# Patient Record
Sex: Female | Born: 1970 | Race: Black or African American | Hispanic: No | Marital: Married | Smoking: Former smoker
Health system: Southern US, Community
[De-identification: ages and names within clinical notes are randomized; demographics above are authoritative.]

## PROBLEM LIST (undated history)

## (undated) DIAGNOSIS — J479 Bronchiectasis, uncomplicated: Secondary | ICD-10-CM

## (undated) DIAGNOSIS — M1990 Unspecified osteoarthritis, unspecified site: Secondary | ICD-10-CM

## (undated) DIAGNOSIS — N189 Chronic kidney disease, unspecified: Secondary | ICD-10-CM

## (undated) DIAGNOSIS — D869 Sarcoidosis, unspecified: Secondary | ICD-10-CM

## (undated) DIAGNOSIS — G4730 Sleep apnea, unspecified: Secondary | ICD-10-CM

## (undated) DIAGNOSIS — E611 Iron deficiency: Secondary | ICD-10-CM

## (undated) HISTORY — DX: Sleep apnea, unspecified: G47.30

## (undated) HISTORY — DX: Unspecified osteoarthritis, unspecified site: M19.90

---

## 1993-02-09 HISTORY — PX: TUBAL LIGATION: SHX77

## 2009-09-15 ENCOUNTER — Emergency Department (HOSPITAL_COMMUNITY): Admission: EM | Admit: 2009-09-15 | Discharge: 2009-09-15 | Payer: Self-pay | Admitting: Emergency Medicine

## 2014-01-30 ENCOUNTER — Encounter: Payer: Self-pay | Admitting: Family Medicine

## 2014-02-12 ENCOUNTER — Encounter: Payer: Self-pay | Admitting: Physician Assistant

## 2014-02-12 ENCOUNTER — Ambulatory Visit (INDEPENDENT_AMBULATORY_CARE_PROVIDER_SITE_OTHER): Payer: 59 | Admitting: Physician Assistant

## 2014-02-12 VITALS — BP 126/78 | HR 76 | Temp 98.6°F | Resp 18 | Ht 65.0 in | Wt 157.0 lb

## 2014-02-12 DIAGNOSIS — Z0000 Encounter for general adult medical examination without abnormal findings: Secondary | ICD-10-CM

## 2014-02-12 DIAGNOSIS — F17200 Nicotine dependence, unspecified, uncomplicated: Secondary | ICD-10-CM

## 2014-02-12 DIAGNOSIS — Z720 Tobacco use: Secondary | ICD-10-CM

## 2014-02-12 DIAGNOSIS — Z23 Encounter for immunization: Secondary | ICD-10-CM

## 2014-02-12 DIAGNOSIS — J988 Other specified respiratory disorders: Secondary | ICD-10-CM

## 2014-02-12 DIAGNOSIS — J22 Unspecified acute lower respiratory infection: Secondary | ICD-10-CM

## 2014-02-12 HISTORY — DX: Nicotine dependence, unspecified, uncomplicated: F17.200

## 2014-02-12 MED ORDER — AZITHROMYCIN 250 MG PO TABS: ORAL_TABLET | ORAL | Status: DC

## 2014-02-12 NOTE — Progress Notes (Signed)
Patient ID: Hailey Ross MRN: [id number], DOB: [birth year], 43 y.o. Date of Encounter: 02/12/2014,   Chief Complaint: Physical (CPE)  HPI: 43 y.o. y/o AA female  here for CPE.   She is also being seen as a new patient to establish care with our office.  She states that she has just been going 20 urgent care as needed for any acute medical issues but otherwise has not had any routine medical provider in many many years.  She is a smoker and states that she does want to quit. Says that she has never used any medication to help her quit in the past. Says that she has tried to quit cold [place] but that did not work. Says that her job has a program that she just signed up for it is supposed to help with smoking cessation. She wants to try this program first but says that if she is unsuccessful with smoking cessation with this then she will follow-up with me.  She reports that she has had a cough productive of thick sputum for the past couple weeks. Has had no fevers or chills. No night sweats. No hemoptysis. Nasal congestion or mucus from the nose.  No other complaints or concerns.   Review of Systems: Consitutional: No fever, chills, fatigue, night sweats, lymphadenopathy. No significant/unexplained weight changes. Eyes: No visual changes, eye redness, or discharge. ENT/Mouth: No ear pain, sore throat, nasal drainage, or sinus pain. Cardiovascular: No chest pressure,heaviness, tightness or squeezing, even with exertion. No increased shortness of breath or dyspnea on exertion.No palpitations, edema, orthopnea, PND. Respiratory: No  hemoptysis, SOB, or wheezing. Gastrointestinal: No anorexia, dysphagia, reflux, pain, nausea, vomiting, hematemesis, diarrhea, constipation, BRBPR, or melena. Breast: No mass, nodules, bulging, or retraction. No skin changes or inflammation. No nipple discharge. No lymphadenopathy. Genitourinary: No dysuria, hematuria, incontinence, vaginal discharge,  pruritis, burning, abnormal bleeding, or pain. Musculoskeletal: No decreased ROM, No joint pain or swelling. No significant pain in neck, back, or extremities. Skin: No rash, pruritis, or concerning lesions. Neurological: No headache, dizziness, syncope, seizures, tremors, memory loss, coordination problems, or paresthesias. Psychological: No anxiety, depression, hallucinations, SI/HI. Endocrine: No polydipsia, polyphagia, polyuria, or known diabetes.No increased fatigue. No palpitations/rapid heart rate. No significant/unexplained weight change. All other systems were reviewed and are otherwise negative.  History reviewed. No pertinent past medical history.   Past Surgical History  Procedure Laterality Date  . Tubal ligation  02/1993    Home Meds:  No outpatient prescriptions prior to visit.   No facility-administered medications prior to visit.    Allergies: No Known Allergies  History   Social History  . Marital Status: Single    Spouse Name: N/A    Number of Children: N/A  . Years of Education: N/A   Occupational History  . Not on file.   Social History Main Topics  . Smoking status: Current Every Day Smoker -- 1.00 packs/day for 20 years    Types: Cigarettes  . Smokeless tobacco: Never Used  . Alcohol Use: 0.0 oz/week    0 Not specified per week     Comment: Rare  . Drug Use: No  . Sexual Activity: Not Currently   Other Topics Concern  . Not on file   Social History Narrative   3 children. 2 are at home. 1 is at college.  Ages 5321, 8722, [age].   At Home: It is her and her 2 children.   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    Family History  Problem Relation Age of Onset  . Hypertension Mother     Physical Exam: Blood pressure 126/78, pulse 76, temperature 98.6 F (37 C), temperature source Oral, resp. rate 18, height 5\' 5"  (1.651 m), weight 157 lb (71.215 kg), last menstrual period 02/07/2014.,  Body mass index is 26.13 kg/(m^2). General: Well developed, well nourished, AAF. Appears in no acute distress. HEENT: Normocephalic, atraumatic. Conjunctiva pink, sclera non-icteric. Pupils 2 mm constricting to 1 mm, round, regular, and equally reactive to light and accomodation. EOMI. Internal auditory canal clear. TMs with good cone of light and without pathology. Nasal mucosa pink. Nares are without discharge. No sinus tenderness. Oral mucosa pink.  Pharynx without exudate.   Neck: Supple. Trachea midline. No thyromegaly. Full ROM. No lymphadenopathy.No Carotid Bruits. Lungs: Clear to auscultation bilaterally without wheezes, rales, or rhonchi. Breathing is of normal effort and unlabored. Cardiovascular: RRR with S1 S2. No murmurs, rubs, or gallops. Distal pulses 2+ symmetrically. No carotid or abdominal bruits. Breast: Symmetrical. No masses. Nipples without discharge. Abdomen: Soft, non-tender, non-distended with normoactive bowel sounds. No hepatosplenomegaly or masses. No rebound/guarding. No CVA tenderness. No hernias.  Genitourinary:  External genitalia without lesions. Vaginal mucosa pink.No discharge present. Cervix pink and without discharge. No cervical tenderness.Normal uterus size. No adnexal mass or tenderness.  Pap smear taken. Musculoskeletal: Full range of motion and 5/5 strength throughout. Without swelling, atrophy, tenderness, crepitus, or warmth. Extremities without clubbing, cyanosis, or edema. Calves supple. Skin: Warm and moist without erythema, ecchymosis, wounds, or rash. Neuro: A+Ox3. CN II-XII grossly intact. Moves all extremities spontaneously. Full sensation throughout. Normal gait. DTR 2+ throughout upper and lower extremities. Finger to nose intact. Psych:  Responds to questions appropriately with a normal affect.   Assessment/Plan:  43 y.o. y/o female here for CPE 1. Visit for preventive health examination  A. Screening Labs: She states that she can return  fasting for lab work within the next week. - CBC with Differential; Future - COMPLETE METABOLIC PANEL WITH GFR; Future - Lipid panel; Future - TSH; Future  B. Pap: - PAP, Thin Prep w/HPV rflx HPV Type 16/18   C. Screening Mammogram: She states that they have screening mammograms at her work once a year and that she will get a mammogram there.  D. DEXA/BMD:  Can wait until closer to age 43 to start this. Only risk factor for osteoporosis is smoking.  E. Colorectal Cancer Screening: Not indicated to start this until age 43.  F. Immunizations:  Influenza: Today I recommended influenza vaccine but she defers. Tetanus: she states that she has not had a tetanus vaccine in > 10 years. She is agreeable to receive this today. Pneumococcal: Given her smoking, she needs a Pneumovax 23. She is agreeable to receive this today. She will need no no other pneumonia vaccine until age 43. Zostavax: discuss at age 43.   2. Smoker See history of present illness regarding cessation.  3. Lower respiratory tract infection - azithromycin (ZITHROMAX) 250 MG tablet; Day 1: Take 2 daily. Days 2-5: Take 1 daily.  Dispense: 6 tablet; Refill: 0 She is to take the Z-Pak as directed. If cough does not resolve after completion of this, then she needs to follow-up and would obtain up to chest x-ray especially given her smoking history.  4. Need for prophylactic vaccination against Streptococcus pneumoniae (pneumococcus) - Pneumococcal polysaccharide vaccine 23-valent greater than or equal to 2yo subcutaneous/IM  5. Need for prophylactic vaccination with  combined diphtheria-tetanus-pertussis (DTP) vaccine - Tdap vaccine greater than or equal to 7yo IM   Signed, [address] [place], [place], [facility] 02/12/2014 3:44 PM

## 2014-02-14 LAB — PAP, THIN PREP W/HPV RFLX HPV TYPE 16/18: HPV DNA High Risk: NOT DETECTED

## 2014-02-17 ENCOUNTER — Other Ambulatory Visit: Payer: 59

## 2014-02-17 DIAGNOSIS — Z0000 Encounter for general adult medical examination without abnormal findings: Secondary | ICD-10-CM

## 2014-02-17 LAB — COMPLETE METABOLIC PANEL WITH GFR
ALBUMIN: 3.3 g/dL — AB (ref 3.5–5.2)
ALT: 14 U/L (ref 0–35)
AST: 26 U/L (ref 0–37)
Alkaline Phosphatase: 130 U/L — ABNORMAL HIGH (ref 39–117)
BUN: 11 mg/dL (ref 6–23)
CALCIUM: 9.1 mg/dL (ref 8.4–10.5)
CHLORIDE: 104 meq/L (ref 96–112)
CO2: 27 meq/L (ref 19–32)
CREATININE: 1.17 mg/dL — AB (ref 0.50–1.10)
GFR, Est African American: 66 mL/min
GFR, Est Non African American: 57 mL/min — ABNORMAL LOW
Glucose, Bld: 77 mg/dL (ref 70–99)
POTASSIUM: 4.6 meq/L (ref 3.5–5.3)
SODIUM: 137 meq/L (ref 135–145)
TOTAL PROTEIN: 7 g/dL (ref 6.0–8.3)
Total Bilirubin: 0.7 mg/dL (ref 0.2–1.2)

## 2014-02-17 LAB — CBC WITH DIFFERENTIAL/PLATELET
Basophils Absolute: 0 10*3/uL (ref 0.0–0.1)
Basophils Relative: 0 % (ref 0–1)
Eosinophils Absolute: 0.2 10*3/uL (ref 0.0–0.7)
Eosinophils Relative: 2 % (ref 0–5)
HCT: 39.4 % (ref 36.0–46.0)
Hemoglobin: 13 g/dL (ref 12.0–15.0)
LYMPHS ABS: 3 10*3/uL (ref 0.7–4.0)
LYMPHS PCT: 29 % (ref 12–46)
MCH: 27.2 pg (ref 26.0–34.0)
MCHC: 33 g/dL (ref 30.0–36.0)
MCV: 82.4 fL (ref 78.0–100.0)
Monocytes Absolute: 0.9 10*3/uL (ref 0.1–1.0)
Monocytes Relative: 9 % (ref 3–12)
NEUTROS PCT: 60 % (ref 43–77)
Neutro Abs: 6.1 10*3/uL (ref 1.7–7.7)
PLATELETS: 328 10*3/uL (ref 150–400)
RBC: 4.78 MIL/uL (ref 3.87–5.11)
RDW: 14.5 % (ref 11.5–15.5)
WBC: 10.2 10*3/uL (ref 4.0–10.5)

## 2014-02-17 LAB — LIPID PANEL
Cholesterol: 176 mg/dL (ref 0–200)
HDL: 34 mg/dL — ABNORMAL LOW (ref >39)
LDL Cholesterol: 120 mg/dL — ABNORMAL HIGH (ref 0–99)
Total CHOL/HDL Ratio: 5.2 Ratio
Triglycerides: 112 mg/dL (ref <150)
VLDL: 22 mg/dL (ref 0–40)

## 2014-02-17 LAB — TSH: TSH: 1.622 u[IU]/mL (ref 0.350–4.500)

## 2014-02-18 ENCOUNTER — Telehealth: Payer: Self-pay | Admitting: Family Medicine

## 2014-02-18 ENCOUNTER — Encounter: Payer: Self-pay | Admitting: Family Medicine

## 2014-02-18 DIAGNOSIS — A5901 Trichomonal vulvovaginitis: Secondary | ICD-10-CM

## 2014-02-18 MED ORDER — METRONIDAZOLE 500 MG PO TABS: 2000.0000 mg | ORAL_TABLET | Freq: Once | ORAL | Status: DC

## 2014-02-18 NOTE — Telephone Encounter (Signed)
Pt aware of lab results and provider recommendations.  Rx to pharmacy.  Pt states she has not had any sexual contact in several years.  [facility]

## 2014-02-18 NOTE — Telephone Encounter (Signed)
-----   Message from Dorena BodoMary B Dixon, PA-C sent at 02/17/2014  8:05 AM EST ----- Pap smear shows trichomonas. Explain to patient that this is sexually transmitted and that she needs to notify any recent sexual partners and they need to be evaluated and treated and they need to abstain from sex until they are treated.  As well, she needs to abstain from sex  until she and partners are treated. She needs to take the following medication to get rid of this infection. Order prescription for metronidazole 500 mg pills Take 4 pills 1 dose Dispense #4 with 0 refills Notify the health department.

## 2015-01-29 ENCOUNTER — Other Ambulatory Visit: Payer: Self-pay | Admitting: Family Medicine

## 2015-01-29 ENCOUNTER — Ambulatory Visit
Admission: RE | Admit: 2015-01-29 | Discharge: 2015-01-29 | Disposition: A | Discharge status: Home / Self Care | Payer: 59 | Source: Ambulatory Visit | Attending: Physician Assistant | Admitting: Physician Assistant

## 2015-01-29 ENCOUNTER — Encounter: Payer: Self-pay | Admitting: Physician Assistant

## 2015-01-29 ENCOUNTER — Ambulatory Visit (INDEPENDENT_AMBULATORY_CARE_PROVIDER_SITE_OTHER): Payer: 59 | Admitting: Physician Assistant

## 2015-01-29 VITALS — BP 106/76 | HR 64 | Temp 98.4°F | Resp 18 | Wt 150.0 lb

## 2015-01-29 DIAGNOSIS — F17200 Nicotine dependence, unspecified, uncomplicated: Secondary | ICD-10-CM

## 2015-01-29 DIAGNOSIS — R05 Cough: Secondary | ICD-10-CM | POA: Diagnosis not present

## 2015-01-29 DIAGNOSIS — R9389 Abnormal findings on diagnostic imaging of other specified body structures: Secondary | ICD-10-CM

## 2015-01-29 DIAGNOSIS — Z720 Tobacco use: Secondary | ICD-10-CM | POA: Diagnosis not present

## 2015-01-29 DIAGNOSIS — R059 Cough, unspecified: Secondary | ICD-10-CM

## 2015-01-29 MED ORDER — BECLOMETHASONE DIPROPIONATE 80 MCG/ACT IN AERS: 1.0000 | INHALATION_SPRAY | Freq: Two times a day (BID) | RESPIRATORY_TRACT | Status: DC

## 2015-01-29 MED ORDER — AZITHROMYCIN 250 MG PO TABS: ORAL_TABLET | ORAL | Status: DC

## 2015-01-29 MED ORDER — ALBUTEROL SULFATE (2.5 MG/3ML) 0.083% IN NEBU: 2.5000 mg | INHALATION_SOLUTION | Freq: Four times a day (QID) | RESPIRATORY_TRACT | Status: DC | PRN

## 2015-01-29 MED ORDER — VARENICLINE TARTRATE 1 MG PO TABS: 1.0000 mg | ORAL_TABLET | Freq: Two times a day (BID) | ORAL | Status: DC

## 2015-01-29 MED ORDER — ALBUTEROL SULFATE HFA 108 (90 BASE) MCG/ACT IN AERS: 2.0000 | INHALATION_SPRAY | Freq: Four times a day (QID) | RESPIRATORY_TRACT | Status: DC | PRN

## 2015-01-29 MED ORDER — VARENICLINE TARTRATE 0.5 MG PO TABS: 0.5000 mg | ORAL_TABLET | Freq: Two times a day (BID) | ORAL | Status: DC

## 2015-01-29 NOTE — Progress Notes (Signed)
Patient ID: Hailey FernsKimberly R Daniels MRN: [id number], DOB: [birth year], 44 y.o. Date of Encounter: @DATE @  Chief Complaint:  Chief Complaint  Patient presents with  . unresolved cough    HPI: 44 y.o. year old AA female  presents with above complaint.   States that she has had this problem ever since her last visit here. I reviewed that her last visit here was 02/12/2014. She says, yes, that's correct-- that this has been going on for almost a year now. She says that she does not have a cough every single day, but, instead, seems to have  "episodes" of coughing. Says that she will go through these episodes of coughing for a couple of days, then it will improve for several days, then recur--and continues off & on, like that. Says that sometimes she is able to cough up some phlegm but not every time. Says that she never goes a whole week without some amount of coughing. Has had no fevers or chills. No hemoptysis. No weight loss. No night sweats.  She does smoke 1 pack per day and has smoked for about 20 years now.  Says that she lives with her mom and son and they both smoke as well.  Says that she works 8 hours a day and is no longer smoking at work. Says that she was able to stop smoking at work on her own. Says that, at work, they have to go outside to smoke and so over the winter when it was very cold, she did not want to go out to smoke so she did not. Once the weather warmed up, she had gotten out of the habit of going out there, so she just continued to not go out to smoke. Therefore,  she knows she can go without it and she is interested in trying to quit.   History reviewed. No pertinent past medical history.   Home Meds: Outpatient Prescriptions Prior to Visit  Medication Sig Dispense Refill  . naproxen sodium (ANAPROX) 220 MG tablet Take 220 mg by mouth as needed.    Marland Kitchen. azithromycin (ZITHROMAX) 250 MG tablet Day 1: Take 2 daily. Days 2-5: Take 1 daily. 6 tablet 0  . metroNIDAZOLE  (FLAGYL) 500 MG tablet Take 4 tablets (2,000 mg total) by mouth once. 4 tablet 0   No facility-administered medications prior to visit.    Allergies: No Known Allergies  Social History   Social History  . Marital Status: Single    Spouse Name: N/A  . Number of Children: N/A  . Years of Education: N/A   Occupational History  . Not on file.   Social History Main Topics  . Smoking status: Current Every Day Smoker -- 1.00 packs/day for 20 years    Types: Cigarettes  . Smokeless tobacco: Never Used  . Alcohol Use: 0.0 oz/week    0 Standard drinks or equivalent per week     Comment: Rare  . Drug Use: No  . Sexual Activity: Not Currently   Other Topics Concern  . Not on file   Social History Narrative   3 children. 2 are at home. 1 is at college.    At Home: It is her and her 2 children.   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    Family History  Problem Relation Age of Onset  . Hypertension Mother      Review of Systems:  See HPI for pertinent ROS. All  other ROS negative.    Physical Exam: Blood pressure 106/76, pulse 64, temperature 98.4 F (36.9 C), temperature source Oral, resp. rate 18, weight 150 lb (68.04 kg)., Body mass index is 24.96 kg/(m^2). General: WNWD AAF. Appears in no acute distress. Neck: Supple. No thyromegaly. No lymphadenopathy. Lungs: Clear bilaterally to auscultation without wheezes, rales, or rhonchi. Breathing is unlabored. Heart: RRR with S1 S2. No murmurs, rubs, or gallops. Musculoskeletal:  Strength and tone normal for age. Extremities/Skin: Warm and dry.  Neuro: Alert and oriented X 3. Moves all extremities spontaneously. Gait is normal. CNII-XII grossly in tact. Psych:  Responds to questions appropriately with a normal affect.     ASSESSMENT AND PLAN:  44 y.o. year old female with   1. Smoker - DG Chest 2 View; Future - varenicline (CHANTIX) 0.5 MG tablet; Take 1 tablet  (0.5 mg total) by mouth 2 (two) times daily.  Dispense: 30 tablet; Refill: 0 - varenicline (CHANTIX CONTINUING MONTH PAK) 1 MG tablet; Take 1 tablet (1 mg total) by mouth 2 (two) times daily.  Dispense: 60 tablet; Refill: 3 - albuterol (PROVENTIL HFA;VENTOLIN HFA) 108 (90 BASE) MCG/ACT inhaler; Inhale 2 puffs into the lungs every 6 (six) hours as needed for wheezing or shortness of breath.  Dispense: 1 Inhaler; Refill: 0  2. Cough - DG Chest 2 View; Future - varenicline (CHANTIX) 0.5 MG tablet; Take 1 tablet (0.5 mg total) by mouth 2 (two) times daily.  Dispense: 30 tablet; Refill: 0 - varenicline (CHANTIX CONTINUING MONTH PAK) 1 MG tablet; Take 1 tablet (1 mg total) by mouth 2 (two) times daily.  Dispense: 60 tablet; Refill: 3 - albuterol (PROVENTIL HFA;VENTOLIN HFA) 108 (90 BASE) MCG/ACT inhaler; Inhale 2 puffs into the lungs every 6 (six) hours as needed for wheezing or shortness of breath.  Dispense: 1 Inhaler; Refill: 0   I gave and reviewed a handout with tips for helping with smoking cessation.  Also told her to discuss the fact that she is going to try to quit smoking with her mom and son. Discussed that it is going to be extremely difficult for her to quit smoking if they are continuing to smoke right around her and keeping cigarettes around her. She is agreeable to go get a chest x-ray today. She is to use albuterol inhaler 2 puffs every 4 hours PRN "cough" Also discussed proper use of the Chantix and she is to start with the starting dose of 0.5 and then when she completes this go up to the continuing Dosepaks. If she has any adverse effects with this and she is to stop the medicine and call us.   Signed, [address] [place], [place], [facility] 01/29/2015 3:01 PM

## 2015-01-30 ENCOUNTER — Telehealth: Payer: Self-pay | Admitting: Physician Assistant

## 2015-01-30 NOTE — Telephone Encounter (Signed)
Pt has a question about a prescription that was given to her yesterday.  Please call [phone]

## 2015-01-30 NOTE — Telephone Encounter (Signed)
Pt called and meds clarified

## 2015-03-04 ENCOUNTER — Ambulatory Visit
Admission: RE | Admit: 2015-03-04 | Discharge: 2015-03-04 | Disposition: A | Discharge status: Home / Self Care | Payer: 59 | Source: Ambulatory Visit | Attending: Physician Assistant | Admitting: Physician Assistant

## 2015-03-04 ENCOUNTER — Other Ambulatory Visit: Payer: Self-pay | Admitting: Physician Assistant

## 2015-03-04 ENCOUNTER — Other Ambulatory Visit: Payer: Self-pay | Admitting: *Deleted

## 2015-03-04 DIAGNOSIS — R05 Cough: Secondary | ICD-10-CM

## 2015-03-04 DIAGNOSIS — R059 Cough, unspecified: Secondary | ICD-10-CM

## 2015-03-04 DIAGNOSIS — R9389 Abnormal findings on diagnostic imaging of other specified body structures: Secondary | ICD-10-CM

## 2015-03-09 ENCOUNTER — Other Ambulatory Visit: Payer: Self-pay | Admitting: Family Medicine

## 2015-03-09 DIAGNOSIS — R9389 Abnormal findings on diagnostic imaging of other specified body structures: Secondary | ICD-10-CM

## 2015-03-09 NOTE — Progress Notes (Signed)
Chest x-ray showing up in over due results folder. At last visit patient had been having cough for one year. Also smoker. Call her remind her to go for the chest x-ray and encourage her to do this.

## 2015-03-18 ENCOUNTER — Ambulatory Visit
Admission: RE | Admit: 2015-03-18 | Discharge: 2015-03-18 | Disposition: A | Discharge status: Home / Self Care | Payer: 59 | Source: Ambulatory Visit | Attending: Physician Assistant | Admitting: Physician Assistant

## 2015-03-18 DIAGNOSIS — R9389 Abnormal findings on diagnostic imaging of other specified body structures: Secondary | ICD-10-CM

## 2015-03-19 ENCOUNTER — Other Ambulatory Visit: Payer: Self-pay | Admitting: Family Medicine

## 2015-03-19 DIAGNOSIS — R053 Chronic cough: Secondary | ICD-10-CM

## 2015-03-19 DIAGNOSIS — R05 Cough: Secondary | ICD-10-CM

## 2015-03-19 DIAGNOSIS — R9389 Abnormal findings on diagnostic imaging of other specified body structures: Secondary | ICD-10-CM

## 2015-03-26 ENCOUNTER — Other Ambulatory Visit: Payer: Self-pay | Admitting: Family Medicine

## 2015-03-26 DIAGNOSIS — R059 Cough, unspecified: Secondary | ICD-10-CM

## 2015-03-26 DIAGNOSIS — R05 Cough: Secondary | ICD-10-CM

## 2015-03-26 DIAGNOSIS — F17200 Nicotine dependence, unspecified, uncomplicated: Secondary | ICD-10-CM

## 2015-03-26 MED ORDER — BECLOMETHASONE DIPROPIONATE 80 MCG/ACT IN AERS: 1.0000 | INHALATION_SPRAY | Freq: Two times a day (BID) | RESPIRATORY_TRACT | Status: DC

## 2015-03-26 MED ORDER — ALBUTEROL SULFATE HFA 108 (90 BASE) MCG/ACT IN AERS: 2.0000 | INHALATION_SPRAY | Freq: Four times a day (QID) | RESPIRATORY_TRACT | Status: DC | PRN

## 2015-03-26 NOTE — Telephone Encounter (Signed)
Medication refilled per protocol. 

## 2015-04-21 ENCOUNTER — Ambulatory Visit (INDEPENDENT_AMBULATORY_CARE_PROVIDER_SITE_OTHER)
Admission: RE | Admit: 2015-04-21 | Discharge: 2015-04-21 | Disposition: A | Discharge status: Home / Self Care | Payer: 59 | Source: Ambulatory Visit | Attending: Pulmonary Disease | Admitting: Pulmonary Disease

## 2015-04-21 ENCOUNTER — Encounter: Payer: Self-pay | Admitting: Pulmonary Disease

## 2015-04-21 ENCOUNTER — Ambulatory Visit (INDEPENDENT_AMBULATORY_CARE_PROVIDER_SITE_OTHER): Payer: 59 | Admitting: Pulmonary Disease

## 2015-04-21 ENCOUNTER — Other Ambulatory Visit (INDEPENDENT_AMBULATORY_CARE_PROVIDER_SITE_OTHER): Payer: 59

## 2015-04-21 VITALS — BP 112/74 | HR 71 | Ht 65.0 in | Wt 163.0 lb

## 2015-04-21 DIAGNOSIS — Z23 Encounter for immunization: Secondary | ICD-10-CM

## 2015-04-21 DIAGNOSIS — R053 Chronic cough: Secondary | ICD-10-CM

## 2015-04-21 DIAGNOSIS — R05 Cough: Secondary | ICD-10-CM

## 2015-04-21 LAB — CBC WITH DIFFERENTIAL/PLATELET
Basophils Absolute: 0 10*3/uL (ref 0.0–0.1)
Basophils Relative: 0.4 % (ref 0.0–3.0)
EOS ABS: 0.1 10*3/uL (ref 0.0–0.7)
Eosinophils Relative: 1 % (ref 0.0–5.0)
HCT: 41 % (ref 36.0–46.0)
HEMOGLOBIN: 13.3 g/dL (ref 12.0–15.0)
Lymphocytes Relative: 34.1 % (ref 12.0–46.0)
Lymphs Abs: 3.9 10*3/uL (ref 0.7–4.0)
MCHC: 32.5 g/dL (ref 30.0–36.0)
MCV: 83.1 fl (ref 78.0–100.0)
MONO ABS: 0.9 10*3/uL (ref 0.1–1.0)
Monocytes Relative: 7.9 % (ref 3.0–12.0)
Neutro Abs: 6.4 10*3/uL (ref 1.4–7.7)
Neutrophils Relative %: 56.6 % (ref 43.0–77.0)
Platelets: 346 10*3/uL (ref 150.0–400.0)
RBC: 4.93 Mil/uL (ref 3.87–5.11)
RDW: 13.7 % (ref 11.5–15.5)
WBC: 11.4 10*3/uL — AB (ref 4.0–10.5)

## 2015-04-21 NOTE — Progress Notes (Deleted)
   Subjective:    Patient ID: Hailey Ross, female    DOB: 01-23-1971, 45 y.o.   MRN: [id number]  HPI    Review of Systems  Constitutional: Positive for unexpected weight change. Negative for fever.  HENT: Positive for congestion and dental problem. Negative for ear pain, nosebleeds, postnasal drip, rhinorrhea, sinus pressure, sneezing, sore throat and trouble swallowing.   Eyes: Negative for redness and itching.  Respiratory: Positive for cough and shortness of breath. Negative for chest tightness and wheezing.   Cardiovascular: Negative for palpitations and leg swelling.  Gastrointestinal: Positive for abdominal pain. Negative for nausea and vomiting.  Genitourinary: Negative for dysuria.  Musculoskeletal: Negative for joint swelling.  Skin: Negative for rash.  Neurological: Negative for headaches.  Hematological: Does not bruise/bleed easily.  Psychiatric/Behavioral: Negative for dysphoric mood. The patient is not nervous/anxious.        Objective:   Physical Exam        Assessment & Plan:

## 2015-04-21 NOTE — Progress Notes (Signed)
Past medical history She  has no past medical history on file.  Past surgical history She  has past surgical history that includes Tubal ligation (02/1993).  Family history Her family history includes Hypertension in her mother.  Social history She  reports that she quit smoking about 4 weeks ago. Her smoking use included Cigarettes. She has a 20 pack-year smoking history. She has never used smokeless tobacco. She reports that she drinks alcohol. She reports that she does not use illicit drugs.  No Known Allergies  Current Outpatient Prescriptions on File Prior to Visit  Medication Sig  . albuterol (PROVENTIL HFA;VENTOLIN HFA) 108 (90 BASE) MCG/ACT inhaler Inhale 2 puffs into the lungs every 6 (six) hours as needed for wheezing or shortness of breath.  . beclomethasone (QVAR) 80 MCG/ACT inhaler Inhale 1 puff into the lungs 2 (two) times daily.  . naproxen sodium (ANAPROX) 220 MG tablet Take 220 mg by mouth as needed.   No current facility-administered medications on file prior to visit.   Review of systems Review of Systems  Constitutional: Positive for unexpected weight change. Negative for fever.  HENT: Positive for congestion and dental problem. Negative for ear pain, nosebleeds, postnasal drip, rhinorrhea, sinus pressure, sneezing, sore throat and trouble swallowing.   Eyes: Negative for redness and itching.  Respiratory: Positive for cough and shortness of breath. Negative for chest tightness and wheezing.   Cardiovascular: Negative for palpitations and leg swelling.  Gastrointestinal: Positive for abdominal pain. Negative for nausea and vomiting.  Genitourinary: Negative for dysuria.  Musculoskeletal: Negative for joint swelling.  Skin: Negative for rash.  Neurological: Negative for headaches.  Hematological: Does not bruise/bleed easily.  Psychiatric/Behavioral: Negative for dysphoric mood. The patient is not nervous/anxious.     Chief Complaint  Patient presents with   . Pulmonary Consult    Referred by Dr Doren Custard for cough. Pt had recent abn CT    Tests CT chest 03/18/15 >> biapical thickening with traction BTX, BTX RML and lingula, subcarinal LAN 1.7 cm, patchy increased interstitial markings  Vital signs BP 112/74 mmHg  Pulse 71  Ht 5' 5" (1.651 m)  Wt 163 lb (73.936 kg)  BMI 27.12 kg/m2  SpO2 97%  History of present illness MINERVA BLUETT is a 45 y.o. female former smoker for evaluation of chronic cough and abnormal CT chest.  She has an extensive history of smoking.  She quit about 4 weeks ago because of progressive cough.  Her cough started several months ago.  She was bringing up clear to yellow sputum.  She denies hemoptysis or fever.  She was losing weight when her cough first started, but her weights has started to pick up again.  She would get some wheezing.  She denied chest pain, sinus congestion, skin rash, or joint swelling.  Her cough has improved since she stopped smoking.  She was prescribed Qvar and Ventolin >> these helped more when she was smoking, but not so much now.  She denies any prior history of pneumonia.  There is no hx of exposure to tuberculosis.  She denies animal/bird exposure.  She is from [place].  She works at [employer].  She used to work in a Therapist, music about 20 years ago.   She denies any recent sick exposures, or travel history.  There is no prior history of asthma, or allergies.   Physical exam  General - No distress ENT - No sinus tenderness, no oral exudate, no LAN, no  thyromegaly, TM clear, pupils equal/reactive Cardiac - s1s2 regular, no murmur, pulses symmetric Chest - faint inspiratory squeaks and crackles, no wheeze Back - No focal tenderness Abd - Soft, non-tender, no organomegaly, + bowel sounds Ext - No edema, clubbing, or cyanosis Neuro - Normal strength, cranial nerves intact Skin - No rashes Psych - Normal mood, and behavior   Ct Chest W  Contrast  03/18/2015  CLINICAL DATA:  Productive cough several months with 10 pound weight loss over 2-3 months and occasional shortness of breath. Completed antibiotics 02/2015. Abnormal chest x-ray November 2016. Active smoker. EXAM: CT CHEST WITH CONTRAST TECHNIQUE: Multidetector CT imaging of the chest was performed during intravenous contrast administration. CONTRAST:  75 mL Isovue-300 IV COMPARISON:  Chest x-ray 01/29/2015 and 03/04/2015 FINDINGS: Lungs are adequately inflated and demonstrate mild biapical pleural thickening with moderate biapical traction bronchiectasis left worse than right. There is mild patchy peripheral increased interstitial markings over the mid to lower lungs with mild patchy hazy attenuation over the mid to lower lungs with additional areas of bronchiectasis over the right middle lobe and lingula. No evidence of effusion. Subtle focal opacification over the posterior right lower lobe. Central airways are within normal. Heart is normal in size. Mild ectasia of the ascending thoracic aorta measuring 3.6 cm in AP diameter. Minimal calcified plaque over the aortic arch. Pulmonary arterial system is within normal. There is mild mediastinal bilateral hilar adenopathy with the largest node in the subcarinal region measuring 1.7 cm by short axis. No axillary adenopathy. Remaining mediastinal structures are within normal. Images through the upper abdomen demonstrate evidence of cholelithiasis. There is a sub cm hypodensity over the mid to lower pole cortex of the left kidney likely a cyst but too small to characterize. There is mild low density heterogeneity of the spleen which may be perfusion related although cannot exclude underlying infiltrative process. IMPRESSION: Patchy low-attenuation over the mid to lower lungs with mild peripheral interstitial changes and areas of traction bronchiectasis most prominent over the apices. Focal peripheral opacification over the posterior right lower  lobe. Mediastinal and bilateral hilar adenopathy. Findings may be due to underlying interstitial disease, atypical infection or granulomatous disease such as sarcoidosis. Consider pulmonary consultation as high-resolution chest CT may be helpful for further characterization of this interstitial process. Splenic heterogeneity which may be related to perfusion, although cannot exclude underlying infiltrative process which could be related to the lung disease described above as seen with atypical infection or sarcoidosis. Cholelithiasis. Sub cm left renal cortical hypodensity likely a cyst but too small to characterize. Mild ectasia of the ascending thoracic aorta measuring 3.6 cm in AP diameter. Recommend annual imaging followup by CTA or MRA. This recommendation follows 2010 ACCF/AHA/AATS/ACR/ASA/SCA/SCAI/SIR/STS/SVM Guidelines for the Diagnosis and Management of Patients with Thoracic Aortic Disease. Circulation.2010; 121: J009-F818. Electronically Signed   By: Marin Olp M.D.   On: 03/18/2015 10:36    Lab Results  Component Value Date   WBC 10.2 02/17/2014   HGB 13.0 02/17/2014   HCT 39.4 02/17/2014   MCV 82.4 02/17/2014   PLT 328 02/17/2014    Lab Results  Component Value Date   CREATININE 1.17* 02/17/2014   BUN 11 02/17/2014   NA 137 02/17/2014   K 4.6 02/17/2014   CL 104 02/17/2014   CO2 27 02/17/2014    Lab Results  Component Value Date   ALT 14 02/17/2014   AST 26 02/17/2014   ALKPHOS 130* 02/17/2014   BILITOT 0.7 02/17/2014    Lab  Results  Component Value Date   TSH 1.622 02/17/2014    Discussion She has chronic cough, and CT chest findings as detailed above.  Main concern is that she has history of atypical infection or inflammatory process such as sarcoidosis.  She has history of smoking, and I have encouraged her to maintain her smoking abstinence.  She has not noticed as much benefit from inhaler therapy since she stopped smoking.    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 Plan: - repeat chest xray - check sputum culture, AFB, fungal culture - check quantiferon gold - check ESR, RF, ANA, ACE, HIV, CBC with diff, CMET - will schedule PFTs - can continue Qvar and prn albuterol for now - might need bronchoscopy to further assess   Patient Instructions  Will arrange for chest xray, lab tests, sputum sampling, and pulmonary function testing  Follow up in 3 to 4 weeks with Dr. Halford Chessman or Tammy Parrett     Chesley Mires, MD [place] Pager:  [phone]

## 2015-04-21 NOTE — Patient Instructions (Signed)
Will arrange for chest xray, lab tests, sputum sampling, and pulmonary function testing  Follow up in 3 to 4 weeks with Dr. Craige CottaSood [facility]

## 2015-04-22 LAB — COMPREHENSIVE METABOLIC PANEL
ALK PHOS: 79 U/L (ref 39–117)
ALT: 17 U/L (ref 0–35)
AST: 24 U/L (ref 0–37)
Albumin: 3.8 g/dL (ref 3.5–5.2)
BUN: 19 mg/dL (ref 6–23)
CHLORIDE: 105 meq/L (ref 96–112)
CO2: 27 mEq/L (ref 19–32)
Calcium: 9.5 mg/dL (ref 8.4–10.5)
Creatinine, Ser: 1.24 mg/dL — ABNORMAL HIGH (ref 0.40–1.20)
GFR: 60.16 mL/min (ref >60.00)
GLUCOSE: 85 mg/dL (ref 70–99)
POTASSIUM: 4.2 meq/L (ref 3.5–5.1)
SODIUM: 138 meq/L (ref 135–145)
TOTAL PROTEIN: 7.3 g/dL (ref 6.0–8.3)
Total Bilirubin: 0.3 mg/dL (ref 0.2–1.2)

## 2015-04-22 LAB — HIV ANTIBODY (ROUTINE TESTING W REFLEX): HIV: NONREACTIVE

## 2015-04-30 ENCOUNTER — Telehealth: Payer: Self-pay | Admitting: Pulmonary Disease

## 2015-04-30 DIAGNOSIS — R05 Cough: Secondary | ICD-10-CM

## 2015-04-30 DIAGNOSIS — R053 Chronic cough: Secondary | ICD-10-CM

## 2015-04-30 NOTE — Telephone Encounter (Signed)
Please inform pt that lab did not send quantiferon gold, ACE level, rheumatoid factor, ANA, sedimentation rate as was ordered.  She will need to come in for additional lab draw to send for these tests.  Please also confirm that she will be sending sputum specimens for culture, AFB, and fungal culture.

## 2015-05-01 ENCOUNTER — Other Ambulatory Visit (INDEPENDENT_AMBULATORY_CARE_PROVIDER_SITE_OTHER): Payer: 59

## 2015-05-01 DIAGNOSIS — R05 Cough: Secondary | ICD-10-CM | POA: Diagnosis not present

## 2015-05-01 DIAGNOSIS — R053 Chronic cough: Secondary | ICD-10-CM

## 2015-05-01 LAB — SEDIMENTATION RATE: Sed Rate: 32 mm/hr — ABNORMAL HIGH (ref 0–22)

## 2015-05-01 NOTE — Telephone Encounter (Signed)
Noted  

## 2015-05-01 NOTE — Telephone Encounter (Signed)
Pt to come by and have the missed labs drawn today.  Pt also states that she is going to bring her sputum sample by on Monday.  Orders corrected and are ready to be drawn. Will send to [facility] as FYI. Nothing further needed.

## 2015-05-03 LAB — ANA W/REFLEX: ANA: NEGATIVE

## 2015-05-03 LAB — RHEUMATOID FACTOR: Rhuematoid fact SerPl-aCnc: <10 IU/mL (ref 0.0–13.9)

## 2015-05-03 LAB — ANGIOTENSIN CONVERTING ENZYME: ANGIO CONVERT ENZYME: 195 U/L — AB (ref 14–82)

## 2015-05-04 ENCOUNTER — Telehealth: Payer: Self-pay | Admitting: Pulmonary Disease

## 2015-05-04 DIAGNOSIS — J479 Bronchiectasis, uncomplicated: Secondary | ICD-10-CM | POA: Insufficient documentation

## 2015-05-04 DIAGNOSIS — D869 Sarcoidosis, unspecified: Secondary | ICD-10-CM | POA: Insufficient documentation

## 2015-05-04 MED ORDER — PREDNISONE 20 MG PO TABS: 40.0000 mg | ORAL_TABLET | Freq: Every day | ORAL | Status: DC

## 2015-05-04 NOTE — Telephone Encounter (Signed)
CMP Latest Ref Rng 04/21/2015 02/17/2014  Glucose 70 - 99 mg/dL 85 77  BUN 6 - 23 mg/dL 19 11  Creatinine 0.40 - 1.20 mg/dL 1.24(H) 1.17(H)  Sodium 135 - 145 mEq/L 138 137  Potassium 3.5 - 5.1 mEq/L 4.2 4.6  Chloride 96 - 112 mEq/L 105 104  CO2 19 - 32 mEq/L 27 27  Calcium 8.4 - 10.5 mg/dL 9.5 9.1  Total Protein 6.0 - 8.3 g/dL 7.3 7.0  Total Bilirubin 0.2 - 1.2 mg/dL 0.3 0.7  Alkaline Phos 39 - 117 U/L 79 130(H)  AST 0 - 37 U/L 24 26  ALT 0 - 35 U/L 17 14    CBC Latest Ref Rng 04/21/2015 02/17/2014  WBC 4.0 - 10.5 K/uL 11.4(H) 10.2  Hemoglobin 12.0 - 15.0 g/dL 13.3 13.0  Hematocrit 36.0 - 46.0 % 41.0 39.4  Platelets 150.0 - 400.0 K/uL 346.0 328    Labs 04/21/15 >> HIV negative, ESR 32, ACE 195, RF < 10, ANA negative  Results d/w pt.  Explained that her symptoms, CT chest findings, and lab findings are very suggestive of sarcoidosis.  Will give her trial of prednisone 40 mg daily.  She has ROV on 05/25/14 with Tammy Parrett.  Explained that if her symptoms do not improve with steroid therapy, that she would then need bronchoscopy with biopsy versus VATS lung biopsy.

## 2015-05-05 LAB — QUANTIFERON IN TUBE
QFT TB AG MINUS NIL VALUE: 0 IU/mL
QUANTIFERON TB AG VALUE: 0.08 IU/mL
QUANTIFERON TB GOLD: NEGATIVE
Quantiferon Nil Value: 0.08 IU/mL

## 2015-05-05 LAB — QUANTIFERON TB GOLD ASSAY (BLOOD)

## 2015-05-26 ENCOUNTER — Ambulatory Visit (INDEPENDENT_AMBULATORY_CARE_PROVIDER_SITE_OTHER): Payer: 59 | Admitting: Adult Health

## 2015-05-26 ENCOUNTER — Telehealth: Payer: Self-pay | Admitting: Pulmonary Disease

## 2015-05-26 ENCOUNTER — Ambulatory Visit (INDEPENDENT_AMBULATORY_CARE_PROVIDER_SITE_OTHER): Payer: 59 | Admitting: Pulmonary Disease

## 2015-05-26 ENCOUNTER — Encounter: Payer: Self-pay | Admitting: Adult Health

## 2015-05-26 VITALS — BP 128/72 | HR 58 | Ht 65.0 in | Wt 172.8 lb

## 2015-05-26 DIAGNOSIS — Z23 Encounter for immunization: Secondary | ICD-10-CM

## 2015-05-26 DIAGNOSIS — D869 Sarcoidosis, unspecified: Secondary | ICD-10-CM

## 2015-05-26 LAB — PULMONARY FUNCTION TEST
DL/VA % PRED: 73 %
DL/VA: 3.61 ml/min/mmHg/L
DLCO COR: 9.38 ml/min/mmHg
DLCO cor % pred: 36 %
DLCO unc % pred: 36 %
DLCO unc: 9.41 ml/min/mmHg
FEF 25-75 Post: 2.58 L/sec
FEF 25-75 Pre: 1.87 L/sec
FEF2575-%CHANGE-POST: 38 %
FEF2575-%PRED-PRE: 68 %
FEF2575-%Pred-Post: 95 %
FEV1-%Change-Post: 10 %
FEV1-%PRED-PRE: 59 %
FEV1-%Pred-Post: 66 %
FEV1-POST: 1.67 L
FEV1-PRE: 1.51 L
FEV1FVC-%Change-Post: 3 %
FEV1FVC-%Pred-Pre: 103 %
FEV6-%CHANGE-POST: 5 %
FEV6-%PRED-PRE: 58 %
FEV6-%Pred-Post: 62 %
FEV6-POST: 1.88 L
FEV6-PRE: 1.78 L
FEV6FVC-%Change-Post: 0 %
FEV6FVC-%PRED-POST: 102 %
FEV6FVC-%PRED-PRE: 103 %
FVC-%CHANGE-POST: 6 %
FVC-%PRED-POST: 60 %
FVC-%PRED-PRE: 57 %
FVC-POST: 1.88 L
FVC-PRE: 1.78 L
POST FEV6/FVC RATIO: 100 %
PRE FEV1/FVC RATIO: 85 %
Post FEV1/FVC ratio: 88 %
Pre FEV6/FVC Ratio: 100 %
RV % PRED: 44 %
RV: 0.77 L
TLC % PRED: 53 %
TLC: 2.78 L

## 2015-05-26 NOTE — Assessment & Plan Note (Signed)
Abnormal CT with increased interstitial markings, elevated ACE level PFT shows restrictive patten.  She is improved with steroid challenge.  Will slowly taper steroids and hold at  daily .  Return in 4-6 weeks and discuss possible FOB   Plan  Decrease Prednisone  daily for 1 week then  daily for 1 week then  daily -hold at this dose.  Follow up with Dr. Craige Cotta  In 6 weeks and As needed

## 2015-05-26 NOTE — Patient Instructions (Addendum)
Decrease Prednisone  daily for 1 week then  daily for 1 week then  daily -hold at this dose.  Follow up with Dr. Craige Cotta  In 6 weeks and As needed

## 2015-05-26 NOTE — Progress Notes (Signed)
PFT done today. 

## 2015-05-26 NOTE — Progress Notes (Signed)
Subjective:    Patient ID: Hailey Ross, female    DOB: 19-Feb-1971, 45 y.o.   MRN: [id number]  HPI 45 yo female former smoker seen for pulmonary consult 1/101/7 for chronic cough and abnormal CT chest   Tests CT chest 03/18/15 >> biapical thickening with traction BTX, BTX RML and lingula, subcarinal LAN 1.7 cm, patchy increased interstitial markings Labs 04/21/15 >> HIV negative, ESR 32, ACE 195, RF < 10, ANA negative  05/26/2015 Follow up : Cough /Abnormal CT ? Sarcoid  Pt presents for a 3 week follow up .  She was seen last ov for pulmonary consult for cough and abnormal CT chest . CT showed traction bronchiectasis and patchy interstitial markings. Labs showed nml Ra factor and ANA . ACE was high at 195. Marland Kitchen Felt she may have possible Sarcoid . She was started on Prednisone 39m . She says she is feeling much better ., cough is almost gone.  Has decreased sob. Denies chest pain, orthopnea, edema or fever or rash.  PFT done today, FEV1 66%, ratio 88, FVC 60%, DLCO 36%. No sgin BD response .   Patient Active Problem List   Diagnosis Date Noted  . [place]oidosis ([place] 05/04/2015  . [place]asis ([facility] 05/04/2015  . Smoker 02/12/2014    Current Outpatient Prescriptions on File Prior to Visit  Medication Sig Dispense Refill  . albuterol (PROVENTIL HFA;VENTOLIN HFA) 108 (90 BASE) MCG/ACT inhaler Inhale 2 puffs into the lungs every 6 (six) hours as needed for wheezing or shortness of breath. 18 g 4  . beclomethasone (QVAR) 80 MCG/ACT inhaler Inhale 1 puff into the lungs 2 (two) times daily. 8.7 g 5  . predniSONE (DELTASONE) 20 MG tablet Take 2 tablets (40 mg total) by mouth daily with breakfast. 60 tablet 2   No current facility-administered medications on file prior to visit.     Review of Systems Constitutional:   No  weight loss, night sweats,  Fevers, chills, fatigue, or  lassitude.  HEENT:   No headaches,  Difficulty swallowing,  Tooth/dental problems, or  Sore throat,          No sneezing, itching, ear ache, nasal congestion, post nasal drip,   CV:  No chest pain,  Orthopnea, PND, swelling in lower extremities, anasarca, dizziness, palpitations, syncope.   GI  No heartburn, indigestion, abdominal pain, nausea, vomiting, diarrhea, change in bowel habits, loss of appetite, bloody stools.   Resp:   No chest wall deformity  Skin: no rash or lesions.  GU: no dysuria, change in color of urine, no urgency or frequency.  No flank pain, no hematuria   MS:  No joint pain or swelling.  No decreased range of motion.  No back pain.  Psych:  No change in mood or affect. No depression or anxiety.  No memory loss.         Objective:   Physical Exam Filed Vitals:   05/26/15 1610  BP: 128/72  Pulse: 58  Height: 5' 5"  (1.651 m)  Weight: 172 lb 12.8 oz (78.382 kg)  SpO2: 95%   GEN: A/Ox3; pleasant , NAD, well nourished   HEENT:  [place]/AT,  EACs-clear, TMs-wnl, NOSE-clear, THROAT-clear, no lesions, no postnasal drip or exudate noted.   NECK:  Supple w/ fair ROM; no JVD; normal carotid impulses w/o bruits; no thyromegaly or nodules palpated; no lymphadenopathy.  RESP  Clear  P & A; w/o, wheezes/ rales/ or rhonchi.no accessory muscle use, no dullness to percussion  CARD:  RRR,  no m/r/g  , no peripheral edema, pulses intact, no cyanosis or clubbing.  GI:   Soft & nt; nml bowel sounds; no organomegaly or masses detected.  Musco: Warm bil, no deformities or joint swelling noted.   Neuro: alert, no focal deficits noted.    Skin: Warm, no lesions or rashes         Assessment & Plan:

## 2015-05-26 NOTE — Telephone Encounter (Signed)
Pt saw TP today and was told to follow up with Dr. Craige Cotta in 6 weeks. Dr. Craige Cotta next available is in June. Please advise Dr. Craige Cotta if okay to overbook?

## 2015-05-27 NOTE — Telephone Encounter (Signed)
Okay to double book visit with me. 

## 2015-05-27 NOTE — Telephone Encounter (Signed)
Called spoke with pt and appt scheduled for 07/14/15. Nothing further needed

## 2015-05-27 NOTE — Progress Notes (Signed)
Reviewed and agree with assessment/plan. 

## 2015-06-01 ENCOUNTER — Telehealth: Payer: Self-pay | Admitting: Pulmonary Disease

## 2015-06-01 MED ORDER — PREDNISONE 10 MG PO TABS: ORAL_TABLET | ORAL | Status: DC

## 2015-06-01 NOTE — Telephone Encounter (Signed)
Per 05/26/15 OV: Patient Instructions       Decrease Prednisone  daily for 1 week then  daily for 1 week then  daily -hold at this dose.   Follow up with Dr. Craige Cotta  In 6 weeks and As needed   Called spoke with pt. She needed the prednisone RX sent in. I have done so. Nothing further needed

## 2015-07-14 ENCOUNTER — Ambulatory Visit: Payer: 59 | Admitting: Pulmonary Disease

## 2015-07-14 ENCOUNTER — Encounter: Payer: Self-pay | Admitting: Pulmonary Disease

## 2015-07-14 ENCOUNTER — Ambulatory Visit (INDEPENDENT_AMBULATORY_CARE_PROVIDER_SITE_OTHER): Payer: 59 | Admitting: Pulmonary Disease

## 2015-07-14 VITALS — BP 110/80 | HR 82 | Ht 65.0 in | Wt 180.4 lb

## 2015-07-14 DIAGNOSIS — J45909 Unspecified asthma, uncomplicated: Secondary | ICD-10-CM

## 2015-07-14 DIAGNOSIS — R058 Other specified cough: Secondary | ICD-10-CM

## 2015-07-14 DIAGNOSIS — D860 Sarcoidosis of lung: Secondary | ICD-10-CM | POA: Diagnosis not present

## 2015-07-14 DIAGNOSIS — R05 Cough: Secondary | ICD-10-CM

## 2015-07-14 MED ORDER — TRIAMCINOLONE ACETONIDE 55 MCG/ACT NA AERO: 2.0000 | INHALATION_SPRAY | Freq: Every day | NASAL | Status: DC

## 2015-07-14 MED ORDER — PREDNISONE 10 MG PO TABS: ORAL_TABLET | ORAL | Status: DC

## 2015-07-14 NOTE — Progress Notes (Signed)
Current Outpatient Prescriptions on File Prior to Visit  Medication Sig  . albuterol (PROVENTIL HFA;VENTOLIN HFA) 108 (90 BASE) MCG/ACT inhaler Inhale 2 puffs into the lungs every 6 (six) hours as needed for wheezing or shortness of breath.  . beclomethasone (QVAR) 80 MCG/ACT inhaler Inhale 1 puff into the lungs 2 (two) times daily.   No current facility-administered medications on file prior to visit.     Chief Complaint  Patient presents with  . Follow-up    Pt notes some improvement since last OV. Pt reports cough, SOB and wheezing only pressent with over-exertion.     Tests CT chest 03/18/15 >> biapical thickening with traction BTX, BTX RML and lingula, subcarinal LAN 1.7 cm, patchy increased interstitial markings Labs 04/21/15 >> HIV negative, Quantiferon gold negative, ACE 195, ANA, RF negative, ESR 32 PFT 05/26/15 >> FEV1 1.67 (66%), FEV1% 88, TLC 2/78 (53%), DLCO 36%   Past medical hx, Past surgical hx, Allergies, Family hx, Social hx all reviewed.  Vital Signs BP 110/80 mmHg  Pulse 82  Ht 5' 5"  (1.651 m)  Wt 180 lb 6.4 oz (81.829 kg)  BMI 30.02 kg/m2  SpO2 96%  History of Present Illness Hailey Ross is a 45 y.o. 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  She is doing better.  She only gets winded if she runs.  She is not having wheeze, chest pain, skin rash.  She has noticed sinus congestion and post nasal drip for last week.  She has not needed to use albuterol.  She is using Qvar bid.  She is down to 10 mg prednisone daily.  Physical Exam  General - No distress ENT - No sinus tenderness, no oral exudate, no LAN Cardiac - s1s2 regular, no murmur Chest - No wheeze/rales/dullness Back - No focal tenderness Abd - Soft, non-tender Ext - No edema Neuro - Normal strength Skin - No rashes Psych - normal mood, and behavior   Assessment/Plan  Pulmonary sarcoidosis. - continue to wean prednisone  Asthmatic bronchitis. - continue Qvar with prn  albuterol  Upper airway cough with post nasal drip. - nasacort prn   Patient Instructions  Prednisone 10 mg pills >> 1 pill daily for 2 weeks, then 1/2 pill daily for 2 weeks, then 1/2 pill every other day for 2 weeks, then stop prednisone  Nasacort two sprays in each nostril daily as needed for sinus congestion and post nasal drip  Follow up in 3 months     Chesley Mires, MD [place] [facility]Critical [facility]/Sleep Pager:  [phone] 07/14/2015

## 2015-07-14 NOTE — Patient Instructions (Signed)
Prednisone 10 mg pills >> 1 pill daily for 2 weeks, then 1/2 pill daily for 2 weeks, then 1/2 pill every other day for 2 weeks, then stop prednisone  Nasacort two sprays in each nostril daily as needed for sinus congestion and post nasal drip  Follow up in 3 months

## 2015-08-25 ENCOUNTER — Encounter (HOSPITAL_COMMUNITY): Payer: Self-pay | Admitting: Emergency Medicine

## 2015-08-25 ENCOUNTER — Emergency Department (HOSPITAL_COMMUNITY)
Admission: EM | Admit: 2015-08-25 | Discharge: 2015-08-25 | Disposition: A | Discharge status: Home / Self Care | Payer: 59 | Attending: Emergency Medicine | Admitting: Emergency Medicine

## 2015-08-25 DIAGNOSIS — Z87891 Personal history of nicotine dependence: Secondary | ICD-10-CM | POA: Diagnosis not present

## 2015-08-25 DIAGNOSIS — M545 Low back pain, unspecified: Secondary | ICD-10-CM

## 2015-08-25 DIAGNOSIS — Z79899 Other long term (current) drug therapy: Secondary | ICD-10-CM | POA: Diagnosis not present

## 2015-08-25 HISTORY — DX: Sarcoidosis, unspecified: D86.9

## 2015-08-25 MED ORDER — IBUPROFEN 800 MG PO TABS: 800.0000 mg | ORAL_TABLET | Freq: Three times a day (TID) | ORAL | Status: DC

## 2015-08-25 MED ORDER — METHOCARBAMOL 500 MG PO TABS: 500.0000 mg | ORAL_TABLET | Freq: Two times a day (BID) | ORAL | Status: DC | PRN

## 2015-08-25 MED ORDER — KETOROLAC TROMETHAMINE 60 MG/2ML IM SOLN
60.0000 mg | Freq: Once | INTRAMUSCULAR | Status: AC
  Administered 2015-08-25: 60 mg via INTRAMUSCULAR
  Filled 2015-08-25: qty 2

## 2015-08-25 NOTE — ED Notes (Signed)
Pt alert & oriented x4, stable gait. Patient given discharge instructions, paperwork & prescription(s). Patient  instructed to stop at the registration desk to finish any additional paperwork. Patient verbalized understanding. Pt left department w/ no further questions. 

## 2015-08-25 NOTE — ED Notes (Signed)
Pt states back pain for the past few days. Took a day off work & was feeling better. Pain worse after working. 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

## 2015-08-25 NOTE — ED Provider Notes (Signed)
CSN: [id number]     Arrival date & time 08/25/15  0007 History   First MD Initiated Contact with Patient 08/25/15 0051     Chief Complaint  Patient presents with  . Back Pain     (Consider location/radiation/quality/duration/timing/severity/associated sxs/prior Treatment) HPI  The pt has had back pain for 4 days - started at work - then went awy and came back next day Is bilateral lower back, reports no neuro sx. No urinary sx and no red flags for back pain - worse with staying still.  Sx are constant, worse with bending over.  No meds pta.    Past Medical History  Diagnosis Date  . Sarcoidosis [facility])    Past Surgical History  Procedure Laterality Date  . Tubal ligation  02/1993   Family History  Problem Relation Age of Onset  . Hypertension Mother    Social History  Substance Use Topics  . Smoking status: Former Smoker -- 1.00 packs/day for 20 years    Types: Cigarettes    Quit date: 03/21/2015  . Smokeless tobacco: Never Used  . Alcohol Use: 0.0 oz/week    0 Standard drinks or equivalent per week     Comment: Rare   OB History    No data available     Review of Systems  Constitutional: Negative for fever and chills.  Cardiovascular: Negative for leg swelling.  Gastrointestinal: Negative for nausea and vomiting.       No incontinence of bowel  Genitourinary: Negative for difficulty urinating.       No incontinence or retention  Musculoskeletal: Positive for back pain. Negative for neck pain.  Skin: Negative for rash.  Neurological: Negative for weakness and numbness.      Allergies  Review of patient's allergies indicates no known allergies.  Home Medications   Prior to Admission medications   Medication Sig Start Date End Date Taking? Authorizing Provider  albuterol (PROVENTIL HFA;VENTOLIN HFA) 108 (90 BASE) MCG/ACT inhaler Inhale 2 puffs into the lungs every 6 (six) hours as needed for wheezing or shortness of breath. 03/26/15  Yes Dorena Bodo, PA-C   beclomethasone (QVAR) 80 MCG/ACT inhaler Inhale 1 puff into the lungs 2 (two) times daily. 03/26/15  Yes Mary B Dixon, PA-C  predniSONE (DELTASONE) 10 MG tablet 1 pill daily for 2 weeks, then 1/2 pill daily for 2 weeks, then 1/2 pill every other day for 2 weeks 07/14/15  Yes Coralyn Helling, MD  triamcinolone (NASACORT AQ) 55 MCG/ACT AERO nasal inhaler Place 2 sprays into the nose daily. 07/14/15  Yes Coralyn Helling, MD  ibuprofen (ADVIL,MOTRIN) 800 MG tablet Take 1 tablet (800 mg total) by mouth 3 (three) times daily. 08/25/15   Eber Hong, MD  methocarbamol (ROBAXIN) 500 MG tablet Take 1 tablet (500 mg total) by mouth 2 (two) times daily as needed for muscle spasms. 08/25/15   Eber Hong, MD   BP 164/94 mmHg  Pulse 59  Temp(Src) 98.1 F (36.7 C)  Resp 18  Ht  (1.651 m)  Wt 178 lb (80.74 kg)  BMI 29.62 kg/m2  SpO2 100%  LMP 08/17/2015 Physical Exam  Constitutional: She appears well-developed and well-nourished. No distress.  HENT:  Head: Normocephalic and atraumatic.  Eyes: Conjunctivae are normal. Right eye exhibits no discharge. Left eye exhibits no discharge. No scleral icterus.  Cardiovascular: Normal rate and regular rhythm.   Pulmonary/Chest: Effort normal and breath sounds normal.  Musculoskeletal: She exhibits no edema.  Tenderness of the back over the  bilateral paraspinal muslces of the Lumbar back No tenderness over the Cervical, Thoracic or Lumbar Spine  Neurological:  Speech is clear, strength in the UE and LE's are normal at the major muscle groups including the hip, knee and ankles.  Sensation in tact to light touch and pin prick of the bilateral LE's.  Normal reflexes at the knees bilaterally.  Gait antalgic secondary to pain (minimal)  Skin: Skin is warm and dry. No rash noted. She is not diaphoretic.    ED Course  Procedures (including critical care time) Labs Review Labs Reviewed - No data to display  Imaging Review No results found. I have personally reviewed  and evaluated these images and lab results as part of my medical decision-making.    MDM   Final diagnoses:  Bilateral low back pain without sciatica    Low back pain - no radicular sx, no signs of spinal stenosis / cauda equina. Pt understands indications for return.  Meds given in ED:  Medications  ketorolac (TORADOL) injection 60 mg (not administered)    New Prescriptions   IBUPROFEN (ADVIL,MOTRIN) 800 MG TABLET    Take 1 tablet (800 mg total) by mouth 3 (three) times daily.   METHOCARBAMOL (ROBAXIN) 500 MG TABLET    Take 1 tablet (500 mg total) by mouth 2 (two) times daily as needed for muscle spasms.      Eber HongBrian Corgan Mormile, MD 08/25/15 [phone]

## 2015-08-25 NOTE — ED Notes (Signed)
Pt c/o lower back pain for a few days.

## 2015-08-25 NOTE — Discharge Instructions (Signed)

## 2015-10-26 ENCOUNTER — Encounter: Payer: Self-pay | Admitting: Pulmonary Disease

## 2015-10-26 ENCOUNTER — Ambulatory Visit (INDEPENDENT_AMBULATORY_CARE_PROVIDER_SITE_OTHER): Payer: 59 | Admitting: Pulmonary Disease

## 2015-10-26 VITALS — BP 122/92 | HR 63 | Ht 65.0 in | Wt 194.4 lb

## 2015-10-26 DIAGNOSIS — D860 Sarcoidosis of lung: Secondary | ICD-10-CM | POA: Diagnosis not present

## 2015-10-26 DIAGNOSIS — J45909 Unspecified asthma, uncomplicated: Secondary | ICD-10-CM | POA: Diagnosis not present

## 2015-10-26 DIAGNOSIS — J479 Bronchiectasis, uncomplicated: Secondary | ICD-10-CM

## 2015-10-26 MED ORDER — PREDNISONE 5 MG PO TABS: 5.0000 mg | ORAL_TABLET | Freq: Every day | ORAL | Status: DC

## 2015-10-26 NOTE — Patient Instructions (Signed)
Prednisone 5 mg daily  Follow up in 3 months

## 2015-10-26 NOTE — Progress Notes (Signed)
Current Outpatient Prescriptions on File Prior to Visit  Medication Sig  . albuterol (PROVENTIL HFA;VENTOLIN HFA) 108 (90 BASE) MCG/ACT inhaler Inhale 2 puffs into the lungs every 6 (six) hours as needed for wheezing or shortness of breath.  . beclomethasone (QVAR) 80 MCG/ACT inhaler Inhale 1 puff into the lungs 2 (two) times daily.  Marland Kitchen ibuprofen (ADVIL,MOTRIN) 800 MG tablet Take 1 tablet (800 mg total) by mouth 3 (three) times daily.  . methocarbamol (ROBAXIN) 500 MG tablet Take 1 tablet (500 mg total) by mouth 2 (two) times daily as needed for muscle spasms.  Marland Kitchen triamcinolone (NASACORT AQ) 55 MCG/ACT AERO nasal inhaler Place 2 sprays into the nose daily.   No current facility-administered medications on file prior to visit.     Chief Complaint  Patient presents with  . Follow-up    Pt denies any current breathing issues. Pt reports decreased endurance with heavier exertional activities     Pulmonary tests CT chest 03/18/15 >> biapical thickening with traction BTX, BTX RML and lingula, subcarinal LAN 1.7 cm, patchy increased interstitial markings Labs 04/21/15 >> HIV negative, Quantiferon gold negative, ACE 195, ANA, RF negative, ESR 32 PFT 05/26/15 >> FEV1 1.67 (66%), FEV1% 88, TLC 2/78 (53%), DLCO 36%   Past medical hx, Past surgical hx, Allergies, Family hx, Social hx all reviewed.  Vital Signs BP 132/100 mmHg  Pulse 63  Ht 5' 5"  (1.651 m)  Wt 194 lb 6.4 oz (88.179 kg)  BMI 32.35 kg/m2  SpO2 94%  History of Present Illness Hailey Ross is a 45 y.o. 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  She noticed more cough when she changed from prednisone 5 mg daily to every other day.  She also notices more trouble with her breathing with lower dose of prednisone.  She is not having sputum, chest pain, fever, or hemoptysis.  She denies skin rash, leg swelling, or gland swelling.    She has been using Qvar.  She doesn't use albuterol much >> this does help when she  uses it.  Physical Exam  General - No distress ENT - No sinus tenderness, no oral exudate, no LAN Cardiac - s1s2 regular, no murmur Chest - No wheeze/rales/dullness Back - No focal tenderness Abd - Soft, non-tender Ext - No edema Neuro - Normal strength Skin - No rashes Psych - normal mood, and behavior   Assessment/Plan  Pulmonary sarcoidosis with bronchiectasis. - will have her change to prednisone 5 mg daily and remain on this dose until next follow up  Asthmatic bronchitis. - continue Qvar with prn albuterol  Upper airway cough with post nasal drip. - nasacort prn   Patient Instructions  Prednisone 5 mg daily  Follow up in 3 months     Chesley Mires, MD [place] [facility]/Critical Care/Sleep Pager:  [phone] 10/26/2015

## 2015-12-21 ENCOUNTER — Telehealth: Payer: Self-pay

## 2015-12-21 NOTE — Telephone Encounter (Signed)
Hailey Ross, can you help Tiffany with figuring out what med they will cover.

## 2015-12-21 NOTE — Telephone Encounter (Signed)
Pharmacy called patients ins will not cover Qvar. Would like to know if you could prescribe something else?

## 2016-01-08 MED ORDER — FLUTICASONE PROPIONATE HFA 220 MCG/ACT IN AERO: 1.0000 | INHALATION_SPRAY | Freq: Two times a day (BID) | RESPIRATORY_TRACT | 12 refills | Status: DC

## 2016-01-08 NOTE — Telephone Encounter (Signed)
Spoke to patient and made her aware of medication changes and recommendations from PCP

## 2016-01-08 NOTE — Telephone Encounter (Signed)
Qvar not covered by insurance.  Per provider change to Flovent HFA 220 mg 1 puff BID.  Rx to pharmacy.  Per provider we need to let pt know that Pulmonary provider needs to follow her for her further refills.  Have left message for pt to call me back.

## 2016-02-01 ENCOUNTER — Ambulatory Visit (INDEPENDENT_AMBULATORY_CARE_PROVIDER_SITE_OTHER): Payer: 59 | Admitting: Pulmonary Disease

## 2016-02-01 ENCOUNTER — Encounter: Payer: Self-pay | Admitting: Pulmonary Disease

## 2016-02-01 ENCOUNTER — Ambulatory Visit (INDEPENDENT_AMBULATORY_CARE_PROVIDER_SITE_OTHER)
Admission: RE | Admit: 2016-02-01 | Discharge: 2016-02-01 | Disposition: A | Discharge status: Home / Self Care | Payer: 59 | Source: Ambulatory Visit | Attending: Pulmonary Disease | Admitting: Pulmonary Disease

## 2016-02-01 ENCOUNTER — Other Ambulatory Visit (INDEPENDENT_AMBULATORY_CARE_PROVIDER_SITE_OTHER): Payer: 59

## 2016-02-01 VITALS — BP 122/84 | HR 69 | Ht 65.0 in | Wt 203.6 lb

## 2016-02-01 DIAGNOSIS — D860 Sarcoidosis of lung: Secondary | ICD-10-CM | POA: Diagnosis not present

## 2016-02-01 DIAGNOSIS — J45909 Unspecified asthma, uncomplicated: Secondary | ICD-10-CM

## 2016-02-01 DIAGNOSIS — J479 Bronchiectasis, uncomplicated: Secondary | ICD-10-CM | POA: Diagnosis not present

## 2016-02-01 LAB — COMPREHENSIVE METABOLIC PANEL
ALBUMIN: 3.8 g/dL (ref 3.5–5.2)
ALK PHOS: 66 U/L (ref 39–117)
ALT: 15 U/L (ref 0–35)
AST: 23 U/L (ref 0–37)
BUN: 16 mg/dL (ref 6–23)
CO2: 27 mEq/L (ref 19–32)
CREATININE: 1.34 mg/dL — AB (ref 0.40–1.20)
Calcium: 9.2 mg/dL (ref 8.4–10.5)
Chloride: 106 mEq/L (ref 96–112)
GFR: 54.82 mL/min — ABNORMAL LOW (ref >60.00)
Glucose, Bld: 82 mg/dL (ref 70–99)
Potassium: 4.1 mEq/L (ref 3.5–5.1)
SODIUM: 137 meq/L (ref 135–145)
TOTAL PROTEIN: 6.9 g/dL (ref 6.0–8.3)
Total Bilirubin: 0.5 mg/dL (ref 0.2–1.2)

## 2016-02-01 LAB — CBC
HEMATOCRIT: 38.8 % (ref 36.0–46.0)
Hemoglobin: 12.9 g/dL (ref 12.0–15.0)
MCHC: 33.3 g/dL (ref 30.0–36.0)
MCV: 81.8 fl (ref 78.0–100.0)
Platelets: 317 10*3/uL (ref 150.0–400.0)
RBC: 4.74 Mil/uL (ref 3.87–5.11)
RDW: 14 % (ref 11.5–15.5)
WBC: 10.5 10*3/uL (ref 4.0–10.5)

## 2016-02-01 LAB — SEDIMENTATION RATE: SED RATE: 21 mm/h — AB (ref 0–20)

## 2016-02-01 NOTE — Progress Notes (Signed)
Current Outpatient Prescriptions on File Prior to Visit  Medication Sig  . albuterol (PROVENTIL HFA;VENTOLIN HFA) 108 (90 BASE) MCG/ACT inhaler Inhale 2 puffs into the lungs every 6 (six) hours as needed for wheezing or shortness of breath.  . fluticasone (FLOVENT HFA) 220 MCG/ACT inhaler Inhale 1 puff into the lungs 2 (two) times daily.  Marland Kitchen ibuprofen (ADVIL,MOTRIN) 800 MG tablet Take 1 tablet (800 mg total) by mouth 3 (three) times daily.  . methocarbamol (ROBAXIN) 500 MG tablet Take 1 tablet (500 mg total) by mouth 2 (two) times daily as needed for muscle spasms.  . predniSONE (DELTASONE) 5 MG tablet Take 1 tablet (5 mg total) by mouth daily with breakfast.  . triamcinolone (NASACORT AQ) 55 MCG/ACT AERO nasal inhaler Place 2 sprays into the nose daily.   No current facility-administered medications on file prior to visit.      Chief Complaint  Patient presents with  . Follow-up    Pt c/o SOB with longer distances, pt states that she gets tired quicker with exertion.      Pulmonary tests CT chest 03/18/15 >> biapical thickening with traction BTX, BTX RML and lingula, subcarinal LAN 1.7 cm, patchy increased interstitial markings Labs 04/21/15 >> HIV negative, Quantiferon gold negative, ACE 195, ANA, RF negative, ESR 32 PFT 05/26/15 >> FEV1 1.67 (66%), FEV1% 88, TLC 2/78 (53%), DLCO 36%   Past medical hx, Past surgical hx, Allergies, Family hx, Social hx all reviewed.  Vital Signs BP 122/84 (BP Location: Left Arm, Cuff Size: Normal)   Pulse 69   Ht 5' 5"  (1.651 m)   Wt 203 lb 9.6 oz (92.4 kg)   SpO2 92%   BMI 33.88 kg/m   History of Present Illness Hailey Ross is a 45 y.o. 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  She has noticed more trouble with exertion since decreasing to 5 mg prednisone per day.  She also gets occasional cough with sputum.  She is not having fever, chest pain, sweats, hemoptysis, skin rash, or leg swelling.  Physical Exam  General -  No distress ENT - No sinus tenderness, no oral exudate, no LAN, wears dentures Cardiac - s1s2 regular, no murmur Chest - No wheeze/rales/dullness Back - No focal tenderness Abd - Soft, non-tender Ext - No edema Neuro - Normal strength Skin - No rashes Psych - normal mood, and behavior   Assessment/Plan  Pulmonary sarcoidosis with bronchiectasis. - increase to 10 mg prednisone daily  - repeat labs and chest xray today - will then decide if she needs additional medications for sarcoidosis  Asthmatic bronchitis. - continue flovent with prn albuterol  Upper airway cough with post nasal drip. - nasacort prn   Patient Instructions  Increase prednisone to 10 mg daily  Chest xray and labs today  Follow up in 4 weeks with Hailey Ross or Nurse Practitioner    Chesley Mires, MD [place] [facility]/Critical Care/Sleep Pager:  [phone] 02/01/2016

## 2016-02-01 NOTE — Patient Instructions (Signed)
Increase prednisone to 10 mg daily  Chest xray and labs today  Follow up in 4 weeks with Dr. Craige CottaSood or Nurse Practitioner

## 2016-02-02 LAB — ANGIOTENSIN CONVERTING ENZYME: ANGIOTENSIN-CONVERTING ENZYME: 166 U/L — AB (ref 9–67)

## 2016-02-03 ENCOUNTER — Other Ambulatory Visit: Payer: Self-pay

## 2016-02-03 MED ORDER — FLUTICASONE PROPIONATE HFA 220 MCG/ACT IN AERO: 1.0000 | INHALATION_SPRAY | Freq: Two times a day (BID) | RESPIRATORY_TRACT | 1 refills | Status: DC

## 2016-02-03 NOTE — Telephone Encounter (Signed)
Pt has not been seen since 01/2015 and would need to have an OV before anymore refills Letter out

## 2016-02-26 ENCOUNTER — Other Ambulatory Visit: Payer: Self-pay | Admitting: Pulmonary Disease

## 2016-02-26 MED ORDER — PREDNISONE 5 MG PO TABS: 10.0000 mg | ORAL_TABLET | Freq: Every day | ORAL | 0 refills | Status: DC

## 2016-02-26 NOTE — Telephone Encounter (Signed)
Refill request received from [facility] for Pred 5mg  refill Last OV patient's does was increased to 10mg  daily.  Called and spoke with patient, pt states that she is still taking 10mg  daily (takes two 5mg  tabs).  Pt aware that I will refill her Pred to [facility] for the 10mg  dose.  This has been sent to [facility]. Nothing further needed.

## 2016-02-29 ENCOUNTER — Encounter: Payer: Self-pay | Admitting: Adult Health

## 2016-02-29 ENCOUNTER — Ambulatory Visit (INDEPENDENT_AMBULATORY_CARE_PROVIDER_SITE_OTHER): Payer: 59 | Admitting: Adult Health

## 2016-02-29 DIAGNOSIS — D869 Sarcoidosis, unspecified: Secondary | ICD-10-CM | POA: Diagnosis not present

## 2016-02-29 NOTE — Assessment & Plan Note (Signed)
Suspect flare with high ACE level  CXR was stable  Will try higher level of steroids and slow taper to 10mg  daily . Hold at this dose Consider ACE level on return  Will need to look at steroid sparing  sarcoid treatment  If unable to get of prednisone .   Plan  Patient Instructions  Increase prednisone to 20mg  daily for 2 weeks then 10mg  daily and hold at this dose .  Follow up with Dr. Craige CottaSood  In 6 weeks and As needed   Please contact office for sooner follow up if symptoms do not improve or worsen or seek emergency care

## 2016-02-29 NOTE — Progress Notes (Signed)
Subjective:    Patient ID: Hailey Ross, female    DOB: 02/22/1971, 45 y.o.   MRN: [id number]  HPI 45 yo female former smoker seen for pulmonary consult 1/101/7 for chronic cough and abnormal CT chest   Tests CT chest 03/18/15 >> biapical thickening with traction BTX, BTX RML and lingula, subcarinal LAN 1.7 cm, patchy increased interstitial markings Labs 04/21/15 >> HIV negative, ESR 32, ACE 195, RF < 10, ANA negative PFT 05/26/15 >> FEV1 1.67 (66%), FEV1% 88, TLC 2/78 (53%), DLCO 36%   02/29/2016 Follow up : Cough /Abnormal CT ? Sarcoid  Pt presents for a 4 week follow up .  She was seen last for cough and doe  flare . Felt to have Sarcoid flare with ACE level remains  very high at 166.  CXR showed stable interstitial densities. Prednisone was increased to 51m daily . Says she was sx free on higher dose of steroids. Once she got to 539msx of doe/cough came back .   Denies chest pain, orthopnea, edema or fever or rash.    Patient Active Problem List   Diagnosis Date Noted  . S[place]oidosis ([facility] [place]/23/2017  . Bronchiectasis ([facility]/23/2017  . Smoker 02/12/2014    Current Outpatient Prescriptions on File Prior to Visit  Medication Sig Dispense Refill  . albuterol (PROVENTIL HFA;VENTOLIN HFA) 108 (90 BASE) MCG/ACT inhaler Inhale 2 puffs into the lungs every 6 (six) hours as needed for wheezing or shortness of breath. 18 g 4  . fluticasone (FLOVENT HFA) 220 MCG/ACT inhaler Inhale 1 puff into the lungs 2 (two) times daily. Patient will need an office before anymore refills 1 Inhaler 1  . predniSONE (DELTASONE) 5 MG tablet Take 2 tablets (10 mg total) by mouth daily with breakfast. 180 tablet 0  . triamcinolone (NASACORT AQ) 55 MCG/ACT AERO nasal inhaler Place 2 sprays into the nose daily. 1 Inhaler 12  . ibuprofen (ADVIL,MOTRIN) 800 MG tablet Take 1 tablet (800 mg total) by mouth 3 (three) times daily. (Patient not taking: Reported on 02/29/2016) 21 tablet 0   No current  facility-administered medications on file prior to visit.      Review of Systems Constitutional:   No  weight loss, night sweats,  Fevers, chills, + fatigue, or  lassitude.  HEENT:   No headaches,  Difficulty swallowing,  Tooth/dental problems, or  Sore throat,                No sneezing, itching, ear ache, nasal congestion, post nasal drip,   CV:  No chest pain,  Orthopnea, PND, swelling in lower extremities, anasarca, dizziness, palpitations, syncope.   GI  No heartburn, indigestion, abdominal pain, nausea, vomiting, diarrhea, change in bowel habits, loss of appetite, bloody stools.   Resp:   No chest wall deformity  Skin: no rash or lesions.  GU: no dysuria, change in color of urine, no urgency or frequency.  No flank pain, no hematuria   MS:  No joint pain or swelling.  No decreased range of motion.  No back pain.  Psych:  No change in mood or affect. No depression or anxiety.  No memory loss.         Objective:   Physical Exam Vitals:   02/29/16 1506  BP: 118/70  Pulse: 73  Temp: 98.2 F (36.8 C)  TempSrc: Oral  SpO2: 100%  Weight: 204 lb 12.8 oz (92.9 kg)  Height: 5' 5"  (1.651 m)   GEN: A/Ox3; pleasant , NAD,  well nourished    HEENT:  [place]/AT,  EACs-clear, TMs-wnl, NOSE-clear, THROAT-clear, no lesions, no postnasal drip or exudate noted.   NECK:  Supple w/ fair ROM; no JVD; normal carotid impulses w/o bruits; no thyromegaly or nodules palpated; no lymphadenopathy.    RESP  Clear  P & A; w/o, wheezes/ rales/ or rhonchi. no accessory muscle use, no dullness to percussion  CARD:  RRR, no m/r/g  , no peripheral edema, pulses intact, no cyanosis or clubbing.  GI:   Soft & nt; nml bowel sounds; no organomegaly or masses detected.   Musco: Warm bil, no deformities or joint swelling noted.   Neuro: alert, no focal deficits noted.    Skin: Warm, no lesions or rashes         Assessment & Plan:

## 2016-02-29 NOTE — Patient Instructions (Signed)
Increase prednisone to 20mg  daily for 2 weeks then 10mg  daily and hold at this dose .  Follow up with Dr. Craige CottaSood  In 6 weeks and As needed   Please contact office for sooner follow up if symptoms do not improve or worsen or seek emergency care

## 2016-03-01 ENCOUNTER — Telehealth: Payer: Self-pay | Admitting: Pulmonary Disease

## 2016-03-01 MED ORDER — PREDNISONE 5 MG PO TABS: 10.0000 mg | ORAL_TABLET | Freq: Every day | ORAL | 1 refills | Status: DC

## 2016-03-01 NOTE — Telephone Encounter (Signed)
Spoke with pt and advised that Prednisone ref[facility] sent on 02/26/16 [facility]  ! Week supply sent to [facility] today to last until mail order comes in.  Pt verbalized understanding.  Nothing further needed.

## 2016-03-01 NOTE — Progress Notes (Signed)
Reviewed and agree with assessment/plan.  Coralyn HellingVineet Kalliopi Coupland, MD [facility] 03/01/2016, 8:08 AM Pager:  [phone]

## 2016-03-02 ENCOUNTER — Other Ambulatory Visit: Payer: Self-pay | Admitting: Pulmonary Disease

## 2016-05-06 ENCOUNTER — Ambulatory Visit (INDEPENDENT_AMBULATORY_CARE_PROVIDER_SITE_OTHER): Payer: 59 | Admitting: Pulmonary Disease

## 2016-05-06 ENCOUNTER — Encounter: Payer: Self-pay | Admitting: Pulmonary Disease

## 2016-05-06 VITALS — BP 118/78 | HR 81 | Ht 65.0 in | Wt 209.0 lb

## 2016-05-06 DIAGNOSIS — J479 Bronchiectasis, uncomplicated: Secondary | ICD-10-CM

## 2016-05-06 DIAGNOSIS — D860 Sarcoidosis of lung: Secondary | ICD-10-CM | POA: Diagnosis not present

## 2016-05-06 NOTE — Progress Notes (Signed)
Current Outpatient Prescriptions on File Prior to Visit  Medication Sig  . albuterol (PROVENTIL HFA;VENTOLIN HFA) 108 (90 BASE) MCG/ACT inhaler Inhale 2 puffs into the lungs every 6 (six) hours as needed for wheezing or shortness of breath.  . fluticasone (FLOVENT HFA) 220 MCG/ACT inhaler Inhale 1 puff into the lungs 2 (two) times daily. Patient will need an office before anymore refills  . ibuprofen (ADVIL,MOTRIN) 800 MG tablet Take 1 tablet (800 mg total) by mouth 3 (three) times daily.  . predniSONE (DELTASONE) 5 MG tablet Take 2 tablets (10 mg total) by mouth daily with breakfast.  . triamcinolone (NASACORT AQ) 55 MCG/ACT AERO nasal inhaler Place 2 sprays into the nose daily.   No current facility-administered medications on file prior to visit.      Chief Complaint  Patient presents with  . Follow-up    No change in breathing since last OV. Pt states that the increased Pred dose(83m) helped her symptoms but returned after going back to 157mdose.      Pulmonary tests CT chest 03/18/15 >> biapical thickening with traction BTX, BTX RML and lingula, subcarinal LAN 1.7 cm, patchy increased interstitial markings Labs 04/21/15 >> HIV negative, Quantiferon gold negative, ACE 195, ANA, RF negative, ESR 32 PFT 05/26/15 >> FEV1 1.67 (66%), FEV1% 88, TLC 2/78 (53%), DLCO 36%  Past medical hx, Past surgical hx, Allergies, Family hx, Social hx all reviewed.  Vital Signs BP 118/78 (BP Location: Left Arm, Cuff Size: Normal)   Pulse 81   Ht 5' 5"  (1.651 m)   Wt 209 lb (94.8 kg)   SpO2 96%   BMI 34.78 kg/m   History of Present Illness Hailey Ross a [age].o. 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  She saw Tammy Parrett in November.  She had her prednisone increased.  She felt some improvement.  Her cough started flaring again when she dropped to 10 mg daily.  Her cough only happens when she exerts herself too much, and is no where near as bad as before she started  prednisone.  On level ground at steady pace she doesn't have any trouble.  She denies blurred vision, sinus congestion, sore throat, chest pain, nausea, diarrhea, skin rash, fever, or joint swelling.  Physical Exam  General - pleasant ENT - pupils reactive, no sinus tenderness, no oral exudate, no LAN Cardiac - regular, no murmur Chest - no wheeze,rales Back - no tenderness Abd - soft, non tender Ext - no edema Neuro - normal strength Skin - no rashes Psych - normal mood   CMP Latest Ref Rng & Units 02/01/2016 04/21/2015 02/17/2014  Glucose 70 - 99 mg/dL 82 85 77  BUN 6 - 23 mg/dL 16 19 11   Creatinine 0.40 - 1.20 mg/dL 1.34(H) 1.24(H) 1.17(H)  Sodium 135 - 145 mEq/L 137 138 137  Potassium 3.5 - 5.1 mEq/L 4.1 4.2 4.6  Chloride 96 - 112 mEq/L 106 105 104  CO2 19 - 32 mEq/L 27 27 27   Calcium 8.4 - 10.5 mg/dL 9.2 9.5 9.1  Total Protein 6.0 - 8.3 g/dL 6.9 7.3 7.0  Total Bilirubin 0.2 - 1.2 mg/dL 0.5 0.3 0.7  Alkaline Phos 39 - 117 U/L 66 79 130(H)  AST 0 - 37 U/L 23 24 26   ALT 0 - 35 U/L 15 17 14     CBC Latest Ref Rng & Units 02/01/2016 04/21/2015 02/17/2014  WBC 4.0 - 10.5 K/uL 10.5 11.4(H) 10.2  Hemoglobin 12.0 - 15.0 g/dL 12.9 13.3  13.0  Hematocrit 36.0 - 46.0 % 38.8 41.0 39.4  Platelets 150.0 - 400.0 K/uL 317.0 346.0 328      Assessment/Plan  Pulmonary sarcoidosis with bronchiectasis. - she has mild symptoms at present, and these are intermittent - will try to taper her dose of prednisone down - if she is not able to taper off prednisone, would then need to consider adding steroid sparing agent such as MTX  Asthmatic bronchitis. - continue flovent and prn albuterol now  Upper airway cough with post nasal drip. - prn nasacort   Patient Instructions  Prednisone 10 mg on one day alternating with 5 mg on next day.  Do this for two weeks, and if symptoms stable then change to prednisone 5 mg daily  Follow up with Dr. Halford Chessman or Nurse Practitioner in 8  weeks    Chesley Mires, MD [place] [facility]/Critical Care/Sleep Pager:  [phone] 05/06/2016

## 2016-05-06 NOTE — Patient Instructions (Signed)
Prednisone 10 mg on one day alternating with 5 mg on next day.  Do this for two weeks, and if symptoms stable then change to prednisone 5 mg daily  Follow up with Dr. Craige CottaSood or Nurse Practitioner in 8 weeks

## 2016-07-05 ENCOUNTER — Ambulatory Visit: Payer: 59 | Admitting: Pulmonary Disease

## 2016-08-08 ENCOUNTER — Encounter: Payer: Self-pay | Admitting: Physician Assistant

## 2016-08-22 ENCOUNTER — Emergency Department (HOSPITAL_COMMUNITY): Payer: 59

## 2016-08-22 ENCOUNTER — Emergency Department (HOSPITAL_COMMUNITY)
Admission: EM | Admit: 2016-08-22 | Discharge: 2016-08-22 | Disposition: A | Discharge status: Home / Self Care | Payer: 59 | Source: Home / Self Care | Attending: Emergency Medicine | Admitting: Emergency Medicine

## 2016-08-22 ENCOUNTER — Encounter (HOSPITAL_COMMUNITY): Payer: Self-pay | Admitting: Emergency Medicine

## 2016-08-22 DIAGNOSIS — K810 Acute cholecystitis: Secondary | ICD-10-CM | POA: Diagnosis not present

## 2016-08-22 DIAGNOSIS — Z87891 Personal history of nicotine dependence: Secondary | ICD-10-CM | POA: Insufficient documentation

## 2016-08-22 DIAGNOSIS — K8020 Calculus of gallbladder without cholecystitis without obstruction: Secondary | ICD-10-CM | POA: Insufficient documentation

## 2016-08-22 DIAGNOSIS — K8000 Calculus of gallbladder with acute cholecystitis without obstruction: Secondary | ICD-10-CM | POA: Diagnosis not present

## 2016-08-22 LAB — URINALYSIS, ROUTINE W REFLEX MICROSCOPIC
Bilirubin Urine: NEGATIVE
Glucose, UA: NEGATIVE mg/dL
Ketones, ur: 20 mg/dL — AB
Leukocytes, UA: NEGATIVE
Nitrite: NEGATIVE
Protein, ur: 30 mg/dL — AB
Specific Gravity, Urine: 1.032 — ABNORMAL HIGH (ref 1.005–1.030)
pH: 5 (ref 5.0–8.0)

## 2016-08-22 LAB — COMPREHENSIVE METABOLIC PANEL
ALT: 21 U/L (ref 14–54)
AST: 29 U/L (ref 15–41)
Albumin: 3.9 g/dL (ref 3.5–5.0)
Alkaline Phosphatase: 81 U/L (ref 38–126)
Anion gap: 7 (ref 5–15)
BUN: 16 mg/dL (ref 6–20)
CO2: 24 mmol/L (ref 22–32)
Calcium: 9.1 mg/dL (ref 8.9–10.3)
Chloride: 107 mmol/L (ref 101–111)
Creatinine, Ser: 1.03 mg/dL — ABNORMAL HIGH (ref 0.44–1.00)
GFR calc Af Amer: >60 mL/min (ref >60)
GFR calc non Af Amer: >60 mL/min (ref >60)
Glucose, Bld: 125 mg/dL — ABNORMAL HIGH (ref 65–99)
Potassium: 4 mmol/L (ref 3.5–5.1)
Sodium: 138 mmol/L (ref 135–145)
Total Bilirubin: 1 mg/dL (ref 0.3–1.2)
Total Protein: 7.5 g/dL (ref 6.5–8.1)

## 2016-08-22 LAB — CBC WITH DIFFERENTIAL/PLATELET
Basophils Absolute: 0 10*3/uL (ref 0.0–0.1)
Basophils Relative: 0 %
Eosinophils Absolute: 0 10*3/uL (ref 0.0–0.7)
Eosinophils Relative: 0 %
HCT: 39.8 % (ref 36.0–46.0)
Hemoglobin: 13.2 g/dL (ref 12.0–15.0)
Lymphocytes Relative: 7 %
Lymphs Abs: 1.2 10*3/uL (ref 0.7–4.0)
MCH: 27.3 pg (ref 26.0–34.0)
MCHC: 33.2 g/dL (ref 30.0–36.0)
MCV: 82.4 fL (ref 78.0–100.0)
Monocytes Absolute: 0.7 10*3/uL (ref 0.1–1.0)
Monocytes Relative: 4 %
Neutro Abs: 15 10*3/uL — ABNORMAL HIGH (ref 1.7–7.7)
Neutrophils Relative %: 89 %
Platelets: 323 10*3/uL (ref 150–400)
RBC: 4.83 MIL/uL (ref 3.87–5.11)
RDW: 13.4 % (ref 11.5–15.5)
WBC: 16.9 10*3/uL — ABNORMAL HIGH (ref 4.0–10.5)

## 2016-08-22 LAB — LIPASE, BLOOD: Lipase: 20 U/L (ref 11–51)

## 2016-08-22 LAB — I-STAT BETA HCG BLOOD, ED (MC, WL, AP ONLY): I-stat hCG, quantitative: <5 m[IU]/mL (ref <5)

## 2016-08-22 MED ORDER — ONDANSETRON HCL 4 MG/2ML IJ SOLN
4.0000 mg | Freq: Once | INTRAMUSCULAR | Status: AC
  Administered 2016-08-22: 4 mg via INTRAVENOUS
  Filled 2016-08-22: qty 2

## 2016-08-22 MED ORDER — SODIUM CHLORIDE 0.9 % IV BOLUS (SEPSIS)
1000.0000 mL | Freq: Once | INTRAVENOUS | Status: AC
  Administered 2016-08-22: 1000 mL via INTRAVENOUS

## 2016-08-22 MED ORDER — HYDROMORPHONE HCL 1 MG/ML IJ SOLN
0.7500 mg | Freq: Once | INTRAMUSCULAR | Status: AC
  Administered 2016-08-22: 0.75 mg via INTRAVENOUS
  Filled 2016-08-22: qty 1

## 2016-08-22 MED ORDER — ONDANSETRON HCL 4 MG PO TABS: 4.0000 mg | ORAL_TABLET | Freq: Four times a day (QID) | ORAL | 0 refills | Status: DC

## 2016-08-22 MED ORDER — OXYCODONE-ACETAMINOPHEN 5-325 MG PO TABS: 1.0000 | ORAL_TABLET | ORAL | 0 refills | Status: DC | PRN

## 2016-08-22 MED ORDER — HYDROMORPHONE HCL 1 MG/ML IJ SOLN
0.5000 mg | Freq: Once | INTRAMUSCULAR | Status: AC
  Administered 2016-08-22: 0.5 mg via INTRAVENOUS
  Filled 2016-08-22: qty 1

## 2016-08-22 MED ORDER — PROMETHAZINE HCL 25 MG/ML IJ SOLN
12.5000 mg | Freq: Once | INTRAMUSCULAR | Status: AC
  Administered 2016-08-22: 12.5 mg via INTRAVENOUS
  Filled 2016-08-22: qty 1

## 2016-08-22 MED ORDER — IOPAMIDOL (ISOVUE-300) INJECTION 61%
100.0000 mL | Freq: Once | INTRAVENOUS | Status: AC | PRN
  Administered 2016-08-22: 100 mL via INTRAVENOUS

## 2016-08-22 NOTE — ED Provider Notes (Signed)
[facility]-EMER[facility]ENCY DEPT Provider Note   CSN: [id number] Arrival date & time: 08/22/16  1749  By signing my name below, I, Sonum Patel, attest that this documentation has been prepared under the direction and in the presence of Raeford RazorKohut, Raisha Brabender, MD. Electronically Signed: Sonum Patel, Neurosurgeoncribe. 08/22/16. 7:56 PM.  History   Chief Complaint Chief Complaint  Patient presents with  . Abdominal Pain    The history is provided by the patient. No language interpreter was used.     HPI Comments: Hailey Ross is a 46 y.o. female who presents to the Emergency Department complaining of constant, gradually worsened periumbilical abdominal pain that began after waking this morning. She reports associated vomiting for the same period of time. She tried an Aleve without relief. She denies radiation to the back. She denies diarrhea, fever. She denies sick contacts. She has a history of a tubal ligation.   Past Medical History:  Diagnosis Date  . Sarcoidosis     Patient Active Problem List   Diagnosis Date Noted  . Sarcoidosis 05/04/2015  . Bronchiectasis ([facility]) 05/04/2015  . Smoker 02/12/2014    Past Surgical History:  Procedure Laterality Date  . TUBAL LIGATION  02/1993    OB History    No data available       Home Medications    Prior to Admission medications   Medication Sig Start Date End Date Taking? Authorizing Provider  albuterol (PROVENTIL HFA;VENTOLIN HFA) 108 (90 BASE) MCG/ACT inhaler Inhale 2 puffs into the lungs every 6 (six) hours as needed for wheezing or shortness of breath. 03/26/15   Allayne Butcherixon, Mary B, PA-C  fluticasone (FLOVENT HFA) 220 MCG/ACT inhaler Inhale 1 puff into the lungs 2 (two) times daily. Patient will need an office before anymore refills 02/03/16   Allayne Butcherixon, Mary B, PA-C  ibuprofen (ADVIL,MOTRIN) 800 MG tablet Take 1 tablet (800 mg total) by mouth 3 (three) times daily. 08/25/15   Eber HongMiller, Brian, MD  predniSONE (DELTASONE) 5 MG tablet Take 2 tablets (10 mg total)  by mouth daily with breakfast. 03/01/16   Parrett, Virgel Bouquetammy S, NP  triamcinolone (NASACORT AQ) 55 MCG/ACT AERO nasal inhaler Place 2 sprays into the nose daily. 07/14/15   Coralyn HellingSood, Vineet, MD    Family History Family History  Problem Relation Age of Onset  . Hypertension Mother     Social History Social History  Substance Use Topics  . Smoking status: Former Smoker    Packs/day: 1.00    Years: 20.00    Types: Cigarettes    Quit date: 03/21/2015  . Smokeless tobacco: Never Used  . Alcohol use 0.0 oz/week     Comment: Rare     Allergies   Patient has no known allergies.   Review of Systems Review of Systems  All other systems reviewed and are negative for acute change except as noted in the HPI.   Physical Exam Updated Vital Signs BP (!) 163/95   Pulse 75   Temp 97.8 F (36.6 C) (Oral)   Resp 18   Ht 5\' 5"  (1.651 m)   Wt 210 lb (95.3 kg)   LMP 08/20/2016   SpO2 95%   BMI 34.95 kg/m   Physical Exam  Constitutional: She is oriented to person, place, and time. She appears well-developed and well-nourished. No distress.  HENT:  Head: Normocephalic and atraumatic.  Eyes: EOM are normal.  Neck: Normal range of motion.  Cardiovascular: Normal rate, regular rhythm and normal heart sounds.   Pulmonary/Chest: Effort normal and  breath sounds normal.  Abdominal: Soft. She exhibits no distension. There is tenderness. There is no rebound and no guarding.  Periumbilical, epigastric, and RUQ tenderness to palpation. No CVA tenderness.   Musculoskeletal: Normal range of motion.  Neurological: She is alert and oriented to person, place, and time.  Skin: Skin is warm and dry.  Psychiatric: She has a normal mood and affect. Judgment normal.  Nursing note and vitals reviewed.   ED Treatments / Results  DIAGNOSTIC STUDIES: Oxygen Saturation is 95% on RA, adequate by my interpretation.    COORDINATION OF CARE: 7:54 PM Discussed treatment plan with pt at bedside and pt agreed to  plan.   Labs (all labs ordered are listed, but only abnormal results are displayed) Labs Reviewed  URINALYSIS, ROUTINE W REFLEX MICROSCOPIC - Abnormal; Notable for the following:       Result Value   APPearance HAZY (*)    Specific Gravity, Urine 1.032 (*)    Hgb urine dipstick LARGE (*)    Ketones, ur 20 (*)    Protein, ur 30 (*)    Bacteria, UA RARE (*)    Squamous Epithelial / LPF 0-5 (*)    All other components within normal limits  COMPREHENSIVE METABOLIC PANEL - Abnormal; Notable for the following:    Glucose, Bld 125 (*)    Creatinine, Ser 1.03 (*)    All other components within normal limits  CBC WITH DIFFERENTIAL/PLATELET - Abnormal; Notable for the following:    WBC 16.9 (*)    Neutro Abs 15.0 (*)    All other components within normal limits  LIPASE, BLOOD  I-STAT BETA HCG BLOOD, ED ([facility], [facility], [facility] ONLY)    EKG  EKG Interpretation None       Radiology Ct Abdomen Pelvis W Contrast  Result Date: 08/22/2016 CLINICAL DATA:  Progressive periumbilical and left-sided abdominal pain, onset this morning. Vomiting. EXAM: CT ABDOMEN AND PELVIS WITH CONTRAST TECHNIQUE: Multidetector CT imaging of the abdomen and pelvis was performed using the standard protocol following bolus administration of intravenous contrast. CONTRAST:  ISOVUE-300 IOPAMIDOL (ISOVUE-300) INJECTION 61% COMPARISON:  None. FINDINGS: Lower chest: Ground-glass opacities and bronchiectasis appear similar to chest CT 03/18/2015. No pleural fluid. No focal consolidation. The heart is normal in size. Hepatobiliary: Multiple gallstones within the gallbladder, including a stone in the gallbladder neck. Suggestion of gallbladder wall thickening measuring 6 mm. No biliary dilatation. No focal hepatic lesion. Pancreas: No ductal dilatation or inflammation. Spleen: Normal in size without focal abnormality. Adrenals/Urinary Tract: Adrenal glands are unremarkable. Kidneys are normal, without renal calculi, focal lesion, or  hydronephrosis. Bladder is unremarkable. Stomach/Bowel: Stomach decompressed. No bowel inflammation, distention or obstruction. Normal appendix. Small stool burden throughout the colon, no colonic wall thickening. Vascular/Lymphatic: No significant vascular findings are present. No enlarged abdominal or pelvic lymph nodes. Reproductive: Uterus and bilateral adnexa are unremarkable. Other: No free air, free fluid, or intra-abdominal fluid collection. Musculoskeletal: There are no acute or suspicious osseous abnormalities. IMPRESSION: Gallstones with suggestion of mild gallbladder wall thickening. Recommend right upper quadrant ultrasound for characterization. Electronically Signed   By: Rubye Oaks M.D.   On: 08/22/2016 22:10    Procedures Procedures (including critical care time)  Medications Ordered in ED Medications - No data to display   Initial Impression / Assessment and Plan / ED Course  I have reviewed the triage vital signs and the nursing notes.  Pertinent labs & imaging results that were available during my care of the patient were  reviewed by me and considered in my medical decision making (see chart for details).     46yF with abdominal pain. Likely symptomatic cholelithiasis. Stones noted on CT. Questionable wall thickening. Hard to define on CT. [place] unavailable at this hour. Clinically doubt cholecystitis. No peritonitis on exam. She does have a leukocytosis. She is afebrile though. LFTs normal. Lipase normal. Feeling better with pain/nausea meds. I think she is appropriate for DC at this time. PRN pain/nausea meds. General surgery follow-up. Return precautions discussed.   Final Clinical Impressions(s) / ED Diagnoses   Final diagnoses:  Calculus of gallbladder without cholecystitis without obstruction    New Prescriptions New Prescriptions   No medications on file   I personally preformed the services scribed in my presence. The recorded information has been reviewed  is accurate. Raeford Razor, MD.    Raeford Razor, MD 08/22/16 2216

## 2016-08-22 NOTE — ED Triage Notes (Signed)
Pt reports that her stomach started hurting when she woke up and worsened around 11 am. Pt reports that she has vomited approx 6 times. No diarrhea. Normal BM yesterday

## 2016-08-24 ENCOUNTER — Encounter (HOSPITAL_COMMUNITY): Payer: Self-pay | Admitting: *Deleted

## 2016-08-24 ENCOUNTER — Ambulatory Visit: Payer: 59 | Admitting: Pulmonary Disease

## 2016-08-24 ENCOUNTER — Inpatient Hospital Stay (HOSPITAL_COMMUNITY)
Admission: EM | Admit: 2016-08-24 | Discharge: 2016-08-27 | DRG: 419 | Disposition: A | Discharge status: Home / Self Care | Payer: 59 | Attending: General Surgery | Admitting: General Surgery

## 2016-08-24 DIAGNOSIS — K810 Acute cholecystitis: Secondary | ICD-10-CM

## 2016-08-24 DIAGNOSIS — Z79899 Other long term (current) drug therapy: Secondary | ICD-10-CM | POA: Diagnosis not present

## 2016-08-24 DIAGNOSIS — K8000 Calculus of gallbladder with acute cholecystitis without obstruction: Secondary | ICD-10-CM | POA: Diagnosis not present

## 2016-08-24 DIAGNOSIS — D869 Sarcoidosis, unspecified: Secondary | ICD-10-CM | POA: Diagnosis not present

## 2016-08-24 DIAGNOSIS — E669 Obesity, unspecified: Secondary | ICD-10-CM | POA: Diagnosis present

## 2016-08-24 DIAGNOSIS — Z8249 Family history of ischemic heart disease and other diseases of the circulatory system: Secondary | ICD-10-CM

## 2016-08-24 DIAGNOSIS — Z87891 Personal history of nicotine dependence: Secondary | ICD-10-CM | POA: Diagnosis not present

## 2016-08-24 DIAGNOSIS — Z6834 Body mass index (BMI) 34.0-34.9, adult: Secondary | ICD-10-CM

## 2016-08-24 DIAGNOSIS — Z9049 Acquired absence of other specified parts of digestive tract: Secondary | ICD-10-CM

## 2016-08-24 DIAGNOSIS — Z9851 Tubal ligation status: Secondary | ICD-10-CM | POA: Diagnosis not present

## 2016-08-24 HISTORY — DX: Acute cholecystitis: K81.0

## 2016-08-24 LAB — URINALYSIS, ROUTINE W REFLEX MICROSCOPIC
GLUCOSE, UA: NEGATIVE mg/dL
Ketones, ur: 80 mg/dL — AB
Nitrite: NEGATIVE
PH: 6 (ref 5.0–8.0)
Protein, ur: 100 mg/dL — AB
Specific Gravity, Urine: 1.032 — ABNORMAL HIGH (ref 1.005–1.030)

## 2016-08-24 LAB — I-STAT CHEM 8, ED
BUN: 11 mg/dL (ref 6–20)
CREATININE: 1.2 mg/dL — AB (ref 0.44–1.00)
Calcium, Ion: 1.1 mmol/L — ABNORMAL LOW (ref 1.15–1.40)
Chloride: 101 mmol/L (ref 101–111)
GLUCOSE: 117 mg/dL — AB (ref 65–99)
HEMATOCRIT: 42 % (ref 36.0–46.0)
Hemoglobin: 14.3 g/dL (ref 12.0–15.0)
POTASSIUM: 3.8 mmol/L (ref 3.5–5.1)
Sodium: 135 mmol/L (ref 135–145)
TCO2: 26 mmol/L (ref 0–100)

## 2016-08-24 LAB — COMPREHENSIVE METABOLIC PANEL
ALT: 20 U/L (ref 14–54)
AST: 27 U/L (ref 15–41)
Albumin: 3.2 g/dL — ABNORMAL LOW (ref 3.5–5.0)
Alkaline Phosphatase: 90 U/L (ref 38–126)
Anion gap: 9 (ref 5–15)
BUN: 10 mg/dL (ref 6–20)
CALCIUM: 8.9 mg/dL (ref 8.9–10.3)
CO2: 23 mmol/L (ref 22–32)
CREATININE: 1.24 mg/dL — AB (ref 0.44–1.00)
Chloride: 101 mmol/L (ref 101–111)
GFR calc non Af Amer: 51 mL/min — ABNORMAL LOW (ref >60)
GFR, EST AFRICAN AMERICAN: 59 mL/min — AB (ref >60)
Glucose, Bld: 119 mg/dL — ABNORMAL HIGH (ref 65–99)
Potassium: 4 mmol/L (ref 3.5–5.1)
SODIUM: 133 mmol/L — AB (ref 135–145)
Total Bilirubin: 1.2 mg/dL (ref 0.3–1.2)
Total Protein: 7.1 g/dL (ref 6.5–8.1)

## 2016-08-24 LAB — CBC
HCT: 40.3 % (ref 36.0–46.0)
Hemoglobin: 13.4 g/dL (ref 12.0–15.0)
MCH: 27.4 pg (ref 26.0–34.0)
MCHC: 33.3 g/dL (ref 30.0–36.0)
MCV: 82.4 fL (ref 78.0–100.0)
PLATELETS: 339 10*3/uL (ref 150–400)
RBC: 4.89 MIL/uL (ref 3.87–5.11)
RDW: 13.4 % (ref 11.5–15.5)
WBC: 31.8 10*3/uL — ABNORMAL HIGH (ref 4.0–10.5)

## 2016-08-24 LAB — I-STAT TROPONIN, ED: Troponin i, poc: 0.02 ng/mL (ref 0.00–0.08)

## 2016-08-24 LAB — I-STAT CG4 LACTIC ACID, ED: LACTIC ACID, VENOUS: 1.06 mmol/L (ref 0.5–1.9)

## 2016-08-24 LAB — LIPASE, BLOOD: Lipase: 17 U/L (ref 11–51)

## 2016-08-24 MED ORDER — ALBUTEROL SULFATE (2.5 MG/3ML) 0.083% IN NEBU: 2.5000 mg | INHALATION_SOLUTION | Freq: Four times a day (QID) | RESPIRATORY_TRACT | Status: DC | PRN

## 2016-08-24 MED ORDER — PANTOPRAZOLE SODIUM 40 MG IV SOLR
40.0000 mg | Freq: Every day | INTRAVENOUS | Status: DC
  Administered 2016-08-24 – 2016-08-26 (×3): 40 mg via INTRAVENOUS
  Filled 2016-08-24 (×3): qty 40

## 2016-08-24 MED ORDER — ENOXAPARIN SODIUM 40 MG/0.4ML ~~LOC~~ SOLN: 40.0000 mg | SUBCUTANEOUS | Status: DC

## 2016-08-24 MED ORDER — ONDANSETRON HCL 4 MG/2ML IJ SOLN
4.0000 mg | Freq: Once | INTRAMUSCULAR | Status: AC
  Administered 2016-08-24: 4 mg via INTRAVENOUS
  Filled 2016-08-24: qty 2

## 2016-08-24 MED ORDER — ONDANSETRON HCL 4 MG PO TABS
4.0000 mg | ORAL_TABLET | Freq: Four times a day (QID) | ORAL | Status: DC
  Administered 2016-08-25 – 2016-08-27 (×8): 4 mg via ORAL
  Filled 2016-08-24 (×8): qty 1

## 2016-08-24 MED ORDER — KCL IN DEXTROSE-NACL 20-5-0.45 MEQ/L-%-% IV SOLN
INTRAVENOUS | Status: DC
  Administered 2016-08-24 – 2016-08-25 (×2): 125 mL/h via INTRAVENOUS
  Filled 2016-08-24 (×2): qty 1000

## 2016-08-24 MED ORDER — ACETAMINOPHEN 650 MG RE SUPP
650.0000 mg | Freq: Once | RECTAL | Status: AC
  Administered 2016-08-24: 650 mg via RECTAL
  Filled 2016-08-24: qty 1

## 2016-08-24 MED ORDER — ONDANSETRON 4 MG PO TBDP: 4.0000 mg | ORAL_TABLET | Freq: Four times a day (QID) | ORAL | Status: DC | PRN

## 2016-08-24 MED ORDER — PIPERACILLIN-TAZOBACTAM 3.375 G IVPB 30 MIN
3.3750 g | Freq: Once | INTRAVENOUS | Status: AC
  Administered 2016-08-24: 3.375 g via INTRAVENOUS
  Filled 2016-08-24: qty 50

## 2016-08-24 MED ORDER — HYDROMORPHONE HCL 1 MG/ML IJ SOLN
1.0000 mg | INTRAMUSCULAR | Status: DC | PRN
  Administered 2016-08-25 (×3): 1 mg via INTRAVENOUS
  Filled 2016-08-24 (×3): qty 1

## 2016-08-24 MED ORDER — PIPERACILLIN-TAZOBACTAM 3.375 G IVPB
3.3750 g | Freq: Three times a day (TID) | INTRAVENOUS | Status: DC
  Administered 2016-08-25 – 2016-08-27 (×7): 3.375 g via INTRAVENOUS
  Filled 2016-08-24 (×9): qty 50

## 2016-08-24 MED ORDER — DEXTROSE 5 % IV SOLN
2.0000 g | Freq: Once | INTRAVENOUS | Status: AC
  Administered 2016-08-24: 2 g via INTRAVENOUS
  Filled 2016-08-24: qty 2

## 2016-08-24 MED ORDER — ONDANSETRON HCL 4 MG/2ML IJ SOLN: 4.0000 mg | Freq: Four times a day (QID) | INTRAMUSCULAR | Status: DC | PRN

## 2016-08-24 MED ORDER — SODIUM CHLORIDE 0.9 % IV BOLUS (SEPSIS)
1000.0000 mL | Freq: Once | INTRAVENOUS | Status: AC
  Administered 2016-08-24: 1000 mL via INTRAVENOUS

## 2016-08-24 MED ORDER — MORPHINE SULFATE (PF) 4 MG/ML IV SOLN
4.0000 mg | Freq: Once | INTRAVENOUS | Status: AC
  Administered 2016-08-24: 4 mg via INTRAVENOUS
  Filled 2016-08-24: qty 1

## 2016-08-24 NOTE — ED Provider Notes (Signed)
[facility]-EMERGENCY DEPT Provider Note   CSN: [id number] Arrival date & time: 08/24/16  1318  By signing my name below, I, Marnette Burgess Long, attest that this documentation has been prepared under the direction and in the presence of Jacalyn Lefevre, MD. Electronically Signed: Marnette Burgess Long, Scribe. 08/24/2016. 5:15 PM.  History   Chief Complaint Chief Complaint  Patient presents with  . Abdominal Pain   The history is provided by the patient and medical records. No language interpreter was used.    HPI Comments:  Hailey Ross is an obese 46 y.o. female with a PMHx of Sarcoidosis and PSHx of Tubal Ligation, who presents to the Emergency Department complaining of ongoing, gradually worsening, 10/10 RUQ abdominal pain onset two days ago. Per chart review, pt was seen at [facility] for same two days ago and Dx'd with cholelithiasis. She is scheduled for surgery next week with Dr. Lovell Sheehan, but presents today as she "cannot handle the pain anymore." Pt has associated symptoms of fever and nausea. She tried the Percocet and Zofran Rx'd at [facility] at home without any relief of her symptoms. Pt denies emesis, diarrhea, and any other complaints at this time.  Family said they called Dr. York Ram office, but did not get a call back.  This is why they came to [facility] instead of back to [facility].   Past Medical History:  Diagnosis Date  . Sarcoidosis    Patient Active Problem List   Diagnosis Date Noted  . Sarcoidosis 05/04/2015  . Bronchiectasis (HCC) 05/04/2015  . Smoker 02/12/2014   Past Surgical History:  Procedure Laterality Date  . TUBAL LIGATION  02/1993   OB History    No data available     Home Medications    Prior to Admission medications   Medication Sig Start Date End Date Taking? Authorizing Provider  albuterol (PROVENTIL HFA;VENTOLIN HFA) 108 (90 BASE) MCG/ACT inhaler Inhale 2 puffs into the lungs every 6 (six) hours as needed for wheezing or shortness of breath. 03/26/15  Yes Allayne Butcher B, PA-C  naproxen sodium (ALEVE) 220 MG tablet Take 220-440 mg by mouth daily as needed (for pain).   Yes [provider]  predniSONE (DELTASONE) 5 MG tablet Take 2 tablets (10 mg total) by mouth daily with breakfast. Patient taking differently: Take 5 mg by mouth daily with breakfast.  03/01/16  Yes Parrett, Tammy S, NP  ondansetron (ZOFRAN) 4 MG tablet Take 1 tablet (4 mg total) by mouth every 6 (six) hours. Patient not taking: Reported on 08/24/2016 08/22/16   Raeford Razor, MD  oxyCODONE-acetaminophen (PERCOCET/ROXICET) 5-325 MG tablet Take 1-2 tablets by mouth every 4 (four) hours as needed. Patient not taking: Reported on 08/24/2016 08/22/16   Raeford Razor, MD   Family History Family History  Problem Relation Age of Onset  . Hypertension Mother    Social History Social History  Substance Use Topics  . Smoking status: Former Smoker    Packs/day: 1.00    Years: 20.00    Types: Cigarettes    Quit date: 03/21/2015  . Smokeless tobacco: Never Used  . Alcohol use 0.0 oz/week     Comment: Rare   Allergies   Patient has no known allergies.   Review of Systems Review of Systems All systems reviewed and are negative for acute change except as noted in the HPI.    Physical Exam Updated Vital Signs BP 132/86 (BP Location: Right Arm)   Pulse 98   Temp 100.2 F (37.9 C) (Oral)  Resp 19   Ht 5\' 5"  (1.651 m)   Wt 210 lb (95.3 kg)   LMP 08/20/2016   SpO2 92%   BMI 34.95 kg/m   Physical Exam  Constitutional: She is oriented to person, place, and time. She appears well-developed and well-nourished.  HENT:  Head: Normocephalic.  Eyes: Conjunctivae are normal.  Cardiovascular: Normal rate.   Pulmonary/Chest: Effort normal.  Abdominal: She exhibits no distension. There is tenderness in the right upper quadrant.  Musculoskeletal: Normal range of motion.  Neurological: She is alert and oriented to person, place, and time.  Skin: Skin is warm and dry.    Psychiatric: She has a normal mood and affect.  Nursing note and vitals reviewed.   ED Treatments / Results  DIAGNOSTIC STUDIES:  Oxygen Saturation is 100% on RA, normal by my interpretation.    COORDINATION OF CARE:  5:15 PM Discussed treatment plan with pt at bedside including blood work, UA, IV Fluids, pain medication, Abx,and pt agreed to plan.  5:16 PM Consult to surgery placed.  Pt d/w Dr. Lindie Spruce (surgery) who will see pt.  Labs (all labs ordered are listed, but only abnormal results are displayed) Labs Reviewed  COMPREHENSIVE METABOLIC PANEL - Abnormal; Notable for the following:       Result Value   Sodium 133 (*)    Glucose, Bld 119 (*)    Creatinine, Ser 1.24 (*)    Albumin 3.2 (*)    GFR calc non Af Amer 51 (*)    GFR calc Af Amer 59 (*)    All other components within normal limits  CBC - Abnormal; Notable for the following:    WBC 31.8 (*)    All other components within normal limits  URINALYSIS, ROUTINE W REFLEX MICROSCOPIC - Abnormal; Notable for the following:    Color, Urine AMBER (*)    APPearance CLOUDY (*)    Specific Gravity, Urine 1.032 (*)    Hgb urine dipstick MODERATE (*)    Bilirubin Urine SMALL (*)    Ketones, ur 80 (*)    Protein, ur 100 (*)    Leukocytes, UA MODERATE (*)    Bacteria, UA RARE (*)    Squamous Epithelial / LPF TOO NUMEROUS TO COUNT (*)    All other components within normal limits  I-STAT CHEM 8, ED - Abnormal; Notable for the following:    Creatinine, Ser 1.20 (*)    Glucose, Bld 117 (*)    Calcium, Ion 1.10 (*)    All other components within normal limits  LIPASE, BLOOD  I-STAT TROPOININ, ED  I-STAT CG4 LACTIC ACID, ED  I-STAT CG4 LACTIC ACID, ED    EKG  EKG Interpretation None       Radiology Ct Abdomen Pelvis W Contrast  Result Date: 08/22/2016 CLINICAL DATA:  Progressive periumbilical and left-sided abdominal pain, onset this morning. Vomiting. EXAM: CT ABDOMEN AND PELVIS WITH CONTRAST TECHNIQUE:  Multidetector CT imaging of the abdomen and pelvis was performed using the standard protocol following bolus administration of intravenous contrast. CONTRAST:  ISOVUE-300 IOPAMIDOL (ISOVUE-300) INJECTION 61% COMPARISON:  None. FINDINGS: Lower chest: Ground-glass opacities and bronchiectasis appear similar to chest CT 03/18/2015. No pleural fluid. No focal consolidation. The heart is normal in size. Hepatobiliary: Multiple gallstones within the gallbladder, including a stone in the gallbladder neck. Suggestion of gallbladder wall thickening measuring 6 mm. No biliary dilatation. No focal hepatic lesion. Pancreas: No ductal dilatation or inflammation. Spleen: Normal in size without focal abnormality. Adrenals/Urinary Tract:  Adrenal glands are unremarkable. Kidneys are normal, without renal calculi, focal lesion, or hydronephrosis. Bladder is unremarkable. Stomach/Bowel: Stomach decompressed. No bowel inflammation, distention or obstruction. Normal appendix. Small stool burden throughout the colon, no colonic wall thickening. Vascular/Lymphatic: No significant vascular findings are present. No enlarged abdominal or pelvic lymph nodes. Reproductive: Uterus and bilateral adnexa are unremarkable. Other: No free air, free fluid, or intra-abdominal fluid collection. Musculoskeletal: There are no acute or suspicious osseous abnormalities. IMPRESSION: Gallstones with suggestion of mild gallbladder wall thickening. Recommend right upper quadrant ultrasound for characterization. Electronically Signed   By: Rubye OaksMelanie  Ehinger M.D.   On: 08/22/2016 22:10    Procedures Procedures (including critical care time)  Medications Ordered in ED Medications  piperacillin-tazobactam (ZOSYN) IVPB 3.375 g (not administered)  sodium chloride 0.9 % bolus 1,000 mL (1,000 mLs Intravenous New Bag/Given 08/24/16 1750)  morphine 4 MG/ML injection 4 mg (4 mg Intravenous Given 08/24/16 1749)  ondansetron (ZOFRAN) injection 4 mg (4 mg  Intravenous Given 08/24/16 1749)  cefTRIAXone (ROCEPHIN) 2 g in dextrose 5 % 50 mL IVPB (2 g Intravenous New Bag/Given 08/24/16 1749)  acetaminophen (TYLENOL) suppository 650 mg (650 mg Rectal Given 08/24/16 1750)     Initial Impression / Assessment and Plan / ED Course  I have reviewed the triage vital signs and the nursing notes.  Pertinent labs & imaging results that were available during my care of the patient were reviewed by me and considered in my medical decision making (see chart for details).     Dr. Lindie SpruceWyatt, surgery, did come to see pt and will admit her.  I gave her rocephin, but he requested zosyn so that was given.    Final Clinical Impressions(s) / ED Diagnoses   Final diagnoses:  Acute cholecystitis    New Prescriptions New Prescriptions   No medications on file    I personally performed the services described in this documentation, which was scribed in my presence. The recorded information has been reviewed and is accurate.     Jacalyn LefevreHaviland, Luca Dyar, MD 08/24/16 [phone]

## 2016-08-24 NOTE — ED Triage Notes (Signed)
Pt reports mid & R upper abd pain, pt dx with Gall[facility] on Monday at [facility], pt has scheduled surgery next week, pt reports intolerable pain, pt denies v/d, pt reports nausea, A&O x4

## 2016-08-24 NOTE — H&P (Signed)
Hailey Ross is an 46 y.o. female.   Chief Complaint: Abdominal pain, nausea and vomiting HPI: Patient with a history of sarcoidosis, seen in [facility] ED Monday where whe was found to have WBC 16K, abdominal pain that was persistent at the time of discharge, CT scan of the abdomen which showed a gallstone in the neck with thickening, was discharge.  Came to [facility] ED today with worsening symptoms, WBC up to 31K.  Fevers.  CT not repeated.  Surgery called.  Past Medical History:  Diagnosis Date  . Sarcoidosis     Past Surgical History:  Procedure Laterality Date  . TUBAL LIGATION  02/1993    Family History  Problem Relation Age of Onset  . Hypertension Mother    Social History:  reports that she quit smoking about 17 months ago. Her smoking use included Cigarettes. She has a 20.00 pack-year smoking history. She has never used smokeless tobacco. She reports that she drinks alcohol. She reports that she does not use drugs.  Allergies: No Known Allergies   (Not in a hospital admission)  Results for orders placed or performed during the hospital encounter of 08/24/16 (from the past 48 hour(s))  Lipase, blood     Status: None   Collection Time: 08/24/16  2:04 PM  Result Value Ref Range   Lipase 17 11 - 51 U/L  Comprehensive metabolic panel     Status: Abnormal   Collection Time: 08/24/16  2:04 PM  Result Value Ref Range   Sodium 133 (L) 135 - 145 mmol/L   Potassium 4.0 3.5 - 5.1 mmol/L   Chloride 101 101 - 111 mmol/L   CO2 23 22 - 32 mmol/L   Glucose, Bld 119 (H) 65 - 99 mg/dL   BUN 10 6 - 20 mg/dL   Creatinine, Ser 1.24 (H) 0.44 - 1.00 mg/dL   Calcium 8.9 8.9 - 10.3 mg/dL   Total Protein 7.1 6.5 - 8.1 g/dL   Albumin 3.2 (L) 3.5 - 5.0 g/dL   AST 27 15 - 41 U/L   ALT 20 14 - 54 U/L   Alkaline Phosphatase 90 38 - 126 U/L   Total Bilirubin 1.2 0.3 - 1.2 mg/dL   GFR calc non Af Amer 51 (L) >60 mL/min   GFR calc Af Amer 59 (L) >60 mL/min    Comment: (NOTE) The eGFR has been  calculated using the CKD EPI equation. This calculation has not been validated in all clinical situations. eGFR's persistently <60 mL/min signify possible Chronic Kidney Disease.    Anion gap 9 5 - 15  CBC     Status: Abnormal   Collection Time: 08/24/16  2:04 PM  Result Value Ref Range   WBC 31.8 (H) 4.0 - 10.5 K/uL   RBC 4.89 3.87 - 5.11 MIL/uL   Hemoglobin 13.4 12.0 - 15.0 g/dL   HCT 40.3 36.0 - 46.0 %   MCV 82.4 78.0 - 100.0 fL   MCH 27.4 26.0 - 34.0 pg   MCHC 33.3 30.0 - 36.0 g/dL   RDW 13.4 11.5 - 15.5 %   Platelets 339 150 - 400 K/uL  I-stat troponin, ED     Status: None   Collection Time: 08/24/16  2:20 PM  Result Value Ref Range   Troponin i, poc 0.02 0.00 - 0.08 ng/mL   Comment 3            Comment: Due to the release kinetics of cTnI, a negative result within the first hours  of the onset of symptoms does not rule out myocardial infarction with certainty. If myocardial infarction is still suspected, repeat the test at appropriate intervals.   I-stat chem 8, ed     Status: Abnormal   Collection Time: 08/24/16  2:22 PM  Result Value Ref Range   Sodium 135 135 - 145 mmol/L   Potassium 3.8 3.5 - 5.1 mmol/L   Chloride 101 101 - 111 mmol/L   BUN 11 6 - 20 mg/dL   Creatinine, Ser 1.20 (H) 0.44 - 1.00 mg/dL   Glucose, Bld 117 (H) 65 - 99 mg/dL   Calcium, Ion 1.10 (L) 1.15 - 1.40 mmol/L   TCO2 26 0 - 100 mmol/L   Hemoglobin 14.3 12.0 - 15.0 g/dL   HCT 42.0 36.0 - 46.0 %  Urinalysis, Routine w reflex microscopic     Status: Abnormal   Collection Time: 08/24/16  5:25 PM  Result Value Ref Range   Color, Urine AMBER (A) YELLOW    Comment: BIOCHEMICALS MAY BE AFFECTED BY COLOR   APPearance CLOUDY (A) CLEAR   Specific Gravity, Urine 1.032 (H) 1.005 - 1.030   pH 6.0 5.0 - 8.0   Glucose, UA NEGATIVE NEGATIVE mg/dL   Hgb urine dipstick MODERATE (A) NEGATIVE   Bilirubin Urine SMALL (A) NEGATIVE   Ketones, ur 80 (A) NEGATIVE mg/dL   Protein, ur 100 (A) NEGATIVE mg/dL    Nitrite NEGATIVE NEGATIVE   Leukocytes, UA MODERATE (A) NEGATIVE   RBC / HPF 6-30 0 - 5 RBC/hpf   WBC, UA 6-30 0 - 5 WBC/hpf   Bacteria, UA RARE (A) NONE SEEN   Squamous Epithelial / LPF TOO NUMEROUS TO COUNT (A) NONE SEEN   Mucous PRESENT   I-Stat CG4 Lactic Acid, ED     Status: None   Collection Time: 08/24/16  5:51 PM  Result Value Ref Range   Lactic Acid, Venous 1.06 0.5 - 1.9 mmol/L   Ct Abdomen Pelvis W Contrast  Result Date: 08/22/2016 CLINICAL DATA:  Progressive periumbilical and left-sided abdominal pain, onset this morning. Vomiting. EXAM: CT ABDOMEN AND PELVIS WITH CONTRAST TECHNIQUE: Multidetector CT imaging of the abdomen and pelvis was performed using the standard protocol following bolus administration of intravenous contrast. CONTRAST:  173m ISOVUE-300 IOPAMIDOL (ISOVUE-300) INJECTION 61% COMPARISON:  None. FINDINGS: Lower chest: Ground-glass opacities and bronchiectasis appear similar to chest CT 03/18/2015. No pleural fluid. No focal consolidation. The heart is normal in size. Hepatobiliary: Multiple gallstones within the gallbladder, including a stone in the gallbladder neck. Suggestion of gallbladder wall thickening measuring 6 mm. No biliary dilatation. No focal hepatic lesion. Pancreas: No ductal dilatation or inflammation. Spleen: Normal in size without focal abnormality. Adrenals/Urinary Tract: Adrenal glands are unremarkable. Kidneys are normal, without renal calculi, focal lesion, or hydronephrosis. Bladder is unremarkable. Stomach/Bowel: Stomach decompressed. No bowel inflammation, distention or obstruction. Normal appendix. Small stool burden throughout the colon, no colonic wall thickening. Vascular/Lymphatic: No significant vascular findings are present. No enlarged abdominal or pelvic lymph nodes. Reproductive: Uterus and bilateral adnexa are unremarkable. Other: No free air, free fluid, or intra-abdominal fluid collection. Musculoskeletal: There are no acute or  suspicious osseous abnormalities. IMPRESSION: Gallstones with suggestion of mild gallbladder wall thickening. Recommend right upper quadrant ultrasound for characterization. Electronically Signed   By: MJeb LeveringM.D.   On: 08/22/2016 22:10    Review of Systems  Constitutional: Positive for chills and fever.  Gastrointestinal: Positive for abdominal pain, nausea and vomiting.  All other systems reviewed and  are negative.   Blood pressure 132/86, pulse 98, temperature 100.2 F (37.9 C), temperature source Oral, resp. rate 19, height 5' 5"  (1.651 m), weight 95.3 kg (210 lb), last menstrual period 08/20/2016, SpO2 92 %. Physical Exam  Vitals reviewed. Constitutional: She is oriented to person, place, and time. She appears well-developed and well-nourished.  Over-weight  HENT:  Head: Normocephalic and atraumatic.  Eyes: Conjunctivae and EOM are normal. Pupils are equal, round, and reactive to light.  Neck: Normal range of motion. Neck supple.  Cardiovascular: Normal rate, regular rhythm and normal heart sounds.   Respiratory: Effort normal and breath sounds normal.  GI: Soft. Bowel sounds are decreased. There is tenderness in the right upper quadrant. There is rebound, guarding and positive Murphy's sign. There is no rigidity.    Musculoskeletal: Normal range of motion.  Neurological: She is alert and oriented to person, place, and time.  Skin: Skin is warm and dry.  Psychiatric: She has a normal mood and affect. Her behavior is normal. Judgment and thought content normal.     Assessment/Plan Palpable GB with WBC 31K, acute cholecystitis, no diffuse peritonitis. Concerned about WBC so high, but the patient is not clinically septic.  Normal blood pressure, Mildly febrile. Admit for hydration, IV antibiotics and likely cholecystectomy in the near future.   Judeth Horn, MD 08/24/2016, 8:40 PM

## 2016-08-25 ENCOUNTER — Encounter (HOSPITAL_COMMUNITY): Admission: EM | Disposition: A | Discharge status: Home / Self Care | Payer: Self-pay | Source: Home / Self Care

## 2016-08-25 ENCOUNTER — Inpatient Hospital Stay (HOSPITAL_COMMUNITY): Payer: 59 | Admitting: Certified Registered Nurse Anesthetist

## 2016-08-25 ENCOUNTER — Encounter (HOSPITAL_COMMUNITY): Payer: Self-pay | Admitting: Certified Registered"

## 2016-08-25 ENCOUNTER — Ambulatory Visit: Payer: 59 | Admitting: General Surgery

## 2016-08-25 HISTORY — PX: CHOLECYSTECTOMY: SHX55

## 2016-08-25 LAB — CBC
HEMATOCRIT: 36.9 % (ref 36.0–46.0)
HEMOGLOBIN: 11.9 g/dL — AB (ref 12.0–15.0)
MCH: 26.6 pg (ref 26.0–34.0)
MCHC: 32.2 g/dL (ref 30.0–36.0)
MCV: 82.6 fL (ref 78.0–100.0)
Platelets: 333 10*3/uL (ref 150–400)
RBC: 4.47 MIL/uL (ref 3.87–5.11)
RDW: 13.5 % (ref 11.5–15.5)
WBC: 28.9 10*3/uL — AB (ref 4.0–10.5)

## 2016-08-25 LAB — COMPREHENSIVE METABOLIC PANEL
ALBUMIN: 2.6 g/dL — AB (ref 3.5–5.0)
ALT: 17 U/L (ref 14–54)
ANION GAP: 9 (ref 5–15)
AST: 24 U/L (ref 15–41)
Alkaline Phosphatase: 85 U/L (ref 38–126)
BILIRUBIN TOTAL: 1.1 mg/dL (ref 0.3–1.2)
BUN: 9 mg/dL (ref 6–20)
CALCIUM: 8.1 mg/dL — AB (ref 8.9–10.3)
CO2: 23 mmol/L (ref 22–32)
Chloride: 102 mmol/L (ref 101–111)
Creatinine, Ser: 1.12 mg/dL — ABNORMAL HIGH (ref 0.44–1.00)
GFR calc non Af Amer: 58 mL/min — ABNORMAL LOW (ref >60)
GLUCOSE: 132 mg/dL — AB (ref 65–99)
POTASSIUM: 3.5 mmol/L (ref 3.5–5.1)
SODIUM: 134 mmol/L — AB (ref 135–145)
TOTAL PROTEIN: 6.6 g/dL (ref 6.5–8.1)

## 2016-08-25 LAB — SURGICAL PCR SCREEN
MRSA, PCR: NEGATIVE
STAPHYLOCOCCUS AUREUS: NEGATIVE

## 2016-08-25 SURGERY — LAPAROSCOPIC CHOLECYSTECTOMY
Anesthesia: General | Site: Abdomen

## 2016-08-25 MED ORDER — PROPOFOL 10 MG/ML IV BOLUS
INTRAVENOUS | Status: AC
  Filled 2016-08-25: qty 20

## 2016-08-25 MED ORDER — MIDAZOLAM HCL 2 MG/2ML IJ SOLN
INTRAMUSCULAR | Status: AC
  Filled 2016-08-25: qty 2

## 2016-08-25 MED ORDER — KCL IN DEXTROSE-NACL 20-5-0.45 MEQ/L-%-% IV SOLN
INTRAVENOUS | Status: DC
  Administered 2016-08-25 – 2016-08-27 (×3): via INTRAVENOUS
  Filled 2016-08-25 (×4): qty 1000

## 2016-08-25 MED ORDER — FENTANYL CITRATE (PF) 250 MCG/5ML IJ SOLN
INTRAMUSCULAR | Status: AC
  Filled 2016-08-25: qty 5

## 2016-08-25 MED ORDER — ACETAMINOPHEN 650 MG RE SUPP
650.0000 mg | RECTAL | Status: DC | PRN
  Administered 2016-08-25: 650 mg via RECTAL
  Filled 2016-08-25: qty 1

## 2016-08-25 MED ORDER — PHENYLEPHRINE 40 MCG/ML (10ML) SYRINGE FOR IV PUSH (FOR BLOOD PRESSURE SUPPORT)
PREFILLED_SYRINGE | INTRAVENOUS | Status: AC
  Filled 2016-08-25: qty 10

## 2016-08-25 MED ORDER — ONDANSETRON HCL 4 MG/2ML IJ SOLN
INTRAMUSCULAR | Status: AC
  Filled 2016-08-25: qty 2

## 2016-08-25 MED ORDER — DEXAMETHASONE SODIUM PHOSPHATE 10 MG/ML IJ SOLN
INTRAMUSCULAR | Status: AC
  Filled 2016-08-25: qty 1

## 2016-08-25 MED ORDER — ROCURONIUM BROMIDE 10 MG/ML (PF) SYRINGE
PREFILLED_SYRINGE | INTRAVENOUS | Status: DC | PRN
  Administered 2016-08-25: 10 mg via INTRAVENOUS
  Administered 2016-08-25: 35 mg via INTRAVENOUS

## 2016-08-25 MED ORDER — BUPIVACAINE HCL (PF) 0.25 % IJ SOLN
INTRAMUSCULAR | Status: DC | PRN
  Administered 2016-08-25: 30 mL

## 2016-08-25 MED ORDER — ACETAMINOPHEN 10 MG/ML IV SOLN
1000.0000 mg | Freq: Once | INTRAVENOUS | Status: AC
  Administered 2016-08-25: 1000 mg via INTRAVENOUS

## 2016-08-25 MED ORDER — LIDOCAINE 2% (20 MG/ML) 5 ML SYRINGE
INTRAMUSCULAR | Status: DC | PRN
  Administered 2016-08-25: 60 mg via INTRAVENOUS

## 2016-08-25 MED ORDER — PROMETHAZINE HCL 25 MG/ML IJ SOLN: 6.2500 mg | INTRAMUSCULAR | Status: DC | PRN

## 2016-08-25 MED ORDER — OXYCODONE HCL 5 MG PO TABS: 5.0000 mg | ORAL_TABLET | Freq: Once | ORAL | Status: DC | PRN

## 2016-08-25 MED ORDER — ALBUMIN HUMAN 5 % IV SOLN
INTRAVENOUS | Status: DC | PRN
  Administered 2016-08-25: 15:00:00 via INTRAVENOUS

## 2016-08-25 MED ORDER — OXYCODONE HCL 5 MG/5ML PO SOLN: 5.0000 mg | Freq: Once | ORAL | Status: DC | PRN

## 2016-08-25 MED ORDER — ROCURONIUM BROMIDE 10 MG/ML (PF) SYRINGE
PREFILLED_SYRINGE | INTRAVENOUS | Status: AC
  Filled 2016-08-25: qty 5

## 2016-08-25 MED ORDER — MIDAZOLAM HCL 5 MG/5ML IJ SOLN
INTRAMUSCULAR | Status: DC | PRN
  Administered 2016-08-25: 2 mg via INTRAVENOUS

## 2016-08-25 MED ORDER — PHENYLEPHRINE HCL 10 MG/ML IJ SOLN
INTRAMUSCULAR | Status: DC | PRN
  Administered 2016-08-25: 80 ug via INTRAVENOUS

## 2016-08-25 MED ORDER — 0.9 % SODIUM CHLORIDE (POUR BTL) OPTIME
TOPICAL | Status: DC | PRN
  Administered 2016-08-25: 1000 mL

## 2016-08-25 MED ORDER — PROPOFOL 10 MG/ML IV BOLUS
INTRAVENOUS | Status: DC | PRN
  Administered 2016-08-25: 100 mg via INTRAVENOUS

## 2016-08-25 MED ORDER — LIDOCAINE 2% (20 MG/ML) 5 ML SYRINGE
INTRAMUSCULAR | Status: AC
  Filled 2016-08-25: qty 5

## 2016-08-25 MED ORDER — FENTANYL CITRATE (PF) 100 MCG/2ML IJ SOLN
INTRAMUSCULAR | Status: DC | PRN
  Administered 2016-08-25: 150 ug via INTRAVENOUS

## 2016-08-25 MED ORDER — ACETAMINOPHEN 10 MG/ML IV SOLN
INTRAVENOUS | Status: AC
  Filled 2016-08-25: qty 100

## 2016-08-25 MED ORDER — HYDROMORPHONE HCL 1 MG/ML IJ SOLN: 0.2500 mg | INTRAMUSCULAR | Status: DC | PRN

## 2016-08-25 MED ORDER — SUGAMMADEX SODIUM 200 MG/2ML IV SOLN
INTRAVENOUS | Status: AC
  Filled 2016-08-25: qty 2

## 2016-08-25 MED ORDER — DEXAMETHASONE SODIUM PHOSPHATE 10 MG/ML IJ SOLN
INTRAMUSCULAR | Status: DC | PRN
  Administered 2016-08-25: 10 mg via INTRAVENOUS

## 2016-08-25 MED ORDER — BUPIVACAINE HCL (PF) 0.25 % IJ SOLN
INTRAMUSCULAR | Status: AC
  Filled 2016-08-25: qty 30

## 2016-08-25 MED ORDER — LACTATED RINGERS IV SOLN
INTRAVENOUS | Status: DC
  Administered 2016-08-25 (×2): via INTRAVENOUS

## 2016-08-25 MED ORDER — SODIUM CHLORIDE 0.9 % IR SOLN
Status: DC | PRN
  Administered 2016-08-25: 1000 mL

## 2016-08-25 MED ORDER — OXYCODONE HCL 5 MG PO TABS
5.0000 mg | ORAL_TABLET | ORAL | Status: DC | PRN
  Administered 2016-08-26 (×3): 10 mg via ORAL
  Filled 2016-08-25 (×3): qty 2

## 2016-08-25 SURGICAL SUPPLY — 42 items
ADH SKN CLS APL DERMABOND .7 (GAUZE/BANDAGES/DRESSINGS) ×1
APPLIER CLIP 5 13 M/L LIGAMAX5 (MISCELLANEOUS) ×2
APR CLP MED LRG 5 ANG JAW (MISCELLANEOUS) ×1
BAG SPEC RTRVL LRG 6X4 10 (ENDOMECHANICALS) ×1
CANISTER SUCT 3000ML PPV (MISCELLANEOUS) ×2 IMPLANT
CHLORAPREP W/TINT 26ML (MISCELLANEOUS) ×2 IMPLANT
CLIP APPLIE 5 13 M/L LIGAMAX5 (MISCELLANEOUS) ×1 IMPLANT
COVER SURGICAL LIGHT HANDLE (MISCELLANEOUS) ×2 IMPLANT
DERMABOND ADVANCED (GAUZE/BANDAGES/DRESSINGS) ×1
DERMABOND ADVANCED .7 DNX12 (GAUZE/BANDAGES/DRESSINGS) ×1 IMPLANT
ELECT REM PT RETURN 9FT ADLT (ELECTROSURGICAL) ×2
ELECTRODE REM PT RTRN 9FT ADLT (ELECTROSURGICAL) ×1 IMPLANT
GLOVE BIOGEL PI IND STRL 7.5 (GLOVE) IMPLANT
GLOVE BIOGEL PI INDICATOR 7.5 (GLOVE) ×2
GLOVE ECLIPSE 7.5 STRL STRAW (GLOVE) ×2 IMPLANT
GLOVE ECLIPSE 8.0 STRL XLNG CF (GLOVE) ×1 IMPLANT
GLOVE SURG SIGNA 7.5 PF LTX (GLOVE) ×1 IMPLANT
GLOVE SURG SYN 7.5  E (GLOVE) ×1
GLOVE SURG SYN 7.5 E (GLOVE) ×1 IMPLANT
GLOVE SURG SYN 7.5 PF PI (GLOVE) IMPLANT
GOWN STRL REUS W/ TWL LRG LVL3 (GOWN DISPOSABLE) ×2 IMPLANT
GOWN STRL REUS W/ TWL XL LVL3 (GOWN DISPOSABLE) ×1 IMPLANT
GOWN STRL REUS W/TWL LRG LVL3 (GOWN DISPOSABLE) ×4
GOWN STRL REUS W/TWL XL LVL3 (GOWN DISPOSABLE) ×4
GUIDING SHEATH PERFORM XLG 20F (SHEATH) ×2
KIT BASIN OR (CUSTOM PROCEDURE TRAY) ×2 IMPLANT
KIT ROOM TURNOVER OR (KITS) ×2 IMPLANT
NS IRRIG 1000ML POUR BTL (IV SOLUTION) ×2 IMPLANT
PAD ARMBOARD 7.5X6 YLW CONV (MISCELLANEOUS) ×2 IMPLANT
POUCH SPECIMEN RETRIEVAL 10MM (ENDOMECHANICALS) ×2 IMPLANT
SCISSORS LAP 5X35 DISP (ENDOMECHANICALS) ×2 IMPLANT
SET IRRIG TUBING LAPAROSCOPIC (IRRIGATION / IRRIGATOR) ×2 IMPLANT
SHEATH GUIDING PERFORM XLG 20F (SHEATH) IMPLANT
SLEEVE ENDOPATH XCEL 5M (ENDOMECHANICALS) ×4 IMPLANT
SPECIMEN JAR SMALL (MISCELLANEOUS) ×2 IMPLANT
SUT MNCRL AB 4-0 PS2 18 (SUTURE) ×2 IMPLANT
TOWEL OR 17X24 6PK STRL BLUE (TOWEL DISPOSABLE) ×2 IMPLANT
TOWEL OR 17X26 10 PK STRL BLUE (TOWEL DISPOSABLE) ×2 IMPLANT
TRAY LAPAROSCOPIC MC (CUSTOM PROCEDURE TRAY) ×2 IMPLANT
TROCAR XCEL BLUNT TIP 100MML (ENDOMECHANICALS) ×2 IMPLANT
TROCAR XCEL NON-BLD 5MMX100MML (ENDOMECHANICALS) ×2 IMPLANT
TUBING INSUFFLATION (TUBING) ×2 IMPLANT

## 2016-08-25 NOTE — Anesthesia Preprocedure Evaluation (Addendum)
Anesthesia Evaluation  Patient identified by MRN, date of birth, ID band Patient awake    Reviewed: Allergy & Precautions, NPO status , Patient's Chart, lab work & pertinent test results  Airway Mallampati: II  TM Distance: >3 FB Neck ROM: Full    Dental no notable dental hx.    Pulmonary former smoker,    Pulmonary exam normal breath sounds clear to auscultation       Cardiovascular negative cardio ROS Normal cardiovascular exam Rhythm:Regular Rate:Normal     Neuro/Psych negative neurological ROS  negative psych ROS   GI/Hepatic negative GI ROS, Neg liver ROS,   Endo/Other  negative endocrine ROS  Renal/GU negative Renal ROS  negative genitourinary   Musculoskeletal negative musculoskeletal ROS (+)   Abdominal   Peds negative pediatric ROS (+)  Hematology negative hematology ROS (+)   Anesthesia Other Findings Obese sarcoidosis  Reproductive/Obstetrics negative OB ROS s/p tubal ligation                             Anesthesia Physical Anesthesia Plan  ASA: II  Anesthesia Plan: General   Post-op Pain Management:    Induction: Intravenous  Airway Management Planned: Oral ETT  Additional Equipment:   Intra-op Plan: Utilization Of Total Body Hypothermia per surgeon request  Post-operative Plan: Extubation in OR  Informed Consent: I have reviewed the patients History and Physical, chart, labs and discussed the procedure including the risks, benefits and alternatives for the proposed anesthesia with the patient or authorized representative who has indicated his/her understanding and acceptance.   Dental advisory given  Plan Discussed with: CRNA and Surgeon  Anesthesia Plan Comments:        Anesthesia Quick Evaluation

## 2016-08-25 NOTE — Progress Notes (Signed)
Patient ID: Hailey Ross, female   DOB: [birth year], 46 y.o.   MRN: [id number]   Patient with gallstones, acute cholecystitis Plan lap chole today. I discussed the procedure in detail.  We discussed the risks and benefits of a laparoscopic cholecystectomy and possible cholangiogram including, but not limited to bleeding, infection, injury to surrounding structures such as the intestine or liver, bile leak, retained gallstones, need to convert to an open procedure, prolonged diarrhea, blood clots such as  DVT, common bile duct injury, anesthesia risks, and possible need for additional procedures.  The likelihood of improvement in symptoms and return to the patient's normal status is good. We discussed the typical post-operative recovery course.

## 2016-08-25 NOTE — Transfer of Care (Signed)
Immediate Anesthesia Transfer of Care Note  Patient: Vivi FernsKimberly R Daniels  Procedure(s) Performed: Procedure(s): LAPAROSCOPIC CHOLECYSTECTOMY (N/A)  Patient Location: PACU  Anesthesia Type:General  Level of Consciousness: awake, oriented and patient cooperative  Airway & Oxygen Therapy: Patient Spontanous Breathing and Patient connected to nasal cannula oxygen  Post-op Assessment: Report given to RN, Post -op Vital signs reviewed and stable and Patient moving all extremities  Post vital signs: Reviewed and stable  Last Vitals:  Vitals:   08/25/16 0900 08/25/16 1503  BP: (!) 128/91   Pulse: (!) 120   Resp: 18   Temp: (!) 38.4 C (P) 36.9 C    Last Pain:  Vitals:   08/25/16 0946  TempSrc:   PainSc: 6          Complications: No apparent anesthesia complications

## 2016-08-25 NOTE — Op Note (Signed)
Laparoscopic Cholecystectomy Procedure Note  Indications: This patient presents with symptomatic gallbladder disease and will undergo laparoscopic cholecystectomy.  Pre-operative Diagnosis: acute cholecystitis with cholelithiasis  Post-operative Diagnosis: Same  Surgeon: Abigail Miyamoto A   Assistants: 0  Anesthesia: General endotracheal anesthesia  ASA Class: 2  Procedure Details  The patient was seen again in the Holding Room. The risks, benefits, complications, treatment options, and expected outcomes were discussed with the patient. The possibilities of reaction to medication, pulmonary aspiration, perforation of viscus, bleeding, recurrent infection, finding a normal gallbladder, the need for additional procedures, failure to diagnose a condition, the possible need to convert to an open procedure, and creating a complication requiring transfusion or operation were discussed with the patient. The likelihood of improving the patient's symptoms with return to their baseline status is good.  The patient and/or family concurred with the proposed plan, giving informed consent. The site of surgery properly noted. The patient was taken to Operating Room, identified as Hailey Ross and the procedure verified as Laparoscopic Cholecystectomy with Intraoperative Cholangiogram. A Time Out was held and the above information confirmed.  Prior to the induction of general anesthesia, antibiotic prophylaxis was administered. General endotracheal anesthesia was then administered and tolerated well. After the induction, the abdomen was prepped with Chloraprep and draped in sterile fashion. The patient was positioned in the supine position.  Local anesthetic agent was injected into the skin near the umbilicus and an incision made. We dissected down to the abdominal fascia with blunt dissection.  The fascia was incised vertically and we entered the peritoneal cavity bluntly.  A pursestring suture of  0-Vicryl was placed around the fascial opening.  The Hasson cannula was inserted and secured with the stay suture.  Pneumoperitoneum was then created with CO2 and tolerated well without any adverse changes in the patient's vital signs. A 5-mm port was placed in the subxiphoid position.  Two 5-mm ports were placed in the right upper quadrant. All skin incisions were infiltrated with a local anesthetic agent before making the incision and placing the trocars.   We positioned the patient in reverse Trendelenburg, tilted slightly to the patient's left.  The gallbladder was acutely inflamed and distended. I had to aspirate bile from the gallbladder and were to facilitate grasping the gallbladder. the fundus grasped and retracted cephalad. Adhesions were lysed bluntly and with the electrocautery where indicated, taking care not to injure any adjacent organs or viscus. The infundibulum was grasped and retracted laterally, exposing the peritoneum overlying the triangle of Calot. This was then divided and exposed in a blunt fashion. The cystic duct was clearly identified and bluntly dissected circumferentially. A critical view of the cystic duct and cystic artery was obtained.  The cystic duct was then ligated with clips and divided. The cystic artery was, dissected free, ligated with clips and divided as well.   The gallbladder was dissected from the liver bed in retrograde fashion with the electrocautery. The gallbladder was removed and placed in an Endocatch sac. The liver bed was irrigated and inspected. Hemostasis was achieved with the electrocautery. Copious irrigation was utilized and was repeatedly aspirated until clear.  The gallbladder and Endocatch sac were then removed through the umbilical port site.  The pursestring suture was used to close the umbilical fascia.    We again inspected the right upper quadrant for hemostasis.  Pneumoperitoneum was released as we removed the trocars.  4-0 Monocryl was used  to close the skin.   Skin glue was then applied.  The patient was then extubated and brought to the recovery room in stable condition. Instrument, sponge, and needle counts were correct at closure and at the conclusion of the case.   Findings: Cholecystitis with Cholelithiasis, stone impacted in neck of gallbladder  Estimated Blood Loss: Minimal         Drains: 0         Specimens: Gallbladder           Complications: None; patient tolerated the procedure well.         Disposition: PACU - hemodynamically stable.         Condition: stable

## 2016-08-25 NOTE — Progress Notes (Signed)
Report called to Lindsay in short stay.  

## 2016-08-25 NOTE — Anesthesia Postprocedure Evaluation (Addendum)
Anesthesia Post Note  Patient: Hailey Ross  Procedure(s) Performed: Procedure(s) (LRB): LAPAROSCOPIC CHOLECYSTECTOMY (N/A)  Patient location during evaluation: PACU Anesthesia Type: General Level of consciousness: sedated Pain management: pain level controlled Vital Signs Assessment: post-procedure vital signs reviewed and stable Respiratory status: spontaneous breathing and respiratory function stable Cardiovascular status: stable Anesthetic complications: no       Last Vitals:  Vitals:   08/25/16 1603 08/25/16 1615  BP: 124/87   Pulse: (!) 118 (!) 108  Resp: (!) 27 (!) 38  Temp:      Last Pain:  Vitals:   08/25/16 1600  TempSrc:   PainSc: 0-No pain                 Feleica Fulmore DANIEL

## 2016-08-25 NOTE — Anesthesia Procedure Notes (Signed)
Procedure Name: Intubation Performed by: Melina Copa, Austin Pongratz R Pre-anesthesia Checklist: Patient identified, Emergency Drugs available, Suction available and Patient being monitored Patient Re-evaluated:Patient Re-evaluated prior to inductionOxygen Delivery Method: Circle System Utilized Preoxygenation: Pre-oxygenation with 100% oxygen Intubation Type: IV induction Ventilation: Mask ventilation without difficulty Laryngoscope Size: Mac and 3 Grade View: Grade I Tube type: Oral Tube size: 7.5 mm Number of attempts: 1 Airway Equipment and Method: Stylet Placement Confirmation: ETT inserted through vocal cords under direct vision,  positive ETCO2 and breath sounds checked- equal and bilateral Secured at: 21 cm Tube secured with: Tape Dental Injury: Teeth and Oropharynx as per pre-operative assessment

## 2016-08-26 ENCOUNTER — Encounter (HOSPITAL_COMMUNITY): Payer: Self-pay | Admitting: Surgery

## 2016-08-26 MED ORDER — ONDANSETRON HCL 4 MG PO TABS: 4.0000 mg | ORAL_TABLET | Freq: Four times a day (QID) | ORAL | 0 refills | Status: DC | PRN

## 2016-08-26 MED ORDER — OXYCODONE HCL 5 MG PO TABS: 5.0000 mg | ORAL_TABLET | ORAL | 0 refills | Status: DC | PRN

## 2016-08-26 NOTE — Discharge Summary (Signed)
[facility] Discharge Summary   Patient ID: Hailey FernsKimberly R Daniels MRN: [id number] DOB/AGE: Dec 14, 1970 46 y.o.  Admit date: 08/24/2016 Discharge date: 08/26/2016  Admitting Diagnosis: Acute Cholecystitis  Discharge Diagnosis Patient Active Problem List   Diagnosis Date Noted  . Acute cholecystitis 08/24/2016  . Sarcoidosis 05/04/2015  . Bronchiectasis (HCC) 05/04/2015  . Smoker 02/12/2014    Consultants None  Procedures Dr. Magnus IvanBlackman (08/25/16) - [facility]  Hospital Course:  Patient is a 46 y.o. female [facility], [facility].  Workup showed acute cholecystitis.  Patient was admitted and underwent procedure listed above.  Tolerated procedure well and was transferred to the floor.  Diet was advanced as tolerated.  On POD#1, the patient was voiding well, tolerating diet, ambulating well, pain well controlled, vital signs stable, incisions c/d/i and felt stable for discharge home.  Patient will follow up in our office in 2 weeks and knows to call with questions or concerns. She will call to confirm appointment date/time.     Allergies as of 08/26/2016   No Known Allergies     Medication List    STOP taking these medications   oxyCODONE-acetaminophen 5-325 MG tablet Commonly known as:  PERCOCET/ROXICET     TAKE these medications   albuterol 108 (90 Base) MCG/ACT inhaler Commonly known as:  PROVENTIL HFA;VENTOLIN HFA Inhale 2 puffs into the lungs every 6 (six) hours as needed for wheezing or shortness of breath.   ALEVE 220 MG tablet Generic drug:  naproxen sodium Take 220-440 mg by mouth daily as needed (for pain).   ondansetron 4 MG tablet Commonly known as:  ZOFRAN Take 1 tablet (4 mg total) by mouth every 6 (six) hours as needed for nausea or vomiting. What changed:  when to take this  reasons to take this   oxyCODONE 5 MG immediate release tablet Commonly known as:  Oxy IR/ROXICODONE Take 1-2 tablets  (5-10 mg total) by mouth every 4 (four) hours as needed for moderate pain.   predniSONE 5 MG tablet Commonly known as:  DELTASONE Take 2 tablets (10 mg total) by mouth daily with breakfast. What changed:  how much to take        Follow-up Information    [facility] Follow up on 09/13/2016.   Specialty:  General Surgery Why:  Your appointment is at 3:30 PM. Please arrive 30 min prior to appointment time for check-in. Bring photo ID and insurance information.  Contact information: [address] [place] [address] [phone]           Signed: Wells GuilesKelly Rayburn, [facility] 08/26/2016, 11:29 AM Pager: [phone] Consults: [phone] Mon-Fri 7:00 am-4:30 pm Sat-Sun 7:00 am-11:30 am

## 2016-08-26 NOTE — Progress Notes (Signed)
[facility] Progress Note  1 Day Post-Op  Subjective: CC: abdominal soreness Patient doing well postoperatively. Became sick with clear liquids once last night, but currently denies N/V. Pain is well controlled and minimal. Patient ambulating well, UOP good. No flatus or BM yet. VSS.  Objective: Vital signs in last 24 hours: Temp:  [97.8 F (36.6 C)-101.2 F (38.4 C)] 97.8 F (36.6 C) (05/18 0607) Pulse Rate:  [74-134] 74 (05/18 0607) Resp:  [17-47] 20 (05/18 0607) BP: (114-137)/(64-91) 114/64 (05/18 0607) SpO2:  [73 %-99 %] 95 % (05/18 0607) FiO2 (%):  [2 %] 2 % (05/18 0205) Last BM Date: 08/21/16  Intake/Output from previous day: 05/17 0701 - 05/18 0700 In: 1835 [I.V.:1535; IV Piggyback:300] Out: 525 [Urine:500; Blood:25] Intake/Output this shift: No intake/output data recorded.  PE: Gen:  Alert, NAD, pleasant Card:  Regular rate and rhythm Pulm:  Normal effort Abd: Soft, minimally TTP, non-distended, bowel sounds present in all 4 quadrants, incisions C/D/I Skin: warm and dry, no rashes  Psych: A&Ox3   Lab Results:   Recent Labs  08/24/16 1404 08/24/16 1422 08/25/16 0455  WBC 31.8*  --  28.9*  HGB 13.4 14.3 11.9*  HCT 40.3 42.0 36.9  PLT 339  --  333   BMET  Recent Labs  08/24/16 1404 08/24/16 1422 08/25/16 0455  NA 133* 135 134*  K 4.0 3.8 3.5  CL 101 101 102  CO2 23  --  23  GLUCOSE 119* 117* 132*  BUN 10 11 9   CREATININE 1.24* 1.20* 1.12*  CALCIUM 8.9  --  8.1*   PT/INR No results for input(s): LABPROT, INR in the last 72 hours. CMP     Component Value Date/Time   NA 134 (L) 08/25/2016 0455   K 3.5 08/25/2016 0455   CL 102 08/25/2016 0455   CO2 23 08/25/2016 0455   GLUCOSE 132 (H) 08/25/2016 0455   BUN 9 08/25/2016 0455   CREATININE 1.12 (H) 08/25/2016 0455   CREATININE 1.17 (H) 02/17/2014 0831   CALCIUM 8.1 (L) 08/25/2016 0455   PROT 6.6 08/25/2016 0455   ALBUMIN 2.6 (L) 08/25/2016 0455   AST 24 08/25/2016 0455   ALT  17 08/25/2016 0455   ALKPHOS 85 08/25/2016 0455   BILITOT 1.1 08/25/2016 0455   GFRNONAA 58 (L) 08/25/2016 0455   GFRNONAA 57 (L) 02/17/2014 0831   GFRAA >60 08/25/2016 0455   GFRAA 66 02/17/2014 0831   Lipase     Component Value Date/Time   LIPASE 17 08/24/2016 1404    Anti-infectives: Anti-infectives    Start     Dose/Rate Route Frequency Ordered Stop   08/25/16 0400  piperacillin-tazobactam (ZOSYN) IVPB 3.375 g     3.375 g 12.5 mL/hr over 240 Minutes Intravenous Every 8 hours 08/24/16 2231     08/24/16 1845  piperacillin-tazobactam (ZOSYN) IVPB 3.375 g     3.375 g 100 mL/hr over 30 Minutes Intravenous  Once 08/24/16 1838 08/24/16 1928   08/24/16 1730  cefTRIAXone (ROCEPHIN) 2 g in dextrose 5 % 50 mL IVPB     2 g 100 mL/hr over 30 Minutes Intravenous  Once 08/24/16 1715 08/24/16 1855       Assessment/Plan Acute Cholecystitis S/P laparoscopic cholecystectomy - POD#1 patient is healing appropriately - VSS, clinically patient is improving - As long as patient is able to tolerate breakfast she may be able to go home later this afternoon - she will need to follow up in our office in 2-3 weeks  LOS: 2 days    Wells GuilesKelly Rayburn , [facility] 08/26/2016, 7:58 AM Pager: [phone] Consults: [phone] Mon-Fri 7:00 am-4:30 pm Sat-Sun 7:00 am-11:30 am

## 2016-08-26 NOTE — Care Management Note (Signed)
Case Management Note  Patient Details  Name: Hailey FernsKimberly R Daniels MRN: [id number] Date of Birth: March 21, 1971  Subjective/Objective:                    Action/Plan:  Lap chole  HX sarcoidois Patient is NOT on home oxygen . No needs identified  Expected Discharge Date:  08/26/16               Expected Discharge Plan:  Home/Self Care  In-House Referral:     Discharge planning Services     Post Acute Care Choice:    Choice offered to:  Patient  DME Arranged:    DME Agency:     HH Arranged:    HH Agency:     Status of Service:  In process, will continue to follow  If discussed at Long Length of Stay Meetings, dates discussed:    Additional Comments:  Kingsley PlanWile, Graves Nipp Marie, RN 08/26/2016, 10:30 AM

## 2016-08-26 NOTE — Discharge Instructions (Signed)
please arrive at least 30 min before your appointment to complete your check in paperwork.  If you are unable to arrive 30 min prior to your appointment time we may have to cancel or reschedule you. ° °LAPAROSCOPIC SURGERY: POST OP INSTRUCTIONS  °1. DIET: Follow a light bland diet the first 24 hours after arrival home, such as soup, liquids, crackers, etc. Be sure to include lots of fluids daily. Avoid fast food or heavy meals as your are more likely to get nauseated. Eat a low fat the next few days after surgery.  °2. Take your usually prescribed home medications unless otherwise directed. °3. PAIN CONTROL:  °1. Pain is best controlled by a usual combination of three different methods TOGETHER:  °1. Ice/Heat °2. Over the counter pain medication °3. Prescription pain medication °2. Most patients will experience some swelling and bruising around the incisions. Ice packs or heating pads (30-60 minutes up to 6 times a day) will help. Use ice for the first few days to help decrease swelling and bruising, then switch to heat to help relax tight/sore spots and speed recovery. Some people prefer to use ice alone, heat alone, alternating between ice & heat. Experiment to what works for you. Swelling and bruising can take several weeks to resolve.  °3. It is helpful to take an over-the-counter pain medication regularly for the first few weeks. Choose one of the following that works best for you:  °1. Naproxen (Aleve, etc) Two 220mg tabs twice a day °2. Ibuprofen (Advil, etc) Three 200mg tabs four times a day (every meal & bedtime) °3. Acetaminophen (Tylenol, etc) 500-650mg four times a day (every meal & bedtime) °4. A prescription for pain medication (such as oxycodone, hydrocodone, etc) should be given to you upon discharge. Take your pain medication as prescribed.  °1. If you are having problems/concerns with the prescription medicine (does not control pain, nausea, vomiting, rash, itching, etc), please call us [phone]  [phone] to see if we need to switch you to a different pain medicine that will work better for you and/or control your side effect better. °2. If you need a refill on your pain medication, please contact your pharmacy. They will contact our office to request authorization. Prescriptions will not be filled after 5 pm or on week-ends. °4. Avoid getting constipated. Between the surgery and the pain medications, it is common to experience some constipation. Increasing fluid intake and taking a fiber supplement (such as Metamucil, Citrucel, FiberCon, MiraLax, etc) 1-2 times a day regularly will usually help prevent this problem from occurring. A mild laxative (prune juice, Milk of Magnesia, MiraLax, etc) should be taken according to package directions if there are no bowel movements after 48 hours.  °5. Watch out for diarrhea. If you have many loose bowel movements, simplify your diet to bland foods & liquids for a few days. Stop any stool softeners and decrease your fiber supplement. Switching to mild anti-diarrheal medications (Kayopectate, Pepto Bismol) can help. If this worsens or does not improve, please call us. °6. Wash / shower every day. You may shower over the dressings as they are waterproof. Continue to shower over incision(s) after the dressing is off. °7. Remove your waterproof bandages 5 days after surgery. You may leave the incision open to air. You may replace a dressing/Band-Aid to cover the incision for comfort if you wish.  °8. ACTIVITIES as tolerated:  °1. You may resume regular (light) daily activities beginning the next day--such as daily self-care, walking, climbing stairs--gradually   increasing activities as tolerated. If you can walk 30 minutes without difficulty, it is safe to try more intense activity such as jogging, treadmill, bicycling, low-impact aerobics, swimming, etc. °2. Save the most intensive and strenuous activity for last such as sit-ups, heavy lifting, contact sports, etc Refrain  from any heavy lifting or straining until you are off narcotics for pain control.  °3. DO NOT PUSH THROUGH PAIN. Let pain be your guide: If it hurts to do something, don't do it. Pain is your body warning you to avoid that activity for another week until the pain goes down. °4. You may drive when you are no longer taking prescription pain medication, you can comfortably wear a seatbelt, and you can safely maneuver your car and apply brakes. °5. You may have sexual intercourse when it is comfortable.  °9. FOLLOW UP in our office  °1. Please call [facility] at [phone] to set up an appointment to see your surgeon in the office for a follow-up appointment approximately 2-3 weeks after your surgery. °2. Make sure that you call for this appointment the day you arrive home to insure a convenient appointment time. °     10. IF YOU HAVE DISABILITY OR FAMILY LEAVE FORMS, BRING THEM TO THE               OFFICE FOR PROCESSING.  ° °WHEN TO CALL US [phone]:  °1. Poor pain control °2. Reactions / problems with new medications (rash/itching, nausea, etc)  °3. Fever over 101.5 F (38.5 C) °4. Inability to urinate °5. Nausea and/or vomiting °6. Worsening swelling or bruising °7. Continued bleeding from incision. °8. Increased pain, redness, or drainage from the incision ° °The clinic staff is available to answer your questions during regular business hours (8:30am-5pm). Please don’t hesitate to call and ask to speak to one of our nurses for clinical concerns.  °If you have a medical emergency, go to the nearest emergency room or call 911.  °A surgeon from [facility] is always on call at the hospitals  ° °[facility]  °[address], [address], [place], [place] [address] ?  °MAIN: [phone] ? TOLL FREE: [phone] ?  °FAX [phone]  °[email or web address] ° °

## 2016-08-27 MED ORDER — OXYCODONE-ACETAMINOPHEN 5-325 MG PO TABS: 1.0000 | ORAL_TABLET | Freq: Four times a day (QID) | ORAL | 0 refills | Status: DC | PRN

## 2016-08-27 NOTE — Discharge Summary (Addendum)
Physician Discharge Summary  Patient ID: Hailey Ross MRN: [id number] DOB/AGE: [birth year] 46 y.o.  Admit date: 08/24/2016 Discharge date: 08/27/2016  Admission Diagnoses: acute cholecystitis  Discharge Diagnoses:  Active Problems:   Acute cholecystitis s/p lap chole  Discharged Condition: good  Hospital Course: Pt was admitted with acute cholecysitits.  She underwetn urgent Lap chole.  Post op she was transitioned forma cLD to a reg diet.  She had some n/v POD 1, but this resolved by POD 2.  She was having mormal bowel fucntion.  She was afebrile, deemed stable for DC and DC'd h ome.   Consults: none  Significant Diagnostic Studies: none  Treatments: surgery: as above  Discharge Exam: Blood pressure 125/79, pulse 81, temperature 98 F (36.7 C), temperature source Oral, resp. rate 18, height 5\' 5"  (1.651 m), weight 95.3 kg (210 lb 1.6 oz), last menstrual period 08/20/2016, SpO2 100 %. General appearance: alert and cooperative GI: soft, non-tender; bowel sounds normal; no masses,  no organomegaly  Disposition: 01-Home or Self Care  Discharge Instructions    Diet - low sodium heart healthy    Complete by:  As directed    Increase activity slowly    Complete by:  As directed      Allergies as of 08/27/2016   No Known Allergies     Medication List    STOP taking these medications   oxyCODONE-acetaminophen 5-325 MG tablet Commonly known as:  PERCOCET/ROXICET     TAKE these medications   albuterol 108 (90 Base) MCG/ACT inhaler Commonly known as:  PROVENTIL HFA;VENTOLIN HFA Inhale 2 puffs into the lungs every 6 (six) hours as needed for wheezing or shortness of breath.   ALEVE 220 MG tablet Generic drug:  naproxen sodium Take 220-440 mg by mouth daily as needed (for pain).   ondansetron 4 MG tablet Commonly known as:  ZOFRAN Take 1 tablet (4 mg total) by mouth every 6 (six) hours as needed for nausea or vomiting. What changed:  when to take this  reasons  to take this   oxyCODONE 5 MG immediate release tablet Commonly known as:  Oxy IR/ROXICODONE Take 1-2 tablets (5-10 mg total) by mouth every 4 (four) hours as needed for moderate pain.   predniSONE 5 MG tablet Commonly known as:  DELTASONE Take 2 tablets (10 mg total) by mouth daily with breakfast. What changed:  how much to take      Follow-up Information    [facility] Follow up on 09/13/2016.   Specialty:  General Surgery Why:  Your appointment is at 3:30 PM. Please arrive 30 min prior to appointment time for check-in. Bring photo ID and insurance information.  Contact information: [address] [place] [address] [phone]           Added PErcocet to DC Rx  Signed: Marigene EhlersRamirez Jr., Jed Limerickrmando 08/27/2016, 10:41 AM

## 2016-08-30 LAB — CULTURE, BLOOD (ROUTINE X 2)
CULTURE: NO GROWTH
Culture: NO GROWTH
SPECIAL REQUESTS: ADEQUATE
Special Requests: ADEQUATE

## 2016-09-01 ENCOUNTER — Ambulatory Visit: Payer: 59 | Admitting: General Surgery

## 2016-09-10 NOTE — Addendum Note (Signed)
Addendum  created 09/10/16 1015 by Venezia Sargeant, MD   Sign clinical note    

## 2016-10-17 ENCOUNTER — Ambulatory Visit: Payer: 59 | Admitting: Pulmonary Disease

## 2016-10-24 ENCOUNTER — Encounter: Payer: Self-pay | Admitting: Pulmonary Disease

## 2016-10-24 ENCOUNTER — Ambulatory Visit (INDEPENDENT_AMBULATORY_CARE_PROVIDER_SITE_OTHER): Payer: 59 | Admitting: Pulmonary Disease

## 2016-10-24 VITALS — BP 128/82 | HR 74 | Ht 65.0 in | Wt 205.0 lb

## 2016-10-24 DIAGNOSIS — J479 Bronchiectasis, uncomplicated: Secondary | ICD-10-CM | POA: Diagnosis not present

## 2016-10-24 DIAGNOSIS — J45909 Unspecified asthma, uncomplicated: Secondary | ICD-10-CM | POA: Diagnosis not present

## 2016-10-24 DIAGNOSIS — D860 Sarcoidosis of lung: Secondary | ICD-10-CM | POA: Diagnosis not present

## 2016-10-24 NOTE — Progress Notes (Signed)
Current Outpatient Prescriptions on File Prior to Visit  Medication Sig  . albuterol (PROVENTIL HFA;VENTOLIN HFA) 108 (90 BASE) MCG/ACT inhaler Inhale 2 puffs into the lungs every 6 (six) hours as needed for wheezing or shortness of breath.  . naproxen sodium (ALEVE) 220 MG tablet Take 220-440 mg by mouth daily as needed (for pain).  . predniSONE (DELTASONE) 5 MG tablet Take 2 tablets (10 mg total) by mouth daily with breakfast. (Patient taking differently: Take 5 mg by mouth daily with breakfast. )   No current facility-administered medications on file prior to visit.      Chief Complaint  Patient presents with  . Follow-up    Pt states that her breathing has been okay since last OV. Pt notes an increased cough x 1 month.      Pulmonary tests CT chest 03/18/15 >> biapical thickening with traction BTX, BTX RML and lingula, subcarinal LAN 1.7 cm, patchy increased interstitial markings Labs 04/21/15 >> HIV negative, Quantiferon gold negative, ACE 195, ANA, RF negative, ESR 32 PFT 05/26/15 >> FEV1 1.67 (66%), FEV1% 88, TLC 2/78 (53%), DLCO 36%  Past medical hx, Past surgical hx, Allergies, Family hx, Social hx all reviewed.  Vital Signs BP 128/82 (BP Location: Left Arm, Cuff Size: Normal)   Pulse 74   Ht 5' 5"  (1.651 m)   Wt 205 lb (93 kg)   SpO2 99%   BMI 34.11 kg/m   History of Present Illness Hailey Ross is a 46 y.o. 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  She had gallbladder surgery since last visit, and had to reschedule appointment.  She is down to 5 mg prednisone daily.  She gets intermittent cough.  Otherwise has been feeling well.  Denies skin rash, sinus congestion, sore throat, wheeze, chest tightness, or fever.  Physical Exam  General - pleasant Eyes - pupils reactive ENT - no sinus tenderness, no oral exudate, no LAN Cardiac - regular, no murmur Chest - no wheeze, rales Abd - soft, non tender Ext - no edema Skin - no rashes Neuro - normal  strength Psych - normal mood   CMP Latest Ref Rng & Units 08/25/2016 08/24/2016 08/24/2016  Glucose 65 - 99 mg/dL 132(H) 117(H) 119(H)  BUN 6 - 20 mg/dL 9 11 10   Creatinine 0.44 - 1.00 mg/dL 1.12(H) 1.20(H) 1.24(H)  Sodium 135 - 145 mmol/L 134(L) 135 133(L)  Potassium 3.5 - 5.1 mmol/L 3.5 3.8 4.0  Chloride 101 - 111 mmol/L 102 101 101  CO2 22 - 32 mmol/L 23 - 23  Calcium 8.9 - 10.3 mg/dL 8.1(L) - 8.9  Total Protein 6.5 - 8.1 g/dL 6.6 - 7.1  Total Bilirubin 0.3 - 1.2 mg/dL 1.1 - 1.2  Alkaline Phos 38 - 126 U/L 85 - 90  AST 15 - 41 U/L 24 - 27  ALT 14 - 54 U/L 17 - 20    CBC Latest Ref Rng & Units 08/25/2016 08/24/2016 08/24/2016  WBC 4.0 - 10.5 K/uL 28.9(H) - 31.8(H)  Hemoglobin 12.0 - 15.0 g/dL 11.9(L) 14.3 13.4  Hematocrit 36.0 - 46.0 % 36.9 42.0 40.3  Platelets 150 - 400 K/uL 333 - 339      Assessment/Plan  Pulmonary sarcoidosis with bronchiectasis. - continue to wean prednisone off over next several weeks  Asthmatic bronchitis. - prn albuterol  Upper airway cough with post nasal drip. - monitor clinically   Patient Instructions  Prednisone 5 mg pill >> 1 whole pill alternating with 1/2 pill every other day for  3 weeks.  If no change in symptoms, then 1 pill every other day for 3 days.  Again if no change in symptoms, then stop prednisone  Follow up in 3 months    Chesley Mires, MD [facility] Pager:  [phone] 10/24/2016

## 2016-10-24 NOTE — Patient Instructions (Signed)
Prednisone 5 mg pill >> 1 whole pill alternating with 1/2 pill every other day for 3 weeks.  If no change in symptoms, then 1 pill every other day for 3 days.  Again if no change in symptoms, then stop prednisone  Follow up in 3 months

## 2016-12-19 ENCOUNTER — Encounter: Payer: Self-pay | Admitting: Physician Assistant

## 2017-01-19 ENCOUNTER — Encounter: Payer: Self-pay | Admitting: Physician Assistant

## 2017-01-27 ENCOUNTER — Ambulatory Visit: Payer: 59 | Admitting: Pulmonary Disease

## 2017-03-06 ENCOUNTER — Encounter: Payer: Self-pay | Admitting: Pulmonary Disease

## 2017-03-06 ENCOUNTER — Ambulatory Visit (INDEPENDENT_AMBULATORY_CARE_PROVIDER_SITE_OTHER): Payer: 59 | Admitting: Pulmonary Disease

## 2017-03-06 VITALS — BP 118/78 | HR 70 | Ht 65.0 in | Wt 209.0 lb

## 2017-03-06 DIAGNOSIS — J479 Bronchiectasis, uncomplicated: Secondary | ICD-10-CM

## 2017-03-06 DIAGNOSIS — D860 Sarcoidosis of lung: Secondary | ICD-10-CM

## 2017-03-06 DIAGNOSIS — J45909 Unspecified asthma, uncomplicated: Secondary | ICD-10-CM | POA: Diagnosis not present

## 2017-03-06 MED ORDER — FLUTICASONE FUROATE 100 MCG/ACT IN AEPB: 1.0000 | INHALATION_SPRAY | Freq: Every day | RESPIRATORY_TRACT | 5 refills | Status: DC

## 2017-03-06 NOTE — Progress Notes (Signed)
Current Outpatient Medications on File Prior to Visit  Medication Sig  . albuterol (PROVENTIL HFA;VENTOLIN HFA) 108 (90 BASE) MCG/ACT inhaler Inhale 2 puffs into the lungs every 6 (six) hours as needed for wheezing or shortness of breath.  . naproxen sodium (ALEVE) 220 MG tablet Take 220-440 mg by mouth daily as needed (for pain).   No current facility-administered medications on file prior to visit.      Chief Complaint  Patient presents with  . Follow-up    Pt has more of a productive cough-yellow mucus. Pt has some chest tightness, and has some SOB with exertion.     Pulmonary tests CT chest 03/18/15 >> biapical thickening with traction BTX, BTX RML and lingula, subcarinal LAN 1.7 cm, patchy increased interstitial markings Labs 04/21/15 >> HIV negative, Quantiferon gold negative, ACE 195, ANA, RF negative, ESR 32 PFT 05/26/15 >> FEV1 1.67 (66%), FEV1% 88, TLC 2/78 (53%), DLCO 36%  Past medical history, Family history, Social history, Allergies reviewed  Vital Signs BP 118/78 (BP Location: Left Arm, Cuff Size: Normal)   Pulse 70   Ht 5' 5" (1.651 m)   Wt 209 lb (94.8 kg)   SpO2 98%   BMI 34.78 kg/m   History of Present Illness Hailey Ross is a 46 y.o. 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  Since her last visit she weaned off prednisone.  She has noticed getting episodes of cough and chest congestion.  She will get some wheeze.  She brings up clear to yellow sputum.  She notices more cough with change in temperature or humidity, and strong smells.  She denies sinus congestion, sore throat, difficulty swallowing, chest pain, fever, skin rash, or leg swelling.  Physical Exam  General - pleasant Eyes - pupils reactive ENT - no sinus tenderness, no oral exudate, no LAN Cardiac - regular, no murmur Chest - no wheeze, rales Abd - soft, non tender Ext - no edema Skin - no rashes Neuro - normal strength Psych - normal  mood  Assessment/Plan  Asthmatic bronchitis. - I think most of her current issues are related to this - will have her try arnuity - continue prn albuterol  Pulmonary sarcoidosis with bronchiectasis. - monitor off prednisone - if her symptoms don't improve with additional of arnuity, then might need to repeat imaging studies and determine if she needs to resume prednisone   Patient Instructions  Arnuity one puff daily and rinse mouth after each use  Follow up in 6 months   Chesley Mires, MD [place] [facility] Pager:  [phone] 03/06/2017, 3:36 PM

## 2017-03-06 NOTE — Patient Instructions (Signed)
Arnuity one puff daily and rinse mouth after each use  Follow up in 6 months

## 2017-06-12 ENCOUNTER — Encounter: Payer: Self-pay | Admitting: Adult Health

## 2017-06-12 ENCOUNTER — Ambulatory Visit (INDEPENDENT_AMBULATORY_CARE_PROVIDER_SITE_OTHER): Payer: 59 | Admitting: Adult Health

## 2017-06-12 ENCOUNTER — Ambulatory Visit (INDEPENDENT_AMBULATORY_CARE_PROVIDER_SITE_OTHER)
Admission: RE | Admit: 2017-06-12 | Discharge: 2017-06-12 | Disposition: A | Discharge status: Home / Self Care | Payer: 59 | Source: Ambulatory Visit | Attending: Adult Health | Admitting: Adult Health

## 2017-06-12 ENCOUNTER — Other Ambulatory Visit (INDEPENDENT_AMBULATORY_CARE_PROVIDER_SITE_OTHER): Payer: 59

## 2017-06-12 DIAGNOSIS — J471 Bronchiectasis with (acute) exacerbation: Secondary | ICD-10-CM | POA: Diagnosis not present

## 2017-06-12 DIAGNOSIS — D869 Sarcoidosis, unspecified: Secondary | ICD-10-CM | POA: Diagnosis not present

## 2017-06-12 LAB — CBC WITH DIFFERENTIAL/PLATELET
BASOS PCT: 0.5 % (ref 0.0–3.0)
Basophils Absolute: 0.1 10*3/uL (ref 0.0–0.1)
EOS ABS: 0.3 10*3/uL (ref 0.0–0.7)
Eosinophils Relative: 2.4 % (ref 0.0–5.0)
HCT: 39.1 % (ref 36.0–46.0)
HEMOGLOBIN: 13 g/dL (ref 12.0–15.0)
Lymphocytes Relative: 22.7 % (ref 12.0–46.0)
Lymphs Abs: 2.6 10*3/uL (ref 0.7–4.0)
MCHC: 33.3 g/dL (ref 30.0–36.0)
MCV: 82.1 fl (ref 78.0–100.0)
MONO ABS: 0.9 10*3/uL (ref 0.1–1.0)
Monocytes Relative: 7.7 % (ref 3.0–12.0)
NEUTROS ABS: 7.8 10*3/uL — AB (ref 1.4–7.7)
NEUTROS PCT: 66.7 % (ref 43.0–77.0)
PLATELETS: 474 10*3/uL — AB (ref 150.0–400.0)
RBC: 4.76 Mil/uL (ref 3.87–5.11)
RDW: 13.6 % (ref 11.5–15.5)
WBC: 11.7 10*3/uL — ABNORMAL HIGH (ref 4.0–10.5)

## 2017-06-12 NOTE — Assessment & Plan Note (Signed)
Flare  Plan  Patient Instructions  Finish doxycycline as discussed Prednisone taper over next week Mucinex DM twice daily as needed for cough congestion Chest x-ray today Fluids and rest Follow-up with Dr. Craige CottaSood as planned and As needed   Please contact office for sooner follow up if symptoms do not improve or worsen or seek emergency care

## 2017-06-12 NOTE — Addendum Note (Signed)
Addended by: Boone MasterJONES, Shaterra Sanzone E on: 06/12/2017 10:05 AM   Modules accepted: Orders

## 2017-06-12 NOTE — Patient Instructions (Signed)
Finish doxycycline as discussed Prednisone taper over next week Mucinex DM twice daily as needed for cough congestion Chest x-ray today Fluids and rest Follow-up with Dr. Craige Cotta as planned and As needed   Please contact office for sooner follow up if symptoms do not improve or worsen or seek emergency care

## 2017-06-12 NOTE — Progress Notes (Signed)
 @Patient ID: Hailey Ross, female    DOB: 11/05/1970, 47 y.o.   MRN: [id number]  Chief Complaint  Patient presents with  . Follow-up    Sarcoid     Referring provider: Dixon, Mary B, PA-C  HPI: 47-year-old female former smoker followed for pulmonary sarcoidosis with bronchiectasis  Pulmonary tests CT chest 03/18/15 >> biapical thickening with traction BTX, BTX RML and lingula, subcarinal LAN 1.7 cm, patchy increased interstitial markings Labs 04/21/15 >> HIV negative, Quantiferon gold negative, ACE 195, ANA, RF negative, ESR 32 PFT 05/26/15 >> FEV1 1.67 (66%), FEV1% 88, TLC 2/78 (53%), DLCO 36%  06/12/2017 Acute : Sarcoid /Bronchiectasis  Patient presents for an acute office visit. She has underlying sarcoid with bronchiectasis.  Complains over the last 10 days that she has had cough congestion drainage and increased shortness of breath.  Patient was seen at her primary care doctors.  Started on doxycycline.  Patient says cough is some better but she continues to cough up yellow mucus.  And has some increased shortness of breath.  She denies any hemoptysis, orthopnea, fever, chest pain edema nausea vomiting.    No Known Allergies  Immunization History  Administered Date(s) Administered  . Pneumococcal Polysaccharide-23 02/12/2014  . Tdap 02/12/2014    Past Medical History:  Diagnosis Date  . Sarcoidosis     Tobacco History: Social History   Tobacco Use  Smoking Status Former Smoker  . Packs/day: 1.00  . Years: 20.00  . Pack years: 20.00  . Types: Cigarettes  . Last attempt to quit: 03/21/2015  . Years since quitting: 2.2  Smokeless Tobacco Never Used   Counseling given: Not Answered   Outpatient Encounter Medications as of 06/12/2017  Medication Sig  . albuterol (PROVENTIL HFA;VENTOLIN HFA) 108 (90 BASE) MCG/ACT inhaler Inhale 2 puffs into the lungs every 6 (six) hours as needed for wheezing or shortness of breath.  . Fluticasone Furoate (ARNUITY ELLIPTA) 100  MCG/ACT AEPB Inhale 1 puff into the lungs daily.  . naproxen sodium (ALEVE) 220 MG tablet Take 220-440 mg by mouth daily as needed (for pain).   No facility-administered encounter medications on file as of 06/12/2017.      Review of Systems  Constitutional:   No  weight loss, night sweats,  Fevers, chills, fatigue, or  lassitude.  HEENT:   No headaches,  Difficulty swallowing,  Tooth/dental problems, or  Sore throat,                No sneezing, itching, ear ache,  +nasal congestion, post nasal drip,   CV:  No chest pain,  Orthopnea, PND, swelling in lower extremities, anasarca, dizziness, palpitations, syncope.   GI  No heartburn, indigestion, abdominal pain, nausea, vomiting, diarrhea, change in bowel habits, loss of appetite, bloody stools.   Resp: No chest wall deformity  Skin: no rash or lesions.  GU: no dysuria, change in color of urine, no urgency or frequency.  No flank pain, no hematuria   MS:  No joint pain or swelling.  No decreased range of motion.  No back pain.    Physical Exam  BP 110/72 (BP Location: Left Arm, Cuff Size: Normal)   Pulse 85   Ht 5' 5" (1.651 m)   Wt 200 lb (90.7 kg)   SpO2 100%   BMI 33.28 kg/m   GEN: A/Ox3; pleasant , NAD, obese   HEENT:  [place]/AT,  EACs-clear, TMs-wnl, NOSE-clear, THROAT-clear, no lesions, no postnasal drip or exudate noted.   NECK:    Supple w/ fair ROM; no JVD; normal carotid impulses w/o bruits; no thyromegaly or nodules palpated; no lymphadenopathy.    RESP  Clear  P & A; w/o, wheezes/ rales/ or rhonchi. no accessory muscle use, no dullness to percussion  CARD:  RRR, no m/r/g, no peripheral edema, pulses intact, no cyanosis or clubbing.  GI:   Soft & nt; nml bowel sounds; no organomegaly or masses detected.   Musco: Warm bil, no deformities or joint swelling noted.   Neuro: alert, no focal deficits noted.    Skin: Warm, no lesions or rashes    Lab Results:  CBC   BNP No results found for: BNP  ProBNP No  results found for: PROBNP  Imaging: No results found.   Assessment & Plan:   Bronchiectasis ([place]) Flare -slow to resolve  Check cxr today   Plan  Patient Instructions  Finish doxycycline as discussed Prednisone taper over next week Mucinex DM twice daily as needed for cough congestion Chest x-ray today Fluids and rest Follow-up with Dr. Halford Chessman as planned and As needed   Please contact office for sooner follow up if symptoms do not improve or worsen or seek emergency care       Sarcoidosis ([place]) Flare  Plan  Patient Instructions  Finish doxycycline as discussed Prednisone taper over next week Mucinex DM twice daily as needed for cough congestion Chest x-ray today Fluids and rest Follow-up with Dr. Halford Chessman as planned and As needed   Please contact office for sooner follow up if symptoms do not improve or worsen or seek emergency care          Rexene Edison, NP 06/12/2017

## 2017-06-12 NOTE — Assessment & Plan Note (Signed)
Flare -slow to resolve  Check cxr today   Plan  Patient Instructions  Finish doxycycline as discussed Prednisone taper over next week Mucinex DM twice daily as needed for cough congestion Chest x-ray today Fluids and rest Follow-up with Dr. Craige CottaSood as planned and As needed   Please contact office for sooner follow up if symptoms do not improve or worsen or seek emergency care

## 2017-08-11 ENCOUNTER — Ambulatory Visit (INDEPENDENT_AMBULATORY_CARE_PROVIDER_SITE_OTHER): Payer: 59 | Admitting: Pulmonary Disease

## 2017-08-11 ENCOUNTER — Encounter: Payer: Self-pay | Admitting: Pulmonary Disease

## 2017-08-11 VITALS — BP 118/82 | HR 73 | Ht 65.0 in | Wt 195.0 lb

## 2017-08-11 DIAGNOSIS — D860 Sarcoidosis of lung: Secondary | ICD-10-CM | POA: Diagnosis not present

## 2017-08-11 DIAGNOSIS — J45909 Unspecified asthma, uncomplicated: Secondary | ICD-10-CM

## 2017-08-11 MED ORDER — ALBUTEROL SULFATE (2.5 MG/3ML) 0.083% IN NEBU: 2.5000 mg | INHALATION_SOLUTION | Freq: Four times a day (QID) | RESPIRATORY_TRACT | 12 refills | Status: DC | PRN

## 2017-08-11 NOTE — Progress Notes (Signed)
[facility]  Chief Complaint  Patient presents with  . Follow-up    4 week f/u per patient. States her breathing has been doing ok since last visit.     Vital signs: BP 118/82 (BP Location: Left Arm, Patient Position: Sitting, Cuff Size: Normal)   Pulse 73   Ht _0  (1.651 m)   Wt 195 lb (88.5 kg)   SpO2 92%   BMI 32.45 kg/m   History of Present Illness: Hailey Ross is a 47 y.o. female with pulmonary sarcoidosis and bronchiectasis.  She was treated for an exacerbation last month.  Improved.  Still has intermittent cough with clear to yellow sputum.  Denies fever, chest pain, or hemoptysis.  No skin rash or swelling.  Physical Exam:  General - pleasant Eyes - pupils reactive ENT - no sinus tenderness, no oral exudate, no LAN Cardiac - regular, no murmur Chest - no wheeze, rales Abd - soft, non tender Ext - no edema Skin - no rashes Neuro - normal strength Psych - normal mood  Assessment/Plan:  Pulmonary sarcoidosis with bronchiectasis. - continue arnuity - prn albuterol - will arrange for home nebulizer - prn mucinex - if she has trouble with expectorating phlegm, then consider adding flutter valve    Patient Instructions  Will arrange for home nebulizer Albuterol one vial nebulized every 4 to 6 hours as needed for cough, wheeze, or chest congestion  Follow up in 6 months    Chesley Mires, MD [place] 08/11/2017, 3:49 PM  Flow Sheet  Pulmonary tests: CT chest 03/18/15 >> biapical thickening with traction BTX, BTX RML and lingula, subcarinal LAN 1.7 cm, patchy increased interstitial markings Labs 04/21/15 >> HIV negative, Quantiferon gold negative, ACE 195, ANA, RF negative, ESR 32 PFT 05/26/15 >> FEV1 1.67 (66%), FEV1% 88, TLC 2/78 (53%), DLCO 36%  Past Medical History: She  has a past medical history of Sarcoidosis.  Past Surgical History: She  has a past surgical history that  includes Tubal ligation (02/1993) and Cholecystectomy (N/A, 08/25/2016).  Family History: Her family history includes Hypertension in her mother.  Social History: She  reports that she quit smoking about 2 years ago. Her smoking use included cigarettes. She has a 20.00 pack-year smoking history. She has never used smokeless tobacco. She reports that she drinks alcohol. She reports that she does not use drugs.  Medications: Allergies as of 08/11/2017   No Known Allergies     Medication List        Accurate as of 08/11/17  3:49 PM. Always use your most recent med list.          albuterol 108 (90 Base) MCG/ACT inhaler Commonly known as:  PROVENTIL HFA;VENTOLIN HFA Inhale 2 puffs into the lungs every 6 (six) hours as needed for wheezing or shortness of breath.   albuterol (2.5 MG/3ML) 0.083% nebulizer solution Commonly known as:  PROVENTIL Take 3 mLs (2.5 mg total) by nebulization every 6 (six) hours as needed for wheezing or shortness of breath.   ALEVE 220 MG tablet Generic drug:  naproxen sodium Take 220-440 mg by mouth daily as needed (for pain).   Fluticasone Furoate 100 MCG/ACT Aepb Commonly known as:  ARNUITY ELLIPTA Inhale 1 puff into the lungs daily.

## 2017-08-11 NOTE — Patient Instructions (Signed)
Will arrange for home nebulizer Albuterol one vial nebulized every 4 to 6 hours as needed for cough, wheeze, or chest congestion  Follow up in 6 months

## 2017-08-31 ENCOUNTER — Telehealth: Payer: Self-pay | Admitting: Pulmonary Disease

## 2017-08-31 MED ORDER — ALBUTEROL SULFATE (2.5 MG/3ML) 0.083% IN NEBU: 2.5000 mg | INHALATION_SOLUTION | Freq: Four times a day (QID) | RESPIRATORY_TRACT | 5 refills | Status: DC | PRN

## 2017-08-31 NOTE — Telephone Encounter (Signed)
Spoke with pt. She needs a new prescription for Albuterol neb solution. Rx has been sent in. Nothing further was needed.

## 2017-09-27 ENCOUNTER — Other Ambulatory Visit: Payer: Self-pay | Admitting: Pulmonary Disease

## 2018-01-17 ENCOUNTER — Ambulatory Visit: Payer: 59 | Admitting: Pulmonary Disease

## 2018-02-07 ENCOUNTER — Encounter: Payer: Self-pay | Admitting: Pulmonary Disease

## 2018-02-07 ENCOUNTER — Ambulatory Visit (INDEPENDENT_AMBULATORY_CARE_PROVIDER_SITE_OTHER): Payer: 59 | Admitting: Pulmonary Disease

## 2018-02-07 VITALS — BP 116/78 | HR 63 | Ht 65.0 in | Wt 191.0 lb

## 2018-02-07 DIAGNOSIS — J479 Bronchiectasis, uncomplicated: Secondary | ICD-10-CM

## 2018-02-07 DIAGNOSIS — D869 Sarcoidosis, unspecified: Secondary | ICD-10-CM

## 2018-02-07 MED ORDER — ALBUTEROL SULFATE HFA 108 (90 BASE) MCG/ACT IN AERS: 2.0000 | INHALATION_SPRAY | Freq: Four times a day (QID) | RESPIRATORY_TRACT | 5 refills | Status: DC | PRN

## 2018-02-07 NOTE — Progress Notes (Signed)
[facility]  Chief Complaint  Patient presents with  . Follow-up    6 month follow up. Patient states that her breathing has been about the same since her last visit.     Constitutional:  BP 116/78   Pulse 63   Ht 5' 5"  (1.651 m)   Wt 191 lb (86.6 kg)   SpO2 94%   BMI 31.78 kg/m   Past Medical History:  Sarcoidosis  Brief Summary:  Hailey Ross is a 47 y.o. female with pulmonary sarcoidosis and bronchiectasis.  She still has cough with clear to yellow sputum.  Doesn't feel like arnuity helps.  Not having fever, wheeze, chest pain, sinus congestion, sore throat, hemoptysis, skin rash, gland swelling, or leg swelling.  Gets soreness in her knees and hips > better with a prn aleve.  Has used mucinex and this helps.  Has albuterol nebulizer and this helps some also.  Physical Exam:   Appearance - well kempt   ENMT - clear nasal mucosa, midline nasal  septum, no oral exudates, no LAN, trachea midline  Respiratory - normal chest wall, normal respiratory effort, no accessory muscle use, no wheeze/rales  CV - s1s2 regular rate and rhythm, no murmurs, no peripheral edema, radial pulses symmetric  GI - soft, non tender, no masses  Lymph - no adenopathy noted in neck and axillary areas  MSK - normal gait  Ext - no cyanosis, clubbing, or joint inflammation noted  Skin - no rashes, lesions, or ulcers  Neuro - normal strength, oriented x 3  Psych - normal mood and affect   Assessment/Plan:   Pulmonary sarcoidosis with bronchiectasis. - has persistent productive cough - continue mucinex  - will repeat HRCT chest - prn albuterol - she has nebulizer machine also - discussed option of flutter valve > she doesn't think she wants this at present - discussed option of flu shot > she declined this at present   Patient Instructions  Stop arnuity  Proair two puffs every 6 hours as needed for cough, wheeze, or chest  congestion  Will schedule high resolution CT chest  Follow up in 6 months   Time spent 26 minutes  Chesley Mires, MD [place] Pager: [phone] 02/07/2018, 5:10 PM  Flow Sheet     Pulmonary tests:  CT chest 03/18/15 >> biapical thickening with traction BTX, BTX RML and lingula, subcarinal LAN 1.7 cm, patchy increased interstitial markings Labs 04/21/15 >> HIV negative, Quantiferon gold negative, ACE 195, ANA, RF negative, ESR 32 PFT 05/26/15 >> FEV1 1.67 (66%), FEV1% 88, TLC 2/78 (53%), DLCO 36%   Medications:   Allergies as of 02/07/2018   No Known Allergies     Medication List        Accurate as of 02/07/18  5:10 PM. Always use your most recent med list.          albuterol 108 (90 Base) MCG/ACT inhaler Commonly known as:  PROVENTIL HFA;VENTOLIN HFA Inhale 2 puffs into the lungs every 6 (six) hours as needed for wheezing or shortness of breath.   ALEVE 220 MG tablet Generic drug:  naproxen sodium Take 220-440 mg by mouth daily as needed (for pain).       Past Surgical History:  She  has a past surgical history that includes Tubal ligation (02/1993) and Cholecystectomy (N/A, 08/25/2016).  Family History:  Her family history includes Hypertension in her mother.  Social History:  She  reports that she quit smoking  about 2 years ago. Her smoking use included cigarettes. She has a 20.00 pack-year smoking history. She has never used smokeless tobacco. She reports that she drinks alcohol. She reports that she does not use drugs.

## 2018-02-07 NOTE — Patient Instructions (Signed)
Stop arnuity  Proair two puffs every 6 hours as needed for cough, wheeze, or chest congestion  Will schedule high resolution CT chest  Follow up in 6 months

## 2018-02-27 ENCOUNTER — Ambulatory Visit (HOSPITAL_COMMUNITY): Payer: 59

## 2018-02-28 ENCOUNTER — Telehealth: Payer: Self-pay | Admitting: Pulmonary Disease

## 2018-02-28 NOTE — Telephone Encounter (Signed)
-----   Message from Tobe SosSally E Ottinger sent at 02/14/2018  8:40 AM EST ----- I have sent you the paperwork to do a peer to peer on this pt's chest ct [phone] opt#[id number] thanks

## 2018-02-28 NOTE — Telephone Encounter (Signed)
Peer to peer completed.  Authorization number for CT chest: [id number]  Expiration date: 04/14/2018   [facility].

## 2018-03-06 ENCOUNTER — Ambulatory Visit (HOSPITAL_COMMUNITY)
Admission: RE | Admit: 2018-03-06 | Discharge: 2018-03-06 | Disposition: A | Discharge status: Home / Self Care | Payer: 59 | Source: Ambulatory Visit | Attending: Pulmonary Disease | Admitting: Pulmonary Disease

## 2018-03-06 DIAGNOSIS — D869 Sarcoidosis, unspecified: Secondary | ICD-10-CM | POA: Diagnosis not present

## 2018-03-28 ENCOUNTER — Telehealth: Payer: Self-pay | Admitting: Pulmonary Disease

## 2018-03-28 NOTE — Telephone Encounter (Signed)
HRCT chest 03/07/18 >> borderline LAN, patchy GGO, septal thickening, architectural distortion, cylindrical and varicose BTX    Please let her know that changes of sarcoid on CT chest have progressed compared to scan from 2016.  Please schedule her for ROV to discuss in more details and discuss treatment options.

## 2018-03-29 NOTE — Telephone Encounter (Signed)
Called and spoke with patient regarding results.  Informed the patient of results and recommendations today. Scheduled ROV with EW at 04/02/18 at 3pm Pt verbalized understanding and denied any questions or concerns at this time.  Nothing further needed.

## 2018-03-30 ENCOUNTER — Ambulatory Visit (INDEPENDENT_AMBULATORY_CARE_PROVIDER_SITE_OTHER): Payer: 59 | Admitting: Primary Care

## 2018-03-30 ENCOUNTER — Encounter: Payer: Self-pay | Admitting: Primary Care

## 2018-03-30 DIAGNOSIS — D869 Sarcoidosis, unspecified: Secondary | ICD-10-CM | POA: Diagnosis not present

## 2018-03-30 MED ORDER — PREDNISONE 20 MG PO TABS: ORAL_TABLET | ORAL | 0 refills | Status: DC

## 2018-03-30 MED ORDER — FLUTTER DEVI: 0 refills | Status: DC

## 2018-03-30 NOTE — Assessment & Plan Note (Signed)
-   HRCT showed some progression of pulmonary sarcoidosis  - Needs extended prednisone taper (Prednisone 40mg  x 2 weeks; 30mg  x 2 weeks; 20mg  x 2 weeks) - FU in 5-6 weeks

## 2018-03-30 NOTE — Patient Instructions (Addendum)
HRCT showed some progression of pulmonary sarcoidosis  RX:  Prednisone 40mg  x 2 weeks; 30mg  x 2 weeks; 20mg  x 2 weeks   Recommendations:  Flutter valve 3-4 times a day for congestion  Mucinex twice a day  Delsym cough syrup 5-3010ml every 12 hours as needed for cough   Follow-up: 5-6 weeks with Dr. Craige CottaSood or Waynetta SandyBeth NP

## 2018-03-30 NOTE — Progress Notes (Signed)
 @Patient  ID: Hailey FernsKimberly R Daniels, female    DOB: 12/13/1970, 47 y.o.   MRN: [id number]  Chief Complaint  Patient presents with  . Follow-up    Going over HRCT coughing is worse at this time.    Referring provider: Deon Pillingixon, Mary B, PA-C  HPI: 47 year old female, former smoker quit 2016 (20 pack year hx). PMH significant for sarcoidosis. Patient of Dr. Craige CottaSood, last seen 02/07/18. Repeat HRCT on 03/07/18 showed progression of pulmonary sarcoidosis.   03/30/2018 Patient presents today for follow-up visit to review recent HRCT results. Complains of cough with thick mucus production. Gets short of breath with long walks or climbings stairs. States that after eating a meal if she physically exerts herself she get a lot of mucus up and regurgitated her food at times.  Denies heart burn or belching symptoms.   No Known Allergies  Immunization History  Administered Date(s) Administered  . Pneumococcal Polysaccharide-23 02/12/2014  . Tdap 02/12/2014    Past Medical History:  Diagnosis Date  . Sarcoidosis     Tobacco History: Social History   Tobacco Use  Smoking Status Former Smoker  . Packs/day: 1.00  . Years: 20.00  . Pack years: 20.00  . Types: Cigarettes  . Last attempt to quit: 03/21/2015  . Years since quitting: 3.0  Smokeless Tobacco Never Used   Counseling given: Not Answered   Outpatient Medications Prior to Visit  Medication Sig Dispense Refill  . albuterol (PROAIR HFA) 108 (90 Base) MCG/ACT inhaler Inhale 2 puffs into the lungs every 6 (six) hours as needed for wheezing or shortness of breath. 1 Inhaler 5  . naproxen sodium (ALEVE) 220 MG tablet Take 220-440 mg by mouth daily as needed (for pain).     No facility-administered medications prior to visit.     Review of Systems  Review of Systems  Constitutional: Negative.   HENT: Negative.   Respiratory: Positive for cough and shortness of breath. Negative for apnea, choking, chest tightness and wheezing.     Cardiovascular: Negative.     Physical Exam  BP 112/68 (BP Location: Left Arm, Patient Position: Sitting, Cuff Size: Normal)   Pulse 86   Ht 5\' 5"  (1.651 m)   Wt 190 lb 3.2 oz (86.3 kg)   SpO2 96%   BMI 31.65 kg/m  Physical Exam Constitutional:      Appearance: She is well-developed.  HENT:     Head: Normocephalic and atraumatic.  Eyes:     Pupils: Pupils are equal, round, and reactive to light.  Neck:     Musculoskeletal: Normal range of motion and neck supple.  Cardiovascular:     Rate and Rhythm: Normal rate and regular rhythm.     Heart sounds: Normal heart sounds. No murmur.  Pulmonary:     Effort: Pulmonary effort is normal. No respiratory distress.     Breath sounds: Normal breath sounds. No wheezing.  Abdominal:     General: Bowel sounds are normal.     Palpations: Abdomen is soft.     Tenderness: There is no abdominal tenderness.  Skin:    General: Skin is warm and dry.     Findings: No erythema or rash.  Neurological:     Mental Status: She is alert and oriented to person, place, and time.  Psychiatric:        Behavior: Behavior normal.        Judgment: Judgment normal.      Lab Results:  CBC  Component Value Date/Time   WBC 11.7 (H) 06/12/2017 1007   RBC 4.76 06/12/2017 1007   HGB 13.0 06/12/2017 1007   HCT 39.1 06/12/2017 1007   PLT 474.0 (H) 06/12/2017 1007   MCV 82.1 06/12/2017 1007   MCH 26.6 08/25/2016 0455   MCHC 33.3 06/12/2017 1007   RDW 13.6 06/12/2017 1007   LYMPHSABS 2.6 06/12/2017 1007   MONOABS 0.9 06/12/2017 1007   EOSABS 0.3 06/12/2017 1007   BASOSABS 0.1 06/12/2017 1007    BMET    Component Value Date/Time   NA 134 (L) 08/25/2016 0455   K 3.5 08/25/2016 0455   CL 102 08/25/2016 0455   CO2 23 08/25/2016 0455   GLUCOSE 132 (H) 08/25/2016 0455   BUN 9 08/25/2016 0455   CREATININE 1.12 (H) 08/25/2016 0455   CREATININE 1.17 (H) 02/17/2014 0831   CALCIUM 8.1 (L) 08/25/2016 0455   GFRNONAA 58 (L) 08/25/2016 0455    GFRNONAA 57 (L) 02/17/2014 0831   GFRAA >60 08/25/2016 0455   GFRAA 66 02/17/2014 0831    BNP No results found for: BNP  ProBNP No results found for: PROBNP  Imaging: Ct Chest High Resolution  Result Date: 03/07/2018 CLINICAL DATA:  47 year old female with clinical suspicion for interstitial lung disease or sarcoidosis. Increasing shortness of breath with exertion. Coughing. Intermittent mid chest pain for the past 2 months. EXAM: CT CHEST WITHOUT CONTRAST TECHNIQUE: Multidetector CT imaging of the chest was performed following the standard protocol without intravenous contrast. High resolution imaging of the lungs, as well as inspiratory and expiratory imaging, was performed. COMPARISON:  Chest CT 03/18/2015. FINDINGS: Cardiovascular: Heart size is normal. There is no significant pericardial fluid, thickening or pericardial calcification. Aortic atherosclerosis. No definite coronary artery calcifications are identified. Dilatation of the pulmonic trunk (4.0 cm in diameter). Mediastinum/Nodes: Multiple prominent borderline enlarged and mildly enlarged mediastinal and bilateral hilar lymph nodes are noted. Largest of these measures up to 1.6 cm in the low left paratracheal nodal station. Esophagus is unremarkable in appearance. No axillary lymphadenopathy. Lungs/Pleura: High-resolution images demonstrate patchy areas of ground-glass attenuation, septal thickening, thickening of the peribronchovascular interstitium with regional areas of architectural distortion, include extensive areas of cylindrical and varicose bronchiectasis. These findings have progressed compared to prior study from 2016. Inspiratory and expiratory imaging is unremarkable. No acute consolidative airspace disease. No pleural effusions. No suspicious appearing pulmonary nodules or masses are noted. Upper Abdomen: Status post cholecystectomy. Musculoskeletal: There are no aggressive appearing lytic or blastic lesions noted in the  visualized portions of the skeleton. IMPRESSION: 1. Although there is evidence of fibrosis in the lungs (and mediastinal/hilar lymphadenopathy), the pattern is nonspecific. If there is known history of sarcoidosis, this is presumed to be reflect progressive disease. This could alternatively reflect interstitial lung disease such as chronic hypersensitivity pneumonitis, however, that is not strongly favored. Findings do not appear compatible with usual interstitial pneumonia (UIP). 2. Dilatation of the pulmonic trunk (4.0 cm in diameter), suggestive of associated pulmonary arterial hypertension. 3. Aortic atherosclerosis. Aortic Atherosclerosis (ICD10-I70.0). Electronically Signed   By: Trudie Reed M.D.   On: 03/07/2018 10:02     Assessment & Plan:   Sarcoidosis (HCC) - HRCT showed some progression of pulmonary sarcoidosis  - Needs extended prednisone taper (Prednisone 40mg  x 2 weeks; 30mg  x 2 weeks; 20mg  x 2 weeks) - FU in 5-6 weeks    Bronchiectasis (HCC) - Needs daily pulmonary toileting  - Recommend Flutter valve 3-4 times a day for congestion and Mucinex twice  a day  - Delsym cough syrup 5-4810ml every 12 hours as needed for cough  - ?GERD contributing to cough/regurgiation, if above not effective would recommend adding PPI    Glenford BayleyElizabeth W Drae Mitzel, NP 03/30/2018

## 2018-03-30 NOTE — Assessment & Plan Note (Addendum)
-   Needs daily pulmonary toileting  - Recommend Flutter valve 3-4 times a day for congestion and Mucinex twice a day  - Delsym cough syrup 5-5910ml every 12 hours as needed for cough  - ?GERD contributing to cough/regurgiation, if above not effective would recommend adding PPI

## 2018-03-30 NOTE — Progress Notes (Signed)
Reviewed and agree with assessment/plan.   Demitra Danley, MD [facility] 04/06/2016, 12:24 PM Pager:  [phone]  

## 2018-05-04 ENCOUNTER — Ambulatory Visit (INDEPENDENT_AMBULATORY_CARE_PROVIDER_SITE_OTHER): Payer: 59 | Admitting: Pulmonary Disease

## 2018-05-04 ENCOUNTER — Encounter: Payer: Self-pay | Admitting: Pulmonary Disease

## 2018-05-04 VITALS — BP 118/76 | HR 70 | Ht 65.0 in | Wt 191.8 lb

## 2018-05-04 DIAGNOSIS — J479 Bronchiectasis, uncomplicated: Secondary | ICD-10-CM | POA: Diagnosis not present

## 2018-05-04 DIAGNOSIS — D869 Sarcoidosis, unspecified: Secondary | ICD-10-CM | POA: Diagnosis not present

## 2018-05-04 MED ORDER — PREDNISONE 5 MG PO TABS: ORAL_TABLET | ORAL | 0 refills | Status: DC

## 2018-05-04 NOTE — Patient Instructions (Signed)
Prednisone 5 mg pill >> 3 pills daily for 2 weeks, then 2 pills daily for 2 weeks, then 1 pill daily until next visit  Follow up in 8 weeks

## 2018-05-04 NOTE — Progress Notes (Signed)
[facility]  Chief Complaint  Patient presents with  . Follow-up    Pt has productive cough-green/yellow; she states she is better since last ov. Seen an improvement in her cough.     Constitutional:  BP 118/76 (BP Location: Left Arm, Cuff Size: Normal)   Pulse 70   Ht _0  (1.651 m)   Wt 191 lb 12.8 oz (87 kg)   SpO2 96%   BMI 31.92 kg/m   Past Medical History:  Sarcoidosis  Brief Summary:  Hailey Ross is a 48 y.o. female with pulmonary sarcoidosis and bronchiectasis.  She had CT chest in November (reviewed by me).  Progression of sarcoid.  Started on prednisone.  Now on 20 mg daily.  Still has cough with clear to green sputum, but much better.  Breathing better and able to do more activity.  Not having sinus congestion, sore throat, chest pain, fever, skin rash, abdominal pain, hemoptysis, or leg swelling  Physical Exam:   Appearance - well kempt   ENMT - no sinus tenderness, no nasal discharge, no oral exudate, wears dentures  Neck - no masses, trachea midline, no thyromegaly, no elevation in JVP  Respiratory - normal appearance of chest wall, normal respiratory effort w/o accessory muscle use, no dullness on percussion, no tactile fremitus, no wheezing or rales  CV - s1s2 regular rate and rhythm, no murmurs, no peripheral edema, radial pulses symmetric  GI - soft, non tender  Lymph - no adenopathy noted in neck and axillary areas  MSK - normal gait  Ext - no cyanosis, clubbing, or joint inflammation noted  Skin - no rashes, lesions, or ulcers  Neuro - normal strength, oriented x 3  Psych - normal mood and affect   Assessment/Plan:   Pulmonary sarcoidosis with bronchiectasis. - gradually taper to prednisone 5 mg daily - continue bronchial hygiene - prn albuterol - discussed that she might need steroid sparing agent if we can't taper prednisone effectively   Patient Instructions  Prednisone 5 mg  pill >> 3 pills daily for 2 weeks, then 2 pills daily for 2 weeks, then 1 pill daily until next visit  Follow up in 8 weeks   Time spent 26 minutes  Chesley Mires, MD [facility] Pager: [phone] 05/04/2018, 3:15 PM  Flow Sheet     Pulmonary tests:  CT chest 03/18/15 >> biapical thickening with traction BTX, BTX RML and lingula, subcarinal LAN 1.7 cm, patchy increased interstitial markings Labs 04/21/15 >> HIV negative, Quantiferon gold negative, ACE 195, ANA, RF negative, ESR 32 PFT 05/26/15 >> FEV1 1.67 (66%), FEV1% 88, TLC 2/78 (53%), DLCO 36% HRCT chest 03/07/18 >> borderline LAN, patchy GGO, septal thickening, architectural distortion, cylindrical and varicose BTX  Medications:   Allergies as of 05/04/2018   No Known Allergies     Medication List       Accurate as of May 04, 2018  3:15 PM. Always use your most recent med list.        albuterol 108 (90 Base) MCG/ACT inhaler Commonly known as:  PROAIR HFA Inhale 2 puffs into the lungs every 6 (six) hours as needed for wheezing or shortness of breath.   ALEVE 220 MG tablet Generic drug:  naproxen sodium Take 220-440 mg by mouth daily as needed (for pain).   FLUTTER Devi Use flutter valve 3 times a day   predniSONE 5 MG tablet Commonly known as:  DELTASONE 3 pills daily for 2 weeks, 2  pills daily for 2 weeks, then 1 pill daily       Past Surgical History:  She  has a past surgical history that includes Tubal ligation (02/1993) and Cholecystectomy (N/A, 08/25/2016).  Family History:  Her family history includes Hypertension in her mother.  Social History:  She  reports that she quit smoking about 3 years ago. Her smoking use included cigarettes. She has a 20.00 pack-year smoking history. She has never used smokeless tobacco. She reports current alcohol use. She reports that she does not use drugs.

## 2018-05-08 ENCOUNTER — Telehealth: Payer: Self-pay | Admitting: Pulmonary Disease

## 2018-05-08 NOTE — Telephone Encounter (Signed)
Per Harvin HazelKelli, corrected form is at front desk for pt to pick up. Pt called and made aware of new paperwork that is ready for pick up.

## 2018-06-26 ENCOUNTER — Telehealth: Payer: Self-pay | Admitting: Pulmonary Disease

## 2018-06-26 NOTE — Telephone Encounter (Signed)
Called patient today to reschedule appt on 07/02/2018 to 08/06/2018 at 3pm with VS Pt was last seen on 05/07/2018  Patient Instructions  Prednisone 5 mg pill >> 3 pills daily for 2 weeks, then 2 pills daily for 2 weeks, then 1 pill daily until next visit Follow up in 8 weeks  Patient will be out of prednisone by the new 08/06/2018 appt. Pt would like to know does she stop the prednisone or need another rx  VS please advise. Thank you

## 2018-06-28 ENCOUNTER — Telehealth: Payer: Self-pay | Admitting: Pulmonary Disease

## 2018-06-28 NOTE — Telephone Encounter (Signed)
VS spoke with patient today her symptoms are stable at this time Would like clarity on the rx info listed below Prednisone 4 mg daily for 2 weeks, 3 mg daily for 2 weeks, 2 mg daily until next ROV on 08/06/18.  VS do you mean 5mg  tabs take 4 tabs daily for 2 weeks, 3 tabs daily for 2 weeks, 2 tabs daily until ROB 08/06/18?  Please advise, thank you.

## 2018-06-28 NOTE — Telephone Encounter (Signed)
Pt is calling back [phone]

## 2018-06-28 NOTE — Telephone Encounter (Signed)
Called patient, unable to reach and unable to leave VM as phone kept ringing busy. Will call back.

## 2018-06-28 NOTE — Telephone Encounter (Signed)
Called patient unable to reach and unable to leave VM 

## 2018-06-28 NOTE — Telephone Encounter (Signed)
If her symptoms are stable, then change her prednisone to the following:  Prednisone 4 mg daily for 2 weeks, 3 mg daily for 2 weeks, 2 mg daily until next ROV on 08/06/18.

## 2018-06-29 MED ORDER — PREDNISONE 1 MG PO TABS: ORAL_TABLET | ORAL | 0 refills | Status: DC

## 2018-06-29 NOTE — Telephone Encounter (Signed)
She needs new prescription for prednisone 1 (one) mg pill.  She then takes 4 pills (so 4 mg) daily for 2 weeks, 3 pills (3 mg) daily for 2 weeks, and then stays on 2 pills (2 mg) daily until next ROV.  This is a slower taper process for her to try and come off prednisone.

## 2018-06-29 NOTE — Telephone Encounter (Signed)
Sending script to pt's pharmacy today for prednisone as requested by VS She then takes 4 pills (so 4 mg) daily for 2 weeks, 3 pills (3 mg) daily for 2 weeks, and then stays on 2 pills (2 mg) daily until next ROV.  This is a slower taper process for her to try and come off prednisone. appt made for patient for 08/06/2018 Nothing further needed.

## 2018-06-29 NOTE — Telephone Encounter (Signed)
Called and spoke with pt in regards to her wanting to know if VS thought she should still be working or not and pt stated to me her job just sent her home today and stated to her that she does not need a doctor's note. Stated to pt to call us if she needed anything and pt verbalized understanding. Nothing further needed.

## 2018-07-02 ENCOUNTER — Ambulatory Visit: Payer: 59 | Admitting: Pulmonary Disease

## 2018-07-21 ENCOUNTER — Other Ambulatory Visit: Payer: Self-pay | Admitting: Pulmonary Disease

## 2018-08-06 ENCOUNTER — Telehealth: Payer: Self-pay | Admitting: Pulmonary Disease

## 2018-08-06 ENCOUNTER — Ambulatory Visit: Payer: 59 | Admitting: Pulmonary Disease

## 2018-08-06 NOTE — Telephone Encounter (Signed)
That is fine. Please ask her if ok to state pulmonary medical condition in letter? If not please omit.   Patient is high risk for potential complications from COVID-19 if contracted due to history of sarcoidosis and bronchiectasis. It is our recommended that patient work from home if at all possible until COVID restrictions are lifted. Please discuss with employee and/or human resources if needed. Any further questions please contact [facility]

## 2018-08-06 NOTE — Telephone Encounter (Signed)
Called and spoke with pt who stated she spoke with [facility] about her and working. Per pt, pt has been out of work x2 weeks and went back to work last Monday, 4/20 but then the office was closed 4/21 x2 days due to suspected COVID case so they could be able to properly sanitize the office.  Pt stated she was supposed to go back to work today, 4/27 but pt did not go back to work. Pt received information that a meeting took place at work and based on the meeting, pt was told that there was a confirmed case of COVID at her job.  Due to this, pt is not comfortable returning back to work and she is wanting to know if there is any way she could receive a letter for work to keep her out of work until restrictions are lifted for [employer]. Pt states that she does wear a mask at work but states she is unable to keep it on at all times due to it making it difficult for her to breathe and this is another reason why pt is requesting a letter for work.  Beth, please advise if you are okay with [place] writing a letter for pt to keep her out of work until COVID restrictions are lifted and if you are, please advise what all you want [place] to state in letter for pt. Thanks!

## 2018-08-06 NOTE — Telephone Encounter (Signed)
Called and spoke with pt letting her know that we were going to write her a letter for her employer. Stated to pt as soon as letter is written, she should be able to see it in her mychart account and be able to print it off for her employer. Pt expressed understanding. Letter has been written for pt. Nothing further needed.

## 2018-09-21 ENCOUNTER — Telehealth: Payer: Self-pay | Admitting: Primary Care

## 2018-09-21 NOTE — Telephone Encounter (Signed)
yes

## 2018-09-21 NOTE — Telephone Encounter (Signed)
In the letter that was written for pt on 08/06/2018, we stated in there for pt to work from home if at all possible until June 15,2020 or until [place] restrictions were lifted.  Attempted to call pt but unable to reach. Left message for pt to return call.

## 2018-09-21 NOTE — Telephone Encounter (Signed)
Pt returned call. I stated to pt the information from the last note we had sent to her employer 4/27 stating that we had specified in there for her to work from home until 6/15 or until COVID restrictions were lifted.  Pt stated she had just spoken to her employer and stated they were needing a letter to be written stating that she has been cleared to be okay to return back to work. Pt said that they will be wearing masks and are taking temps daily.  Pt said that [place] is needing to know if pt has any restrictions for when she does return back to work.  Beth, please advise if you are fine with the letter being written to clear pt back to work 6/15. Also, please let us know if there are any restrictions that pt may have. Thanks!

## 2018-09-24 NOTE — Telephone Encounter (Signed)
Letter has been signed and printed.   Spoke with patient. She stated that she wishes to have the letter to be sent to her. Verified her address. Will place in the mail today.

## 2018-10-10 ENCOUNTER — Encounter: Payer: Self-pay | Admitting: Family Medicine

## 2018-10-10 ENCOUNTER — Other Ambulatory Visit: Payer: Self-pay

## 2018-10-10 ENCOUNTER — Ambulatory Visit (INDEPENDENT_AMBULATORY_CARE_PROVIDER_SITE_OTHER): Payer: 59 | Admitting: Family Medicine

## 2018-10-10 VITALS — BP 120/78 | HR 57 | Temp 98.5°F | Resp 12 | Ht 65.0 in | Wt 203.1 lb

## 2018-10-10 DIAGNOSIS — Z6833 Body mass index (BMI) 33.0-33.9, adult: Secondary | ICD-10-CM | POA: Diagnosis not present

## 2018-10-10 DIAGNOSIS — D860 Sarcoidosis of lung: Secondary | ICD-10-CM

## 2018-10-10 DIAGNOSIS — E6609 Other obesity due to excess calories: Secondary | ICD-10-CM | POA: Diagnosis not present

## 2018-10-10 NOTE — Patient Instructions (Signed)
    Thank you for coming into the office today. I appreciate the opportunity to provide you with the care for your health and wellness. Today we discussed: overall health  Follow Up: 6 months (Jan) annual without pap.  Fasting labs 1 week before appt  Work on water intake.  Continuing walking and eating well balanced diet.  Please continue to practice social distancing to keep you, your family, and our community safe.  If you must go out, please wear a Mask and practice good handwashing.  [place] YOUR HANDS WELL AND FREQUENTLY. AVOID TOUCHING YOUR FACE, UNLESS YOUR HANDS ARE FRESHLY WASHED.  GET FRESH AIR DAILY. STAY HYDRATED WITH WATER.   It was a pleasure to see you and I look forward to continuing to work together on your health and well-being. Please do not hesitate to call the office if you need care or have questions about your care.  Have a wonderful day and week. With Gratitude, Cherly Beach, DNP, AGNP-BC

## 2018-10-10 NOTE — Progress Notes (Signed)
Subjective:     Patient ID: Hailey Ross, female   DOB: [birth year], 48 y.o.   MRN: [id number]  Hailey Ross presents for New Patient (Initial Visit) (establish care) Hailey Ross is a 48 year old female patient who presents today to establish care.  She reports she is only being seen by pulmonologist secondary to having sarcoidosis diagnosed in 2017.  Dr. Craige CottaSood with of our pulmonology as her provider.  She uses a flutter valve and has pro-air as needed.  Occasionally will need prednisone but overall she does really well and headache controlled.  Health maintenance: Reports that she does see an eye doctor regularly.  Reports she does see her dentist twice a year.  Reports that she had her last mammogram in 2019 needs to schedule one for this year.  Reports that she had her last Pap in 2020.  She has no noted care gaps in her chart appears to be up-to-date on all her vaccines and screenings.  Additionally she has been tested for HIV which was negative.  Is unsure if she has had hepatitis C.  She has had her pneumonia vaccines as well.  Outside of sarcoidosis she is a former smoker which stopped in 2016.  Has had a tubular ligation and gallbladder removed.  She has a family history of hypertension in her mother but everyone else appears to be healthy.  Reports that she drinks occasionally but it is rare and not weekly.  Denies any illegal drug or prescription drug that is not hers use.  Reports she is sexually active does not use birth control as she has had surgical tibial ligation.  Reports living with her partner Casimiro NeedleMichael at 19 years.  3 children.  Her daughter who is [id number] lives at home with her with-2 grandchildren as well.  She has a son who is expecting.  And another son who does not have any children.  Children live relatively close by and she can communicate with them often.  She enjoys reading, playing games on her tablet, enjoying spring time with her grandkids.  She reports her diet  is heavy and veggies does not eat a lot of fried foods.  But she does enjoy chicken and fruit.  She might eat not as great as she could she reports but overall eats well.  She does report that she does drink a good amount of soda and coffee usually has sugar in her coffee.  That might be where she gets a lot of extra calorie and sugar intake.  She reports she does not drink water well at all.  She reports that she does wear seatbelt does not wear sunscreen though.  Does have smoke and carbon monoxide detectors at home.  Does not use the phone while driving per her.  Today patient denies signs and symptoms of COVID 19 infection including fever, chills, cough, shortness of breath, and headache.  Past Medical, Surgical, Social History, Allergies, and Medications have been Reviewed.   Past Medical History:  Diagnosis Date  . Sarcoidosis    Past Surgical History:  Procedure Laterality Date  . CHOLECYSTECTOMY N/A 08/25/2016   Procedure: LAPAROSCOPIC CHOLECYSTECTOMY;  Surgeon: Abigail MiyamotoBlackman, Douglas, MD;  [facility];  Service: General;  Laterality: N/A;  . TUBAL LIGATION  02/1993   Social History   Socioeconomic History  . Marital status: Single    Spouse name: Not on file  . Number of children: Not on file  . Years of education: Not on file  .  Highest education level: Not on file  Occupational History  . 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 Financial resource strain: Not on file  . Food insecurity    Worry: Not on file    Inability: Not on file  . Transportation needs    Medical: Not on file    Non-medical: Not on file  Tobacco Use  . Smoking status: Former Smoker    Packs/day: 1.00    Years: 20.00    Pack years: 20.00    Types: Cigarettes    Quit date: 03/21/2015    Years since quitting: 3.5  . Smokeless tobacco: Never Used  Substance and Sexual Activity  . Alcohol use: Yes    Alcohol/week: 0.0 standard drinks    Comment: Rare  . Drug use: No  . Sexual  activity: Not Currently  Lifestyle  . Physical activity    Days per week: Not on file    Minutes per session: Not on file  . Stress: Not on file  Relationships  . Social Musicianconnections    Talks on phone: Not on file    Gets together: Not on file    Attends religious service: Not on file    Active member of club or organization: Not on file    Attends meetings of clubs or organizations: Not on file    Relationship status: Not on file  . Intimate partner violence    Fear of current or ex partner: Not on file    Emotionally abused: Not on file    Physically abused: Not on file    Forced sexual activity: Not on file  Other Topics Concern  . Not on file  Social History Narrative   3 children. 2 are at home. 1 is at college.    At Home: It is her and her 2 children.   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    Outpatient Encounter Medications as of 10/10/2018  Medication Sig  . albuterol (PROAIR HFA) 108 (90 Base) MCG/ACT inhaler Inhale 2 puffs into the lungs every 6 (six) hours as needed for wheezing or shortness of breath.  . naproxen sodium (ALEVE) 220 MG tablet Take 220-440 mg by mouth daily as needed (for pain).  Marland Kitchen. Respiratory Therapy Supplies (FLUTTER) DEVI Use flutter valve 3 times a day  . [DISCONTINUED] predniSONE (DELTASONE) 1 MG tablet Take 4 pills (4 mg) daily for 2 weeks, 3 pills (3 mg) daily for 2 weeks, and then 2 pills (2 mg) daily until next visit 08/06/18. (Patient not taking: Reported on 10/10/2018)  . [DISCONTINUED] predniSONE (DELTASONE) 5 MG tablet 3 pills daily for 2 weeks, 2 pills daily for 2 weeks, then 1 pill daily (Patient not taking: Reported on 10/10/2018)   No facility-administered encounter medications on file as of 10/10/2018.    No Known Allergies  Review of Systems  Constitutional: Negative for activity change, appetite change, chills and fever.  HENT: Negative.   Eyes: Negative for visual  disturbance.  Respiratory: Negative for cough and shortness of breath.   Cardiovascular: Negative for chest pain, palpitations and leg swelling.  Gastrointestinal: Negative.   Endocrine: Negative for polydipsia, polyphagia and polyuria.  Genitourinary: Negative.   Musculoskeletal: Negative.   Skin: Negative.   Allergic/Immunologic: Negative.   Neurological: Negative for dizziness and headaches.  Hematological: Negative.   Psychiatric/Behavioral: Negative.   All other systems reviewed and are negative.      Objective:  BP 120/78   Pulse (!) 57   Temp 98.5 F (36.9 C) (Temporal)   Resp 12   Ht 5\' 5"  (1.651 m)   Wt 203 lb 1.9 oz (92.1 kg)   SpO2 94%   BMI 33.80 kg/m   Physical Exam Vitals signs and nursing note reviewed.  Constitutional:      Appearance: Normal appearance. She is obese.  HENT:     Head: Normocephalic and atraumatic.     Right Ear: External ear normal.     Left Ear: External ear normal.     Nose: Nose normal.  Eyes:     General:        Right eye: No discharge.        Left eye: No discharge.     Conjunctiva/sclera: Conjunctivae normal.  Neck:     Musculoskeletal: Normal range of motion and neck supple.  Cardiovascular:     Rate and Rhythm: Normal rate and regular rhythm.     Pulses: Normal pulses.     Heart sounds: Normal heart sounds.  Pulmonary:     Effort: Pulmonary effort is normal.     Breath sounds: Normal breath sounds.  Musculoskeletal: Normal range of motion.  Skin:    General: Skin is warm.     Capillary Refill: Capillary refill takes less than 2 seconds.  Neurological:     Mental Status: She is alert and oriented to person, place, and time.  Psychiatric:        Mood and Affect: Mood normal.        Behavior: Behavior normal.        Thought Content: Thought content normal.        Judgment: Judgment normal.        Assessment and Plan       1. Class 1 obesity due to excess calories with body mass index (BMI) of 33.0 to 33.9  in adult, unspecified whether serious comorbidity present Encouraged to eat a more well-balanced diet.  As she reports she eats pretty decently as well.  Encouraged her to increase her water intake and reduce her sodium and extra sugar intake.  Additionally avoiding sodas as well.  We will follow-up in 6 months for an annual without a Pap smear.  1 week before we will get fasting labs prior to appointment.  - COMPLETE METABOLIC PANEL WITH GFR - Hemoglobin A1c - Lipid panel - CBC  2. Sarcoidosis of lung ([place]) Controlled, maintain by pulmonologist.  Appreciate collaboration in her care.  Please let me know if anything from PCP.       Follow-up: 6 months, fasting labs 1 week before.  Or as needed  Perlie Mayo, DNP, AGNP-BC [place], [address], [place] [address] Office Hours: Mon-Thurs 8 am-5 pm; Fri 8 am-12 pm Office Phone:  [phone]  Office Fax: [phone]

## 2018-10-18 ENCOUNTER — Encounter: Payer: Self-pay | Admitting: Family Medicine

## 2018-12-27 ENCOUNTER — Telehealth: Payer: Self-pay | Admitting: Family Medicine

## 2018-12-27 NOTE — Telephone Encounter (Signed)
Patients chart has been updated.

## 2018-12-27 NOTE — Telephone Encounter (Signed)
Called pt to schedule a flu shot --and she states that it was completed at work She works at [employer], shot was given on Sept 10th, 2020.

## 2019-03-14 ENCOUNTER — Other Ambulatory Visit: Payer: Self-pay

## 2019-03-14 ENCOUNTER — Encounter: Payer: Self-pay | Admitting: Family Medicine

## 2019-03-14 ENCOUNTER — Ambulatory Visit (INDEPENDENT_AMBULATORY_CARE_PROVIDER_SITE_OTHER): Payer: 59 | Admitting: Family Medicine

## 2019-03-14 VITALS — BP 133/84 | HR 70 | Temp 98.9°F | Resp 15 | Ht 65.0 in | Wt 222.0 lb

## 2019-03-14 DIAGNOSIS — L02411 Cutaneous abscess of right axilla: Secondary | ICD-10-CM

## 2019-03-14 DIAGNOSIS — M25561 Pain in right knee: Secondary | ICD-10-CM | POA: Diagnosis not present

## 2019-03-14 DIAGNOSIS — M21611 Bunion of right foot: Secondary | ICD-10-CM

## 2019-03-14 MED ORDER — SULFAMETHOXAZOLE-TRIMETHOPRIM 800-160 MG PO TABS: 1.0000 | ORAL_TABLET | Freq: Two times a day (BID) | ORAL | 0 refills | Status: AC

## 2019-03-14 NOTE — Patient Instructions (Addendum)
  I appreciate the opportunity to provide you with care for your health and wellness. Today we discussed: knee and foot-pain and under arm infection  Follow up: 04/16/2019  No labs   Referral for podiatry   Start walking at least 5 minutes every hour.  Standing and stretching is really good for your hips and your knees and ankles.  Rotate Tylenol with Aleve so that you do not overdo Aleve to protect kidneys and do not overdo Tylenol to protect liver.  Please take Bactrim in full course.  If arm does not get better or gets worse or still bothersome we will look at referral for surgeon to consult whether or not you need to have this removed surgically.  I hope you have a wonderful, happy, safe, and healthy Holiday Season! See you in the New Year :)  Please continue to practice social distancing to keep you, your family, and our community safe.  If you must go out, please wear a mask and practice good handwashing.  It was a pleasure to see you and I look forward to continuing to work together on your health and well-being. Please do not hesitate to call the office if you need care or have questions about your care.  Have a wonderful day and week. With Gratitude, Cherly Beach, DNP, AGNP-BC

## 2019-03-14 NOTE — Progress Notes (Signed)
Subjective:     Patient ID: Hailey Ross, female   DOB: 01-19-1971, 48 y.o.   MRN: [id number]  [name] presents for Knee Pain, Recurrent Skin Infections, and Foot Pain   Ms. Olena Heckle has a history of sarcoidosis presents today with knee pain, foot pain, recurrent skin swelling boil on the right side.  Knee pain: Reports that started 1 month ago hurts behind the knee and get stiff especially when she is going from a seated to a standing position.  Or bending or squatting.  Reports is a 6 out of 10 pain intermittent in nothing is helped it which is including Aleve, Tylenol.  Additionally reports that she has started sitting a lot more with work and has been less active even at home.  Reports no trauma or injury.  Has not had this issue before.  Reports that she crosses her legs a lot as well.  When sitting.  Recurrent skin infection right boil under arm in the axillary been draining for about a week.  She was pushing and mashing and pulling it to ahead with hot compresses.  Reports she had evacuated 6 years ago at an urgent care.  Reports that she always had a little bit of a knot even after that.  Reports that it has come back a few times but usually would go away.  Reports that it is tender and uncomfortable at this time.  Is unsure what could have caused it whether she shaved or had another injury to the area she is unsure of.  Right foot reports that she has had a fracture in the right foot before.  Reports that she has had no injury or trauma to it.  Reports that she wear shoes or no shoes predominantly when she is at home.  Wears tennis shoes that are comfortable for her not tight.  Appears to have somewhat of a bunion forming at the right great toe.  Has had a fracture before at work in that same metatarsal area is unsure if that might be causing the issue.  Reports that the pain she has is like really quick and lightninglike.  Sometimes the area can be red and warm and tender  to touch.  Further down from the actual great toe-no history of gout.  Today patient denies signs and symptoms of COVID 19 infection including fever, chills, cough, shortness of breath, and headache.  Past Medical, Surgical, Social History, Allergies, and Medications have been Reviewed. Past Medical History:  Diagnosis Date  . Sarcoidosis    Past Surgical History:  Procedure Laterality Date  . CHOLECYSTECTOMY N/A 08/25/2016   Procedure: LAPAROSCOPIC CHOLECYSTECTOMY;  Surgeon: Coralie Keens, MD;  Location: [facility];  Service: General;  Laterality: N/A;  . TUBAL LIGATION  02/1993   Social History   Socioeconomic History  . Marital status: Single    Spouse name: Not on file  . Number of children: 3  . Years of education: Not on file  . Highest education level: High school graduate  Occupational History  . 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 Financial resource strain: Not hard at all  . Food insecurity    Worry: Never true    Inability: Never true  . Transportation needs    Medical: No    Non-medical: No  Tobacco Use  . Smoking status: Former Smoker    Packs/day: 1.00    Years: 20.00    Pack years: 20.00    Types:  Cigarettes    Quit date: 03/21/2015    Years since quitting: 3.9  . Smokeless tobacco: Never Used  Substance and Sexual Activity  . Alcohol use: Yes    Alcohol/week: 0.0 standard drinks    Comment: Rare  . Drug use: No  . Sexual activity: Yes    Birth control/protection: Surgical  Lifestyle  . Physical activity    Days per week: 7 days    Minutes per session: 60 min  . Stress: Not at all  Relationships  . Social connections    Talks on phone: More than three times a week    Gets together: Once a week    Attends religious service: More than 4 times per year    Active member of club or organization: No    Attends meetings of clubs or organizations: Never    Relationship status: Never married  . Intimate partner violence    Fear of  current or ex partner: No    Emotionally abused: No    Physically abused: No    Forced sexual activity: No  Other Topics Concern  . Not on file  Social History Narrative   Live with partners [name]-19 years   3 children.    1-[name] (girl) 27 at home with her: 2 grandchildren in her home as well    [name]- 26 expecting   [name]-25      Enjoy: read, play games on tablet, grandkids      Diet: Veggies, does not eat a lot of fried foods, enjoys chicken and fruit   Caffeine: sodas and coffee (with sugar)   Water: Does not drink a lot at all      Wears seat beat   Does not wear sunscreen   Smoke and carbon monoxide detectors   Does not use phone while driving     Outpatient Encounter Medications as of 03/14/2019  Medication Sig  . albuterol (PROAIR HFA) 108 (90 Base) MCG/ACT inhaler Inhale 2 puffs into the lungs every 6 (six) hours as needed for wheezing or shortness of breath.  . naproxen sodium (ALEVE) 220 MG tablet Take 220-440 mg by mouth daily as needed (for pain).  Marland Kitchen. Respiratory Therapy Supplies (FLUTTER) DEVI Use flutter valve 3 times a day  . sulfamethoxazole-trimethoprim (BACTRIM DS) 800-160 MG tablet Take 1 tablet by mouth 2 (two) times daily for 7 days.   No facility-administered encounter medications on file as of 03/14/2019.    No Known Allergies  Review of Systems  Constitutional: Negative.   HENT: Negative.   Eyes: Negative.   Respiratory: Negative.   Cardiovascular: Negative.   Gastrointestinal: Negative.   Endocrine: Negative.   Genitourinary: Negative.   Musculoskeletal:       See HPI  Skin:       See HPI  Allergic/Immunologic: Negative.   Neurological: Negative.   Hematological: Negative.   Psychiatric/Behavioral: Negative.   All other systems reviewed and are negative.      Objective:     BP 133/84   Pulse 70   Temp 98.9 F (37.2 C) (Oral)   Resp 15   Ht 5\' 5"  (1.651 m)   Wt 222 lb 0.6 oz (100.7 kg)   SpO2 95%   BMI 36.95 kg/m    Physical Exam Vitals signs and nursing note reviewed.  Constitutional:      Appearance: Normal appearance. She is obese.  HENT:     Head: Normocephalic and atraumatic.     Right Ear: External ear normal.  Left Ear: External ear normal.     Nose: Nose normal.     Mouth/Throat:     Pharynx: Oropharynx is clear.  Eyes:     General:        Right eye: No discharge.        Left eye: No discharge.     Conjunctiva/sclera: Conjunctivae normal.  Neck:     Musculoskeletal: Normal range of motion and neck supple.  Cardiovascular:     Rate and Rhythm: Normal rate and regular rhythm.     Pulses: Normal pulses.     Heart sounds: Normal heart sounds.  Pulmonary:     Effort: Pulmonary effort is normal.     Breath sounds: Normal breath sounds.  Musculoskeletal:     Right knee: She exhibits decreased range of motion. She exhibits no swelling.     Right foot: Bunion present.  Feet:     Right foot:     Skin integrity: Skin integrity normal.     Toenail Condition: Right toenails are normal.  Skin:    General: Skin is warm.     Findings: Abscess present.     Comments: Abscess: Right axillary no drainage present 2.5 cm x 1.5 cm  Neurological:     General: No focal deficit present.     Mental Status: She is alert and oriented to person, place, and time.  Psychiatric:        Mood and Affect: Mood normal.        Behavior: Behavior normal.        Thought Content: Thought content normal.        Judgment: Judgment normal.        Assessment and Plan        1. Bunion of great toe of right foot Start of a bunion on great toe right foot in addition she is fractured that joint/metatarsal possible osteoarthritis setting into the joint unsure of which causing her intermittent pain at times sometimes redness and swelling and discomfort.  Referral to podiatry for work-up and further treatment if needed  - Ambulatory referral to Podiatry  2. Abscess of axilla, right S&S of infection secondary to  possible staph/MRSA unsure has had this I&D it in the past several years ago.  Has repeat occurrence over the last several years this time it is just bothering her more and more tender.  Does have a hard knot up underneath the area.  We will be treating with Bactrim.  Possible need for referral to surgery if does not respond to antibiotics  - sulfamethoxazole-trimethoprim (BACTRIM DS) 800-160 MG tablet; Take 1 tablet by mouth 2 (two) times daily for 7 days.  Dispense: 14 tablet; Refill: 0   3. Acute pain of right knee Acute right knee pain predominantly in the back most likely secondary to shortening tendons and ligaments from crossing legs and/or staying in a seated position with legs crossed for extended amount of time.  Encourage walking and stretching if no relief will look for possible need of physical therapy and/or x-rays and/or referral to orthopedics.    Follow up: 04/16/2019  Freddy Finner, DNP, AGNP-BC [facility] [address], [address] [place], [place] [address] Office Hours: Mon-Thurs 8 am-5 pm; Fri 8 am-12 pm Office Phone:  [phone]  Office Fax: [phone]

## 2019-03-25 ENCOUNTER — Other Ambulatory Visit: Payer: Self-pay

## 2019-03-25 ENCOUNTER — Other Ambulatory Visit: Payer: Self-pay | Admitting: Podiatry

## 2019-03-25 ENCOUNTER — Ambulatory Visit (INDEPENDENT_AMBULATORY_CARE_PROVIDER_SITE_OTHER): Payer: 59 | Admitting: Podiatry

## 2019-03-25 ENCOUNTER — Ambulatory Visit (INDEPENDENT_AMBULATORY_CARE_PROVIDER_SITE_OTHER): Payer: 59

## 2019-03-25 DIAGNOSIS — M21611 Bunion of right foot: Secondary | ICD-10-CM

## 2019-03-25 DIAGNOSIS — M79671 Pain in right foot: Secondary | ICD-10-CM

## 2019-03-25 NOTE — Patient Instructions (Signed)
Pre-Operative Instructions  Congratulations, you have decided to take an important step towards improving your quality of life.  You can be assured that the doctors and staff at [facility] will be with you every step of the way.  Here are some important things you should know:  1. Plan to be at the surgery center/hospital at least 1 (one) hour prior to your scheduled time, unless otherwise directed by the surgical center/hospital staff.  You must have a responsible adult accompany you, remain during the surgery and drive you home.  Make sure you have directions to the surgical center/hospital to ensure you arrive on time. 2. If you are having surgery at [facility] or [facility] hospitals, you will need a copy of your medical history and physical form from your family physician within one month prior to the date of surgery. We will give you a form for your primary physician to complete.  3. We make every effort to accommodate the date you request for surgery.  However, there are times where surgery dates or times have to be moved.  We will contact you as soon as possible if a change in schedule is required.   4. No aspirin/ibuprofen for one week before surgery.  If you are on aspirin, any non-steroidal anti-inflammatory medications (Mobic, Aleve, Ibuprofen) should not be taken seven (7) days prior to your surgery.  You make take Tylenol for pain prior to surgery.  5. Medications - If you are taking daily heart and blood pressure medications, seizure, reflux, allergy, asthma, anxiety, pain or diabetes medications, make sure you notify the surgery center/hospital before the day of surgery so they can tell you which medications you should take or avoid the day of surgery. 6. No food or drink after midnight the night before surgery unless directed otherwise by surgical center/hospital staff. 7. No alcoholic beverages 24-hours prior to surgery.  No smoking 24-hours prior or 24-hours after  surgery. 8. Wear loose pants or shorts. They should be loose enough to fit over bandages, boots, and casts. 9. Don't wear slip-on shoes. Sneakers are preferred. 10. Bring your boot with you to the surgery center/hospital.  Also bring crutches or a walker if your physician has prescribed it for you.  If you do not have this equipment, it will be provided for you after surgery. 11. If you have not been contacted by the surgery center/hospital by the day before your surgery, call to confirm the date and time of your surgery. 12. Leave-time from work may vary depending on the type of surgery you have.  Appropriate arrangements should be made prior to surgery with your employer. 13. Prescriptions will be provided immediately following surgery by your doctor.  Fill these as soon as possible after surgery and take the medication as directed. Pain medications will not be refilled on weekends and must be approved by the doctor. 14. Remove nail polish on the operative foot and avoid getting pedicures prior to surgery. 15. Wash the night before surgery.  The night before surgery wash the foot and leg well with water and the antibacterial soap provided. Be sure to pay special attention to beneath the toenails and in between the toes.  Wash for at least three (3) minutes. Rinse thoroughly with water and dry well with a towel.  Perform this wash unless told not to do so by your physician.  Enclosed: 1 Ice pack (please put in freezer the night before surgery)   1 Hibiclens skin cleaner     Pre-op instructions  If you have any questions regarding the instructions, please do not hesitate to call our office.  [place]: [address], [place], [place] [address] -- [phone]  [place]: [address] [place], [place] [address] -- [phone]  [place]: [address], [place], [place] [address] -- [phone]   Website: [email or web address] 

## 2019-03-28 NOTE — Progress Notes (Signed)
   Subjective: 48 y.o. female presents today as a new patient with a chief complaint of intermittent shooting pain of the right great toe secondary to a bunion that began about 1.5 years ago. Walking and certain shoes increases the pain. She has tried wearing different shoes and has taken antiinflammatories for treatment. Patient is here for further evaluation and treatment.    Past Medical History:  Diagnosis Date  . Sarcoidosis       Objective: Physical Exam General: The patient is alert and oriented x3 in no acute distress.  Dermatology: Skin is cool, dry and supple bilateral lower extremities. Negative for open lesions or macerations.  Vascular: Palpable pedal pulses bilaterally. No edema or erythema noted. Capillary refill within normal limits.  Neurological: Epicritic and protective threshold grossly intact bilaterally.   Musculoskeletal Exam: Clinical evidence of bunion deformity noted to the respective foot. There is moderate pain on palpation range of motion of the first MPJ. Lateral deviation of the hallux noted consistent with hallux abductovalgus.  Radiographic Exam: Increased intermetatarsal angle greater than 15 with a hallux abductus angle greater than 30 noted on AP view. Moderate degenerative changes noted within the first MPJ.  Assessment: 1. HAV w/ bunion deformity bilateral, right greater than left     Plan of Care:  1. Patient was evaluated. X-Rays reviewed. 2. Today we discussed the conservative versus surgical management of the presenting pathology. The patient opts for surgical management. All possible complications and details of the procedure were explained. All patient questions were answered. No guarantees were expressed or implied. 3. Authorization for surgery was initiated today. Surgery will consist of bunionectomy with osteotomy right.  4. Return to clinic one week post op.   Works, mostly sitting, at [employer].    Edrick Kins, DPM [facility]  Dr. Edrick Kins, [address]                                        [place], [place] [address]                Office [phone]  Fax [phone]

## 2019-04-16 ENCOUNTER — Ambulatory Visit (INDEPENDENT_AMBULATORY_CARE_PROVIDER_SITE_OTHER): Payer: 59 | Admitting: Family Medicine

## 2019-04-16 ENCOUNTER — Encounter: Payer: Self-pay | Admitting: Family Medicine

## 2019-04-16 ENCOUNTER — Other Ambulatory Visit: Payer: Self-pay

## 2019-04-16 VITALS — BP 110/86 | HR 68 | Temp 98.4°F | Resp 15 | Ht 65.0 in | Wt 223.0 lb

## 2019-04-16 DIAGNOSIS — Z6837 Body mass index (BMI) 37.0-37.9, adult: Secondary | ICD-10-CM

## 2019-04-16 DIAGNOSIS — D869 Sarcoidosis, unspecified: Secondary | ICD-10-CM | POA: Diagnosis not present

## 2019-04-16 DIAGNOSIS — Z0001 Encounter for general adult medical examination with abnormal findings: Secondary | ICD-10-CM | POA: Diagnosis not present

## 2019-04-16 DIAGNOSIS — R55 Syncope and collapse: Secondary | ICD-10-CM | POA: Diagnosis not present

## 2019-04-16 DIAGNOSIS — E669 Obesity, unspecified: Secondary | ICD-10-CM | POA: Insufficient documentation

## 2019-04-16 DIAGNOSIS — E6609 Other obesity due to excess calories: Secondary | ICD-10-CM | POA: Diagnosis not present

## 2019-04-16 HISTORY — DX: Encounter for general adult medical examination with abnormal findings: Z00.01

## 2019-04-16 HISTORY — DX: Syncope and collapse: R55

## 2019-04-16 NOTE — Assessment & Plan Note (Signed)
Discussed monthly self breast exams and yearly mammograms; at least 30 minutes of aerobic activity at least 5 days/week and weight-bearing exercise 2x/week; proper sunscreen use reviewed; healthy diet, including goals of calcium and vitamin D intake and alcohol recommendations (less than or equal to 1 drink/day) reviewed; regular seatbelt use; changing batteries in smoke detectors.  Immunization recommendations discussed.  Colonoscopy recommendations reviewed.  

## 2019-04-16 NOTE — Assessment & Plan Note (Signed)
Deteriorated  Hailey Ross is educated about the importance of exercise daily to help with weight management. A minumum of 30 minutes daily is recommended. Additionally, importance of healthy food choices  with portion control discussed.   Wt Readings from Last 3 Encounters:  04/16/19 223 lb (101.2 kg)  03/14/19 222 lb 0.6 oz (100.7 kg)  10/10/18 203 lb 1.9 oz (92.1 kg)

## 2019-04-16 NOTE — Assessment & Plan Note (Signed)
Concern for possible pre syncope episode that was not witnessed. EKG in office SB at 57 bpm and moderate voltage for LVH criteria, similar to previous EKG in 2018 for comparison. Possibly might have been from low oxygen due to coughing spell. No events since and none prior. Advised to go to ED if this happens again.

## 2019-04-16 NOTE — Assessment & Plan Note (Signed)
Has follow up with pulmonary within the month.

## 2019-04-16 NOTE — Progress Notes (Signed)
Health Maintenance reviewed -   Immunization History  Administered Date(s) Administered  . Pneumococcal Polysaccharide-23 02/12/2014  . Tdap 02/12/2014   Last Pap smear: January 2020 Last mammogram: January 2020 Last colonoscopy:  Last DEXA: N/a due at age 49 Dentist:  2 weeks ago  Ophtho: Wears glasses-coming up April 18 2019 Exercise: No currently   Other doctors caring for patient include:  Patient Care Team: Freddy Finner, NP as PCP - General (Family Medicine)  End of Life Discussion:  Patient does not have a living will and medical power of attorney.    Code Status: Prior   Subjective:   HPI  Hailey Ross is a 49 y.o. female who presents for annual wellness visit and follow-up on chronic medical conditions.  She has the following concerns: presyncope episode on Dec 26th, "blacked out" almost fell. Reports was in the middle of having a cough spell when this happened. Dropped her coffee cup and then once the cup hit the floor she came to and caught herself before falling. She did not call that day nor did she go to the ED.  Denies injury, palpitations, chest pain, or leg swelling. Denies events like this in past or since. Reports she did have associated dizziness several times that day.  Review Of Systems  Review of Systems  Constitutional: Negative.   HENT: Negative.   Eyes: Negative.  Negative for visual disturbance.  Respiratory: Positive for cough and shortness of breath.        Baseline-  Cardiovascular: Negative.  Negative for chest pain, palpitations and leg swelling.  Gastrointestinal: Negative.   Endocrine: Positive for polyuria.  Genitourinary: Negative.   Musculoskeletal: Negative.   Skin: Negative.   Allergic/Immunologic: Negative.   Neurological: Negative.  Negative for dizziness and headaches.  Hematological: Negative.   Psychiatric/Behavioral: Negative.   All other systems reviewed and are negative.   Objective:   PHYSICAL  EXAM:  BP 110/86   Pulse 68   Temp 98.4 F (36.9 C) (Oral)   Resp 15   Ht 5\' 5"  (1.651 m)   Wt 223 lb (101.2 kg)   SpO2 91%   BMI 37.11 kg/m   Physical Exam Vitals and nursing note reviewed.  Constitutional:      Appearance: Normal appearance. She is well-developed and well-groomed. She is obese.  HENT:     Head: Normocephalic and atraumatic.     Right Ear: Hearing, tympanic membrane, ear canal and external ear normal.     Left Ear: Hearing, tympanic membrane, ear canal and external ear normal.     Ears:     Weber exam findings: does not lateralize.    Right Rinne: AC > BC.    Left Rinne: AC > BC.    Nose: Nose normal.     Mouth/Throat:     Lips: Pink.     Mouth: Mucous membranes are moist.     Pharynx: Oropharynx is clear. Uvula midline.  Eyes:     General: Lids are normal.     Extraocular Movements: Extraocular movements intact.     Conjunctiva/sclera: Conjunctivae normal.     Pupils: Pupils are equal, round, and reactive to light.  Neck:     Thyroid: No thyroid mass, thyromegaly or thyroid tenderness.     Vascular: No carotid bruit.  Cardiovascular:     Rate and Rhythm: Normal rate and regular rhythm.     Pulses: Normal pulses.  Radial pulses are 2+ on the right side and 2+ on the left side.       Dorsalis pedis pulses are 2+ on the right side and 2+ on the left side.     Heart sounds: Normal heart sounds.  Pulmonary:     Effort: Pulmonary effort is normal.     Breath sounds: Normal breath sounds.  Abdominal:     General: Abdomen is flat. Bowel sounds are normal.     Palpations: Abdomen is soft.     Tenderness: There is no abdominal tenderness. There is no right CVA tenderness or left CVA tenderness.  Musculoskeletal:        General: Normal range of motion.     Cervical back: Normal range of motion and neck supple.     Right lower leg: No edema.     Left lower leg: No edema.  Lymphadenopathy:     Cervical: No cervical adenopathy.  Skin:     General: Skin is warm and dry.     Capillary Refill: Capillary refill takes less than 2 seconds.  Neurological:     General: No focal deficit present.     Mental Status: She is alert and oriented to person, place, and time. Mental status is at baseline.     Cranial Nerves: Cranial nerves are intact.     Sensory: Sensation is intact.     Motor: Motor function is intact.     Coordination: Coordination is intact.     Gait: Gait is intact.     Deep Tendon Reflexes: Reflexes are normal and symmetric.  Psychiatric:        Attention and Perception: Attention and perception normal.        Mood and Affect: Mood and affect normal.        Speech: Speech normal.        Behavior: Behavior normal. Behavior is cooperative.        Thought Content: Thought content normal.        Cognition and Memory: Cognition and memory normal.        Judgment: Judgment normal.    Depression Screening  Depression screen [facility] 2/9 04/16/2019 03/14/2019 10/10/2018 02/12/2014  Decreased Interest 0 0 0 0  Down, Depressed, Hopeless 0 0 0 0  PHQ - 2 Score 0 0 0 0      Assessment & Plan:    Plan: Please see assessment and plan per problem list above.  1. Annual visit for general adult medical examination with abnormal findings  2. Class 2 obesity due to excess calories with body mass index (BMI) of 37.0 to 37.9 in adult, unspecified whether serious comorbidity present  3. Sarcoidosis  4. Pre-syncope    Lab Orders     CBC     COMPLETE METABOLIC PANEL WITH GFR     Hemoglobin A1c     Lipid panel     VITAMIN D 25 Hydroxy     TSH   The patient's weight, height, BMI, and visual acuity have been recorded in the chart.  I have made referrals, counseling, and provided education to the patient based on review of the above and I have provided the patient with a written personalized care plan for preventive services.     Follow up: 6 months   Perlie Mayo, NP   04/16/2019

## 2019-04-16 NOTE — Patient Instructions (Addendum)
I appreciate the opportunity to provide you with care for your health and wellness. Today we discussed: overall health   Follow up: 6 months   Labs placed today- please get when fasting No referrals today  HEALTH MAINTENANCE RECOMMENDATIONS:  It is recommended that you get at least 30 minutes of aerobic exercise at least 5 days/week (for weight loss, you may need as much as 60-90 minutes). This can be any activity that gets your heart rate up. This can be divided in 10-15 minute intervals if needed, but try and build up your endurance at least once a week.  Weight bearing exercise is also recommended twice weekly.  Eat a healthy diet with lots of vegetables, fruits and fiber.  "Colorful" foods have a lot of vitamins (ie green vegetables, tomatoes, red peppers, etc).  Limit sweet tea, regular sodas and alcoholic beverages, all of which has a lot of calories and sugar.  Up to 1 alcoholic drink daily may be beneficial for women (unless trying to lose weight, watch sugars).  Drink a lot of water.  Calcium recommendations are 1200-1500 mg daily (1500 mg for postmenopausal women or women without ovaries), and vitamin D 1000 IU daily.  This should be obtained from diet and/or supplements (vitamins), and calcium should not be taken all at once, but in divided doses.  Monthly self breast exams and yearly mammograms for women over the age of 29 is recommended.  Sunscreen of at least SPF 30 should be used on all sun-exposed parts of the skin when outside between the hours of 10 am and 4 pm (not just when at beach or pool, but even with exercise, golf, tennis, and yard work!)  Use a sunscreen that says "broad spectrum" so it covers both UVA and UVB rays, and make sure to reapply every 1-2 hours.  Remember to change the batteries in your smoke detectors when changing your clock times in the spring and fall.  Use your seat belt every time you are in a car, and please drive safely and not be distracted with  cell phones and texting while driving.  Please continue to practice social distancing to keep you, your family, and our community safe.  If you must go out, please wear a mask and practice good handwashing.  It was a pleasure to see you and I look forward to continuing to work together on your health and well-being. Please do not hesitate to call the office if you need care or have questions about your care.  Have a wonderful day and week. With Gratitude, Tereasa Coop, DNP, AGNP-BC

## 2019-04-23 ENCOUNTER — Ambulatory Visit (INDEPENDENT_AMBULATORY_CARE_PROVIDER_SITE_OTHER)
Admission: RE | Admit: 2019-04-23 | Discharge: 2019-04-23 | Disposition: A | Discharge status: Home / Self Care | Payer: 59 | Source: Ambulatory Visit | Attending: Pulmonary Disease | Admitting: Pulmonary Disease

## 2019-04-23 ENCOUNTER — Encounter: Payer: Self-pay | Admitting: Pulmonary Disease

## 2019-04-23 ENCOUNTER — Other Ambulatory Visit: Payer: Self-pay

## 2019-04-23 ENCOUNTER — Ambulatory Visit (INDEPENDENT_AMBULATORY_CARE_PROVIDER_SITE_OTHER): Payer: 59 | Admitting: Pulmonary Disease

## 2019-04-23 VITALS — BP 110/74 | HR 74 | Temp 98.6°F | Ht 65.0 in | Wt 222.0 lb

## 2019-04-23 DIAGNOSIS — D869 Sarcoidosis, unspecified: Secondary | ICD-10-CM

## 2019-04-23 LAB — COMPLETE METABOLIC PANEL WITH GFR
AG Ratio: 1.3 (calc) (ref 1.0–2.5)
ALT: 15 U/L (ref 6–29)
AST: 24 U/L (ref 10–35)
Albumin: 4 g/dL (ref 3.6–5.1)
Alkaline phosphatase (APISO): 92 U/L (ref 31–125)
BUN/Creatinine Ratio: 13 (calc) (ref 6–22)
BUN: 15 mg/dL (ref 7–25)
CO2: 28 mmol/L (ref 20–32)
Calcium: 9.3 mg/dL (ref 8.6–10.2)
Chloride: 105 mmol/L (ref 98–110)
Creat: 1.2 mg/dL — ABNORMAL HIGH (ref 0.50–1.10)
GFR, Est African American: 61 mL/min/{1.73_m2} (ref >=60)
GFR, Est Non African American: 53 mL/min/{1.73_m2} — ABNORMAL LOW (ref >=60)
Globulin: 3.1 g/dL (calc) (ref 1.9–3.7)
Glucose, Bld: 86 mg/dL (ref 65–99)
Potassium: 4.1 mmol/L (ref 3.5–5.3)
Sodium: 141 mmol/L (ref 135–146)
Total Bilirubin: 0.6 mg/dL (ref 0.2–1.2)
Total Protein: 7.1 g/dL (ref 6.1–8.1)

## 2019-04-23 LAB — CBC
HCT: 40.4 % (ref 35.0–45.0)
Hemoglobin: 13.3 g/dL (ref 11.7–15.5)
MCH: 27.7 pg (ref 27.0–33.0)
MCHC: 32.9 g/dL (ref 32.0–36.0)
MCV: 84 fL (ref 80.0–100.0)
MPV: 10.1 fL (ref 7.5–12.5)
Platelets: 376 10*3/uL (ref 140–400)
RBC: 4.81 10*6/uL (ref 3.80–5.10)
RDW: 13 % (ref 11.0–15.0)
WBC: 11.8 10*3/uL — ABNORMAL HIGH (ref 3.8–10.8)

## 2019-04-23 LAB — HEMOGLOBIN A1C
Hgb A1c MFr Bld: 5.7 % of total Hgb — ABNORMAL HIGH (ref <5.7)
Mean Plasma Glucose: 117 (calc)
eAG (mmol/L): 6.5 (calc)

## 2019-04-23 LAB — TSH: TSH: 1.71 mIU/L

## 2019-04-23 LAB — LIPID PANEL
Cholesterol: 230 mg/dL — ABNORMAL HIGH (ref <200)
HDL: 58 mg/dL (ref >=50)
LDL Cholesterol (Calc): 152 mg/dL (calc) — ABNORMAL HIGH
Non-HDL Cholesterol (Calc): 172 mg/dL (calc) — ABNORMAL HIGH (ref <130)
Total CHOL/HDL Ratio: 4 (calc) (ref <5.0)
Triglycerides: 93 mg/dL (ref <150)

## 2019-04-23 LAB — VITAMIN D 25 HYDROXY (VIT D DEFICIENCY, FRACTURES): Vit D, 25-Hydroxy: 17 ng/mL — ABNORMAL LOW (ref 30–100)

## 2019-04-23 MED ORDER — ALBUTEROL SULFATE HFA 108 (90 BASE) MCG/ACT IN AERS: 2.0000 | INHALATION_SPRAY | Freq: Four times a day (QID) | RESPIRATORY_TRACT | 5 refills | Status: DC | PRN

## 2019-04-23 NOTE — Patient Instructions (Signed)
Chest xray today  Albuterol two puffs every 6 hours as needed for cough, wheeze, or chest congestion  Follow up in 6 months

## 2019-04-23 NOTE — Progress Notes (Signed)
[facility]  Chief Complaint  Patient presents with  . Follow-up    Sarcoidosis    Constitutional:  BP 110/74 (BP Location: Right Arm, Cuff Size: Normal)   Pulse 74   Temp 98.6 F (37 C) (Temporal)   Ht 5' 5"  (1.651 m)   Wt 222 lb (100.7 kg)   SpO2 95% Comment: RA  BMI 36.94 kg/m   Past Medical History:  Sarcoidosis  Brief Summary:  Hailey Ross is a 49 y.o. female with pulmonary sarcoidosis and bronchiectasis.  She has been off prednisone for past several months.  Breathing has been okay.  Gets occasional episodes of cough with clear sputum.  Not having fever, wheeze, skin rash, or leg swelling.  Has been using nebulizer intermittently and this helps.  Doesn't have albuterol inhaler.  Physical Exam:    Appearance - well kempt   ENMT - no sinus tenderness, no nasal discharge, no oral exudate  Neck - no masses, trachea midline, no thyromegaly, no elevation in JVP  Respiratory - normal appearance of chest wall, normal respiratory effort w/o accessory muscle use, no dullness on percussion, no wheezing or rales  CV - s1s2 regular rate and rhythm, no murmurs, no peripheral edema, radial pulses symmetric  GI - soft, non tender  Lymph - no adenopathy noted in neck and axillary areas  MSK - normal gait  Ext - no cyanosis, clubbing, or joint inflammation noted  Skin - no rashes, lesions, or ulcers  Neuro - normal strength, oriented x 3  Psych - normal mood and affect    Assessment/Plan:   Pulmonary sarcoidosis with bronchiectasis. - off prednisone now - repeat chest xray today and then determine if she needs repeat CT chest - prn albuterol  COVID 19 vaccine. - discussed pros/cons and known side effect profile - advised her to get vaccine   Patient Instructions  Chest xray today  Albuterol two puffs every 6 hours as needed for cough, wheeze, or chest congestion  Follow up in 6 months  A total of  24  minutes were spent face to face and non face to face with the patient and more than half of that time involved counseling or coordination of care.   Chesley Mires, MD [facility] Pager: [phone] 04/23/2019, 12:43 PM  Flow Sheet     Pulmonary tests:  Labs 04/21/15 >> HIV negative, Quantiferon gold negative, ACE 195, ANA, RF negative, ESR 32 PFT 05/26/15 >> FEV1 1.67 (66%), FEV1% 88, TLC 2/78 (53%), DLCO 36%  Chest imaging:  CT chest 03/18/15 >> biapical thickening with traction BTX, BTX RML and lingula, subcarinal LAN 1.7 cm, patchy increased interstitial markings HRCT chest 03/07/18 >> borderline LAN, patchy GGO, septal thickening, architectural distortion, cylindrical and varicose BTX  Medications:   Allergies as of 04/23/2019   No Known Allergies     Medication List       Accurate as of April 23, 2019 12:43 PM. If you have any questions, ask your nurse or doctor.        albuterol 108 (90 Base) MCG/ACT inhaler Commonly known as: ProAir HFA Inhale 2 puffs into the lungs every 6 (six) hours as needed for wheezing or shortness of breath.   Aleve 220 MG tablet Generic drug: naproxen sodium Take 220-440 mg by mouth daily as needed (for pain).   Flutter Devi Use flutter valve 3 times a day       Past Surgical History:  She  has  a past surgical history that includes Tubal ligation (02/1993) and Cholecystectomy (N/A, 08/25/2016).  Family History:  Her family history includes Hypertension in her mother.  Social History:  She  reports that she quit smoking about 4 years ago. Her smoking use included cigarettes. She has a 20.00 pack-year smoking history. She has never used smokeless tobacco. She reports current alcohol use. She reports that she does not use drugs.

## 2019-04-24 ENCOUNTER — Telehealth: Payer: Self-pay | Admitting: Pulmonary Disease

## 2019-04-24 NOTE — Telephone Encounter (Signed)
Called the patient and made her aware of the results. Patient voiced understanding. Nothing further needed at this time. 

## 2019-04-24 NOTE — Telephone Encounter (Signed)
DG Chest 2 View  Result Date: 04/23/2019 CLINICAL DATA:  49 year old female with history of sarcoidosis. Follow-up study. EXAM: CHEST - 2 VIEW COMPARISON:  Chest x-ray 06/12/2017. FINDINGS: Scattered areas of asymmetrically distributed interstitial prominence and reticulonodular opacities are again noted throughout the lungs bilaterally, very similar to the prior study from 06/12/2017. No acute consolidative airspace disease. No pleural effusions. No evidence of pulmonary edema. Heart size is normal. Prominence of hilar areas bilaterally (left greater than right), likely reflects underlying lymphadenopathy. Aortic atherosclerosis. IMPRESSION: 1. The appearance the chest is compatible with the reported clinical history of sarcoidosis, very similar to prior study from 06/12/2017, as detailed above. 2. Aortic atherosclerosis. Electronically Signed   By: Trudie Reed M.D.   On: 04/23/2019 20:25     Please let her know that CXR doesn't show any progression for sarcoidosis in her lungs.  She doesn't need additional prednisone at this time.

## 2019-04-28 ENCOUNTER — Other Ambulatory Visit: Payer: Self-pay | Admitting: Family Medicine

## 2019-04-28 DIAGNOSIS — E559 Vitamin D deficiency, unspecified: Secondary | ICD-10-CM

## 2019-04-28 DIAGNOSIS — E785 Hyperlipidemia, unspecified: Secondary | ICD-10-CM

## 2019-04-28 DIAGNOSIS — N182 Chronic kidney disease, stage 2 (mild): Secondary | ICD-10-CM

## 2019-04-28 DIAGNOSIS — D869 Sarcoidosis, unspecified: Secondary | ICD-10-CM

## 2019-04-28 MED ORDER — ROSUVASTATIN CALCIUM 5 MG PO TABS: 5.0000 mg | ORAL_TABLET | Freq: Every day | ORAL | 1 refills | Status: DC

## 2019-04-28 MED ORDER — VITAMIN D (ERGOCALCIFEROL) 1.25 MG (50000 UNIT) PO CAPS: 50000.0000 [IU] | ORAL_CAPSULE | ORAL | 1 refills | Status: DC

## 2019-05-06 ENCOUNTER — Ambulatory Visit (HOSPITAL_COMMUNITY)
Admission: EM | Admit: 2019-05-06 | Discharge: 2019-05-06 | Disposition: A | Discharge status: Home / Self Care | Payer: 59 | Attending: Internal Medicine | Admitting: Internal Medicine

## 2019-05-06 ENCOUNTER — Encounter (HOSPITAL_COMMUNITY): Payer: Self-pay

## 2019-05-06 ENCOUNTER — Other Ambulatory Visit: Payer: Self-pay

## 2019-05-06 ENCOUNTER — Telehealth: Payer: Self-pay

## 2019-05-06 DIAGNOSIS — R42 Dizziness and giddiness: Secondary | ICD-10-CM

## 2019-05-06 NOTE — ED Triage Notes (Signed)
Patient presents to Urgent Care with complaints of light headedness since 3 days ago. Patient reports her PCP did not have an appt so told her to come here for evaluation, A&Ox4, no neuro deficits noted at this time.

## 2019-05-06 NOTE — ED Provider Notes (Signed)
[facility]    CSN: [id number] Arrival date & time: 05/06/19  1336      History   Chief Complaint Chief Complaint  Patient presents with  . Appointment    1:30  . Dizziness    HPI Hailey Ross is a 49 y.o. female with a history of sarcoidosis comes to the urgent care with a couple of days history of lightheadedness.  Onset was fairly sudden.  Is not associated with any near-syncope or syncopal events.  Patient denies any falls or head injuries.  No upper respiratory infection symptoms.  No difficulty with hearing, ringing in the ears, nausea or vomiting.  No sick contacts.  Patient was recently evaluated by the primary care physician during an annual visit and was found to have vitamin D deficiency.  She has been started on vitamin D and calcium supplementation.   HPI  Past Medical History:  Diagnosis Date  . Acute cholecystitis 08/24/2016  . Sarcoidosis   . Smoker 02/12/2014    Patient Active Problem List   Diagnosis Date Noted  . Class 2 obesity due to excess calories with body mass index (BMI) of 37.0 to 37.9 in adult 04/16/2019  . Annual visit for general adult medical examination with abnormal findings 04/16/2019  . Pre-syncope 04/16/2019  . Sarcoidosis 05/04/2015  . Bronchiectasis (HCC) 05/04/2015    Past Surgical History:  Procedure Laterality Date  . CHOLECYSTECTOMY N/A 08/25/2016   Procedure: LAPAROSCOPIC CHOLECYSTECTOMY;  Surgeon: Abigail Miyamoto, MD;  Location: [facility] OR;  Service: General;  Laterality: N/A;  . TUBAL LIGATION  02/1993    OB History   No obstetric history on file.      Home Medications    Prior to Admission medications   Medication Sig Start Date End Date Taking? Authorizing Provider  albuterol (PROAIR HFA) 108 (90 Base) MCG/ACT inhaler Inhale 2 puffs into the lungs every 6 (six) hours as needed for wheezing or shortness of breath. 04/23/19   Coralyn Helling, MD  naproxen sodium (ALEVE) 220 MG tablet Take 220-440 mg by mouth  daily as needed (for pain).    [provider]  Respiratory Therapy Supplies (FLUTTER) DEVI Use flutter valve 3 times a day 03/30/18   Glenford Bayley, NP  rosuvastatin (CRESTOR) 5 MG tablet Take 1 tablet (5 mg total) by mouth daily. 04/28/19   Freddy Finner, NP  Vitamin D, Ergocalciferol, (DRISDOL) 1.25 MG (50000 UNIT) CAPS capsule Take 1 capsule (50,000 Units total) by mouth every 7 (seven) days. 04/28/19   Freddy Finner, NP    Family History Family History  Problem Relation Age of Onset  . Hypertension Mother   . Cancer Mother     Social History Social History   Tobacco Use  . Smoking status: Former Smoker    Packs/day: 1.00    Years: 20.00    Pack years: 20.00    Types: Cigarettes    Quit date: 03/21/2015    Years since quitting: 4.1  . Smokeless tobacco: Never Used  Substance Use Topics  . Alcohol use: Yes    Alcohol/week: 0.0 standard drinks    Comment: Rare  . Drug use: No     Allergies   Patient has no known allergies.   Review of Systems Review of Systems  Constitutional: Negative for activity change, chills, fatigue and fever.  Respiratory: Negative for cough, choking and shortness of breath.   Gastrointestinal: Negative for diarrhea, nausea and vomiting.  Genitourinary: Negative for dysuria, frequency, pelvic  pain and urgency.  Musculoskeletal: Negative for arthralgias, gait problem, joint swelling and myalgias.  Psychiatric/Behavioral: Negative for confusion and decreased concentration.     Physical Exam Triage Vital Signs ED Triage Vitals  Enc Vitals Group     BP 05/06/19 1405 (!) 136/91     Pulse Rate 05/06/19 1405 78     Resp 05/06/19 1405 16     Temp 05/06/19 1405 98.7 F (37.1 C)     Temp Source 05/06/19 1405 Oral     SpO2 05/06/19 1405 99 %     Weight --      Height --      Head Circumference --      Peak Flow --      Pain Score 05/06/19 1404 0     Pain Loc --      Pain Edu? --      Excl. in [place]? --    Orthostatic VS  for the past 24 hrs:  BP- Lying Pulse- Lying BP- Sitting Pulse- Sitting BP- Standing at 0 minutes Pulse- Standing at 0 minutes  05/06/19 1452 (!) 136/92 67 160/89 74 142/90 71    Updated Vital Signs BP (!) 136/91 (BP Location: Left Arm)   Pulse 78   Temp 98.7 F (37.1 C) (Oral)   Resp 16   LMP 04/17/2019   SpO2 99%   Visual Acuity Right Eye Distance:   Left Eye Distance:   Bilateral Distance:    Right Eye Near:   Left Eye Near:    Bilateral Near:     Physical Exam Vitals and nursing note reviewed.  Constitutional:      General: She is not in acute distress.    Appearance: Normal appearance. She is not ill-appearing.  HENT:     Right Ear: Tympanic membrane normal.     Left Ear: Tympanic membrane normal.  Cardiovascular:     Rate and Rhythm: Normal rate and regular rhythm.     Pulses: Normal pulses.     Heart sounds: Normal heart sounds.  Pulmonary:     Effort: Pulmonary effort is normal. No respiratory distress.     Breath sounds: Normal breath sounds. No wheezing or rhonchi.  Abdominal:     General: Bowel sounds are normal.     Palpations: Abdomen is soft.  Skin:    Capillary Refill: Capillary refill takes less than 2 seconds.  Neurological:     General: No focal deficit present.     Mental Status: She is alert and oriented to person, place, and time.      UC Treatments / Results  Labs (all labs ordered are listed, but only abnormal results are displayed) Labs Reviewed - No data to display  EKG   Radiology No results found.  Procedures Procedures (including critical care time)  Medications Ordered in UC Medications - No data to display  Initial Impression / Assessment and Plan / UC Course  I have reviewed the triage vital signs and the nursing notes.  Pertinent labs & imaging results that were available during my care of the patient were reviewed by me and considered in my medical decision making (see chart for details).     1.   Lightheadedness, etiology obscure: TSH is normal, vitamin D is 17 Patient is on vitamin D and calcium I encouraged patient to continue those medications. Patient can follow-up with primary care physician if symptoms continue to worsen Orthostatic blood pressures were unremarkable. Final Clinical Impressions(s) / UC Diagnoses  Final diagnoses:  Lightheadedness   Discharge Instructions   None    ED Prescriptions    None     PDMP not reviewed this encounter.   Merrilee Jansky, MD 05/06/19 [phone]

## 2019-05-06 NOTE — Telephone Encounter (Signed)
Patient called stating she had been feeling light headed, but not dizzy. She describes it as sort of "loopy" feeling. Advised patient to seek evaluation at an urgent care and scheduled patient an appt to see provider on Thursday 05/09/19 at 9am.

## 2019-05-09 ENCOUNTER — Encounter: Payer: Self-pay | Admitting: Family Medicine

## 2019-05-09 ENCOUNTER — Other Ambulatory Visit: Payer: Self-pay

## 2019-05-09 ENCOUNTER — Ambulatory Visit (INDEPENDENT_AMBULATORY_CARE_PROVIDER_SITE_OTHER): Payer: 59 | Admitting: Family Medicine

## 2019-05-09 VITALS — BP 108/80 | HR 68 | Temp 97.1°F | Resp 15 | Ht 65.0 in | Wt 225.1 lb

## 2019-05-09 DIAGNOSIS — R42 Dizziness and giddiness: Secondary | ICD-10-CM

## 2019-05-09 DIAGNOSIS — E559 Vitamin D deficiency, unspecified: Secondary | ICD-10-CM

## 2019-05-09 MED ORDER — MECLIZINE HCL 12.5 MG PO TABS: 12.5000 mg | ORAL_TABLET | Freq: Two times a day (BID) | ORAL | 0 refills | Status: DC | PRN

## 2019-05-09 NOTE — Progress Notes (Signed)
Subjective:  Patient ID: Hailey Ross, female    DOB: 10/10/1970  Age: 49 y.o. MRN: [id number]  CC:  Chief Complaint  Patient presents with  . Dizziness    pt states she doesn't feel dizzy but she feels light headed/loopy      HPI  HPI  Hailey Ross is a patient of mine she is a 49 year old female who has a history of sarcoidosis, bronchiectasis, obesity. Presents today for follow-up on lightheadedness and dizziness.  She called back on January 25 saying that she was feeling lightheaded but not dizzy.  She describes it has sort of a "loopy" feeling.  I was not in the office she was advised to seek care at the urgent care and was set up an appointment for the 28th.  She presented to the urgent care on the 25th.  Reports of couple days of history of lightheadedness.  Onset she report was fairly sudden.  It was not associated with any near-syncope or syncopal events.  She denied having any falls or head injuries or trauma.  She had no upper respiratory infection symptoms.  No difficulty with hearing, ringing in the ears, nausea or vomiting.  She denied having any sick contacts or exposure to Covid.  Only new changes had been starting her on vitamin D supplement and calcium supplementation as she had a vitamin D deficiency found at her annual visit the beginning of January.  Blood pressure was a little bit elevated at the urgent care noted to be 136/91.  All other vital signs were normal range.  Orthostatics were unremarkable.  She was advised to follow-up with PCP.  Physical examination was unremarkable.  Lightheadedness with an unknown etiology was reported.  Recent thyroid was normal.  Vitamin D was 17 started on vitamin D and calcium.  Urgent care provider advised her to continue the supplements.  Today she reports that she feels like it is improved some.  She denies having any headache, trouble with moving her eyes.  Denies having any ongoing numbness in her hands or arms.  But  reports that when she was at the urgent care she felt like she needed to make fist as she had some slight tingling down the arms into the hands.  This was not noted in the note at the urgent care.  She notes that the lightheadedness feels like her body was out of sorts like she had possibly been drinking but she had not been.  She denied having the room spinning or the environment around her spinning.  Just as before she denied having any recent exposure to being sick no trauma.  She reports that she might not be hydrating enough.  But overall she has no other complaints.   Today patient denies signs and symptoms of COVID 19 infection including fever, chills, cough, shortness of breath, and headache. Past Medical, Surgical, Social History, Allergies, and Medications have been Reviewed.   Past Medical History:  Diagnosis Date  . Acute cholecystitis 08/24/2016  . Sarcoidosis   . Smoker 02/12/2014    Current Meds  Medication Sig  . albuterol (PROAIR HFA) 108 (90 Base) MCG/ACT inhaler Inhale 2 puffs into the lungs every 6 (six) hours as needed for wheezing or shortness of breath.  . naproxen sodium (ALEVE) 220 MG tablet Take 220-440 mg by mouth daily as needed (for pain).  Marland Kitchen Respiratory Therapy Supplies (FLUTTER) DEVI Use flutter valve 3 times a day  . rosuvastatin (CRESTOR) 5 MG tablet Take  1 tablet (5 mg total) by mouth daily.  . Vitamin D, Ergocalciferol, (DRISDOL) 1.25 MG (50000 UNIT) CAPS capsule Take 1 capsule (50,000 Units total) by mouth every 7 (seven) days.    ROS:  Review of Systems  Constitutional: Negative.   HENT: Negative.   Eyes: Negative.   Respiratory: Negative.   Cardiovascular: Negative.   Gastrointestinal: Negative.   Genitourinary: Negative.   Musculoskeletal: Negative.   Skin: Negative.   Neurological: Positive for tingling. Negative for seizures, loss of consciousness, weakness and headaches.       Lightheadedness   Endo/Heme/Allergies: Negative.    Psychiatric/Behavioral: Negative.   All other systems reviewed and are negative.    Objective:   Today's Vitals: BP 108/80   Pulse 68   Temp (!) 97.1 F (36.2 C) (Temporal)   Resp 15   Ht 5\' 5"  (1.651 m)   Wt 225 lb 1.3 oz (102.1 kg)   LMP 04/17/2019   SpO2 91%   BMI 37.46 kg/m  Vitals with BMI 05/09/2019 05/06/2019 04/23/2019  Height 5\' 5"  - 5\' 5"   Weight 225 lbs 1 oz - 222 lbs  BMI 37.46 - 36.94  Systolic 108 136 06/21/2019  Diastolic 80 91 74  Pulse 68 78 74     Physical Exam Vitals and nursing note reviewed.  Constitutional:      Appearance: Normal appearance. She is well-developed and well-groomed. She is obese.  HENT:     Head: Normocephalic and atraumatic.     Comments: Mask in place    Right Ear: External ear normal.     Left Ear: External ear normal.  Eyes:     General: Lids are normal.        Right eye: No discharge.        Left eye: No discharge.     Extraocular Movements: Extraocular movements intact.     Conjunctiva/sclera: Conjunctivae normal.  Cardiovascular:     Rate and Rhythm: Normal rate and regular rhythm.     Pulses: Normal pulses.     Heart sounds: Normal heart sounds.  Pulmonary:     Effort: Pulmonary effort is normal.     Breath sounds: Normal breath sounds.  Musculoskeletal:        General: Normal range of motion.     Cervical back: Normal range of motion and neck supple.  Skin:    General: Skin is warm.  Neurological:     General: No focal deficit present.     Mental Status: She is alert and oriented to person, place, and time.     Cranial Nerves: Cranial nerves are intact.     Sensory: Sensation is intact.     Motor: Motor function is intact.     Coordination: Coordination is intact.     Gait: Gait is intact.  Psychiatric:        Attention and Perception: Attention normal.        Mood and Affect: Mood normal.        Speech: Speech normal.        Behavior: Behavior normal. Behavior is cooperative.        Thought Content: Thought  content normal.        Cognition and Memory: Cognition normal.        Judgment: Judgment normal.    EKG: Reviewed personally: SB 58. No other changes noted, Consistent with previously EKG.   Assessment   1. Vertigo   2. Vitamin D deficiency     Tests  ordered No orders of the defined types were placed in this encounter.    Plan: Please see assessment and plan per problem list above.   Meds ordered this encounter  Medications  . meclizine (ANTIVERT) 12.5 MG tablet    Sig: Take 1 tablet (12.5 mg total) by mouth 2 (two) times daily as needed for dizziness.    Dispense:  30 tablet    Refill:  0    Order Specific Question:   Supervising Provider    Answer:   Fayrene Helper [[id number]]    Patient to follow-up in 10/15/2019 .  Perlie Mayo, NP

## 2019-05-09 NOTE — Patient Instructions (Addendum)
Happy New Year! May you have a year filled with hope, love, happiness and laughter.  I appreciate the opportunity to provide you with care for your health and wellness. Today we discussed: dizziness   Follow up: 10/15/2019 (or as needed)  No labs or referrals today  Meclizine is prescribed today to help with dizziness. Take as directed  If you ever feel like you are going to pass out or the medication does not help and you get worse, call me. We will discuss a referral to Cardiology.   Stay hydrated, avoid caffeine, and get plenty of sleep  Please continue to practice social distancing to keep you, your family, and our community safe.  If you must go out, please wear a mask and practice good handwashing.  It was a pleasure to see you and I look forward to continuing to work together on your health and well-being. Please do not hesitate to call the office if you need care or have questions about your care.  Have a wonderful day and week. With Gratitude, Tereasa Coop, DNP, AGNP-BC

## 2019-05-10 ENCOUNTER — Encounter: Payer: Self-pay | Admitting: Family Medicine

## 2019-05-10 DIAGNOSIS — R42 Dizziness and giddiness: Secondary | ICD-10-CM | POA: Insufficient documentation

## 2019-05-10 DIAGNOSIS — E559 Vitamin D deficiency, unspecified: Secondary | ICD-10-CM | POA: Insufficient documentation

## 2019-05-10 HISTORY — DX: Dizziness and giddiness: R42

## 2019-05-10 NOTE — Assessment & Plan Note (Signed)
Encouraged to continue supplement at this time.

## 2019-05-10 NOTE — Assessment & Plan Note (Signed)
Lightheadness reported. Work up at [employer] and in office unremarkable. Might be hydration related. Advised to continue all medications. Drink more water. Walk to get exercise daily.  Can take antivert as needed, if not improved with this or worsens, we ill do cardiology referral.  EKG is SB, but otherwise stable.    Reviewed side effects, risks and benefits of medication.   Patient acknowledged agreement and understanding of the plan.

## 2019-05-15 ENCOUNTER — Telehealth: Payer: Self-pay | Admitting: *Deleted

## 2019-05-15 NOTE — Telephone Encounter (Signed)
"  I need to reschedule my surgery date.  My mom is having surgery and I have to take care of her."  What date are you scheduled for and who is your doctor?  "I can't remember my doctor's name.  I'm scheduled for February 17."  We have you down for May 30, 2019 with Dr. Logan Bores.  Do you have a date that you would like to reschedule it to?  "I'd like to do it at the end of this month or the beginning of March."  Dr. Logan Bores can do it on June 20, 2019.  "Perfect, that date will be fine."  I'll get it rescheduled.  I called and asked Aram Beecham at [facility] to reschedule [name]' surgery from 05/30/2019 to 06/20/2019.

## 2019-05-27 ENCOUNTER — Other Ambulatory Visit: Payer: Self-pay

## 2019-05-27 DIAGNOSIS — E785 Hyperlipidemia, unspecified: Secondary | ICD-10-CM

## 2019-05-27 MED ORDER — ROSUVASTATIN CALCIUM 5 MG PO TABS: 5.0000 mg | ORAL_TABLET | Freq: Every day | ORAL | 1 refills | Status: DC

## 2019-06-03 ENCOUNTER — Telehealth: Payer: Self-pay

## 2019-06-03 NOTE — Telephone Encounter (Signed)
Per the pt she is starting to light headed and dizzy again, and she is calling back in to advise. You had advised a referral to the heart Dr

## 2019-06-03 NOTE — Telephone Encounter (Signed)
Patient states it is happening again but ever so often. She also states she is scheduled for foot surgery March 10th. Stated you may have wanted her to see a cardiologist if it started again. Patient agrees to go to ED if symptoms worsen.

## 2019-06-04 ENCOUNTER — Encounter: Payer: Self-pay | Admitting: Podiatry

## 2019-06-05 NOTE — Telephone Encounter (Signed)
Patient states she is still having symptoms on and off so they are the same. Advised her the provider was out of the office the rest of the week and she needed to be evaluated at an UC with verbal understanding.

## 2019-06-08 ENCOUNTER — Encounter: Payer: Self-pay | Admitting: Family Medicine

## 2019-06-10 DIAGNOSIS — I2699 Other pulmonary embolism without acute cor pulmonale: Secondary | ICD-10-CM

## 2019-06-10 HISTORY — DX: Other pulmonary embolism without acute cor pulmonale: I26.99

## 2019-06-11 ENCOUNTER — Encounter: Payer: Self-pay | Admitting: Family Medicine

## 2019-06-12 ENCOUNTER — Telehealth: Payer: Self-pay | Admitting: Pulmonary Disease

## 2019-06-12 NOTE — Telephone Encounter (Signed)
Hailey Ross, please advise if you have seen a form on pt.

## 2019-06-12 NOTE — Telephone Encounter (Signed)
Forms were in mail slot for Dr. Craige Cotta in pod C. Form placed in Dr. Craige Cotta look at folder and placed on his desk for review.

## 2019-06-13 NOTE — Telephone Encounter (Signed)
LVM foir Deanna Artis with [employer] to advise her the paperwork sent to the attention of Dr. Craige Cotta has been signed and will be faxed to the number provided and to call our office if there is any information that is missing or needed.  Paperwork faxed. Nothing further needed.

## 2019-06-13 NOTE — Telephone Encounter (Signed)
Form completed and in my office. 

## 2019-06-18 ENCOUNTER — Encounter (HOSPITAL_COMMUNITY): Payer: Self-pay | Admitting: Emergency Medicine

## 2019-06-18 ENCOUNTER — Inpatient Hospital Stay (HOSPITAL_COMMUNITY)
Admission: EM | Admit: 2019-06-18 | Discharge: 2019-06-20 | DRG: 175 | Disposition: A | Discharge status: Home / Self Care | Payer: 59 | Attending: Internal Medicine | Admitting: Internal Medicine

## 2019-06-18 ENCOUNTER — Other Ambulatory Visit: Payer: Self-pay

## 2019-06-18 ENCOUNTER — Emergency Department (HOSPITAL_COMMUNITY): Payer: 59

## 2019-06-18 DIAGNOSIS — I2699 Other pulmonary embolism without acute cor pulmonale: Secondary | ICD-10-CM | POA: Diagnosis present

## 2019-06-18 DIAGNOSIS — J479 Bronchiectasis, uncomplicated: Secondary | ICD-10-CM

## 2019-06-18 DIAGNOSIS — E6609 Other obesity due to excess calories: Secondary | ICD-10-CM

## 2019-06-18 DIAGNOSIS — D869 Sarcoidosis, unspecified: Secondary | ICD-10-CM | POA: Diagnosis present

## 2019-06-18 DIAGNOSIS — Z809 Family history of malignant neoplasm, unspecified: Secondary | ICD-10-CM

## 2019-06-18 DIAGNOSIS — Z87891 Personal history of nicotine dependence: Secondary | ICD-10-CM

## 2019-06-18 DIAGNOSIS — N179 Acute kidney failure, unspecified: Secondary | ICD-10-CM | POA: Diagnosis present

## 2019-06-18 DIAGNOSIS — N182 Chronic kidney disease, stage 2 (mild): Secondary | ICD-10-CM | POA: Diagnosis present

## 2019-06-18 DIAGNOSIS — R0602 Shortness of breath: Secondary | ICD-10-CM | POA: Diagnosis not present

## 2019-06-18 DIAGNOSIS — Z8249 Family history of ischemic heart disease and other diseases of the circulatory system: Secondary | ICD-10-CM

## 2019-06-18 DIAGNOSIS — Z6837 Body mass index (BMI) 37.0-37.9, adult: Secondary | ICD-10-CM

## 2019-06-18 DIAGNOSIS — Z20822 Contact with and (suspected) exposure to covid-19: Secondary | ICD-10-CM | POA: Diagnosis present

## 2019-06-18 DIAGNOSIS — I129 Hypertensive chronic kidney disease with stage 1 through stage 4 chronic kidney disease, or unspecified chronic kidney disease: Secondary | ICD-10-CM | POA: Diagnosis present

## 2019-06-18 DIAGNOSIS — E669 Obesity, unspecified: Secondary | ICD-10-CM

## 2019-06-18 DIAGNOSIS — R42 Dizziness and giddiness: Secondary | ICD-10-CM

## 2019-06-18 DIAGNOSIS — I2694 Multiple subsegmental pulmonary emboli without acute cor pulmonale: Principal | ICD-10-CM | POA: Diagnosis present

## 2019-06-18 DIAGNOSIS — N189 Chronic kidney disease, unspecified: Secondary | ICD-10-CM | POA: Diagnosis present

## 2019-06-18 DIAGNOSIS — E785 Hyperlipidemia, unspecified: Secondary | ICD-10-CM | POA: Diagnosis present

## 2019-06-18 DIAGNOSIS — R042 Hemoptysis: Secondary | ICD-10-CM | POA: Diagnosis present

## 2019-06-18 DIAGNOSIS — J9601 Acute respiratory failure with hypoxia: Secondary | ICD-10-CM | POA: Diagnosis present

## 2019-06-18 HISTORY — DX: Bronchiectasis, uncomplicated: J47.9

## 2019-06-18 HISTORY — DX: Chronic kidney disease, unspecified: N18.9

## 2019-06-18 LAB — I-STAT BETA HCG BLOOD, ED (MC, WL, AP ONLY): I-stat hCG, quantitative: <5 m[IU]/mL (ref <5)

## 2019-06-18 LAB — TROPONIN I (HIGH SENSITIVITY)
Troponin I (High Sensitivity): 27 ng/L — ABNORMAL HIGH (ref <18)
Troponin I (High Sensitivity): 28 ng/L — ABNORMAL HIGH (ref <18)

## 2019-06-18 LAB — CBC
HCT: 42 % (ref 36.0–46.0)
Hemoglobin: 13 g/dL (ref 12.0–15.0)
MCH: 26.8 pg (ref 26.0–34.0)
MCHC: 31 g/dL (ref 30.0–36.0)
MCV: 86.6 fL (ref 80.0–100.0)
Platelets: 339 10*3/uL (ref 150–400)
RBC: 4.85 MIL/uL (ref 3.87–5.11)
RDW: 13.8 % (ref 11.5–15.5)
WBC: 11 10*3/uL — ABNORMAL HIGH (ref 4.0–10.5)
nRBC: 0 % (ref 0.0–0.2)

## 2019-06-18 LAB — BASIC METABOLIC PANEL
Anion gap: 9 (ref 5–15)
BUN: 10 mg/dL (ref 6–20)
CO2: 22 mmol/L (ref 22–32)
Calcium: 8.7 mg/dL — ABNORMAL LOW (ref 8.9–10.3)
Chloride: 107 mmol/L (ref 98–111)
Creatinine, Ser: 1.23 mg/dL — ABNORMAL HIGH (ref 0.44–1.00)
GFR calc Af Amer: 60 mL/min — ABNORMAL LOW (ref >60)
GFR calc non Af Amer: 51 mL/min — ABNORMAL LOW (ref >60)
Glucose, Bld: 138 mg/dL — ABNORMAL HIGH (ref 70–99)
Potassium: 3.8 mmol/L (ref 3.5–5.1)
Sodium: 138 mmol/L (ref 135–145)

## 2019-06-18 LAB — HIV ANTIBODY (ROUTINE TESTING W REFLEX): HIV Screen 4th Generation wRfx: NONREACTIVE

## 2019-06-18 MED ORDER — ONDANSETRON HCL 4 MG PO TABS: 4.0000 mg | ORAL_TABLET | Freq: Four times a day (QID) | ORAL | Status: DC | PRN

## 2019-06-18 MED ORDER — ALBUTEROL SULFATE (2.5 MG/3ML) 0.083% IN NEBU: 2.5000 mg | INHALATION_SOLUTION | Freq: Four times a day (QID) | RESPIRATORY_TRACT | Status: DC | PRN

## 2019-06-18 MED ORDER — ACETAMINOPHEN 650 MG RE SUPP: 650.0000 mg | Freq: Four times a day (QID) | RECTAL | Status: DC | PRN

## 2019-06-18 MED ORDER — HEPARIN BOLUS VIA INFUSION
5500.0000 [IU] | Freq: Once | INTRAVENOUS | Status: AC
  Administered 2019-06-18: 5500 [IU] via INTRAVENOUS
  Filled 2019-06-18: qty 5500

## 2019-06-18 MED ORDER — ACETAMINOPHEN 325 MG PO TABS
650.0000 mg | ORAL_TABLET | Freq: Four times a day (QID) | ORAL | Status: DC | PRN
  Administered 2019-06-20: 650 mg via ORAL
  Filled 2019-06-18: qty 2

## 2019-06-18 MED ORDER — ROSUVASTATIN CALCIUM 5 MG PO TABS
5.0000 mg | ORAL_TABLET | Freq: Every day | ORAL | Status: DC
  Administered 2019-06-18 – 2019-06-20 (×3): 5 mg via ORAL
  Filled 2019-06-18 (×3): qty 1

## 2019-06-18 MED ORDER — SODIUM CHLORIDE 0.9% FLUSH
3.0000 mL | Freq: Two times a day (BID) | INTRAVENOUS | Status: DC
  Administered 2019-06-19 – 2019-06-20 (×2): 3 mL via INTRAVENOUS

## 2019-06-18 MED ORDER — HEPARIN (PORCINE) 25000 UT/250ML-% IV SOLN
1050.0000 [IU]/h | INTRAVENOUS | Status: DC
  Administered 2019-06-18: 1350 [IU]/h via INTRAVENOUS
  Administered 2019-06-19: 1050 [IU]/h via INTRAVENOUS
  Filled 2019-06-18 (×2): qty 250

## 2019-06-18 MED ORDER — IOHEXOL 350 MG/ML SOLN
80.0000 mL | Freq: Once | INTRAVENOUS | Status: AC | PRN
  Administered 2019-06-18: 80 mL via INTRAVENOUS

## 2019-06-18 MED ORDER — ONDANSETRON HCL 4 MG/2ML IJ SOLN: 4.0000 mg | Freq: Four times a day (QID) | INTRAMUSCULAR | Status: DC | PRN

## 2019-06-18 NOTE — ED Triage Notes (Signed)
Pt endorses lightheadedness for a few weeks. Hx of sarcoidosis but coughed up blood x3 today. Intermittent chest tightness, none at this time.

## 2019-06-18 NOTE — ED Provider Notes (Signed)
[facility] EMERGENCY DEPARTMENT Provider Note   CSN: [id number] Arrival date & time: 06/18/19  1502     History Chief Complaint  Patient presents with  . Dizziness    Hailey Ross is a 49 y.o. female who presents with lightheadedness.  Patient states that she has had lightheadedness on and off for approximately 1 month.  She is seeing her doctor for this who did an EKG and other tests which were all normal.  The lightheadedness will occur at random and is not associated with with change in positions.  She called her doctor and told her that it was still going on and she was told to go to urgent care which she did.  They did not find anything wrong either.  She was started on meclizine and states this has not improved her symptoms.  She is also been having intermittent blurry vision.  She just had her eyes checked and was told that her eyes were fine.  She is also been having intermittent nosebleeds for the past couple of days that will occur at random.  She coughed up a blood clot and some streaks of blood today which concerned her is why she came to the ED.  This did occur after a nosebleed so she is unsure if it is related to that or not.  She does have been having some intermittent chest tightness but this is a chronic problem and she also has chronic cough.  She is followed by pulmonology and had a full checkup in January which was normal.  She also states she has been having some right-sided calf pain for some time.  She denies fever, headache, syncope, unilateral weakness, vision loss, shortness of breath, abdominal pain, nausea, vomiting, leg swelling.  She denies history of DVT or PE.  She does not have a history of hypertension.  HPI     Past Medical History:  Diagnosis Date  . Acute cholecystitis 08/24/2016  . Sarcoidosis   . Smoker 02/12/2014    Patient Active Problem List   Diagnosis Date Noted  . Vertigo 05/10/2019  . Vitamin D deficiency 05/10/2019   . Class 2 obesity due to excess calories with body mass index (BMI) of 37.0 to 37.9 in adult 04/16/2019  . Annual visit for general adult medical examination with abnormal findings 04/16/2019  . Pre-syncope 04/16/2019  . Sarcoidosis 05/04/2015  . Bronchiectasis (HCC) 05/04/2015    Past Surgical History:  Procedure Laterality Date  . CHOLECYSTECTOMY N/A 08/25/2016   Procedure: LAPAROSCOPIC CHOLECYSTECTOMY;  Surgeon: Abigail Miyamoto, MD;  Location: [facility] OR;  Service: General;  Laterality: N/A;  . TUBAL LIGATION  02/1993     OB History   No obstetric history on file.     Family History  Problem Relation Age of Onset  . Hypertension Mother   . Cancer Mother     Social History   Tobacco Use  . Smoking status: Former Smoker    Packs/day: 1.00    Years: 20.00    Pack years: 20.00    Types: Cigarettes    Quit date: 03/21/2015    Years since quitting: 4.2  . Smokeless tobacco: Never Used  Substance Use Topics  . Alcohol use: Yes    Alcohol/week: 0.0 standard drinks    Comment: Rare  . Drug use: No    Home Medications Prior to Admission medications   Medication Sig Start Date End Date Taking? Authorizing Provider  albuterol (PROAIR HFA) 108 (90 Base) MCG/ACT  inhaler Inhale 2 puffs into the lungs every 6 (six) hours as needed for wheezing or shortness of breath. 04/23/19   Coralyn Helling, MD  meclizine (ANTIVERT) 12.5 MG tablet Take 1 tablet (12.5 mg total) by mouth 2 (two) times daily as needed for dizziness. 05/09/19   Freddy Finner, NP  naproxen sodium (ALEVE) 220 MG tablet Take 220-440 mg by mouth daily as needed (for pain).    [provider]  Respiratory Therapy Supplies (FLUTTER) DEVI Use flutter valve 3 times a day 03/30/18   Glenford Bayley, NP  rosuvastatin (CRESTOR) 5 MG tablet Take 1 tablet (5 mg total) by mouth daily. 05/27/19   Freddy Finner, NP  Vitamin D, Ergocalciferol, (DRISDOL) 1.25 MG (50000 UNIT) CAPS capsule Take 1 capsule (50,000 Units total)  by mouth every 7 (seven) days. 04/28/19   Freddy Finner, NP    Allergies    Patient has no known allergies.  Review of Systems   Review of Systems  Constitutional: Negative for fever.  Eyes: Positive for visual disturbance. Negative for pain.  Respiratory: Positive for cough and chest tightness. Negative for shortness of breath.        +hemoptysis  Cardiovascular: Negative for chest pain, palpitations and leg swelling.  Gastrointestinal: Negative for abdominal pain, diarrhea, nausea and vomiting.  Musculoskeletal: Positive for myalgias.  Neurological: Positive for light-headedness. Negative for dizziness, syncope, weakness and headaches.  All other systems reviewed and are negative.   Physical Exam Updated Vital Signs BP (!) 143/98   Pulse 78   Temp 98.1 F (36.7 C) (Oral)   Resp (!) 21   Ht 5\' 5"  (1.651 m)   Wt 100.7 kg   SpO2 92%   BMI 36.94 kg/m   Physical Exam Vitals and nursing note reviewed.  Constitutional:      General: She is not in acute distress.    Appearance: Normal appearance. She is well-developed. She is not ill-appearing.     Comments: Calm and cooperative. NAD  HENT:     Head: Normocephalic and atraumatic.     Nose: Nose normal.  Eyes:     General: No scleral icterus.       Right eye: No discharge.        Left eye: No discharge.     Conjunctiva/sclera: Conjunctivae normal.     Pupils: Pupils are equal, round, and reactive to light.  Cardiovascular:     Rate and Rhythm: Normal rate and regular rhythm.  Pulmonary:     Effort: Pulmonary effort is normal. No respiratory distress.     Breath sounds: Normal breath sounds.  Abdominal:     General: There is no distension.     Palpations: Abdomen is soft.     Tenderness: There is no abdominal tenderness.  Musculoskeletal:     Cervical back: Normal range of motion.     Right lower leg: No edema.     Left lower leg: No edema.  Skin:    General: Skin is warm and dry.  Neurological:     Mental  Status: She is alert and oriented to person, place, and time.  Psychiatric:        Behavior: Behavior normal.     ED Results / Procedures / Treatments   Labs (all labs ordered are listed, but only abnormal results are displayed) Labs Reviewed  BASIC METABOLIC PANEL - Abnormal; Notable for the following components:      Result Value   Glucose, Bld 138 (*)  Creatinine, Ser 1.23 (*)    Calcium 8.7 (*)    GFR calc non Af Amer 51 (*)    GFR calc Af Amer 60 (*)    All other components within normal limits  CBC - Abnormal; Notable for the following components:   WBC 11.0 (*)    All other components within normal limits  TROPONIN I (HIGH SENSITIVITY) - Abnormal; Notable for the following components:   Troponin I (High Sensitivity) 27 (*)    All other components within normal limits  TROPONIN I (HIGH SENSITIVITY) - Abnormal; Notable for the following components:   Troponin I (High Sensitivity) 28 (*)    All other components within normal limits  SARS CORONAVIRUS 2 (TAT 6-24 HRS)  I-STAT BETA HCG BLOOD, ED ([facility], [facility], [facility] ONLY)    EKG None  Radiology DG Chest 2 View  Result Date: 06/18/2019 CLINICAL DATA:  Chest pain and dizziness EXAM: CHEST - 2 VIEW COMPARISON:  04/22/2018 FINDINGS: Cardiac shadow is stable. Aortic calcifications are again seen. Interstitial prominence and scattered reticulonodular opacities are again seen and stable consistent with the given clinical history of sarcoidosis. No focal infiltrate or sizable effusion is seen. No bony abnormality is noted. IMPRESSION: Chronic changes consistent with the given clinical history of sarcoidosis stable from the prior exam. Electronically Signed   By: Alcide Clever M.D.   On: 06/18/2019 15:44   CT Angio Chest PE W and/or Wo Contrast  Result Date: 06/18/2019 CLINICAL DATA:  Dyspnea. Hemoptysis the past 3 days. Sarcoidosis. EXAM: CT ANGIOGRAPHY CHEST WITH CONTRAST TECHNIQUE: Multidetector CT imaging of the chest was performed using  the standard protocol during bolus administration of intravenous contrast. Multiplanar CT image reconstructions and MIPs were obtained to evaluate the vascular anatomy. CONTRAST:  40mL OMNIPAQUE IOHEXOL 350 MG/ML SOLN COMPARISON:  None. Unenhanced CT chest high-resolution dated March 06, 2018. FINDINGS: Cardiovascular: Peripheral segmental and subsegmental bilateral pulmonary emboli, including in the right apex and lung bases. No central or lobar pulmonary embolism. No right heart strain. Borderline cardiomegaly. A 3.5 cm pulmonary trunk diameter. Paucity of coronary calcification. Mediastinum/Nodes: Bulky mediastinal and hilar noncalcified lymph nodes consistent with the history of sarcoidosis. Lungs/Pleura: No pleural effusion or pneumothorax. Similar bilateral apical and lingular honeycombing and bilateral upper lobe mild traction bronchiectasis. Similar mild septal thickening. Nonspecific ground-glass opacities with wedge-shaped distributions in the left base and right middle lobe, not significantly changed. Upper Abdomen: Mild hepatomegaly. Cholecystectomy. Musculoskeletal: Mild skeletal degenerative change. Review of the MIP images confirms the above findings. I discussed critical Value/emergent results by telephone at the time of interpretation on 06/18/2019 at 7:52 pm with provider Terance Hart, who verbally acknowledged these results. IMPRESSION: 1. Peripheral segmental and subsegmental bilateral pulmonary emboli. No right heart strain. 2. Bulky mediastinal and hilar lymph nodes and pulmonary findings consistent with the history of sarcoidosis, possibly with an acute inflammatory or infectious exacerbation. 3. Pulmonary artery hypertension and borderline cardiomegaly. 4. Mild hepatomegaly. Electronically Signed   By: Laurence Ferrari   On: 06/18/2019 19:55    Procedures Procedures (including critical care time)  CRITICAL CARE Performed by: Bethel Born   Total critical care time: 35 minutes   Critical care time was exclusive of separately billable procedures and treating other patients.  Critical care was necessary to treat or prevent imminent or life-threatening deterioration.  Critical care was time spent personally by me on the following activities: development of treatment plan with patient and/or surrogate as well as nursing, discussions with consultants, evaluation  of patient's response to treatment, examination of patient, obtaining history from patient or surrogate, ordering and performing treatments and interventions, ordering and review of laboratory studies, ordering and review of radiographic studies, pulse oximetry and re-evaluation of patient's condition.   Medications Ordered in ED Medications  iohexol (OMNIPAQUE) 350 MG/ML injection 80 mL (80 mLs Intravenous Contrast Given 06/18/19 1911)    ED Course  I have reviewed the triage vital signs and the nursing notes.  Pertinent labs & imaging results that were available during my care of the patient were reviewed by me and considered in my medical decision making (see chart for details).  49 year old female with multiple complaints. She has a constellation of symptoms that have been ongoing for some time. She states it's been difficult because she has a lot of chronic overlapping symptoms with her sarcoid. BP is minimally elevated here. Heart is regular rate and rhythm. EKG is NSR. Lungs are CTA. Sats are low normal ~92%. CXR shows chronic changes from sarcoid but is otherwise negative. Abdomen is soft and non-tender. Her neurologic exam is normal. She has mild tenderness of the R calf but is N/V intact. Labs show mild leukocytosis (11), mild CKD (SCr 1.2). Trop is minimally elevated to 27. I think she is high risk for PE. Will obtain CTA chest.  7:51 PM CT shows bilateral peripheral segmental and subsegmental PE. No right heart strain. Will start on heparin and obtain COVID test. Shared visit with Dr. Colvin Caroli.  Discussed  with Dr. Posey Pronto with [facility] who will admit.  MDM Rules/Calculators/A&P                      Final Clinical Impression(s) / ED Diagnoses Final diagnoses:  Multiple subsegmental pulmonary emboli without acute cor pulmon[facility])    Rx / DC Orders ED Discharge Orders    None       Recardo Evangelist, PA-C 06/18/19 2018    Charlesetta Shanks, MD 06/18/19 2357

## 2019-06-18 NOTE — H&P (Signed)
History and Physical    Hailey Ross NAT:[id number] DOB: 1970/10/23 DOA: 06/18/2019  PCP: Freddy Finner, NP  Patient coming from: Home  I have personally briefly reviewed patient's old medical records in [facility]  Chief Complaint: Cough  HPI: Hailey Ross is a 49 y.o. female with medical history significant for sarcoidosis with bronchiectasis and hyperlipidemia who presents to the ED for evaluation of chest tightness and cough productive of bloody sputum.  Patient reports about 1 month of intermittent lightheadedness which occurs with or without activity.  She was seen at urgent care and by her PCP without obvious etiology.  She has not lost consciousness or fallen from these episodes.  She says she has a chronic cough due to her sarcoidosis usually productive of mucus.  Today while at work she coughed up a small blood clot and then had a couple more episodes of spotty blood in her sputum.  She also reports recent trickling nosebleeds.  She denies any other obvious bleeding except for her current menses which is unchanged from regular.  She has had intermittent central chest discomfort without radiation or associated with activity.  She denies any shortness of breath, nausea, vomiting, abdominal pain, dysuria.  She does report recent pain in the back of her right lower extremity at the calf and behind the knee.  She denies any personal history of previous blood clots or any family history of blood clots as far she knows.  She is a former smoker and quit smoking 5 years ago.  She has not had any recent surgeries.  ED Course:  Initial vitals showed BP 145/94, pulse 87, RR 16, temp 98.1 Fahrenheit, SPO2 96% on room air.  Patient noted to desaturate to 77% on room air after ambulating to the bathroom, improved to 91% after several minutes of rest.  Labs show WBC 11.0, hemoglobin 13.0, platelets 339,000, sodium 138, potassium 3.8, bicarb 22, BUN 10, creatinine 1.23,  high-sensitivity troponin I 27 >> 28, i-STAT beta-hCG undetectable.  SARS-CoV-2 PCR test is obtained and pending.  2 view chest x-ray shows chronic interstitial scattered reticulonodular opacities stable from prior exam.  CTA chest PE study shows peripheral segmental and subsegmental bilateral pulmonary emboli without evidence of right heart strain.  Bulky mediastinal and hilar lymph nodes are noted.    Patient was started on IV heparin and the hospitalist service was consulted to admit for further evaluation and management.  Review of Systems: All systems reviewed and are negative except as documented in history of present illness above.   Past Medical History:  Diagnosis Date  . Acute cholecystitis 08/24/2016  . Sarcoidosis   . Smoker 02/12/2014    Past Surgical History:  Procedure Laterality Date  . CHOLECYSTECTOMY N/A 08/25/2016   Procedure: LAPAROSCOPIC CHOLECYSTECTOMY;  Surgeon: Abigail Miyamoto, MD;  Location: [facility] OR;  Service: General;  Laterality: N/A;  . TUBAL LIGATION  02/1993    Social History:  reports that she quit smoking about 4 years ago. Her smoking use included cigarettes. She has a 20.00 pack-year smoking history. She has never used smokeless tobacco. She reports current alcohol use. She reports that she does not use drugs.  No Known Allergies  Family History  Problem Relation Age of Onset  . Hypertension Mother   . Cancer Mother      Prior to Admission medications   Medication Sig Start Date End Date Taking? Authorizing Provider  albuterol (PROAIR HFA) 108 (90 Base) MCG/ACT inhaler Inhale 2 puffs into  the lungs every 6 (six) hours as needed for wheezing or shortness of breath. 04/23/19   Chesley Mires, MD  meclizine (ANTIVERT) 12.5 MG tablet Take 1 tablet (12.5 mg total) by mouth 2 (two) times daily as needed for dizziness. 05/09/19   Perlie Mayo, NP  naproxen sodium (ALEVE) 220 MG tablet Take 220-440 mg by mouth daily as needed (for pain).    [provider]  Respiratory Therapy Supplies (FLUTTER) DEVI Use flutter valve 3 times a day 03/30/18   Martyn Ehrich, NP  rosuvastatin (CRESTOR) 5 MG tablet Take 1 tablet (5 mg total) by mouth daily. 05/27/19   Perlie Mayo, NP  Vitamin D, Ergocalciferol, (DRISDOL) 1.25 MG (50000 UNIT) CAPS capsule Take 1 capsule (50,000 Units total) by mouth every 7 (seven) days. 04/28/19   Perlie Mayo, NP    Physical Exam: Vitals:   06/18/19 1809 06/18/19 1815 06/18/19 1830 06/18/19 1845  BP:  (!) 147/96 (!) 145/100 (!) 143/98  Pulse:  (!) 57 72 78  Resp:  15 16 (!) 21  Temp:      TempSrc:      SpO2:  94% 92% 92%  Weight: 100.7 kg     Height: 5\' 5"  (1.651 m)      Constitutional: Sitting up in bed, NAD, calm, comfortable Eyes: PERRL, lids and conjunctivae normal ENMT: Mucous membranes are moist. Posterior pharynx clear of any exudate or lesions.Normal dentition.  Neck: normal, supple, no masses. Respiratory: clear to auscultation bilaterally, no wheezing, no crackles. Normal respiratory effort. No accessory muscle use.  Cardiovascular: Regular rate and rhythm, no murmurs / rubs / gallops. No extremity edema. 2+ pedal pulses. Abdomen: no tenderness, no masses palpated. No hepatosplenomegaly. Bowel sounds positive.  Musculoskeletal: no clubbing / cyanosis. No joint deformity upper and lower extremities. Good ROM, no contractures. Normal muscle tone.  Skin: no rashes, lesions, ulcers. No induration Neurologic: CN 2-12 grossly intact. Sensation intact, Strength 5/5 in all 4.  Psychiatric: Normal judgment and insight. Alert and oriented x 3. Normal mood.     Labs on Admission: I have personally reviewed following labs and imaging studies  CBC: Recent Labs  Lab 06/18/19 1522  WBC 11.0*  HGB 13.0  HCT 42.0  MCV 86.6  PLT 431   Basic Metabolic Panel: Recent Labs  Lab 06/18/19 1522  NA 138  K 3.8  CL 107  CO2 22  GLUCOSE 138*  BUN 10  CREATININE 1.23*  CALCIUM 8.7*    GFR: Estimated Creatinine Clearance: 65.1 mL/min (A) (by C-G formula based on SCr of 1.23 mg/dL (H)). Liver Function Tests: No results for input(s): AST, ALT, ALKPHOS, BILITOT, PROT, ALBUMIN in the last 168 hours. No results for input(s): LIPASE, AMYLASE in the last 168 hours. No results for input(s): AMMONIA in the last 168 hours. Coagulation Profile: No results for input(s): INR, PROTIME in the last 168 hours. Cardiac Enzymes: No results for input(s): CKTOTAL, CKMB, CKMBINDEX, TROPONINI in the last 168 hours. BNP (last 3 results) No results for input(s): PROBNP in the last 8760 hours. HbA1C: No results for input(s): HGBA1C in the last 72 hours. CBG: No results for input(s): GLUCAP in the last 168 hours. Lipid Profile: No results for input(s): CHOL, HDL, LDLCALC, TRIG, CHOLHDL, LDLDIRECT in the last 72 hours. Thyroid Function Tests: No results for input(s): TSH, T4TOTAL, FREET4, T3FREE, THYROIDAB in the last 72 hours. Anemia Panel: No results for input(s): VITAMINB12, FOLATE, FERRITIN, TIBC, IRON, RETICCTPCT in the last 72 hours.  Urine analysis:    Component Value Date/Time   COLORURINE AMBER (A) 08/24/2016 1725   APPEARANCEUR CLOUDY (A) 08/24/2016 1725   LABSPEC 1.032 (H) 08/24/2016 1725   PHURINE 6.0 08/24/2016 1725   GLUCOSEU NEGATIVE 08/24/2016 1725   HGBUR MODERATE (A) 08/24/2016 1725   BILIRUBINUR SMALL (A) 08/24/2016 1725   KETONESUR 80 (A) 08/24/2016 1725   PROTEINUR 100 (A) 08/24/2016 1725   NITRITE NEGATIVE 08/24/2016 1725   LEUKOCYTESUR MODERATE (A) 08/24/2016 1725    Radiological Exams on Admission: DG Chest 2 View  Result Date: 06/18/2019 CLINICAL DATA:  Chest pain and dizziness EXAM: CHEST - 2 VIEW COMPARISON:  04/22/2018 FINDINGS: Cardiac shadow is stable. Aortic calcifications are again seen. Interstitial prominence and scattered reticulonodular opacities are again seen and stable consistent with the given clinical history of sarcoidosis. No focal  infiltrate or sizable effusion is seen. No bony abnormality is noted. IMPRESSION: Chronic changes consistent with the given clinical history of sarcoidosis stable from the prior exam. Electronically Signed   By: Alcide Clever M.D.   On: 06/18/2019 15:44   CT Angio Chest PE W and/or Wo Contrast  Result Date: 06/18/2019 CLINICAL DATA:  Dyspnea. Hemoptysis the past 3 days. Sarcoidosis. EXAM: CT ANGIOGRAPHY CHEST WITH CONTRAST TECHNIQUE: Multidetector CT imaging of the chest was performed using the standard protocol during bolus administration of intravenous contrast. Multiplanar CT image reconstructions and MIPs were obtained to evaluate the vascular anatomy. CONTRAST:  58mL OMNIPAQUE IOHEXOL 350 MG/ML SOLN COMPARISON:  None. Unenhanced CT chest high-resolution dated March 06, 2018. FINDINGS: Cardiovascular: Peripheral segmental and subsegmental bilateral pulmonary emboli, including in the right apex and lung bases. No central or lobar pulmonary embolism. No right heart strain. Borderline cardiomegaly. A 3.5 cm pulmonary trunk diameter. Paucity of coronary calcification. Mediastinum/Nodes: Bulky mediastinal and hilar noncalcified lymph nodes consistent with the history of sarcoidosis. Lungs/Pleura: No pleural effusion or pneumothorax. Similar bilateral apical and lingular honeycombing and bilateral upper lobe mild traction bronchiectasis. Similar mild septal thickening. Nonspecific ground-glass opacities with wedge-shaped distributions in the left base and right middle lobe, not significantly changed. Upper Abdomen: Mild hepatomegaly. Cholecystectomy. Musculoskeletal: Mild skeletal degenerative change. Review of the MIP images confirms the above findings. I discussed critical Value/emergent results by telephone at the time of interpretation on 06/18/2019 at 7:52 pm with provider Terance Hart, who verbally acknowledged these results. IMPRESSION: 1. Peripheral segmental and subsegmental bilateral pulmonary emboli. No  right heart strain. 2. Bulky mediastinal and hilar lymph nodes and pulmonary findings consistent with the history of sarcoidosis, possibly with an acute inflammatory or infectious exacerbation. 3. Pulmonary artery hypertension and borderline cardiomegaly. 4. Mild hepatomegaly. Electronically Signed   By: Laurence Ferrari   On: 06/18/2019 19:55    EKG: Independently reviewed. Sinus rhythm without acute ischemic changes.  Not significantly changed from prior.  Assessment/Plan Principal Problem:   Bilateral pulmonary embolism (HCC) Active Problems:   Sarcoidosis  Hailey Ross is a 49 y.o. female with medical history significant for sarcoidosis with bronchiectasis and hyperlipidemia who is admitted with bilateral pulmonary emboli.   Bilateral pulmonary emboli: Peripheral segmental and subsegmental bilateral pulmonary emboli seen on CTA chest PE study.  Possibly embolic from right lower extremity DVT.  Patient desaturated to 73% on room air with ambulation, likely some component from her underlying sarcoidosis. -Continue IV heparin, monitor closely due to reported recent nosebleeds -Can likely transition to oral anticoagulant tomorrow -Check echocardiogram -Follow-up right lower extremity DVT study -Supplemental oxygen as needed  Sarcoidosis with bronchiectasis:  Chronic and stable.  No longer on prednisone.  Continue flutter valve and as needed albuterol.  Hyperlipidemia: Continue rosuvastatin.  DVT prophylaxis: Heparin gtt Code Status: Full code, confirmed with patient Family Communication: Discussed with patient, she has discussed with family Disposition Plan: From home, likely discharge to home in 1-2 days pending echocardiogram and transition to oral anticoagulant Consults called: None Admission status: Observation   Darreld Mclean MD [facility]  If 7PM-7AM, please contact night-coverage [email or web address]  06/18/2019, 9:07 PM

## 2019-06-18 NOTE — Progress Notes (Signed)
ANTICOAGULATION CONSULT NOTE - Initial Consult  Pharmacy Consult for heparin Indication: pulmonary embolus  No Known Allergies  Patient Measurements: Height: 5\' 5"  (165.1 cm) Weight: 222 lb (100.7 kg) IBW/kg (Calculated) : 57 Heparin Dosing Weight: 80.1  Vital Signs: Temp: 98.1 F (36.7 C) (03/09 1510) Temp Source: Oral (03/09 1723) BP: 143/98 (03/09 1845) Pulse Rate: 78 (03/09 1845)  Labs: Recent Labs    06/18/19 1522 06/18/19 1800  HGB 13.0  --   HCT 42.0  --   PLT 339  --   CREATININE 1.23*  --   TROPONINIHS 27* 28*    Estimated Creatinine Clearance: 65.1 mL/min (A) (by C-G formula based on SCr of 1.23 mg/dL (H)).   Medical History: Past Medical History:  Diagnosis Date  . Acute cholecystitis 08/24/2016  . Sarcoidosis   . Smoker 02/12/2014    Medications:  Infusions:   Assessment: Ms 13/07/2013 is a 49 y/o F with noted peripheral segmental and subsegmental bilateral pulmonary emboli on CT imaging. No evidence of right heart strain. She was not on any anticoagulation PTA.  H&H and platelets are WNL. No active bleeding noted.  Goal of Therapy:  Heparin level 0.3-0.7 units/ml Monitor platelets by anticoagulation protocol: Yes   Plan:  Give 5500 units bolus x 1 Start heparin infusion at 1350 units/hr Check anti-Xa level in 6 hours and daily while on heparin Continue to monitor H&H and platelets  54, PharmD PGY1 Ambulatory Care Pharmacy Resident 06/18/2019,8:54 PM

## 2019-06-18 NOTE — ED Notes (Signed)
Pt ambulatory to restroom, returned to room with increased work of breathing, oxygen sats at 77%on RA (pulse ox on L index finger with good pleath). Pt reports sob at baseline for her after ambulation. Work of breathing improved after resting for several minutes, sats at 91% on room air.

## 2019-06-19 ENCOUNTER — Observation Stay (HOSPITAL_COMMUNITY): Payer: 59

## 2019-06-19 ENCOUNTER — Telehealth: Payer: Self-pay | Admitting: Podiatry

## 2019-06-19 ENCOUNTER — Encounter (HOSPITAL_COMMUNITY): Payer: Self-pay | Admitting: Internal Medicine

## 2019-06-19 ENCOUNTER — Inpatient Hospital Stay (HOSPITAL_COMMUNITY): Payer: 59

## 2019-06-19 DIAGNOSIS — J9601 Acute respiratory failure with hypoxia: Secondary | ICD-10-CM

## 2019-06-19 DIAGNOSIS — R0602 Shortness of breath: Secondary | ICD-10-CM | POA: Diagnosis present

## 2019-06-19 DIAGNOSIS — R609 Edema, unspecified: Secondary | ICD-10-CM

## 2019-06-19 DIAGNOSIS — E785 Hyperlipidemia, unspecified: Secondary | ICD-10-CM | POA: Diagnosis present

## 2019-06-19 DIAGNOSIS — Z20822 Contact with and (suspected) exposure to covid-19: Secondary | ICD-10-CM | POA: Diagnosis present

## 2019-06-19 DIAGNOSIS — Z87891 Personal history of nicotine dependence: Secondary | ICD-10-CM | POA: Diagnosis not present

## 2019-06-19 DIAGNOSIS — J479 Bronchiectasis, uncomplicated: Secondary | ICD-10-CM | POA: Diagnosis present

## 2019-06-19 DIAGNOSIS — I2602 Saddle embolus of pulmonary artery with acute cor pulmonale: Secondary | ICD-10-CM | POA: Diagnosis not present

## 2019-06-19 DIAGNOSIS — N179 Acute kidney failure, unspecified: Secondary | ICD-10-CM | POA: Diagnosis present

## 2019-06-19 DIAGNOSIS — D869 Sarcoidosis, unspecified: Secondary | ICD-10-CM | POA: Diagnosis present

## 2019-06-19 DIAGNOSIS — R042 Hemoptysis: Secondary | ICD-10-CM | POA: Diagnosis present

## 2019-06-19 DIAGNOSIS — Z6837 Body mass index (BMI) 37.0-37.9, adult: Secondary | ICD-10-CM | POA: Diagnosis not present

## 2019-06-19 DIAGNOSIS — Z809 Family history of malignant neoplasm, unspecified: Secondary | ICD-10-CM | POA: Diagnosis not present

## 2019-06-19 DIAGNOSIS — I2694 Multiple subsegmental pulmonary emboli without acute cor pulmonale: Secondary | ICD-10-CM | POA: Diagnosis present

## 2019-06-19 DIAGNOSIS — I129 Hypertensive chronic kidney disease with stage 1 through stage 4 chronic kidney disease, or unspecified chronic kidney disease: Secondary | ICD-10-CM | POA: Diagnosis present

## 2019-06-19 DIAGNOSIS — N182 Chronic kidney disease, stage 2 (mild): Secondary | ICD-10-CM | POA: Diagnosis present

## 2019-06-19 DIAGNOSIS — Z8249 Family history of ischemic heart disease and other diseases of the circulatory system: Secondary | ICD-10-CM | POA: Diagnosis not present

## 2019-06-19 HISTORY — DX: Acute respiratory failure with hypoxia: J96.01

## 2019-06-19 LAB — CBC
HCT: 41.4 % (ref 36.0–46.0)
Hemoglobin: 12.8 g/dL (ref 12.0–15.0)
MCH: 27 pg (ref 26.0–34.0)
MCHC: 30.9 g/dL (ref 30.0–36.0)
MCV: 87.3 fL (ref 80.0–100.0)
Platelets: 315 10*3/uL (ref 150–400)
RBC: 4.74 MIL/uL (ref 3.87–5.11)
RDW: 13.7 % (ref 11.5–15.5)
WBC: 11.1 10*3/uL — ABNORMAL HIGH (ref 4.0–10.5)
nRBC: 0 % (ref 0.0–0.2)

## 2019-06-19 LAB — BASIC METABOLIC PANEL
Anion gap: 8 (ref 5–15)
BUN: 9 mg/dL (ref 6–20)
CO2: 23 mmol/L (ref 22–32)
Calcium: 8.9 mg/dL (ref 8.9–10.3)
Chloride: 107 mmol/L (ref 98–111)
Creatinine, Ser: 1.19 mg/dL — ABNORMAL HIGH (ref 0.44–1.00)
GFR calc Af Amer: >60 mL/min (ref >60)
GFR calc non Af Amer: 54 mL/min — ABNORMAL LOW (ref >60)
Glucose, Bld: 93 mg/dL (ref 70–99)
Potassium: 3.8 mmol/L (ref 3.5–5.1)
Sodium: 138 mmol/L (ref 135–145)

## 2019-06-19 LAB — ECHOCARDIOGRAM COMPLETE
Height: 65 in
Weight: 3552 oz

## 2019-06-19 LAB — HEPARIN LEVEL (UNFRACTIONATED)
Heparin Unfractionated: 1.02 IU/mL — ABNORMAL HIGH (ref 0.30–0.70)
Heparin Unfractionated: 0.89 IU/mL — ABNORMAL HIGH (ref 0.30–0.70)
Heparin Unfractionated: 0.7 IU/mL (ref 0.30–0.70)

## 2019-06-19 LAB — SARS CORONAVIRUS 2 (TAT 6-24 HRS): SARS Coronavirus 2: NEGATIVE

## 2019-06-19 MED ORDER — HYDRALAZINE HCL 10 MG PO TABS
10.0000 mg | ORAL_TABLET | Freq: Four times a day (QID) | ORAL | Status: DC | PRN
  Filled 2019-06-19: qty 1

## 2019-06-19 NOTE — Progress Notes (Signed)
ANTICOAGULATION CONSULT NOTE   Pharmacy Consult for Heparin Indication: pulmonary embolus  No Known Allergies  Patient Measurements: Height: 5\' 5"  (165.1 cm) Weight: 220 lb 3.8 oz (99.9 kg) IBW/kg (Calculated) : 57 Heparin Dosing Weight: 80.1 kg  Vital Signs: Temp: 97.9 F (36.6 C) (03/10 1654) Temp Source: Oral (03/10 1654) BP: 171/95 (03/10 1654) Pulse Rate: 62 (03/10 1654)  Labs: Recent Labs    06/18/19 1522 06/18/19 1800 06/19/19 0227 06/19/19 1033 06/19/19 1645  HGB 13.0  --  12.8  --   --   HCT 42.0  --  41.4  --   --   PLT 339  --  315  --   --   HEPARINUNFRC  --   --  1.02* 0.89* 0.70  CREATININE 1.23*  --  1.19*  --   --   TROPONINIHS 27* 28*  --   --   --     Estimated Creatinine Clearance: 67 mL/min (A) (by C-G formula based on SCr of 1.19 mg/dL (H)).   Medical History: Past Medical History:  Diagnosis Date  . Acute cholecystitis 08/24/2016  . Bronchiectasis (HCC)   . Sarcoidosis   . Smoker 02/12/2014     Assessment: Ms 13/07/2013 is a 49 yr old female with noted peripheral segmental and subsegmental bilateral pulmonary emboli on CT imaging. No evidence of right heart strain. She was not on any anticoagulation PTA.  Heparin level ~5 hrs after heparin infusion was decreased to 1050 units/hr was 0.70 units/ml, which is at the upper end of the goal range for this pt. CBC WNL. Per RN, no issues with IV or bleeding observed.  Goal of Therapy:  Heparin level 0.3-0.7 units/ml Monitor platelets by anticoagulation protocol: Yes   Plan:  Continue heparin infusion at 1050 units/hr Check confirmatory heparin level in 6 hrs Check daily heparin level, CBC Monitor signs/symptoms of bleeding  54, PharmD, BCPS, [facility] Clinical Pharmacist 06/19/2019 5:51 PM

## 2019-06-19 NOTE — CV Procedure (Signed)
2D echo attempted, but patient was in transit to room. Will try later.

## 2019-06-19 NOTE — ED Notes (Signed)
Pt ambulated to RR and back to room 

## 2019-06-19 NOTE — Progress Notes (Addendum)
Progress Note    Hailey Ross  PJK:[id number] DOB: [birth year]  DOA: 06/18/2019 PCP: Freddy Finner, NP    Brief Narrative:   Chief complaint: worsening cough with hemoptysis   Medical records reviewed and are as summarized below:  Hailey Ross is an very pleasant 49 y.o. female with a past medical history significant for sarcoidosis with bronchiectasis, hyperlipidemia, obesity is emergency department chief complaint of worsening cough with hemoptysis intermittent chest pain.  Work-up in the emergency department reveals bilateral pulmonary embolus with no obvious heart strain on CT.  Patient also ambulated to the bathroom while in the emergency department and oxygen saturation level dropped to 77% on room air.  Heparin drip was initiated  Assessment/Plan:   Principal Problem:   Acute respiratory failure with hypoxia (HCC) Active Problems:   Lightheadedness   Bilateral pulmonary embolism (HCC)   Hemoptysis   Sarcoidosis   Bronchiectasis (HCC)   Class 2 obesity due to excess calories with body mass index (BMI) of 37.0 to 37.9 in adult  #1.  Acute respiratory failure with hypoxia likely related to bilateral pulmonary embolism in setting of sarcoidosis with bronchiectasis. Oxygen saturation level dropped to 77% on room air with ambulation.  She reports some worsening shortness of breath with activity.  Oxygen saturation level improved to 91% after several minutes on room air with rest.  Patient is not oxygen dependent at baseline and has demonstrated increased oxygen demand -Oxygen supplementation as indicated -Wean oxygen as able -Continue home inhalers -Flutter valve  #2.  Bilateral pulmonary embolism.  She does state that she has been more sedentary last several months in her job.  She developed pain in her lower right leg about a month ago.  He is a former smoker no history of blood clots.  No recent surgery.  She reports she is "always short of breath" did not notice  a worsening.  Developed hemoptysis yesterday.  She also noted worsening "lightheadedness".  Heparin drip was initiated -We will transition to p.o. -Follow echocardiogram -Follow Doppler on right lower extremity -Monitor for bleeding as she had some nosebleeds and hemoptysis prior to admission  #3.  Sarcoidosis with bronchiectasis.  Chronic and stable according to the patient.  No longer on prednisone.  Home medications include an inhaler.  CT notes findings consistent with history of sarcoidosis possibly with an acute inflammatory or infectious exacerbation.  She is afebrile hemodynamically stable -Continue home inhalers -See #1  #4.  Hyperlipidemia. -Continue home meds  #5.  Hypertension.  Blood pressure high end of normal.  No history of same. -Monitor closely -As needed hydralazine  #6.  Obesity.  BMI 36.9.  Counseled regarding weight loss  Family Communication/Anticipated D/C date and plan/Code Status   DVT prophylaxis: heparin gtt Code Status: Full Code.  Family Communication: patient at bedside Disposition Plan: home hopefully tomorrow   Medical Consultants:    None.   Anti-Infectives:    None  Subjective:   Awake alert sitting up in bed talking on telephone.  No acute distress  Objective:    Vitals:   06/19/19 0600 06/19/19 0700 06/19/19 0944 06/19/19 1000  BP: 130/80 132/84 (!) 147/97 (!) 144/93  Pulse: 69 (!) 54 72 71  Resp: (!) 28 (!) 22 17 (!) 23  Temp:      TempSrc:      SpO2: 98% 99% 92% 91%  Weight:      Height:       No intake or output data  in the 24 hours ending 06/19/19 1151 Filed Weights   06/18/19 1809  Weight: 100.7 kg    Exam: General: Obese, cooperative very pleasant CV: Regular rate and rhythm no murmur gallop or rub no lower extremity edema Respiratory: No increased work of breathing with conversation but she does get short of breath when repositioning herself in the bed.  Breath sounds are distant but clear.  No  wheeze Musculoskeletal joints without swelling/erythema mild tenderness behind right knee.  No swelling no erythema. Neuro: Awake alert oriented x3 speech clear facial symmetry  Data Reviewed:   I have personally reviewed following labs and imaging studies:  Labs: Labs show the following:   Basic Metabolic Panel: Recent Labs  Lab 06/18/19 1522 06/19/19 0227  NA 138 138  K 3.8 3.8  CL 107 107  CO2 22 23  GLUCOSE 138* 93  BUN 10 9  CREATININE 1.23* 1.19*  CALCIUM 8.7* 8.9   GFR Estimated Creatinine Clearance: 67.3 mL/min (A) (by C-G formula based on SCr of 1.19 mg/dL (H)). Liver Function Tests: No results for input(s): AST, ALT, ALKPHOS, BILITOT, PROT, ALBUMIN in the last 168 hours. No results for input(s): LIPASE, AMYLASE in the last 168 hours. No results for input(s): AMMONIA in the last 168 hours. Coagulation profile No results for input(s): INR, PROTIME in the last 168 hours.  CBC: Recent Labs  Lab 06/18/19 1522 06/19/19 0227  WBC 11.0* 11.1*  HGB 13.0 12.8  HCT 42.0 41.4  MCV 86.6 87.3  PLT 339 315   Cardiac Enzymes: No results for input(s): CKTOTAL, CKMB, CKMBINDEX, TROPONINI in the last 168 hours. BNP (last 3 results) No results for input(s): PROBNP in the last 8760 hours. CBG: No results for input(s): GLUCAP in the last 168 hours. D-Dimer: No results for input(s): DDIMER in the last 72 hours. Hgb A1c: No results for input(s): HGBA1C in the last 72 hours. Lipid Profile: No results for input(s): CHOL, HDL, LDLCALC, TRIG, CHOLHDL, LDLDIRECT in the last 72 hours. Thyroid function studies: No results for input(s): TSH, T4TOTAL, T3FREE, THYROIDAB in the last 72 hours.  Invalid input(s): FREET3 Anemia work up: No results for input(s): VITAMINB12, FOLATE, FERRITIN, TIBC, IRON, RETICCTPCT in the last 72 hours. Sepsis Labs: Recent Labs  Lab 06/18/19 1522 06/19/19 0227  WBC 11.0* 11.1*    Microbiology Recent Results (from the past 240 hour(s))  SARS  CORONAVIRUS 2 (TAT 6-24 HRS) Nasopharyngeal Nasopharyngeal Swab     Status: None   Collection Time: 06/18/19  8:15 PM   Specimen: Nasopharyngeal Swab  Result Value Ref Range Status   SARS Coronavirus 2 NEGATIVE NEGATIVE Final    Comment: (NOTE) SARS-CoV-2 target nucleic acids are NOT DETECTED. The SARS-CoV-2 RNA is generally detectable in upper and lower respiratory specimens during the acute phase of infection. Negative results do not preclude SARS-CoV-2 infection, do not rule out co-infections with other pathogens, and should not be used as the sole basis for treatment or other patient management decisions. Negative results must be combined with clinical observations, patient history, and epidemiological information. The expected result is Negative. Fact Sheet for Patients: HairSlick.no Fact Sheet for Healthcare Providers: quierodirigir.com This test is not yet approved or cleared by the [place] FDA and  has been authorized for detection and/or diagnosis of SARS-CoV-2 by FDA under an Emergency Use Authorization (EUA). This EUA will remain  in effect (meaning this test can be used) for the duration of the COVID-19 declaration under Section 56 4(b)(1) of the Act, 21  U.S.C. section 360bbb-3(b)(1), unless the authorization is terminated or revoked sooner. Performed at [facility], [place] [address], [place], [place] [address]     Procedures and diagnostic studies:  DG Chest 2 View  Result Date: 06/18/2019 CLINICAL DATA:  Chest pain and dizziness EXAM: CHEST - 2 VIEW COMPARISON:  04/22/2018 FINDINGS: Cardiac shadow is stable. Aortic calcifications are again seen. Interstitial prominence and scattered reticulonodular opacities are again seen and stable consistent with the given clinical history of sarcoidosis. No focal infiltrate or sizable effusion is seen. No bony abnormality is noted. IMPRESSION: Chronic changes  consistent with the given clinical history of sarcoidosis stable from the prior exam. Electronically Signed   By: Inez Catalina M.D.   On: 06/18/2019 15:44   CT Angio Chest PE W and/or Wo Contrast  Result Date: 06/18/2019 CLINICAL DATA:  Dyspnea. Hemoptysis the past 3 days. Sarcoidosis. EXAM: CT ANGIOGRAPHY CHEST WITH CONTRAST TECHNIQUE: Multidetector CT imaging of the chest was performed using the standard protocol during bolus administration of intravenous contrast. Multiplanar CT image reconstructions and MIPs were obtained to evaluate the vascular anatomy. CONTRAST:  74mL OMNIPAQUE IOHEXOL 350 MG/ML SOLN COMPARISON:  None. Unenhanced CT chest high-resolution dated March 06, 2018. FINDINGS: Cardiovascular: Peripheral segmental and subsegmental bilateral pulmonary emboli, including in the right apex and lung bases. No central or lobar pulmonary embolism. No right heart strain. Borderline cardiomegaly. A 3.5 cm pulmonary trunk diameter. Paucity of coronary calcification. Mediastinum/Nodes: Bulky mediastinal and hilar noncalcified lymph nodes consistent with the history of sarcoidosis. Lungs/Pleura: No pleural effusion or pneumothorax. Similar bilateral apical and lingular honeycombing and bilateral upper lobe mild traction bronchiectasis. Similar mild septal thickening. Nonspecific ground-glass opacities with wedge-shaped distributions in the left base and right middle lobe, not significantly changed. Upper Abdomen: Mild hepatomegaly. Cholecystectomy. Musculoskeletal: Mild skeletal degenerative change. Review of the MIP images confirms the above findings. I discussed critical Value/emergent results by telephone at the time of interpretation on 06/18/2019 at 7:52 pm with provider Janetta Hora, who verbally acknowledged these results. IMPRESSION: 1. Peripheral segmental and subsegmental bilateral pulmonary emboli. No right heart strain. 2. Bulky mediastinal and hilar lymph nodes and pulmonary findings consistent  with the history of sarcoidosis, possibly with an acute inflammatory or infectious exacerbation. 3. Pulmonary artery hypertension and borderline cardiomegaly. 4. Mild hepatomegaly. Electronically Signed   By: Revonda Humphrey   On: 06/18/2019 19:55    Medications:   . rosuvastatin  5 mg Oral Daily  . sodium chloride flush  3 mL Intravenous Q12H   Continuous Infusions: . heparin 1,200 Units/hr (06/19/19 0338)     LOS: 0 days   Radene Gunning NP  [facility]   How to contact the [facility] Attending or Consulting provider [place] or covering provider during after hours [place], for this patient?  1. Check the care team in [facility] and look for a) attending/consulting [facility] provider listed and b) the [facility] team listed 2. Log into [email or web address] and use [place]'s universal password to access. If you do not have the password, please contact the hospital operator. 3. Locate the [facility] provider you are looking for under [facility] and page to a number that you can be directly reached. 4. If you still have difficulty reaching the provider, please page the [facility] (Director on Call) for the Hospitalists listed on amion for assistance.  06/19/2019, 11:51 AM

## 2019-06-19 NOTE — Telephone Encounter (Signed)
I called and told Hailey Ross that pt is cancelling her sx due to being in the hospital. I am cancelling pt's postop visits and will also cancel her on Dr. Logan Bores' schedules in both the sx book and in Epic.

## 2019-06-19 NOTE — Progress Notes (Signed)
SATURATION QUALIFICATIONS: (This note is used to comply with regulatory documentation for home oxygen)  Patient Saturations on Room Air at Rest = 85%  Patient Saturations on Room Air while Ambulating = 81%  Patient Saturations on 3 Liters of oxygen while Ambulating = 96%  Please briefly explain why patient needs home oxygen:  Patient ambulated in her room at room air and destat at 81% .  Patient was placed on 3L [place], oxygen sat went up to 96%.  Patient is currently resting on 3L at 100% oxygen saturation.

## 2019-06-19 NOTE — Progress Notes (Signed)
Right lower extremity venous duplex has been completed. Preliminary results can be found in CV Proc through chart review.   06/19/19 3:18 PM Olen Cordial RVT

## 2019-06-19 NOTE — Progress Notes (Signed)
Echo in room

## 2019-06-19 NOTE — Telephone Encounter (Signed)
Pt called stating she's in the hospital and cancelled surgery

## 2019-06-19 NOTE — Progress Notes (Signed)
  Echocardiogram 2D Echocardiogram has been performed.  Hailey Ross 06/19/2019, 2:31 PM

## 2019-06-19 NOTE — Progress Notes (Signed)
ANTICOAGULATION CONSULT NOTE   Pharmacy Consult for heparin Indication: pulmonary embolus  No Known Allergies  Patient Measurements: Height: 5\' 5"  (165.1 cm) Weight: 222 lb (100.7 kg) IBW/kg (Calculated) : 57 Heparin Dosing Weight: 80.1  Vital Signs: BP: 144/93 (03/10 1000) Pulse Rate: 71 (03/10 1000)  Labs: Recent Labs    06/18/19 1522 06/18/19 1800 06/19/19 0227 06/19/19 1033  HGB 13.0  --  12.8  --   HCT 42.0  --  41.4  --   PLT 339  --  315  --   HEPARINUNFRC  --   --  1.02* 0.89*  CREATININE 1.23*  --  1.19*  --   TROPONINIHS 27* 28*  --   --     Estimated Creatinine Clearance: 67.3 mL/min (A) (by C-G formula based on SCr of 1.19 mg/dL (H)).   Medical History: Past Medical History:  Diagnosis Date  . Acute cholecystitis 08/24/2016  . Bronchiectasis (HCC)   . Sarcoidosis   . Smoker 02/12/2014    Medications:  Infusions:   Assessment: Ms 13/07/2013 is a 49 y/o F with noted peripheral segmental and subsegmental bilateral pulmonary emboli on CT imaging. No evidence of right heart strain. She was not on any anticoagulation PTA.  Heparin level remains supratherapeutic s/p rate decrease to 1200 units/hr  Goal of Therapy:  Heparin level 0.3-0.7 units/ml Monitor platelets by anticoagulation protocol: Yes   Plan:  Decrease heparin gtt to 1050 units/hr F/u 6 hour heparin level  54, PharmD Clinical Pharmacist ED Pharmacist Phone # [phone] 06/19/2019 12:09 PM

## 2019-06-19 NOTE — ED Notes (Signed)
Breakfast ordered 

## 2019-06-19 NOTE — ED Notes (Signed)
Attempted report 

## 2019-06-19 NOTE — Progress Notes (Signed)
ANTICOAGULATION CONSULT NOTE - Follow Up Consult  Pharmacy Consult for heparin Indication: pulmonary embolus  Labs: Recent Labs    06/18/19 1522 06/18/19 1800 06/19/19 0227  HGB 13.0  --  12.8  HCT 42.0  --  41.4  PLT 339  --  315  HEPARINUNFRC  --   --  1.02*  CREATININE 1.23*  --  1.19*  TROPONINIHS 27* 28*  --     Assessment: 49yo female supratherapeutic on heparin with initial dosing for PE; no gtt issues or signs of bleeding per RN.  Goal of Therapy:  Heparin level 0.3-0.7 units/ml   Plan:  Will decrease heparin gtt by 2 units/kgABW/hr to 1200 units/hr and check level in 6 hours.    Vernard Gambles, PharmD, BCPS  06/19/2019,3:37 AM

## 2019-06-20 ENCOUNTER — Encounter (HOSPITAL_COMMUNITY): Payer: Self-pay | Admitting: Internal Medicine

## 2019-06-20 DIAGNOSIS — N189 Chronic kidney disease, unspecified: Secondary | ICD-10-CM

## 2019-06-20 DIAGNOSIS — M79676 Pain in unspecified toe(s): Secondary | ICD-10-CM

## 2019-06-20 DIAGNOSIS — N179 Acute kidney failure, unspecified: Secondary | ICD-10-CM

## 2019-06-20 HISTORY — DX: Acute kidney failure, unspecified: N17.9

## 2019-06-20 HISTORY — DX: Chronic kidney disease, unspecified: N18.9

## 2019-06-20 LAB — CBC
HCT: 40.8 % (ref 36.0–46.0)
Hemoglobin: 12.9 g/dL (ref 12.0–15.0)
MCH: 27.4 pg (ref 26.0–34.0)
MCHC: 31.6 g/dL (ref 30.0–36.0)
MCV: 86.8 fL (ref 80.0–100.0)
Platelets: 337 10*3/uL (ref 150–400)
RBC: 4.7 MIL/uL (ref 3.87–5.11)
RDW: 13.5 % (ref 11.5–15.5)
WBC: 11.9 10*3/uL — ABNORMAL HIGH (ref 4.0–10.5)
nRBC: 0 % (ref 0.0–0.2)

## 2019-06-20 LAB — BASIC METABOLIC PANEL
Anion gap: 7 (ref 5–15)
BUN: 11 mg/dL (ref 6–20)
CO2: 25 mmol/L (ref 22–32)
Calcium: 9.1 mg/dL (ref 8.9–10.3)
Chloride: 105 mmol/L (ref 98–111)
Creatinine, Ser: 1.68 mg/dL — ABNORMAL HIGH (ref 0.44–1.00)
GFR calc Af Amer: 41 mL/min — ABNORMAL LOW (ref >60)
GFR calc non Af Amer: 35 mL/min — ABNORMAL LOW (ref >60)
Glucose, Bld: 107 mg/dL — ABNORMAL HIGH (ref 70–99)
Potassium: 4.2 mmol/L (ref 3.5–5.1)
Sodium: 137 mmol/L (ref 135–145)

## 2019-06-20 LAB — HEPARIN LEVEL (UNFRACTIONATED): Heparin Unfractionated: 0.49 IU/mL (ref 0.30–0.70)

## 2019-06-20 MED ORDER — RIVAROXABAN 15 MG PO TABS
15.0000 mg | ORAL_TABLET | Freq: Two times a day (BID) | ORAL | Status: DC
  Administered 2019-06-20: 15 mg via ORAL

## 2019-06-20 MED ORDER — ALBUTEROL SULFATE (2.5 MG/3ML) 0.083% IN NEBU
2.5000 mg | INHALATION_SOLUTION | RESPIRATORY_TRACT | Status: DC
  Administered 2019-06-20: 2.5 mg via RESPIRATORY_TRACT
  Filled 2019-06-20: qty 3

## 2019-06-20 MED ORDER — RIVAROXABAN (XARELTO) VTE STARTER PACK (15 & 20 MG): ORAL_TABLET | ORAL | 0 refills | Status: DC

## 2019-06-20 MED ORDER — ALBUTEROL SULFATE (2.5 MG/3ML) 0.083% IN NEBU: 2.5000 mg | INHALATION_SOLUTION | Freq: Two times a day (BID) | RESPIRATORY_TRACT | Status: DC

## 2019-06-20 NOTE — Progress Notes (Signed)
ANTICOAGULATION CONSULT NOTE   Pharmacy Consult for Heparin Indication: pulmonary embolus  No Known Allergies  Patient Measurements: Height: 5\' 5"  (165.1 cm) Weight: 220 lb 3.8 oz (99.9 kg) IBW/kg (Calculated) : 57 Heparin Dosing Weight: 80.1 kg  Vital Signs: Temp: 98.3 F (36.8 C) (03/11 0030) Temp Source: Oral (03/11 0030) BP: 129/83 (03/11 0030) Pulse Rate: 57 (03/11 0030)  Labs: Recent Labs    06/18/19 1522 06/18/19 1800 06/19/19 0227 06/19/19 0227 06/19/19 1033 06/19/19 1645 06/20/19 0005  HGB 13.0  --  12.8  --   --   --   --   HCT 42.0  --  41.4  --   --   --   --   PLT 339  --  315  --   --   --   --   HEPARINUNFRC  --   --  1.02*   < > 0.89* 0.70 0.49  CREATININE 1.23*  --  1.19*  --   --   --  1.68*  TROPONINIHS 27* 28*  --   --   --   --   --    < > = values in this interval not displayed.    Estimated Creatinine Clearance: 47.4 mL/min (A) (by C-G formula based on SCr of 1.68 mg/dL (H)).   Medical History: Past Medical History:  Diagnosis Date  . Acute cholecystitis 08/24/2016  . Bronchiectasis (HCC)   . Sarcoidosis   . Smoker 02/12/2014     Assessment: Ms 13/07/2013 is a 49 yr old female with noted peripheral segmental and subsegmental bilateral pulmonary emboli on CT imaging. No evidence of right heart strain. She was not on any anticoagulation PTA.  Heparin level ~5 hrs after heparin infusion was decreased to 1050 units/hr was 0.70 units/ml, which is at the upper end of the goal range for this pt. CBC WNL. Per RN, no issues with IV or bleeding observed.  3/11 AM update:  Heparin level therapeutic x 2  Goal of Therapy:  Heparin level 0.3-0.7 units/ml Monitor platelets by anticoagulation protocol: Yes   Plan:  Continue heparin infusion at 1050 units/hr Check daily heparin level, CBC Monitor signs/symptoms of bleeding  5/11, PharmD, BCPS Clinical Pharmacist Phone: [phone]

## 2019-06-20 NOTE — TOC Progression Note (Signed)
Transition of Care [facility]) - Progression Note    Patient Details  Name: Hailey Ross MRN: [id number] Date of Birth: 1971/04/04  [facility] CM/SW Contact  Zenon Mayo, RN Phone Number: 06/20/2019, 2:15 PM  Clinical Narrative:    NCM spoke with patient, the pharmacy gave her a 10.00 co pay coupon.  She will try to use it for her refills.  NCM informed her that she may not be able to use until the deductible is met.         Expected Discharge Plan and Services           Expected Discharge Date: 06/20/19                                     Social Determinants of Health (SDOH) Interventions    Readmission Risk Interventions No flowsheet data found.

## 2019-06-20 NOTE — Discharge Summary (Signed)
Physician Discharge Summary  Hailey Ross VHQ:[id number] DOB: 1970-12-07 DOA: 06/18/2019  PCP: Freddy FinnerMills, Hannah M, NP  Admit date: 06/18/2019 Discharge date: 06/20/2019  Admitted From: home Discharge disposition: home   Recommendations for Outpatient Follow-Up:   1. Follow up with Dr Craige CottaSood 2-3 weeks for evaluation of respiratory status setting of pulmonary embolus. 2. Follow-up with nephrology as scheduled. 3. Follow-up with primary care provider 3 to 4 weeks for evaluation of symptoms/oxygen saturation level/hemoptysis and blood pressure   Discharge Diagnosis:   Principal Problem:   Acute respiratory failure with hypoxia (HCC) Active Problems:   Lightheadedness   Bilateral pulmonary embolism (HCC)   Hemoptysis   Sarcoidosis   Bronchiectasis (HCC)   Class 2 obesity due to excess calories with body mass index (BMI) of 37.0 to 37.9 in adult   Acute kidney injury superimposed on chronic kidney disease (HCC)   CKD (chronic kidney disease)    Discharge Condition: Improved.  Diet recommendation: Low sodium, heart healthy.    Wound care: None.  Code status: Full.   History of Present Illness:   Hailey Ross is a very pleasant 49 y.o. female with medical history significant for sarcoidosis with bronchiectasis and hyperlipidemia, HTN, obesity who presented 3/9 to the ED for evaluation of chest tightness and cough productive of bloody sputum.  Patient reported about 1 month of intermittent lightheadedness which occured with or without activity.  She was seen at urgent care and by her PCP without obvious etiology.  She had not lost consciousness or fallen from these episodes.  She said she has a chronic cough due to her sarcoidosis usually productive of mucus.  On 3/9 while at work she coughed up a small blood clot and then had a couple more episodes of spotty blood in her sputum.  She also reported recent trickling nosebleeds.  She denied any other obvious bleeding  except for her current menses which was unchanged from regular.  She had intermittent central chest discomfort without radiation or associated with activity.  She denied any shortness of breath, nausea, vomiting, abdominal pain, dysuria.  She reported recent pain in the back of her right lower extremity at the calf and behind the knee.  She denied any personal history of previous blood clots or any family history of blood clots as far she knows.  She is a former smoker and quit smoking 5 years ago.  She had not had any recent surgeries.   Hospital Course by Problem:   #1.  Acute respiratory failure with hypoxia likely related to bilateral pulmonary embolism in setting of sarcoidosis with bronchiectasis. Resolved at discharge. Oxygen saturation level with ambulation on room air 93%.  Oxygen saturation level dropped to 77% on room air with ambulation in the ED. She reports some worsening shortness of breath with activity. Patient is not oxygen dependent at baseline and has demonstrated increased oxygen demand. Saw pulmonology recently. Recommend post hospital follow up 2-3 weeks. Continue home inhalers and nebulizers.   #2.  Bilateral pulmonary embolism.  She reported that she had been more sedentary last several months in her job.  She developed pain in her lower right leg about a month ago. She is a former smoker no history of blood clots.  No recent surgery.  She reported she is "always short of breath" did not notice a worsening. Developed hemoptysis as noted above. She also noted worsening "lightheadedness". Echo with EF 60% mild LVH, LE dopplar no dvt.   Heparin  drip was initiated and she was transitioned to Xarelto at discharge. Follow up with PCP 1-2 weeks for evaluation of respiratory status.   #3.  Sarcoidosis with bronchiectasis.  Chronic and stable according to the patient.  No longer on prednisone.  Home medications include an inhaler.  CT notes findings consistent with history of  sarcoidosis possibly with an acute inflammatory or infectious exacerbation.  She remained afebrile hemodynamically stable.  #4.  Hyperlipidemia. Continue home meds  #5.  Hypertension.  Blood pressure high end of normal.  No history of same. Follow up with PCP for evaluation of BP control  #6.  Obesity.  BMI 36.9.  Counseled regarding weight loss  #7. AKI superimposed on  CKD stage 2.  Patient reported that she has been told she has "kidney issues" and she has appointment with nephrology in 3 weeks. Creatinine 1.2 on admission and 1.68 at discharge. Likely related to decreased oral intake. BUN within limits of normal. Instructed to follow up with nephrology as scheduled   Medical Consultants:      Discharge Exam:   Vitals:   06/20/19 0539 06/20/19 0756  BP: 130/81 126/79  Pulse: 62 (!) 59  Resp: 18 18  Temp: 98.1 F (36.7 C) 98.3 F (36.8 C)  SpO2: 98% 100%   Vitals:   06/20/19 0030 06/20/19 0539 06/20/19 0541 06/20/19 0756  BP: 129/83 130/81  126/79  Pulse: (!) 57 62  (!) 59  Resp: Temp: 98.3 F (36.8 C) 98.1 F (36.7 C)  98.3 F (36.8 C)  TempSrc: Oral Oral  Oral  SpO2: 100% 98%  100%  Weight:   99.1 kg   Height:        General exam: Appears calm and comfortable.  Respiratory system: Clear to auscultation but BS distant.  Respiratory effort normal. Cardiovascular system: S1 & S2 heard, RRR. No JVD,  rubs, gallops or clicks. No murmurs. Gastrointestinal system: Abdomen is nondistended, soft and nontender. No organomegaly or masses felt. Normal bowel sounds heard. Central nervous system: Alert and oriented. No focal neurological deficits. Extremities: No clubbing,  or cyanosis. No edema. Skin: No rashes, lesions or ulcers. Psychiatry: Judgement and insight appear normal. Mood & affect appropriate.    The results of significant diagnostics from this hospitalization (including imaging, microbiology, ancillary and laboratory) are listed below for  reference.     Procedures and Diagnostic Studies:   DG Chest 2 View  Result Date: 06/18/2019 CLINICAL DATA:  Chest pain and dizziness EXAM: CHEST - 2 VIEW COMPARISON:  04/22/2018 FINDINGS: Cardiac shadow is stable. Aortic calcifications are again seen. Interstitial prominence and scattered reticulonodular opacities are again seen and stable consistent with the given clinical history of sarcoidosis. No focal infiltrate or sizable effusion is seen. No bony abnormality is noted. IMPRESSION: Chronic changes consistent with the given clinical history of sarcoidosis stable from the prior exam. Electronically Signed   By: Alcide Clever M.D.   On: 06/18/2019 15:44   CT Angio Chest PE W and/or Wo Contrast  Result Date: 06/18/2019 CLINICAL DATA:  Dyspnea. Hemoptysis the past 3 days. Sarcoidosis. EXAM: CT ANGIOGRAPHY CHEST WITH CONTRAST TECHNIQUE: Multidetector CT imaging of the chest was performed using the standard protocol during bolus administration of intravenous contrast. Multiplanar CT image reconstructions and MIPs were obtained to evaluate the vascular anatomy. CONTRAST:  80mL OMNIPAQUE IOHEXOL 350 MG/ML SOLN COMPARISON:  None. Unenhanced CT chest high-resolution dated March 06, 2018. FINDINGS: Cardiovascular: Peripheral segmental and subsegmental bilateral pulmonary emboli,  including in the right apex and lung bases. No central or lobar pulmonary embolism. No right heart strain. Borderline cardiomegaly. A 3.5 cm pulmonary trunk diameter. Paucity of coronary calcification. Mediastinum/Nodes: Bulky mediastinal and hilar noncalcified lymph nodes consistent with the history of sarcoidosis. Lungs/Pleura: No pleural effusion or pneumothorax. Similar bilateral apical and lingular honeycombing and bilateral upper lobe mild traction bronchiectasis. Similar mild septal thickening. Nonspecific ground-glass opacities with wedge-shaped distributions in the left base and right middle lobe, not significantly changed.  Upper Abdomen: Mild hepatomegaly. Cholecystectomy. Musculoskeletal: Mild skeletal degenerative change. Review of the MIP images confirms the above findings. I discussed critical Value/emergent results by telephone at the time of interpretation on 06/18/2019 at 7:52 pm with provider Terance Hart, who verbally acknowledged these results. IMPRESSION: 1. Peripheral segmental and subsegmental bilateral pulmonary emboli. No right heart strain. 2. Bulky mediastinal and hilar lymph nodes and pulmonary findings consistent with the history of sarcoidosis, possibly with an acute inflammatory or infectious exacerbation. 3. Pulmonary artery hypertension and borderline cardiomegaly. 4. Mild hepatomegaly. Electronically Signed   By: Laurence Ferrari   On: 06/18/2019 19:55   ECHOCARDIOGRAM COMPLETE  Result Date: 06/19/2019    ECHOCARDIOGRAM REPORT   Patient Name:   CYTHIA BACHTEL Date of Exam: 06/19/2019 Medical Rec #:  [id number]          Height:       65.0 in Accession #:    [id number]         Weight:       222.0 lb Date of Birth:  1971-01-11          BSA:          2.068 m Patient Age:    49 years           BP:           132/84 mmHg Patient Gender: F                  HR:           62 bpm. Exam Location:  Inpatient Procedure: 2D Echo, Cardiac Doppler and Color Doppler Indications:    I26.02 Pulmonary embolus  History:        Patient has no prior history of Echocardiogram examinations.                 Signs/Symptoms:Dyspnea. Sarcoidosis.  Sonographer:    Sheralyn Boatman RDCS Referring Phys: [phone] [facility] Lachele Lievanos  Sonographer Comments: Technically difficult study due to poor echo windows and patient is morbidly obese. Image acquisition challenging due to patient body habitus. IMPRESSIONS  1. Left ventricular ejection fraction, by estimation, is 60 to 65%. The left ventricle has normal function. The left ventricle has no regional wall motion abnormalities. There is mild left ventricular hypertrophy. Left ventricular diastolic parameters were  normal.  2. Right ventricular systolic function is mildly reduced. The right ventricular size is normal. There is moderately elevated pulmonary artery systolic pressure.  3. The mitral valve is abnormal. Trivial mitral valve regurgitation.  4. The aortic valve is tricuspid. Aortic valve regurgitation is not visualized.  5. Aortic dilatation noted. There is mild dilatation of the ascending aorta measuring 39 mm. There is Moderate (Grade III) protruding plaque involving the distal aortc arch and proximal descending aorta.  6. The inferior vena cava is normal in size with <50% respiratory variability, suggesting right atrial pressure of 8 mmHg. FINDINGS  Left Ventricle: Left ventricular ejection fraction, by estimation, is 60 to 65%. The left  ventricle has normal function. The left ventricle has no regional wall motion abnormalities. The left ventricular internal cavity size was normal in size. There is  mild left ventricular hypertrophy. Left ventricular diastolic parameters were normal. Right Ventricle: The right ventricular size is normal. No increase in right ventricular wall thickness. Right ventricular systolic function is mildly reduced. There is moderately elevated pulmonary artery systolic pressure. The tricuspid regurgitant velocity is 3.50 m/s, and with an assumed right atrial pressure of 8 mmHg, the estimated right ventricular systolic pressure is 57.0 mmHg. Left Atrium: Left atrial size was normal in size. Right Atrium: Right atrial size was normal in size. Pericardium: There is no evidence of pericardial effusion. Mitral Valve: The mitral valve is abnormal. There is mild thickening of the mitral valve leaflet(s). Trivial mitral valve regurgitation. Tricuspid Valve: The tricuspid valve is grossly normal. Tricuspid valve regurgitation is mild. Aortic Valve: The aortic valve is tricuspid. Aortic valve regurgitation is not visualized. Pulmonic Valve: The pulmonic valve was normal in structure. Pulmonic valve  regurgitation is not visualized. Aorta: Aortic dilatation noted. There is mild dilatation of the ascending aorta measuring 39 mm. There is moderate (Grade III) protruding plaque involving the descending aorta. Venous: The inferior vena cava is normal in size with less than 50% respiratory variability, suggesting right atrial pressure of 8 mmHg. IAS/Shunts: No atrial level shunt detected by color flow Doppler.  LEFT VENTRICLE PLAX 2D LVIDd:         4.71 cm      Diastology LVIDs:         2.74 cm      LV e' lateral:   13.40 cm/s LV PW:         1.11 cm      LV E/e' lateral: 6.8 LV IVS:        1.15 cm      LV e' medial:    9.14 cm/s LVOT diam:     2.10 cm      LV E/e' medial:  9.9 LV SV:         83 LV SV Index:   40 LVOT Area:     3.46 cm  LV Volumes (MOD) LV vol d, MOD A2C: 97.9 ml LV vol d, MOD A4C: 117.0 ml LV vol s, MOD A2C: 40.2 ml LV vol s, MOD A4C: 50.9 ml LV SV MOD A2C:     57.7 ml LV SV MOD A4C:     117.0 ml LV SV MOD BP:      62.5 ml RIGHT VENTRICLE            IVC RV S prime:     9.68 cm/s  IVC diam: 1.92 cm TAPSE (M-mode): 2.4 cm LEFT ATRIUM             Index       RIGHT ATRIUM           Index LA diam:        3.30 cm 1.60 cm/m  RA Area:     18.30 cm LA Vol (A2C):   34.0 ml 16.44 ml/m RA Volume:   56.80 ml  27.47 ml/m LA Vol (A4C):   39.5 ml 19.10 ml/m LA Biplane Vol: 37.4 ml 18.09 ml/m  AORTIC VALVE LVOT Vmax:   129.00 cm/s LVOT Vmean:  81.300 cm/s LVOT VTI:    0.240 m  AORTA Ao Root diam: 3.00 cm Ao Asc diam:  3.90 cm MITRAL VALVE  TRICUSPID VALVE MV Area (PHT): 4.21 cm    TR Peak grad:   49.0 mmHg MV Decel Time: 180 msec    TR Vmax:        350.00 cm/s MV E velocity: 90.80 cm/s MV A velocity: 81.80 cm/s  SHUNTS MV E/A ratio:  1.11        Systemic VTI:  0.24 m                            Systemic Diam: 2.10 cm Zoila Shutter MD Electronically signed by Zoila Shutter MD Signature Date/Time: 06/19/2019/3:36:12 PM    Final    VAS [place] LOWER EXTREMITY VENOUS (DVT) (ONLY [facility] & [facility])  Result Date:  06/19/2019  Lower Venous DVTStudy Indications: Edema.  Risk Factors: None identified. Comparison Study: No prior studies. Performing Technologist: Chanda Busing RVT  Examination Guidelines: A complete evaluation includes B-mode imaging, spectral Doppler, color Doppler, and power Doppler as needed of all accessible portions of each vessel. Bilateral testing is considered an integral part of a complete examination. Limited examinations for reoccurring indications may be performed as noted. The reflux portion of the exam is performed with the patient in reverse Trendelenburg.  +---------+---------------+---------+-----------+----------+--------------+ RIGHT    CompressibilityPhasicitySpontaneityPropertiesThrombus Aging +---------+---------------+---------+-----------+----------+--------------+ CFV      Full           Yes      Yes                                 +---------+---------------+---------+-----------+----------+--------------+ SFJ      Full                                                        +---------+---------------+---------+-----------+----------+--------------+ FV Prox  Full                                                        +---------+---------------+---------+-----------+----------+--------------+ FV Mid   Full                                                        +---------+---------------+---------+-----------+----------+--------------+ FV DistalFull                                                        +---------+---------------+---------+-----------+----------+--------------+ PFV      Full                                                        +---------+---------------+---------+-----------+----------+--------------+ POP      Full  Yes      Yes                                 +---------+---------------+---------+-----------+----------+--------------+ PTV      Full                                                         +---------+---------------+---------+-----------+----------+--------------+ PERO     Full                                                        +---------+---------------+---------+-----------+----------+--------------+   +----+---------------+---------+-----------+----------+--------------+ LEFTCompressibilityPhasicitySpontaneityPropertiesThrombus Aging +----+---------------+---------+-----------+----------+--------------+ CFV Full           Yes      Yes                                 +----+---------------+---------+-----------+----------+--------------+     Summary: RIGHT: - There is no evidence of deep vein thrombosis in the lower extremity.  - No cystic structure found in the popliteal fossa.  LEFT: - No evidence of common femoral vein obstruction.  *See table(s) above for measurements and observations.    Preliminary      Labs:   Basic Metabolic Panel: Recent Labs  Lab 06/18/19 1522 06/18/19 1522 06/19/19 0227 06/20/19 0005  NA 138  --  138 137  K 3.8   < > 3.8 4.2  CL 107  --  107 105  CO2 22  --  23 25  GLUCOSE 138*  --  93 107*  BUN 10  --  9 11  CREATININE 1.23*  --  1.19* 1.68*  CALCIUM 8.7*  --  8.9 9.1   < > = values in this interval not displayed.   GFR Estimated Creatinine Clearance: 47.2 mL/min (A) (by C-G formula based on SCr of 1.68 mg/dL (H)). Liver Function Tests: No results for input(s): AST, ALT, ALKPHOS, BILITOT, PROT, ALBUMIN in the last 168 hours. No results for input(s): LIPASE, AMYLASE in the last 168 hours. No results for input(s): AMMONIA in the last 168 hours. Coagulation profile No results for input(s): INR, PROTIME in the last 168 hours.  CBC: Recent Labs  Lab 06/18/19 1522 06/19/19 0227 06/20/19 0005  WBC 11.0* 11.1* 11.9*  HGB 13.0 12.8 12.9  HCT 42.0 41.4 40.8  MCV 86.6 87.3 86.8  PLT 339 315 337   Cardiac Enzymes: No results for input(s): CKTOTAL, CKMB, CKMBINDEX, TROPONINI in the last 168 hours. BNP: Invalid  input(s): POCBNP CBG: No results for input(s): GLUCAP in the last 168 hours. D-Dimer No results for input(s): DDIMER in the last 72 hours. Hgb A1c No results for input(s): HGBA1C in the last 72 hours. Lipid Profile No results for input(s): CHOL, HDL, LDLCALC, TRIG, CHOLHDL, LDLDIRECT in the last 72 hours. Thyroid function studies No results for input(s): TSH, T4TOTAL, T3FREE, THYROIDAB in the last 72 hours.  Invalid input(s): FREET3 Anemia work up No results for input(s): VITAMINB12, FOLATE, FERRITIN, TIBC, IRON, RETICCTPCT in the last 72 hours. Microbiology Recent Results (from the past 240  hour(s))  SARS CORONAVIRUS 2 (TAT 6-24 HRS) Nasopharyngeal Nasopharyngeal Swab     Status: None   Collection Time: 06/18/19  8:15 PM   Specimen: Nasopharyngeal Swab  Result Value Ref Range Status   SARS Coronavirus 2 NEGATIVE NEGATIVE Final    Comment: (NOTE) SARS-CoV-2 target nucleic acids are NOT DETECTED. The SARS-CoV-2 RNA is generally detectable in upper and lower respiratory specimens during the acute phase of infection. Negative results do not preclude SARS-CoV-2 infection, do not rule out co-infections with other pathogens, and should not be used as the sole basis for treatment or other patient management decisions. Negative results must be combined with clinical observations, patient history, and epidemiological information. The expected result is Negative. Fact Sheet for Patients: SugarRoll.be Fact Sheet for Healthcare Providers: https://www.woods-mathews.com/ This test is not yet approved or cleared by the [place] FDA and  has been authorized for detection and/or diagnosis of SARS-CoV-2 by FDA under an Emergency Use Authorization (EUA). This EUA will remain  in effect (meaning this test can be used) for the duration of the COVID-19 declaration under Section 56 4(b)(1) of the Act, 21 U.S.C. section 360bbb-3(b)(1), unless the  authorization is terminated or revoked sooner. Performed at [facility], [address], [place], [place] [address]      Discharge Instructions:   Discharge Instructions    Call MD for:  difficulty breathing, headache or visual disturbances   Complete by: As directed    Call MD for:  extreme fatigue   Complete by: As directed    Call MD for:  persistant dizziness or light-headedness   Complete by: As directed    Call MD for:  severe uncontrolled pain   Complete by: As directed    Call MD for:  temperature >100.4   Complete by: As directed    Diet - low sodium heart healthy   Complete by: As directed    Discharge instructions   Complete by: As directed    Take medication as directed Follow up with Dr. Halford Chessman in 2-3 weeks Follow up with Nephrology as scheduled. Have oxygen saturation level checked at that appointment   Increase activity slowly   Complete by: As directed      Allergies as of 06/20/2019   No Known Allergies     Medication List    STOP taking these medications   meclizine 12.5 MG tablet Commonly known as: ANTIVERT     TAKE these medications   acetaminophen 500 MG tablet Commonly known as: TYLENOL Take 500 mg by mouth as needed for moderate pain.   albuterol 108 (90 Base) MCG/ACT inhaler Commonly known as: ProAir HFA Inhale 2 puffs into the lungs every 6 (six) hours as needed for wheezing or shortness of breath.   Flutter Devi Use flutter valve 3 times a day   Rivaroxaban 15 & 20 MG Tbpk Follow package directions: Take one 15mg  tablet by mouth twice a day. On day 22, switch to one 20mg  tablet once a day. Take with food.   rosuvastatin 5 MG tablet Commonly known as: CRESTOR Take 1 tablet (5 mg total) by mouth daily.   Vitamin D (Ergocalciferol) 1.25 MG (50000 UNIT) Caps capsule Commonly known as: DRISDOL Take 1 capsule (50,000 Units total) by mouth every 7 (seven) days. What changed: additional instructions      Follow-up Information     Perlie Mayo, NP.   Specialty: Family Medicine Why: Office will call patient Contact information: [place]  [place] [address] [phone]            Time coordinating discharge: 35 minutes  Signed:  Gwenyth Bender NP  [facility] 06/20/2019, 11:38 AM

## 2019-06-20 NOTE — Progress Notes (Signed)
SATURATION QUALIFICATIONS: (This note is used to comply with regulatory documentation for home oxygen)  Patient Saturations on Room Air at Rest = 95%  Patient Saturations on Room Air while Ambulating = 93%  Patient Saturations on 0 Liters of oxygen while Ambulating = N/A  Please briefly explain why patient needs home oxygen: N/A

## 2019-06-20 NOTE — Progress Notes (Signed)
Patient ambulated approximately 200 feet without the use of an assistive device. Oxygen saturation on room air while at rest was 95%, oxygen saturation while ambulating on room air was 93%. Patient denied chest pain or shortness of breath while ambulating.

## 2019-06-20 NOTE — Discharge Instructions (Signed)
Information on my medicine - XARELTO® (rivaroxaban) ° °This medication education was reviewed with me or my healthcare representative as part of my discharge preparation.  The pharmacist that spoke with me during my hospital stay was:  Czar Ysaguirre Kay, RPH ° °WHY WAS XARELTO® PRESCRIBED FOR YOU? °Xarelto® was prescribed to treat blood clots that may have been found in the veins of your legs (deep vein thrombosis) or in your lungs (pulmonary embolism) and to reduce the risk of them occurring again. ° °What do you need to know about Xarelto®? °The starting dose is one 15 mg tablet taken TWICE daily with food for the FIRST 21 DAYS then  the dose is changed to one 20 mg tablet taken ONCE A DAY with your evening meal. ° °DO NOT stop taking Xarelto® without talking to the health care provider who prescribed the medication.  Refill your prescription for 20 mg tablets before you run out. ° °After discharge, you should have regular check-up appointments with your healthcare provider that is prescribing your Xarelto®.  In the future your dose may need to be changed if your kidney function changes by a significant amount. ° °What do you do if you miss a dose? °If you are taking Xarelto® TWICE DAILY and you miss a dose, take it as soon as you remember. You may take two 15 mg tablets (total 30 mg) at the same time then resume your regularly scheduled 15 mg twice daily the next day. ° °If you are taking Xarelto® ONCE DAILY and you miss a dose, take it as soon as you remember on the same day then continue your regularly scheduled once daily regimen the next day. Do not take two doses of Xarelto® at the same time.  ° °Important Safety Information °Xarelto® is a blood thinner medicine that can cause bleeding. You should call your healthcare provider right away if you experience any of the following: °? Bleeding from an injury or your nose that does not stop. °? Unusual colored urine (red or dark brown) or unusual colored stools  (red or black). °? Unusual bruising for unknown reasons. °? A serious fall or if you hit your head (even if there is no bleeding). ° °Some medicines may interact with Xarelto® and might increase your risk of bleeding while on Xarelto®. To help avoid this, consult your healthcare provider or pharmacist prior to using any new prescription or non-prescription medications, including herbals, vitamins, non-steroidal anti-inflammatory drugs (NSAIDs) and supplements. ° °This website has more information on Xarelto®: www.xarelto.com. °

## 2019-06-20 NOTE — Progress Notes (Addendum)
ANTICOAGULATION CONSULT NOTE   Pharmacy Consult for Heparin to Xarelto  Indication: pulmonary embolus  No Known Allergies  Patient Measurements: Height: 5\' 5"  (165.1 cm) Weight: 218 lb 8 oz (99.1 kg) IBW/kg (Calculated) : 57 Heparin Dosing Weight: 80.1 kg  Vital Signs: Temp: 98.3 F (36.8 C) (03/11 0756) Temp Source: Oral (03/11 0756) BP: 126/79 (03/11 0756) Pulse Rate: 59 (03/11 0756)  Labs: Recent Labs    06/18/19 1522 06/18/19 1522 06/18/19 1800 06/19/19 0227 06/19/19 0227 06/19/19 1033 06/19/19 1645 06/20/19 0005  HGB 13.0   < >  --  12.8  --   --   --  12.9  HCT 42.0  --   --  41.4  --   --   --  40.8  PLT 339  --   --  315  --   --   --  337  HEPARINUNFRC  --   --   --  1.02*   < > 0.89* 0.70 0.49  CREATININE 1.23*  --   --  1.19*  --   --   --  1.68*  TROPONINIHS 27*  --  28*  --   --   --   --   --    < > = values in this interval not displayed.    Estimated Creatinine Clearance: 47.2 mL/min (A) (by C-G formula based on SCr of 1.68 mg/dL (H)).   Medical History: Past Medical History:  Diagnosis Date  . Acute cholecystitis 08/24/2016  . Bronchiectasis (HCC)   . PE (pulmonary thromboembolism) (HCC) 06/2019  . Sarcoidosis   . Sarcoidosis   . Smoker 02/12/2014     Assessment: Ms 13/07/2013 is a 49 yr old female with noted peripheral segmental and subsegmental bilateral pulmonary emboli on CT imaging. No evidence of right heart strain. She was not on any anticoagulation PTA.  Heparin level therapeutic this AM  Goal of Therapy:  Heparin level 0.3-0.7 units/ml Monitor platelets by anticoagulation protocol: Yes   Plan:  Continue heparin infusion at 1050 units/hr Check daily heparin level, CBC Monitor signs/symptoms of bleeding  Transitioning to Xarelto this AM - > 15 mg po BID x 3 weeks then 20 mg po daily, provided with first month supply from [facility] pharmacy - also given commercial insurance copay card to help with future fills.  Educated about dosing  and side effects without any questions.  Thank you [facility], PharmD [phone]

## 2019-06-20 NOTE — Progress Notes (Signed)
Patient sating well- 95-98% on 2L O2 via nasal cannula.

## 2019-06-20 NOTE — Plan of Care (Signed)
  Problem: Education: Goal: Knowledge of General Education information will improve Description: Including pain rating scale, medication(s)/side effects and non-pharmacologic comfort measures Outcome: Progressing   Problem: Health Behavior/Discharge Planning: Goal: Ability to manage health-related needs will improve Outcome: Progressing   Problem: Clinical Measurements: Goal: Respiratory complications will improve Outcome: Progressing   

## 2019-06-20 NOTE — Progress Notes (Addendum)
Discharge education and medication education given to patient  with taech back. Education on heart healthy diet, increasing activity slowly, and when to cal lMD given.  All questions and concerns answered. Peripheral IV and telemetry leads removed. All patient belongings given to patient. Hailey Smothers, NP signed letter stating patient's return to work, RN delivered letter to patient. Patient transported to main entrance via wheelchair by nurse tech.

## 2019-06-20 NOTE — TOC Benefit Eligibility Note (Signed)
Transition of Care [facility]) Benefit Eligibility Note    Patient Details  Name: Hailey Ross MRN: [id number] Date of Birth: [birth year]   Medication/Dose: Eliquis 2.5mg  5mg   Covered?: Yes  Tier: 2 Drug  Prescription Coverage Preferred Pharmacy: [facility]  Spoke with Person/Company/Phone Number:: [phone]  Co-Pay: 497.67 for a 30 day supply retail 439.00 for a 30 day supply Mail Order  Prior Approval: No  Deductible: Unmet(1004.00/ 653.96 unmet)  Additional Notes: Ask about Alternative there are no alternative high copay due to unmet deductible    [phone] Phone Number: 06/20/2019, 8:32 AM

## 2019-06-21 ENCOUNTER — Encounter: Payer: Self-pay | Admitting: Family Medicine

## 2019-06-22 ENCOUNTER — Encounter: Payer: Self-pay | Admitting: Family Medicine

## 2019-06-24 ENCOUNTER — Other Ambulatory Visit: Payer: Self-pay | Admitting: *Deleted

## 2019-06-24 DIAGNOSIS — E785 Hyperlipidemia, unspecified: Secondary | ICD-10-CM

## 2019-06-24 MED ORDER — ROSUVASTATIN CALCIUM 5 MG PO TABS: 5.0000 mg | ORAL_TABLET | Freq: Every day | ORAL | 1 refills | Status: DC

## 2019-06-26 ENCOUNTER — Encounter: Payer: 59 | Admitting: Podiatry

## 2019-07-02 ENCOUNTER — Ambulatory Visit (INDEPENDENT_AMBULATORY_CARE_PROVIDER_SITE_OTHER): Payer: 59 | Admitting: Adult Health

## 2019-07-02 ENCOUNTER — Other Ambulatory Visit: Payer: Self-pay

## 2019-07-02 ENCOUNTER — Encounter: Payer: Self-pay | Admitting: Adult Health

## 2019-07-02 VITALS — BP 130/84 | HR 78 | Ht 66.0 in | Wt 223.0 lb

## 2019-07-02 DIAGNOSIS — J479 Bronchiectasis, uncomplicated: Secondary | ICD-10-CM

## 2019-07-02 DIAGNOSIS — G4719 Other hypersomnia: Secondary | ICD-10-CM

## 2019-07-02 DIAGNOSIS — D869 Sarcoidosis, unspecified: Secondary | ICD-10-CM | POA: Diagnosis not present

## 2019-07-02 DIAGNOSIS — I272 Pulmonary hypertension, unspecified: Secondary | ICD-10-CM | POA: Insufficient documentation

## 2019-07-02 DIAGNOSIS — I2699 Other pulmonary embolism without acute cor pulmonale: Secondary | ICD-10-CM | POA: Diagnosis not present

## 2019-07-02 MED ORDER — RIVAROXABAN 20 MG PO TABS: 20.0000 mg | ORAL_TABLET | Freq: Every day | ORAL | 5 refills | Status: DC

## 2019-07-02 NOTE — Assessment & Plan Note (Signed)
Patient has risk factors for daytime hypersomnolence including daytime sleepiness, snoring, restless sleep, BMI and pulmonary hypertension on echo.  Set up for home sleep study

## 2019-07-02 NOTE — Assessment & Plan Note (Signed)
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  Patient will continue on Xarelto starter pack and then transition to Xarelto 20 mg daily. We will need a follow-up 2D echo in 3 months as she had pulmonary hypertension noted on echo. Due to patient's young age and unprovoked PE will refer to hematology to help guide longevity of therapy. And further evaluate possible causes - will need hypercoagulable panel  Plan  Patient Instructions  Continue on Xarelto .  Refer to Hematology for unprovoked PE. (when available in next couple of months)  Avoid NSAIDS  2 D echo in 3 months .  Flutter valve Three times a day   Set up for Home Sleep study in 4 weeks.  Advance activity as tolerated.  Follow up in 2 months with Dr. Craige Cotta  With PFT and As needed

## 2019-07-02 NOTE — Assessment & Plan Note (Signed)
Elevated pulmonary artery pressures/pulmonary hypertension noted on 2D echo most likely secondary to bilateral PE However patient has underlying chronic lung disease, and risk factors for sleep apnea.  Will check home sleep study.. We will need a repeat 2D echo to follow pulmonary artery pressures in 3 months

## 2019-07-02 NOTE — Assessment & Plan Note (Addendum)
Sarcoidosis appears stable.  Continue on current regimen Check PFTs on return  Plan  Patient Instructions  Continue on Xarelto .  Refer to Hematology for unprovoked PE. (when available in next couple of months)  Avoid NSAIDS  2 D echo in 3 months .  Flutter valve Three times a day   Set up for Home Sleep study in 4 weeks.  Advance activity as tolerated.  Follow up in 2 months with Dr. Craige Cotta  With PFT and As needed

## 2019-07-02 NOTE — Patient Instructions (Addendum)
Continue on Xarelto .  Refer to Hematology for unprovoked PE. (when available in next couple of months)  Avoid NSAIDS  2 D echo in 3 months .  Flutter valve Three times a day   Set up for Home Sleep study in 4 weeks.  Advance activity as tolerated.  Follow up in 2 months with Dr. Craige Cotta  With PFT and As needed

## 2019-07-02 NOTE — Assessment & Plan Note (Signed)
Currently stable continue on pulmonary hygiene regimen

## 2019-07-02 NOTE — Progress Notes (Signed)
@Patient  ID: Hailey Ross, female    DOB: 05-18-1970, 49 y.o.   MRN: [id number]  Chief Complaint  Patient presents with  . Follow-up    PE     Referring provider: Perlie Mayo, NP  HPI: 49 year old female never smoker followed for pulmonary sarcoidosis and bronchiectasis Diagnosed with pulmonary emboli on March 2021   TEST/EVENTS :  Labs 04/21/15 >> HIV negative, Quantiferon gold negative, ACE 195, ANA, RF negative, ESR 32 PFT 05/26/15 >> FEV1 1.67 (66%), FEV1% 88, TLC 2/78 (53%), DLCO 36%  Chest imaging:  CT chest 03/18/15 >> biapical thickening with traction BTX, BTX RML and lingula, subcarinal LAN 1.7 cm, patchy increased interstitial markings HRCT chest 03/07/18 >> borderline LAN, patchy GGO, septal thickening, architectural distortion, cylindrical and varicose BTX  CT chest June 18, 2019 showed peripheral segmental and subsegmental bilateral pulmonary emboli, bulky mediastinal and hilar lymph nodes, bilateral apical and lingular honeycombing and bilateral upper lobe mild traction bronchiectasis.  Nonspecific groundglass opacities in the left base and right middle lobe.  Not significantly changed  07/02/2019 Follow-up hospitalization, PE, sarcoid, bronchiectasis Patient presents for a follow-up from recent hospitalization earlier this month.  She has been having some intermittent episodes of lightheadedness.  She developed shortness of breath and hemoptysis.  Was admitted to the hospital on June 18, 2019 found to have Bilateral pulmonary embolism.  Patient has no recent falls, injury, surgery, no long road trips, no birth control or hormone therapy.  She has no history of VTE or family history of VTE. She does work full-time.  Says she has not been as active as usual but leads in normal daily activities. Venous Dopplers were negative for DVT 2D echo showed EF at 6065%, mildly reduced right ventricular systolic function, moderately elevated pulmonary artery systolic  [id number] mmHg.  Moderate grade 3 protruding plaque involving the distal aortic arch and proximal descending aorta Since discharge patient is feeling better.  She has less dyspnea.  She says her activity intolerance is improving but she still gets short of breath with heavy activity. She denies any hemoptysis chest pain orthopnea.  She has been started on Xarelto.  She is currently on starter pack She denies any known bleeding.  Patient education on anticoagulation.  We discussed risk factors for pulmonary hypertension.  She does have some obesity.  Has some daytime sleepiness, restless sleep and snoring which could be indicative of underlying sleep apnea.  She denies any trouble driving with sleepiness.  But says when she watches TV she does get sleepy.  BMI is 35    No Known Allergies  Immunization History  Administered Date(s) Administered  . Influenza,inj,Quad PF,6+ Mos 12/21/2018  . Influenza,inj,Quad PF,6-35 Mos 01/10/2019  . Pneumococcal Polysaccharide-23 02/12/2014  . Tdap 02/12/2014    Past Medical History:  Diagnosis Date  . Acute cholecystitis 08/24/2016  . Bronchiectasis ([place])   . CKD (chronic kidney disease)   . PE (pulmonary thromboembolism) ([place]) 06/2019  . Sarcoidosis   . Sarcoidosis   . Smoker 02/12/2014    Tobacco History: Social History   Tobacco Use  Smoking Status Former Smoker  . Packs/day: 1.00  . Years: 20.00  . Pack years: 20.00  . Types: Cigarettes  . Quit date: 03/21/2015  . Years since quitting: 4.2  Smokeless Tobacco Never Used   Counseling given: Not Answered   Outpatient Medications Prior to Visit  Medication Sig Dispense Refill  . acetaminophen (TYLENOL) 500 MG tablet Take 500 mg by mouth as needed  for moderate pain.    Marland Kitchen albuterol (PROAIR HFA) 108 (90 Base) MCG/ACT inhaler Inhale 2 puffs into the lungs every 6 (six) hours as needed for wheezing or shortness of breath. 6.7 g 5  . Rivaroxaban 15 & 20 MG TBPK Follow package directions:  Take one 42m tablet by mouth twice a day. On day 22, switch to one 257mtablet once a day. Take with food. 51 each 0  . rosuvastatin (CRESTOR) 5 MG tablet Take 1 tablet (5 mg total) by mouth daily. 30 tablet 1  . Vitamin D, Ergocalciferol, (DRISDOL) 1.25 MG (50000 UNIT) CAPS capsule Take 1 capsule (50,000 Units total) by mouth every 7 (seven) days. (Patient taking differently: Take 50,000 Units by mouth every 7 (seven) days. Sundays) 12 capsule 1  . Respiratory Therapy Supplies (FLUTTER) DEVI Use flutter valve 3 times a day (Patient not taking: Reported on 06/18/2019) 1 each 0   No facility-administered medications prior to visit.     Review of Systems:   Constitutional:   No  weight loss, night sweats,  Fevers, chills, + fatigue, or  lassitude.  HEENT:   No headaches,  Difficulty swallowing,  Tooth/dental problems, or  Sore throat,                No sneezing, itching, ear ache, nasal congestion, post nasal drip,   CV:  No chest pain,  Orthopnea, PND, swelling in lower extremities, anasarca, dizziness, palpitations, syncope.   GI  No heartburn, indigestion, abdominal pain, nausea, vomiting, diarrhea, change in bowel habits, loss of appetite, bloody stools.   Resp: No shortness of breath with exertion or at rest.  No excess mucus, no productive cough,  No non-productive cough,  No coughing up of blood.  No change in color of mucus.  No wheezing.  No chest wall deformity  Skin: no rash or lesions.  GU: no dysuria, change in color of urine, no urgency or frequency.  No flank pain, no hematuria   MS:  No joint pain or swelling.  No decreased range of motion.  No back pain.    Physical Exam  BP 130/84 (BP Location: Left Arm, Cuff Size: Normal)   Pulse 78   Ht 5' 6"  (1.676 m)   Wt 223 lb (101.2 kg)   LMP 06/20/2019   SpO2 94%   BMI 35.99 kg/m   GEN: A/Ox3; pleasant , NAD, BMI 35   HEENT:  [place]/AT,   NOSE-clear, THROAT-clear, no lesions, no postnasal drip or exudate noted.   NECK:   Supple w/ fair ROM; no JVD; normal carotid impulses w/o bruits; no thyromegaly or nodules palpated; no lymphadenopathy.    RESP  Clear  P & A; w/o, wheezes/ rales/ or rhonchi. no accessory muscle use, no dullness to percussion  CARD:  RRR, no m/r/g, no peripheral edema, pulses intact, no cyanosis or clubbing.  GI:   Soft & nt; nml bowel sounds; no organomegaly or masses detected.   Musco: Warm bil, no deformities or joint swelling noted.   Neuro: alert, no focal deficits noted.    Skin: Warm, no lesions or rashes    Lab Results:  CBC    Component Value Date/Time   WBC 11.9 (H) 06/20/2019 0005   RBC 4.70 06/20/2019 0005   HGB 12.9 06/20/2019 0005   HCT 40.8 06/20/2019 0005   PLT 337 06/20/2019 0005   MCV 86.8 06/20/2019 0005   MCH 27.4 06/20/2019 0005   MCHC 31.6 06/20/2019 0005   RDW 13.5  06/20/2019 0005   LYMPHSABS 2.6 06/12/2017 1007   MONOABS 0.9 06/12/2017 1007   EOSABS 0.3 06/12/2017 1007   BASOSABS 0.1 06/12/2017 1007    BMET    Component Value Date/Time   NA 137 06/20/2019 0005   K 4.2 06/20/2019 0005   CL 105 06/20/2019 0005   CO2 25 06/20/2019 0005   GLUCOSE 107 (H) 06/20/2019 0005   BUN 11 06/20/2019 0005   CREATININE 1.68 (H) 06/20/2019 0005   CREATININE 1.20 (H) 04/22/2019 0839   CALCIUM 9.1 06/20/2019 0005   GFRNONAA 35 (L) 06/20/2019 0005   GFRNONAA 53 (L) 04/22/2019 0839   GFRAA 41 (L) 06/20/2019 0005   GFRAA 61 04/22/2019 0839    BNP No results found for: BNP  ProBNP No results found for: PROBNP  Imaging: DG Chest 2 View  Result Date: 06/18/2019 CLINICAL DATA:  Chest pain and dizziness EXAM: CHEST - 2 VIEW COMPARISON:  04/22/2018 FINDINGS: Cardiac shadow is stable. Aortic calcifications are again seen. Interstitial prominence and scattered reticulonodular opacities are again seen and stable consistent with the given clinical history of sarcoidosis. No focal infiltrate or sizable effusion is seen. No bony abnormality is noted. IMPRESSION:  Chronic changes consistent with the given clinical history of sarcoidosis stable from the prior exam. Electronically Signed   By: Inez Catalina M.D.   On: 06/18/2019 15:44   CT Angio Chest PE W and/or Wo Contrast  Result Date: 06/18/2019 CLINICAL DATA:  Dyspnea. Hemoptysis the past 3 days. Sarcoidosis. EXAM: CT ANGIOGRAPHY CHEST WITH CONTRAST TECHNIQUE: Multidetector CT imaging of the chest was performed using the standard protocol during bolus administration of intravenous contrast. Multiplanar CT image reconstructions and MIPs were obtained to evaluate the vascular anatomy. CONTRAST:  43m OMNIPAQUE IOHEXOL 350 MG/ML SOLN COMPARISON:  None. Unenhanced CT chest high-resolution dated March 06, 2018. FINDINGS: Cardiovascular: Peripheral segmental and subsegmental bilateral pulmonary emboli, including in the right apex and lung bases. No central or lobar pulmonary embolism. No right heart strain. Borderline cardiomegaly. A 3.5 cm pulmonary trunk diameter. Paucity of coronary calcification. Mediastinum/Nodes: Bulky mediastinal and hilar noncalcified lymph nodes consistent with the history of sarcoidosis. Lungs/Pleura: No pleural effusion or pneumothorax. Similar bilateral apical and lingular honeycombing and bilateral upper lobe mild traction bronchiectasis. Similar mild septal thickening. Nonspecific ground-glass opacities with wedge-shaped distributions in the left base and right middle lobe, not significantly changed. Upper Abdomen: Mild hepatomegaly. Cholecystectomy. Musculoskeletal: Mild skeletal degenerative change. Review of the MIP images confirms the above findings. I discussed critical Value/emergent results by telephone at the time of interpretation on 06/18/2019 at 7:52 pm with provider KJanetta Hora who verbally acknowledged these results. IMPRESSION: 1. Peripheral segmental and subsegmental bilateral pulmonary emboli. No right heart strain. 2. Bulky mediastinal and hilar lymph nodes and pulmonary  findings consistent with the history of sarcoidosis, possibly with an acute inflammatory or infectious exacerbation. 3. Pulmonary artery hypertension and borderline cardiomegaly. 4. Mild hepatomegaly. Electronically Signed   By: RRevonda Humphrey  On: 06/18/2019 19:55   ECHOCARDIOGRAM COMPLETE  Result Date: 06/19/2019    ECHOCARDIOGRAM REPORT   Patient Name:   KWILLINE SCHWALBEDate of Exam: 06/19/2019 Medical Rec #:  [id number]         Height:       65.0 in Accession #:    [id number]        Weight:       222.0 lb Date of Birth:  [birth year]  BSA:          2.068 m Patient Age:    38 years           BP:           132/84 mmHg Patient Gender: F                  HR:           62 bpm. Exam Location:  Inpatient Procedure: 2D Echo, Cardiac Doppler and Color Doppler Indications:    I26.02 Pulmonary embolus  History:        Patient has no prior history of Echocardiogram examinations.                 Signs/Symptoms:Dyspnea. Sarcoidosis.  Sonographer:    Roseanna Rainbow RDCS Referring Phys: [phone] [facility] BLACK  Sonographer Comments: Technically difficult study due to poor echo windows and patient is morbidly obese. Image acquisition challenging due to patient body habitus. IMPRESSIONS  1. Left ventricular ejection fraction, by estimation, is 60 to 65%. The left ventricle has normal function. The left ventricle has no regional wall motion abnormalities. There is mild left ventricular hypertrophy. Left ventricular diastolic parameters were normal.  2. Right ventricular systolic function is mildly reduced. The right ventricular size is normal. There is moderately elevated pulmonary artery systolic pressure.  3. The mitral valve is abnormal. Trivial mitral valve regurgitation.  4. The aortic valve is tricuspid. Aortic valve regurgitation is not visualized.  5. Aortic dilatation noted. There is mild dilatation of the ascending aorta measuring 39 mm. There is Moderate (Grade III) protruding plaque involving the distal aortc arch  and proximal descending aorta.  6. The inferior vena cava is normal in size with <50% respiratory variability, suggesting right atrial pressure of 8 mmHg. FINDINGS  Left Ventricle: Left ventricular ejection fraction, by estimation, is 60 to 65%. The left ventricle has normal function. The left ventricle has no regional wall motion abnormalities. The left ventricular internal cavity size was normal in size. There is  mild left ventricular hypertrophy. Left ventricular diastolic parameters were normal. Right Ventricle: The right ventricular size is normal. No increase in right ventricular wall thickness. Right ventricular systolic function is mildly reduced. There is moderately elevated pulmonary artery systolic pressure. The tricuspid regurgitant velocity is 3.50 m/s, and with an assumed right atrial pressure of 8 mmHg, the estimated right ventricular systolic pressure is 03.4 mmHg. Left Atrium: Left atrial size was normal in size. Right Atrium: Right atrial size was normal in size. Pericardium: There is no evidence of pericardial effusion. Mitral Valve: The mitral valve is abnormal. There is mild thickening of the mitral valve leaflet(s). Trivial mitral valve regurgitation. Tricuspid Valve: The tricuspid valve is grossly normal. Tricuspid valve regurgitation is mild. Aortic Valve: The aortic valve is tricuspid. Aortic valve regurgitation is not visualized. Pulmonic Valve: The pulmonic valve was normal in structure. Pulmonic valve regurgitation is not visualized. Aorta: Aortic dilatation noted. There is mild dilatation of the ascending aorta measuring 39 mm. There is moderate (Grade III) protruding plaque involving the descending aorta. Venous: The inferior vena cava is normal in size with less than 50% respiratory variability, suggesting right atrial pressure of 8 mmHg. IAS/Shunts: No atrial level shunt detected by color flow Doppler.  LEFT VENTRICLE PLAX 2D LVIDd:         4.71 cm      Diastology LVIDs:         2.74  cm  LV e' lateral:   13.40 cm/s LV PW:         1.11 cm      LV E/e' lateral: 6.8 LV IVS:        1.15 cm      LV e' medial:    9.14 cm/s LVOT diam:     2.10 cm      LV E/e' medial:  9.9 LV SV:         83 LV SV Index:   40 LVOT Area:     3.46 cm  LV Volumes (MOD) LV vol d, MOD A2C: 97.9 ml LV vol d, MOD A4C: 117.0 ml LV vol s, MOD A2C: 40.2 ml LV vol s, MOD A4C: 50.9 ml LV SV MOD A2C:     57.7 ml LV SV MOD A4C:     117.0 ml LV SV MOD BP:      62.5 ml RIGHT VENTRICLE            IVC RV S prime:     9.68 cm/s  IVC diam: 1.92 cm TAPSE (M-mode): 2.4 cm LEFT ATRIUM             Index       RIGHT ATRIUM           Index LA diam:        3.30 cm 1.60 cm/m  RA Area:     18.30 cm LA Vol (A2C):   34.0 ml 16.44 ml/m RA Volume:   56.80 ml  27.47 ml/m LA Vol (A4C):   39.5 ml 19.10 ml/m LA Biplane Vol: 37.4 ml 18.09 ml/m  AORTIC VALVE LVOT Vmax:   129.00 cm/s LVOT Vmean:  81.300 cm/s LVOT VTI:    0.240 m  AORTA Ao Root diam: 3.00 cm Ao Asc diam:  3.90 cm MITRAL VALVE               TRICUSPID VALVE MV Area (PHT): 4.21 cm    TR Peak grad:   49.0 mmHg MV Decel Time: 180 msec    TR Vmax:        350.00 cm/s MV E velocity: 90.80 cm/s MV A velocity: 81.80 cm/s  SHUNTS MV E/A ratio:  1.11        Systemic VTI:  0.24 m                            Systemic Diam: 2.10 cm Lyman Bishop MD Electronically signed by Lyman Bishop MD Signature Date/Time: 06/19/2019/3:36:12 PM    Final    VAS [place] LOWER EXTREMITY VENOUS (DVT) (ONLY [facility] & [facility])  Result Date: 06/20/2019  Lower Venous DVTStudy Indications: Edema.  Risk Factors: None identified. Comparison Study: No prior studies. Performing Technologist: Oliver Hum RVT  Examination Guidelines: A complete evaluation includes B-mode imaging, spectral Doppler, color Doppler, and power Doppler as needed of all accessible portions of each vessel. Bilateral testing is considered an integral part of a complete examination. Limited examinations for reoccurring indications may be performed as noted.  The reflux portion of the exam is performed with the patient in reverse Trendelenburg.  +---------+---------------+---------+-----------+----------+--------------+ RIGHT    CompressibilityPhasicitySpontaneityPropertiesThrombus Aging +---------+---------------+---------+-----------+----------+--------------+ CFV      Full           Yes      Yes                                 +---------+---------------+---------+-----------+----------+--------------+  SFJ      Full                                                        +---------+---------------+---------+-----------+----------+--------------+ FV Prox  Full                                                        +---------+---------------+---------+-----------+----------+--------------+ FV Mid   Full                                                        +---------+---------------+---------+-----------+----------+--------------+ FV DistalFull                                                        +---------+---------------+---------+-----------+----------+--------------+ PFV      Full                                                        +---------+---------------+---------+-----------+----------+--------------+ POP      Full           Yes      Yes                                 +---------+---------------+---------+-----------+----------+--------------+ PTV      Full                                                        +---------+---------------+---------+-----------+----------+--------------+ PERO     Full                                                        +---------+---------------+---------+-----------+----------+--------------+   +----+---------------+---------+-----------+----------+--------------+ LEFTCompressibilityPhasicitySpontaneityPropertiesThrombus Aging +----+---------------+---------+-----------+----------+--------------+ CFV Full           Yes      Yes                                  +----+---------------+---------+-----------+----------+--------------+     Summary: RIGHT: - There is no evidence of deep vein thrombosis in the lower extremity.  - No cystic structure found in the popliteal fossa.  LEFT: - No evidence of common femoral vein obstruction.  *See table(s) above for measurements and observations. Electronically signed by Monica Martinez MD on  06/20/2019 at 3:35:22 PM.    Final       PFT Results Latest Ref Rng & Units 05/26/2015  FVC-Pre L 1.78  FVC-Predicted Pre % 57  FVC-Post L 1.88  FVC-Predicted Post % 60  Pre FEV1/FVC % % 85  Post FEV1/FCV % % 88  FEV1-Pre L 1.51  FEV1-Predicted Pre % 59  FEV1-Post L 1.67  DLCO UNC% % 36  DLCO COR %Predicted % 73  TLC L 2.78  TLC % Predicted % 53  RV % Predicted % 44    No results found for: NITRICOXIDE      Assessment & Plan:   Bilateral pulmonary embolism (HCC) Unprovoked PE questionable etiology.-Unable to identify precipitating etiology .  Patient will continue on Xarelto starter pack and then transition to Xarelto 20 mg daily. We will need a follow-up 2D echo in 3 months as she had pulmonary hypertension noted on echo. Due to patient's young age and unprovoked PE will refer to hematology to help guide longevity of therapy. And further evaluate possible causes - will need hypercoagulable panel  Plan  Patient Instructions  Continue on Xarelto .  Refer to Hematology for unprovoked PE. (when available in next couple of months)  Avoid NSAIDS  2 D echo in 3 months .  Flutter valve Three times a day   Set up for Home Sleep study in 4 weeks.  Advance activity as tolerated.  Follow up in 2 months with Dr. Halford Chessman  With PFT and As needed       Bronchiectasis [facility]) Currently stable continue on pulmonary hygiene regimen  Sarcoidosis Sarcoidosis appears stable.  Continue on current regimen Check PFTs on return  Plan  Patient Instructions  Continue on Xarelto .  Refer to Hematology  for unprovoked PE. (when available in next couple of months)  Avoid NSAIDS  2 D echo in 3 months .  Flutter valve Three times a day   Set up for Home Sleep study in 4 weeks.  Advance activity as tolerated.  Follow up in 2 months with Dr. Halford Chessman  With PFT and As needed       Daytime hypersomnolence Patient has risk factors for daytime hypersomnolence including daytime sleepiness, snoring, restless sleep, BMI and pulmonary hypertension on echo.  Set up for home sleep study  Pulmonary hypertension ([place]) Elevated pulmonary artery pressures/pulmonary hypertension noted on 2D echo most likely secondary to bilateral PE However patient has underlying chronic lung disease, and risk factors for sleep apnea.  Will check home sleep study.. We will need a repeat 2D echo to follow pulmonary artery pressures in 3 months   Total patient care time 45 minutes  Maraya Gwilliam, NP 07/02/2019

## 2019-07-03 ENCOUNTER — Encounter: Payer: 59 | Admitting: Podiatry

## 2019-07-03 NOTE — Progress Notes (Signed)
Reviewed and agree with assessment/plan.   Antwoin Lackey, MD [facility] 04/06/2016, 12:24 PM Pager:  [phone]  

## 2019-07-04 ENCOUNTER — Encounter: Payer: Self-pay | Admitting: Family Medicine

## 2019-07-04 ENCOUNTER — Ambulatory Visit (INDEPENDENT_AMBULATORY_CARE_PROVIDER_SITE_OTHER): Payer: 59 | Admitting: Family Medicine

## 2019-07-04 ENCOUNTER — Other Ambulatory Visit: Payer: Self-pay

## 2019-07-04 VITALS — BP 128/84 | HR 67 | Resp 16 | Ht 65.0 in | Wt 223.1 lb

## 2019-07-04 DIAGNOSIS — Z6837 Body mass index (BMI) 37.0-37.9, adult: Secondary | ICD-10-CM

## 2019-07-04 DIAGNOSIS — I10 Essential (primary) hypertension: Secondary | ICD-10-CM | POA: Insufficient documentation

## 2019-07-04 DIAGNOSIS — I2699 Other pulmonary embolism without acute cor pulmonale: Secondary | ICD-10-CM

## 2019-07-04 DIAGNOSIS — E6609 Other obesity due to excess calories: Secondary | ICD-10-CM

## 2019-07-04 DIAGNOSIS — Z7689 Persons encountering health services in other specified circumstances: Secondary | ICD-10-CM

## 2019-07-04 DIAGNOSIS — D860 Sarcoidosis of lung: Secondary | ICD-10-CM

## 2019-07-04 DIAGNOSIS — N182 Chronic kidney disease, stage 2 (mild): Secondary | ICD-10-CM | POA: Insufficient documentation

## 2019-07-04 DIAGNOSIS — M25461 Effusion, right knee: Secondary | ICD-10-CM

## 2019-07-04 DIAGNOSIS — J479 Bronchiectasis, uncomplicated: Secondary | ICD-10-CM

## 2019-07-04 DIAGNOSIS — M25561 Pain in right knee: Secondary | ICD-10-CM

## 2019-07-04 DIAGNOSIS — E785 Hyperlipidemia, unspecified: Secondary | ICD-10-CM | POA: Insufficient documentation

## 2019-07-04 LAB — CBC
HCT: 40.1 % (ref 35.0–45.0)
Hemoglobin: 13.1 g/dL (ref 11.7–15.5)
MCH: 27.6 pg (ref 27.0–33.0)
MCHC: 32.7 g/dL (ref 32.0–36.0)
MCV: 84.4 fL (ref 80.0–100.0)
MPV: 10.2 fL (ref 7.5–12.5)
Platelets: 373 10*3/uL (ref 140–400)
RBC: 4.75 10*6/uL (ref 3.80–5.10)
RDW: 12.6 % (ref 11.0–15.0)
WBC: 11.5 10*3/uL — ABNORMAL HIGH (ref 3.8–10.8)

## 2019-07-04 LAB — COMPLETE METABOLIC PANEL WITH GFR
AG Ratio: 1.3 (calc) (ref 1.0–2.5)
ALT: 18 U/L (ref 6–29)
AST: 17 U/L (ref 10–35)
Albumin: 3.9 g/dL (ref 3.6–5.1)
Alkaline phosphatase (APISO): 89 U/L (ref 31–125)
BUN/Creatinine Ratio: 13 (calc) (ref 6–22)
BUN: 18 mg/dL (ref 7–25)
CO2: 24 mmol/L (ref 20–32)
Calcium: 9.1 mg/dL (ref 8.6–10.2)
Chloride: 105 mmol/L (ref 98–110)
Creat: 1.36 mg/dL — ABNORMAL HIGH (ref 0.50–1.10)
GFR, Est African American: 53 mL/min/{1.73_m2} — ABNORMAL LOW (ref >=60)
GFR, Est Non African American: 46 mL/min/{1.73_m2} — ABNORMAL LOW (ref >=60)
Globulin: 3 g/dL (calc) (ref 1.9–3.7)
Glucose, Bld: 88 mg/dL (ref 65–139)
Potassium: 4 mmol/L (ref 3.5–5.3)
Sodium: 137 mmol/L (ref 135–146)
Total Bilirubin: 0.5 mg/dL (ref 0.2–1.2)
Total Protein: 6.9 g/dL (ref 6.1–8.1)

## 2019-07-04 NOTE — Assessment & Plan Note (Signed)
Review of notes, labs, test and other findings from the emergency room and hospital admission. Please see note for documentation in detail. Follow-up on labs for recheck of kidney function ordered today. Referral to Ortho for knee discomfort and pain. Is being followed by pulmonology now.  Referral to hematology via them. Additionally is going to be seeing nephrology here soon.

## 2019-07-04 NOTE — Assessment & Plan Note (Signed)
Currently doing well continue pulmonary hygiene with use of flutter valve.

## 2019-07-04 NOTE — Assessment & Plan Note (Signed)
Has close follow-up with pulmonology.  Unprovoked PE unable to identify a participating factor.  Continue Xarelto and transition to Xarelto 20 mg daily after starter pack is complete.  As per pulmonology she will have a 2D echo in 3 months to assess pulmonary hypertension.  She has been referred to hematology to help guide longevity of therapy and needs.  Possible need for hypercoagulable panel.  Additionally she will be getting a home sleep study in 4 weeks.  She will have follow-up with Dr. Woody Seller within 2 months with pulmonary function tests as needed.

## 2019-07-04 NOTE — Patient Instructions (Signed)
I appreciate the opportunity to provide you with care for your health and wellness. Today we discussed: recent hospitalization   Follow up: 8 weeks  Labs today  Referral to Ortho  Please continue to practice social distancing to keep you, your family, and our community safe.  If you must go out, please wear a mask and practice good handwashing.  It was a pleasure to see you and I look forward to continuing to work together on your health and well-being. Please do not hesitate to call the office if you need care or have questions about your care.  Have a wonderful day and week. With Gratitude, Tereasa Coop, DNP, AGNP-BC

## 2019-07-04 NOTE — Assessment & Plan Note (Signed)
Possible need for low-dose BP medication to help with kidney function overall.  As of right now she will be following up with kidney provider on April 9 appreciate collaboration in her care.  Will be ordering labs as well to check kidney function to see if this returned to normal.

## 2019-07-04 NOTE — Progress Notes (Signed)
Subjective:  Patient ID: Hailey Ross, female    DOB: 1970-05-08  Age: 49 y.o. MRN: [id number]  CC:  Chief Complaint  Patient presents with  . Hospitalization Follow-up    embolism. D/c on 03/11  . Leg Pain    right leg pain and swelling       HPI  HPI Hailey Ross a very pleasant 49 y.o.femalewith medical history significant forsarcoidosis with bronchiectasis and hyperlipidemia, HTN, obesity who presented 3/9 to the ED for evaluation of chest tightness and cough productive of bloody sputum.  Patient reported about 1 month of intermittent lightheadedness which occured with or without activity. She was seen at urgent care and by myself without obvious etiology. She had not lost consciousness or fallen from the episodes. She stated to the ED provider she has a chronic cough due to her sarcoidosis usually productive of mucus. On 3/9 while at work she coughed up a small blood clot and then had a couple more episodes of spottyblood in her sputum. She also reported recent trickling nosebleeds. She denied any other obvious bleeding except for her current menses which was unchanged from regular. She had intermittent central chest discomfort without radiation or associated with activity. She denied any shortness of breath, nausea, vomiting, abdominal pain, dysuria when in ED.  She reported recent pain in the back of her right lower extremity at the calf and behind the knee. She denied any personal history of previous blood clots or any family history of blood clots as far she knows. She is a former smoker and quit smoking 5 years ago. She had not had any recent surgeries.  ED Course: Vitals stable, however her SPO2 was 96% at rest on RA but dropped to 77% with ambulating to the bathroom, she improved to 91% after several mins of rest.Labs show WBC 11.0, hemoglobin 13.0, platelets 339,000, sodium 138, potassium 3.8, bicarb 22, BUN 10, creatinine 1.23,  high-sensitivity troponin I 27 >> 28, i-STAT beta-hCG undetectable.  2 view chest x-ray shows chronic interstitial scattered reticulonodular opacities stable from prior exam.  CTA chest PE study shows peripheral segmental and subsegmental bilateral pulmonary emboli without evidence of right heart strain.  Bulky mediastinal and hilar lymph nodes are noted.    Patient was started on IV heparin and the hospitalist service was consulted to admit for further evaluation and management.  Hospital Course:  Acute respiratory failure treated with oxygen, was not oxygen dependent return to baseline is being followed by pulmonology now.  Continued on inhalers nebulizers as needed.  As per pulmonary, pulmonary hygiene with use of flutter valve is stressed.  Bilateral pulmonary embolism noted. She reported that she had been more sedentary last several months in her job. She developed pain in her lower right leg about a month ago. She is a former smoker no history of blood clots. No recent surgery. Echo with EF 60% mild LVH, LE dopplar no dvt.   Will have follow-up echo in 2 months.  Secondary to pulmonary hypertension finding.   Sarcoidosis with bronchiectasis. Chronic and stable according to the patient. No longer on prednisone. Home medications include an inhaler. CT notes findings consistent with history of sarcoidosis possibly with an acute inflammatory or infectious exacerbation.  Is followed by pulmonology now.    AKI superimposed on  CKD stage 2.  Patient reported that she has been told she has "kidney issues" and she has appointment with nephrology in 3 weeks. Creatinine 1.2 on admission and 1.68  at discharge. Likely related to decreased oral intake. BUN within limits of normal.  Knee pain: 2 months, right knee pain some lower leg.  Described as achiness and discomfort especially with pressure on the leg with prolonged sitting.  Nothing except walking around to help relieve it.  Pain is usually  a 6-7 out of 10.  Does not hurt to touch.  Did have a rule out ultrasound secondary to blood clots finding in this was negative in the hospital.  Denies have any injury or trauma to the knee.  Denies having any trouble with a giving out or locking in place.  Is open to seeing orthopedics.  Today patient denies signs and symptoms of COVID 19 infection including fever, chills, cough, shortness of breath, and headache. Past Medical, Surgical, Social History, Allergies, and Medications have been Reviewed.   Past Medical History:  Diagnosis Date  . Acute cholecystitis 08/24/2016  . Bronchiectasis ([facility])   . CKD (chronic kidney disease)   . PE (pulmonary thromboembolism) ([facility]) 06/2019  . Sarcoidosis   . Sarcoidosis   . Smoker 02/12/2014    Current Meds  Medication Sig  . acetaminophen (TYLENOL) 500 MG tablet Take 500 mg by mouth as needed for moderate pain.  Marland Kitchen albuterol (PROAIR HFA) 108 (90 Base) MCG/ACT inhaler Inhale 2 puffs into the lungs every 6 (six) hours as needed for wheezing or shortness of breath.  . rivaroxaban (XARELTO) 20 MG TABS tablet Take 1 tablet (20 mg total) by mouth daily with supper.  . Rivaroxaban 15 & 20 MG TBPK Follow package directions: Take one 15mg  tablet by mouth twice a day. On day 22, switch to one 20mg  tablet once a day. Take with food.  . rosuvastatin (CRESTOR) 5 MG tablet Take 1 tablet (5 mg total) by mouth daily.  . Vitamin D, Ergocalciferol, (DRISDOL) 1.25 MG (50000 UNIT) CAPS capsule Take 1 capsule (50,000 Units total) by mouth every 7 (seven) days. (Patient taking differently: Take 50,000 Units by mouth every 7 (seven) days. Sundays)    ROS:  Review of Systems  Constitutional: Negative.   HENT: Negative.   Eyes: Negative.   Respiratory: Negative.   Cardiovascular: Negative.   Gastrointestinal: Negative.   Genitourinary: Negative.   Musculoskeletal: Negative.        Knee pain and swelling   Skin: Negative.   Neurological: Negative.     Endo/Heme/Allergies: Negative.   Psychiatric/Behavioral: Negative.   All other systems reviewed and are negative.    Objective:   Today's Vitals: Pulse 67   Resp 16   Ht 5\' 5"  (1.651 m)   Wt 223 lb 1.9 oz (101.2 kg)   LMP 06/20/2019   SpO2 93%   BMI 37.13 kg/m  Vitals with BMI 07/04/2019 07/02/2019 06/20/2019  Height 5\' 5"  5\' 6"  -  Weight 223 lbs 2 oz 223 lbs -  BMI 28.41 32.44 -  Systolic - 010 272  Diastolic - 84 88  Pulse 67 78 72     Physical Exam Vitals and nursing note reviewed.  Constitutional:      Appearance: Normal appearance. She is well-developed and well-groomed. She is obese.  HENT:     Head: Normocephalic and atraumatic.     Right Ear: External ear normal.     Left Ear: External ear normal.     Mouth/Throat:     Comments: Mask in place  Eyes:     General:        Right eye: No discharge.  Left eye: No discharge.     Conjunctiva/sclera: Conjunctivae normal.  Cardiovascular:     Rate and Rhythm: Normal rate and regular rhythm.     Pulses: Normal pulses.     Heart sounds: Normal heart sounds.  Pulmonary:     Effort: Pulmonary effort is normal.     Breath sounds: Normal breath sounds.  Musculoskeletal:        General: Normal range of motion.     Cervical back: Normal range of motion and neck supple.     Right knee: Swelling and deformity present. No erythema, ecchymosis or bony tenderness. No tenderness.     Left knee: Normal.  Skin:    General: Skin is warm.  Neurological:     General: No focal deficit present.     Mental Status: She is alert and oriented to person, place, and time.  Psychiatric:        Attention and Perception: Attention normal.        Mood and Affect: Mood normal.        Speech: Speech normal.        Behavior: Behavior normal. Behavior is cooperative.        Thought Content: Thought content normal.        Cognition and Memory: Cognition normal.        Judgment: Judgment normal.     Assessment   1. Encounter for  support and coordination of transition of care   2. Bilateral pulmonary embolism (HCC)   3. Sarcoidosis of lung (HCC)   4. Essential hypertension   5. Pain and swelling of right knee   6. Class 2 obesity due to excess calories with body mass index (BMI) of 37.0 to 37.9 in adult, unspecified whether serious comorbidity present   7. Stage 2 chronic kidney disease     Tests ordered Orders Placed This Encounter  Procedures  . COMPLETE METABOLIC PANEL WITH GFR  . CBC  . Ambulatory referral to Orthopedic Surgery     Plan: Please see assessment and plan per problem list above.   No orders of the defined types were placed in this encounter.   Patient to follow-up in 8 weeks  Freddy Finner, NP

## 2019-07-04 NOTE — Assessment & Plan Note (Signed)
Deteriorated, Hailey Ross is re-educated about the importance of exercise daily to help with weight management. A minumum of 30 minutes daily is recommended. Additionally, importance of healthy food choices  with portion control discussed.   Wt Readings from Last 3 Encounters:  07/04/19 223 lb 1.9 oz (101.2 kg)  07/02/19 223 lb (101.2 kg)  06/20/19 218 lb 8 oz (99.1 kg)

## 2019-07-04 NOTE — Assessment & Plan Note (Signed)
Unsure cause of right knee discomfort and swelling.  Ultrasound in the hospital ruled out blood clot.  But she has a pretty significant amount of swelling around the right knee joint.  Referral to Ortho for further additional work-up needed

## 2019-07-04 NOTE — Assessment & Plan Note (Signed)
Stable is followed by pulmonology now.

## 2019-07-04 NOTE — Assessment & Plan Note (Signed)
Kidney function had decreased will be getting updated labs to see if that function has returned.  Additionally she has a follow-up appointment with nephrology coming up soon.

## 2019-07-08 ENCOUNTER — Other Ambulatory Visit: Payer: Self-pay | Admitting: *Deleted

## 2019-07-08 ENCOUNTER — Telehealth: Payer: Self-pay | Admitting: Adult Health

## 2019-07-08 DIAGNOSIS — E785 Hyperlipidemia, unspecified: Secondary | ICD-10-CM

## 2019-07-08 DIAGNOSIS — E559 Vitamin D deficiency, unspecified: Secondary | ICD-10-CM

## 2019-07-08 MED ORDER — RIVAROXABAN 20 MG PO TABS: 20.0000 mg | ORAL_TABLET | Freq: Every day | ORAL | 5 refills | Status: DC

## 2019-07-08 MED ORDER — VITAMIN D (ERGOCALCIFEROL) 1.25 MG (50000 UNIT) PO CAPS: 50000.0000 [IU] | ORAL_CAPSULE | ORAL | 1 refills | Status: DC

## 2019-07-08 MED ORDER — ROSUVASTATIN CALCIUM 5 MG PO TABS: 5.0000 mg | ORAL_TABLET | Freq: Every day | ORAL | 1 refills | Status: DC

## 2019-07-08 NOTE — Telephone Encounter (Signed)
Called [facility] which was the original pharmacy that pt's xarelto was sent to and had them cancel the rx there.  Sent rx for xarelto to preferred pharmacy stated by pt. Called and spoke with pt letting her know this had been done and pt verbalized understanding. Nothing further needed.

## 2019-07-09 ENCOUNTER — Encounter: Payer: Self-pay | Admitting: *Deleted

## 2019-07-09 ENCOUNTER — Encounter (HOSPITAL_COMMUNITY): Payer: Self-pay

## 2019-07-09 ENCOUNTER — Encounter: Payer: Self-pay | Admitting: Orthopaedic Surgery

## 2019-07-09 ENCOUNTER — Ambulatory Visit: Payer: 59

## 2019-07-09 ENCOUNTER — Other Ambulatory Visit: Payer: Self-pay

## 2019-07-09 ENCOUNTER — Telehealth: Payer: Self-pay | Admitting: *Deleted

## 2019-07-09 ENCOUNTER — Ambulatory Visit (INDEPENDENT_AMBULATORY_CARE_PROVIDER_SITE_OTHER): Payer: 59 | Admitting: Orthopaedic Surgery

## 2019-07-09 VITALS — BP 126/87 | HR 71 | Ht 65.0 in | Wt 223.0 lb

## 2019-07-09 DIAGNOSIS — G8929 Other chronic pain: Secondary | ICD-10-CM

## 2019-07-09 DIAGNOSIS — M25561 Pain in right knee: Secondary | ICD-10-CM

## 2019-07-09 NOTE — Telephone Encounter (Signed)
Pt was seen by ortho today and they told her to see if Hailey Ross would extend her work note for two more weeks. Let her know this is usually done by ortho if they feel the need for her to be out longer. She wanted to see if it could be extended by [facility]

## 2019-07-09 NOTE — Progress Notes (Signed)
Subjective:    Patient ID: Hailey Ross, female    DOB: September 28, 1970, 49 y.o.   MRN: [id number]  HPI She has pain of the right knee for several months not getting any better.  She has swelling and popping but no giving way. She has more pain lateral and posteriorly.  She is on Xarelto for blood clots in the past.  She has no trauma, no redness.  The knee just hurts.  She has no weakness.  She has taken Tylenol and used ice.  I have read the notes from [facility].   Review of Systems  Constitutional: Positive for activity change.  Musculoskeletal: Positive for arthralgias, gait problem and joint swelling.  All other systems reviewed and are negative.  For Review of Systems, all other systems reviewed and are negative.  The following is a summary of the past history medically, past history surgically, known current medicines, social history and family history.  This information is gathered electronically by the computer from prior information and documentation.  I review this each visit and have found including this information at this point in the chart is beneficial and informative.   Past Medical History:  Diagnosis Date  . Acute cholecystitis 08/24/2016  . Acute kidney injury superimposed on chronic kidney disease (HCC) 06/20/2019  . Acute respiratory failure with hypoxia (HCC) 06/19/2019  . Annual visit for general adult medical examination with abnormal findings 04/16/2019  . Bronchiectasis (HCC)   . CKD (chronic kidney disease)   . Lightheadedness 05/10/2019  . PE (pulmonary thromboembolism) (HCC) 06/2019  . Pre-syncope 04/16/2019  . Sarcoidosis   . Sarcoidosis   . Smoker 02/12/2014    Past Surgical History:  Procedure Laterality Date  . CHOLECYSTECTOMY N/A 08/25/2016   Procedure: LAPAROSCOPIC CHOLECYSTECTOMY;  Surgeon: Abigail Miyamoto, MD;  Location: [facility] OR;  Service: General;  Laterality: N/A;  . TUBAL LIGATION  02/1993    Current Outpatient Medications on  File Prior to Visit  Medication Sig Dispense Refill  . acetaminophen (TYLENOL) 500 MG tablet Take 500 mg by mouth as needed for moderate pain.    Marland Kitchen albuterol (PROAIR HFA) 108 (90 Base) MCG/ACT inhaler Inhale 2 puffs into the lungs every 6 (six) hours as needed for wheezing or shortness of breath. 6.7 g 5  . rivaroxaban (XARELTO) 20 MG TABS tablet Take 1 tablet (20 mg total) by mouth daily with supper. 30 tablet 5  . Rivaroxaban 15 & 20 MG TBPK Follow package directions: Take one 15mg  tablet by mouth twice a day. On day 22, switch to one 20mg  tablet once a day. Take with food. 51 each 0  . rosuvastatin (CRESTOR) 5 MG tablet Take 1 tablet (5 mg total) by mouth daily. 30 tablet 1  . Vitamin D, Ergocalciferol, (DRISDOL) 1.25 MG (50000 UNIT) CAPS capsule Take 1 capsule (50,000 Units total) by mouth every 7 (seven) days. 12 capsule 1   No current facility-administered medications on file prior to visit.    Social History   Socioeconomic History  . Marital status: Single    Spouse name: Not on file  . Number of children: 3  . Years of education: Not on file  . Highest education level: High school graduate  Occupational History  . Occupation: (PRL)  Tobacco Use  . Smoking status: Former Smoker    Packs/day: 1.00    Years: 20.00    Pack years: 20.00    Types: Cigarettes    Quit date: 03/21/2015  Years since quitting: 4.3  . Smokeless tobacco: Never Used  Substance and Sexual Activity  . Alcohol use: Yes    Alcohol/week: 0.0 standard drinks    Comment: Rare  . Drug use: No  . Sexual activity: Yes    Birth control/protection: Surgical  Other Topics Concern  . Not on file  Social History Narrative   Live with partners [name]-19 years   3 children.    1-[name] (girl) 27 at home with her: 2 grandchildren in her home as well    [name]- 70 expecting   [name]-25      Enjoy: read, play games on tablet, grandkids      Diet: Veggies, does not eat a lot of fried foods,  enjoys chicken and fruit   Caffeine: sodas and coffee (with sugar)   Water: Does not drink a lot at all      Wears seat beat   Does not wear sunscreen   Smoke and carbon monoxide detectors   Does not use phone while driving    Social Determinants of Health   Financial Resource Strain: Low Risk   . Difficulty of Paying Living Expenses: Not hard at all  Food Insecurity: No Food Insecurity  . Worried About Charity fundraiser in the Last Year: Never true  . Ran Out of Food in the Last Year: Never true  Transportation Needs: No Transportation Needs  . Lack of Transportation (Medical): No  . Lack of Transportation (Non-Medical): No  Physical Activity: Sufficiently Active  . Days of Exercise per Week: 7 days  . Minutes of Exercise per Session: 60 min  Stress: No Stress Concern Present  . Feeling of Stress : Not at all  Social Connections: Somewhat Isolated  . Frequency of Communication with Friends and Family: More than three times a week  . Frequency of Social Gatherings with Friends and Family: Once a week  . Attends Religious Services: More than 4 times per year  . Active Member of Clubs or Organizations: No  . Attends Archivist Meetings: Never  . Marital Status: Never married  Intimate Partner Violence: Not At Risk  . Fear of Current or Ex-Partner: No  . Emotionally Abused: No  . Physically Abused: No  . Sexually Abused: No    Family History  Problem Relation Age of Onset  . Hypertension Mother   . Cancer Mother     BP 126/87   Pulse 71   Ht 5\' 5"  (1.651 m)   Wt 223 lb (101.2 kg)   LMP 06/20/2019   BMI 37.11 kg/m   Body mass index is 37.11 kg/m.     Objective:   Physical Exam Vitals and nursing note reviewed.  Constitutional:      Appearance: She is well-developed.  HENT:     Head: Normocephalic and atraumatic.  Eyes:     Conjunctiva/sclera: Conjunctivae normal.     Pupils: Pupils are equal, round, and reactive to light.  Cardiovascular:      Rate and Rhythm: Normal rate and regular rhythm.  Pulmonary:     Effort: Pulmonary effort is normal.  Abdominal:     Palpations: Abdomen is soft.  Musculoskeletal:     Cervical back: Normal range of motion and neck supple.       Legs:  Skin:    General: Skin is warm and dry.  Neurological:     Mental Status: She is alert and oriented to person, place, and time.     Cranial  Nerves: No cranial nerve deficit.     Motor: No abnormal muscle tone.     Coordination: Coordination normal.     Deep Tendon Reflexes: Reflexes are normal and symmetric. Reflexes normal.  Psychiatric:        Behavior: Behavior normal.        Thought Content: Thought content normal.        Judgment: Judgment normal.   X-rays were done of the right knee, reported separately.        Assessment & Plan:   Encounter Diagnosis  Name Primary?  . Chronic pain of right knee Yes   PROCEDURE NOTE:  The patient requests injections of the right knee , verbal consent was obtained.  The right knee was prepped appropriately after time out was performed.   Sterile technique was observed and injection of 1 cc of Depo-Medrol 40 mg with several cc's of plain xylocaine. Anesthesia was provided by ethyl chloride and a 20-gauge needle was used to inject the knee area. The injection was tolerated well.  A band aid dressing was applied.  The patient was advised to apply ice later today and tomorrow to the injection sight as needed.  I injected the knee as she cannot take NSAIDs.  I was unable to aspirate any fluid.  Return in two weeks.  Consider MRI if not improved.  Call if any problem.  Precautions discussed.   Electronically Signed Darreld Mclean, MD 3/30/202110:24 AM

## 2019-07-09 NOTE — Telephone Encounter (Signed)
Pt written work note for 07-04-19 to 07-23-19 when she goes to ortho advised next note would come from them with verbal understanding

## 2019-07-10 ENCOUNTER — Encounter (HOSPITAL_COMMUNITY): Payer: Self-pay | Admitting: Hematology

## 2019-07-10 ENCOUNTER — Inpatient Hospital Stay (HOSPITAL_COMMUNITY): Payer: 59

## 2019-07-10 ENCOUNTER — Inpatient Hospital Stay (HOSPITAL_COMMUNITY): Payer: 59 | Attending: Hematology | Admitting: Hematology

## 2019-07-10 VITALS — BP 112/80 | HR 74 | Temp 97.1°F | Resp 19 | Ht 65.0 in | Wt 219.1 lb

## 2019-07-10 DIAGNOSIS — Z7901 Long term (current) use of anticoagulants: Secondary | ICD-10-CM | POA: Insufficient documentation

## 2019-07-10 DIAGNOSIS — D869 Sarcoidosis, unspecified: Secondary | ICD-10-CM | POA: Diagnosis not present

## 2019-07-10 DIAGNOSIS — Z79899 Other long term (current) drug therapy: Secondary | ICD-10-CM | POA: Insufficient documentation

## 2019-07-10 DIAGNOSIS — I2699 Other pulmonary embolism without acute cor pulmonale: Secondary | ICD-10-CM

## 2019-07-10 LAB — LACTATE DEHYDROGENASE: LDH: 194 U/L — ABNORMAL HIGH (ref 98–192)

## 2019-07-10 NOTE — Progress Notes (Signed)
CONSULT NOTE  Patient Care Team: Freddy Finner, NP as PCP - General (Family Medicine) Doreatha Massed, MD as Consulting Physician (Hematology)  CHIEF COMPLAINTS/PURPOSE OF CONSULTATION:  Unprovoked bilateral pulmonary embolism.  HISTORY OF PRESENTING ILLNESS:  Hailey Ross 49 y.o. female is seen in consultation today at the request of Tereasa Coop, NP for further work-up of bilateral pulmonary embolism and duration of anticoagulation.  She has been feeling lightheaded since beginning of the year associated with right posterior knee swelling and pain.  As her lightheadedness gotten worse, she went to the ER on 06/18/2019.  CT chest angiogram showed peripheral segmental and subsegmental bilateral pulmonary emboli with no right heart strain.  She was hospitalized and was started on heparin followed by Xarelto.  Bulky mediastinal and hilar lymph nodes and pulmonary findings consistent with history of sarcoidosis was found.  Bilateral lower extremity Doppler on the same day was negative for DVT.  She never had prior history of deep vein thrombosis.  No prior history of oral contraceptive pill use.  She quit smoking 5 years ago and smoked 1 pack/day for 22 years.  She has been active prior to this incident and was doing a full-time job.  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  3 Covid, she was always on her feet.  Never had a history of miscarriages.  She has 3 children.  No family history of deep vein thrombosis.  Mother had breast cancer.  She was diagnosed with sarcoidosis in 2018.  Initially treated with prednisone for a year.  Now she has been using an inhaler for a year.  Appetite and energy levels are 100%.  Numbness in the hands has been stable.  MEDICAL HISTORY:  Past Medical History:  Diagnosis Date  . Acute cholecystitis 08/24/2016  . Acute kidney injury superimposed on chronic kidney disease (HCC) 06/20/2019  . Acute respiratory failure  with hypoxia (HCC) 06/19/2019  . Annual visit for general adult medical examination with abnormal findings 04/16/2019  . Bronchiectasis (HCC)   . CKD (chronic kidney disease)   . Lightheadedness 05/10/2019  . PE (pulmonary thromboembolism) (HCC) 06/2019  . Pre-syncope 04/16/2019  . Sarcoidosis   . Sarcoidosis   . Smoker 02/12/2014    SURGICAL HISTORY: Past Surgical History:  Procedure Laterality Date  . CHOLECYSTECTOMY N/A 08/25/2016   Procedure: LAPAROSCOPIC CHOLECYSTECTOMY;  Surgeon: Abigail Miyamoto, MD;  Location: [facility] OR;  Service: General;  Laterality: N/A;  . TUBAL LIGATION  02/1993    SOCIAL HISTORY: Social History   Socioeconomic History  . Marital status: Single    Spouse name: Not on file  . Number of children: 3  . Years of education: Not on file  . Highest education level: High school graduate  Occupational History  . Occupation: Administrator, [employer] ([employer])  Tobacco Use  . Smoking status: Former Smoker    Packs/day: 1.00    Years: 20.00    Pack years: 20.00    Types: Cigarettes    Quit date: 03/21/2015    Years since quitting: 4.3  . Smokeless tobacco: Never Used  Substance and Sexual Activity  . Alcohol use: Not Currently    Alcohol/week: 0.0 standard drinks    Comment: Rare  . Drug use: No  . Sexual activity: Yes    Birth control/protection: Surgical  Other Topics Concern  . Not on file  Social History Narrative   Live with partners [name]-19 years   3 children.  1-[name] (girl) 27 at home with her: 2 grandchildren in her home as well    [name]- 26 expecting   [name]-25      Enjoy: read, play games on tablet, grandkids      Diet: Veggies, does not eat a lot of fried foods, enjoys chicken and fruit   Caffeine: sodas and coffee (with sugar)   Water: Does not drink a lot at all      Wears seat beat   Does not wear sunscreen   Smoke and carbon monoxide detectors   Does not use phone while driving    Social Determinants of Health   Financial Resource  Strain: Low Risk   . Difficulty of Paying Living Expenses: Not hard at all  Food Insecurity: No Food Insecurity  . Worried About Programme researcher, broadcasting/film/video in the Last Year: Never true  . Ran Out of Food in the Last Year: Never true  Transportation Needs: No Transportation Needs  . Lack of Transportation (Medical): No  . Lack of Transportation (Non-Medical): No  Physical Activity: Sufficiently Active  . Days of Exercise per Week: 7 days  . Minutes of Exercise per Session: 60 min  Stress: No Stress Concern Present  . Feeling of Stress : Not at all  Social Connections: Somewhat Isolated  . Frequency of Communication with Friends and Family: More than three times a week  . Frequency of Social Gatherings with Friends and Family: Once a week  . Attends Religious Services: More than 4 times per year  . Active Member of Clubs or Organizations: No  . Attends Banker Meetings: Never  . Marital Status: Never married  Intimate Partner Violence: Not At Risk  . Fear of Current or Ex-Partner: No  . Emotionally Abused: No  . Physically Abused: No  . Sexually Abused: No    FAMILY HISTORY: Family History  Problem Relation Age of Onset  . Hypertension Mother   . Cancer Mother     ALLERGIES:  has No Known Allergies.  MEDICATIONS:  Current Outpatient Medications  Medication Sig Dispense Refill  . acetaminophen (TYLENOL) 500 MG tablet Take 500 mg by mouth as needed for moderate pain.    Marland Kitchen albuterol (PROAIR HFA) 108 (90 Base) MCG/ACT inhaler Inhale 2 puffs into the lungs every 6 (six) hours as needed for wheezing or shortness of breath. (Patient not taking: Reported on 07/09/2019) 6.7 g 5  . rivaroxaban (XARELTO) 20 MG TABS tablet Take 1 tablet (20 mg total) by mouth daily with supper. 30 tablet 5  . rosuvastatin (CRESTOR) 5 MG tablet Take 1 tablet (5 mg total) by mouth daily. 30 tablet 1  . Vitamin D, Ergocalciferol, (DRISDOL) 1.25 MG (50000 UNIT) CAPS capsule Take 1 capsule (50,000 Units  total) by mouth every 7 (seven) days. 12 capsule 1   No current facility-administered medications for this visit.    REVIEW OF SYSTEMS:   Constitutional: Denies fevers, chills or abnormal night sweats Eyes: Denies blurriness of vision, double vision or watery eyes Ears, nose, mouth, throat, and face: Denies mucositis or sore throat Respiratory: Positive for cough and shortness of breath on exertion. Cardiovascular: Denies palpitation, chest discomfort or lower extremity swelling Gastrointestinal:  Denies nausea, heartburn or change in bowel habits Skin: Denies abnormal skin rashes Lymphatics: Denies new lymphadenopathy or easy bruising Neurological:Denies numbness, tingling or new weaknesses Behavioral/Psych: Mood is stable, no new changes  All other systems were reviewed with the patient and are negative.  PHYSICAL EXAMINATION: ECOG PERFORMANCE  STATUS: 0 - Asymptomatic  Vitals:   07/10/19 1302  BP: 112/80  Pulse: 74  Resp: 19  Temp: (!) 97.1 F (36.2 C)  SpO2: 92%   Filed Weights   07/10/19 1302  Weight: 219 lb 1.6 oz (99.4 kg)    GENERAL:alert, no distress and comfortable SKIN: skin color, texture, turgor are normal, no rashes or significant lesions EYES: normal, conjunctiva are pink and non-injected, sclera clear OROPHARYNX:no exudate, no erythema and lips, buccal mucosa, and tongue normal  NECK: supple, thyroid normal size, non-tender, without nodularity LYMPH:  no palpable lymphadenopathy in the cervical, axillary or inguinal LUNGS: clear to auscultation and percussion with normal breathing effort HEART: regular rate & rhythm and no murmurs and no lower extremity edema ABDOMEN:abdomen soft, non-tender and normal bowel sounds Musculoskeletal:no cyanosis of digits and no clubbing  PSYCH: alert & oriented x 3 with fluent speech NEURO: no focal motor/sensory deficits  LABORATORY DATA:  I have reviewed the data as listed Recent Results (from the past 2160 hour(s))   CBC     Status: Abnormal   Collection Time: 04/22/19  8:39 AM  Result Value Ref Range   WBC 11.8 (H) 3.8 - 10.8 Thousand/uL   RBC 4.81 3.80 - 5.10 Million/uL   Hemoglobin 13.3 11.7 - 15.5 g/dL   HCT 42.6 83.4 - 19.6 %   MCV 84.0 80.0 - 100.0 fL   MCH 27.7 27.0 - 33.0 pg   MCHC 32.9 32.0 - 36.0 g/dL   RDW 22.2 97.9 - 89.2 %   Platelets 376 140 - 400 Thousand/uL   MPV 10.1 7.5 - 12.5 fL  COMPLETE METABOLIC PANEL WITH GFR     Status: Abnormal   Collection Time: 04/22/19  8:39 AM  Result Value Ref Range   Glucose, Bld 86 65 - 99 mg/dL    Comment: .            Fasting reference interval .    BUN 15 7 - 25 mg/dL   Creat 1.19 (H) 4.17 - 1.10 mg/dL   GFR, Est Non African American 53 (L) > OR = 60 mL/min/1.14m2   GFR, Est African American 61 > OR = 60 mL/min/1.60m2   BUN/Creatinine Ratio 13 6 - 22 (calc)   Sodium 141 135 - 146 mmol/L   Potassium 4.1 3.5 - 5.3 mmol/L   Chloride 105 98 - 110 mmol/L   CO2 28 20 - 32 mmol/L   Calcium 9.3 8.6 - 10.2 mg/dL   Total Protein 7.1 6.1 - 8.1 g/dL   Albumin 4.0 3.6 - 5.1 g/dL   Globulin 3.1 1.9 - 3.7 g/dL (calc)   AG Ratio 1.3 1.0 - 2.5 (calc)   Total Bilirubin 0.6 0.2 - 1.2 mg/dL   Alkaline phosphatase (APISO) 92 31 - 125 U/L   AST 24 10 - 35 U/L   ALT 15 6 - 29 U/L  Hemoglobin A1c     Status: Abnormal   Collection Time: 04/22/19  8:39 AM  Result Value Ref Range   Hgb A1c MFr Bld 5.7 (H) <5.7 % of total Hgb    Comment: For someone without known diabetes, a hemoglobin  A1c value between 5.7% and 6.4% is consistent with prediabetes and should be confirmed with a  follow-up test. . For someone with known diabetes, a value <7% indicates that their diabetes is well controlled. A1c targets should be individualized based on duration of diabetes, age, comorbid conditions, and other considerations. . This assay result is consistent with  an increased risk of diabetes. . Currently, no consensus exists regarding use of hemoglobin A1c for  diagnosis of diabetes for children. .    Mean Plasma Glucose 117 (calc)   eAG (mmol/L) 6.5 (calc)  Lipid panel     Status: Abnormal   Collection Time: 04/22/19  8:39 AM  Result Value Ref Range   Cholesterol 230 (H) <200 mg/dL   HDL 58 > OR = 50 mg/dL   Triglycerides 93 <147 mg/dL   LDL Cholesterol (Calc) 152 (H) mg/dL (calc)    Comment: Reference range: <100 . Desirable range <100 mg/dL for primary prevention;   <70 mg/dL for patients with CHD or diabetic patients  with > or = 2 CHD risk factors. Marland Kitchen LDL-C is now calculated using the Martin-Hopkins  calculation, which is a validated novel method providing  better accuracy than the Friedewald equation in the  estimation of LDL-C.  Horald Pollen et al. Lenox Ahr. 8295;621(30): 2061-2068  (http://education.[facility].com/faq/FAQ164)    Total CHOL/HDL Ratio 4.0 <5.0 (calc)   Non-HDL Cholesterol (Calc) 172 (H) <130 mg/dL (calc)    Comment: For patients with diabetes plus 1 major ASCVD risk  factor, treating to a non-HDL-C goal of <100 mg/dL  (LDL-C of <86 mg/dL) is considered a therapeutic  option.   VITAMIN D 25 Hydroxy     Status: Abnormal   Collection Time: 04/22/19  8:39 AM  Result Value Ref Range   Vit D, 25-Hydroxy 17 (L) 30 - 100 ng/mL    Comment: Vitamin D Status         25-OH Vitamin D: . Deficiency:                    <20 ng/mL Insufficiency:             20 - 29 ng/mL Optimal:                 > or = 30 ng/mL . For 25-OH Vitamin D testing on patients on  D2-supplementation and patients for whom quantitation  of D2 and D3 fractions is required, the QuestAssureD(TM) 25-OH VIT D, (D2,D3), LC/MS/MS is recommended: order  code 57846 (patients >47yrs). See Note 1 . Note 1 . For additional information, please refer to  http://education.[facility].com/faq/FAQ199  (This link is being provided for informational/ educational purposes only.)   TSH     Status: None   Collection Time: 04/22/19  8:39 AM  Result Value  Ref Range   TSH 1.71 mIU/L    Comment:           Reference Range .           > or = 20 Years  0.40-4.50 .                Pregnancy Ranges           First trimester    0.26-2.66           Second trimester   0.55-2.73           Third trimester    0.43-2.91   Basic metabolic panel     Status: Abnormal   Collection Time: 06/18/19  3:22 PM  Result Value Ref Range   Sodium 138 135 - 145 mmol/L   Potassium 3.8 3.5 - 5.1 mmol/L   Chloride 107 98 - 111 mmol/L   CO2 22 22 - 32 mmol/L   Glucose, Bld 138 (H) 70 - 99 mg/dL    Comment: Glucose reference range applies  only to samples taken after fasting for at least 8 hours.   BUN 10 6 - 20 mg/dL   Creatinine, Ser 6.961.23 (H) 0.44 - 1.00 mg/dL   Calcium 8.7 (L) 8.9 - 10.3 mg/dL   GFR calc non Af Amer 51 (L) >60 mL/min   GFR calc Af Amer 60 (L) >60 mL/min   Anion gap 9 5 - 15    Comment: Performed at [facility], [address] [place], [place] [address]  CBC     Status: Abnormal   Collection Time: 06/18/19  3:22 PM  Result Value Ref Range   WBC 11.0 (H) 4.0 - 10.5 K/uL   RBC 4.85 3.87 - 5.11 MIL/uL   Hemoglobin 13.0 12.0 - 15.0 g/dL   HCT 41.342.0 24.436.0 - 01.046.0 %   MCV 86.6 80.0 - 100.0 fL   MCH 26.8 26.0 - 34.0 pg   MCHC 31.0 30.0 - 36.0 g/dL   RDW 27.213.8 53.611.5 - 64.415.5 %   Platelets 339 150 - 400 K/uL   nRBC 0.0 0.0 - 0.2 %    Comment: Performed at [facility], [address] [place], [place] [address]  Troponin I (High Sensitivity)     Status: Abnormal   Collection Time: 06/18/19  3:22 PM  Result Value Ref Range   Troponin I (High Sensitivity) 27 (H) <18 ng/L    Comment: (NOTE) Elevated high sensitivity troponin I (hsTnI) values and significant  changes across serial measurements may suggest ACS but many other  chronic and acute conditions are known to elevate hsTnI results.  Refer to the "Links" section for chest pain algorithms and additional  guidance. Performed at [facility], [address] [place] [address]   I-Stat beta hCG blood, ED     Status: None   Collection Time: 06/18/19  3:33 PM  Result Value Ref Range   I-stat hCG, quantitative <5.0 <5 mIU/mL   Comment 3            Comment:   GEST. AGE      CONC.  (mIU/mL)   <=1 WEEK        5 - 50     2 WEEKS       50 - 500     3 WEEKS       100 - 10,000     4 WEEKS     1,000 - 30,000        FEMALE AND NON-PREGNANT FEMALE:     LESS THAN 5 mIU/mL   Troponin I (High Sensitivity)     Status: Abnormal   Collection Time: 06/18/19  6:00 PM  Result Value Ref Range   Troponin I (High Sensitivity) 28 (H) <18 ng/L    Comment: (NOTE) Elevated high sensitivity troponin I (hsTnI) values and significant  changes across serial measurements may suggest ACS but many other  chronic and acute conditions are known to elevate hsTnI results.  Refer to the "Links" section for chest pain algorithms and additional  guidance. Performed at [facility], [address] [place], [place] [address]   SARS CORONAVIRUS 2 (TAT 6-24 HRS) Nasopharyngeal Nasopharyngeal Swab     Status: None   Collection Time: 06/18/19  8:15 PM   Specimen: Nasopharyngeal Swab  Result Value Ref Range   SARS Coronavirus 2 NEGATIVE NEGATIVE    Comment: (NOTE) SARS-CoV-2 target nucleic acids are NOT DETECTED. The SARS-CoV-2 RNA is generally detectable in upper and  lower respiratory specimens during the acute phase of infection. Negative results do not preclude SARS-CoV-2 infection, do not rule out co-infections with other pathogens, and should not be used as the sole basis for treatment or other patient management decisions. Negative results must be combined with clinical observations, patient history, and epidemiological information. The expected result is Negative. Fact Sheet for Patients: HairSlick.no Fact Sheet for Healthcare Providers: quierodirigir.com This test is not yet approved or cleared by the [place]  FDA and  has been authorized for detection and/or diagnosis of SARS-CoV-2 by FDA under an Emergency Use Authorization (EUA). This EUA will remain  in effect (meaning this test can be used) for the duration of the COVID-19 declaration under Section 56 4(b)(1) of the Act, 21 U.S.C. section 360bbb-3(b)(1), unless the authorization is terminated or revoked sooner. Performed at [facility], [address], [place], [place] [address]   HIV Antibody (routine testing w rflx)     Status: None   Collection Time: 06/18/19  9:36 PM  Result Value Ref Range   HIV Screen 4th Generation wRfx NON REACTIVE NON REACTIVE    Comment: Performed at [facility], [address] [place], [place] [address]  Heparin level (unfractionated)     Status: Abnormal   Collection Time: 06/19/19  2:27 AM  Result Value Ref Range   Heparin Unfractionated 1.02 (H) 0.30 - 0.70 IU/mL    Comment: (NOTE) If heparin results are below expected values, and patient dosage has  been confirmed, suggest follow up testing of antithrombin III levels. Performed at [facility], [address] [place], [place] [address]   CBC     Status: Abnormal   Collection Time: 06/19/19  2:27 AM  Result Value Ref Range   WBC 11.1 (H) 4.0 - 10.5 K/uL   RBC 4.74 3.87 - 5.11 MIL/uL   Hemoglobin 12.8 12.0 - 15.0 g/dL   HCT 91.4 78.2 - 95.6 %   MCV 87.3 80.0 - 100.0 fL   MCH 27.0 26.0 - 34.0 pg   MCHC 30.9 30.0 - 36.0 g/dL   RDW 21.3 08.6 - 57.8 %   Platelets 315 150 - 400 K/uL   nRBC 0.0 0.0 - 0.2 %    Comment: Performed at [facility], [address] [place], [place] [address]  Basic metabolic panel     Status: Abnormal   Collection Time: 06/19/19  2:27 AM  Result Value Ref Range   Sodium 138 135 - 145 mmol/L   Potassium 3.8 3.5 - 5.1 mmol/L   Chloride 107 98 - 111 mmol/L   CO2 23 22 - 32 mmol/L   Glucose, Bld 93 70 - 99 mg/dL    Comment: Glucose reference range applies only to samples taken after fasting for  at least 8 hours.   BUN 9 6 - 20 mg/dL   Creatinine, Ser 9.52 (H) 0.44 - 1.00 mg/dL   Calcium 8.9 8.9 - 84.1 mg/dL   GFR calc non Af Amer 54 (L) >60 mL/min   GFR calc Af Amer >60 >60 mL/min   Anion gap 8 5 - 15    Comment: Performed at [facility], [address] [place], [place] [address]  Heparin level (unfractionated)     Status: Abnormal   Collection Time: 06/19/19 10:33 AM  Result Value Ref Range   Heparin Unfractionated 0.89 (H) 0.30 - 0.70 IU/mL    Comment: (NOTE) If heparin results are below expected  values, and patient dosage has  been confirmed, suggest follow up testing of antithrombin III levels. Performed at [facility], [address], [place], [place] [address]   ECHOCARDIOGRAM COMPLETE     Status: None   Collection Time: 06/19/19  2:31 PM  Result Value Ref Range   Weight 3,552 oz   Height 65 in   BP 159/97 mmHg  Heparin level (unfractionated)     Status: None   Collection Time: 06/19/19  4:45 PM  Result Value Ref Range   Heparin Unfractionated 0.70 0.30 - 0.70 IU/mL    Comment: (NOTE) If heparin results are below expected values, and patient dosage has  been confirmed, suggest follow up testing of antithrombin III levels. Performed at [facility], [address], [place], [place] [address]   CBC     Status: Abnormal   Collection Time: 06/20/19 12:05 AM  Result Value Ref Range   WBC 11.9 (H) 4.0 - 10.5 K/uL   RBC 4.70 3.87 - 5.11 MIL/uL   Hemoglobin 12.9 12.0 - 15.0 g/dL   HCT 09.7 35.3 - 29.9 %   MCV 86.8 80.0 - 100.0 fL   MCH 27.4 26.0 - 34.0 pg   MCHC 31.6 30.0 - 36.0 g/dL   RDW 24.2 68.3 - 41.9 %   Platelets 337 150 - 400 K/uL   nRBC 0.0 0.0 - 0.2 %    Comment: Performed at [facility], [address] [place], [place] [address]  Basic metabolic panel     Status: Abnormal   Collection Time: 06/20/19 12:05 AM  Result Value Ref Range   Sodium 137 135 - 145 mmol/L   Potassium 4.2 3.5 - 5.1 mmol/L   Chloride 105 98 - 111  mmol/L   CO2 25 22 - 32 mmol/L   Glucose, Bld 107 (H) 70 - 99 mg/dL    Comment: Glucose reference range applies only to samples taken after fasting for at least 8 hours.   BUN 11 6 - 20 mg/dL   Creatinine, Ser 7.98 (H) 0.44 - 1.00 mg/dL   Calcium 9.1 8.9 - 92.1 mg/dL   GFR calc non Af Amer 35 (L) >60 mL/min   GFR calc Af Amer 41 (L) >60 mL/min   Anion gap 7 5 - 15    Comment: Performed at [facility], [address], [place], [place] [address]  Heparin level (unfractionated)     Status: None   Collection Time: 06/20/19 12:05 AM  Result Value Ref Range   Heparin Unfractionated 0.49 0.30 - 0.70 IU/mL    Comment: (NOTE) If heparin results are below expected values, and patient dosage has  been confirmed, suggest follow up testing of antithrombin III levels. Performed at [facility], [address], [place], [place] [address]   COMPLETE METABOLIC PANEL WITH GFR     Status: Abnormal   Collection Time: 07/04/19  1:55 PM  Result Value Ref Range   Glucose, Bld 88 65 - 139 mg/dL    Comment: .        Non-fasting reference interval .    BUN 18 7 - 25 mg/dL   Creat 4.81 (H) 8.56 - 1.10 mg/dL   GFR, Est Non African American 46 (L) > OR = 60 mL/min/1.39m2   GFR, Est African American 53 (L) > OR = 60 mL/min/1.17m2   BUN/Creatinine Ratio 13 6 - 22 (calc)   Sodium 137 135 - 146 mmol/L  Potassium 4.0 3.5 - 5.3 mmol/L   Chloride 105 98 - 110 mmol/L   CO2 24 20 - 32 mmol/L   Calcium 9.1 8.6 - 10.2 mg/dL   Total Protein 6.9 6.1 - 8.1 g/dL   Albumin 3.9 3.6 - 5.1 g/dL   Globulin 3.0 1.9 - 3.7 g/dL (calc)   AG Ratio 1.3 1.0 - 2.5 (calc)   Total Bilirubin 0.5 0.2 - 1.2 mg/dL   Alkaline phosphatase (APISO) 89 31 - 125 U/L   AST 17 10 - 35 U/L   ALT 18 6 - 29 U/L  CBC     Status: Abnormal   Collection Time: 07/04/19  1:55 PM  Result Value Ref Range   WBC 11.5 (H) 3.8 - 10.8 Thousand/uL   RBC 4.75 3.80 - 5.10 Million/uL   Hemoglobin 13.1 11.7 - 15.5 g/dL   HCT 38.7 56.4 -  33.2 %   MCV 84.4 80.0 - 100.0 fL   MCH 27.6 27.0 - 33.0 pg   MCHC 32.7 32.0 - 36.0 g/dL   RDW 95.1 88.4 - 16.6 %   Platelets 373 140 - 400 Thousand/uL   MPV 10.2 7.5 - 12.5 fL  Lactate dehydrogenase     Status: Abnormal   Collection Time: 07/10/19  2:11 PM  Result Value Ref Range   LDH 194 (H) 98 - 192 U/L    Comment: Performed at [facility], [address], [place], [place] [address]    RADIOGRAPHIC STUDIES: I have personally reviewed the radiological images as listed and agreed with the findings in the report. DG Chest 2 View  Result Date: 06/18/2019 CLINICAL DATA:  Chest pain and dizziness EXAM: CHEST - 2 VIEW COMPARISON:  04/22/2018 FINDINGS: Cardiac shadow is stable. Aortic calcifications are again seen. Interstitial prominence and scattered reticulonodular opacities are again seen and stable consistent with the given clinical history of sarcoidosis. No focal infiltrate or sizable effusion is seen. No bony abnormality is noted. IMPRESSION: Chronic changes consistent with the given clinical history of sarcoidosis stable from the prior exam. Electronically Signed   By: Alcide Clever M.D.   On: 06/18/2019 15:44   CT Angio Chest PE W and/or Wo Contrast  Result Date: 06/18/2019 CLINICAL DATA:  Dyspnea. Hemoptysis the past 3 days. Sarcoidosis. EXAM: CT ANGIOGRAPHY CHEST WITH CONTRAST TECHNIQUE: Multidetector CT imaging of the chest was performed using the standard protocol during bolus administration of intravenous contrast. Multiplanar CT image reconstructions and MIPs were obtained to evaluate the vascular anatomy. CONTRAST:  13mL OMNIPAQUE IOHEXOL 350 MG/ML SOLN COMPARISON:  None. Unenhanced CT chest high-resolution dated March 06, 2018. FINDINGS: Cardiovascular: Peripheral segmental and subsegmental bilateral pulmonary emboli, including in the right apex and lung bases. No central or lobar pulmonary embolism. No right heart strain. Borderline cardiomegaly. A 3.5 cm pulmonary trunk  diameter. Paucity of coronary calcification. Mediastinum/Nodes: Bulky mediastinal and hilar noncalcified lymph nodes consistent with the history of sarcoidosis. Lungs/Pleura: No pleural effusion or pneumothorax. Similar bilateral apical and lingular honeycombing and bilateral upper lobe mild traction bronchiectasis. Similar mild septal thickening. Nonspecific ground-glass opacities with wedge-shaped distributions in the left base and right middle lobe, not significantly changed. Upper Abdomen: Mild hepatomegaly. Cholecystectomy. Musculoskeletal: Mild skeletal degenerative change. Review of the MIP images confirms the above findings. I discussed critical Value/emergent results by telephone at the time of interpretation on 06/18/2019 at 7:52 pm with provider Terance Hart, who verbally acknowledged these results. IMPRESSION: 1. Peripheral segmental and subsegmental bilateral pulmonary emboli. No right heart strain. 2.  Bulky mediastinal and hilar lymph nodes and pulmonary findings consistent with the history of sarcoidosis, possibly with an acute inflammatory or infectious exacerbation. 3. Pulmonary artery hypertension and borderline cardiomegaly. 4. Mild hepatomegaly. Electronically Signed   By: Revonda Humphrey   On: 06/18/2019 19:55   ECHOCARDIOGRAM COMPLETE  Result Date: 06/19/2019    ECHOCARDIOGRAM REPORT   Patient Name:   Hailey Ross Date of Exam: 06/19/2019 Medical Rec #:  [id number]          Height:       65.0 in Accession #:    [id number]         Weight:       222.0 lb Date of Birth:  1970/12/07          BSA:          2.068 m Patient Age:    38 years           BP:           132/84 mmHg Patient Gender: F                  HR:           62 bpm. Exam Location:  Inpatient Procedure: 2D Echo, Cardiac Doppler and Color Doppler Indications:    I26.02 Pulmonary embolus  History:        Patient has no prior history of Echocardiogram examinations.                 Signs/Symptoms:Dyspnea. Sarcoidosis.  Sonographer:     Roseanna Rainbow RDCS Referring Phys: [phone] [facility] [address]  Sonographer Comments: Technically difficult study due to poor echo windows and patient is morbidly obese. Image acquisition challenging due to patient body habitus. IMPRESSIONS  1. Left ventricular ejection fraction, by estimation, is 60 to 65%. The left ventricle has normal function. The left ventricle has no regional wall motion abnormalities. There is mild left ventricular hypertrophy. Left ventricular diastolic parameters were normal.  2. Right ventricular systolic function is mildly reduced. The right ventricular size is normal. There is moderately elevated pulmonary artery systolic pressure.  3. The mitral valve is abnormal. Trivial mitral valve regurgitation.  4. The aortic valve is tricuspid. Aortic valve regurgitation is not visualized.  5. Aortic dilatation noted. There is mild dilatation of the ascending aorta measuring 39 mm. There is Moderate (Grade III) protruding plaque involving the distal aortc arch and proximal descending aorta.  6. The inferior vena cava is normal in size with <50% respiratory variability, suggesting right atrial pressure of 8 mmHg. FINDINGS  Left Ventricle: Left ventricular ejection fraction, by estimation, is 60 to 65%. The left ventricle has normal function. The left ventricle has no regional wall motion abnormalities. The left ventricular internal cavity size was normal in size. There is  mild left ventricular hypertrophy. Left ventricular diastolic parameters were normal. Right Ventricle: The right ventricular size is normal. No increase in right ventricular wall thickness. Right ventricular systolic function is mildly reduced. There is moderately elevated pulmonary artery systolic pressure. The tricuspid regurgitant velocity is 3.50 m/s, and with an assumed right atrial pressure of 8 mmHg, the estimated right ventricular systolic pressure is 34.1 mmHg. Left Atrium: Left atrial size was normal in size. Right Atrium: Right  atrial size was normal in size. Pericardium: There is no evidence of pericardial effusion. Mitral Valve: The mitral valve is abnormal. There is mild thickening of the mitral valve leaflet(s). Trivial mitral valve regurgitation. Tricuspid Valve: The tricuspid valve  is grossly normal. Tricuspid valve regurgitation is mild. Aortic Valve: The aortic valve is tricuspid. Aortic valve regurgitation is not visualized. Pulmonic Valve: The pulmonic valve was normal in structure. Pulmonic valve regurgitation is not visualized. Aorta: Aortic dilatation noted. There is mild dilatation of the ascending aorta measuring 39 mm. There is moderate (Grade III) protruding plaque involving the descending aorta. Venous: The inferior vena cava is normal in size with less than 50% respiratory variability, suggesting right atrial pressure of 8 mmHg. IAS/Shunts: No atrial level shunt detected by color flow Doppler.  LEFT VENTRICLE PLAX 2D LVIDd:         4.71 cm      Diastology LVIDs:         2.74 cm      LV e' lateral:   13.40 cm/s LV PW:         1.11 cm      LV E/e' lateral: 6.8 LV IVS:        1.15 cm      LV e' medial:    9.14 cm/s LVOT diam:     2.10 cm      LV E/e' medial:  9.9 LV SV:         83 LV SV Index:   40 LVOT Area:     3.46 cm  LV Volumes (MOD) LV vol d, MOD A2C: 97.9 ml LV vol d, MOD A4C: 117.0 ml LV vol s, MOD A2C: 40.2 ml LV vol s, MOD A4C: 50.9 ml LV SV MOD A2C:     57.7 ml LV SV MOD A4C:     117.0 ml LV SV MOD BP:      62.5 ml RIGHT VENTRICLE            IVC RV S prime:     9.68 cm/s  IVC diam: 1.92 cm TAPSE (M-mode): 2.4 cm LEFT ATRIUM             Index       RIGHT ATRIUM           Index LA diam:        3.30 cm 1.60 cm/m  RA Area:     18.30 cm LA Vol (A2C):   34.0 ml 16.44 ml/m RA Volume:   56.80 ml  27.47 ml/m LA Vol (A4C):   39.5 ml 19.10 ml/m LA Biplane Vol: 37.4 ml 18.09 ml/m  AORTIC VALVE LVOT Vmax:   129.00 cm/s LVOT Vmean:  81.300 cm/s LVOT VTI:    0.240 m  AORTA Ao Root diam: 3.00 cm Ao Asc diam:  3.90 cm  MITRAL VALVE               TRICUSPID VALVE MV Area (PHT): 4.21 cm    TR Peak grad:   49.0 mmHg MV Decel Time: 180 msec    TR Vmax:        350.00 cm/s MV E velocity: 90.80 cm/s MV A velocity: 81.80 cm/s  SHUNTS MV E/A ratio:  1.11        Systemic VTI:  0.24 m                            Systemic Diam: 2.10 cm Zoila Shutter MD Electronically signed by Zoila Shutter MD Signature Date/Time: 06/19/2019/3:36:12 PM    Final    VAS [place] LOWER EXTREMITY VENOUS (DVT) (ONLY [facility] & [facility])  Result Date: 06/20/2019  Lower Venous DVTStudy Indications: Edema.  Risk Factors: None identified. Comparison Study: No prior studies. Performing Technologist: Chanda Busing RVT  Examination Guidelines: A complete evaluation includes B-mode imaging, spectral Doppler, color Doppler, and power Doppler as needed of all accessible portions of each vessel. Bilateral testing is considered an integral part of a complete examination. Limited examinations for reoccurring indications may be performed as noted. The reflux portion of the exam is performed with the patient in reverse Trendelenburg.  +---------+---------------+---------+-----------+----------+--------------+ RIGHT    CompressibilityPhasicitySpontaneityPropertiesThrombus Aging +---------+---------------+---------+-----------+----------+--------------+ CFV      Full           Yes      Yes                                 +---------+---------------+---------+-----------+----------+--------------+ SFJ      Full                                                        +---------+---------------+---------+-----------+----------+--------------+ FV Prox  Full                                                        +---------+---------------+---------+-----------+----------+--------------+ FV Mid   Full                                                        +---------+---------------+---------+-----------+----------+--------------+ FV DistalFull                                                         +---------+---------------+---------+-----------+----------+--------------+ PFV      Full                                                        +---------+---------------+---------+-----------+----------+--------------+ POP      Full           Yes      Yes                                 +---------+---------------+---------+-----------+----------+--------------+ PTV      Full                                                        +---------+---------------+---------+-----------+----------+--------------+ PERO     Full                                                        +---------+---------------+---------+-----------+----------+--------------+   +----+---------------+---------+-----------+----------+--------------+  LEFTCompressibilityPhasicitySpontaneityPropertiesThrombus Aging +----+---------------+---------+-----------+----------+--------------+ CFV Full           Yes      Yes                                 +----+---------------+---------+-----------+----------+--------------+     Summary: RIGHT: - There is no evidence of deep vein thrombosis in the lower extremity.  - No cystic structure found in the popliteal fossa.  LEFT: - No evidence of common femoral vein obstruction.  *See table(s) above for measurements and observations. Electronically signed by Sherald Hess MD on 06/20/2019 at 3:35:22 PM.    Final    DG Knee 3 Views Right  Result Date: 07/09/2019 Clinical:  Right knee pain, swelling X-rays were done of the right knee, three views. There is slight effusion of the right knee. Alignment is good.  No fracture or loose body noted. Bone quality is good. Impression:  Slight effusion of the right knee, no fracture noted. Electronically Signed Darreld Mclean, MD 3/30/20219:54 AM    ASSESSMENT & PLAN:  Bilateral pulmonary embolism (HCC) 1.  Unprovoked bilateral pulmonary embolism: -Patient reported having right knee  pain and swelling and on and off lightheadedness since January of this year. -As the lightheadedness got worse and she coughed up blood-streaked sputum, she went to the ER on 06/18/2019. -CT chest angiogram on 06/18/2019 showed peripheral segmental and subsegmental bilateral pulmonary emboli with no right heart strain.  Bulky mediastinal and hilar lymph nodes and pulmonary findings consistent with history of sarcoidosis. -Bilateral lower extremity Doppler was negative on the same day for DVT. -She has started Xarelto and has been doing well. -She denies any prior history of DVT.  No history of oral contraceptive use. -She quit smoking about 5 years ago and smoked 1 pack/day for 22 years. -She works full-time job at [employer].  No family history of deep vein thrombosis. -Given the unprovoked nature of the pulmonary embolism, she will likely be a candidate for long-term anticoagulation. -We will check anticardiolipin antibody, antibeta-2 glycoprotein antibody, factor V Leiden and prothrombin gene mutation.  We will see her back in 2 to 3 weeks for follow-up.  2.  Sarcoidosis: -Patient was diagnosed with sarcoidosis in January 2018. -She was treated with prednisone for a year and is now on inhaler.     All questions were answered. The patient knows to call the clinic with any problems, questions or concerns.      Doreatha Massed, MD 07/10/19 6:25 PM

## 2019-07-10 NOTE — Patient Instructions (Signed)
[facility] at [facility] Discharge Instructions  You were seen today by Dr. Ellin Saba. He went over your history, family history and how you've been feeling since your recent ER visit. You will have blood drawn today before you leave the hospital. He will see you back in 3 weeks for follow up.   Thank you for choosing [facility] at [facility] to provide your oncology and hematology care.  To afford each patient quality time with our provider, please arrive at least 15 minutes before your scheduled appointment time.   If you have a lab appointment with the Cancer Center please come in thru the  Main Entrance and check in at the main information desk  You need to re-schedule your appointment should you arrive 10 or more minutes late.  We strive to give you quality time with our providers, and arriving late affects you and other patients whose appointments are after yours.  Also, if you no show three or more times for appointments you may be dismissed from the clinic at the providers discretion.     Again, thank you for choosing [facility].  Our hope is that these requests will decrease the amount of time that you wait before being seen by our physicians.       _____________________________________________________________  Should you have questions after your visit to [facility], please contact our office at [phone] between the hours of 8:00 a.m. and 4:30 p.m.  Voicemails left after 4:00 p.m. will not be returned until the following business day.  For prescription refill requests, have your pharmacy contact our office and allow 72 hours.    Cancer Center Support Programs:   > Cancer Support Group  2nd Tuesday of the month 1pm-2pm, Journey Room

## 2019-07-10 NOTE — Assessment & Plan Note (Signed)
1.  Unprovoked bilateral pulmonary embolism: -Patient reported having right knee pain and swelling and on and off lightheadedness since January of this year. -As the lightheadedness got worse and she coughed up blood-streaked sputum, she went to the ER on 06/18/2019. -CT chest angiogram on 06/18/2019 showed peripheral segmental and subsegmental bilateral pulmonary emboli with no right heart strain.  Bulky mediastinal and hilar lymph nodes and pulmonary findings consistent with history of sarcoidosis. -Bilateral lower extremity Doppler was negative on the same day for DVT. -She has started Xarelto and has been doing well. -She denies any prior history of DVT.  No history of oral contraceptive use. -She quit smoking about 5 years ago and smoked 1 pack/day for 22 years. -She works full-time job at [employer].  No family history of deep vein thrombosis. -Given the unprovoked nature of the pulmonary embolism, she will likely be a candidate for long-term anticoagulation. -We will check anticardiolipin antibody, antibeta-2 glycoprotein antibody, factor V Leiden and prothrombin gene mutation.  We will see her back in 2 to 3 weeks for follow-up.  2.  Sarcoidosis: -Patient was diagnosed with sarcoidosis in January 2018. -She was treated with prednisone for a year and is now on inhaler.

## 2019-07-12 ENCOUNTER — Other Ambulatory Visit: Payer: Self-pay

## 2019-07-12 ENCOUNTER — Other Ambulatory Visit: Payer: Self-pay | Admitting: Nephrology

## 2019-07-12 ENCOUNTER — Telehealth: Payer: Self-pay

## 2019-07-12 ENCOUNTER — Ambulatory Visit
Admission: EM | Admit: 2019-07-12 | Discharge: 2019-07-12 | Disposition: A | Discharge status: Home / Self Care | Payer: 59 | Source: Home / Self Care

## 2019-07-12 ENCOUNTER — Emergency Department (HOSPITAL_COMMUNITY)
Admission: EM | Admit: 2019-07-12 | Discharge: 2019-07-12 | Disposition: A | Discharge status: Home / Self Care | Payer: 59 | Attending: Emergency Medicine | Admitting: Emergency Medicine

## 2019-07-12 DIAGNOSIS — I129 Hypertensive chronic kidney disease with stage 1 through stage 4 chronic kidney disease, or unspecified chronic kidney disease: Secondary | ICD-10-CM | POA: Insufficient documentation

## 2019-07-12 DIAGNOSIS — Z7901 Long term (current) use of anticoagulants: Secondary | ICD-10-CM | POA: Insufficient documentation

## 2019-07-12 DIAGNOSIS — Z87891 Personal history of nicotine dependence: Secondary | ICD-10-CM | POA: Diagnosis not present

## 2019-07-12 DIAGNOSIS — N170 Acute kidney failure with tubular necrosis: Secondary | ICD-10-CM

## 2019-07-12 DIAGNOSIS — N182 Chronic kidney disease, stage 2 (mild): Secondary | ICD-10-CM | POA: Insufficient documentation

## 2019-07-12 DIAGNOSIS — Z79899 Other long term (current) drug therapy: Secondary | ICD-10-CM | POA: Diagnosis not present

## 2019-07-12 DIAGNOSIS — N939 Abnormal uterine and vaginal bleeding, unspecified: Secondary | ICD-10-CM | POA: Insufficient documentation

## 2019-07-12 LAB — LUPUS ANTICOAGULANT PANEL
DRVVT: 137.4 s — ABNORMAL HIGH (ref 0.0–47.0)
PTT Lupus Anticoagulant: 52.6 s — ABNORMAL HIGH (ref 0.0–51.9)

## 2019-07-12 LAB — POC URINE PREG, ED: Preg Test, Ur: NEGATIVE

## 2019-07-12 LAB — BASIC METABOLIC PANEL
Anion gap: 6 (ref 5–15)
BUN: 20 mg/dL (ref 6–20)
CO2: 25 mmol/L (ref 22–32)
Calcium: 9 mg/dL (ref 8.9–10.3)
Chloride: 106 mmol/L (ref 98–111)
Creatinine, Ser: 1.22 mg/dL — ABNORMAL HIGH (ref 0.44–1.00)
GFR calc Af Amer: >60 mL/min (ref >60)
GFR calc non Af Amer: 52 mL/min — ABNORMAL LOW (ref >60)
Glucose, Bld: 88 mg/dL (ref 70–99)
Potassium: 3.9 mmol/L (ref 3.5–5.1)
Sodium: 137 mmol/L (ref 135–145)

## 2019-07-12 LAB — CBC WITH DIFFERENTIAL/PLATELET
Abs Immature Granulocytes: 0.06 10*3/uL (ref 0.00–0.07)
Basophils Absolute: 0.1 10*3/uL (ref 0.0–0.1)
Basophils Relative: 0 %
Eosinophils Absolute: 0.1 10*3/uL (ref 0.0–0.5)
Eosinophils Relative: 0 %
HCT: 41.9 % (ref 36.0–46.0)
Hemoglobin: 13 g/dL (ref 12.0–15.0)
Immature Granulocytes: 0 %
Lymphocytes Relative: 26 %
Lymphs Abs: 3.7 10*3/uL (ref 0.7–4.0)
MCH: 27.2 pg (ref 26.0–34.0)
MCHC: 31 g/dL (ref 30.0–36.0)
MCV: 87.7 fL (ref 80.0–100.0)
Monocytes Absolute: 0.7 10*3/uL (ref 0.1–1.0)
Monocytes Relative: 5 %
Neutro Abs: 9.7 10*3/uL — ABNORMAL HIGH (ref 1.7–7.7)
Neutrophils Relative %: 69 %
Platelets: 407 10*3/uL — ABNORMAL HIGH (ref 150–400)
RBC: 4.78 MIL/uL (ref 3.87–5.11)
RDW: 13.1 % (ref 11.5–15.5)
WBC: 14.3 10*3/uL — ABNORMAL HIGH (ref 4.0–10.5)
nRBC: 0 % (ref 0.0–0.2)

## 2019-07-12 LAB — PTT-LA MIX: PTT-LA Mix: 48.1 s (ref 0.0–48.9)

## 2019-07-12 LAB — CARDIOLIPIN ANTIBODIES, IGG, IGM, IGA
Anticardiolipin IgA: <9 APL U/mL (ref 0–11)
Anticardiolipin IgG: <9 GPL U/mL (ref 0–14)
Anticardiolipin IgM: <9 MPL U/mL (ref 0–12)

## 2019-07-12 LAB — DRVVT CONFIRM: dRVVT Confirm: 1.7 ratio — ABNORMAL HIGH (ref 0.8–1.2)

## 2019-07-12 LAB — DRVVT MIX: dRVVT Mix: 92 s — ABNORMAL HIGH (ref 0.0–40.4)

## 2019-07-12 LAB — PTT-LA INCUB MIX: PTT-LA Incub Mix: 42.2 s (ref 0.0–48.9)

## 2019-07-12 NOTE — Discharge Instructions (Addendum)
As discussed, all of your labs were reassuring.  Your hemoglobin was normal and your blood pressure is mildly elevated and not decreased.  No concern for severe blood loss at this time.  Please call your OB/GYN to schedule an appointment for further evaluation.  Return to the ER for new or worsening symptoms.

## 2019-07-12 NOTE — Telephone Encounter (Signed)
Pt has used two pads an hour and they were saturated started late last night. Periods are normally very light and when she stands up she can feel this it was worrying her as this has never happened to her.

## 2019-07-12 NOTE — Telephone Encounter (Signed)
Pt called to say that her period is Extremely Heavy, and wants to make sure that she would be ok, taking the blood thinners, and if there was anything she need to watch for

## 2019-07-12 NOTE — ED Triage Notes (Signed)
Patient comes to the ED after Urgent care evaluation.  Patient has a history of PE from march the 9th and had been put on xarelto.  Patient states she is on her menstrual cycle now and is bleeding more than usual, saturating 2 pads an hour.  Patient feels slightly lightheaded.

## 2019-07-12 NOTE — ED Triage Notes (Signed)
Pt presents to have hemoglobin checked. Reports she has had a large amount of vaginal bleeding and is on a blood thinner. Patient feels minimally light headed. Pt educated that blood work from the Urgent Care setting would not result until tomorrow. Advised patient to go to the ED. Refuses EMS transport.

## 2019-07-12 NOTE — Telephone Encounter (Signed)
Most likely related to blood thinner, however, she might need to be assessed in person to make sure BP and Hbg are not dropping, since she is normal a light cycle. I would recommend UC or ED for follow up. Two pads an hour is a lot-but she needs to be assessed for the BP and blood levels.

## 2019-07-12 NOTE — Telephone Encounter (Signed)
Pt advised or instructions to go to urgent care or ER to check her hemoglobin as she had her bp checked and it was normal with verbal understanding

## 2019-07-12 NOTE — ED Provider Notes (Signed)
[facility] EMERGENCY DEPARTMENT Provider Note   CSN: [id number] Arrival date & time: 07/12/19  1322     History Chief Complaint  Patient presents with  . Vaginal Bleeding    Hailey Ross is a 49 y.o. female with a past medical history significant for sarcoidosis and history of PE on chronic Xarelto who presents to the ED due to increased vaginal bleeding that started yesterday.  Patient states she recently had a PE on March 9th and was placed on Xarelto.  She notes this is her first menstrual cycle while on Xarelto and notes it is heavier than her normal cycle.  She notes typically her cycle is extremely light and now she is saturating 2 pads every hour.  She states her menstrual cycle has slightly lightened up over the past few hours.  She had menstrual cramps earlier this morning which has completely resolved.  She has not been sexually active since her blood clot back in March.  No concerns for STDs or pregnancy at this time.  She endorses intermittent episodes of lightheadedness.  She was evaluated at urgent care prior to arrival and was told to go to the ED for further evaluation. Denies headache, chest pain, shortness of breath, urinary symptoms, and increased vaginal discharge.   History obtained from patient and past medical records. No interpreter used during encounter.      Past Medical History:  Diagnosis Date  . Acute cholecystitis 08/24/2016  . Acute kidney injury superimposed on chronic kidney disease (HCC) 06/20/2019  . Acute respiratory failure with hypoxia (HCC) 06/19/2019  . Annual visit for general adult medical examination with abnormal findings 04/16/2019  . Bronchiectasis (HCC)   . CKD (chronic kidney disease)   . Lightheadedness 05/10/2019  . PE (pulmonary thromboembolism) (HCC) 06/2019  . Pre-syncope 04/16/2019  . Sarcoidosis   . Sarcoidosis   . Smoker 02/12/2014    Patient Active Problem List   Diagnosis Date Noted  . Essential hypertension 07/04/2019  .  Pain and swelling of right knee 07/04/2019  . Hyperlipidemia 07/04/2019  . Stage 2 chronic kidney disease 07/04/2019  . 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 Encounter for support and coordination of transition of care 07/04/2019  . Daytime hypersomnolence 07/02/2019  . Pulmonary hypertension (HCC) 07/02/2019  . CKD (chronic kidney disease)   . Bilateral pulmonary embolism (HCC) 06/18/2019  . Vitamin D deficiency 05/10/2019  . Class 2 obesity due to excess calories with body mass index (BMI) of 37.0 to 37.9 in adult 04/16/2019  . Sarcoidosis 05/04/2015  . Bronchiectasis (HCC) 05/04/2015    Past Surgical History:  Procedure Laterality Date  . CHOLECYSTECTOMY N/A 08/25/2016   Procedure: LAPAROSCOPIC CHOLECYSTECTOMY;  Surgeon: Abigail Miyamoto, MD;  Location: [facility] OR;  Service: General;  Laterality: N/A;  . TUBAL LIGATION  02/1993     OB History   No obstetric history on file.     Family History  Problem Relation Age of Onset  . Hypertension Mother   . Cancer Mother     Social History   Tobacco Use  . Smoking status: Former Smoker    Packs/day: 1.00    Years: 20.00    Pack years: 20.00    Types: Cigarettes    Quit date: 03/21/2015    Years since quitting: 4.3  . Smokeless tobacco: Never Used  Substance Use Topics  . Alcohol use: Not Currently    Alcohol/week: 0.0 standard drinks    Comment: Rare  . Drug use: No  Home Medications Prior to Admission medications   Medication Sig Start Date End Date Taking? Authorizing Provider  acetaminophen (TYLENOL) 500 MG tablet Take 500 mg by mouth every 4 (four) hours as needed for mild pain, moderate pain or headache.    Yes [provider]  albuterol (PROAIR HFA) 108 (90 Base) MCG/ACT inhaler Inhale 2 puffs into the lungs every 6 (six) hours as needed for wheezing or shortness of breath. 04/23/19  Yes Coralyn Helling, MD  rivaroxaban (XARELTO) 20 MG TABS tablet Take 1 tablet (20 mg total) by mouth daily with supper.  07/08/19  Yes Parrett, Tammy S, NP  rosuvastatin (CRESTOR) 5 MG tablet Take 1 tablet (5 mg total) by mouth daily. 07/08/19  Yes Freddy Finner, NP  Vitamin D, Ergocalciferol, (DRISDOL) 1.25 MG (50000 UNIT) CAPS capsule Take 1 capsule (50,000 Units total) by mouth every 7 (seven) days. 07/08/19  Yes Freddy Finner, NP    Allergies    Patient has no known allergies.  Review of Systems   Review of Systems  Constitutional: Negative for chills and fever.  Respiratory: Negative for shortness of breath.   Cardiovascular: Negative for chest pain.  Gastrointestinal: Positive for abdominal pain (resolved). Negative for diarrhea, nausea and vomiting.  Genitourinary: Positive for vaginal bleeding. Negative for dysuria and vaginal discharge.  All other systems reviewed and are negative.   Physical Exam Updated Vital Signs BP (!) 141/94 (BP Location: Right Arm)   Pulse 65   Temp 97.6 F (36.4 C) (Oral)   Resp 15   Ht 5\' 5"  (1.651 m)   Wt 99 kg   LMP 06/20/2019   SpO2 97%   BMI 36.32 kg/m   Physical Exam Vitals and nursing note reviewed. Exam conducted with a chaperone present.  Constitutional:      General: She is not in acute distress.    Appearance: She is not ill-appearing.  HENT:     Head: Normocephalic.  Eyes:     Pupils: Pupils are equal, round, and reactive to light.  Cardiovascular:     Rate and Rhythm: Normal rate and regular rhythm.     Pulses: Normal pulses.     Heart sounds: Normal heart sounds. No murmur. No friction rub. No gallop.   Pulmonary:     Effort: Pulmonary effort is normal.     Breath sounds: Normal breath sounds.  Abdominal:     General: Abdomen is flat. There is no distension.     Palpations: Abdomen is soft.     Tenderness: There is no abdominal tenderness. There is no guarding or rebound.  Genitourinary:    Exam position: Supine.     Vagina: Bleeding present. No vaginal discharge or tenderness.     Cervix: Cervical bleeding present. No cervical  motion tenderness or discharge.     Uterus: Normal.      Adnexa: Right adnexa normal and left adnexa normal.       Right: No tenderness.         Left: No tenderness.    Musculoskeletal:     Cervical back: Neck supple.     Comments: Able to move all 4 extremities without difficulty.   Skin:    General: Skin is warm and dry.  Neurological:     General: No focal deficit present.     Mental Status: She is alert.  Psychiatric:        Mood and Affect: Mood normal.        Behavior: Behavior normal.  ED Results / Procedures / Treatments   Labs (all labs ordered are listed, but only abnormal results are displayed) Labs Reviewed  CBC WITH DIFFERENTIAL/PLATELET - Abnormal; Notable for the following components:      Result Value   WBC 14.3 (*)    Platelets 407 (*)    Neutro Abs 9.7 (*)    All other components within normal limits  BASIC METABOLIC PANEL - Abnormal; Notable for the following components:   Creatinine, Ser 1.22 (*)    GFR calc non Af Amer 52 (*)    All other components within normal limits  PREGNANCY, URINE  POC URINE PREG, ED    EKG None  Radiology No results found.  Procedures Procedures (including critical care time)  Medications Ordered in ED Medications - No data to display  ED Course  I have reviewed the triage vital signs and the nursing notes.  Pertinent labs & imaging results that were available during my care of the patient were reviewed by me and considered in my medical decision making (see chart for details).  Clinical Course as of Jul 11 1437  Fri Jul 12, 2019  1417 WBC(!): 14.3 [CA]  1430 Hemoglobin: 13.0 [CA]  1433 Preg Test, Ur: NEGATIVE [CA]  1433 Creatinine(!): 1.22 [CA]    Clinical Course User Index [CA] Suzy Bouchard, PA-C   MDM Rules/Calculators/A&P                     49 year old female presents to the ED due to increased vaginal bleeding since being on Xarelto for a previous PE on 06/18/2019.  Patient notes her  menstrual cycle is typically light however since being on Xarelto for the past day has been extremely heavy in which she saturates 2 pads every hour.  Admits to intermittent episodes of lightheadedness.  Stable vitals.  BP 141/94.  No concern for hypertension at this time.  Patient in no acute distress and non-ill-appearing.  Abdomen soft, nondistended, nontender.  Pelvic exam reassuring with closed eyes and mild amount of blood in the vaginal vault.  No CMT.  Doubt PID.  No vaginal discharge.  CBC reassuring with normal hemoglobin at 13 with mild leukocytosis at 14.3.  BMP reassuring with mild AKI with creatinine at 1.22, but otherwise unremarkable.  Pregnancy test negative.  Suspect heavy menstruation due to Xarelto. Patient appears stable here in the ED with no signs of heavy blood loss. Will discharge patient with OBGYN follow-up. Strict ED precautions discussed with patient. Patient states understanding and agrees to plan. Patient discharged home in no acute distress and stable vitals.  Discussed case with Dr. Lacinda Axon who agrees with assessment and plan.  Final Clinical Impression(s) / ED Diagnoses Final diagnoses:  Vaginal bleeding    Rx / DC Orders ED Discharge Orders    None       Karie Kirks 07/12/19 1439    Nat Christen, MD 07/15/19 [phone]

## 2019-07-15 ENCOUNTER — Encounter (HOSPITAL_COMMUNITY): Payer: Self-pay | Admitting: *Deleted

## 2019-07-15 ENCOUNTER — Other Ambulatory Visit: Payer: Self-pay

## 2019-07-15 ENCOUNTER — Emergency Department (HOSPITAL_COMMUNITY): Payer: 59

## 2019-07-15 ENCOUNTER — Emergency Department (HOSPITAL_COMMUNITY)
Admission: EM | Admit: 2019-07-15 | Discharge: 2019-07-15 | Disposition: A | Discharge status: Home / Self Care | Payer: 59 | Attending: Emergency Medicine | Admitting: Emergency Medicine

## 2019-07-15 ENCOUNTER — Emergency Department (HOSPITAL_BASED_OUTPATIENT_CLINIC_OR_DEPARTMENT_OTHER): Payer: 59

## 2019-07-15 DIAGNOSIS — I129 Hypertensive chronic kidney disease with stage 1 through stage 4 chronic kidney disease, or unspecified chronic kidney disease: Secondary | ICD-10-CM | POA: Insufficient documentation

## 2019-07-15 DIAGNOSIS — R002 Palpitations: Secondary | ICD-10-CM | POA: Diagnosis not present

## 2019-07-15 DIAGNOSIS — R0602 Shortness of breath: Secondary | ICD-10-CM | POA: Diagnosis present

## 2019-07-15 DIAGNOSIS — N189 Chronic kidney disease, unspecified: Secondary | ICD-10-CM | POA: Diagnosis not present

## 2019-07-15 DIAGNOSIS — Z79899 Other long term (current) drug therapy: Secondary | ICD-10-CM | POA: Insufficient documentation

## 2019-07-15 DIAGNOSIS — M7121 Synovial cyst of popliteal space [Baker], right knee: Secondary | ICD-10-CM | POA: Diagnosis not present

## 2019-07-15 DIAGNOSIS — M7989 Other specified soft tissue disorders: Secondary | ICD-10-CM

## 2019-07-15 DIAGNOSIS — M79604 Pain in right leg: Secondary | ICD-10-CM

## 2019-07-15 DIAGNOSIS — Z87891 Personal history of nicotine dependence: Secondary | ICD-10-CM | POA: Diagnosis not present

## 2019-07-15 DIAGNOSIS — M79661 Pain in right lower leg: Secondary | ICD-10-CM | POA: Insufficient documentation

## 2019-07-15 DIAGNOSIS — Z7901 Long term (current) use of anticoagulants: Secondary | ICD-10-CM | POA: Diagnosis not present

## 2019-07-15 DIAGNOSIS — R079 Chest pain, unspecified: Secondary | ICD-10-CM | POA: Diagnosis not present

## 2019-07-15 DIAGNOSIS — R0600 Dyspnea, unspecified: Secondary | ICD-10-CM | POA: Diagnosis not present

## 2019-07-15 LAB — PROTHROMBIN GENE MUTATION

## 2019-07-15 LAB — CBC
HCT: 38.4 % (ref 36.0–46.0)
Hemoglobin: 12.1 g/dL (ref 12.0–15.0)
MCH: 27.9 pg (ref 26.0–34.0)
MCHC: 31.5 g/dL (ref 30.0–36.0)
MCV: 88.5 fL (ref 80.0–100.0)
Platelets: 368 10*3/uL (ref 150–400)
RBC: 4.34 MIL/uL (ref 3.87–5.11)
RDW: 13.1 % (ref 11.5–15.5)
WBC: 13.3 10*3/uL — ABNORMAL HIGH (ref 4.0–10.5)
nRBC: 0 % (ref 0.0–0.2)

## 2019-07-15 LAB — BASIC METABOLIC PANEL
Anion gap: 9 (ref 5–15)
BUN: 18 mg/dL (ref 6–20)
CO2: 25 mmol/L (ref 22–32)
Calcium: 9.1 mg/dL (ref 8.9–10.3)
Chloride: 106 mmol/L (ref 98–111)
Creatinine, Ser: 1.31 mg/dL — ABNORMAL HIGH (ref 0.44–1.00)
GFR calc Af Amer: 55 mL/min — ABNORMAL LOW (ref >60)
GFR calc non Af Amer: 48 mL/min — ABNORMAL LOW (ref >60)
Glucose, Bld: 113 mg/dL — ABNORMAL HIGH (ref 70–99)
Potassium: 4 mmol/L (ref 3.5–5.1)
Sodium: 140 mmol/L (ref 135–145)

## 2019-07-15 LAB — I-STAT BETA HCG BLOOD, ED (MC, WL, AP ONLY): I-stat hCG, quantitative: <5 m[IU]/mL (ref <5)

## 2019-07-15 LAB — TROPONIN I (HIGH SENSITIVITY)
Troponin I (High Sensitivity): 11 ng/L (ref <18)
Troponin I (High Sensitivity): 13 ng/L (ref <18)

## 2019-07-15 LAB — FACTOR 5 LEIDEN

## 2019-07-15 MED ORDER — SODIUM CHLORIDE 0.9% FLUSH: 3.0000 mL | Freq: Once | INTRAVENOUS | Status: DC

## 2019-07-15 MED ORDER — IOHEXOL 350 MG/ML SOLN
75.0000 mL | Freq: Once | INTRAVENOUS | Status: AC | PRN
  Administered 2019-07-15: 75 mL via INTRAVENOUS

## 2019-07-15 NOTE — Discharge Instructions (Addendum)
Please schedule follow-up appoint with primary doctor regarding symptoms you are experiencing today.  Return to ER if you develop worsening chest pain, difficulty breathing, or other new concerning symptom.

## 2019-07-15 NOTE — ED Notes (Signed)
Pt transported to CT ?

## 2019-07-15 NOTE — ED Provider Notes (Signed)
[facility] EMERGENCY DEPARTMENT Provider Note   CSN: [id number] Arrival date & time: 07/15/19  0434     History Chief Complaint  Patient presents with  . Shortness of Breath    Hailey Ross is a 49 y.o. female.  Presented to ER with complaint of episode of chest pain or difficulty breathing.  Reports this morning she woke up from sleep feeling very short of breath, experiencing palpitations in her chest.  Felt like her heart was racing.  Not have much frank chest pain.  States the symptoms have since resolved.  Denies any ongoing chest pain or difficulty breathing.  Also feels like she is having worsening right calf, middle leg pain.  Has been having this for the past month or so, but feels to be somewhat worsened lately.  No falls, bearing weight without issues.  Recent diagnosis acute PE, reports compliance with Xarelto, no recent missed doses.  HPI     Past Medical History:  Diagnosis Date  . Acute cholecystitis 08/24/2016  . Acute kidney injury superimposed on chronic kidney disease (HCC) 06/20/2019  . Acute respiratory failure with hypoxia (HCC) 06/19/2019  . Annual visit for general adult medical examination with abnormal findings 04/16/2019  . Bronchiectasis (HCC)   . CKD (chronic kidney disease)   . Lightheadedness 05/10/2019  . PE (pulmonary thromboembolism) (HCC) 06/2019  . Pre-syncope 04/16/2019  . Sarcoidosis   . Sarcoidosis   . Smoker 02/12/2014    Patient Active Problem List   Diagnosis Date Noted  . Essential hypertension 07/04/2019  . Pain and swelling of right knee 07/04/2019  . Hyperlipidemia 07/04/2019  . Stage 2 chronic kidney disease 07/04/2019  . 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 Encounter for support and coordination of transition of care 07/04/2019  . Daytime hypersomnolence 07/02/2019  . Pulmonary hypertension (HCC) 07/02/2019  . CKD (chronic kidney disease)   . Bilateral pulmonary embolism (HCC) 06/18/2019  . Vitamin D  deficiency 05/10/2019  . Class 2 obesity due to excess calories with body mass index (BMI) of 37.0 to 37.9 in adult 04/16/2019  . Sarcoidosis 05/04/2015  . Bronchiectasis (HCC) 05/04/2015    Past Surgical History:  Procedure Laterality Date  . CHOLECYSTECTOMY N/A 08/25/2016   Procedure: LAPAROSCOPIC CHOLECYSTECTOMY;  Surgeon: Abigail Miyamoto, MD;  Location: [facility] OR;  Service: General;  Laterality: N/A;  . TUBAL LIGATION  02/1993     OB History   No obstetric history on file.     Family History  Problem Relation Age of Onset  . Hypertension Mother   . Cancer Mother     Social History   Tobacco Use  . Smoking status: Former Smoker    Packs/day: 1.00    Years: 20.00    Pack years: 20.00    Types: Cigarettes    Quit date: 03/21/2015    Years since quitting: 4.3  . Smokeless tobacco: Never Used  Substance Use Topics  . Alcohol use: Not Currently    Alcohol/week: 0.0 standard drinks    Comment: Rare  . Drug use: No    Home Medications Prior to Admission medications   Medication Sig Start Date End Date Taking? Authorizing Provider  acetaminophen (TYLENOL) 500 MG tablet Take 500 mg by mouth every 4 (four) hours as needed for mild pain, moderate pain or headache.    Yes [provider]  albuterol (PROAIR HFA) 108 (90 Base) MCG/ACT inhaler Inhale 2 puffs into the lungs every 6 (six) hours as needed for  wheezing or shortness of breath. 04/23/19  Yes Coralyn Helling, MD  rivaroxaban (XARELTO) 20 MG TABS tablet Take 1 tablet (20 mg total) by mouth daily with supper. 07/08/19  Yes Parrett, Tammy S, NP  rosuvastatin (CRESTOR) 5 MG tablet Take 1 tablet (5 mg total) by mouth daily. 07/08/19  Yes Freddy Finner, NP  Vitamin D, Ergocalciferol, (DRISDOL) 1.25 MG (50000 UNIT) CAPS capsule Take 1 capsule (50,000 Units total) by mouth every 7 (seven) days. 07/08/19  Yes Freddy Finner, NP    Allergies    Patient has no known allergies.  Review of Systems   Review of Systems   Constitutional: Negative for chills and fever.  HENT: Negative for ear pain and sore throat.   Eyes: Negative for pain and visual disturbance.  Respiratory: Positive for shortness of breath. Negative for cough.   Cardiovascular: Positive for chest pain and palpitations.  Gastrointestinal: Negative for abdominal pain and vomiting.  Genitourinary: Negative for dysuria and hematuria.  Musculoskeletal: Negative for arthralgias and back pain.  Skin: Negative for color change and rash.  Neurological: Negative for seizures and syncope.  All other systems reviewed and are negative.   Physical Exam Updated Vital Signs BP (!) 142/99   Pulse 68   Temp 98.7 F (37.1 C) (Oral)   Resp (!) 21   Ht 5\' 8"  (1.727 m)   Wt 99.3 kg   LMP 06/20/2019   SpO2 99%   BMI 33.30 kg/m   Physical Exam Vitals and nursing note reviewed.  Constitutional:      General: She is not in acute distress.    Appearance: She is well-developed.  HENT:     Head: Normocephalic and atraumatic.  Eyes:     Conjunctiva/sclera: Conjunctivae normal.  Cardiovascular:     Rate and Rhythm: Normal rate and regular rhythm.     Heart sounds: No murmur.  Pulmonary:     Effort: Pulmonary effort is normal. No respiratory distress.     Breath sounds: Normal breath sounds.  Abdominal:     Palpations: Abdomen is soft.     Tenderness: There is no abdominal tenderness.  Musculoskeletal:     Cervical back: Neck supple.     Comments: RLE: Mild tenderness noted in right middle leg, right calf, no deformity, no swelling appreciated  Skin:    General: Skin is warm and dry.     Capillary Refill: Capillary refill takes less than 2 seconds.  Neurological:     General: No focal deficit present.     Mental Status: She is alert.     ED Results / Procedures / Treatments   Labs (all labs ordered are listed, but only abnormal results are displayed) Labs Reviewed  BASIC METABOLIC PANEL - Abnormal; Notable for the following  components:      Result Value   Glucose, Bld 113 (*)    Creatinine, Ser 1.31 (*)    GFR calc non Af Amer 48 (*)    GFR calc Af Amer 55 (*)    All other components within normal limits  CBC - Abnormal; Notable for the following components:   WBC 13.3 (*)    All other components within normal limits  I-STAT BETA HCG BLOOD, ED ([facility], [facility], [facility] ONLY)  TROPONIN I (HIGH SENSITIVITY)  TROPONIN I (HIGH SENSITIVITY)    EKG EKG Interpretation  Date/Time:  Monday July 15 2019 04:54:47 EDT Ventricular Rate:  70 PR Interval:  152 QRS Duration: 88 QT Interval:  374 QTC  Calculation: 403 R Axis:   9 Text Interpretation: Normal sinus rhythm Normal ECG Confirmed by Marianna Fuss ([id number]) on 07/15/2019 8:35:39 AM   Radiology DG Chest 2 View  Result Date: 07/15/2019 CLINICAL DATA:  Chest pain with shortness of breath EXAM: CHEST - 2 VIEW COMPARISON:  06/18/2019 FINDINGS: Coarse interstitial opacities with volume loss on the left. Normal heart size. There is known adenopathy by CT. No acute airspace disease, effusion, or pneumothorax. IMPRESSION: 1. Pulmonary fibrosis. 2. No acute superimposed finding. Electronically Signed   By: Marnee Spring M.D.   On: 07/15/2019 05:39   CT Angio Chest PE W and/or Wo Contrast  Result Date: 07/15/2019 CLINICAL DATA:  Shortness of breath, recent PE, on Xarelto EXAM: CT ANGIOGRAPHY CHEST WITH CONTRAST TECHNIQUE: Multidetector CT imaging of the chest was performed using the standard protocol during bolus administration of intravenous contrast. Multiplanar CT image reconstructions and MIPs were obtained to evaluate the vascular anatomy. CONTRAST:  65mL OMNIPAQUE IOHEXOL 350 MG/ML SOLN COMPARISON:  06/18/2019 FINDINGS: Cardiovascular: Satisfactory opacification of the pulmonary arteries to the segmental level. No evidence of pulmonary embolism. Normal heart size. No pericardial effusion. Enlargement of the main pulmonary artery up to 3.5 cm. Scattered aortic atherosclerosis.  Mediastinum/Nodes: Unchanged prominent mediastinal lymph nodes. Thyroid gland, trachea, and esophagus demonstrate no significant findings. Lungs/Pleura: Unchanged biapical pattern of pulmonary fibrosis featuring mild bronchiectasis and subpleural bronchiolectasis. No pleural effusion or pneumothorax. Upper Abdomen: No acute abnormality. Musculoskeletal: No chest wall abnormality. No acute or significant osseous findings. Review of the MIP images confirms the above findings. IMPRESSION: 1. Negative examination for pulmonary embolism. Previously noted segmental and more distal embolus on prior examination are not seen on current examination. 2. Redemonstrated biapical pattern of pulmonary fibrosis, in keeping with reported diagnosis of sarcoidosis or alternately fibrotic chronic hypersensitivity pneumonitis. 3.  Unchanged prominent mediastinal lymph nodes. 4. Enlargement of the main pulmonary artery up to 3.5 cm, as can be seen in pulmonary hypertension. 5.  Aortic Atherosclerosis (ICD10-I70.0). Electronically Signed   By: Lauralyn Primes M.D.   On: 07/15/2019 10:11    Procedures Procedures (including critical care time)  Medications Ordered in ED Medications  sodium chloride flush (NS) 0.9 % injection 3 mL (has no administration in time range)  iohexol (OMNIPAQUE) 350 MG/ML injection 75 mL (75 mLs Intravenous Contrast Given 07/15/19 0943)    ED Course  I have reviewed the triage vital signs and the nursing notes.  Pertinent labs & imaging results that were available during my care of the patient were reviewed by me and considered in my medical decision making (see chart for details).    MDM Rules/Calculators/A&P                      49 year old female presenting to ER with complaints of palpitations, shortness of breath.  Here well-appearing, no ongoing symptoms, stable vital signs.  EKG, troponin within normal limits.  Doubt ACS.  Given her history of pulmonary embolism and her new symptoms today,  check CTA chest which was negative for acute PE, prior PE has since resolved.  Her right leg ultrasound was negative for DVT. Did note baker's cyst which I suspect is incidental.  Recommend patient follow-up with primary doctor, given reassuring work-up today believe she can be discharged home and managed in the outpatient setting.    After the discussed management above, the patient was determined to be safe for discharge.  The patient was in agreement with this plan and all  questions regarding their care were answered.  ED return precautions were discussed and the patient will return to the ED with any significant worsening of condition.    Final Clinical Impression(s) / ED Diagnoses Final diagnoses:  Dyspnea, unspecified type  Palpitations  Right leg pain    Rx / DC Orders ED Discharge Orders    None       Lucrezia Starch, MD 07/15/19 [phone]

## 2019-07-15 NOTE — ED Triage Notes (Signed)
The pt awakened from a deep sleep one hour prior to arrival here with extreme sob  She reports that she is recovering from a pe march 8th  lmp now  The pt takes xarelto she still has rt calf paom

## 2019-07-15 NOTE — ED Notes (Signed)
Pt verbalized understanding of discharge paperwork and follow-up care. Ambulatory on discharge.  °

## 2019-07-15 NOTE — ED Notes (Signed)
Patient walked to the bathroom patient well put patient back on the monitor patient is resting with call bell in reach

## 2019-07-16 ENCOUNTER — Ambulatory Visit (HOSPITAL_COMMUNITY)
Admission: RE | Admit: 2019-07-16 | Discharge: 2019-07-16 | Disposition: A | Discharge status: Home / Self Care | Payer: 59 | Source: Ambulatory Visit | Attending: Nephrology | Admitting: Nephrology

## 2019-07-16 DIAGNOSIS — N170 Acute kidney failure with tubular necrosis: Secondary | ICD-10-CM | POA: Diagnosis not present

## 2019-07-17 ENCOUNTER — Encounter: Payer: 59 | Admitting: Podiatry

## 2019-07-17 ENCOUNTER — Telehealth (HOSPITAL_COMMUNITY): Payer: Self-pay | Admitting: Surgery

## 2019-07-17 NOTE — Telephone Encounter (Signed)
Pt had called this morning to let Dr. Ellin Saba know she was having intermittent numbness in her arms and legs and lightheadedness.  Also, the pt stated that she went to the ER on Monday due to leg pain, shortness of breath, and rapid heart rate.  The doppler on her leg was negative for a dvt.  The pt has an appointment with [place] on April 15.  Dr. Ellin Saba notified of the pt's symptoms and stated that the pt should keep her April 15 appointment with [place] and go to the ER if her symptoms worsen.  I called the pt and left a voicemail with these instructions and told the pt to call back if she had any more concerns.

## 2019-07-19 ENCOUNTER — Other Ambulatory Visit: Payer: Self-pay

## 2019-07-19 ENCOUNTER — Ambulatory Visit: Payer: 59

## 2019-07-19 DIAGNOSIS — G4733 Obstructive sleep apnea (adult) (pediatric): Secondary | ICD-10-CM | POA: Diagnosis not present

## 2019-07-19 DIAGNOSIS — I272 Pulmonary hypertension, unspecified: Secondary | ICD-10-CM

## 2019-07-22 ENCOUNTER — Telehealth: Payer: Self-pay | Admitting: Pulmonary Disease

## 2019-07-22 MED ORDER — ALBUTEROL SULFATE (2.5 MG/3ML) 0.083% IN NEBU: 2.5000 mg | INHALATION_SOLUTION | Freq: Four times a day (QID) | RESPIRATORY_TRACT | 1 refills | Status: DC | PRN

## 2019-07-22 NOTE — Telephone Encounter (Signed)
Yes that is fine

## 2019-07-22 NOTE — Telephone Encounter (Signed)
Called patient let her know order was sent it. Patient verbalized understanding, nothing further needed at this time.

## 2019-07-22 NOTE — Telephone Encounter (Signed)
Spoke with pt, she states she is needing a refill on the albuterol nebulizer medication that [facility] prescribed her a long time ago. I don't see it on her med list. TP can we send in the albuterol nebulizer medication? Please advise.

## 2019-07-23 ENCOUNTER — Encounter: Payer: Self-pay | Admitting: Orthopaedic Surgery

## 2019-07-23 ENCOUNTER — Telehealth: Payer: Self-pay | Admitting: Pulmonary Disease

## 2019-07-23 ENCOUNTER — Ambulatory Visit (INDEPENDENT_AMBULATORY_CARE_PROVIDER_SITE_OTHER): Payer: 59 | Admitting: Orthopaedic Surgery

## 2019-07-23 ENCOUNTER — Other Ambulatory Visit: Payer: Self-pay

## 2019-07-23 VITALS — BP 130/89 | HR 64 | Ht 65.0 in | Wt 214.0 lb

## 2019-07-23 DIAGNOSIS — G8929 Other chronic pain: Secondary | ICD-10-CM

## 2019-07-23 DIAGNOSIS — G4733 Obstructive sleep apnea (adult) (pediatric): Secondary | ICD-10-CM | POA: Diagnosis not present

## 2019-07-23 DIAGNOSIS — M25561 Pain in right knee: Secondary | ICD-10-CM

## 2019-07-23 NOTE — Telephone Encounter (Signed)
HST 07/22/19 >> AHI 9.5, SpO2 low 74%   Please inform her that her sleep study shows mild obstructive sleep apnea.  Please arrange for ROV with me or NP to discuss treatment options.

## 2019-07-23 NOTE — Progress Notes (Signed)
Patient Hailey Ross, female DOB:13-Mar-1971, 49 y.o. UMP:[id number]  Chief Complaint  Patient presents with  . Knee Pain    right     HPI  Hailey Ross is a 49 y.o. female who has continued pain and swelling of her right knee and giving way.  I am concerned about a medial meniscus tear. I will get a MRI as she has not improved with conservative treatment.   Body mass index is 35.61 kg/m.  ROS  Review of Systems  Constitutional: Positive for activity change.  Musculoskeletal: Positive for arthralgias, gait problem and joint swelling.  All other systems reviewed and are negative.   All other systems reviewed and are negative.  The following is a summary of the past history medically, past history surgically, known current medicines, social history and family history.  This information is gathered electronically by the computer from prior information and documentation.  I review this each visit and have found including this information at this point in the chart is beneficial and informative.    Past Medical History:  Diagnosis Date  . Acute cholecystitis 08/24/2016  . Acute kidney injury superimposed on chronic kidney disease (HCC) 06/20/2019  . Acute respiratory failure with hypoxia (HCC) 06/19/2019  . Annual visit for general adult medical examination with abnormal findings 04/16/2019  . Bronchiectasis (HCC)   . CKD (chronic kidney disease)   . Lightheadedness 05/10/2019  . PE (pulmonary thromboembolism) (HCC) 06/2019  . Pre-syncope 04/16/2019  . Sarcoidosis   . Sarcoidosis   . Smoker 02/12/2014    Past Surgical History:  Procedure Laterality Date  . CHOLECYSTECTOMY N/A 08/25/2016   Procedure: LAPAROSCOPIC CHOLECYSTECTOMY;  Surgeon: Abigail Miyamoto, MD;  Location: [facility] OR;  Service: General;  Laterality: N/A;  . TUBAL LIGATION  02/1993    Family History  Problem Relation Age of Onset  . Hypertension Mother   . Cancer Mother     Social History Social  History   Tobacco Use  . Smoking status: Former Smoker    Packs/day: 1.00    Years: 20.00    Pack years: 20.00    Types: Cigarettes    Quit date: 03/21/2015    Years since quitting: 4.3  . Smokeless tobacco: Never Used  Substance Use Topics  . Alcohol use: Not Currently    Alcohol/week: 0.0 standard drinks    Comment: Rare  . Drug use: No    No Known Allergies  Current Outpatient Medications  Medication Sig Dispense Refill  . acetaminophen (TYLENOL) 500 MG tablet Take 500 mg by mouth every 4 (four) hours as needed for mild pain, moderate pain or headache.     . albuterol (PROAIR HFA) 108 (90 Base) MCG/ACT inhaler Inhale 2 puffs into the lungs every 6 (six) hours as needed for wheezing or shortness of breath. 6.7 g 5  . albuterol (PROVENTIL) (2.5 MG/3ML) 0.083% nebulizer solution Take 3 mLs (2.5 mg total) by nebulization every 6 (six) hours as needed for wheezing or shortness of breath. 75 mL 1  . rivaroxaban (XARELTO) 20 MG TABS tablet Take 1 tablet (20 mg total) by mouth daily with supper. 30 tablet 5  . rosuvastatin (CRESTOR) 5 MG tablet Take 1 tablet (5 mg total) by mouth daily. 30 tablet 1  . Vitamin D, Ergocalciferol, (DRISDOL) 1.25 MG (50000 UNIT) CAPS capsule Take 1 capsule (50,000 Units total) by mouth every 7 (seven) days. 12 capsule 1   No current facility-administered medications for this visit.     Physical  Exam  Blood pressure 130/89, pulse 64, height 5\' 5"  (1.651 m), weight 214 lb (97.1 kg).  Constitutional: overall normal hygiene, normal nutrition, well developed, normal grooming, normal body habitus. Assistive device:none  Musculoskeletal: gait and station Limp right, muscle tone and strength are normal, no tremors or atrophy is present.  .  Neurological: coordination overall normal.  Deep tendon reflex/nerve stretch intact.  Sensation normal.  Cranial nerves II-XII intact.   Skin:   Normal overall no scars, lesions, ulcers or rashes. No  psoriasis.  Psychiatric: Alert and oriented x 3.  Recent memory intact, remote memory unclear.  Normal mood and affect. Well groomed.  Good eye contact.  Cardiovascular: overall no swelling, no varicosities, no edema bilaterally, normal temperatures of the legs and arms, no clubbing, cyanosis and good capillary refill.  Lymphatic: palpation is normal.  Right knee has effusion, crepitus, ROM 0 to 105, limp right, positive medial [place], [place] intact.  All other systems reviewed and are negative   The patient has been educated about the nature of the problem(s) and counseled on treatment options.  The patient appeared to understand what I have discussed and is in agreement with it.  Encounter Diagnosis  Name Primary?  . Chronic pain of right knee Yes    PLAN Call if any problems.  Precautions discussed.  Continue current medications.   Return to clinic 2 weeks   Get MRI of the right knee.  Electronically Signed Sanjuana Kava, MD 4/13/20219:46 AM

## 2019-07-23 NOTE — Patient Instructions (Signed)
Note for out of work until after MRI Review appointment here.

## 2019-07-25 ENCOUNTER — Other Ambulatory Visit: Payer: Self-pay

## 2019-07-25 ENCOUNTER — Inpatient Hospital Stay (HOSPITAL_COMMUNITY): Payer: 59 | Attending: Hematology | Admitting: Hematology

## 2019-07-25 ENCOUNTER — Encounter (HOSPITAL_COMMUNITY): Payer: Self-pay | Admitting: Hematology

## 2019-07-25 DIAGNOSIS — Z7901 Long term (current) use of anticoagulants: Secondary | ICD-10-CM | POA: Insufficient documentation

## 2019-07-25 DIAGNOSIS — Z87891 Personal history of nicotine dependence: Secondary | ICD-10-CM | POA: Insufficient documentation

## 2019-07-25 DIAGNOSIS — Z86711 Personal history of pulmonary embolism: Secondary | ICD-10-CM | POA: Insufficient documentation

## 2019-07-25 DIAGNOSIS — Z79899 Other long term (current) drug therapy: Secondary | ICD-10-CM | POA: Diagnosis not present

## 2019-07-25 DIAGNOSIS — Z8249 Family history of ischemic heart disease and other diseases of the circulatory system: Secondary | ICD-10-CM | POA: Diagnosis not present

## 2019-07-25 DIAGNOSIS — I2699 Other pulmonary embolism without acute cor pulmonale: Secondary | ICD-10-CM | POA: Diagnosis not present

## 2019-07-25 DIAGNOSIS — D869 Sarcoidosis, unspecified: Secondary | ICD-10-CM | POA: Diagnosis not present

## 2019-07-25 DIAGNOSIS — Z809 Family history of malignant neoplasm, unspecified: Secondary | ICD-10-CM | POA: Diagnosis not present

## 2019-07-25 NOTE — Patient Instructions (Addendum)
[facility] at [facility] Discharge Instructions  You were seen today by Dr. Ellin Saba. He went over your recent test results. Start checking your blood pressure at home. Continue taking Eliquis as directed. He will see you back in 4 months for labs and follow up.   Thank you for choosing [facility] at [facility] to provide your oncology and hematology care.  To afford each patient quality time with our provider, please arrive at least 15 minutes before your scheduled appointment time.   If you have a lab appointment with the Cancer Center please come in thru the  Main Entrance and check in at the main information desk  You need to re-schedule your appointment should you arrive 10 or more minutes late.  We strive to give you quality time with our providers, and arriving late affects you and other patients whose appointments are after yours.  Also, if you no show three or more times for appointments you may be dismissed from the clinic at the providers discretion.     Again, thank you for choosing [facility].  Our hope is that these requests will decrease the amount of time that you wait before being seen by our physicians.       _____________________________________________________________  Should you have questions after your visit to [facility], please contact our office at [phone] between the hours of 8:00 a.m. and 4:30 p.m.  Voicemails left after 4:00 p.m. will not be returned until the following business day.  For prescription refill requests, have your pharmacy contact our office and allow 72 hours.    Cancer Center Support Programs:   > Cancer Support Group  2nd Tuesday of the month 1pm-2pm, Journey Room

## 2019-07-25 NOTE — Telephone Encounter (Signed)
Spoke with patient. She is aware of results and verbalized understanding. She has been scheduled to follow up with TP on 08/02/19 at 2pm.   Nothing further needed at time of call.

## 2019-07-25 NOTE — Progress Notes (Signed)
[address], [place] [address]   CLINIC:  Medical Oncology/Hematology  PCP:  Perlie Mayo, NP [place] [address] [phone]   REASON FOR VISIT:  Follow-up for unprovoked pulmonary embolism.  CURRENT THERAPY: Xarelto.    INTERVAL HISTORY:  Ms. Olena Heckle 49 y.o. female seen for follow-up of unprovoked pulmonary embolism.  She is taking Xarelto without any problems.  Appetite and energy levels are 100%.  Occasional dizziness when she sits and stands only lasting few seconds.  Also reports buzzing in the ears bilaterally.  She has used meclizine without help.  Also reports decreased hearing in the right ear.    REVIEW OF SYSTEMS:  Review of Systems  Neurological: Positive for dizziness.  All other systems reviewed and are negative.    PAST MEDICAL/SURGICAL HISTORY:  Past Medical History:  Diagnosis Date  . Acute cholecystitis 08/24/2016  . Acute kidney injury superimposed on chronic kidney disease ([place]) 06/20/2019  . Acute respiratory failure with hypoxia ([place]) 06/19/2019  . Annual visit for general adult medical examination with abnormal findings 04/16/2019  . Bronchiectasis ([place])   . CKD (chronic kidney disease)   . Lightheadedness 05/10/2019  . PE (pulmonary thromboembolism) ([place]) 06/2019  . Pre-syncope 04/16/2019  . Sarcoidosis   . Sarcoidosis   . Smoker 02/12/2014   Past Surgical History:  Procedure Laterality Date  . CHOLECYSTECTOMY N/A 08/25/2016   Procedure: LAPAROSCOPIC CHOLECYSTECTOMY;  Surgeon: Coralie Keens, MD;  Location: [facility];  Service: General;  Laterality: N/A;  . TUBAL LIGATION  02/1993     SOCIAL HISTORY:  Social History   Socioeconomic History  . Marital status: Single    Spouse name: Not on file  . Number of children: 3  . Years of education: Not on file  . Highest education level: High school graduate  Occupational History  . 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 Smoking  status: Former Smoker    Packs/day: 1.00    Years: 20.00    Pack years: 20.00    Types: Cigarettes    Quit date: 03/21/2015    Years since quitting: 4.3  . Smokeless tobacco: Never Used  Substance and Sexual Activity  . Alcohol use: Not Currently    Alcohol/week: 0.0 standard drinks    Comment: Rare  . Drug use: No  . Sexual activity: Yes    Birth control/protection: Surgical  Other Topics Concern  . Not on file  Social History Narrative   Live with partners [name]-19 years   3 children.    1-[name] (girl) 27 at home with her: 2 grandchildren in her home as well    [name]- 25 expecting   [name]-25      Enjoy: read, play games on tablet, grandkids      Diet: Veggies, does not eat a lot of fried foods, enjoys chicken and fruit   Caffeine: sodas and coffee (with sugar)   Water: Does not drink a lot at all      Wears seat beat   Does not wear sunscreen   Smoke and carbon monoxide detectors   Does not use phone while driving    Social Determinants of Health   Financial Resource Strain: Low Risk   . Difficulty of Paying Living Expenses: Not hard at all  Food Insecurity: No Food Insecurity  . Worried About Charity fundraiser in the Last Year: Never true  . Ran Out of Food in the Last  Year: Never true  Transportation Needs: No Transportation Needs  . Lack of Transportation (Medical): No  . Lack of Transportation (Non-Medical): No  Physical Activity: Sufficiently Active  . Days of Exercise per Week: 7 days  . Minutes of Exercise per Session: 60 min  Stress: No Stress Concern Present  . Feeling of Stress : Not at all  Social Connections: Somewhat Isolated  . Frequency of Communication with Friends and Family: More than three times a week  . Frequency of Social Gatherings with Friends and Family: Once a week  . Attends Religious Services: More than 4 times per year  . Active Member of Clubs or Organizations: No  . Attends Banker Meetings: Never  . Marital  Status: Never married  Intimate Partner Violence: Not At Risk  . Fear of Current or Ex-Partner: No  . Emotionally Abused: No  . Physically Abused: No  . Sexually Abused: No    FAMILY HISTORY:  Family History  Problem Relation Age of Onset  . Hypertension Mother   . Cancer Mother     CURRENT MEDICATIONS:  Outpatient Encounter Medications as of 07/25/2019  Medication Sig Note  . albuterol (PROAIR HFA) 108 (90 Base) MCG/ACT inhaler Inhale 2 puffs into the lungs every 6 (six) hours as needed for wheezing or shortness of breath. 07/12/2019: Patient has if needed but has not had to take recently  . albuterol (PROVENTIL) (2.5 MG/3ML) 0.083% nebulizer solution Take 3 mLs (2.5 mg total) by nebulization every 6 (six) hours as needed for wheezing or shortness of breath.   . rivaroxaban (XARELTO) 20 MG TABS tablet Take 1 tablet (20 mg total) by mouth daily with supper.   . rosuvastatin (CRESTOR) 5 MG tablet Take 1 tablet (5 mg total) by mouth daily.   . Vitamin D, Ergocalciferol, (DRISDOL) 1.25 MG (50000 UNIT) CAPS capsule Take 1 capsule (50,000 Units total) by mouth every 7 (seven) days. 07/12/2019: Patient takes on Sundays  . acetaminophen (TYLENOL) 500 MG tablet Take 500 mg by mouth every 4 (four) hours as needed for mild pain, moderate pain or headache.     No facility-administered encounter medications on file as of 07/25/2019.    ALLERGIES:  No Known Allergies   PHYSICAL EXAM:  ECOG Performance status: 0  There were no vitals filed for this visit. There were no vitals filed for this visit.  Physical Exam Vitals reviewed.  Constitutional:      Appearance: Normal appearance.  Neurological:     General: No focal deficit present.     Mental Status: She is alert and oriented to person, place, and time.  Psychiatric:        Mood and Affect: Mood normal.        Behavior: Behavior normal.      LABORATORY DATA:  I have reviewed the labs as listed.  CBC    Component Value Date/Time    WBC 13.3 (H) 07/15/2019 0506   RBC 4.34 07/15/2019 0506   HGB 12.1 07/15/2019 0506   HCT 38.4 07/15/2019 0506   PLT 368 07/15/2019 0506   MCV 88.5 07/15/2019 0506   MCH 27.9 07/15/2019 0506   MCHC 31.5 07/15/2019 0506   RDW 13.1 07/15/2019 0506   LYMPHSABS 3.7 07/12/2019 1404   MONOABS 0.7 07/12/2019 1404   EOSABS 0.1 07/12/2019 1404   BASOSABS 0.1 07/12/2019 1404   CMP Latest Ref Rng & Units 07/15/2019 07/12/2019 07/04/2019  Glucose 70 - 99 mg/dL 409(W) 88 88  BUN 6 -  20 mg/dL 18 20 18   Creatinine 0.44 - 1.00 mg/dL ) 7.59(F) 6.38(G)  Sodium 135 - 145 mmol/L 140 137 137  Potassium 3.5 - 5.1 mmol/L 4.0 3.9 4.0  Chloride 98 - 111 mmol/L 106 106 105  CO2 22 - 32 mmol/L 25 25 24   Calcium 8.9 - 10.3 mg/dL 9.1 9.0 9.1  Total Protein 6.1 - 8.1 g/dL - - 6.9  Total Bilirubin 0.2 - 1.2 mg/dL - - 0.5  Alkaline Phos 38 - 126 U/L - - -  AST 10 - 35 U/L - - 17  ALT 6 - 29 U/L - - 18       DIAGNOSTIC IMAGING:  I have independently reviewed the scans and discussed with the patient.     ASSESSMENT & PLAN:   Bilateral pulmonary embolism (HCC) 1.  Unprovoked bilateral pulmonary embolism: -Presentation with right knee pain and swelling and on and off lightheadedness since January 2021.  Presented to the ER on 06/18/2019 with worsening lightheadedness and coughing up blood-streaked sputum. A CT angiogram on 06/18/2019 showed peripheral segmental and subsegmental bilateral pulmonary emboli with no right heart strain.  Bulky mediastinal and hilar lymph nodes and pulmonary findings consistent with history of sarcoidosis. -Bilateral leg Doppler was negative on the same day for DVT. -He is taking Xarelto without any bleeding issues.  She quit smoking 5 years ago and smoked 1 pack/day for 22 years. -We discussed results of blood tests.  Lupus anticoagulant was positive but could be false positive.  Anticardiolipin antibodies were negative.  Factor V Leiden and prothrombin gene mutation were  negative. -She was recommended indefinite anticoagulation because of unprovoked nature.  I will check her back 4 months with D-dimer.  After that we will switch her to once a year visits.  2.  Sarcoidosis: -Diagnosed in January 2018, treated with prednisone for 1 and half year.  She is weaned off now and uses inhaler.  She follows up with Dr. 08/18/2019.  3.  Decreased right ear hearing: -She reports buzzing in the ears on and off and decreased right ear hearing.  She has used meclizine in the past without help. -We will make referral to Dr. 01-11-1999.      Orders placed this encounter:  Orders Placed This Encounter  Procedures  . D-dimer, quantitative      Craige Cotta, MD [facility] [phone]

## 2019-07-25 NOTE — Assessment & Plan Note (Signed)
1.  Unprovoked bilateral pulmonary embolism: -Presentation with right knee pain and swelling and on and off lightheadedness since January 2021.  Presented to the ER on 06/18/2019 with worsening lightheadedness and coughing up blood-streaked sputum. A CT angiogram on 06/18/2019 showed peripheral segmental and subsegmental bilateral pulmonary emboli with no right heart strain.  Bulky mediastinal and hilar lymph nodes and pulmonary findings consistent with history of sarcoidosis. -Bilateral leg Doppler was negative on the same day for DVT. -He is taking Xarelto without any bleeding issues.  She quit smoking 5 years ago and smoked 1 pack/day for 22 years. -We discussed results of blood tests.  Lupus anticoagulant was positive but could be false positive.  Anticardiolipin antibodies were negative.  Factor V Leiden and prothrombin gene mutation were negative. -She was recommended indefinite anticoagulation because of unprovoked nature.  I will check her back 4 months with D-dimer.  After that we will switch her to once a year visits.  2.  Sarcoidosis: -Diagnosed in January 2018, treated with prednisone for 1 and half year.  She is weaned off now and uses inhaler.  She follows up with Dr. Craige Cotta.  3.  Decreased right ear hearing: -She reports buzzing in the ears on and off and decreased right ear hearing.  She has used meclizine in the past without help. -We will make referral to Dr. Suszanne Conners.

## 2019-07-30 ENCOUNTER — Other Ambulatory Visit: Payer: Self-pay

## 2019-07-30 ENCOUNTER — Ambulatory Visit
Admission: RE | Admit: 2019-07-30 | Discharge: 2019-07-30 | Disposition: A | Discharge status: Home / Self Care | Payer: 59 | Source: Ambulatory Visit | Attending: Orthopaedic Surgery | Admitting: Orthopaedic Surgery

## 2019-07-30 DIAGNOSIS — M25561 Pain in right knee: Secondary | ICD-10-CM

## 2019-07-30 DIAGNOSIS — G8929 Other chronic pain: Secondary | ICD-10-CM

## 2019-08-02 ENCOUNTER — Ambulatory Visit (INDEPENDENT_AMBULATORY_CARE_PROVIDER_SITE_OTHER): Payer: 59 | Admitting: Adult Health

## 2019-08-02 ENCOUNTER — Encounter: Payer: Self-pay | Admitting: Adult Health

## 2019-08-02 ENCOUNTER — Other Ambulatory Visit: Payer: Self-pay

## 2019-08-02 VITALS — BP 112/62 | HR 65 | Temp 98.3°F | Ht 65.0 in | Wt 220.2 lb

## 2019-08-02 DIAGNOSIS — I2699 Other pulmonary embolism without acute cor pulmonale: Secondary | ICD-10-CM | POA: Diagnosis not present

## 2019-08-02 DIAGNOSIS — D860 Sarcoidosis of lung: Secondary | ICD-10-CM

## 2019-08-02 DIAGNOSIS — G4733 Obstructive sleep apnea (adult) (pediatric): Secondary | ICD-10-CM

## 2019-08-02 DIAGNOSIS — I272 Pulmonary hypertension, unspecified: Secondary | ICD-10-CM | POA: Diagnosis not present

## 2019-08-02 NOTE — Patient Instructions (Addendum)
Continue on Xarelto .  2 D echo in June .  Advance activity as tolerated.  Set up for CPAP At bedtime   Wear for at least 4-6 hrs or more each night .  Do not drive if sleepy .  Work on healthy weight loss .  Follow up in 2-3  months with Dr. Craige Cotta or Raylinn Kosar NP  With PFT and As needed

## 2019-08-02 NOTE — Progress Notes (Signed)
@Patient  ID: Hailey Ross, female    DOB: 1970/04/25, 49 y.o.   MRN: [id number]  Chief Complaint  Patient presents with  . Follow-up    OSA     Referring provider: Perlie Mayo, NP  HPI: 49 year old female never smoker followed for pulmonary sarcoidosis and bronchiectasis Diagnosed with unprovoked pulmonary emboli on March 2021  TEST/EVENTS :  Labs 04/21/15 >> HIV negative, Quantiferon gold negative, ACE 195, ANA, RF negative, ESR 32 PFT 05/26/15 >> FEV1 1.67 (66%), FEV1% 88, TLC 2/78 (53%), DLCO 36%  Chest imaging:  CT chest 03/18/15 >> biapical thickening with traction BTX, BTX RML and lingula, subcarinal LAN 1.7 cm, patchy increased interstitial markings HRCT chest 03/07/18 >> borderline LAN, patchy GGO, septal thickening, architectural distortion, cylindrical and varicose BTX  CT chest June 18, 2019 showed peripheral segmental and subsegmental bilateral pulmonary emboli, bulky mediastinal and hilar lymph nodes, bilateral apical and lingular honeycombing and bilateral upper lobe mild traction bronchiectasis.  Nonspecific groundglass opacities in the left base and right middle lobe.  Not significantly changed   08/02/2019 Follow up : PE, sarcoid, bronchiectasis, sleep apnea Patient was hospitalized last month for episodes of shortness of breath and hemoptysis.  She was hospitalized and found to have bilateral pulmonary embolism (unprovoked) venous Dopplers were negative for DVT.  2D echo showed a preserved EF.  Mildly reduced right ventricular systolic function and moderately elevated pulmonary artery systolic pressure at 57 mmHg.  Moderate grade 3 protruding plaque involving the distal aortic arch and proximal descending aorta.  Patient was started on Xarelto.   At last visit patient was set up for a 2D echo to be repeated in 3 months.  She was continued on Xarelto.  And referred to hematology for an unprovoked PE. She says she is feeling better with decreased shortness of  breath.  Patient did have an episode of shortness of breath couple weeks ago.  And went to the emergency room.  CT chest was done and showed resolution of her pulmonary emboli. Patient was seen by hematology.  Recommendation for lifelong anticoagulation due to unprovoked PE   patient was also set up for a home sleep study .  She had some daytime sleepiness, restless sleep and snoring.  Home sleep study was done July 22, 2019.  This showed mild sleep apnea with an AHI at 9.5/hour and SPO2 low at 74%.  We discussed her sleep test results and went over treatment options including weight loss, oral appliance and CPAP.  She will like to proceed with CPAP.      No Known Allergies  Immunization History  Administered Date(s) Administered  . Influenza,inj,Quad PF,6+ Mos 12/21/2018  . Influenza,inj,Quad PF,6-35 Mos 01/10/2019  . Pneumococcal Polysaccharide-23 02/12/2014  . Tdap 02/12/2014    Past Medical History:  Diagnosis Date  . Acute cholecystitis 08/24/2016  . Acute kidney injury superimposed on chronic kidney disease ([place]) 06/20/2019  . Acute respiratory failure with hypoxia ([place]) 06/19/2019  . Annual visit for general adult medical examination with abnormal findings 04/16/2019  . Bronchiectasis ([place])   . CKD (chronic kidney disease)   . Lightheadedness 05/10/2019  . PE (pulmonary thromboembolism) ([place]) 06/2019  . Pre-syncope 04/16/2019  . Sarcoidosis   . Sarcoidosis   . Smoker 02/12/2014    Tobacco History: Social History   Tobacco Use  Smoking Status Former Smoker  . Packs/day: 1.00  . Years: 20.00  . Pack years: 20.00  . Types: Cigarettes  . Quit date: 03/21/2015  .  Years since quitting: 4.3  Smokeless Tobacco Never Used   Counseling given: Not Answered   Outpatient Medications Prior to Visit  Medication Sig Dispense Refill  . acetaminophen (TYLENOL) 500 MG tablet Take 500 mg by mouth every 4 (four) hours as needed for mild pain, moderate pain or headache.     . albuterol  (PROAIR HFA) 108 (90 Base) MCG/ACT inhaler Inhale 2 puffs into the lungs every 6 (six) hours as needed for wheezing or shortness of breath. 6.7 g 5  . albuterol (PROVENTIL) (2.5 MG/3ML) 0.083% nebulizer solution Take 3 mLs (2.5 mg total) by nebulization every 6 (six) hours as needed for wheezing or shortness of breath. 75 mL 1  . rivaroxaban (XARELTO) 20 MG TABS tablet Take 1 tablet (20 mg total) by mouth daily with supper. 30 tablet 5  . rosuvastatin (CRESTOR) 5 MG tablet Take 1 tablet (5 mg total) by mouth daily. 30 tablet 1  . Vitamin D, Ergocalciferol, (DRISDOL) 1.25 MG (50000 UNIT) CAPS capsule Take 1 capsule (50,000 Units total) by mouth every 7 (seven) days. 12 capsule 1   No facility-administered medications prior to visit.     Review of Systems:   Constitutional:   No  weight loss, night sweats,  Fevers, chills, + fatigue, or  lassitude.  HEENT:   No headaches,  Difficulty swallowing,  Tooth/dental problems, or  Sore throat,                No sneezing, itching, ear ache, nasal congestion, post nasal drip,   CV:  No chest pain,  Orthopnea, PND, swelling in lower extremities, anasarca, dizziness, palpitations, syncope.   GI  No heartburn, indigestion, abdominal pain, nausea, vomiting, diarrhea, change in bowel habits, loss of appetite, bloody stools.   Resp: No shortness of breath with exertion or at rest.  No excess mucus, no productive cough,  No non-productive cough,  No coughing up of blood.  No change in color of mucus.  No wheezing.  No chest wall deformity  Skin: no rash or lesions.  GU: no dysuria, change in color of urine, no urgency or frequency.  No flank pain, no hematuria   MS: Right knee pain   Physical Exam  BP 112/62 (BP Location: Left Arm, Cuff Size: Normal)   Pulse 65   Temp 98.3 F (36.8 C) (Temporal)   Ht 5' 5"  (1.651 m)   Wt 220 lb 3.2 oz (99.9 kg)   SpO2 100% Comment: RA  BMI 36.64 kg/m   GEN: A/Ox3; pleasant , NAD, well nourished    HEENT:   [place]/AT,   , NOSE-clear, THROAT-clear, no lesions, no postnasal drip or exudate noted.   NECK:  Supple w/ fair ROM; no JVD; normal carotid impulses w/o bruits; no thyromegaly or nodules palpated; no lymphadenopathy.    RESP  Clear  P & A; w/o, wheezes/ rales/ or rhonchi. no accessory muscle use, no dullness to percussion  CARD:  RRR, no m/r/g, no peripheral edema, pulses intact, no cyanosis or clubbing.  GI:   Soft & nt; nml bowel sounds; no organomegaly or masses detected.   Musco: Warm bil, no deformities or joint swelling noted.   Neuro: alert, no focal deficits noted.    Skin: Warm, no lesions or rashes    Lab Results:  CBC    Component Value Date/Time   WBC 13.3 (H) 07/15/2019 0506   RBC 4.34 07/15/2019 0506   HGB 12.1 07/15/2019 0506   HCT 38.4 07/15/2019 0506  PLT 368 07/15/2019 0506   MCV 88.5 07/15/2019 0506   MCH 27.9 07/15/2019 0506   MCHC 31.5 07/15/2019 0506   RDW 13.1 07/15/2019 0506   LYMPHSABS 3.7 07/12/2019 1404   MONOABS 0.7 07/12/2019 1404   EOSABS 0.1 07/12/2019 1404   BASOSABS 0.1 07/12/2019 1404    BMET    Component Value Date/Time   NA 140 07/15/2019 0506   K 4.0 07/15/2019 0506   CL 106 07/15/2019 0506   CO2 25 07/15/2019 0506   GLUCOSE 113 (H) 07/15/2019 0506   BUN 18 07/15/2019 0506   CREATININE 1.31 (H) 07/15/2019 0506   CREATININE 1.36 (H) 07/04/2019 1355   CALCIUM 9.1 07/15/2019 0506   GFRNONAA 48 (L) 07/15/2019 0506   GFRNONAA 46 (L) 07/04/2019 1355   GFRAA 55 (L) 07/15/2019 0506   GFRAA 53 (L) 07/04/2019 1355    BNP No results found for: BNP  ProBNP No results found for: PROBNP  Imaging: DG Chest 2 View  Result Date: 07/15/2019 CLINICAL DATA:  Chest pain with shortness of breath EXAM: CHEST - 2 VIEW COMPARISON:  06/18/2019 FINDINGS: Coarse interstitial opacities with volume loss on the left. Normal heart size. There is known adenopathy by CT. No acute airspace disease, effusion, or pneumothorax. IMPRESSION: 1. Pulmonary  fibrosis. 2. No acute superimposed finding. Electronically Signed   By: Monte Fantasia M.D.   On: 07/15/2019 05:39   CT Angio Chest PE W and/or Wo Contrast  Result Date: 07/15/2019 CLINICAL DATA:  Shortness of breath, recent PE, on Xarelto EXAM: CT ANGIOGRAPHY CHEST WITH CONTRAST TECHNIQUE: Multidetector CT imaging of the chest was performed using the standard protocol during bolus administration of intravenous contrast. Multiplanar CT image reconstructions and MIPs were obtained to evaluate the vascular anatomy. CONTRAST:  74m OMNIPAQUE IOHEXOL 350 MG/ML SOLN COMPARISON:  06/18/2019 FINDINGS: Cardiovascular: Satisfactory opacification of the pulmonary arteries to the segmental level. No evidence of pulmonary embolism. Normal heart size. No pericardial effusion. Enlargement of the main pulmonary artery up to 3.5 cm. Scattered aortic atherosclerosis. Mediastinum/Nodes: Unchanged prominent mediastinal lymph nodes. Thyroid gland, trachea, and esophagus demonstrate no significant findings. Lungs/Pleura: Unchanged biapical pattern of pulmonary fibrosis featuring mild bronchiectasis and subpleural bronchiolectasis. No pleural effusion or pneumothorax. Upper Abdomen: No acute abnormality. Musculoskeletal: No chest wall abnormality. No acute or significant osseous findings. Review of the MIP images confirms the above findings. IMPRESSION: 1. Negative examination for pulmonary embolism. Previously noted segmental and more distal embolus on prior examination are not seen on current examination. 2. Redemonstrated biapical pattern of pulmonary fibrosis, in keeping with reported diagnosis of sarcoidosis or alternately fibrotic chronic hypersensitivity pneumonitis. 3.  Unchanged prominent mediastinal lymph nodes. 4. Enlargement of the main pulmonary artery up to 3.5 cm, as can be seen in pulmonary hypertension. 5.  Aortic Atherosclerosis (ICD10-I70.0). Electronically Signed   By: AEddie CandleM.D.   On: 07/15/2019 10:11    [place]  Result Date: 07/16/2019 CLINICAL DATA:  Initial evaluation for acute renal failure with tubular necrosis. EXAM: RENAL / URINARY TRACT ULTRASOUND COMPLETE COMPARISON:  Prior CT from 08/23/2016. FINDINGS: Right Kidney: Renal measurements: 9.7 x 3.4 x 5.3 cm = volume: 94 mL. Echogenicity within normal limits. No nephrolithiasis or hydronephrosis. No focal renal mass. Left Kidney: Renal measurements: 10.0 x 4.7 x 4.9 cm = volume: 124 mL. Echogenicity within normal limits. No nephrolithiasis or hydronephrosis. There is question of a 1.6 cm focal exophytic lesion versus lobulation at the interpolar left kidney, not well evaluated due to technique  and poor visualization. No other focal renal mass. Bladder: Appears normal for degree of bladder distention. Other: None. IMPRESSION: 1. Technically limited exam due to technique, with poor visualization of the kidneys. 2. Question focal 1.6 cm contour abnormality at the interpolar left kidney. While this finding could simply reflect focal renal lobulation, an underlying lesion is difficult to exclude on this examination. Further assessment with dedicated cross-sectional imaging of the abdomen suggested for further characterization. 3. Otherwise negative renal ultrasound with normal renal echogenicity. No hydronephrosis. Electronically Signed   By: Jeannine Boga M.D.   On: 07/16/2019 23:45   MR Knee Right Wo Contrast  Result Date: 07/30/2019 CLINICAL DATA:  Chronic knee pain. Instability. Pain for 3 months. EXAM: MRI OF THE RIGHT KNEE WITHOUT CONTRAST TECHNIQUE: Multiplanar, multisequence MR imaging of the knee was performed. No intravenous contrast was administered. COMPARISON:  None. FINDINGS: MENISCI Medial meniscus: Tiny radial tear of the free edge of the body of the medial meniscus. Lateral meniscus:  Intact. LIGAMENTS Cruciates:  Intact ACL and PCL. Collaterals: Medial collateral ligament is intact. Lateral collateral ligament complex is intact.  CARTILAGE Patellofemoral: Partial-thickness cartilage loss of the medial and lateral patellar facets with an area of full-thickness cartilage loss of the lateral patellar facet adjacent to the patellar apex with subchondral reactive marrow edema. Cartilage fissuring of the trochlear groove. Medial: High-grade partial-thickness cartilage loss of the medial tibial plateau. High-grade partial-thickness cartilage loss with areas of full-thickness cartilage loss of the weight-bearing surface of the medial femoral condyle. Lateral: Mild partial-thickness cartilage loss of the lateral femorotibial compartment with a small focal area of full-thickness cartilage loss of the weight-bearing surface of the lateral femoral condyle. Joint: Large joint effusion. Mild edema in superior Hoffa's fat. No plical thickening. Popliteal Fossa: Small Baker's cyst with a nodular intermediate signal area within the Baker's cyst consistent with synovitis. Intact popliteus tendon. Extensor Mechanism: Intact quadriceps tendon. Intact patellar tendon. Intact medial patellar retinaculum. Intact lateral patellar retinaculum. Intact MPFL. Bones: No acute osseous abnormality. No aggressive osseous lesion. Tricompartmental tiny marginal osteophytes. Other: No fluid collection or hematoma. Muscles are normal. IMPRESSION: 1. Tricompartmental cartilage abnormalities as described above. 2. Tiny radial tear of the free edge of the body of the medial meniscus. 3. Large joint effusion. 4. Small Baker's cyst with synovitis. Electronically Signed   By: Kathreen Devoid   On: 07/30/2019 08:54   VAS [place] LOWER EXTREMITY VENOUS (DVT) (ONLY [facility] & [facility] 7a-7p)  Result Date: 07/15/2019  Lower Venous DVTStudy Indications: Swelling. Other Indications: Patient reports SOB with right calf pain. Anticoagulation: Xarelto. Comparison Study: No priors. Performing Technologist: Oda Cogan RDMS, RVT  Examination Guidelines: A complete evaluation includes B-mode imaging,  spectral Doppler, color Doppler, and power Doppler as needed of all accessible portions of each vessel. Bilateral testing is considered an integral part of a complete examination. Limited examinations for reoccurring indications may be performed as noted. The reflux portion of the exam is performed with the patient in reverse Trendelenburg.  +---------+---------------+---------+-----------+----------+--------------+ RIGHT    CompressibilityPhasicitySpontaneityPropertiesThrombus Aging +---------+---------------+---------+-----------+----------+--------------+ CFV      Full           Yes      Yes                                 +---------+---------------+---------+-----------+----------+--------------+ SFJ      Full                                                        +---------+---------------+---------+-----------+----------+--------------+  FV Prox  Full                                                        +---------+---------------+---------+-----------+----------+--------------+ FV Mid   Full                                                        +---------+---------------+---------+-----------+----------+--------------+ FV DistalFull                                                        +---------+---------------+---------+-----------+----------+--------------+ PFV      Full                                                        +---------+---------------+---------+-----------+----------+--------------+ POP      Full           Yes      Yes                                 +---------+---------------+---------+-----------+----------+--------------+ PTV      Full                                                        +---------+---------------+---------+-----------+----------+--------------+ PERO     Full                                                        +---------+---------------+---------+-----------+----------+--------------+    +----+---------------+---------+-----------+----------+--------------+ LEFTCompressibilityPhasicitySpontaneityPropertiesThrombus Aging +----+---------------+---------+-----------+----------+--------------+ CFV Full           Yes      Yes                                 +----+---------------+---------+-----------+----------+--------------+ SFJ Full                                                        +----+---------------+---------+-----------+----------+--------------+     Summary: RIGHT: - There is no evidence of deep vein thrombosis in the lower extremity.  - A cystic structure is found in the popliteal fossa. - Popliteal cyst measuring approximately 3.7 x 2.4 x 2.0 cm.  LEFT: - No evidence of common femoral vein obstruction.  *See table(s) above for measurements and observations.  Electronically signed by Monica Martinez MD on 07/15/2019 at 5:33:05 PM.    Final    DG Knee 3 Views Right  Result Date: 07/09/2019 Clinical:  Right knee pain, swelling X-rays were done of the right knee, three views. There is slight effusion of the right knee. Alignment is good.  No fracture or loose body noted. Bone quality is good. Impression:  Slight effusion of the right knee, no fracture noted. Electronically Signed Sanjuana Kava, MD 3/30/20219:54 AM      PFT Results Latest Ref Rng & Units 05/26/2015  FVC-Pre L 1.78  FVC-Predicted Pre % 57  FVC-Post L 1.88  FVC-Predicted Post % 60  Pre FEV1/FVC % % 85  Post FEV1/FCV % % 88  FEV1-Pre L 1.51  FEV1-Predicted Pre % 59  FEV1-Post L 1.67  DLCO UNC% % 36  DLCO COR %Predicted % 73  TLC L 2.78  TLC % Predicted % 53  RV % Predicted % 44    No results found for: NITRICOXIDE      Assessment & Plan:   Bilateral pulmonary embolism ([place]) Unprovoked PE March 2021.  Patient is continue on Xarelto .  Appreciate hematology referral.  Recommend lifelong anticoagulation.  Plan  Patient Instructions  Continue on Xarelto .  2 D echo in June .    Advance activity as tolerated.  Set up for CPAP At bedtime   Wear for at least 4-6 hrs or more each night .  Do not drive if sleepy .  Work on healthy weight loss .  Follow up in 2-3  months with Dr. Halford Chessman or Zaven Klemens NP  With PFT and As needed       Pulmonary hypertension ([place]) Elevated pulmonary artery pressure noted on 2D echo during hospitalization for PE.  Will need repeat 2D echo in June 2021.  Recent diagnosis of mild obstructive sleep apnea.  CPAP to be started  Sarcoidosis of lung ([place]) Currently stable continue current regimen.  PFTs pending     Rexene Edison, NP 08/02/2019

## 2019-08-02 NOTE — Assessment & Plan Note (Signed)
Unprovoked PE March 2021.  Patient is continue on Xarelto .  Appreciate hematology referral.  Recommend lifelong anticoagulation.  Plan  Patient Instructions  Continue on Xarelto .  2 D echo in June .  Advance activity as tolerated.  Set up for CPAP At bedtime   Wear for at least 4-6 hrs or more each night .  Do not drive if sleepy .  Work on healthy weight loss .  Follow up in 2-3  months with Dr. Craige Cotta or Johnjoseph Rolfe NP  With PFT and As needed

## 2019-08-02 NOTE — Assessment & Plan Note (Signed)
Currently stable continue current regimen.  PFTs pending

## 2019-08-02 NOTE — Assessment & Plan Note (Signed)
Elevated pulmonary artery pressure noted on 2D echo during hospitalization for PE.  Will need repeat 2D echo in June 2021.  Recent diagnosis of mild obstructive sleep apnea.  CPAP to be started

## 2019-08-04 NOTE — Progress Notes (Signed)
Reviewed and agree with assessment and plan.  Coralyn Helling, MD [facility] Pulmonary/Critical Care Pager - [phone] 08/04/2019, 3:22 PM

## 2019-08-08 ENCOUNTER — Encounter: Payer: Self-pay | Admitting: Orthopaedic Surgery

## 2019-08-08 ENCOUNTER — Ambulatory Visit (INDEPENDENT_AMBULATORY_CARE_PROVIDER_SITE_OTHER): Payer: 59 | Admitting: Orthopaedic Surgery

## 2019-08-08 ENCOUNTER — Other Ambulatory Visit: Payer: Self-pay

## 2019-08-08 VITALS — Ht 65.0 in | Wt 220.0 lb

## 2019-08-08 DIAGNOSIS — M25561 Pain in right knee: Secondary | ICD-10-CM

## 2019-08-08 DIAGNOSIS — G8929 Other chronic pain: Secondary | ICD-10-CM

## 2019-08-08 NOTE — Progress Notes (Signed)
Patient Hailey Ross, female DOB:[birth year], 49 y.o. FAO:[id number]  Chief Complaint  Patient presents with  . Knee Pain    Rt knee     HPI  Hailey Ross is a 49 y.o. female who has right knee pain. She had MRI which showed: IMPRESSION: 1. Tricompartmental cartilage abnormalities as described above. 2. Tiny radial tear of the free edge of the body of the medial meniscus. 3. Large joint effusion. 4. Small Baker's cyst with synovitis.  I have independently reviewed the MRI.  I have explained the findings to her.  I would not recommend any surgery now.  She is on a blood thinner. I cannot give NSAIDs.  I can see her as needed for possible injection.  I can give NSAID once she is off the blood thinner.   Body mass index is 36.61 kg/m.  ROS  Review of Systems  All other systems reviewed and are negative.  The following is a summary of the past history medically, past history surgically, known current medicines, social history and family history.  This information is gathered electronically by the computer from prior information and documentation.  I review this each visit and have found including this information at this point in the chart is beneficial and informative.    Past Medical History:  Diagnosis Date  . Acute cholecystitis 08/24/2016  . Acute kidney injury superimposed on chronic kidney disease ([place]) 06/20/2019  . Acute respiratory failure with hypoxia ([place]) 06/19/2019  . Annual visit for general adult medical examination with abnormal findings 04/16/2019  . Bronchiectasis ([place])   . CKD (chronic kidney disease)   . Lightheadedness 05/10/2019  . PE (pulmonary thromboembolism) ([place]) 06/2019  . Pre-syncope 04/16/2019  . Sarcoidosis   . Sarcoidosis   . Smoker 02/12/2014    Past Surgical History:  Procedure Laterality Date  . CHOLECYSTECTOMY N/A 08/25/2016   Procedure: LAPAROSCOPIC CHOLECYSTECTOMY;  Surgeon: Coralie Keens, MD;  Location: [facility];   Service: General;  Laterality: N/A;  . TUBAL LIGATION  02/1993    Family History  Problem Relation Age of Onset  . Hypertension Mother   . Cancer Mother     Social History Social History   Tobacco Use  . Smoking status: Former Smoker    Packs/day: 1.00    Years: 20.00    Pack years: 20.00    Types: Cigarettes    Quit date: 03/21/2015    Years since quitting: 4.3  . Smokeless tobacco: Never Used  Substance Use Topics  . Alcohol use: Not Currently    Alcohol/week: 0.0 standard drinks    Comment: Rare  . Drug use: No    No Known Allergies  Current Outpatient Medications  Medication Sig Dispense Refill  . acetaminophen (TYLENOL) 500 MG tablet Take 500 mg by mouth every 4 (four) hours as needed for mild pain, moderate pain or headache.     . albuterol (PROAIR HFA) 108 (90 Base) MCG/ACT inhaler Inhale 2 puffs into the lungs every 6 (six) hours as needed for wheezing or shortness of breath. 6.7 g 5  . albuterol (PROVENTIL) (2.5 MG/3ML) 0.083% nebulizer solution Take 3 mLs (2.5 mg total) by nebulization every 6 (six) hours as needed for wheezing or shortness of breath. 75 mL 1  . rivaroxaban (XARELTO) 20 MG TABS tablet Take 1 tablet (20 mg total) by mouth daily with supper. 30 tablet 5  . rosuvastatin (CRESTOR) 5 MG tablet Take 1 tablet (5 mg total) by mouth daily. 30 tablet 1  .  Vitamin D, Ergocalciferol, (DRISDOL) 1.25 MG (50000 UNIT) CAPS capsule Take 1 capsule (50,000 Units total) by mouth every 7 (seven) days. 12 capsule 1   No current facility-administered medications for this visit.     Physical Exam  Height 5\' 5"  (1.651 m), weight 220 lb (99.8 kg).  Constitutional: overall normal hygiene, normal nutrition, well developed, normal grooming, normal body habitus. Assistive device:none  Musculoskeletal: gait and station Limp right, muscle tone and strength are normal, no tremors or atrophy is present.  .  Neurological: coordination overall normal.  Deep tendon  reflex/nerve stretch intact.  Sensation normal.  Cranial nerves II-XII intact.   Skin:   Normal overall no scars, lesions, ulcers or rashes. No psoriasis.  Psychiatric: Alert and oriented x 3.  Recent memory intact, remote memory unclear.  Normal mood and affect. Well groomed.  Good eye contact.  Cardiovascular: overall no swelling, no varicosities, no edema bilaterally, normal temperatures of the legs and arms, no clubbing, cyanosis and good capillary refill.  Lymphatic: palpation is normal.  Right knee pain, effusion, crepitus, ROM 0 to 110, stable, NV intact.  All other systems reviewed and are negative   The patient has been educated about the nature of the problem(s) and counseled on treatment options.  The patient appeared to understand what I have discussed and is in agreement with it.  Encounter Diagnosis  Name Primary?  . Chronic pain of right knee Yes    PLAN Call if any problems.  Precautions discussed.  Continue current medications.   Return to clinic prn   Electronically Signed , MD 4/29/202110:11 AM

## 2019-08-13 ENCOUNTER — Encounter: Payer: Self-pay | Admitting: Family Medicine

## 2019-08-13 ENCOUNTER — Other Ambulatory Visit: Payer: Self-pay

## 2019-08-13 DIAGNOSIS — E785 Hyperlipidemia, unspecified: Secondary | ICD-10-CM

## 2019-08-13 MED ORDER — ROSUVASTATIN CALCIUM 5 MG PO TABS: 5.0000 mg | ORAL_TABLET | Freq: Every day | ORAL | 3 refills | Status: DC

## 2019-08-17 ENCOUNTER — Encounter (HOSPITAL_COMMUNITY): Payer: Self-pay | Admitting: Emergency Medicine

## 2019-08-17 ENCOUNTER — Other Ambulatory Visit: Payer: Self-pay

## 2019-08-17 ENCOUNTER — Emergency Department (HOSPITAL_COMMUNITY)
Admission: EM | Admit: 2019-08-17 | Discharge: 2019-08-17 | Disposition: A | Discharge status: Home / Self Care | Payer: 59 | Attending: Emergency Medicine | Admitting: Emergency Medicine

## 2019-08-17 DIAGNOSIS — R42 Dizziness and giddiness: Secondary | ICD-10-CM | POA: Diagnosis not present

## 2019-08-17 DIAGNOSIS — Z79899 Other long term (current) drug therapy: Secondary | ICD-10-CM | POA: Insufficient documentation

## 2019-08-17 DIAGNOSIS — R002 Palpitations: Secondary | ICD-10-CM | POA: Diagnosis not present

## 2019-08-17 DIAGNOSIS — I129 Hypertensive chronic kidney disease with stage 1 through stage 4 chronic kidney disease, or unspecified chronic kidney disease: Secondary | ICD-10-CM | POA: Insufficient documentation

## 2019-08-17 DIAGNOSIS — N189 Chronic kidney disease, unspecified: Secondary | ICD-10-CM | POA: Diagnosis not present

## 2019-08-17 DIAGNOSIS — Z87891 Personal history of nicotine dependence: Secondary | ICD-10-CM | POA: Insufficient documentation

## 2019-08-17 LAB — CBC
HCT: 40.8 % (ref 36.0–46.0)
Hemoglobin: 12.8 g/dL (ref 12.0–15.0)
MCH: 27.3 pg (ref 26.0–34.0)
MCHC: 31.4 g/dL (ref 30.0–36.0)
MCV: 87 fL (ref 80.0–100.0)
Platelets: 444 10*3/uL — ABNORMAL HIGH (ref 150–400)
RBC: 4.69 MIL/uL (ref 3.87–5.11)
RDW: 13.2 % (ref 11.5–15.5)
WBC: 12 10*3/uL — ABNORMAL HIGH (ref 4.0–10.5)
nRBC: 0 % (ref 0.0–0.2)

## 2019-08-17 LAB — URINALYSIS, ROUTINE W REFLEX MICROSCOPIC
Bilirubin Urine: NEGATIVE
Glucose, UA: NEGATIVE mg/dL
Hgb urine dipstick: NEGATIVE
Ketones, ur: NEGATIVE mg/dL
Leukocytes,Ua: NEGATIVE
Nitrite: NEGATIVE
Protein, ur: NEGATIVE mg/dL
Specific Gravity, Urine: 1.018 (ref 1.005–1.030)
pH: 6 (ref 5.0–8.0)

## 2019-08-17 LAB — BASIC METABOLIC PANEL
Anion gap: 9 (ref 5–15)
BUN: 10 mg/dL (ref 6–20)
CO2: 22 mmol/L (ref 22–32)
Calcium: 9.1 mg/dL (ref 8.9–10.3)
Chloride: 105 mmol/L (ref 98–111)
Creatinine, Ser: 1.16 mg/dL — ABNORMAL HIGH (ref 0.44–1.00)
GFR calc Af Amer: >60 mL/min (ref >60)
GFR calc non Af Amer: 55 mL/min — ABNORMAL LOW (ref >60)
Glucose, Bld: 111 mg/dL — ABNORMAL HIGH (ref 70–99)
Potassium: 3.8 mmol/L (ref 3.5–5.1)
Sodium: 136 mmol/L (ref 135–145)

## 2019-08-17 LAB — I-STAT BETA HCG BLOOD, ED (MC, WL, AP ONLY): I-stat hCG, quantitative: <5 m[IU]/mL (ref <5)

## 2019-08-17 LAB — CBG MONITORING, ED: Glucose-Capillary: 97 mg/dL (ref 70–99)

## 2019-08-17 LAB — TROPONIN I (HIGH SENSITIVITY)
Troponin I (High Sensitivity): 18 ng/L — ABNORMAL HIGH (ref <18)
Troponin I (High Sensitivity): 17 ng/L (ref <18)

## 2019-08-17 LAB — D-DIMER, QUANTITATIVE: D-Dimer, Quant: 0.49 ug/mL-FEU (ref 0.00–0.50)

## 2019-08-17 MED ORDER — SODIUM CHLORIDE 0.9% FLUSH: 3.0000 mL | Freq: Once | INTRAVENOUS | Status: DC

## 2019-08-17 NOTE — Discharge Instructions (Addendum)
Please make sure to follow up with the referral made by your PCP for your cardiology appointment. Your workup today was overall reassuring. Please return to the ER if your symptoms worsen.

## 2019-08-17 NOTE — ED Provider Notes (Signed)
[facility] EMERGENCY DEPARTMENT Provider Note   CSN: [id number] Arrival date & time: 08/17/19  7829     History Chief Complaint  Patient presents with  . Tachycardia  . Dizziness    ANITRIA Ross is a 49 y.o. female.  HPI 48 year old female with history of sarcoidosis, bronchiectasis, CKD stage II, bilaterally PEs, pulmonary hypertension, HTN presents to the ER for lightheadedness and an episode of palpitations yesterday while driving.  Patient refers that she had bilateral PEs in March and has been having intermittent lightheadedness since then.  She has been placed on Xarelto.  Patient refers that she had an episode yesterday while driving where she felt like her heart was racing, she checked her heart rate which was 140, this lasted a few minutes and her heart rate then returned back to normal.  She has had a history of episodes like this, states that this is not feel like when she had her PEs.  She denies any shortness of breath, chest pain, syncope, back pain.  No radiating chest pain, tightness, pressure.  No fevers or chills, cough, no rectal bleeding, no hematuria.  She refers that she has seen her PCP and mentioned the symptoms to her, and her PCP has referred her to a cardiologist but she has not scheduled an appointment.  Denies alcohol or drug use.    Past Medical History:  Diagnosis Date  . Acute cholecystitis 08/24/2016  . Acute kidney injury superimposed on chronic kidney disease (HCC) 06/20/2019  . Acute respiratory failure with hypoxia (HCC) 06/19/2019  . Annual visit for general adult medical examination with abnormal findings 04/16/2019  . Bronchiectasis (HCC)   . CKD (chronic kidney disease)   . Lightheadedness 05/10/2019  . PE (pulmonary thromboembolism) (HCC) 06/2019  . Pre-syncope 04/16/2019  . Sarcoidosis   . Sarcoidosis   . Smoker 02/12/2014    Patient Active Problem List   Diagnosis Date Noted  . Essential hypertension 07/04/2019  . Pain  and swelling of right knee 07/04/2019  . Hyperlipidemia 07/04/2019  . Stage 2 chronic kidney disease 07/04/2019  . 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 Encounter for support and coordination of transition of care 07/04/2019  . Daytime hypersomnolence 07/02/2019  . Pulmonary hypertension (HCC) 07/02/2019  . CKD (chronic kidney disease)   . Bilateral pulmonary embolism (HCC) 06/18/2019  . Vitamin D deficiency 05/10/2019  . Class 2 obesity due to excess calories with body mass index (BMI) of 37.0 to 37.9 in adult 04/16/2019  . Sarcoidosis 05/04/2015  . Bronchiectasis (HCC) 05/04/2015    Past Surgical History:  Procedure Laterality Date  . CHOLECYSTECTOMY N/A 08/25/2016   Procedure: LAPAROSCOPIC CHOLECYSTECTOMY;  Surgeon: Abigail Miyamoto, MD;  Location: [facility];  Service: General;  Laterality: N/A;  . TUBAL LIGATION  02/1993     OB History   No obstetric history on file.     Family History  Problem Relation Age of Onset  . Hypertension Mother   . Cancer Mother     Social History   Tobacco Use  . Smoking status: Former Smoker    Packs/day: 1.00    Years: 20.00    Pack years: 20.00    Types: Cigarettes    Quit date: 03/21/2015    Years since quitting: 4.4  . Smokeless tobacco: Never Used  Substance Use Topics  . Alcohol use: Not Currently    Alcohol/week: 0.0 standard drinks    Comment: Rare  . Drug use: No  Home Medications Prior to Admission medications   Medication Sig Start Date End Date Taking? Authorizing Provider  acetaminophen (TYLENOL) 500 MG tablet Take 500 mg by mouth every 4 (four) hours as needed for mild pain, moderate pain or headache.    Yes [provider]  albuterol (PROAIR HFA) 108 (90 Base) MCG/ACT inhaler Inhale 2 puffs into the lungs every 6 (six) hours as needed for wheezing or shortness of breath. 04/23/19  Yes Coralyn Helling, MD  albuterol (PROVENTIL) (2.5 MG/3ML) 0.083% nebulizer solution Take 3 mLs (2.5 mg total) by nebulization  every 6 (six) hours as needed for wheezing or shortness of breath. 07/22/19  Yes Parrett, Virgel Bouquet, NP  rivaroxaban (XARELTO) 20 MG TABS tablet Take 1 tablet (20 mg total) by mouth daily with supper. 07/08/19  Yes Parrett, Tammy S, NP  rosuvastatin (CRESTOR) 5 MG tablet Take 1 tablet (5 mg total) by mouth daily. 08/13/19  Yes Freddy Finner, NP  Vitamin D, Ergocalciferol, (DRISDOL) 1.25 MG (50000 UNIT) CAPS capsule Take 1 capsule (50,000 Units total) by mouth every 7 (seven) days. 07/08/19  Yes Freddy Finner, NP    Allergies    Patient has no known allergies.  Review of Systems   Review of Systems  Constitutional: Negative for chills, fatigue and fever.  HENT: Negative for ear pain and sore throat.   Eyes: Negative for pain and visual disturbance.  Respiratory: Negative for cough, chest tightness and shortness of breath.   Cardiovascular: Negative for chest pain, palpitations and leg swelling.  Gastrointestinal: Negative for abdominal pain, blood in stool, diarrhea, nausea and vomiting.  Genitourinary: Negative for dysuria, hematuria and vaginal bleeding.  Musculoskeletal: Negative for arthralgias and back pain.  Skin: Negative for color change and rash.  Neurological: Positive for light-headedness. Negative for dizziness, seizures, syncope, facial asymmetry, weakness and headaches.  Hematological: Does not bruise/bleed easily.  Psychiatric/Behavioral: Negative for confusion.  All other systems reviewed and are negative.   Physical Exam Updated Vital Signs BP 121/84   Pulse 77   Temp 99.5 F (37.5 C) (Oral)   Resp 20   SpO2 96%   Physical Exam Vitals and nursing note reviewed.  Constitutional:      General: She is not in acute distress.    Appearance: She is well-developed.  HENT:     Head: Normocephalic and atraumatic.  Eyes:     Conjunctiva/sclera: Conjunctivae normal.  Cardiovascular:     Rate and Rhythm: Normal rate and regular rhythm.     Pulses: Normal pulses.           Radial pulses are 2+ on the right side and 2+ on the left side.       Dorsalis pedis pulses are 2+ on the right side and 2+ on the left side.     Heart sounds: Normal heart sounds. No murmur.  Pulmonary:     Effort: Pulmonary effort is normal. No respiratory distress.     Breath sounds: Normal breath sounds.  Abdominal:     Palpations: Abdomen is soft.     Tenderness: There is no abdominal tenderness.  Musculoskeletal:     Cervical back: Neck supple.     Right lower leg: No edema.     Left lower leg: No edema.  Skin:    General: Skin is warm and dry.  Neurological:     General: No focal deficit present.     Mental Status: She is alert and oriented to person, place, and time.  GCS: GCS eye subscore is 4. GCS verbal subscore is 5. GCS motor subscore is 6.     Cranial Nerves: Cranial nerves are intact.     Sensory: Sensation is intact.     Motor: No weakness.     Coordination: Coordination is intact.     Gait: Gait is intact.     Deep Tendon Reflexes: Reflexes are normal and symmetric.     Comments: Mental Status:  Alert, thought content appropriate, able to give a coherent history. Speech fluent without evidence of aphasia. Able to follow 2 step commands without difficulty.  Cranial Nerves:  II: Peripheral visual fields grossly normal, pupils equal, round, reactive to light III,IV, VI: ptosis not present, extra-ocular motions intact bilaterally  V,VII: smile symmetric, facial light touch sensation equal VIII: hearing grossly normal to voice  X: uvula elevates symmetrically  XI: bilateral shoulder shrug symmetric and strong XII: midline tongue extension without fassiculations Motor:  Normal tone. 5/5 strength of BUE and BLE major muscle groups including strong and equal grip strength and dorsiflexion/plantar flexion Sensory: light touch normal in all extremities. Cerebellar: normal finger-to-nose with bilateral upper extremities, Romberg sign absent Gait: normal gait and  balance. Able to walk on toes and heels with ease.      ED Results / Procedures / Treatments   Labs (all labs ordered are listed, but only abnormal results are displayed) Labs Reviewed  BASIC METABOLIC PANEL - Abnormal; Notable for the following components:      Result Value   Glucose, Bld 111 (*)    Creatinine, Ser 1.16 (*)    GFR calc non Af Amer 55 (*)    All other components within normal limits  CBC - Abnormal; Notable for the following components:   WBC 12.0 (*)    Platelets 444 (*)    All other components within normal limits  TROPONIN I (HIGH SENSITIVITY) - Abnormal; Notable for the following components:   Troponin I (High Sensitivity) 18 (*)    All other components within normal limits  URINALYSIS, ROUTINE W REFLEX MICROSCOPIC  D-DIMER, QUANTITATIVE (NOT AT [facility])  CBG MONITORING, ED  I-STAT BETA HCG BLOOD, ED ([facility], [facility], [facility] ONLY)  TROPONIN I (HIGH SENSITIVITY)    EKG EKG Interpretation  Date/Time:  Saturday Aug 17 2019 08:34:31 EDT Ventricular Rate:  70 PR Interval:  146 QRS Duration: 84 QT Interval:  386 QTC Calculation: 416 R Axis:   28 Text Interpretation: Normal sinus rhythm Normal ECG No old tracing to compare Confirmed by Linwood Dibbles [phone]) on 08/17/2019 9:10:19 AM   Radiology No results found.  Procedures Procedures (including critical care time)  Medications Ordered in ED Medications  sodium chloride flush (NS) 0.9 % injection 3 mL (has no administration in time range)    ED Course  I have reviewed the triage vital signs and the nursing notes.  Pertinent labs & imaging results that were available during my care of the patient were reviewed by me and considered in my medical decision making (see chart for details).    MDM Rules/Calculators/A&P                     49 year old with intermittent lightheadedness and palpitations. On presentation to the ER, patient is alert and oriented, in no acute distress, speaking in full sentences without  increased work of breathing, nondiaphoretic.  Vitals overall reassuring.  Physical exam without abnormalities.  She is overall well-appearing.  Delta troponin negative, D-dimer negative BMP with creatinine  of 1.16, which seems to be improved from previous visits.  Slight white count of 12, normal hemoglobin.  UA without evidence of UTI.  Overall work-up reassuring.   Patient is to be discharged with recommendation to follow up with PCP and pending cardiology referral as patient could benefit from further cardiac work-up.  Chest pain is not likely acute cardiopulmonary etiology d/t presentation, PERC negative, VSS, no tracheal deviation, no JVD or new murmur, RRR, breath sounds equal bilaterally, EKG without acute abnormalities, negative troponin. Pt has been advised to return to the ED if CP becomes exertional, associated with diaphoresis or nausea, radiates to left jaw/arm, worsens or becomes concerning in any way. Pt appears reliable for follow up and is agreeable to discharge.   Case has been discussed with and seen by Dr. Tomi Bamberger who agrees with the above plan to discharge.   Final Clinical Impression(s) / ED Diagnoses Final diagnoses:  Lightheadedness  Palpitations    Rx / DC Orders ED Discharge Orders    None       Lyndel Safe 08/17/19 1319    Dorie Rank, MD 08/17/19 [phone]

## 2019-08-17 NOTE — ED Triage Notes (Addendum)
Pt reports she has had some lightheadedness that has been ongoing since January, diagnosed with PE in march, states that lightheadedness comes and goes. Yesterday while driving, she began experiencing lightheadedness, and feeling like her heart was racing, checked her pulse which was 140, states HR decreased after just a few minutes. Reports that now she just feels like her legs are heavy. A/ox4, resp e/u, nad speech clear, face symmetrical, no neuro deficits. Denies sob or cough.

## 2019-08-19 ENCOUNTER — Encounter: Payer: Self-pay | Admitting: Family Medicine

## 2019-08-21 ENCOUNTER — Other Ambulatory Visit: Payer: Self-pay | Admitting: Family Medicine

## 2019-08-21 ENCOUNTER — Other Ambulatory Visit: Payer: Self-pay

## 2019-08-21 ENCOUNTER — Encounter (HOSPITAL_COMMUNITY): Payer: Self-pay | Admitting: Emergency Medicine

## 2019-08-21 ENCOUNTER — Emergency Department (HOSPITAL_COMMUNITY)
Admission: EM | Admit: 2019-08-21 | Discharge: 2019-08-21 | Disposition: A | Discharge status: Home / Self Care | Payer: 59 | Attending: Emergency Medicine | Admitting: Emergency Medicine

## 2019-08-21 DIAGNOSIS — N939 Abnormal uterine and vaginal bleeding, unspecified: Secondary | ICD-10-CM | POA: Diagnosis not present

## 2019-08-21 DIAGNOSIS — Z79899 Other long term (current) drug therapy: Secondary | ICD-10-CM | POA: Insufficient documentation

## 2019-08-21 DIAGNOSIS — I129 Hypertensive chronic kidney disease with stage 1 through stage 4 chronic kidney disease, or unspecified chronic kidney disease: Secondary | ICD-10-CM | POA: Diagnosis not present

## 2019-08-21 DIAGNOSIS — N182 Chronic kidney disease, stage 2 (mild): Secondary | ICD-10-CM | POA: Insufficient documentation

## 2019-08-21 DIAGNOSIS — Z87891 Personal history of nicotine dependence: Secondary | ICD-10-CM | POA: Insufficient documentation

## 2019-08-21 LAB — COMPREHENSIVE METABOLIC PANEL
ALT: 18 U/L (ref 0–44)
AST: 24 U/L (ref 15–41)
Albumin: 3.9 g/dL (ref 3.5–5.0)
Alkaline Phosphatase: 80 U/L (ref 38–126)
Anion gap: 8 (ref 5–15)
BUN: 12 mg/dL (ref 6–20)
CO2: 24 mmol/L (ref 22–32)
Calcium: 9.1 mg/dL (ref 8.9–10.3)
Chloride: 106 mmol/L (ref 98–111)
Creatinine, Ser: 1.19 mg/dL — ABNORMAL HIGH (ref 0.44–1.00)
GFR calc Af Amer: >60 mL/min (ref >60)
GFR calc non Af Amer: 54 mL/min — ABNORMAL LOW (ref >60)
Glucose, Bld: 93 mg/dL (ref 70–99)
Potassium: 3.9 mmol/L (ref 3.5–5.1)
Sodium: 138 mmol/L (ref 135–145)
Total Bilirubin: 0.7 mg/dL (ref 0.3–1.2)
Total Protein: 7.7 g/dL (ref 6.5–8.1)

## 2019-08-21 LAB — URINALYSIS, ROUTINE W REFLEX MICROSCOPIC
Bilirubin Urine: NEGATIVE
Glucose, UA: NEGATIVE mg/dL
Ketones, ur: NEGATIVE mg/dL
Leukocytes,Ua: NEGATIVE
Nitrite: NEGATIVE
Protein, ur: NEGATIVE mg/dL
Specific Gravity, Urine: 1.024 (ref 1.005–1.030)
pH: 5 (ref 5.0–8.0)

## 2019-08-21 LAB — WET PREP, GENITAL
Clue Cells Wet Prep HPF POC: NONE SEEN
Sperm: NONE SEEN
Trich, Wet Prep: NONE SEEN
Yeast Wet Prep HPF POC: NONE SEEN

## 2019-08-21 LAB — CBC
HCT: 40.6 % (ref 36.0–46.0)
Hemoglobin: 12.4 g/dL (ref 12.0–15.0)
MCH: 26.8 pg (ref 26.0–34.0)
MCHC: 30.5 g/dL (ref 30.0–36.0)
MCV: 87.9 fL (ref 80.0–100.0)
Platelets: 423 10*3/uL — ABNORMAL HIGH (ref 150–400)
RBC: 4.62 MIL/uL (ref 3.87–5.11)
RDW: 13.2 % (ref 11.5–15.5)
WBC: 12.2 10*3/uL — ABNORMAL HIGH (ref 4.0–10.5)
nRBC: 0 % (ref 0.0–0.2)

## 2019-08-21 LAB — LIPASE, BLOOD: Lipase: 32 U/L (ref 11–51)

## 2019-08-21 LAB — PREGNANCY, URINE: Preg Test, Ur: NEGATIVE

## 2019-08-21 NOTE — ED Provider Notes (Signed)
[facility] EMERGENCY DEPARTMENT Provider Note   CSN: [id number] Arrival date & time: 08/21/19  1336     History Chief Complaint  Patient presents with  . Vaginal Bleeding    Hailey Ross is a 49 y.o. female the past medical history of sarcoidosis, chronic exertional dyspnea, recent PE diagnosis on Xarelto who presents emergency department for vaginal spotting.  Patient states that she is not due to get her period for 1 more week.  She was concerned because she noticed some spotting today and she is on Xarelto.  She denies lightheadedness, vaginal symptoms other than spotting, suprapubic pain or tenderness.  She is sexually active with a single female partner who she has been together with for 20 years.  HPI     Past Medical History:  Diagnosis Date  . Acute cholecystitis 08/24/2016  . Acute kidney injury superimposed on chronic kidney disease ([place]) 06/20/2019  . Acute respiratory failure with hypoxia ([place]) 06/19/2019  . Annual visit for general adult medical examination with abnormal findings 04/16/2019  . Bronchiectasis ([place])   . CKD (chronic kidney disease)   . Lightheadedness 05/10/2019  . PE (pulmonary thromboembolism) ([place]) 06/2019  . Pre-syncope 04/16/2019  . Sarcoidosis   . Sarcoidosis   . Smoker 02/12/2014    Patient Active Problem List   Diagnosis Date Noted  . Essential hypertension 07/04/2019  . Pain and swelling of right knee 07/04/2019  . Hyperlipidemia 07/04/2019  . Stage 2 chronic kidney disease 07/04/2019  . Sarcoidosis of lung ([place]) 07/04/2019  . Encounter for support and coordination of transition of care 07/04/2019  . Daytime hypersomnolence 07/02/2019  . Pulmonary hypertension ([place]) 07/02/2019  . CKD (chronic kidney disease)   . Bilateral pulmonary embolism ([place]) 06/18/2019  . Vitamin D deficiency 05/10/2019  . Class 2 obesity due to excess calories with body mass index (BMI) of 37.0 to 37.9 in adult 04/16/2019  . Sarcoidosis 05/04/2015  .  Bronchiectasis ([place]) 05/04/2015    Past Surgical History:  Procedure Laterality Date  . CHOLECYSTECTOMY N/A 08/25/2016   Procedure: LAPAROSCOPIC CHOLECYSTECTOMY;  Surgeon: Coralie Keens, MD;  Location: [place];  Service: General;  Laterality: N/A;  . TUBAL LIGATION  02/1993     OB History   No obstetric history on file.     Family History  Problem Relation Age of Onset  . Hypertension Mother   . Cancer Mother     Social History   Tobacco Use  . Smoking status: Former Smoker    Packs/day: 1.00    Years: 20.00    Pack years: 20.00    Types: Cigarettes    Quit date: 03/21/2015    Years since quitting: 4.4  . Smokeless tobacco: Never Used  Substance Use Topics  . Alcohol use: Not Currently    Alcohol/week: 0.0 standard drinks    Comment: Rare  . Drug use: No    Home Medications Prior to Admission medications   Medication Sig Start Date End Date Taking? Authorizing Provider  acetaminophen (TYLENOL) 500 MG tablet Take 500 mg by mouth every 4 (four) hours as needed for mild pain, moderate pain or headache.     [provider]  albuterol (PROAIR HFA) 108 (90 Base) MCG/ACT inhaler Inhale 2 puffs into the lungs every 6 (six) hours as needed for wheezing or shortness of breath. 04/23/19   Chesley Mires, MD  albuterol (PROVENTIL) (2.5 MG/3ML) 0.083% nebulizer solution Take 3 mLs (2.5 mg total) by nebulization every 6 (six) hours as needed  for wheezing or shortness of breath. 07/22/19   Parrett, Virgel Bouquet, NP  rivaroxaban (XARELTO) 20 MG TABS tablet Take 1 tablet (20 mg total) by mouth daily with supper. 07/08/19   Parrett, Virgel Bouquet, NP  rosuvastatin (CRESTOR) 5 MG tablet Take 1 tablet (5 mg total) by mouth daily. 08/13/19   Freddy Finner, NP  Vitamin D, Ergocalciferol, (DRISDOL) 1.25 MG (50000 UNIT) CAPS capsule Take 1 capsule (50,000 Units total) by mouth every 7 (seven) days. 07/08/19   Freddy Finner, NP    Allergies    Patient has no known allergies.  Review of Systems    Review of Systems Ten systems reviewed and are negative for acute change, except as noted in the HPI.   Physical Exam Updated Vital Signs BP (!) 122/91 (BP Location: Right Arm)   Pulse 65   Temp 98.8 F (37.1 C) (Oral)   Resp 18   Ht 5\' 5"  (1.651 m)   Wt 97.1 kg   LMP 08/03/2019   SpO2 96%   BMI 35.61 kg/m   Physical Exam Vitals and nursing note reviewed.  Constitutional:      General: She is not in acute distress.    Appearance: She is well-developed. She is not diaphoretic.  HENT:     Head: Normocephalic and atraumatic.  Eyes:     General: No scleral icterus.    Conjunctiva/sclera: Conjunctivae normal.  Cardiovascular:     Rate and Rhythm: Normal rate and regular rhythm.     Heart sounds: Normal heart sounds. No murmur. No friction rub. No gallop.   Pulmonary:     Effort: Pulmonary effort is normal. No respiratory distress.     Breath sounds: Normal breath sounds.  Abdominal:     General: Bowel sounds are normal. There is no distension.     Palpations: Abdomen is soft. There is no mass.     Tenderness: There is no abdominal tenderness. There is no guarding.  Genitourinary:    Comments: Pelvic exam: normal external genitalia, vulva, vagina, cervix, uterus and adnexa. Bloody discharge from cervical os consistent with menstruation   Musculoskeletal:     Cervical back: Normal range of motion.  Skin:    General: Skin is warm and dry.  Neurological:     Mental Status: She is alert and oriented to person, place, and time.  Psychiatric:        Behavior: Behavior normal.     ED Results / Procedures / Treatments   Labs (all labs ordered are listed, but only abnormal results are displayed) Labs Reviewed  COMPREHENSIVE METABOLIC PANEL - Abnormal; Notable for the following components:      Result Value   Creatinine, Ser 1.19 (*)    GFR calc non Af Amer 54 (*)    All other components within normal limits  CBC - Abnormal; Notable for the following components:   WBC  12.2 (*)    Platelets 423 (*)    All other components within normal limits  WET PREP, GENITAL  LIPASE, BLOOD  URINALYSIS, ROUTINE W REFLEX MICROSCOPIC  PREGNANCY, URINE  GC/CHLAMYDIA PROBE AMP ([place]) NOT AT [facility]    EKG None  Radiology No results found.  Procedures Procedures (including critical care time)  Medications Ordered in ED Medications - No data to display  ED Course  I have reviewed the triage vital signs and the nursing notes.  Pertinent labs & imaging results that were available during my care of the patient were reviewed  by me and considered in my medical decision making (see chart for details).    MDM Rules/Calculators/A&P                      Patient here with bleeding.  She is 1 week early for her period.  Personally ordered interpreted reviewed patient's labs which show a lipase within normal limits, slightly elevated white blood cell count.  Of insignificant value.  UA shows contamination from menstrual cycle without evidence of infection.  Wet prep with moderate white blood cells.  CMP with baseline elevated creatinine.  Pelvic exam is benign.  Likely represents abnormal uterine bleeding versus early menstrual cycle.  No evidence of infection.  Follow closely with OB/GYN.  Appears appropriate for discharge at this time.  Hemodynamically stable. Final Clinical Impression(s) / ED Diagnoses Final diagnoses:  None    Rx / DC Orders ED Discharge Orders    None       Arthor Captain, PA-C 08/21/19 2141    Bethann Berkshire, MD 08/21/19 2310

## 2019-08-21 NOTE — Discharge Instructions (Addendum)
-  Continue taking your Xarelto as directed Contact a health care provider if: Your bleeding lasts for more than one week. You feel dizzy at times. You feel nauseous or you vomit. Get help right away if: You pass out. Your bleeding soaks through a pad every hour. You have abdominal pain. You have a fever. You become sweaty or weak. You pass large blood clots from your vagina.

## 2019-08-21 NOTE — ED Triage Notes (Signed)
Pt started her menstrual cycle this morning, with light bleeding.  She had her last cycle 04/24.  Also reports a tight ing feeling across her abdomen.

## 2019-08-22 LAB — GC/CHLAMYDIA PROBE AMP (~~LOC~~) NOT AT ARMC
Chlamydia: NEGATIVE
Comment: NEGATIVE
Comment: NORMAL
Neisseria Gonorrhea: NEGATIVE

## 2019-08-23 ENCOUNTER — Encounter: Payer: Self-pay | Admitting: Cardiology

## 2019-08-23 ENCOUNTER — Ambulatory Visit (INDEPENDENT_AMBULATORY_CARE_PROVIDER_SITE_OTHER): Payer: 59 | Admitting: Cardiology

## 2019-08-23 ENCOUNTER — Other Ambulatory Visit: Payer: Self-pay

## 2019-08-23 ENCOUNTER — Telehealth: Payer: Self-pay | Admitting: Cardiology

## 2019-08-23 VITALS — BP 114/82 | HR 65 | Ht 65.0 in | Wt 215.8 lb

## 2019-08-23 DIAGNOSIS — I272 Pulmonary hypertension, unspecified: Secondary | ICD-10-CM | POA: Diagnosis not present

## 2019-08-23 DIAGNOSIS — R002 Palpitations: Secondary | ICD-10-CM

## 2019-08-23 NOTE — Telephone Encounter (Signed)
precert for 30 day monitor

## 2019-08-23 NOTE — Patient Instructions (Signed)
Your physician recommends that you schedule a follow-up appointment in: 2 MONTHS WITH DR BRANCH  Your physician recommends that you continue on your current medications as directed. Please refer to the Current Medication list given to you today.  Your physician has recommended that you wear an event monitor FOR 30 DAYS. Event monitors are medical devices that record the heart's electrical activity. Doctors most often us these monitors to diagnose arrhythmias. Arrhythmias are problems with the speed or rhythm of the heartbeat. The monitor is a small, portable device. You can wear one while you do your normal daily activities. This is usually used to diagnose what is causing palpitations/syncope (passing out).  Thank you for choosing [facility]!!     

## 2019-08-23 NOTE — Progress Notes (Signed)
Clinical Summary [name] is a 49 y.o.female seen today as a new patient for the following medical problems.     1. Palpitations - ER visit 08/17/19 with tachycardia and dizziness. Reported episode of heart racing to 140s while driving. Prior feeling on uneasiness, followed by sudden palpitations. Checked HR on watch and was 144. Episodes lasted about 5 minutes - K 3.8 Cr 1.16 WBC 12 Hgb 12.8 Plt 444 hstrop 18-->17  Ddimer 0.49   - 2 total episodes of palpitations. - first episode in 07/2019 while sleeping, awoke her from sleep.  - feeling of heart racing. Walked around for a few minutes, did not resolve.  - came to ER 07/15/19, negative workup including CT PE - episode lasted about 15 minutes in total  - notes on her watch HRs up and down in general - can have minor palpitations at times.   - previously was drinking coffee x 1 day, sodas pepsi cans x 2, sweet tea glasses x 2-3, no energy drinks, no EtOH     2. 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 CKD 2 - followed by Dr Hailey Ross  4. History of PE unprovoked - bilateral PEs in 06/2019, has been on xarelto - repeat CT PE 07/2019 PEs had resolved.  - 06/2019 echo LVEF 60-65^, mild RV dysfunction, PASP 52 at time of PE    5. Pulmonary HTN - noted during echo during PE - pulmonary plans to repeat echo next month   6. OSA on cpap    Past Medical History:  Diagnosis Date  . Acute cholecystitis 08/24/2016  . Acute kidney injury superimposed on chronic kidney disease (HCC) 06/20/2019  . Acute respiratory failure with hypoxia (HCC) 06/19/2019  . Annual visit for general adult medical examination with abnormal findings 04/16/2019  . Bronchiectasis (HCC)   . CKD (chronic kidney disease)   . Lightheadedness 05/10/2019  . PE (pulmonary thromboembolism) (HCC) 06/2019  . Pre-syncope 04/16/2019  . Sarcoidosis   . Sarcoidosis   . Smoker 02/12/2014     No Known Allergies   Current Outpatient Medications    Medication Sig Dispense Refill  . acetaminophen (TYLENOL) 500 MG tablet Take 500 mg by mouth every 4 (four) hours as needed for mild pain, moderate pain or headache.     . albuterol (PROAIR HFA) 108 (90 Base) MCG/ACT inhaler Inhale 2 puffs into the lungs every 6 (six) hours as needed for wheezing or shortness of breath. 6.7 g 5  . albuterol (PROVENTIL) (2.5 MG/3ML) 0.083% nebulizer solution Take 3 mLs (2.5 mg total) by nebulization every 6 (six) hours as needed for wheezing or shortness of breath. 75 mL 1  . lisinopril (ZESTRIL) 2.5 MG tablet Take 2.5 mg by mouth daily.    . rivaroxaban (XARELTO) 20 MG TABS tablet Take 1 tablet (20 mg total) by mouth daily with supper. 30 tablet 5  . rosuvastatin (CRESTOR) 5 MG tablet Take 1 tablet (5 mg total) by mouth daily. 30 tablet 3  . Vitamin D, Ergocalciferol, (DRISDOL) 1.25 MG (50000 UNIT) CAPS capsule Take 1 capsule (50,000 Units total) by mouth every 7 (seven) days. 12 capsule 1   No current facility-administered medications for this visit.     Past Surgical History:  Procedure Laterality Date  . CHOLECYSTECTOMY N/A 08/25/2016   Procedure: LAPAROSCOPIC CHOLECYSTECTOMY;  Surgeon: Abigail Miyamoto, MD;  Location: [facility] OR;  Service: General;  Laterality: N/A;  . TUBAL LIGATION  02/1993     No Known  Allergies    Family History  Problem Relation Age of Onset  . Hypertension Mother   . Cancer Mother      Social History [name] reports that she quit smoking about 4 years ago. Her smoking use included cigarettes. She has a 20.00 pack-year smoking history. She has never used smokeless tobacco. [name] reports previous alcohol use.   Review of Systems CONSTITUTIONAL: No weight loss, fever, chills, weakness or fatigue.  HEENT: Eyes: No visual loss, blurred vision, double vision or yellow sclerae.No hearing loss, sneezing, congestion, runny nose or sore throat.  SKIN: No rash or itching.  CARDIOVASCULAR: per hpi RESPIRATORY: No  shortness of breath, cough or sputum.  GASTROINTESTINAL: No anorexia, nausea, vomiting or diarrhea. No abdominal pain or blood.  GENITOURINARY: No burning on urination, no polyuria NEUROLOGICAL: No headache, dizziness, syncope, paralysis, ataxia, numbness or tingling in the extremities. No change in bowel or bladder control.  MUSCULOSKELETAL: No muscle, back pain, joint pain or stiffness.  LYMPHATICS: No enlarged nodes. No history of splenectomy.  PSYCHIATRIC: No history of depression or anxiety.  ENDOCRINOLOGIC: No reports of sweating, cold or heat intolerance. No polyuria or polydipsia.  Marland Kitchen   Physical Examination Today's Vitals   08/23/19 0950  BP: 114/82  Pulse: 65  SpO2: 97%  Weight: 215 lb 12.8 oz (97.9 kg)  Height: 5\' 5"  (1.651 m)   Body mass index is 35.91 kg/m.  Gen: resting comfortably, no acute distress HEENT: no scleral icterus, pupils equal round and reactive, no palptable cervical adenopathy,  CV: RRR, no m/r/g, no jvd Resp: Clear to auscultation bilaterally GI: abdomen is soft, non-tender, non-distended, normal bowel sounds, no hepatosplenomegaly MSK: extremities are warm, no edema.  Skin: warm, no rash Neuro:  no focal deficits Psych: appropriate affect     Assessment and Plan  1. Palpitations - will plan for 30 day event monitor - her chronic lung disease and sarcoid increase risk for arrhythmias  2.. Pulmonary HTN - elevated PASP and mild RV strain from echo during her PE - has repeat echo pending ordered by pulmonary - if ongoing pulm HTN may require additional workup    F/u 2 months   Arnoldo Lenis, M.D.

## 2019-08-26 ENCOUNTER — Encounter (HOSPITAL_COMMUNITY)
Admission: RE | Admit: 2019-08-26 | Discharge: 2019-08-26 | Disposition: A | Discharge status: Home / Self Care | Payer: 59 | Source: Ambulatory Visit | Attending: Nephrology | Admitting: Nephrology

## 2019-08-26 ENCOUNTER — Other Ambulatory Visit: Payer: Self-pay

## 2019-08-26 ENCOUNTER — Encounter (HOSPITAL_COMMUNITY): Payer: Self-pay

## 2019-08-26 DIAGNOSIS — D509 Iron deficiency anemia, unspecified: Secondary | ICD-10-CM | POA: Diagnosis not present

## 2019-08-26 MED ORDER — SODIUM CHLORIDE 0.9 % IV SOLN: Freq: Once | INTRAVENOUS | Status: AC

## 2019-08-26 MED ORDER — SODIUM CHLORIDE 0.9 % IV SOLN
510.0000 mg | Freq: Once | INTRAVENOUS | Status: AC
  Administered 2019-08-26: 510 mg via INTRAVENOUS
  Filled 2019-08-26: qty 17

## 2019-08-27 ENCOUNTER — Encounter: Payer: Self-pay | Admitting: Internal Medicine

## 2019-08-29 ENCOUNTER — Encounter: Payer: Self-pay | Admitting: Family Medicine

## 2019-08-29 ENCOUNTER — Telehealth (INDEPENDENT_AMBULATORY_CARE_PROVIDER_SITE_OTHER): Payer: 59 | Admitting: Family Medicine

## 2019-08-29 ENCOUNTER — Other Ambulatory Visit: Payer: Self-pay

## 2019-08-29 VITALS — BP 120/80 | Wt 212.0 lb

## 2019-08-29 DIAGNOSIS — M25561 Pain in right knee: Secondary | ICD-10-CM | POA: Diagnosis not present

## 2019-08-29 DIAGNOSIS — E785 Hyperlipidemia, unspecified: Secondary | ICD-10-CM | POA: Diagnosis not present

## 2019-08-29 DIAGNOSIS — D869 Sarcoidosis, unspecified: Secondary | ICD-10-CM

## 2019-08-29 DIAGNOSIS — R768 Other specified abnormal immunological findings in serum: Secondary | ICD-10-CM | POA: Diagnosis not present

## 2019-08-29 MED ORDER — ROSUVASTATIN CALCIUM 5 MG PO TABS: 5.0000 mg | ORAL_TABLET | Freq: Every day | ORAL | 1 refills | Status: DC

## 2019-08-29 NOTE — Assessment & Plan Note (Signed)
Dr. Wolfgang Phoenix called let me know that positive ANA had resulted.  We discussed the nature of her care feel rheumatology would be good neck steps and referral was made today and she is for this.

## 2019-08-29 NOTE — Progress Notes (Signed)
Virtual Visit via Telephone Note   This visit type was conducted due to national recommendations for restrictions regarding the COVID-19 Pandemic (e.g. social distancing) in an effort to limit this patient's exposure and mitigate transmission in our community.  Due to her co-morbid illnesses, this patient is at least at moderate risk for complications without adequate follow up.  This format is felt to be most appropriate for this patient at this time.  The patient did not have access to video technology/had technical difficulties with video requiring transitioning to audio format only (telephone).  All issues noted in this document were discussed and addressed.  No physical exam could be performed with this format.    Evaluation Performed:  Follow-up visit  Date:  08/29/2019   ID:  Hailey Ross, DOB 1971/01/28, MRN [id number]  Patient Location: Home Provider Location: Office  Location of Patient: Home Location of Provider: Telehealth Consent was obtain for visit to be over via telehealth. I verified that I am speaking with the correct person using two identifiers.  PCP:  Freddy Finner, NP   Chief Complaint:   Overall health follow-up  History of Present Illness:    Hailey Ross is a 49 y.o. female with a rather significant history over the last year.  She is now seeing several specialist to help identify various elements that she has suffered from.  She has a history of having sarcoidosis.  Stop smoking in 201 16.  Is now seeing Dr. Wolfgang Phoenix to help with chronic kidney disease.  Has a positive ANA that most recently resulted from several labs that he checked.  In discussion with him back on May 14 we decided that it would be good for Hailey Ross to see a rheumatologist which she is open to and referral will be made today.  Additionally she is getting IV infusions and B12 injections.  She reports that she is wearing her CPAP and doing really well with this. Dr. Wolfgang Phoenix also  sent her to the GI specialist as well.  Review notes in epic she did have an unprovoked PE and is currently on Xarelto.  She continues to have some joint swelling of her right ankle and knee.  She has had fluid drawn off of her knee.  She would like to go back to a different orthopedist as she did not feel like she got a good consult the first time around as it was reported to her that she could not have any surgery secondary to being on Xarelto.  The patient does not have symptoms concerning for COVID-19 infection (fever, chills, cough, or new shortness of breath).   Past Medical, Surgical, Social History, Allergies, and Medications have been Reviewed.  Past Medical History:  Diagnosis Date  . Acute cholecystitis 08/24/2016  . Acute kidney injury superimposed on chronic kidney disease (HCC) 06/20/2019  . Acute respiratory failure with hypoxia (HCC) 06/19/2019  . Annual visit for general adult medical examination with abnormal findings 04/16/2019  . Bronchiectasis (HCC)   . CKD (chronic kidney disease)   . Lightheadedness 05/10/2019  . PE (pulmonary thromboembolism) (HCC) 06/2019  . Pre-syncope 04/16/2019  . Sarcoidosis   . Sarcoidosis   . Smoker 02/12/2014   Past Surgical History:  Procedure Laterality Date  . CHOLECYSTECTOMY N/A 08/25/2016   Procedure: LAPAROSCOPIC CHOLECYSTECTOMY;  Surgeon: Abigail Miyamoto, MD;  Location: [facility] OR;  Service: General;  Laterality: N/A;  . TUBAL LIGATION  02/1993     Current Meds  Medication  Sig  . acetaminophen (TYLENOL) 500 MG tablet Take 500 mg by mouth every 4 (four) hours as needed for mild pain, moderate pain or headache.   . albuterol (PROAIR HFA) 108 (90 Base) MCG/ACT inhaler Inhale 2 puffs into the lungs every 6 (six) hours as needed for wheezing or shortness of breath.  Marland Kitchen albuterol (PROVENTIL) (2.5 MG/3ML) 0.083% nebulizer solution Take 3 mLs (2.5 mg total) by nebulization every 6 (six) hours as needed for wheezing or shortness of breath.  .  lisinopril (ZESTRIL) 2.5 MG tablet Take 2.5 mg by mouth daily.  . rivaroxaban (XARELTO) 20 MG TABS tablet Take 1 tablet (20 mg total) by mouth daily with supper.  . rosuvastatin (CRESTOR) 5 MG tablet Take 1 tablet (5 mg total) by mouth daily.  . Vitamin D, Ergocalciferol, (DRISDOL) 1.25 MG (50000 UNIT) CAPS capsule Take 1 capsule (50,000 Units total) by mouth every 7 (seven) days.  . [DISCONTINUED] rosuvastatin (CRESTOR) 5 MG tablet Take 1 tablet (5 mg total) by mouth daily.     Allergies:   Patient has no known allergies.   ROS:   Please see the history of present illness.    All other systems reviewed and are negative.   Labs/Other Tests and Data Reviewed:    Recent Labs: 04/22/2019: TSH 1.71 08/21/2019: ALT 18; BUN 12; Creatinine, Ser 1.19; Hemoglobin 12.4; Platelets 423; Potassium 3.9; Sodium 138   Recent Lipid Panel Lab Results  Component Value Date/Time   CHOL 230 (H) 04/22/2019 08:39 AM   TRIG 93 04/22/2019 08:39 AM   HDL 58 04/22/2019 08:39 AM   CHOLHDL 4.0 04/22/2019 08:39 AM   LDLCALC 152 (H) 04/22/2019 08:39 AM    Wt Readings from Last 3 Encounters:  08/29/19 212 lb (96.2 kg)  08/23/19 215 lb 12.8 oz (97.9 kg)  08/21/19 214 lb (97.1 kg)     Objective:    Vital Signs:  BP 120/80   Wt 212 lb (96.2 kg)   LMP 08/03/2019   BMI 35.28 kg/m    VITAL SIGNS:  reviewed GEN:  Alert and oriented RESPIRATORY:  No shortness of breath noted in conversation PSYCH:  Normal affect and mood  ASSESSMENT & PLAN:    1. Hyperlipidemia, unspecified hyperlipidemia type  - rosuvastatin (CRESTOR) 5 MG tablet; Take 1 tablet (5 mg total) by mouth daily.  Dispense: 90 tablet; Refill: 1  2. Right knee pain, unspecified chronicity  - Ambulatory referral to Orthopedic Surgery  3. Sarcoidosis  - Ambulatory referral to Rheumatology  4. Positive ANA (antinuclear antibody)  - Ambulatory referral to Rheumatology   Time:   Today, I have spent 10 minutes with the patient with  telehealth technology discussing the above problems.     Medication Adjustments/Labs and Tests Ordered: Current medicines are reviewed at length with the patient today.  Concerns regarding medicines are outlined above.   Tests Ordered: No orders of the defined types were placed in this encounter.   Medication Changes: Meds ordered this encounter  Medications  . rosuvastatin (CRESTOR) 5 MG tablet    Sig: Take 1 tablet (5 mg total) by mouth daily.    Dispense:  90 tablet    Refill:  1    Disposition:  Follow up 8 weeks Signed, Perlie Mayo, NP  08/29/2019 3:49 PM     [facility]

## 2019-08-29 NOTE — Assessment & Plan Note (Signed)
Ongoing right knee pain with effusion has had blood drawn off of it.  MRI demonstrated some changes referral to orthopedics in [place].

## 2019-08-29 NOTE — Assessment & Plan Note (Signed)
Has been seen by pulmonology earlier in the spring.  Sarcoidosis appeared stable.  They continued her current regimen.  And will check PFTs on return.  She is to continue Xarelto was referred to hematology for unprovoked PE E.  She is avoiding NSAIDs.  Echo was stable.  Sleep study now on CPAP.  Overall is doing well

## 2019-08-29 NOTE — Patient Instructions (Signed)
I appreciate the opportunity to provide you with care for your health and wellness. Today we discussed: Referral needs, overall health.  Follow up: 8 weeks  No labs-Dr. Wolfgang Phoenix is checking these.  Referral for Ortho, rheumatology made.  I will continue to pray for you and hope that we figure out what is going on so that you can receive the treatment that you need.  Please let me know if you need me before the next appointment in 8 weeks..  Please continue to practice social distancing to keep you, your family, and our community safe.  If you must go out, please wear a mask and practice good handwashing.  It was a pleasure to see you and I look forward to continuing to work together on your health and well-being. Please do not hesitate to call the office if you need care or have questions about your care.  Have a wonderful day and week. With Gratitude, Tereasa Coop, DNP, AGNP-BC

## 2019-08-29 NOTE — Assessment & Plan Note (Addendum)
Stable doing well continue Crestor low-fat heart healthy diet encouraged.

## 2019-09-02 ENCOUNTER — Encounter (HOSPITAL_COMMUNITY)
Admission: RE | Admit: 2019-09-02 | Discharge: 2019-09-02 | Disposition: A | Discharge status: Home / Self Care | Payer: 59 | Source: Ambulatory Visit | Attending: Nephrology | Admitting: Nephrology

## 2019-09-02 ENCOUNTER — Ambulatory Visit (INDEPENDENT_AMBULATORY_CARE_PROVIDER_SITE_OTHER): Payer: 59

## 2019-09-02 ENCOUNTER — Encounter: Payer: Self-pay | Admitting: Internal Medicine

## 2019-09-02 ENCOUNTER — Other Ambulatory Visit: Payer: Self-pay

## 2019-09-02 ENCOUNTER — Encounter (HOSPITAL_COMMUNITY): Payer: Self-pay

## 2019-09-02 DIAGNOSIS — R002 Palpitations: Secondary | ICD-10-CM

## 2019-09-02 DIAGNOSIS — D509 Iron deficiency anemia, unspecified: Secondary | ICD-10-CM | POA: Diagnosis not present

## 2019-09-02 MED ORDER — SODIUM CHLORIDE 0.9 % IV SOLN
510.0000 mg | Freq: Once | INTRAVENOUS | Status: AC
  Administered 2019-09-02: 510 mg via INTRAVENOUS
  Filled 2019-09-02: qty 17

## 2019-09-02 MED ORDER — SODIUM CHLORIDE 0.9 % IV SOLN: Freq: Once | INTRAVENOUS | Status: AC

## 2019-09-03 ENCOUNTER — Telehealth: Payer: Self-pay | Admitting: Adult Health

## 2019-09-03 DIAGNOSIS — I272 Pulmonary hypertension, unspecified: Secondary | ICD-10-CM

## 2019-09-03 NOTE — Telephone Encounter (Signed)
Plan from last OV with TP 08/02/19 Patient Instructions  Continue on Xarelto .  2 D echo in June .  Advance activity as tolerated.  Set up for CPAP At bedtime   Wear for at least 4-6 hrs or more each night .  Do not drive if sleepy .  Work on healthy weight loss .  Follow up in 2-3  months with Dr. Craige Cotta or [place] NP  With PFT and As needed    Looked at pt's chart ad I see that an echo order was placed by TP 07/02/19. Johny Drilling, please advise if you still cannot see this order and if not, we will place a new order.

## 2019-09-03 NOTE — Telephone Encounter (Signed)
There is not a order for Echo to be scheduled will need a order

## 2019-09-03 NOTE — Telephone Encounter (Signed)
New order has been placed. 

## 2019-09-03 NOTE — Telephone Encounter (Signed)
That order was completed in March there is no new order I need one please

## 2019-09-04 ENCOUNTER — Other Ambulatory Visit: Payer: Self-pay

## 2019-09-04 DIAGNOSIS — D509 Iron deficiency anemia, unspecified: Secondary | ICD-10-CM

## 2019-09-04 NOTE — Telephone Encounter (Signed)
Please place the referral for her. Thank you

## 2019-09-11 ENCOUNTER — Other Ambulatory Visit: Payer: Self-pay

## 2019-09-11 ENCOUNTER — Ambulatory Visit (INDEPENDENT_AMBULATORY_CARE_PROVIDER_SITE_OTHER): Payer: 59 | Admitting: Orthopaedic Surgery

## 2019-09-11 ENCOUNTER — Encounter: Payer: Self-pay | Admitting: Orthopaedic Surgery

## 2019-09-11 VITALS — Ht 65.0 in | Wt 212.0 lb

## 2019-09-11 DIAGNOSIS — M25561 Pain in right knee: Secondary | ICD-10-CM | POA: Diagnosis not present

## 2019-09-11 DIAGNOSIS — G8929 Other chronic pain: Secondary | ICD-10-CM

## 2019-09-11 MED ORDER — BUPIVACAINE HCL 0.5 % IJ SOLN
2.0000 mL | INTRAMUSCULAR | Status: AC | PRN
  Administered 2019-09-11: 2 mL via INTRA_ARTICULAR

## 2019-09-11 MED ORDER — METHYLPREDNISOLONE ACETATE 40 MG/ML IJ SUSP
40.0000 mg | INTRAMUSCULAR | Status: AC | PRN
  Administered 2019-09-11: 40 mg via INTRA_ARTICULAR

## 2019-09-11 MED ORDER — LIDOCAINE HCL 1 % IJ SOLN
2.0000 mL | INTRAMUSCULAR | Status: AC | PRN
Start: 2019-09-11 — End: 2019-09-11
  Administered 2019-09-11: 2 mL

## 2019-09-11 NOTE — Progress Notes (Signed)
Office Visit Note   Patient: Hailey Ross           Date of Birth: 23-Dec-1970           MRN: [id number] Visit Date: 09/11/2019              Requested by: Perlie Mayo, NP [address] [place],  [place] [address] PCP: Perlie Mayo, NP   Assessment & Plan: Visit Diagnoses:  1. Chronic pain of right knee     Plan: Impression is right knee tricompartmental DJD with effusion.  I was able to aspirate approximately 45 cc of blood-tinged joint fluid today.  Cortisone was injected as well.  Compression wrap applied.  This fluid was sent to the lab.  She will resume activity as tolerated.  Follow-up as needed.  Follow-Up Instructions: Return if symptoms worsen or fail to improve.   Orders:  Orders Placed This Encounter  Procedures  . Cell count + diff,  w/ cryst-synvl fld   No orders of the defined types were placed in this encounter.     Procedures: Large Joint Inj: R knee on 09/11/2019 5:46 PM Indications: pain Details: 22 G needle  Arthrogram: No  Medications: 40 mg methylPREDNISolone acetate 40 MG/ML; 2 mL lidocaine 1 %; 2 mL bupivacaine 0.5 % Consent was given by the patient. Patient was prepped and draped in the usual sterile fashion.       Clinical Data: No additional findings.   Subjective: Chief Complaint  Patient presents with  . Right Knee - Pain, New Patient (Initial Visit)    Hailey Ross is a 49 year old new patient here for evaluation of right knee pain since January.  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  She also had synovitis as well and a large joint effusion.  She was originally evaluated by Dr. Luna Glasgow in [place] who was unsuccessful in aspirating her knee.  She is on chronic Xarelto for history of PE.  She also has sarcoidosis.  She works at the [employer].  Denies any constitutional symptoms.   Review of Systems  Constitutional: Negative.     HENT: Negative.   Eyes: Negative.   Respiratory: Negative.   Cardiovascular: Negative.   Endocrine: Negative.   Musculoskeletal: Negative.   Neurological: Negative.   Hematological: Negative.   Psychiatric/Behavioral: Negative.   All other systems reviewed and are negative.    Objective: Vital Signs: Ht 5\' 5"  (1.651 m)   Wt 212 lb (96.2 kg)   BMI 35.28 kg/m   Physical Exam Vitals and nursing note reviewed.  Constitutional:      Appearance: She is well-developed.  HENT:     Head: Normocephalic and atraumatic.  Pulmonary:     Effort: Pulmonary effort is normal.  Abdominal:     Palpations: Abdomen is soft.  Musculoskeletal:     Cervical back: Neck supple.  Skin:    General: Skin is warm.     Capillary Refill: Capillary refill takes less than 2 seconds.  Neurological:     Mental Status: She is alert and oriented to person, place, and time.  Psychiatric:        Behavior: Behavior normal.        Thought Content: Thought content normal.        Judgment: Judgment normal.     Ortho Exam Right knee shows a large joint effusion.  No warmth or signs  of infection.  Slight limitation in range of motion secondary to the effusion.  Collaterals and cruciates are stable. Specialty Comments:  No specialty comments available.  Imaging: No results found.   PMFS History: Patient Active Problem List   Diagnosis Date Noted  . Right knee pain 08/29/2019  . Positive ANA (antinuclear antibody) 08/29/2019  . Essential hypertension 07/04/2019  . Pain and swelling of right knee 07/04/2019  . Hyperlipidemia 07/04/2019  . Stage 2 chronic kidney disease 07/04/2019  . 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 Encounter for support and coordination of transition of care 07/04/2019  . Daytime hypersomnolence 07/02/2019  . Pulmonary hypertension (HCC) 07/02/2019  . CKD (chronic kidney disease)   . Bilateral pulmonary embolism (HCC) 06/18/2019  . Vitamin D deficiency 05/10/2019  . Class  2 obesity due to excess calories with body mass index (BMI) of 37.0 to 37.9 in adult 04/16/2019  . Sarcoidosis 05/04/2015  . Bronchiectasis (HCC) 05/04/2015   Past Medical History:  Diagnosis Date  . Acute cholecystitis 08/24/2016  . Acute kidney injury superimposed on chronic kidney disease (HCC) 06/20/2019  . Acute respiratory failure with hypoxia (HCC) 06/19/2019  . Annual visit for general adult medical examination with abnormal findings 04/16/2019  . Bronchiectasis (HCC)   . CKD (chronic kidney disease)   . Lightheadedness 05/10/2019  . PE (pulmonary thromboembolism) (HCC) 06/2019  . Pre-syncope 04/16/2019  . Sarcoidosis   . Sarcoidosis   . Smoker 02/12/2014    Family History  Problem Relation Age of Onset  . Hypertension Mother   . Cancer Mother     Past Surgical History:  Procedure Laterality Date  . CHOLECYSTECTOMY N/A 08/25/2016   Procedure: LAPAROSCOPIC CHOLECYSTECTOMY;  Surgeon: Abigail Miyamoto, MD;  Location: [facility] OR;  Service: General;  Laterality: N/A;  . TUBAL LIGATION  02/1993   Social History   Occupational History  . Occupation: Administrator, [employer] ([employer])  Tobacco Use  . Smoking status: Former Smoker    Packs/day: 1.00    Years: 20.00    Pack years: 20.00    Types: Cigarettes    Quit date: 03/21/2015    Years since quitting: 4.4  . Smokeless tobacco: Never Used  Substance and Sexual Activity  . Alcohol use: Not Currently    Alcohol/week: 0.0 standard drinks    Comment: Rare  . Drug use: No  . Sexual activity: Yes    Birth control/protection: Surgical

## 2019-09-12 ENCOUNTER — Telehealth: Payer: Self-pay

## 2019-09-12 LAB — TIQ-NTM

## 2019-09-12 LAB — SYNOVIAL CELL COUNT + DIFF, W/ CRYSTALS
Basophils, %: 0 %
Eosinophils-Synovial: 0 % (ref 0–2)
Lymphocytes-Synovial Fld: 59 % (ref 0–74)
Monocyte/Macrophage: 9 % (ref 0–69)
Neutrophil, Synovial: 32 % — ABNORMAL HIGH (ref 0–24)
Synoviocytes, %: 0 % (ref 0–15)
WBC, Synovial: 246 cells/uL — ABNORMAL HIGH (ref <150)

## 2019-09-12 NOTE — Telephone Encounter (Signed)
Pt had lab work done today, and she thought you was going to order Labs for Iron levels... She stopped by to tell you they was not there --please advise

## 2019-09-12 NOTE — Telephone Encounter (Signed)
She is being followed by Card and Kidney, I also referred her to GI. I did not order the labs she had yesterday. It looks like Dr Roda Shutters did them.

## 2019-09-13 ENCOUNTER — Other Ambulatory Visit: Payer: Self-pay

## 2019-09-13 ENCOUNTER — Ambulatory Visit (HOSPITAL_COMMUNITY): Payer: 59 | Attending: Cardiology

## 2019-09-13 DIAGNOSIS — I272 Pulmonary hypertension, unspecified: Secondary | ICD-10-CM | POA: Insufficient documentation

## 2019-09-13 MED ORDER — PERFLUTREN LIPID MICROSPHERE
1.0000 mL | INTRAVENOUS | Status: AC | PRN
  Administered 2019-09-13: 1 mL via INTRAVENOUS

## 2019-09-15 ENCOUNTER — Encounter (HOSPITAL_COMMUNITY): Payer: Self-pay | Admitting: Emergency Medicine

## 2019-09-15 ENCOUNTER — Emergency Department (HOSPITAL_COMMUNITY)
Admission: EM | Admit: 2019-09-15 | Discharge: 2019-09-15 | Disposition: A | Discharge status: Home / Self Care | Payer: 59 | Attending: Emergency Medicine | Admitting: Emergency Medicine

## 2019-09-15 ENCOUNTER — Other Ambulatory Visit: Payer: Self-pay

## 2019-09-15 DIAGNOSIS — R42 Dizziness and giddiness: Secondary | ICD-10-CM | POA: Diagnosis not present

## 2019-09-15 DIAGNOSIS — I129 Hypertensive chronic kidney disease with stage 1 through stage 4 chronic kidney disease, or unspecified chronic kidney disease: Secondary | ICD-10-CM | POA: Insufficient documentation

## 2019-09-15 DIAGNOSIS — N182 Chronic kidney disease, stage 2 (mild): Secondary | ICD-10-CM | POA: Insufficient documentation

## 2019-09-15 DIAGNOSIS — Z7901 Long term (current) use of anticoagulants: Secondary | ICD-10-CM | POA: Insufficient documentation

## 2019-09-15 DIAGNOSIS — Z79899 Other long term (current) drug therapy: Secondary | ICD-10-CM | POA: Diagnosis not present

## 2019-09-15 DIAGNOSIS — Z87891 Personal history of nicotine dependence: Secondary | ICD-10-CM | POA: Diagnosis not present

## 2019-09-15 HISTORY — DX: Iron deficiency: E61.1

## 2019-09-15 LAB — BASIC METABOLIC PANEL
Anion gap: 9 (ref 5–15)
BUN: 15 mg/dL (ref 6–20)
CO2: 24 mmol/L (ref 22–32)
Calcium: 9.2 mg/dL (ref 8.9–10.3)
Chloride: 105 mmol/L (ref 98–111)
Creatinine, Ser: 1.23 mg/dL — ABNORMAL HIGH (ref 0.44–1.00)
GFR calc Af Amer: 60 mL/min — ABNORMAL LOW (ref >60)
GFR calc non Af Amer: 51 mL/min — ABNORMAL LOW (ref >60)
Glucose, Bld: 120 mg/dL — ABNORMAL HIGH (ref 70–99)
Potassium: 3.8 mmol/L (ref 3.5–5.1)
Sodium: 138 mmol/L (ref 135–145)

## 2019-09-15 LAB — POC URINE PREG, ED: Preg Test, Ur: NEGATIVE

## 2019-09-15 LAB — CBC
HCT: 41.6 % (ref 36.0–46.0)
Hemoglobin: 12.7 g/dL (ref 12.0–15.0)
MCH: 27 pg (ref 26.0–34.0)
MCHC: 30.5 g/dL (ref 30.0–36.0)
MCV: 88.3 fL (ref 80.0–100.0)
Platelets: 364 10*3/uL (ref 150–400)
RBC: 4.71 MIL/uL (ref 3.87–5.11)
RDW: 13.6 % (ref 11.5–15.5)
WBC: 12.4 10*3/uL — ABNORMAL HIGH (ref 4.0–10.5)
nRBC: 0 % (ref 0.0–0.2)

## 2019-09-15 MED ORDER — SODIUM CHLORIDE 0.9% FLUSH: 3.0000 mL | Freq: Once | INTRAVENOUS | Status: DC

## 2019-09-15 NOTE — ED Notes (Signed)
Ortho static VS  Lying   100 per cent RA, 68, 18,134/88  Sitting  100 per cent RA, 60, 18,130/77  Standing   99 per cent, 68, 18, 134/88

## 2019-09-15 NOTE — ED Notes (Signed)
Patient is wearing a heart monitor at this time.

## 2019-09-15 NOTE — ED Triage Notes (Addendum)
Patient c/o intermittent dizziness. Per patient started this morning at 7am. Patient states dizziness is only with movement. Per patient hx of dizziness in which she is being seen for to get diagnosis. Patient was on meclizine with no relief at one time. Patient states told most likely related to iron deficiency in which she had x2 infusion- last infusion 5/24. Patient states menstruation started yesterday and became heavy this morning. Denies any neurological deficits.

## 2019-09-15 NOTE — ED Notes (Signed)
Pt reports a PE in march   Hx of sarcoidosis   Being worked up for dizziness with tilt towards anemia after iron infusion x 2   Started menses and reports she felt it might be why she was dizzy   Reports better now

## 2019-09-15 NOTE — Discharge Instructions (Addendum)
Your labs and exam are reassuring today.  You are not anemic, even though your menses has been heavy.  I recommend follow up as you have planned. Use caution when you stand, don't stand quickly or walk away from your seat until the transient lightheadedness has resolved.

## 2019-09-18 ENCOUNTER — Telehealth: Payer: Self-pay

## 2019-09-18 ENCOUNTER — Ambulatory Visit (INDEPENDENT_AMBULATORY_CARE_PROVIDER_SITE_OTHER): Payer: 59 | Admitting: Internal Medicine

## 2019-09-18 ENCOUNTER — Encounter: Payer: Self-pay | Admitting: Internal Medicine

## 2019-09-18 VITALS — BP 124/72 | HR 60 | Ht 65.0 in | Wt 211.6 lb

## 2019-09-18 DIAGNOSIS — R109 Unspecified abdominal pain: Secondary | ICD-10-CM | POA: Diagnosis not present

## 2019-09-18 DIAGNOSIS — K625 Hemorrhage of anus and rectum: Secondary | ICD-10-CM | POA: Diagnosis not present

## 2019-09-18 DIAGNOSIS — Z7901 Long term (current) use of anticoagulants: Secondary | ICD-10-CM | POA: Diagnosis not present

## 2019-09-18 DIAGNOSIS — K219 Gastro-esophageal reflux disease without esophagitis: Secondary | ICD-10-CM

## 2019-09-18 MED ORDER — SUTAB 1479-225-188 MG PO TABS
1.0000 | ORAL_TABLET | Freq: Once | ORAL | 0 refills | Status: AC
Start: 2019-09-18 — End: 2019-09-18

## 2019-09-18 NOTE — Progress Notes (Signed)
HISTORY OF PRESENT ILLNESS:  Hailey Ross is a 49 y.o. 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  Patient has had prior cholecystectomy.  Medical problems include sarcoidosis, chronic renal insufficiency, and acute pulmonary embolism March 2021 for which she was hospitalized and currently anticoagulated with Xarelto.  Past several months she describes lower abdominal pain.  She also reports alternating bowel habits with diarrhea greater than constipation.  Occasional severe abdominal cramping associated with nausea and vomiting as well as diaphoresis.  She does not take laxatives or antidiarrheals.  She has experienced bleeding.  As well, vaginal bleeding.  She is followed by pulmonary and nephrology in addition to her PCP.  Review of outside blood work September 15, 2019 reveals normal hemoglobin of 12.7.  Normal MCV.  Creatinine 1.23.  CT scan of the abdomen and pelvis, to evaluate lower abdominal pain vomiting, was performed Aug 22, 2016.  This was performed with contrast.  This revealed gallbladder wall thickening and gallstones for which she subsequently underwent her cholecystectomy.  No other abnormalities.  She denies family history of colon cancer.  She also complains of GERD.  No dysphagia.  Not on regular therapy.  Former smoker.  Does not use alcohol.  She has completed her Covid vaccination series  REVIEW OF SYSTEMS:  All non-GI ROS negative unless otherwise stated in the HPI except for heart rhythm change, menstrual pain, muscle cramps, ankle swelling, excessive urination, urinary leakage  Past Medical History:  Diagnosis Date  . Acute cholecystitis 08/24/2016  . Acute kidney injury superimposed on chronic kidney disease (HCC) 06/20/2019  . Acute respiratory failure with hypoxia (HCC) 06/19/2019  . Annual visit for general adult medical examination with abnormal findings  04/16/2019  . Bronchiectasis (HCC)   . CKD (chronic kidney disease)   . Iron deficiency   . Lightheadedness 05/10/2019  . PE (pulmonary thromboembolism) (HCC) 06/2019  . Pre-syncope 04/16/2019  . Sarcoidosis   . Sarcoidosis   . Smoker 02/12/2014    Past Surgical History:  Procedure Laterality Date  . CHOLECYSTECTOMY N/A 08/25/2016   Procedure: LAPAROSCOPIC CHOLECYSTECTOMY;  Surgeon: Hailey Miyamoto, MD;  Location: [facility];  Service: General;  Laterality: N/A;  . TUBAL LIGATION  02/1993    Social History Hailey Ross  reports that she quit smoking about 4 years ago. Her smoking use included cigarettes. She has a 20.00 pack-year smoking history. She has never used smokeless tobacco. She reports current alcohol use. She reports that she does not use drugs.  family history includes Cancer in her mother; Colon polyps in her mother; Hypertension in her mother.  No Known Allergies     PHYSICAL EXAMINATION: Vital signs: BP 124/72   Pulse 60   Ht 5\' 5"  (1.651 m)   Wt 211 lb 9.6 oz (96 kg)   LMP 09/14/2019   SpO2 96%   BMI 35.21 kg/m   Constitutional: generally well-appearing, no acute distress Psychiatric: alert and oriented x3, cooperative Eyes: extraocular movements intact, anicteric, conjunctiva pink Mouth: oral pharynx moist, no lesions Neck: supple no lymphadenopathy Cardiovascular: heart regular rate and rhythm, no murmur Lungs: clear to auscultation bilaterally Abdomen: soft, nontender, nondistended, no obvious ascites, no peritoneal signs, normal bowel sounds, no organomegaly Rectal: Deferred to colonoscopy Extremities: no clubbing, cyanosis, or lower extremity edema bilaterally Skin: no lesions on visible extremities Neuro: No focal deficits.  Cranial nerves intact  ASSESSMENT:  1.  Intermittent rectal bleeding.  Etiology unclear.  On Xarelto 2.  Intermittent lower abdominal discomfort over the past several months.  Etiology unclear. 3.  Irregular bowel habits  past several months. 4.  Multiple medical problems including recent pulmonary embolism for which she is on Xarelto 5.  GERD.  On no therapy 6.  Status post cholecystectomy 2018   PLAN:  1.  Schedule colonoscopy to evaluate rectal bleeding and lower abdominal pain.  The patient is HIGH RISK given her comorbidities and need to stay on Xarelto therapy for the evaluation due to her recent acute pulmonary embolism.  I explained her the risks and limitations of performing endoscopic procedures on anticoagulation therapy.  She understands.The nature of the procedure, as well as the risks, benefits, and alternatives were carefully and thoroughly reviewed with the patient. Ample time for discussion and questions allowed. The patient understood, was satisfied, and agreed to proceed. 2.  Schedule upper endoscopy to evaluate abdominal pain chronic GERD.  She is high risk as above.The nature of the procedure, as well as the risks, benefits, and alternatives were carefully and thoroughly reviewed with the patient. Ample time for discussion and questions allowed. The patient understood, was satisfied, and agreed to proceed. 3.  May benefit from chronic PPI therapy.  To be determined 4.  Ongoing general medical care with PCP and specialist

## 2019-09-18 NOTE — Telephone Encounter (Signed)
Patient is scheduled to see Dr. Marina Goodell on 09/20/19 for at [facility] at 3:30pm. She was seen in the office today 09/18/19. When prepping chart noticed patient had extensive respiratory issues. Cathlyn Parsons, CRNA evaluated patient chart on 09/18/19 and cleared pt to be seen at [facility].

## 2019-09-18 NOTE — Patient Instructions (Signed)
You have been scheduled for an endoscopy and colonoscopy. Please follow the written instructions given to you at your visit today. Please pick up your prep supplies at the pharmacy within the next 1-3 days. If you use inhalers (even only as needed), please bring them with you on the day of your procedure.  

## 2019-09-18 NOTE — ED Provider Notes (Signed)
[facility] EMERGENCY DEPARTMENT Provider Note   CSN: [id number] Arrival date & time: 09/15/19  1225     History Chief Complaint  Patient presents with  . Dizziness    Hailey Ross is a 49 y.o. female with a history of sarcoidosis, bronchiectasis, history of PE on chronic Xaralto, chronic kidney disease, HTN, and has heavy menses presenting with increased  Lightheadedness this am.  She started her menses yesterday which has been excessively heavy and when she woke today noted her lightheadedness is worse.  She describes a transient dizziness when she first stands from a lying or seated position.  After a minute, her symptom resolves.  She denies room spinning quality. She has been evaluated for this in the past, including a trial of meclizine which was not helpful, but has needed blood transfusion in the past secondary to anemia which did help, therefore provider concluded her sx are most likely due to iron deficiency.  Her last infusion was on 5/24.  With her heavy menses, came in to get her hemoglobin checked.  She denies cp, sob, no other complaints including no focal weakness. She does have a history of palpitations, currently being evaluated by Dr.Branch who is arranged a monitor for her. She denies palpitations today.  The history is provided by the patient.       Past Medical History:  Diagnosis Date  . Acute cholecystitis 08/24/2016  . Acute kidney injury superimposed on chronic kidney disease ([place]) 06/20/2019  . Acute respiratory failure with hypoxia ([place]) 06/19/2019  . Annual visit for general adult medical examination with abnormal findings 04/16/2019  . Bronchiectasis ([place])   . CKD (chronic kidney disease)   . Iron deficiency   . Lightheadedness 05/10/2019  . PE (pulmonary thromboembolism) ([place]) 06/2019  . Pre-syncope 04/16/2019  . Sarcoidosis   . Sarcoidosis   . Smoker 02/12/2014    Patient Active Problem List   Diagnosis Date Noted  . Right knee pain 08/29/2019  .  Positive ANA (antinuclear antibody) 08/29/2019  . Essential hypertension 07/04/2019  . Pain and swelling of right knee 07/04/2019  . Hyperlipidemia 07/04/2019  . Stage 2 chronic kidney disease 07/04/2019  . Sarcoidosis of lung ([place]) 07/04/2019  . Encounter for support and coordination of transition of care 07/04/2019  . Daytime hypersomnolence 07/02/2019  . Pulmonary hypertension ([place]) 07/02/2019  . CKD (chronic kidney disease)   . Bilateral pulmonary embolism ([place]) 06/18/2019  . Vitamin D deficiency 05/10/2019  . Class 2 obesity due to excess calories with body mass index (BMI) of 37.0 to 37.9 in adult 04/16/2019  . Sarcoidosis 05/04/2015  . Bronchiectasis ([place]) 05/04/2015    Past Surgical History:  Procedure Laterality Date  . CHOLECYSTECTOMY N/A 08/25/2016   Procedure: LAPAROSCOPIC CHOLECYSTECTOMY;  Surgeon: Coralie Keens, MD;  Location: [place] [place];  Service: General;  Laterality: N/A;  . TUBAL LIGATION  02/1993     OB History    Gravida  3   Para  3   Term  3   Preterm      AB      Living  3     SAB      TAB      Ectopic      Multiple      Live Births              Family History  Problem Relation Age of Onset  . Hypertension Mother   . Cancer Mother   . Colon polyps Mother  Social History   Tobacco Use  . Smoking status: Former Smoker    Packs/day: 1.00    Years: 20.00    Pack years: 20.00    Types: Cigarettes    Quit date: 03/21/2015    Years since quitting: 4.4  . Smokeless tobacco: Never Used  Substance Use Topics  . Alcohol use: Yes    Alcohol/week: 0.0 standard drinks    Comment: Rare  . Drug use: No    Home Medications Prior to Admission medications   Medication Sig Start Date End Date Taking? Authorizing Provider  acetaminophen (TYLENOL) 500 MG tablet Take 500 mg by mouth every 4 (four) hours as needed for mild pain, moderate pain or headache.     [provider]  albuterol (PROAIR HFA) 108 (90 Base) MCG/ACT  inhaler Inhale 2 puffs into the lungs every 6 (six) hours as needed for wheezing or shortness of breath. 04/23/19   Chesley Mires, MD  albuterol (PROVENTIL) (2.5 MG/3ML) 0.083% nebulizer solution Take 3 mLs (2.5 mg total) by nebulization every 6 (six) hours as needed for wheezing or shortness of breath. 07/22/19   Parrett, Fonnie Mu, NP  lisinopril (ZESTRIL) 2.5 MG tablet Take 2.5 mg by mouth daily. 08/21/19   [provider]  rivaroxaban (XARELTO) 20 MG TABS tablet Take 1 tablet (20 mg total) by mouth daily with supper. 07/08/19   Parrett, Fonnie Mu, NP  rosuvastatin (CRESTOR) 5 MG tablet Take 1 tablet (5 mg total) by mouth daily. 08/29/19   Perlie Mayo, NP  Sodium Sulfate-Mag Sulfate-KCl (SUTAB) [phone] MG TABS Take 1 kit by mouth once for 1 dose. BIN: [id number] PCN: CN GROUP: [id number] MEMBER ID: [id number];FFM AS CASH 09/18/19 09/18/19  Irene Shipper, MD  Vitamin D, Ergocalciferol, (DRISDOL) 1.25 MG (50000 UNIT) CAPS capsule Take 1 capsule (50,000 Units total) by mouth every 7 (seven) days. 07/08/19   Perlie Mayo, NP    Allergies    Patient has no known allergies.  Review of Systems   Review of Systems  Constitutional: Negative for chills and fever.  HENT: Negative for congestion and sore throat.   Eyes: Negative.   Respiratory: Negative for chest tightness and shortness of breath.   Cardiovascular: Negative for chest pain.  Gastrointestinal: Negative for abdominal pain, nausea and vomiting.  Genitourinary: Positive for menstrual problem.  Musculoskeletal: Negative for arthralgias, joint swelling and neck pain.  Skin: Negative.  Negative for rash and wound.  Neurological: Positive for light-headedness. Negative for weakness, numbness and headaches.  Psychiatric/Behavioral: Negative.     Physical Exam Updated Vital Signs BP 111/71 (BP Location: Right Arm)   Pulse 63   Temp 98.7 F (37.1 C) (Oral)   Resp 18   Ht _0  (1.651 m)   Wt 97.5 kg   LMP 09/14/2019   SpO2 100%    BMI 35.78 kg/m   Physical Exam Vitals and nursing note reviewed.  Constitutional:      Appearance: Normal appearance. She is well-developed.  HENT:     Head: Normocephalic and atraumatic.  Eyes:     Extraocular Movements:     Right eye: Normal extraocular motion and no nystagmus.     Left eye: Normal extraocular motion and no nystagmus.     Conjunctiva/sclera: Conjunctivae normal.     Comments: No conjunctival pallor.  Cardiovascular:     Rate and Rhythm: Normal rate and regular rhythm.     Heart sounds: Normal heart sounds.  Pulmonary:     Effort:  Pulmonary effort is normal.     Breath sounds: Normal breath sounds. No wheezing.  Abdominal:     General: Bowel sounds are normal.     Palpations: Abdomen is soft.     Tenderness: There is no abdominal tenderness.  Musculoskeletal:        General: Normal range of motion.     Cervical back: Normal range of motion.  Skin:    General: Skin is warm and dry.  Neurological:     General: No focal deficit present.     Mental Status: She is alert and oriented to person, place, and time.     Cranial Nerves: No cranial nerve deficit.     Sensory: No sensory deficit.     Coordination: Coordination normal.     Gait: Gait normal.  Psychiatric:        Mood and Affect: Mood normal.     ED Results / Procedures / Treatments   Labs (all labs ordered are listed, but only abnormal results are displayed) Labs Reviewed  BASIC METABOLIC PANEL - Abnormal; Notable for the following components:      Result Value   Glucose, Bld 120 (*)    Creatinine, Ser 1.23 (*)    GFR calc non Af Amer 51 (*)    GFR calc Af Amer 60 (*)    All other components within normal limits  CBC - Abnormal; Notable for the following components:   WBC 12.4 (*)    All other components within normal limits  POC URINE PREG, ED    EKG EKG Interpretation  Date/Time:  Sunday September 15 2019 12:45:26 EDT Ventricular Rate:  61 PR Interval:  142 QRS Duration: 90 QT  Interval:  402 QTC Calculation: 404 R Axis:   -3 Text Interpretation: Normal sinus rhythm Minimal voltage criteria for LVH, may be normal variant ( R in aVL ) Confirmed by Randal Buba, April ([id number]) on 09/16/2019 8:47:01 AM   Radiology No results found.  Procedures Procedures (including critical care time)  Medications Ordered in ED Medications - No data to display  ED Course  I have reviewed the triage vital signs and the nursing notes.  Pertinent labs & imaging results that were available during my care of the patient were reviewed by me and considered in my medical decision making (see chart for details).    MDM Rules/Calculators/A&P                      Labs and ekg reviewed and are reassuring. She is not anemic today.  ekg sinus rhythm.  Increase fluid intake, avoid abrupt positional changes. F/u with Dr. Harl Bowie as planned.  The patient appears reasonably screened and/or stabilized for discharge and I doubt any other medical condition or other [facility] requiring further screening, evaluation, or treatment in the ED at this time prior to discharge.  Final Clinical Impression(s) / ED Diagnoses Final diagnoses:  Dizziness    Rx / DC Orders ED Discharge Orders    None       Landis Martins 09/18/19 1644    [place] Ferguson, MD 09/23/19 1516

## 2019-09-19 ENCOUNTER — Encounter: Payer: Self-pay | Admitting: Family Medicine

## 2019-09-20 ENCOUNTER — Other Ambulatory Visit: Payer: Self-pay

## 2019-09-20 ENCOUNTER — Ambulatory Visit (AMBULATORY_SURGERY_CENTER): Payer: 59 | Admitting: Internal Medicine

## 2019-09-20 ENCOUNTER — Encounter: Payer: Self-pay | Admitting: Internal Medicine

## 2019-09-20 VITALS — BP 122/77 | HR 57 | Temp 97.1°F | Resp 18 | Ht 65.0 in | Wt 211.0 lb

## 2019-09-20 DIAGNOSIS — K625 Hemorrhage of anus and rectum: Secondary | ICD-10-CM

## 2019-09-20 DIAGNOSIS — K219 Gastro-esophageal reflux disease without esophagitis: Secondary | ICD-10-CM | POA: Diagnosis not present

## 2019-09-20 DIAGNOSIS — Z1211 Encounter for screening for malignant neoplasm of colon: Secondary | ICD-10-CM

## 2019-09-20 DIAGNOSIS — R109 Unspecified abdominal pain: Secondary | ICD-10-CM

## 2019-09-20 MED ORDER — SODIUM CHLORIDE 0.9 % IV SOLN: 500.0000 mL | Freq: Once | INTRAVENOUS | Status: DC

## 2019-09-20 MED ORDER — PANTOPRAZOLE SODIUM 40 MG PO TBEC: 40.0000 mg | DELAYED_RELEASE_TABLET | Freq: Every day | ORAL | 11 refills | Status: DC

## 2019-09-20 NOTE — Progress Notes (Signed)
A/ox3, pleased with MAC, report to RN 

## 2019-09-20 NOTE — Op Note (Signed)
[facility] Patient Name: Hailey Ross Procedure Date: 09/20/2019 3:48 PM MRN: [id number] Endoscopist: [name] Marina Goodell , MD Age: 49 Referring MD:  Date of Birth: [birth year] Gender: Female Account #: [id number] Procedure:                Colonoscopy Indications:              Screening for colorectal malignant neoplasm.                            Questionable rectal versus vaginal bleeding. Medicines:                Monitored Anesthesia Care Procedure:                Pre-Anesthesia Assessment:                           - Prior to the procedure, a History and Physical                            was performed, and patient medications and                            allergies were reviewed. The patient's tolerance of                            previous anesthesia was also reviewed. The risks                            and benefits of the procedure and the sedation                            options and risks were discussed with the patient.                            All questions were answered, and informed consent                            was obtained. Prior Anticoagulants: The patient has                            taken Xarelto (rivaroxaban), last dose was day of                            procedure. ASA Grade Assessment: II - A patient                            with mild systemic disease. After reviewing the                            risks and benefits, the patient was deemed in                            satisfactory condition to undergo the procedure.  After obtaining informed consent, the colonoscope                            was passed under direct vision. Throughout the                            procedure, the patient's blood pressure, pulse, and                            oxygen saturations were monitored continuously. The                            Colonoscope was introduced through the anus and                            advanced to the  the cecum, identified by                            appendiceal orifice and ileocecal valve. The                            ileocecal valve, appendiceal orifice, and rectum                            were photographed. The quality of the bowel                            preparation was excellent. The colonoscopy was                            performed without difficulty. The patient tolerated                            the procedure well. The bowel preparation used was                            SUPREP via split dose instruction. Scope In: 4:00:08 PM Scope Out: 4:10:40 PM Scope Withdrawal Time: 0 hours 7 minutes 38 seconds  Total Procedure Duration: 0 hours 10 minutes 32 seconds  Findings:                 The entire examined colon appeared normal on direct                            and retroflexion views. Complications:            No immediate complications. Estimated blood loss:                            None. Estimated Blood Loss:     Estimated blood loss: none. Impression:               - The entire examined colon is normal on direct and  retroflexion views.                           - No specimens collected. Recommendation:           - Repeat colonoscopy in 10 years for screening                            purposes.                           - Patient has a contact number available for                            emergencies. The signs and symptoms of potential                            delayed complications were discussed with the                            patient. Return to normal activities tomorrow.                            Written discharge instructions were provided to the                            patient.                           - Resume previous diet.                           - Continue present medications. [name] Marina Goodell, MD 09/20/2019 4:14:16 PM This report has been signed electronically.

## 2019-09-20 NOTE — Op Note (Signed)
[place] Patient Name: Hailey Ross Procedure Date: 09/20/2019 3:48 PM MRN: [id number] Endoscopist: Docia Chuck. Henrene Pastor , MD Age: 49 Referring MD:  Date of Birth: 06-Sep-1970 Gender: Female Account #: [id number] Procedure:                Upper GI endoscopy with biopsies Indications:              Abdominal pain, Esophageal reflux Medicines:                Monitored Anesthesia Care Procedure:                Pre-Anesthesia Assessment:                           - Prior to the procedure, a History and Physical                            was performed, and patient medications and                            allergies were reviewed. The patient's tolerance of                            previous anesthesia was also reviewed. The risks                            and benefits of the procedure and the sedation                            options and risks were discussed with the patient.                            All questions were answered, and informed consent                            was obtained. Prior Anticoagulants: The patient has                            taken Xarelto (rivaroxaban), last dose was day of                            procedure. ASA Grade Assessment: III - A patient                            with severe systemic disease. After reviewing the                            risks and benefits, the patient was deemed in                            satisfactory condition to undergo the procedure.                           After obtaining informed consent, the endoscope was  passed under direct vision. Throughout the                            procedure, the patient's blood pressure, pulse, and                            oxygen saturations were monitored continuously. The                            Endoscope was introduced through the mouth, and                            advanced to the second part of duodenum. The upper                             GI endoscopy was accomplished without difficulty.                            The patient tolerated the procedure well. Scope In: Scope Out: Findings:                 The esophagus was normal.                           The stomach diffuse erythema with intact mucosa.                            The stomach was otherwise normal. Biopsies were                            taken with a cold forceps for Helicobacter pylori                            testing using CLOtest.                           The examined duodenum was normal.                           The cardia and gastric fundus were normal on                            retroflexion. Complications:            No immediate complications. Estimated Blood Loss:     Estimated blood loss: none. Impression:               1. Nonspecific gastric erythema EGD                           2. GERD Recommendation:           - Patient has a contact number available for                            emergencies. The signs and symptoms of potential  delayed complications were discussed with the                            patient. Return to normal activities tomorrow.                            Written discharge instructions were provided to the                            patient.                           - Resume previous diet.                           - Continue present medications, including Xarelto.                           - Await pathology results.                           - Prescribed pantoprazole 40 mg daily; #30; 11                            refills. This will help your acid reflux symptoms                            and mild inflammation of the stomach                           - Office follow-up with Dr. Marina Goodell in about 6 weeks [name] Marina Goodell, MD 09/20/2019 4:26:42 PM This report has been signed electronically.

## 2019-09-20 NOTE — Patient Instructions (Signed)
YOU HAD AN ENDOSCOPIC PROCEDURE TODAY AT THE [facility]:   Refer to the procedure report that was given to you for any specific questions about what was found during the examination.  If the procedure report does not answer your questions, please call your gastroenterologist to clarify.  If you requested that your care partner not be given the details of your procedure findings, then the procedure report has been included in a sealed envelope for you to review at your convenience later.  YOU SHOULD EXPECT: Some feelings of bloating in the abdomen. Passage of more gas than usual.  Walking can help get rid of the air that was put into your GI tract during the procedure and reduce the bloating. If you had a lower endoscopy (such as a colonoscopy or flexible sigmoidoscopy) you may notice spotting of blood in your stool or on the toilet paper. If you underwent a bowel prep for your procedure, you may not have a normal bowel movement for a few days.  Please Note:  You might notice some irritation and congestion in your nose or some drainage.  This is from the oxygen used during your procedure.  There is no need for concern and it should clear up in a day or so.  SYMPTOMS TO REPORT IMMEDIATELY:   Following lower endoscopy (colonoscopy or flexible sigmoidoscopy):  Excessive amounts of blood in the stool  Significant tenderness or worsening of abdominal pains  Swelling of the abdomen that is new, acute  Fever of 100F or higher   Following upper endoscopy (EGD)  Vomiting of blood or coffee ground material  New chest pain or pain under the shoulder blades  Painful or persistently difficult swallowing  New shortness of breath  Fever of 100F or higher  Black, tarry-looking stools  For urgent or emergent issues, a gastroenterologist can be reached at any hour by calling [phone]. Do not use MyChart messaging for urgent concerns.    DIET:  We do recommend a small meal at first, but  then you may proceed to your regular diet.  Drink plenty of fluids but you should avoid alcoholic beverages for 24 hours.  ACTIVITY:  You should plan to take it easy for the rest of today and you should NOT DRIVE or use heavy machinery until tomorrow (because of the sedation medicines used during the test).    FOLLOW UP: Our staff will call the number listed on your records 48-72 hours following your procedure to check on you and address any questions or concerns that you may have regarding the information given to you following your procedure. If we do not reach you, we will leave a message.  We will attempt to reach you two times.  During this call, we will ask if you have developed any symptoms of COVID 19. If you develop any symptoms (ie: fever, flu-like symptoms, shortness of breath, cough etc.) before then, please call [phone].  If you test positive for Covid 19 in the 2 weeks post procedure, please call and report this information to us.    If any biopsies were taken you will be contacted by phone or by letter within the next 1-3 weeks.  Please call us at [phone] if you have not heard about the biopsies in 3 weeks.    SIGNATURES/CONFIDENTIALITY: You and/or your care partner have signed paperwork which will be entered into your electronic medical record.  These signatures attest to the fact that that the information above on   your After Visit Summary has been reviewed and is understood.  Full responsibility of the confidentiality of this discharge information lies with you and/or your care-partner. 

## 2019-09-23 ENCOUNTER — Other Ambulatory Visit (HOSPITAL_COMMUNITY)
Admission: RE | Admit: 2019-09-23 | Discharge: 2019-09-23 | Disposition: A | Discharge status: Home / Self Care | Payer: 59 | Source: Ambulatory Visit | Attending: Adult Health | Admitting: Adult Health

## 2019-09-23 DIAGNOSIS — Z20822 Contact with and (suspected) exposure to covid-19: Secondary | ICD-10-CM | POA: Insufficient documentation

## 2019-09-23 DIAGNOSIS — Z01812 Encounter for preprocedural laboratory examination: Secondary | ICD-10-CM | POA: Insufficient documentation

## 2019-09-23 LAB — HELICOBACTER PYLORI SCREEN-BIOPSY: UREASE: NEGATIVE

## 2019-09-23 LAB — SARS CORONAVIRUS 2 (TAT 6-24 HRS): SARS Coronavirus 2: NEGATIVE

## 2019-09-24 ENCOUNTER — Telehealth: Payer: Self-pay | Admitting: *Deleted

## 2019-09-24 NOTE — Telephone Encounter (Signed)
°  Follow up Call-  Call back number 09/20/2019  Post procedure Call Back phone  # [phone]  Permission to leave phone message Yes  Some recent data might be hidden     Patient questions:  Do you have a fever, pain , or abdominal swelling? No. Pain Score  0 *  Have you tolerated food without any problems? Yes.    Have you been able to return to your normal activities? Yes.    Do you have any questions about your discharge instructions: Diet   No. Medications  No. Follow up visit  No.  Do you have questions or concerns about your Care? No.  Actions: * If pain score is 4 or above: No action needed, pain <4.  1. Have you developed a fever since your procedure? no  2.   Have you had an respiratory symptoms (SOB or cough) since your procedure? no  3.   Have you tested positive for COVID 19 since your procedure no  4.   Have you had any family members/close contacts diagnosed with the COVID 19 since your procedure?  no   If yes to any of these questions please route to Laverna Peace, RN and Charlett Lango, RN

## 2019-09-26 ENCOUNTER — Ambulatory Visit (INDEPENDENT_AMBULATORY_CARE_PROVIDER_SITE_OTHER): Payer: 59 | Admitting: Pulmonary Disease

## 2019-09-26 ENCOUNTER — Other Ambulatory Visit: Payer: Self-pay

## 2019-09-26 ENCOUNTER — Ambulatory Visit: Payer: 59 | Admitting: Adult Health

## 2019-09-26 DIAGNOSIS — I272 Pulmonary hypertension, unspecified: Secondary | ICD-10-CM

## 2019-09-26 NOTE — Progress Notes (Signed)
Full PFT performed today. °

## 2019-09-27 ENCOUNTER — Ambulatory Visit: Payer: 59 | Admitting: Adult Health

## 2019-09-30 ENCOUNTER — Ambulatory Visit: Payer: 59 | Admitting: Pulmonary Disease

## 2019-10-03 NOTE — Progress Notes (Signed)
Noted thanks  Arlys John

## 2019-10-08 ENCOUNTER — Encounter: Payer: Self-pay | Admitting: Pulmonary Disease

## 2019-10-08 ENCOUNTER — Other Ambulatory Visit: Payer: Self-pay

## 2019-10-08 ENCOUNTER — Ambulatory Visit (INDEPENDENT_AMBULATORY_CARE_PROVIDER_SITE_OTHER): Payer: 59 | Admitting: Pulmonary Disease

## 2019-10-08 DIAGNOSIS — I2699 Other pulmonary embolism without acute cor pulmonale: Secondary | ICD-10-CM

## 2019-10-08 DIAGNOSIS — I272 Pulmonary hypertension, unspecified: Secondary | ICD-10-CM

## 2019-10-08 DIAGNOSIS — D869 Sarcoidosis, unspecified: Secondary | ICD-10-CM

## 2019-10-08 DIAGNOSIS — D860 Sarcoidosis of lung: Secondary | ICD-10-CM

## 2019-10-08 DIAGNOSIS — G4733 Obstructive sleep apnea (adult) (pediatric): Secondary | ICD-10-CM

## 2019-10-08 DIAGNOSIS — J479 Bronchiectasis, uncomplicated: Secondary | ICD-10-CM | POA: Diagnosis not present

## 2019-10-08 NOTE — Assessment & Plan Note (Signed)
Repeat pulmonary function testing reviewed today 2017 pulmonary function testing reviewed today  Plan: We will continue to clinically monitor Patient feels that breathing is at stable baseline We will discuss with Dr. Craige Cotta if he would like to repeat CT imaging such as a high-resolution CT chest in 6 to 12 months

## 2019-10-08 NOTE — Patient Instructions (Addendum)
You were seen today by Hailey Ceo, NP  for:   1. Sarcoidosis 2. Sarcoidosis of lung (HCC)  Pulmonary function testing today shows that you have a little bit harder time taking a full deep breath in  We can consider repeating the CT of your chest in 6 to 12 months  We will see you back in office with Dr. Craige Cotta in 3 months  Notify our office if symptoms worsen  3. Bronchiectasis without complication (HCC)  Bronchiectasis: This is the medical term which indicates that you have damage, dilated airways making you more susceptible to respiratory infection. Use a flutter valve 10 breaths twice a day or 4 to 5 breaths 4-5 times a day to help clear mucus out Let us know if you have cough with change in mucus color or fevers or chills.  At that point you would need an antibiotic. Maintain a healthy nutritious diet, eating whole foods Take your medications as prescribed    4. Pulmonary hypertension (HCC)  Echocardiogram shows improvement with you starting CPAP therapy  5. OSA (obstructive sleep apnea)  We recommend that you continue using your CPAP daily >>>Keep up the hard work using your device >>> Goal should be wearing this for the entire night that you are sleeping, at least 4 to 6 hours  Remember:  . Do not drive or operate heavy machinery if tired or drowsy.  . Please notify the supply company and office if you are unable to use your device regularly due to missing supplies or machine being broken.  . Work on maintaining a healthy weight and following your recommended nutrition plan  . Maintain proper daily exercise and movement  . Maintaining proper use of your device can also help improve management of other chronic illnesses such as: Blood pressure, blood sugars, and weight management.   BiPAP/ CPAP Cleaning:  >>>Clean weekly, with Dawn soap, and bottle brush.  Set up to air dry. >>> Wipe mask out daily with wet wipe or towelette   6. Bilateral pulmonary embolism  (HCC)  Continue Xarelto  Contact our office if your OB/GYN is having difficulty managing menstrual cycles  Let us know what they follow-up plan is after OB/GYN speaks with hematology    Follow Up:    Return in about 3 months (around 01/08/2020), or if symptoms worsen or fail to improve, for Follow up with Dr. Craige Cotta.   Please do your part to reduce the spread of COVID-19:      Reduce your risk of any infection  and COVID19 by using the similar precautions used for avoiding the common cold or flu:  Marland Kitchen Wash your hands often with soap and warm water for at least 20 seconds.  If soap and water are not readily available, use an alcohol-based hand sanitizer with at least 60% alcohol.  . If coughing or sneezing, cover your mouth and nose by coughing or sneezing into the elbow areas of your shirt or coat, into a tissue or into your sleeve (not your hands). Drinda Butts A MASK when in public  . Avoid shaking hands with others and consider head nods or verbal greetings only. . Avoid touching your eyes, nose, or mouth with unwashed hands.  . Avoid close contact with people who are sick. . Avoid places or events with large numbers of people in one location, like concerts or sporting events. . If you have some symptoms but not all symptoms, continue to monitor at home and seek medical attention if  your symptoms worsen. . If you are having a medical emergency, call 911.   ADDITIONAL HEALTHCARE OPTIONS FOR PATIENTS  [facility] / e-Visit: [email or web address]         [facility]: [phone]  [facility]: [phone]                   [facility]: [phone]     It is flu season:   >>> Best ways to protect herself from the flu: Receive the yearly flu vaccine, practice good hand hygiene washing with soap and also using hand sanitizer when available, eat a nutritious meals, get adequate rest, hydrate  appropriately   Please contact the office if your symptoms worsen or you have concerns that you are not improving.   Thank you for choosing [facility] for your healthcare, and for allowing [place] to partner with you on your healthcare journey. I am thankful to be able to provide care to you today.   Elisha Headland FNP-C

## 2019-10-08 NOTE — Assessment & Plan Note (Signed)
Plan: Continue flutter valve Notify our office if you have increased sputum production or discolored sputum

## 2019-10-08 NOTE — Assessment & Plan Note (Signed)
Pulmonary hypertension has improved with CPAP use Likely WHO class I and III with clinical diagnosis of sarcoidosis as well as restrictive lung disease  Plan: Continue to clinically monitor Continue CPAP

## 2019-10-08 NOTE — Progress Notes (Signed)
Reviewed and agree with assessment/plan.   Coralyn Helling, MD [facility]/Critical [facility] 10/08/2019, 2:39 PM Pager:  [phone]

## 2019-10-08 NOTE — Progress Notes (Signed)
Virtual Visit via Telephone Note  I connected with Hailey Ross on 10/08/19 at  1:30 PM EDT by telephone and verified that I am speaking with the correct person using two identifiers.  Location: Patient: Home Provider: [facility] - [address], [place], [place], [place] [address]   I discussed the limitations, risks, security and privacy concerns of performing an evaluation and management service by telephone and the availability of in person appointments. I also discussed with the patient that there may be a patient responsible charge related to this service. The patient expressed understanding and agreed to proceed.  Patient consented to consult via telephone: Yes People present and their role in pt care: Pt     History of Present Illness:  49 year old female former smoker followed in our office for bronchiectasis and sarcoidosis  Past medical history: Diabetes, vitamin D deficiency, history of PE, chronic kidney disease, pulmonary hypertension, hyperlipidemia Smoking history: Former smoker.  Quit 2016.  20-pack-year smoking history. Maintenance: None Patient Dr. Halford Chessman   Chief complaint: Follow-up to review pulmonary function testing results   49 year old female former smoker followed in our office for bronchiectasis and sarcoidosis.  Patient complaining follow-up from her April/2021 office visit.  With TP NP.  Patient is also followed by Dr. Earney Mallet.  Plan of care from the April/2021 office visit was to remain on Xarelto.  Patient had an unprovoked PE in March/2021.  Patient was planning on getting a 2D echocardiogram done in June, patient was set up for CPAP and a pulmonary function test was ordered.  It was explained to the patient she would require lifelong anticoagulation.  Patient reports that she has been using CPAP therapy.  With no issues.  She already has noticed an improvement in her daytime fatigue as well as daytime sleepiness.  She feels that she is  adjusted.  Patient has already seen hematology Dr. Raliegh Ip at [facility] on 07/25/2019.  An excerpt of that assessment and plan as listed below:  ASSESSMENT & PLAN:   Bilateral pulmonary embolism ([place]) 1.  Unprovoked bilateral pulmonary embolism: -Presentation with right knee pain and swelling and on and off lightheadedness since January 2021.  Presented to the ER on 06/18/2019 with worsening lightheadedness and coughing up blood-streaked sputum. A CT angiogram on 06/18/2019 showed peripheral segmental and subsegmental bilateral pulmonary emboli with no right heart strain.  Bulky mediastinal and hilar lymph nodes and pulmonary findings consistent with history of sarcoidosis. -Bilateral leg Doppler was negative on the same day for DVT. -He is taking Xarelto without any bleeding issues.  She quit smoking 5 years ago and smoked 1 pack/day for 22 years. -We discussed results of blood tests.  Lupus anticoagulant was positive but could be false positive.  Anticardiolipin antibodies were negative.  Factor V Leiden and prothrombin gene mutation were negative. -She was recommended indefinite anticoagulation because of unprovoked nature.  I will check her back 4 months with D-dimer.  After that we will switch her to once a year visits.  2.  Sarcoidosis: -Diagnosed in January 2018, treated with prednisone for 1 and half year.  She is weaned off now and uses inhaler.  She follows up with Dr. Halford Chessman.  3.  Decreased right ear hearing: -She reports buzzing in the ears on and off and decreased right ear hearing.  She has used meclizine in the past without help. -We will make referral to Dr. Benjamine Mola.  Patient reports that she is tolerating the Xarelto well.  She  does struggle when she is having her menstrual cycles.  She has discussed this with OB/GYN.  Her OB/GYN is investigating different interventions today.  She feels that her breathing is actually doing better.  She only struggles with physical exertion.  Which has  been a known and chronic issue.  She does continue to use her flutter valve for management of her bronchiectasis.  She feels she is at her baseline of her mucus production. doing well    Observations/Objective:  Labs 04/21/15 >> HIV negative, Quantiferon gold negative, ACE 195, ANA, RF negative, ESR 32 PFT 05/26/15 >> FEV1 1.67 (66%), FEV1% 88, TLC 2/78 (53%), DLCO 36%  Chest imaging:  CT chest 03/18/15 >> biapical thickening with traction BTX, BTX RML and lingula, subcarinal LAN 1.7 cm, patchy increased interstitial markings HRCT chest 03/07/18 >> borderline LAN, patchy GGO, septal thickening, architectural distortion, cylindrical and varicose BTX  CT chest June 18, 2019 showed peripheral segmental and subsegmental bilateral pulmonary emboli, bulky mediastinal and hilar lymph nodes, bilateral apical and lingular honeycombing and bilateral upper lobe mild traction bronchiectasis.  Nonspecific groundglass opacities in the left base and right middle lobe.  Not significantly changed  09/26/2019-pulmonary function test-FVC 1.45 (47% predicted), postbronchodilator ratio 85, postbronchodilator FEV1 1.18 (48% predicted), no bronchodilator response, total lung capacity 3.17 (61% predicted), DLCO 9.96 (45% predicted)  09/13/2019-echocardiogram-LV ejection fraction 55 to 60%, right ventricular systolic function is still mildly reduced, mildly elevated pulmonary artery systolic pressure at 63.7, left atrial and right atrial sizes are mildly dilated  07/22/2019-home sleep study-AHI 9.5, SaO2 low 74%  06/19/2019-echocardiogram-LV ejection fraction 60 to 65%, right ventricular systolic function is mildly reduced, moderately elevated pulmonary artery systolic pressure  Social History   Tobacco Use  Smoking Status Former Smoker  . Packs/day: 1.00  . Years: 20.00  . Pack years: 20.00  . Types: Cigarettes  . Quit date: 03/21/2015  . Years since quitting: 4.5  Smokeless Tobacco Never Used    Immunization History  Administered Date(s) Administered  . Influenza,inj,Quad PF,6+ Mos 12/21/2018  . Influenza,inj,Quad PF,6-35 Mos 01/10/2019  . PFIZER SARS-COV-2 Vaccination 08/14/2019  . Pneumococcal Polysaccharide-23 02/12/2014  . Tdap 02/12/2014      Assessment and Plan:  Pulmonary hypertension ([place]) Pulmonary hypertension has improved with CPAP use Likely WHO class I and III with clinical diagnosis of sarcoidosis as well as restrictive lung disease  Plan: Continue to clinically monitor Continue CPAP  Bilateral pulmonary embolism ([place]) Plan: Continue Xarelto Patient is already seen hematology Recommending lifelong anticoagulation Patient's OB/GYN is discussing with hematology what the patient can do during her menstrual cycles where she is having significant bleeding.  Sarcoidosis of lung ([place]) Repeat pulmonary function testing reviewed today 2017 pulmonary function testing reviewed today  Plan: We will continue to clinically monitor Patient feels that breathing is at stable baseline We will discuss with Dr. Halford Chessman if he would like to repeat CT imaging such as a high-resolution CT chest in 6 to 12 months  OSA (obstructive sleep apnea) Plan: Continue CPAP therapy  Bronchiectasis ([place]) Plan: Continue flutter valve Notify our office if you have increased sputum production or discolored sputum  Sarcoidosis Clinical diagnosis as well as radiologic findings consistent with sarcoidosis Elevated ACE level in the past No biopsy showing granulomas 2018 patient received prednisone for 18 months Patient is currently been off of prednisone since that extended taper Patient feels that her breathing is stable if not improved Pulmonary function testing reviewed today shows slightly increased restriction Stable diffusion defect  Plan: We will continue clinically monitor Can consider repeat CT imaging in 6 to 12 months would prefer or favor a high-resolution CT chest  to compare to 2017 to see if there is been progression We will review case with Dr. Halford Chessman    Follow Up Instructions:  Return in about 3 months (around 01/08/2020), or if symptoms worsen or fail to improve, for Follow up with Dr. Halford Chessman.   I discussed the assessment and treatment plan with the patient. The patient was provided an opportunity to ask questions and all were answered. The patient agreed with the plan and demonstrated an understanding of the instructions.   The patient was advised to call back or seek an in-person evaluation if the symptoms worsen or if the condition fails to improve as anticipated.  I provided 26 minutes of non-face-to-face time during this encounter.   Lauraine Rinne, NP

## 2019-10-08 NOTE — Assessment & Plan Note (Signed)
Plan: Continue Xarelto Patient is already seen hematology Recommending lifelong anticoagulation Patient's OB/GYN is discussing with hematology what the patient can do during her menstrual cycles where she is having significant bleeding.

## 2019-10-08 NOTE — Assessment & Plan Note (Signed)
Clinical diagnosis as well as radiologic findings consistent with sarcoidosis Elevated ACE level in the past No biopsy showing granulomas 2018 patient received prednisone for 18 months Patient is currently been off of prednisone since that extended taper Patient feels that her breathing is stable if not improved Pulmonary function testing reviewed today shows slightly increased restriction Stable diffusion defect  Plan: We will continue clinically monitor Can consider repeat CT imaging in 6 to 12 months would prefer or favor a high-resolution CT chest to compare to 2017 to see if there is been progression We will review case with Dr. Craige Cotta

## 2019-10-08 NOTE — Assessment & Plan Note (Signed)
Plan: Continue CPAP therapy 

## 2019-10-09 ENCOUNTER — Telehealth: Payer: Self-pay | Admitting: Cardiology

## 2019-10-09 NOTE — Telephone Encounter (Signed)
  Patient Consent for Virtual Visit         Hailey Ross has provided verbal consent on 10/09/2019 for a virtual visit (video or telephone).   CONSENT FOR VIRTUAL VISIT FOR:  Hailey Ross  By participating in this virtual visit I agree to the following:  I hereby voluntarily request, consent and authorize [facility] and its employed or contracted physicians, physician assistants, nurse practitioners or other licensed health care professionals (the Practitioner), to provide me with telemedicine health care services (the "Services") as deemed necessary by the treating Practitioner. I acknowledge and consent to receive the Services by the Practitioner via telemedicine. I understand that the telemedicine visit will involve communicating with the Practitioner through live audiovisual communication technology and the disclosure of certain medical information by electronic transmission. I acknowledge that I have been given the opportunity to request an in-person assessment or other available alternative prior to the telemedicine visit and am voluntarily participating in the telemedicine visit.  I understand that I have the right to withhold or withdraw my consent to the use of telemedicine in the course of my care at any time, without affecting my right to future care or treatment, and that the Practitioner or I may terminate the telemedicine visit at any time. I understand that I have the right to inspect all information obtained and/or recorded in the course of the telemedicine visit and may receive copies of available information for a reasonable fee.  I understand that some of the potential risks of receiving the Services via telemedicine include:  Marland Kitchen Delay or interruption in medical evaluation due to technological equipment failure or disruption; . Information transmitted may not be sufficient (e.g. poor resolution of images) to allow for appropriate medical decision making by the  Practitioner; and/or  . In rare instances, security protocols could fail, causing a breach of personal health information.  Furthermore, I acknowledge that it is my responsibility to provide information about my medical history, conditions and care that is complete and accurate to the best of my ability. I acknowledge that Practitioner's advice, recommendations, and/or decision may be based on factors not within their control, such as incomplete or inaccurate data provided by me or distortions of diagnostic images or specimens that may result from electronic transmissions. I understand that the practice of medicine is not an exact science and that Practitioner makes no warranties or guarantees regarding treatment outcomes. I acknowledge that a copy of this consent can be made available to me via my patient portal [facility] MyChart), or I can request a printed copy by calling the office of [facility].    I understand that my insurance will be billed for this visit.   I have read or had this consent read to me. . I understand the contents of this consent, which adequately explains the benefits and risks of the Services being provided via telemedicine.  . I have been provided ample opportunity to ask questions regarding this consent and the Services and have had my questions answered to my satisfaction. . I give my informed consent for the services to be provided through the use of telemedicine in my medical care

## 2019-10-11 ENCOUNTER — Ambulatory Visit: Payer: 59 | Admitting: Family Medicine

## 2019-10-15 ENCOUNTER — Other Ambulatory Visit: Payer: Self-pay

## 2019-10-15 ENCOUNTER — Encounter: Payer: Self-pay | Admitting: Family Medicine

## 2019-10-15 ENCOUNTER — Encounter: Payer: 59 | Admitting: Internal Medicine

## 2019-10-15 ENCOUNTER — Telehealth (INDEPENDENT_AMBULATORY_CARE_PROVIDER_SITE_OTHER): Payer: 59 | Admitting: Family Medicine

## 2019-10-15 ENCOUNTER — Ambulatory Visit: Payer: 59 | Admitting: Family Medicine

## 2019-10-15 VITALS — BP 122/77 | Ht 65.0 in | Wt 211.0 lb

## 2019-10-15 DIAGNOSIS — R42 Dizziness and giddiness: Secondary | ICD-10-CM | POA: Diagnosis not present

## 2019-10-15 DIAGNOSIS — N920 Excessive and frequent menstruation with regular cycle: Secondary | ICD-10-CM | POA: Diagnosis not present

## 2019-10-15 DIAGNOSIS — I10 Essential (primary) hypertension: Secondary | ICD-10-CM

## 2019-10-15 NOTE — Assessment & Plan Note (Signed)
Hailey Ross is encouraged to maintain a well balanced diet that is low in salt. Controlled, continue current medication regimen.   Additionally, she is also reminded that exercise is beneficial for heart health and control of  Blood pressure. 30-60 minutes daily is recommended-walking was suggested.

## 2019-10-15 NOTE — Assessment & Plan Note (Signed)
Continues to have heavy cycles on blood thinners due to PE. Looking to possibly have an ablation but unable to do that at this time secondary to blood thinners. Is following with hematology.  Has had iron deficiency anemia.  Was getting transfused.  Currently not taking any transfusions or oral iron at this time.

## 2019-10-15 NOTE — Patient Instructions (Signed)
I appreciate the opportunity to provide you with care for your health and wellness. Today we discussed:   Follow up:   No labs or referrals today  Please continue to practice social distancing to keep you, your family, and our community safe.  If you must go out, please wear a mask and practice good handwashing.  It was a pleasure to see you and I look forward to continuing to work together on your health and well-being. Please do not hesitate to call the office if you need care or have questions about your care.  Have a wonderful day and week. With Gratitude, Leory Allinson, DNP, AGNP-BC      

## 2019-10-15 NOTE — Assessment & Plan Note (Signed)
Lightheadedness was reported earlier in the year.  All work-ups have been pretty much unremarkable.  She has a referral to cardiology upcoming.  She did wear Holter monitor for 30 days.  She reports that she thinks that her lightheadedness might be related to her cycles.  She does have particularly heavy bleeding with her cycles.  But is on Xarelto secondary to PE.  Dr. Cherly Hensen reported that she might build to do an ablation however with the Xarelto she would be unable to do it at this time.  So Dr. Cherly Hensen and is going to be reaching out to hematology.

## 2019-10-15 NOTE — Progress Notes (Signed)
Virtual Visit via Telephone Note   This visit type was conducted due to national recommendations for restrictions regarding the COVID-19 Pandemic (e.g. social distancing) in an effort to limit this patient's exposure and mitigate transmission in our community.  Due to her co-morbid illnesses, this patient is at least at moderate risk for complications without adequate follow up.  This format is felt to be most appropriate for this patient at this time.  The patient did not have access to video technology/had technical difficulties with video requiring transitioning to audio format only (telephone).  All issues noted in this document were discussed and addressed.  No physical exam could be performed with this format.   Evaluation Performed:  Follow-up visit  Date:  10/15/2019   ID:  Hailey Ross, DOB 06-30-1970, MRN [id number]  Patient Location: Home Provider Location: Office  Location of Patient: Home Location of Provider: Telehealth Consent was obtain for visit to be over via telehealth. I verified that I am speaking with the correct person using two identifiers.  PCP:  Freddy Finner, NP   Chief Complaint: Ongoing lightheadedness.  History of Present Illness:    Hailey Ross is a 49 y.o. female with significant history of PE, iron deficiency, CKD, sarcoidosis, sleep apnea on CPAP.  More recently has struggled with lightheadedness.  Has been worked up by pulmonary most everything looks unremarkable and doing well in that situation.  Has just finished wearing a Holter monitor for 30 days.  Has follow-up with Dr. Wyline Mood on the 16th of this month.  Additionally has followed up with Dr. Cherly Hensen her GYN.  Questionable whether or not she needs an ablation secondary to the lightheadedness possibly being related to her cycles.  She does have heavy bleeding secondary to being on Xarelto.  Hematology was given her IV transfusions those have stopped since then.  Her most recent labs  demonstrated normal range.  So she will be working with hematology and GYN to help situate what can be done to help her avoid the lightheadedness if it is cycle related.  She denies having any chest pain, leg swelling, palpitations, cough, new shortness of breath, fevers, chills, headaches, vision changes.   The patient does not have symptoms concerning for COVID-19 infection (fever, chills, cough, or new shortness of breath).   Past Medical, Surgical, Social History, Allergies, and Medications have been Reviewed.  Past Medical History:  Diagnosis Date  . Acute cholecystitis 08/24/2016  . Acute kidney injury superimposed on chronic kidney disease (HCC) 06/20/2019  . Acute respiratory failure with hypoxia (HCC) 06/19/2019  . Annual visit for general adult medical examination with abnormal findings 04/16/2019  . Arthritis    right knee  . Bronchiectasis (HCC)   . CKD (chronic kidney disease)   . Iron deficiency   . Lightheadedness 05/10/2019  . PE (pulmonary thromboembolism) (HCC) 06/2019  . Pre-syncope 04/16/2019  . Sarcoidosis   . Sarcoidosis   . Sleep apnea    c pap  . Smoker 02/12/2014   Past Surgical History:  Procedure Laterality Date  . CHOLECYSTECTOMY N/A 08/25/2016   Procedure: LAPAROSCOPIC CHOLECYSTECTOMY;  Surgeon: Abigail Miyamoto, MD;  Location: [facility] OR;  Service: General;  Laterality: N/A;  . TUBAL LIGATION  02/1993     Current Meds  Medication Sig  . acetaminophen (TYLENOL) 500 MG tablet Take 500 mg by mouth every 4 (four) hours as needed for mild pain, moderate pain or headache.   . albuterol (PROAIR HFA) 108 (90  Base) MCG/ACT inhaler Inhale 2 puffs into the lungs every 6 (six) hours as needed for wheezing or shortness of breath.  Marland Kitchen albuterol (PROVENTIL) (2.5 MG/3ML) 0.083% nebulizer solution Take 3 mLs (2.5 mg total) by nebulization every 6 (six) hours as needed for wheezing or shortness of breath.  . lisinopril (ZESTRIL) 2.5 MG tablet Take 2.5 mg by mouth daily.  .  pantoprazole (PROTONIX) 40 MG tablet Take 1 tablet (40 mg total) by mouth daily.  . rivaroxaban (XARELTO) 20 MG TABS tablet Take 1 tablet (20 mg total) by mouth daily with supper.  . rosuvastatin (CRESTOR) 5 MG tablet Take 1 tablet (5 mg total) by mouth daily.  . Vitamin D, Ergocalciferol, (DRISDOL) 1.25 MG (50000 UNIT) CAPS capsule Take 1 capsule (50,000 Units total) by mouth every 7 (seven) days.     Allergies:   Patient has no known allergies.   ROS:   Please see the history of present illness.    All other systems reviewed and are negative.   Labs/Other Tests and Data Reviewed:    Recent Labs: 04/22/2019: TSH 1.71 08/21/2019: ALT 18 09/15/2019: BUN 15; Creatinine, Ser 1.23; Hemoglobin 12.7; Platelets 364; Potassium 3.8; Sodium 138   Recent Lipid Panel Lab Results  Component Value Date/Time   CHOL 230 (H) 04/22/2019 08:39 AM   TRIG 93 04/22/2019 08:39 AM   HDL 58 04/22/2019 08:39 AM   CHOLHDL 4.0 04/22/2019 08:39 AM   LDLCALC 152 (H) 04/22/2019 08:39 AM    Wt Readings from Last 3 Encounters:  10/15/19 211 lb (95.7 kg)  09/20/19 211 lb (95.7 kg)  09/18/19 211 lb 9.6 oz (96 kg)     Objective:    Vital Signs:  BP 122/77   Ht 5\' 5"  (1.651 m)   Wt 211 lb (95.7 kg)   BMI 35.11 kg/m    VITAL SIGNS:  reviewed GEN:  alert and oriented RESPIRATORY:  no shortness of breath in conversation  PSYCH:  normal affect and mood   ASSESSMENT & PLAN:    1. Essential hypertension  2. Lightheadedness  3. Menorrhagia with regular cycle   Time:   Today, I have spent 10 minutes with the patient with telehealth technology discussing the above problems.     Medication Adjustments/Labs and Tests Ordered: Current medicines are reviewed at length with the patient today.  Concerns regarding medicines are outlined above.   Tests Ordered: No orders of the defined types were placed in this encounter.   Medication Changes: No orders of the defined types were placed in this  encounter.   Disposition:  Follow up 3 months Signed, , NP  10/15/2019 3:45 PM     12/16/2019 [facility]

## 2019-10-16 ENCOUNTER — Ambulatory Visit: Payer: 59 | Admitting: Gastroenterology

## 2019-10-17 ENCOUNTER — Telehealth: Payer: Self-pay | Admitting: *Deleted

## 2019-10-17 NOTE — Telephone Encounter (Signed)
Pt aware and says she is still lightheaded no worse no better - would like to try a medication to help with symptoms

## 2019-10-17 NOTE — Telephone Encounter (Signed)
-----   Message from Antoine Poche, MD sent at 10/15/2019 10:28 AM EDT ----- Normal heart monitor, no significant arrhyhtmias. How are her symptoms doing? If still ongoign we can try a medication to see if it helps. Let me know how symptoms are  Hailey Ferry MD

## 2019-10-17 NOTE — Telephone Encounter (Signed)
Can we ask if she is still specifically having palpitations, if so would try diltiazem 30mg  bid   MD

## 2019-10-18 MED ORDER — DILTIAZEM HCL 30 MG PO TABS
30.0000 mg | ORAL_TABLET | Freq: Two times a day (BID) | ORAL | 3 refills | Status: DC
Start: 2019-10-18 — End: 2020-02-17

## 2019-10-18 NOTE — Telephone Encounter (Signed)
Pt still c/o palpitations occasionally every few days - will try dilt - Medication sent to pharmacy.

## 2019-10-21 ENCOUNTER — Telehealth: Payer: Self-pay | Admitting: Pulmonary Disease

## 2019-10-21 NOTE — Telephone Encounter (Signed)
Pt returning a phone call. Pt can be reached at [phone].

## 2019-10-21 NOTE — Telephone Encounter (Signed)
10/21/2019  We have received patient CPAP download. See those for reports listed below:  09/08/2019-10/07/2019-CPAP compliance report-15 out of last 30 days used, 8 of those days greater than 4 hours, average usage 3 hours and 55 minutes, APAP setting 5-15, 95th percentile 10.3, AHI 0.9  Please contact the patient and let her know that CPAP compliance is poor. We need to get the patient scheduled with Dr. Craige Cotta or an APP to further review. This should be an in office visit. Please also let the patient know that when she is using her CPAP it is adequately treating her sleep apnea as her AHI is well-controlled at 0.9.  If only available slot is with an APP. Please go ahead and also schedule or place recall for patient to be set up with Dr. Craige Cotta in 2 to 3 months.  Hailey Headland, FNP

## 2019-10-21 NOTE — Telephone Encounter (Signed)
Attempted to call pt but unable to reach. Left message for her to return call. 

## 2019-10-21 NOTE — Telephone Encounter (Signed)
Called and spoke with Patient.  Arlys John NP's recommendations given.  Understanding stated.  Patient scheduled 11/05/19 at 1530, with Brian,NP.  Patient has OV 01/08/20, with Dr Craige Cotta, at 1100.  Patient advised to bring SD card to OV.

## 2019-10-25 ENCOUNTER — Telehealth (INDEPENDENT_AMBULATORY_CARE_PROVIDER_SITE_OTHER): Payer: 59 | Admitting: Cardiology

## 2019-10-25 ENCOUNTER — Encounter: Payer: Self-pay | Admitting: Cardiology

## 2019-10-25 VITALS — BP 132/80 | HR 67 | Ht 65.0 in | Wt 211.0 lb

## 2019-10-25 DIAGNOSIS — R42 Dizziness and giddiness: Secondary | ICD-10-CM

## 2019-10-25 DIAGNOSIS — R002 Palpitations: Secondary | ICD-10-CM

## 2019-10-25 NOTE — Patient Instructions (Signed)

## 2019-10-25 NOTE — Progress Notes (Signed)
Virtual Visit via Telephone Note   This visit type was conducted due to national recommendations for restrictions regarding the COVID-19 Pandemic (e.g. social distancing) in an effort to limit this patient's exposure and mitigate transmission in our community.  Due to her co-morbid illnesses, this patient is at least at moderate risk for complications without adequate follow up.  This format is felt to be most appropriate for this patient at this time.  The patient did not have access to video technology/had technical difficulties with video requiring transitioning to audio format only (telephone).  All issues noted in this document were discussed and addressed.  No physical exam could be performed with this format.  Please refer to the patient's chart for her  consent to telehealth for [facility]   The patient was identified using 2 identifiers.  Date:  10/25/2019   ID:  Hailey Ross, DOB 16-Jul-1970, MRN [id number]  Patient Location: Home Provider Location: Office/Clinic  PCP:  Freddy Finner, NP  Cardiologist:  Dina Rich, MD  Electrophysiologist:  None   Evaluation Performed:  Follow-Up Visit  Chief Complaint:  Follow up  History of Present Illness:    Hailey Ross is a 48 y.o. female seen today as a new patient for the following medical problems. This is a focused visit for history of palpitations.     1. Palpitations - ER visit 08/17/19 with tachycardia and dizziness. Reported episode of heart racing to 140s while driving. Prior feeling on uneasiness, followed by sudden palpitations. Checked HR on watch and was 144. Episodes lasted about 5 minutes - K 3.8 Cr 1.16 WBC 12 Hgb 12.8 Plt 444 hstrop 18-->17  Ddimer 0.49   - 2 total episodes of palpitations. - first episode in 07/2019 while sleeping, awoke her from sleep.  - feeling of heart racing. Walked around for a few minutes, did not resolve.  - came to ER 07/15/19, negative workup including CT PE -  episode lasted about 15 minutes in total  - notes on her watch HRs up and down in general - can have minor palpitations at times.   - previously was drinking coffee x 1 day, sodas pepsi cans x 2, sweet tea glasses x 2-3, no energy drinks, no EtOH   - 08/2019 30 day monitor, no significant arrhythmias - started on dilt 30mg  bid for symptoms rare palpitations at times, seem to be associated with her cycle. Dizziness seems to correlate with her cycle as well, dizziness not associated with palpitations.        The patient does not have symptoms concerning for COVID-19 infection (fever, chills, cough, or new shortness of breath).    Past Medical History:  Diagnosis Date  . Acute cholecystitis 08/24/2016  . Acute kidney injury superimposed on chronic kidney disease (HCC) 06/20/2019  . Acute respiratory failure with hypoxia (HCC) 06/19/2019  . Annual visit for general adult medical examination with abnormal findings 04/16/2019  . Arthritis    right knee  . Bronchiectasis (HCC)   . CKD (chronic kidney disease)   . Iron deficiency   . Lightheadedness 05/10/2019  . PE (pulmonary thromboembolism) (HCC) 06/2019  . Pre-syncope 04/16/2019  . Sarcoidosis   . Sarcoidosis   . Sleep apnea    c pap  . Smoker 02/12/2014   Past Surgical History:  Procedure Laterality Date  . CHOLECYSTECTOMY N/A 08/25/2016   Procedure: LAPAROSCOPIC CHOLECYSTECTOMY;  Surgeon: 08/27/2016, MD;  Location: [facility] OR;  Service: General;  Laterality: N/A;  .  TUBAL LIGATION  02/1993     Current Meds  Medication Sig  . acetaminophen (TYLENOL) 500 MG tablet Take 500 mg by mouth every 4 (four) hours as needed for mild pain, moderate pain or headache.   . albuterol (PROAIR HFA) 108 (90 Base) MCG/ACT inhaler Inhale 2 puffs into the lungs every 6 (six) hours as needed for wheezing or shortness of breath.  Marland Kitchen albuterol (PROVENTIL) (2.5 MG/3ML) 0.083% nebulizer solution Take 3 mLs (2.5 mg total) by nebulization every 6  (six) hours as needed for wheezing or shortness of breath.  . diltiazem (CARDIZEM) 30 MG tablet Take 1 tablet (30 mg total) by mouth 2 (two) times daily.  Marland Kitchen lisinopril (ZESTRIL) 2.5 MG tablet Take 2.5 mg by mouth daily.  . pantoprazole (PROTONIX) 40 MG tablet Take 1 tablet (40 mg total) by mouth daily.  . rivaroxaban (XARELTO) 20 MG TABS tablet Take 1 tablet (20 mg total) by mouth daily with supper.  . rosuvastatin (CRESTOR) 5 MG tablet Take 1 tablet (5 mg total) by mouth daily.  . Vitamin D, Ergocalciferol, (DRISDOL) 1.25 MG (50000 UNIT) CAPS capsule Take 1 capsule (50,000 Units total) by mouth every 7 (seven) days.     Allergies:   Patient has no known allergies.   Social History   Tobacco Use  . Smoking status: Former Smoker    Packs/day: 1.00    Years: 20.00    Pack years: 20.00    Types: Cigarettes    Quit date: 03/21/2015    Years since quitting: 4.6  . Smokeless tobacco: Never Used  Vaping Use  . Vaping Use: Never used  Substance Use Topics  . Alcohol use: Yes    Alcohol/week: 0.0 standard drinks    Comment: Rare  . Drug use: No     Family Hx: The patient's family history includes Cancer in her mother; Colon polyps in her mother; Hypertension in her mother. There is no history of Colon cancer, Esophageal cancer, Rectal cancer, or Stomach cancer.  ROS:   Please see the history of present illness.     All other systems reviewed and are negative.   Prior CV studies:   The following studies were reviewed today:  08/2019 event monitor  30 day event monitor  Min HR 47, Max HR 135, Avg HR 70. Min HR occurred in early AM hours presumably while sleeping  Symptoms correlate with sinus rhythm  No significant arrhythmias  09/2019 echo IMPRESSIONS    1. Left ventricular ejection fraction, by estimation, is 55 to 60%. The  left ventricle has normal function. The left ventricle has no regional  wall motion abnormalities. Left ventricular diastolic parameters were    normal.  2. Right ventricular systolic function is mildly reduced. The right  ventricular size is moderately enlarged. There is mildly elevated  pulmonary artery systolic pressure. The estimated right ventricular  systolic pressure is 35.3 mmHg.  3. Left atrial size was mildly dilated.  4. Right atrial size was mildly dilated.  5. The mitral valve is normal in structure. No evidence of mitral valve  regurgitation. No evidence of mitral stenosis.  6. Tricuspid valve regurgitation is mild to moderate.  7. The aortic valve is normal in structure. Aortic valve regurgitation is  not visualized. No aortic stenosis is present.  8. The inferior vena cava is normal in size with greater than 50%  respiratory variability, suggesting right atrial pressure of 3 mmHg.     Labs/Other Tests and Data Reviewed:  EKG:  No ECG reviewed.  Recent Labs: 04/22/2019: TSH 1.71 08/21/2019: ALT 18 09/15/2019: BUN 15; Creatinine, Ser 1.23; Hemoglobin 12.7; Platelets 364; Potassium 3.8; Sodium 138   Recent Lipid Panel Lab Results  Component Value Date/Time   CHOL 230 (H) 04/22/2019 08:39 AM   TRIG 93 04/22/2019 08:39 AM   HDL 58 04/22/2019 08:39 AM   CHOLHDL 4.0 04/22/2019 08:39 AM   LDLCALC 152 (H) 04/22/2019 08:39 AM    Wt Readings from Last 3 Encounters:  10/25/19 211 lb (95.7 kg)  10/15/19 211 lb (95.7 kg)  09/20/19 211 lb (95.7 kg)     Objective:    Vital Signs:  BP 132/80   Pulse 67   Ht 5\' 5"  (1.651 m)   Wt 211 lb (95.7 kg)   BMI 35.11 kg/m    Normal affect. Normal speech pattern and tone. Comfortable, no apparent distress. No audible signs of sob or wheezing.   ASSESSMENT & PLAN:    1. Palpitations -benign 30 day monitor - trial of diltiazem 30mg  bid for symptoms of palpitations. Unclear cause of her dizziness as it is not specifically associated with palpitaitons and benign cardiac monitor. Defer further workup to pcp regarding dizziness as no evidence of cardiac cause.  Orthostatics during 09/2019 ER visit were negative.      COVID-19 Education: The signs and symptoms of COVID-19 were discussed with the patient and how to seek care for testing (follow up with PCP or arrange E-visit).  The importance of social distancing was discussed today.  Time:   Today, I have spent 13 minutes with the patient with telehealth technology discussing the above problems.     Medication Adjustments/Labs and Tests Ordered: Current medicines are reviewed at length with the patient today.  Concerns regarding medicines are outlined above.   Tests Ordered: No orders of the defined types were placed in this encounter.   Medication Changes: No orders of the defined types were placed in this encounter.   Follow Up:  In Person in 6 month(s)  Signed, , MD  10/25/2019 9:41 AM    [facility]

## 2019-10-30 ENCOUNTER — Telehealth: Payer: Self-pay | Admitting: Internal Medicine

## 2019-10-30 ENCOUNTER — Telehealth (HOSPITAL_COMMUNITY): Payer: Self-pay | Admitting: *Deleted

## 2019-10-30 NOTE — Telephone Encounter (Signed)
Pt called clinic stating that she has been experiencing heavy bleeding and prolonged periods since starting the Xarelto. She states that she has been to her OB-GYN and an [place] and pap smear were done which showed no problems or cause to the irregular bleeding. She states that she has also had a colonoscopy and EGD which were also good.   I spoke with Dr. Ellin Saba about above issue and he advised that the pt should continue on Xarelto therapy and that she needed to go back to see her OB-GYN for the irregular bleeding that she is experiencing.   I advised the pt of the above and she stated that she had already made a follow up appointment with her OB-GYN to discuss an ablation to fix the irregular bleeding.

## 2019-10-30 NOTE — Telephone Encounter (Signed)
Pt is experiencing vaginal bleeding that she would like to discuss with the nurse.

## 2019-10-30 NOTE — Telephone Encounter (Signed)
Pt was not sure who to talk to about her irregular cycle. Discussed with her that would be her GYN, she has contacted that office also.

## 2019-11-05 ENCOUNTER — Ambulatory Visit (INDEPENDENT_AMBULATORY_CARE_PROVIDER_SITE_OTHER): Payer: 59 | Admitting: Pulmonary Disease

## 2019-11-05 ENCOUNTER — Other Ambulatory Visit: Payer: Self-pay

## 2019-11-05 ENCOUNTER — Encounter: Payer: Self-pay | Admitting: Pulmonary Disease

## 2019-11-05 VITALS — BP 116/74 | HR 69 | Ht 65.0 in | Wt 212.2 lb

## 2019-11-05 DIAGNOSIS — G4733 Obstructive sleep apnea (adult) (pediatric): Secondary | ICD-10-CM

## 2019-11-05 DIAGNOSIS — D860 Sarcoidosis of lung: Secondary | ICD-10-CM

## 2019-11-05 DIAGNOSIS — J479 Bronchiectasis, uncomplicated: Secondary | ICD-10-CM

## 2019-11-05 NOTE — Progress Notes (Signed)
_0  ID: Hailey Ross, female    DOB: 1970/05/31, 49 y.o.   MRN: [id number]  Chief Complaint  Patient presents with  . Follow-up    Pt is here to review cpap download. Pt states that she believes she is not getting a good signal from the cpap but other than that she feels like the cpap is working well for her. DME: [employer]    Referring provider: Perlie Mayo, NP  HPI:  49 year old female former smoker followed in our office for bronchiectasis and sarcoidosis  Past medical history: Diabetes, vitamin D deficiency, history of PE, chronic kidney disease, pulmonary hypertension, hyperlipidemia Smoking history: Former smoker.  Quit 2016.  20-pack-year smoking history. Maintenance: None Patient Dr. Halford Chessman  11/05/2019  - Visit   49 year old female former smoker followed in our office for bronchiectasis and sarcoidosis.  She is followed by Dr. Halford Chessman.  Patient complaining follow-up with our office since June/2021.  Plan of care at that office visit was or we could consider repeating CT chest imaging in 6 to 12 months compared to 2017.  It was recommended the patient continue CPAP therapy as well as continue flutter valve.  Patient presenting to our office for follow-up today.  CPAP compliance report listed below:  10/05/2019-11/03/2019-22 at a last 30 days use, 19 of those days greater than 4 hours, average usage 5 hours and 44 minutes, APAP setting 5-15, AHI 0.4  Patient reports adherence using CPAP therapy. She reports that she continues to have issues with Internet connection on her CPAP machine. She reports that sometimes she is using her CPAP and she takes it off to check her compliance on her app and there is no connectivity and no data. She feels that some of these recordings on her most recent CPAP compliance report that we have reviewed reflect this. We will discuss this today.  Questionaires / Pulmonary Flowsheets:   ACT:  No flowsheet data found.  MMRC: No flowsheet data  found.  Epworth:  No flowsheet data found.  Tests:   Labs 04/21/15 >> HIV negative, Quantiferon gold negative, ACE 195, ANA, RF negative, ESR 32 PFT 05/26/15 >> FEV1 1.67 (66%), FEV1% 88, TLC 2/78 (53%), DLCO 36%  Chest imaging:  CT chest 03/18/15 >> biapical thickening with traction BTX, BTX RML and lingula, subcarinal LAN 1.7 cm, patchy increased interstitial markings HRCT chest 03/07/18 >> borderline LAN, patchy GGO, septal thickening, architectural distortion, cylindrical and varicose BTX  CT chest June 18, 2019 showed peripheral segmental and subsegmental bilateral pulmonary emboli, bulky mediastinal and hilar lymph nodes, bilateral apical and lingular honeycombing and bilateral upper lobe mild traction bronchiectasis.  Nonspecific groundglass opacities in the left base and right middle lobe.  Not significantly changed  09/26/2019-pulmonary function test-FVC 1.45 (47% predicted), postbronchodilator ratio 85, postbronchodilator FEV1 1.18 (48% predicted), no bronchodilator response, total lung capacity 3.17 (61% predicted), DLCO 9.96 (45% predicted)  09/13/2019-echocardiogram-LV ejection fraction 55 to 60%, right ventricular systolic function is still mildly reduced, mildly elevated pulmonary artery systolic pressure at 64.4, left atrial and right atrial sizes are mildly dilated  07/22/2019-home sleep study-AHI 9.5, SaO2 low 74%  06/19/2019-echocardiogram-LV ejection fraction 60 to 65%, right ventricular systolic function is mildly reduced, moderately elevated pulmonary artery systolic pressure  FENO:  No results found for: NITRICOXIDE  PFT: PFT Results Latest Ref Rng & Units 09/26/2019 05/26/2015  FVC-Pre L - 1.78  FVC-Predicted Pre % 47 57  FVC-Post L 1.39 1.88  FVC-Predicted Post % 45 60  Pre FEV1/FVC % %  91 85  Post FEV1/FCV % % 85 88  FEV1-Pre L 1.33 1.51  FEV1-Predicted Pre % 54 59  FEV1-Post L 1.18 1.67  DLCO UNC% % 45 36  DLCO COR %Predicted % 105 73  TLC L 3.17 2.78  TLC  % Predicted % 61 53  RV % Predicted % 89 44    WALK:  No flowsheet data found.  Imaging: No results found.  Lab Results:  CBC    Component Value Date/Time   WBC 12.4 (H) 09/15/2019 1317   RBC 4.71 09/15/2019 1317   HGB 12.7 09/15/2019 1317   HCT 41.6 09/15/2019 1317   PLT 364 09/15/2019 1317   MCV 88.3 09/15/2019 1317   MCH 27.0 09/15/2019 1317   MCHC 30.5 09/15/2019 1317   RDW 13.6 09/15/2019 1317   LYMPHSABS 3.7 07/12/2019 1404   MONOABS 0.7 07/12/2019 1404   EOSABS 0.1 07/12/2019 1404   BASOSABS 0.1 07/12/2019 1404    BMET    Component Value Date/Time   NA 138 09/15/2019 1317   K 3.8 09/15/2019 1317   CL 105 09/15/2019 1317   CO2 24 09/15/2019 1317   GLUCOSE 120 (H) 09/15/2019 1317   BUN 15 09/15/2019 1317   CREATININE 1.23 (H) 09/15/2019 1317   CREATININE 1.36 (H) 07/04/2019 1355   CALCIUM 9.2 09/15/2019 1317   GFRNONAA 51 (L) 09/15/2019 1317   GFRNONAA 46 (L) 07/04/2019 1355   GFRAA 60 (L) 09/15/2019 1317   GFRAA 53 (L) 07/04/2019 1355    BNP No results found for: BNP  ProBNP No results found for: PROBNP  Specialty Problems      Pulmonary Problems   Bronchiectasis (HCC)   Sarcoidosis of lung (HCC)   OSA (obstructive sleep apnea)      No Known Allergies  Immunization History  Administered Date(s) Administered  . Influenza,inj,Quad PF,6+ Mos 12/21/2018  . Influenza,inj,Quad PF,6-35 Mos 01/10/2019  . PFIZER SARS-COV-2 Vaccination 08/14/2019, 09/10/2019  . Pneumococcal Polysaccharide-23 02/12/2014  . Tdap 02/12/2014    Past Medical History:  Diagnosis Date  . Acute cholecystitis 08/24/2016  . Acute kidney injury superimposed on chronic kidney disease ([place]) 06/20/2019  . Acute respiratory failure with hypoxia ([place]) 06/19/2019  . Annual visit for general adult medical examination with abnormal findings 04/16/2019  . Arthritis    right knee  . Bronchiectasis ([place])   . CKD (chronic kidney disease)   . Iron deficiency   . Lightheadedness  05/10/2019  . PE (pulmonary thromboembolism) ([place]) 06/2019  . Pre-syncope 04/16/2019  . Sarcoidosis   . Sarcoidosis   . Sleep apnea    c pap  . Smoker 02/12/2014    Tobacco History: Social History   Tobacco Use  Smoking Status Former Smoker  . Packs/day: 1.00  . Years: 20.00  . Pack years: 20.00  . Types: Cigarettes  . Quit date: 03/21/2015  . Years since quitting: 4.6  Smokeless Tobacco Never Used   Counseling given: Yes   Continue to not smoke  Outpatient Encounter Medications as of 11/05/2019  Medication Sig  . acetaminophen (TYLENOL) 500 MG tablet Take 500 mg by mouth every 4 (four) hours as needed for mild pain, moderate pain or headache.   . albuterol (PROAIR HFA) 108 (90 Base) MCG/ACT inhaler Inhale 2 puffs into the lungs every 6 (six) hours as needed for wheezing or shortness of breath.  Marland Kitchen albuterol (PROVENTIL) (2.5 MG/3ML) 0.083% nebulizer solution Take 3 mLs (2.5 mg total) by nebulization every 6 (six) hours as needed for  wheezing or shortness of breath.  . diltiazem (CARDIZEM) 30 MG tablet Take 1 tablet (30 mg total) by mouth 2 (two) times daily.  Marland Kitchen lisinopril (ZESTRIL) 2.5 MG tablet Take 2.5 mg by mouth daily.  . pantoprazole (PROTONIX) 40 MG tablet Take 1 tablet (40 mg total) by mouth daily.  . rivaroxaban (XARELTO) 20 MG TABS tablet Take 1 tablet (20 mg total) by mouth daily with supper.  . rosuvastatin (CRESTOR) 5 MG tablet Take 1 tablet (5 mg total) by mouth daily.  . Vitamin D, Ergocalciferol, (DRISDOL) 1.25 MG (50000 UNIT) CAPS capsule Take 1 capsule (50,000 Units total) by mouth every 7 (seven) days.   No facility-administered encounter medications on file as of 11/05/2019.     Review of Systems  Review of Systems  Constitutional: Negative for activity change, fatigue and fever.  HENT: Negative for sinus pressure, sinus pain and sore throat.   Respiratory: Negative for cough, shortness of breath and wheezing.   Cardiovascular: Negative for chest pain and  palpitations.  Gastrointestinal: Negative for diarrhea, nausea and vomiting.  Musculoskeletal: Negative for arthralgias.  Neurological: Negative for dizziness.  Psychiatric/Behavioral: Negative for sleep disturbance. The patient is not nervous/anxious.      Physical Exam  BP 116/74 (BP Location: Left Arm, Cuff Size: Normal)   Pulse 69   Ht _0  (1.651 m)   Wt (!) 212 lb 3.2 oz (96.3 kg)   SpO2 100%   BMI 35.31 kg/m   Wt Readings from Last 5 Encounters:  11/05/19 (!) 212 lb 3.2 oz (96.3 kg)  10/25/19 211 lb (95.7 kg)  10/15/19 211 lb (95.7 kg)  09/20/19 211 lb (95.7 kg)  09/18/19 211 lb 9.6 oz (96 kg)    BMI Readings from Last 5 Encounters:  11/05/19 35.31 kg/m  10/25/19 35.11 kg/m  10/15/19 35.11 kg/m  09/20/19 35.11 kg/m  09/18/19 35.21 kg/m     Physical Exam Vitals and nursing note reviewed.  Constitutional:      General: She is not in acute distress.    Appearance: Normal appearance. She is obese.  HENT:     Head: Normocephalic and atraumatic.     Right Ear: External ear normal.     Left Ear: External ear normal.  Cardiovascular:     Rate and Rhythm: Normal rate and regular rhythm.     Pulses: Normal pulses.     Heart sounds: Normal heart sounds. No murmur heard.   Pulmonary:     Effort: Pulmonary effort is normal. No respiratory distress.     Breath sounds: Normal breath sounds. No decreased air movement. No decreased breath sounds, wheezing or rales.  Musculoskeletal:     Cervical back: Normal range of motion.  Skin:    General: Skin is warm and dry.     Capillary Refill: Capillary refill takes less than 2 seconds.  Neurological:     General: No focal deficit present.     Mental Status: She is alert and oriented to person, place, and time. Mental status is at baseline.     Gait: Gait normal.  Psychiatric:        Mood and Affect: Mood normal.        Behavior: Behavior normal.        Thought Content: Thought content normal.        Judgment:  Judgment normal.       Assessment & Plan:   Bronchiectasis ([place]) Plan: Continue flutter valve Keep follow-up with our office in September/2021 to  see Dr. Halford Chessman  OSA (obstructive sleep apnea) Plan: Continue CPAP therapy Notify our office if you know that you've used your CPAP and you check your app and you're not seeing any reported data  Sarcoidosis of lung [facility] Plan: We'll continue to clinically monitor Patient's breathing is felt to be at baseline, if symptoms worsen could consider obtaining high-resolution CT imaging in 6 to 12 months Keep follow-up with Dr. Halford Chessman in September/2021    Return in about 2 months (around 01/06/2020), or if symptoms worsen or fail to improve, for Follow up with Dr. Halford Chessman.   Lauraine Rinne, NP 11/05/2019   This appointment required 26 minutes of patient care (this includes precharting, chart review, review of results, face-to-face care, etc.).

## 2019-11-05 NOTE — Patient Instructions (Addendum)
You were seen today by Hailey Ceo, NP  for:   1. OSA (obstructive sleep apnea)  Great job was working on increasing your compliance with your CPAP.  Please let our office know if you notice that you have used her CPAP at night and then when you check your app that you not seen any readings.  This is something that would like to follow-up with [employer] [facility] regarding if you are noticing these issues.  We recommend that you continue using your CPAP daily >>>Keep up the hard work using your device >>> Goal should be wearing this for the entire night that you are sleeping, at least 4 to 6 hours  Remember:  . Do not drive or operate heavy machinery if tired or drowsy.  . Please notify the supply company and office if you are unable to use your device regularly due to missing supplies or machine being broken.  . Work on maintaining a healthy weight and following your recommended nutrition plan  . Maintain proper daily exercise and movement  . Maintaining proper use of your device can also help improve management of other chronic illnesses such as: Blood pressure, blood sugars, and weight management.   BiPAP/ CPAP Cleaning:  >>>Clean weekly, with Dawn soap, and bottle brush.  Set up to air dry. >>> Wipe mask out daily with wet wipe or towelette   2. Bronchiectasis without complication (HCC)  Bronchiectasis: This is the medical term which indicates that you have damage, dilated airways making you more susceptible to respiratory infection. Use a flutter valve 10 breaths twice a day or 4 to 5 breaths 4-5 times a day to help clear mucus out Let us know if you have cough with change in mucus color or fevers or chills.  At that point you would need an antibiotic. Maintain a healthy nutritious diet, eating whole foods Take your medications as prescribed   3. Sarcoidosis of lung (HCC)  We will continue clinically monitor  May consider obtaining high-resolution CT imaging if you start to have  worsening cough or increased shortness of breath    Follow Up:    Return in about 2 months (around 01/06/2020), or if symptoms worsen or fail to improve, for Follow up with Dr. Craige Cotta.   Please do your part to reduce the spread of COVID-19:      Reduce your risk of any infection  and COVID19 by using the similar precautions used for avoiding the common cold or flu:  Marland Kitchen Wash your hands often with soap and warm water for at least 20 seconds.  If soap and water are not readily available, use an alcohol-based hand sanitizer with at least 60% alcohol.  . If coughing or sneezing, cover your mouth and nose by coughing or sneezing into the elbow areas of your shirt or coat, into a tissue or into your sleeve (not your hands). Drinda Butts A MASK when in public  . Avoid shaking hands with others and consider head nods or verbal greetings only. . Avoid touching your eyes, nose, or mouth with unwashed hands.  . Avoid close contact with people who are sick. . Avoid places or events with large numbers of people in one location, like concerts or sporting events. . If you have some symptoms but not all symptoms, continue to monitor at home and seek medical attention if your symptoms worsen. . If you are having a medical emergency, call 911.   ADDITIONAL HEALTHCARE OPTIONS FOR PATIENTS  [place]  Telehealth / e-Visit: [email or web address]         [facility]: [phone]  [facility]: [phone]                   [facility]: [phone]     It is flu season:   >>> Best ways to protect herself from the flu: Receive the yearly flu vaccine, practice good hand hygiene washing with soap and also using hand sanitizer when available, eat a nutritious meals, get adequate rest, hydrate appropriately   Please contact the office if your symptoms worsen or you have concerns that you are not improving.   Thank you for  choosing [facility] for your healthcare, and for allowing [place] to partner with you on your healthcare journey. I am thankful to be able to provide care to you today.   Elisha Headland FNP-C

## 2019-11-05 NOTE — Assessment & Plan Note (Signed)
Plan: Continue CPAP therapy Notify our office if you know that you've used your CPAP and you check your app and you're not seeing any reported data

## 2019-11-05 NOTE — Assessment & Plan Note (Signed)
Plan: We'll continue to clinically monitor Patient's breathing is felt to be at baseline, if symptoms worsen could consider obtaining high-resolution CT imaging in 6 to 12 months Keep follow-up with Dr. Craige Cotta in September/2021

## 2019-11-05 NOTE — Assessment & Plan Note (Signed)
Plan: Continue flutter valve Keep follow-up with our office in September/2021 to see Dr. Craige Cotta

## 2019-11-05 NOTE — Progress Notes (Signed)
Reviewed and agree with assessment/plan.   Coralyn Helling, MD [facility] Pulmonary/Critical Care 11/05/2019, 5:29 PM Pager:  [phone]

## 2019-11-07 NOTE — Progress Notes (Signed)
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   History of Present Illness: Hailey Ross is a 49 y.o. female seen in consultation per request of her nephrologist.  According to the patient she in 2018 she developed cough at the time her chest x-ray was abnormal and she was referred to a pulmonologist.  She was diagnosed with sarcoidosis.  She was under care of Dr. Halford Chessman.  She was on prednisone for 1-1/2-year and inhalers and then gradually came off the prednisone.  She states in January 2021 she started experiencing lightheadedness.  In March 2021 she coughed blood at work and was taken to the emergency room where she was diagnosed with pulmonary embolism.  She had extensive work-up and was placed on Xarelto.  She states she was found to have low GFR and some protein in her urine.  She was referred to the nephrologist.  Who did extensive labs and her ANA was positive.  He also placed her on lisinopril for proteinuria.  She has not had a kidney biopsy.  She states she has had right knee joint and right ankle joint swelling for the last 1 year.  She was seen by Dr. Luna Glasgow in [place] who did x-ray and MRI of her right knee joint.  She was later seen by Dr. Sherrian Divers in June 2021.  Patient states Dr. Erlinda Hong aspirated her knee joint.  None of the other joints are painful or swollen.  There is no family history of autoimmune disease.  She is gravida 3, para 3, miscarriages 0.  Activities of Daily Living:  Patient reports morning stiffness for 1  hour.   Patient Denies nocturnal pain.  Difficulty dressing/grooming: Denies Difficulty climbing stairs: Reports Difficulty getting out of chair: Denies Difficulty using hands for taps, buttons,  cutlery, and/or writing: Denies  Review of Systems  Constitutional: Positive for fatigue.  HENT: Positive for nose dryness. Negative for mouth sores and mouth dryness.   Eyes: Negative for itching and dryness.  Respiratory: Negative for shortness of breath and difficulty breathing.   Cardiovascular: Negative for chest pain and palpitations.  Gastrointestinal: Negative for blood in stool, constipation and diarrhea.  Endocrine: Positive for increased urination.  Genitourinary: Negative for difficulty urinating and painful urination.  Musculoskeletal: Positive for arthralgias, joint pain, joint swelling, myalgias, morning stiffness, muscle tenderness and myalgias.  Skin: Negative for color change, rash and redness.  Allergic/Immunologic: Negative for susceptible to infections.  Neurological: Positive for dizziness and numbness. Negative for headaches, memory loss and weakness.  Hematological: Positive for bruising/bleeding tendency.  Psychiatric/Behavioral: Negative for confusion and sleep disturbance.    PMFS History:  Patient Active Problem List   Diagnosis Date Noted  . Menorrhagia with regular cycle 10/15/2019  . OSA (obstructive sleep apnea) 10/08/2019  . Right knee pain 08/29/2019  . Positive ANA (antinuclear antibody) 08/29/2019  . Essential hypertension 07/04/2019  . Pain and swelling of right knee 07/04/2019  . Hyperlipidemia 07/04/2019  . Stage 2 chronic kidney disease 07/04/2019  . Sarcoidosis of lung ([place]) 07/04/2019  . Encounter for support and coordination of transition  of care 07/04/2019  . Daytime hypersomnolence 07/02/2019  . Pulmonary hypertension ([place]) 07/02/2019  . CKD (chronic kidney disease)   . Bilateral pulmonary embolism ([place]) 06/18/2019  . Lightheadedness 05/10/2019  . Vitamin D deficiency 05/10/2019  . Class 2 obesity due to excess calories with body mass index (BMI) of 37.0 to 37.9 in adult 04/16/2019  . Sarcoidosis 05/04/2015  . Bronchiectasis ([place])  05/04/2015    Past Medical History:  Diagnosis Date  . Acute cholecystitis 08/24/2016  . Acute kidney injury superimposed on chronic kidney disease ([place]) 06/20/2019  . Acute respiratory failure with hypoxia ([place]) 06/19/2019  . Annual visit for general adult medical examination with abnormal findings 04/16/2019  . Arthritis    right knee  . Bronchiectasis ([place])   . CKD (chronic kidney disease)   . Iron deficiency   . Lightheadedness 05/10/2019  . PE (pulmonary thromboembolism) ([place]) 06/2019  . Pre-syncope 04/16/2019  . Sarcoidosis   . Sarcoidosis   . Sleep apnea    c pap  . Smoker 02/12/2014    Family History  Problem Relation Age of Onset  . Hypertension Mother   . Cancer Mother   . Colon polyps Mother   . Healthy Sister   . Healthy Sister   . Healthy Sister   . Healthy Sister   . Healthy Son   . Healthy Son   . Healthy Daughter   . Colon cancer Neg Hx   . Esophageal cancer Neg Hx   . Rectal cancer Neg Hx   . Stomach cancer Neg Hx    Past Surgical History:  Procedure Laterality Date  . CHOLECYSTECTOMY N/A 08/25/2016   Procedure: LAPAROSCOPIC CHOLECYSTECTOMY;  Surgeon: Coralie Keens, MD;  Location: [facility];  Service: General;  Laterality: N/A;  . TUBAL LIGATION  02/1993   Social History   Social History Narrative   Live with partners Hailey Ross-19 years   3 children.    1-Hailey Ross (girl) 27 at home with her: 2 grandchildren in her home as well    Hailey Ross- 21 expecting   Hailey Ross-25      Enjoy: read, play games on tablet, grandkids      Diet: Veggies, does not eat a lot of fried foods, enjoys chicken and fruit   Caffeine: sodas and coffee (with sugar)   Water: Does not drink a lot at all      Wears seat beat   Does not wear sunscreen   Smoke and carbon monoxide detectors   Does not use phone while driving    Immunization History  Administered Date(s) Administered  . Influenza,inj,Quad PF,6+ Mos 12/21/2018  . Influenza,inj,Quad PF,6-35 Mos 01/10/2019  . PFIZER  SARS-COV-2 Vaccination 08/14/2019, 09/10/2019  . Pneumococcal Polysaccharide-23 02/12/2014  . Tdap 02/12/2014     Objective: Vital Signs: BP 120/85 (BP Location: Right Arm, Patient Position: Sitting, Cuff Size: Normal)   Pulse 57   Resp 16   Ht 5' 5"  (1.651 m)   Wt (!) 214 lb (97.1 kg)   BMI 35.61 kg/m    Physical Exam Vitals and nursing note reviewed.  Constitutional:      Appearance: She is well-developed.  HENT:     Head: Normocephalic and atraumatic.  Eyes:     Conjunctiva/sclera: Conjunctivae normal.  Cardiovascular:     Rate and Rhythm: Normal rate and regular rhythm.     Heart sounds: Normal heart sounds.  Pulmonary:     Effort: Pulmonary effort is normal.     Breath sounds: Normal breath sounds.  Abdominal:     General: Bowel sounds are normal.     Palpations: Abdomen is soft.  Musculoskeletal:     Cervical back: Normal range of motion.  Lymphadenopathy:     Cervical: No cervical adenopathy.  Skin:    General: Skin is warm and dry.     Capillary Refill: Capillary refill takes less than 2 seconds.  Neurological:     Mental Status: She is alert and oriented to person, place, and time.  Psychiatric:        Behavior: Behavior normal.      Musculoskeletal Exam: C-spine, thoracic and lumbar spine were in good range of motion.  Shoulder joints, elbow joints, wrist joints with good range of motion.  She had no MCP PIP or DIP swelling.  She has some tenderness across her PIPs.  Hip joints with good range of motion.  She has warmth swelling and effusion of her right knee joint.  She has some warmth on palpation of left knee joint.  She has warmth on palpation of her right ankle joint.  No MTP tenderness or swelling was noted.  CDAI Exam: CDAI Score: -- Patient Global: --; Provider Global: -- Swollen: --; Tender: -- Joint Exam 11/08/2019   No joint exam has been documented for this visit   There is currently no information documented on the homunculus. Go to the  Rheumatology activity and complete the homunculus joint exam.  Investigation: No additional findings.  Imaging: No results found.  Recent Labs: Lab Results  Component Value Date   WBC 12.4 (H) 09/15/2019   HGB 12.7 09/15/2019   PLT 364 09/15/2019   NA 138 09/15/2019   K 3.8 09/15/2019   CL 105 09/15/2019   CO2 24 09/15/2019   GLUCOSE 120 (H) 09/15/2019   BUN 15 09/15/2019   CREATININE 1.23 (H) 09/15/2019   BILITOT 0.7 08/21/2019   ALKPHOS 80 08/21/2019   AST 24 08/21/2019   ALT 18 08/21/2019   PROT 7.7 08/21/2019   ALBUMIN 3.9 08/21/2019   CALCIUM 9.2 09/15/2019   GFRAA 60 (L) 09/15/2019   QFTBGOLD Negative 05/01/2015    September 11, 2019 synovial fluid showed WBC 246 crystals negative  Aug 17, 2019 UA negative for protein  08/16/2019 ANA 1: 320, double-stranded ENA negative, SCL 70 -, smooth muscle negative, C3-C4 normal, Jo 1 -, a GBM negative, ribosomal P antibody negative, free kappa chain positive, hepatitis B negative, hepatitis C negative, HIV negative,  CMP 07/24/2019 creatinine 1.20, GFR 53, CBC normal, PTH normal, protein creatinine urine ratio normal, iron studies show iron saturation 12%  July 10, 2019 lupus anticoagulant positive, anticardiolipin negative  April 22, 2019 TSH normal   May 01, 2015 TB Gold negative, ANA negative, RF negative, ACE 195  July 15, 2019 chest x-ray did not show any lymphadenopathy.   CT chest 06/18/2019 peripheral segmental and subsegmental bilateral pulmonary embolism.  Bulky mediastinal and hilar lymph node and pulmonary findings consistent with history of sarcoidosis, possibly with an acute inflammatory or infectious exacerbation.  Pulmonary artery hypertension and borderline cardiomegaly.  Mild hepatomegaly. Speciality Comments: No specialty comments available.  Procedures:  No procedures performed Allergies: Patient has no known allergies.   Assessment / Plan:     Visit Diagnoses: Positive ANA (antinuclear antibody)  -patient had positive ANA.  She also had positive lupus anticoagulant which was checked when she had pulmonary embolism.  She denies any history of oral ulcers, nasal ulcers, malar rash, photosensitivity, Raynaud's phenomenon.  She has inflammatory  arthritis.  To complete the work-up I will obtain following antibodies today.  Plan: ANA, Anti-scleroderma antibody, RNP Antibody, Anti-Smith antibody, Sjogrens syndrome-A extractable nuclear antibody, Sjogrens syndrome-B extractable nuclear antibody, Anti-DNA antibody, double-stranded, C3 and C4, Beta-2 glycoprotein antibodies, Lupus Anticoagulant Eval w/Reflex  Sarcoidosis -she has known history of sarcoidosis.  She had pulmonary involvement in the past.  She was treated with prednisone for 1-1/2-year by Dr. Halford Chessman.  She states her pulmonary symptoms went into remission.  She has been experiencing pain and swelling in her right knee joint and right ankle.  Her ACE level was elevated in 2017.  I will obtain his level again today.  We also discussed that she may possibly need treatment for sarcoidosis.  Plan: Angiotensin converting enzyme  Pain in both hands -she complains of a stiffness and discomfort in her bilateral hands.  No synovitis was noted.  Plan: XR Hand 2 View Right, XR Hand 2 View Left.  X-rays obtained today showed changes consistent with osteoarthritis.  Effusion, right knee -she had warmth swelling and effusion in her right knee joint.  She was seen by Dr. Erlinda Hong in June and had knee joint aspiration but WBC count was not consistent with inflammatory arthritis.  She also had MRI of her right knee joint in April 2021 which showed tiny radial tear of the medial meniscus.  Plan: Sedimentation rate, Cyclic citrul peptide antibody, IgG  Chronic pain of right knee -patient had warmth and swelling in her bilateral knee joints today.  Pain in right ankle and joints of right foot-she complains of pain and swelling in the right knee joint.  She saw warmth on  palpation.  Plan: XR Ankle 2 Views Right.  X-ray findings are consistent with osteoarthritis.  Stage 2 chronic kidney disease -   Followed by Dr. Theador Hawthorne.  ESR is in 60s.  I did not see proteinuria on her last UA.  High risk medication use - Plan: CBC with Differential/Platelet, COMPLETE METABOLIC PANEL WITH GFR, QuantiFERON-TB Gold Plus, Serum protein electrophoresis with reflex, IgG, IgA, IgM, Thiopurine methyltransferase(tpmt)rbc, Glucose 6 phosphate dehydrogenase  Other fatigue - Plan: CK  Essential hypertension  Mixed hyperlipidemia  Pulmonary hypertension (HCC)-most likely related to sleep apnea.  I reviewed chest x-ray results from July 15, 2019 which showed pulmonary fibrosis.  I am uncertain about the etiology of pulmonary fibrosis.  Bronchiectasis without complication (HCC)-followed by Dr. Nicole Kindred.  I reviewed records.  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  Bilateral pulmonary embolism ([place]) - 06/18/2019 peripheral segmental and subsegmental bilateral pulmonary embolism.  Patient is on anticoagulant.  On reviewing the chart I found that she was lupus anticoagulant positive.  OSA (obstructive sleep apnea)-followed by Dr. Halford Chessman and she is on CPAP.  Daytime hypersomnolence  Vitamin D deficiency  Secondary hyperparathyroidism ([place])  Orders: Orders Placed This Encounter  Procedures  . XR Ankle 2 Views Right  . XR Hand 2 View Right  . XR Hand 2 View Left  . CBC with Differential/Platelet  . COMPLETE METABOLIC PANEL WITH GFR  . Sedimentation rate  . CK  . Cyclic citrul peptide antibody, IgG  . ANA  . Anti-scleroderma antibody  . RNP Antibody  . Anti-Smith antibody  . Sjogrens syndrome-A extractable nuclear antibody  . Sjogrens syndrome-B extractable nuclear antibody  . Anti-DNA antibody, double-stranded  . C3 and C4  . Beta-2 glycoprotein antibodies  . Lupus Anticoagulant Eval w/Reflex  . QuantiFERON-TB Gold Plus  . Serum protein electrophoresis  with reflex  . IgG, IgA,  IgM  . Thiopurine methyltransferase(tpmt)rbc  . Glucose 6 phosphate dehydrogenase  . Angiotensin converting enzyme   No orders of the defined types were placed in this encounter.   .  Follow-Up Instructions: Return for Sarcoidosis.   Bo Merino, MD  Note - This record has been created using Editor, commissioning.  Chart creation errors have been sought, but may not always  have been located. Such creation errors do not reflect on  the standard of medical care.

## 2019-11-08 ENCOUNTER — Ambulatory Visit: Payer: Self-pay

## 2019-11-08 ENCOUNTER — Other Ambulatory Visit: Payer: Self-pay

## 2019-11-08 ENCOUNTER — Ambulatory Visit (INDEPENDENT_AMBULATORY_CARE_PROVIDER_SITE_OTHER): Payer: 59 | Admitting: Rheumatology

## 2019-11-08 ENCOUNTER — Encounter: Payer: Self-pay | Admitting: Rheumatology

## 2019-11-08 VITALS — BP 120/85 | HR 57 | Resp 16 | Ht 65.0 in | Wt 214.0 lb

## 2019-11-08 DIAGNOSIS — M25571 Pain in right ankle and joints of right foot: Secondary | ICD-10-CM

## 2019-11-08 DIAGNOSIS — D869 Sarcoidosis, unspecified: Secondary | ICD-10-CM | POA: Diagnosis not present

## 2019-11-08 DIAGNOSIS — I10 Essential (primary) hypertension: Secondary | ICD-10-CM

## 2019-11-08 DIAGNOSIS — M25461 Effusion, right knee: Secondary | ICD-10-CM | POA: Diagnosis not present

## 2019-11-08 DIAGNOSIS — I272 Pulmonary hypertension, unspecified: Secondary | ICD-10-CM

## 2019-11-08 DIAGNOSIS — I2699 Other pulmonary embolism without acute cor pulmonale: Secondary | ICD-10-CM

## 2019-11-08 DIAGNOSIS — Z79899 Other long term (current) drug therapy: Secondary | ICD-10-CM

## 2019-11-08 DIAGNOSIS — M79642 Pain in left hand: Secondary | ICD-10-CM | POA: Diagnosis not present

## 2019-11-08 DIAGNOSIS — E559 Vitamin D deficiency, unspecified: Secondary | ICD-10-CM

## 2019-11-08 DIAGNOSIS — R768 Other specified abnormal immunological findings in serum: Secondary | ICD-10-CM | POA: Diagnosis not present

## 2019-11-08 DIAGNOSIS — N182 Chronic kidney disease, stage 2 (mild): Secondary | ICD-10-CM

## 2019-11-08 DIAGNOSIS — G4733 Obstructive sleep apnea (adult) (pediatric): Secondary | ICD-10-CM

## 2019-11-08 DIAGNOSIS — G4719 Other hypersomnia: Secondary | ICD-10-CM

## 2019-11-08 DIAGNOSIS — M79641 Pain in right hand: Secondary | ICD-10-CM | POA: Diagnosis not present

## 2019-11-08 DIAGNOSIS — G8929 Other chronic pain: Secondary | ICD-10-CM

## 2019-11-08 DIAGNOSIS — N2581 Secondary hyperparathyroidism of renal origin: Secondary | ICD-10-CM

## 2019-11-08 DIAGNOSIS — M25561 Pain in right knee: Secondary | ICD-10-CM

## 2019-11-08 DIAGNOSIS — R5383 Other fatigue: Secondary | ICD-10-CM

## 2019-11-08 DIAGNOSIS — J479 Bronchiectasis, uncomplicated: Secondary | ICD-10-CM

## 2019-11-08 DIAGNOSIS — R760 Raised antibody titer: Secondary | ICD-10-CM

## 2019-11-08 DIAGNOSIS — E782 Mixed hyperlipidemia: Secondary | ICD-10-CM

## 2019-11-12 ENCOUNTER — Ambulatory Visit (INDEPENDENT_AMBULATORY_CARE_PROVIDER_SITE_OTHER): Payer: 59 | Admitting: Internal Medicine

## 2019-11-12 ENCOUNTER — Encounter: Payer: Self-pay | Admitting: Internal Medicine

## 2019-11-12 VITALS — BP 110/70 | HR 58 | Ht 65.0 in | Wt 211.0 lb

## 2019-11-12 DIAGNOSIS — R109 Unspecified abdominal pain: Secondary | ICD-10-CM | POA: Diagnosis not present

## 2019-11-12 DIAGNOSIS — K219 Gastro-esophageal reflux disease without esophagitis: Secondary | ICD-10-CM

## 2019-11-12 MED ORDER — PANTOPRAZOLE SODIUM 40 MG PO TBEC: 40.0000 mg | DELAYED_RELEASE_TABLET | Freq: Every day | ORAL | 3 refills | Status: DC

## 2019-11-12 NOTE — Progress Notes (Signed)
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  Also GERD symptoms on no medical therapy.  See that dictation for details.  Patient subsequently underwent complete colonoscopy and upper endoscopy on September 20, 2019.  Complete colonoscopy was normal.  Her bleeding and lower abdominal discomfort was felt to be uterine in nature.  She is seeing gynecology for treatment of abnormal bleeding.  Upper endoscopy revealed nonspecific gastric erythema.  Exam was otherwise normal.  Testing for Helicobacter pylori was negative.  She was prescribed pantoprazole 40 mg daily.  She was asked to follow-up at this time.  Patient tells me that she still has some lower abdominal/pelvic discomfort which is unchanged.  She has not had treatment for her abnormal uterine bleeding but is anticipating such (her previous physician has changed practices).  Terms of reflux, she is feeling much better.  No active symptoms.  No medication side effects.  REVIEW OF SYSTEMS:  All non-GI ROS negative except for excessive urination, urinary frequency, urinary leakage  Past Medical History:  Diagnosis Date  . Acute cholecystitis 08/24/2016  . Acute kidney injury superimposed on chronic kidney disease (HCC) 06/20/2019  . Acute respiratory failure with hypoxia (HCC) 06/19/2019  . Annual visit for general adult medical examination with abnormal findings 04/16/2019  . Arthritis    right knee  . Bronchiectasis (HCC)   . CKD (chronic kidney disease)   . Iron deficiency   . Lightheadedness 05/10/2019  . PE (pulmonary thromboembolism) (HCC) 06/2019  . Pre-syncope 04/16/2019  . Sarcoidosis   . Sarcoidosis   . Sleep apnea    c pap  . Smoker 02/12/2014    Past Surgical History:  Procedure Laterality Date  . CHOLECYSTECTOMY N/A 08/25/2016   Procedure: LAPAROSCOPIC  CHOLECYSTECTOMY;  Surgeon: Hailey Miyamoto, MD;  [facility];  Service: General;  Laterality: N/A;  . TUBAL LIGATION  02/1993    Social History Hailey Ross  reports that she quit smoking about 4 years ago. Her smoking use included cigarettes. She has a 20.00 pack-year smoking history. She has never used smokeless tobacco. She reports current alcohol use. She reports that she does not use drugs.  family history includes Cancer in her mother; Colon polyps in her mother; Healthy in her daughter, sister, sister, sister, sister, son, and son; Hypertension in her mother.  No Known Allergies     PHYSICAL EXAMINATION: Vital signs: BP 110/70   Pulse (!) 58   Ht 5\' 5"  (1.651 m)   Wt 211 lb (95.7 kg)   BMI 35.11 kg/m   Constitutional: generally well-appearing, no acute distress Psychiatric: alert and oriented x3, cooperative Eyes: extraocular movements intact, anicteric, conjunctiva pink Mouth: oral pharynx moist, no lesions Neck: supple no lymphadenopathy Cardiovascular: heart regular rate and rhythm, no murmur Lungs: clear to auscultation bilaterally Abdomen: soft, nontender, nondistended, no obvious ascites, no peritoneal signs, normal bowel sounds, no organomegaly Rectal: Omitted Extremities: no lower extremity edema bilaterally Skin: no lesions on visible extremities Neuro: No focal deficits.  Cranial nerves intact  ASSESSMENT:  1.  GERD.  Negative EGD.  Symptoms controlled on PPI 2.  Normal colonoscopy 3.  Lower abdominal/pelvic discomfort and bleeding secondary to determine source   PLAN:  1.  Reflux precautions 2.  Continue pantoprazole 40 mg daily.  Prescription refilled.  Medication risks reviewed 3.  Routine GI follow-up 1  year 4.  Routine screening colonoscopy 10 years 5.  Resume gynecologic care regarding abnormal uterine bleeding

## 2019-11-12 NOTE — Patient Instructions (Signed)
We have sent the following medications to your pharmacy for you to pick up at your convenience:  Pantoprazole  Please follow up as needed

## 2019-11-12 NOTE — Progress Notes (Signed)
I will discuss results at the follow-up visit.

## 2019-11-15 LAB — COMPLETE METABOLIC PANEL WITH GFR
AG Ratio: 1.3 (calc) (ref 1.0–2.5)
ALT: 17 U/L (ref 6–29)
AST: 21 U/L (ref 10–35)
Albumin: 3.8 g/dL (ref 3.6–5.1)
Alkaline phosphatase (APISO): 71 U/L (ref 31–125)
BUN/Creatinine Ratio: 15 (calc) (ref 6–22)
BUN: 17 mg/dL (ref 7–25)
CO2: 25 mmol/L (ref 20–32)
Calcium: 8.6 mg/dL (ref 8.6–10.2)
Chloride: 106 mmol/L (ref 98–110)
Creat: 1.13 mg/dL — ABNORMAL HIGH (ref 0.50–1.10)
GFR, Est African American: 66 mL/min/{1.73_m2} (ref >=60)
GFR, Est Non African American: 57 mL/min/{1.73_m2} — ABNORMAL LOW (ref >=60)
Globulin: 2.9 g/dL (calc) (ref 1.9–3.7)
Glucose, Bld: 83 mg/dL (ref 65–99)
Potassium: 4.3 mmol/L (ref 3.5–5.3)
Sodium: 138 mmol/L (ref 135–146)
Total Bilirubin: 0.3 mg/dL (ref 0.2–1.2)
Total Protein: 6.7 g/dL (ref 6.1–8.1)

## 2019-11-15 LAB — SEDIMENTATION RATE: Sed Rate: 31 mm/h — ABNORMAL HIGH (ref 0–20)

## 2019-11-15 LAB — CBC WITH DIFFERENTIAL/PLATELET
Absolute Monocytes: 655 cells/uL (ref 200–950)
Basophils Absolute: 56 cells/uL (ref 0–200)
Basophils Relative: 0.5 %
Eosinophils Absolute: 100 cells/uL (ref 15–500)
Eosinophils Relative: 0.9 %
HCT: 38.3 % (ref 35.0–45.0)
Hemoglobin: 12.3 g/dL (ref 11.7–15.5)
Lymphs Abs: 3463 cells/uL (ref 850–3900)
MCH: 28.1 pg (ref 27.0–33.0)
MCHC: 32.1 g/dL (ref 32.0–36.0)
MCV: 87.4 fL (ref 80.0–100.0)
MPV: 9.6 fL (ref 7.5–12.5)
Monocytes Relative: 5.9 %
Neutro Abs: 6827 cells/uL (ref 1500–7800)
Neutrophils Relative %: 61.5 %
Platelets: 338 10*3/uL (ref 140–400)
RBC: 4.38 10*6/uL (ref 3.80–5.10)
RDW: 13 % (ref 11.0–15.0)
Total Lymphocyte: 31.2 %
WBC: 11.1 10*3/uL — ABNORMAL HIGH (ref 3.8–10.8)

## 2019-11-15 LAB — LUPUS ANTICOAGULANT EVAL W/ REFLEX
PTT-LA Screen: 43 s — ABNORMAL HIGH (ref <=40)
dRVVT: 68 s — ABNORMAL HIGH (ref <=45)

## 2019-11-15 LAB — PROTEIN ELECTROPHORESIS, SERUM, WITH REFLEX
Albumin ELP: 3.9 g/dL (ref 3.8–4.8)
Alpha 1: 0.3 g/dL (ref 0.2–0.3)
Alpha 2: 0.7 g/dL (ref 0.5–0.9)
Beta 2: 0.4 g/dL (ref 0.2–0.5)
Beta Globulin: 0.4 g/dL (ref 0.4–0.6)
Gamma Globulin: 1.1 g/dL (ref 0.8–1.7)
Total Protein: 6.8 g/dL (ref 6.1–8.1)

## 2019-11-15 LAB — IGG, IGA, IGM
IgG (Immunoglobin G), Serum: 1220 mg/dL (ref 600–1640)
IgM, Serum: 57 mg/dL (ref 50–300)
Immunoglobulin A: 250 mg/dL (ref 47–310)

## 2019-11-15 LAB — SJOGRENS SYNDROME-A EXTRACTABLE NUCLEAR ANTIBODY: SSA (Ro) (ENA) Antibody, IgG: <1 AI

## 2019-11-15 LAB — C3 AND C4
C3 Complement: 151 mg/dL (ref 83–193)
C4 Complement: 45 mg/dL (ref 15–57)

## 2019-11-15 LAB — RFLX DRVVT CONFRIM: DRVVT CONFIRM: NEGATIVE

## 2019-11-15 LAB — ANTI-DNA ANTIBODY, DOUBLE-STRANDED: ds DNA Ab: <1 IU/mL

## 2019-11-15 LAB — ANGIOTENSIN CONVERTING ENZYME: Angiotensin-Converting Enzyme: 16 U/L (ref 9–67)

## 2019-11-15 LAB — BETA-2 GLYCOPROTEIN ANTIBODIES
Beta-2 Glyco 1 IgA: <2 U/mL
Beta-2 Glyco 1 IgM: <2 U/mL
Beta-2 Glyco I IgG: <2 U/mL

## 2019-11-15 LAB — QUANTIFERON-TB GOLD PLUS
Mitogen-NIL: >10 IU/mL
NIL: 0.07 IU/mL
QuantiFERON-TB Gold Plus: NEGATIVE
TB1-NIL: <0 IU/mL
TB2-NIL: <0 IU/mL

## 2019-11-15 LAB — ANTI-SCLERODERMA ANTIBODY: Scleroderma (Scl-70) (ENA) Antibody, IgG: <1 AI

## 2019-11-15 LAB — CYCLIC CITRUL PEPTIDE ANTIBODY, IGG: Cyclic Citrullin Peptide Ab: <16 UNITS

## 2019-11-15 LAB — ANTI-SMITH ANTIBODY: ENA SM Ab Ser-aCnc: <1 AI

## 2019-11-15 LAB — RNP ANTIBODY: Ribonucleic Protein(ENA) Antibody, IgG: <1 AI

## 2019-11-15 LAB — ANTI-NUCLEAR AB-TITER (ANA TITER): ANA Titer 1: 1:160 {titer} — ABNORMAL HIGH

## 2019-11-15 LAB — SJOGRENS SYNDROME-B EXTRACTABLE NUCLEAR ANTIBODY: SSB (La) (ENA) Antibody, IgG: <1 AI

## 2019-11-15 LAB — ANA: Anti Nuclear Antibody (ANA): POSITIVE — AB

## 2019-11-15 LAB — GLUCOSE 6 PHOSPHATE DEHYDROGENASE: G-6PDH: 19 U/g Hgb (ref 7.0–20.5)

## 2019-11-15 LAB — THIOPURINE METHYLTRANSFERASE (TPMT), RBC: Thiopurine Methyltransferase, RBC: 15 nmol/hr/mL RBC

## 2019-11-15 LAB — CK: Total CK: 87 U/L (ref 29–143)

## 2019-11-15 LAB — RFLX HEXAGONAL PHASE CONFIRM: Hexagonal Phase Conf: NEGATIVE

## 2019-11-19 ENCOUNTER — Inpatient Hospital Stay (HOSPITAL_COMMUNITY): Payer: 59 | Attending: Hematology

## 2019-11-19 ENCOUNTER — Other Ambulatory Visit: Payer: Self-pay

## 2019-11-19 DIAGNOSIS — Z86711 Personal history of pulmonary embolism: Secondary | ICD-10-CM | POA: Diagnosis not present

## 2019-11-19 DIAGNOSIS — D869 Sarcoidosis, unspecified: Secondary | ICD-10-CM | POA: Diagnosis not present

## 2019-11-19 DIAGNOSIS — Z7901 Long term (current) use of anticoagulants: Secondary | ICD-10-CM | POA: Diagnosis not present

## 2019-11-19 DIAGNOSIS — I2699 Other pulmonary embolism without acute cor pulmonale: Secondary | ICD-10-CM

## 2019-11-19 DIAGNOSIS — Z87891 Personal history of nicotine dependence: Secondary | ICD-10-CM | POA: Insufficient documentation

## 2019-11-19 LAB — D-DIMER, QUANTITATIVE: D-Dimer, Quant: 0.34 ug/mL-FEU (ref 0.00–0.50)

## 2019-11-26 ENCOUNTER — Inpatient Hospital Stay (HOSPITAL_BASED_OUTPATIENT_CLINIC_OR_DEPARTMENT_OTHER): Payer: 59 | Admitting: Hematology

## 2019-11-26 ENCOUNTER — Other Ambulatory Visit: Payer: Self-pay

## 2019-11-26 VITALS — BP 121/77 | HR 61 | Temp 98.5°F | Resp 16 | Wt 214.3 lb

## 2019-11-26 DIAGNOSIS — I2699 Other pulmonary embolism without acute cor pulmonale: Secondary | ICD-10-CM | POA: Diagnosis not present

## 2019-11-26 DIAGNOSIS — Z86711 Personal history of pulmonary embolism: Secondary | ICD-10-CM | POA: Diagnosis not present

## 2019-11-26 NOTE — Progress Notes (Signed)
[facility] [address], [place] [address]   CLINIC:  Medical Oncology/Hematology  PCP:  Freddy Finner, NP [address] [place] / [place] [address]  [phone]  REASON FOR VISIT:  Follow-up for unprovoked pulmonary embolism  PRIOR THERAPY: None  CURRENT THERAPY: Xarelto  INTERVAL HISTORY:  [name], a 49 y.o. female, returns for routine follow-up for her unprovoked pulmonary embolism. Jaina was last seen on 07/25/2019.  Today she reports that she continues having menstrual bleeding; she is contemplating about getting an ablation instead of IUD. She has just finished her menses; she reports getting light-headed 2 days prior to her menses and all throughout her menses. The bleeding continues happening every 2 weeks with heavy bleeding the first 2 days, then lightens up the subsequent days; she denies having heavy menses before starting Xarelto. Her regular OB-GYN is Dr. Cherly Hensen, though she is currently seeing Dr. Colin Ina. She denies hemoptysis, hematochezia or hematuria.   REVIEW OF SYSTEMS:  Review of Systems  Constitutional: Positive for fatigue (mild). Negative for appetite change.  Respiratory: Negative for hemoptysis.   Gastrointestinal: Negative for blood in stool.  Genitourinary: Negative for hematuria.   Neurological: Positive for dizziness (2 days prior and throughout menses).  All other systems reviewed and are negative.   PAST MEDICAL/SURGICAL HISTORY:  Past Medical History:  Diagnosis Date  . Acute cholecystitis 08/24/2016  . Acute kidney injury superimposed on chronic kidney disease (HCC) 06/20/2019  . Acute respiratory failure with hypoxia (HCC) 06/19/2019  . Annual visit for general adult medical examination with abnormal findings 04/16/2019  . Arthritis    right knee  . Bronchiectasis (HCC)   . CKD (chronic kidney disease)   . Iron deficiency   . Lightheadedness 05/10/2019  . PE (pulmonary thromboembolism) (HCC) 06/2019    . Pre-syncope 04/16/2019  . Sarcoidosis   . Sarcoidosis   . Sleep apnea    c pap  . Smoker 02/12/2014   Past Surgical History:  Procedure Laterality Date  . CHOLECYSTECTOMY N/A 08/25/2016   Procedure: LAPAROSCOPIC CHOLECYSTECTOMY;  Surgeon: Abigail Miyamoto, MD;  Location: [facility] OR;  Service: General;  Laterality: N/A;  . TUBAL LIGATION  02/1993    SOCIAL HISTORY:  Social History   Socioeconomic History  . Marital status: Single    Spouse name: Not on file  . Number of children: 3  . Years of education: Not on file  . Highest education level: High school graduate  Occupational History  . Occupation: Administrator, [employer] ([employer])  Tobacco Use  . Smoking status: Former Smoker    Packs/day: 1.00    Years: 20.00    Pack years: 20.00    Types: Cigarettes    Quit date: 03/21/2015    Years since quitting: 4.6  . Smokeless tobacco: Never Used  Vaping Use  . Vaping Use: Never used  Substance and Sexual Activity  . Alcohol use: Yes    Alcohol/week: 0.0 standard drinks    Comment: Rare  . Drug use: No  . Sexual activity: Yes    Birth control/protection: Surgical  Other Topics Concern  . Not on file  Social History Narrative   Live with partners [name]-19 years   3 children.    1-[name] (girl) 27 at home with her: 2 grandchildren in her home as well    [name]- 26 expecting   [name]-25      Enjoy: read, play games on tablet, grandkids      Diet: Veggies,  does not eat a lot of fried foods, enjoys chicken and fruit   Caffeine: sodas and coffee (with sugar)   Water: Does not drink a lot at all      Wears seat beat   Does not wear sunscreen   Smoke and carbon monoxide detectors   Does not use phone while driving    Social Determinants of Health   Financial Resource Strain:   . Difficulty of Paying Living Expenses:   Food Insecurity:   . Worried About Programme researcher, broadcasting/film/video in the Last Year:   . Barista in the Last Year:   Transportation Needs:   . Automotive engineer (Medical):   Marland Kitchen Lack of Transportation (Non-Medical):   Physical Activity:   . Days of Exercise per Week:   . Minutes of Exercise per Session:   Stress:   . Feeling of Stress :   Social Connections:   . Frequency of Communication with Friends and Family:   . Frequency of Social Gatherings with Friends and Family:   . Attends Religious Services:   . Active Member of Clubs or Organizations:   . Attends Banker Meetings:   Marland Kitchen Marital Status:   Intimate Partner Violence:   . Fear of Current or Ex-Partner:   . Emotionally Abused:   Marland Kitchen Physically Abused:   . Sexually Abused:     FAMILY HISTORY:  Family History  Problem Relation Age of Onset  . Hypertension Mother   . Cancer Mother   . Colon polyps Mother   . Healthy Sister   . Healthy Sister   . Healthy Sister   . Healthy Sister   . Healthy Son   . Healthy Son   . Healthy Daughter   . Colon cancer Neg Hx   . Esophageal cancer Neg Hx   . Rectal cancer Neg Hx   . Stomach cancer Neg Hx     CURRENT MEDICATIONS:  Current Outpatient Medications  Medication Sig Dispense Refill  . acetaminophen (TYLENOL) 500 MG tablet Take 500 mg by mouth every 4 (four) hours as needed for mild pain, moderate pain or headache.     . albuterol (PROAIR HFA) 108 (90 Base) MCG/ACT inhaler Inhale 2 puffs into the lungs every 6 (six) hours as needed for wheezing or shortness of breath. 6.7 g 5  . albuterol (PROVENTIL) (2.5 MG/3ML) 0.083% nebulizer solution Take 3 mLs (2.5 mg total) by nebulization every 6 (six) hours as needed for wheezing or shortness of breath. 75 mL 1  . diltiazem (CARDIZEM) 30 MG tablet Take 1 tablet (30 mg total) by mouth 2 (two) times daily. 60 tablet 3  . lisinopril (ZESTRIL) 2.5 MG tablet Take 2.5 mg by mouth daily.    . pantoprazole (PROTONIX) 40 MG tablet Take 1 tablet (40 mg total) by mouth daily. 90 tablet 3  . rivaroxaban (XARELTO) 20 MG TABS tablet Take 1 tablet (20 mg total) by mouth daily with  supper. 30 tablet 5  . rosuvastatin (CRESTOR) 5 MG tablet Take 1 tablet (5 mg total) by mouth daily. 90 tablet 1  . Vitamin D, Ergocalciferol, (DRISDOL) 1.25 MG (50000 UNIT) CAPS capsule Take 1 capsule (50,000 Units total) by mouth every 7 (seven) days. 12 capsule 1   No current facility-administered medications for this visit.    ALLERGIES:  No Known Allergies  PHYSICAL EXAM:  Performance status (ECOG): 0 - Asymptomatic  Vitals:   11/26/19 1201  BP: 121/77  Pulse: 61  Resp: 16  Temp: 98.5 F (36.9 C)  SpO2: 100%   Wt Readings from Last 3 Encounters:  11/26/19 214 lb 4.8 oz (97.2 kg)  11/12/19 211 lb (95.7 kg)  11/08/19 (!) 214 lb (97.1 kg)   Physical Exam Vitals reviewed.  Constitutional:      Appearance: Normal appearance. She is obese.  Cardiovascular:     Rate and Rhythm: Normal rate and regular rhythm.     Pulses: Normal pulses.     Heart sounds: Normal heart sounds.  Pulmonary:     Effort: Pulmonary effort is normal.     Breath sounds: Normal breath sounds.  Musculoskeletal:     Right lower leg: No edema.     Left lower leg: No edema.  Neurological:     General: No focal deficit present.     Mental Status: She is alert and oriented to person, place, and time.  Psychiatric:        Mood and Affect: Mood normal.        Behavior: Behavior normal.     LABORATORY DATA:  I have reviewed the labs as listed.  CBC Latest Ref Rng & Units 11/08/2019 09/15/2019 08/21/2019  WBC 3.8 - 10.8 Thousand/uL 11.1(H) 12.4(H) 12.2(H)  Hemoglobin 11.7 - 15.5 g/dL 16.112.3 09.612.7 04.512.4  Hematocrit 35 - 45 % 38.3 41.6 40.6  Platelets 140 - 400 Thousand/uL 338 364 423(H)   CMP Latest Ref Rng & Units 11/08/2019 11/08/2019 09/15/2019  Glucose 65 - 99 mg/dL - 83 409(W120(H)  BUN 7 - 25 mg/dL - 17 15  Creatinine 1.190.50 - 1.10 mg/dL - 1.47(W1.13(H) 2.95(A1.23(H)  Sodium 135 - 146 mmol/L - 138 138  Potassium 3.5 - 5.3 mmol/L - 4.3 3.8  Chloride 98 - 110 mmol/L - 106 105  CO2 20 - 32 mmol/L - 25 24  Calcium 8.6 -  10.2 mg/dL - 8.6 9.2  Total Protein 6.1 - 8.1 g/dL 6.8 6.7 -  Total Bilirubin 0.2 - 1.2 mg/dL - 0.3 -  Alkaline Phos 38 - 126 U/L - - -  AST 10 - 35 U/L - 21 -  ALT 6 - 29 U/L - 17 -      Component Value Date/Time   RBC 4.38 11/08/2019 1026   MCV 87.4 11/08/2019 1026   MCH 28.1 11/08/2019 1026   MCHC 32.1 11/08/2019 1026   RDW 13.0 11/08/2019 1026   LYMPHSABS 3,463 11/08/2019 1026   MONOABS 0.7 07/12/2019 1404   EOSABS 100 11/08/2019 1026   BASOSABS 56 11/08/2019 1026    DIAGNOSTIC IMAGING:  I have independently reviewed the scans and discussed with the patient. XR Ankle 2 Views Right  Result Date: 11/08/2019 No tibiotalar or subtalar joint space narrowing was noted.  Inferior and posterior calcaneal spur was noted.  Calcification was noted in the fibula calcaneal and tibio calcaneal space. Impression: These findings are consistent with osteoarthritis.  XR Hand 2 View Left  Result Date: 11/08/2019 No MCP, intercarpal or radiocarpal joint space narrowing was noted.  No erosive changes were noted.  PIP and DIP narrowing was noted. Impression: These findings are consistent with osteoarthritis of the hand.  XR Hand 2 View Right  Result Date: 11/08/2019 No MCP, intercarpal or radiocarpal joint space narrowing was noted.  No erosive changes were noted.  PIP and DIP narrowing was noted. Impression: These findings are consistent with osteoarthritis of the hand.    ASSESSMENT:  1.  Unprovoked bilateral pulmonary embolism: -Presentation with right knee pain and swelling  and on and off lightheadedness since January 2021.  Presented to the ER on 06/18/2019 with worsening lightheadedness and coughing up blood-streaked sputum. -CT angiogram on 06/18/2019 showed peripheral segmental and subsegmental bilateral pulmonary emboli with no right heart strain.  Bulky mediastinal and hilar lymph nodes and pulmonary findings consistent with history of sarcoidosis. -Bilateral leg Doppler was negative on  the same day for DVT. -She is taking Xarelto without any bleeding issues.  She quit smoking 5 years ago and smoked 1 pack/day for 22 years. -Lupus anticoagulant was positive x1, negative on repeat testing.  Anticardiolipin antibodies negative.  Factor V Leiden and PT gene negative. -Indefinite anticoagulation was recommended.  2.  Sarcoidosis: -Diagnosed in January 2018, treated with prednisone for 1 and half year.  She is weaned off now and uses inhaler.  She follows up with Dr. Craige Cotta.  3.  Decreased right ear hearing: -She reports buzzing in the ears on and off and decreased right ear hearing.  She has used meclizine in the past without help. -Referral made to ENT.   PLAN:  1.  Unprovoked bilateral pulmonary embolism: -She is currently on Xarelto. -She is having excessive bleeding for 3 days during each cycle.  She also has bleeding intermittently. -She was evaluated by her GYN.  Patient is trying to choose between ablation versus hysterectomy. -D-dimer today is normal.  CBC on 11/08/2019 showed hemoglobin 12.3. -If she chooses to have a hysterectomy, we will consider bridging anticoagulation with Lovenox.  Otherwise I will see her back in 1 year for follow-up to evaluate risk-benefit ratio of continuing anticoagulation.  2.  Positive ANA: -She is being evaluated by Dr. Corliss Skains for positive ANA.  3.  CKD: -Follow-up with Dr. Wolfgang Phoenix.  Orders placed this encounter:  No orders of the defined types were placed in this encounter.    Doreatha Massed, MD [facility] [phone]   I, Drue Second, am acting as a scribe for Dr. Payton Mccallum.  I, Doreatha Massed MD, have reviewed the above documentation for accuracy and completeness, and I agree with the above.

## 2019-11-26 NOTE — Patient Instructions (Signed)
[facility] at [facility] Discharge Instructions  You were seen today by Dr. Ellin Saba. He went over your recent results. Keep your appointment with your OB-GYN about your procedure. Dr. Ellin Saba will see you back in 1 year for labs and follow up.   Thank you for choosing [facility] at [facility] to provide your oncology and hematology care.  To afford each patient quality time with our provider, please arrive at least 15 minutes before your scheduled appointment time.   If you have a lab appointment with the Cancer Center please come in thru the Main Entrance and check in at the main information desk  You need to re-schedule your appointment should you arrive 10 or more minutes late.  We strive to give you quality time with our providers, and arriving late affects you and other patients whose appointments are after yours.  Also, if you no show three or more times for appointments you may be dismissed from the clinic at the providers discretion.     Again, thank you for choosing [facility].  Our hope is that these requests will decrease the amount of time that you wait before being seen by our physicians.       _____________________________________________________________  Should you have questions after your visit to [facility], please contact our office at [phone] between the hours of 8:00 a.m. and 4:30 p.m.  Voicemails left after 4:00 p.m. will not be returned until the following business day.  For prescription refill requests, have your pharmacy contact our office and allow 72 hours.    Cancer Center Support Programs:   > Cancer Support Group  2nd Tuesday of the month 1pm-2pm, Journey Room

## 2019-12-06 NOTE — Progress Notes (Signed)
 Office Visit Note  Patient: Hailey Ross             Date of Birth: 12/20/1970           MRN: [id number]             PCP: Mills, Hannah M, NP Referring: Mills, Hannah M, NP Visit Date: 12/10/2019 Occupation: @GUAROCC@  Subjective:  Right knee pain and positive ANA.   History of Present Illness: Hailey Ross is a 49 y.o. female with history of sarcoidosis and osteoarthritis.  She was evaluated by me for positive ANA.  She denies any history of oral ulcers, nasal ulcers, malar rash, photosensitivity, sicca symptoms, Raynaud's phenomenon or lymphadenopathy.  She states she had some pain and swelling in the right knee joint which is resolved now.  She has occasional discomfort in her knee joints.  She continues to have chronic cough.  She has appointment coming up with pulmonologist in September.  Activities of Daily Living:  Patient reports morning stiffness for a few minutes.   Patient Reports nocturnal pain.  Difficulty dressing/grooming: Denies Difficulty climbing stairs: Reports Difficulty getting out of chair: Denies Difficulty using hands for taps, buttons, cutlery, and/or writing: Reports  Review of Systems  Constitutional: Positive for fatigue.  HENT: Positive for mouth sores, mouth dryness and nose dryness.   Eyes: Negative for itching and dryness.  Respiratory: Negative for shortness of breath and difficulty breathing.   Cardiovascular: Negative for chest pain and palpitations.  Gastrointestinal: Negative for blood in stool, constipation and diarrhea.  Endocrine: Positive for increased urination.  Genitourinary: Negative for difficulty urinating and painful urination.  Musculoskeletal: Positive for arthralgias, joint pain, joint swelling and morning stiffness. Negative for myalgias, muscle tenderness and myalgias.  Skin: Negative for color change, rash and redness.  Allergic/Immunologic: Negative for susceptible to infections.  Neurological: Positive for  dizziness, numbness, headaches and weakness. Negative for memory loss.  Hematological: Positive for bruising/bleeding tendency.  Psychiatric/Behavioral: Negative for confusion and sleep disturbance.    PMFS History:  Patient Active Problem List   Diagnosis Date Noted  . Menorrhagia with regular cycle 10/15/2019  . OSA (obstructive sleep apnea) 10/08/2019  . Right knee pain 08/29/2019  . Positive ANA (antinuclear antibody) 08/29/2019  . Essential hypertension 07/04/2019  . Pain and swelling of right knee 07/04/2019  . Hyperlipidemia 07/04/2019  . Stage 2 chronic kidney disease 07/04/2019  . 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 Encounter for support and coordination of transition of care 07/04/2019  . Daytime hypersomnolence 07/02/2019  . Pulmonary hypertension (HCC) 07/02/2019  . CKD (chronic kidney disease)   . Bilateral pulmonary embolism (HCC) 06/18/2019  . Lightheadedness 05/10/2019  . Vitamin D deficiency 05/10/2019  . Class 2 obesity due to excess calories with body mass index (BMI) of 37.0 to 37.9 in adult 04/16/2019  . Sarcoidosis 05/04/2015  . Bronchiectasis (HCC) 05/04/2015    Past Medical History:  Diagnosis Date  . Acute cholecystitis 08/24/2016  . Acute kidney injury superimposed on chronic kidney disease (HCC) 06/20/2019  . Acute respiratory failure with hypoxia (HCC) 06/19/2019  . Annual visit for general adult medical examination with abnormal findings 04/16/2019  . Arthritis    right knee  . Bronchiectasis (HCC)   . CKD (chronic kidney disease)   . Iron deficiency   . Lightheadedness 05/10/2019  . PE (pulmonary thromboembolism) (HCC) 06/2019  . Pre-syncope 04/16/2019  . Sarcoidosis   . Sarcoidosis   . Sleep apnea    c   pap  . Smoker 02/12/2014    Family History  Problem Relation Age of Onset  . Hypertension Mother   . Cancer Mother   . Colon polyps Mother   . Healthy Sister   . Healthy Sister   . Healthy Sister   . Healthy Sister   . Healthy Son   .  Healthy Son   . Healthy Daughter   . Colon cancer Neg Hx   . Esophageal cancer Neg Hx   . Rectal cancer Neg Hx   . Stomach cancer Neg Hx    Past Surgical History:  Procedure Laterality Date  . CHOLECYSTECTOMY N/A 08/25/2016   Procedure: LAPAROSCOPIC CHOLECYSTECTOMY;  Surgeon: Coralie Keens, MD;  Location: [place];  Service: General;  Laterality: N/A;  . TUBAL LIGATION  02/1993   Social History   Social History Narrative   Live with partners [name]-19 years   3 children.    1-[name] (girl) 27 at home with her: 2 grandchildren in her home as well    [name]- 36 expecting   [name]-25      Enjoy: read, play games on tablet, grandkids      Diet: Veggies, does not eat a lot of fried foods, enjoys chicken and fruit   Caffeine: sodas and coffee (with sugar)   Water: Does not drink a lot at all      Wears seat beat   Does not wear sunscreen   Smoke and carbon monoxide detectors   Does not use phone while driving    Immunization History  Administered Date(s) Administered  . Influenza,inj,Quad PF,6+ Mos 12/21/2018  . Influenza,inj,Quad PF,6-35 Mos 01/10/2019  . PFIZER SARS-COV-2 Vaccination 08/14/2019, 09/10/2019  . Pneumococcal Polysaccharide-23 02/12/2014  . Tdap 02/12/2014     Objective: Vital Signs: BP 121/82 (BP Location: Left Arm, Patient Position: Sitting, Cuff Size: Normal)   Pulse 62   Resp 16   Ht 5' 5" (1.651 m)   Wt 214 lb 9.6 oz (97.3 kg)   BMI 35.71 kg/m    Physical Exam Vitals and nursing note reviewed.  Constitutional:      Appearance: She is well-developed.  HENT:     Head: Normocephalic and atraumatic.  Eyes:     Conjunctiva/sclera: Conjunctivae normal.  Cardiovascular:     Rate and Rhythm: Normal rate and regular rhythm.     Heart sounds: Normal heart sounds.  Pulmonary:     Effort: Pulmonary effort is normal.     Breath sounds: Normal breath sounds.  Abdominal:     General: Bowel sounds are normal.     Palpations: Abdomen is soft.   Musculoskeletal:     Cervical back: Normal range of motion.  Lymphadenopathy:     Cervical: No cervical adenopathy.  Skin:    General: Skin is warm and dry.     Capillary Refill: Capillary refill takes less than 2 seconds.  Neurological:     Mental Status: She is alert and oriented to person, place, and time.  Psychiatric:        Behavior: Behavior normal.      Musculoskeletal Exam: C-spine thoracic and lumbar spine with good range of motion.  Shoulder joints, elbow joints, wrist joints, MCPs PIPs and DIPs with good range of motion with no synovitis.  Hip joints, knee joints, ankles, MTPs and PIPs with good range of motion with no synovitis.  The right knee joint swelling has completely resolved.  CDAI Exam: CDAI Score: -- Patient Global: --; Provider Global: -- Swollen: --;  Tender: -- Joint Exam 12/10/2019   No joint exam has been documented for this visit   There is currently no information documented on the homunculus. Go to the Rheumatology activity and complete the homunculus joint exam.  Investigation: No additional findings.  Imaging: No results found.  Recent Labs: Lab Results  Component Value Date   WBC 11.1 (H) 11/08/2019   HGB 12.3 11/08/2019   PLT 338 11/08/2019   NA 138 11/08/2019   K 4.3 11/08/2019   CL 106 11/08/2019   CO2 25 11/08/2019   GLUCOSE 83 11/08/2019   BUN 17 11/08/2019   CREATININE 1.13 (H) 11/08/2019   BILITOT 0.3 11/08/2019   ALKPHOS 80 08/21/2019   AST 21 11/08/2019   ALT 17 11/08/2019   PROT 6.7 11/08/2019   PROT 6.8 11/08/2019   ALBUMIN 3.9 08/21/2019   CALCIUM 8.6 11/08/2019   GFRAA 66 11/08/2019   QFTBGOLD Negative 05/01/2015   QFTBGOLDPLUS NEGATIVE 11/08/2019   November 08, 2019 SPEP normal, immunoglobulins normal, TB Gold negative, lupus anticoagulant negative, beta-2 negative, C3-C4 normal, ANA 1: 160 speckled, ENA negative, ACE 16, ESR 31, G6PD normal, TPMT normal   September 11, 2019 synovial fluid showed WBC 246 crystals  negative  Aug 17, 2019 UA negative for protein  08/16/2019 ANA 1: 320, double-stranded ENA negative, SCL 70 -, smooth muscle negative, C3-C4 normal, Jo 1 -, a GBM negative, ribosomal P antibody negative, free kappa chain positive, hepatitis B negative, hepatitis C negative, HIV negative,  CMP 07/24/2019 creatinine 1.20, GFR 53, CBC normal, PTH normal, protein creatinine urine ratio normal, iron studies show iron saturation 12%  July 10, 2019 lupus anticoagulant positive, anticardiolipin negative  Speciality Comments: No specialty comments available.  Procedures:  No procedures performed Allergies: Patient has no known allergies.   Assessment / Plan:     Visit Diagnoses: Positive ANA (antinuclear antibody) - ANA is low titer positive and persistent.  ENA negative, lupus anticoagulant negative now, beta-2 and anticardiolipin's were negative.  Complements are normal.  Patient has no clinical features of systemic lupus.  She denies any history of oral ulcers, nasal ulcers, rash, photosensitivity or Raynaud's phenomenon.  All the labs obtained at the last visit were reviewed at length.  Sarcoidosis - History of pulmonary sarcoidosis.  Treated with prednisone for 1-1/2-year by Dr. Sood.  The repeat chest x-ray done recently was normal.  She has appointment coming up with Dr. Sood.  If she has persistent pulmonary sarcoidosis then methotrexate will be a good choice.  I reviewed records from the pulmonologist.  Effusion, right knee - Treated by Dr. Xu.  Synovial WBC count was elevated.  MRI showed tiny radial tear of the medial meniscus.  All autoimmune work-up negative.  She had no warmth swelling or effusion in her knee joints today.  I have advised her to contact me in case she develops increased pain and swelling in other joints.  Primary osteoarthritis of both hands-joint protection muscle strengthening was discussed.  Pain in right ankle and joints of right foot - X-rays consistent with  osteoarthritis.  Bilateral pulmonary embolism (HCC)-she will be on lifelong anticoagulation.  Stage 2 chronic kidney disease-followed by Dr. Bhutani.  Pulmonary hypertension (HCC)-followed by pulmonary.  Bronchiectasis without complication (HCC)-she has chronic cough due to bronchiectasis.  Other medical problems are listed as follows:  OSA (obstructive sleep apnea)  Essential hypertension  Mixed hyperlipidemia  Daytime hypersomnolence  Secondary hyperparathyroidism (HCC)  Vitamin D deficiency  Orders: No orders of the defined types   were placed in this encounter.  No orders of the defined types were placed in this encounter.    Follow-Up Instructions: Return in about 4 months (around 04/10/2020) for Sarcoidosis, Osteoarthritis.    , MD  Note - This record has been created using Dragon software.  Chart creation errors have been sought, but may not always  have been located. Such creation errors do not reflect on  the standard of medical care. 

## 2019-12-10 ENCOUNTER — Other Ambulatory Visit: Payer: Self-pay

## 2019-12-10 ENCOUNTER — Encounter: Payer: Self-pay | Admitting: Family Medicine

## 2019-12-10 ENCOUNTER — Ambulatory Visit (INDEPENDENT_AMBULATORY_CARE_PROVIDER_SITE_OTHER): Payer: 59 | Admitting: Rheumatology

## 2019-12-10 ENCOUNTER — Encounter: Payer: Self-pay | Admitting: Rheumatology

## 2019-12-10 VITALS — BP 121/82 | HR 62 | Resp 16 | Ht 65.0 in | Wt 214.6 lb

## 2019-12-10 DIAGNOSIS — I10 Essential (primary) hypertension: Secondary | ICD-10-CM

## 2019-12-10 DIAGNOSIS — E559 Vitamin D deficiency, unspecified: Secondary | ICD-10-CM

## 2019-12-10 DIAGNOSIS — M25461 Effusion, right knee: Secondary | ICD-10-CM | POA: Diagnosis not present

## 2019-12-10 DIAGNOSIS — M19041 Primary osteoarthritis, right hand: Secondary | ICD-10-CM

## 2019-12-10 DIAGNOSIS — D869 Sarcoidosis, unspecified: Secondary | ICD-10-CM

## 2019-12-10 DIAGNOSIS — G4719 Other hypersomnia: Secondary | ICD-10-CM

## 2019-12-10 DIAGNOSIS — M25571 Pain in right ankle and joints of right foot: Secondary | ICD-10-CM

## 2019-12-10 DIAGNOSIS — J479 Bronchiectasis, uncomplicated: Secondary | ICD-10-CM

## 2019-12-10 DIAGNOSIS — I272 Pulmonary hypertension, unspecified: Secondary | ICD-10-CM

## 2019-12-10 DIAGNOSIS — M19042 Primary osteoarthritis, left hand: Secondary | ICD-10-CM

## 2019-12-10 DIAGNOSIS — N2581 Secondary hyperparathyroidism of renal origin: Secondary | ICD-10-CM

## 2019-12-10 DIAGNOSIS — G4733 Obstructive sleep apnea (adult) (pediatric): Secondary | ICD-10-CM

## 2019-12-10 DIAGNOSIS — R768 Other specified abnormal immunological findings in serum: Secondary | ICD-10-CM

## 2019-12-10 DIAGNOSIS — N182 Chronic kidney disease, stage 2 (mild): Secondary | ICD-10-CM

## 2019-12-10 DIAGNOSIS — E782 Mixed hyperlipidemia: Secondary | ICD-10-CM

## 2019-12-10 DIAGNOSIS — I2699 Other pulmonary embolism without acute cor pulmonale: Secondary | ICD-10-CM

## 2019-12-11 ENCOUNTER — Other Ambulatory Visit: Payer: Self-pay | Admitting: Family Medicine

## 2019-12-11 ENCOUNTER — Telehealth: Payer: Self-pay | Admitting: Adult Health

## 2019-12-11 ENCOUNTER — Other Ambulatory Visit: Payer: Self-pay | Admitting: *Deleted

## 2019-12-11 DIAGNOSIS — R42 Dizziness and giddiness: Secondary | ICD-10-CM

## 2019-12-11 MED ORDER — RIVAROXABAN 20 MG PO TABS
20.0000 mg | ORAL_TABLET | Freq: Every day | ORAL | 2 refills | Status: DC
Start: 2019-12-11 — End: 2020-08-27

## 2019-12-11 NOTE — Telephone Encounter (Signed)
Patient insurance requesting a 90 day supply, order for Xarelto changed to provide a 3 month supply. FYI to Rubye Oaks NP.

## 2019-12-11 NOTE — Telephone Encounter (Signed)
Yes, please thank you.

## 2019-12-26 ENCOUNTER — Ambulatory Visit: Payer: 59 | Admitting: Rheumatology

## 2019-12-31 ENCOUNTER — Other Ambulatory Visit: Payer: Self-pay | Admitting: *Deleted

## 2019-12-31 DIAGNOSIS — E559 Vitamin D deficiency, unspecified: Secondary | ICD-10-CM

## 2019-12-31 MED ORDER — VITAMIN D (ERGOCALCIFEROL) 1.25 MG (50000 UNIT) PO CAPS: 50000.0000 [IU] | ORAL_CAPSULE | ORAL | 1 refills | Status: DC

## 2020-01-08 ENCOUNTER — Ambulatory Visit (INDEPENDENT_AMBULATORY_CARE_PROVIDER_SITE_OTHER): Payer: 59 | Admitting: Pulmonary Disease

## 2020-01-08 ENCOUNTER — Encounter: Payer: Self-pay | Admitting: Pulmonary Disease

## 2020-01-08 ENCOUNTER — Other Ambulatory Visit: Payer: Self-pay

## 2020-01-08 VITALS — BP 110/70 | HR 93 | Temp 98.5°F | Ht 65.0 in | Wt 217.6 lb

## 2020-01-08 DIAGNOSIS — R05 Cough: Secondary | ICD-10-CM | POA: Diagnosis not present

## 2020-01-08 DIAGNOSIS — G4733 Obstructive sleep apnea (adult) (pediatric): Secondary | ICD-10-CM

## 2020-01-08 DIAGNOSIS — D860 Sarcoidosis of lung: Secondary | ICD-10-CM

## 2020-01-08 DIAGNOSIS — R058 Other specified cough: Secondary | ICD-10-CM

## 2020-01-08 DIAGNOSIS — J479 Bronchiectasis, uncomplicated: Secondary | ICD-10-CM | POA: Diagnosis not present

## 2020-01-08 DIAGNOSIS — R768 Other specified abnormal immunological findings in serum: Secondary | ICD-10-CM

## 2020-01-08 NOTE — Patient Instructions (Signed)
Try saline nasal spray daily until sinus congestion improved and then as needed Flonase 1 spray in each nostril daily until sinus congestion improved and then as needed Try an over the counter antihistamine pill daily until sinus congestion improved and then as needed Can try mucinex to help with chest congestion and cough Can try albuterol every 4 to 6 hours as needed for chest congestion, cough, or wheezing  Follow up in 6 months

## 2020-01-08 NOTE — Progress Notes (Signed)
[facility]  Chief Complaint  Patient presents with  . Follow-up    productive cough, clear to green phlegm, using cpap, feels like pressure is decreased    Constitutional:  BP 110/70 (BP Location: Left Arm, Cuff Size: Normal)   Pulse 93   Temp 98.5 F (36.9 C) (Skin)   Ht 5' 5"  (1.651 m)   Wt 217 lb 9.6 oz (98.7 kg)   SpO2 97%   BMI 36.21 kg/m   Past Medical History:  CKD, Anemia, PE March 2021  Past Surgical History:  Her  has a past surgical history that includes Tubal ligation (02/1993) and Cholecystectomy (N/A, 08/25/2016).  Brief Summary:  Hailey Ross is a 49 y.o. female former smoker with pulmonary sarcoidosis, bronchiectasis, obstructive sleep apnea, and allergic rhinitis.      Subjective:   She was found to have a PE in March 2021.  Remains on xarelto.  Uses CPAP nightly.  Doesn't feel like the pressure is blowing like it did before - was too strong before.  No issues with mask fit.  She gets sinus congestion and post nasal drip.  This triggers a cough.  She sometimes coughs phlegm up from her chest.  Not having fever, wheeze, or hemoptysis.  Has sinus pressure also.  Has been going on for couple of weeks.    Physical Exam:   Appearance - well kempt   ENMT - no sinus tenderness, deviated nasal septum, clear nasal drainage no oral exudate, no LAN, Mallampati 3 airway, no stridor  Respiratory - equal breath sounds bilaterally, no wheezing or rales  CV - s1s2 regular rate and rhythm, no murmurs  Ext - no clubbing, no edema  Skin - no rashes  Psych - normal mood and affect   Pulmonary testing:   Labs 04/21/15 >> HIV negative, Quantiferon gold negative, ACE 195, ANA, RF negative, ESR 32  PFT 05/26/15 >> FEV1 1.67 (66%), FEV1% 88, TLC 2/78 (53%), DLCO 36%  PFT 09/26/19 >> FEV1 1.33 (54%), FEV1% 91, TLC 3.17 (61%), DLCO 45%  Chest Imaging:   CT chest 03/18/15 >> biapical thickening with traction BTX,  BTX RML and lingula, subcarinal LAN 1.7 cm, patchy increased interstitial markings  HRCT chest 03/07/18 >> borderline LAN, patchy GGO, septal thickening, architectural distortion, cylindrical and varicose BTX  CT angio chest 06/18/19 >> peripheral and subsegmental PEs, bulky LAN  CT angio chest 07/15/19 >> apical fibrosis, no change in LAN  Sleep Tests:   HST 07/23/19 >> AHI 9.5, SpO2 low 74%  Cardiac Tests:   Echo 09/13/19 >> EF 55 to 60%, RVSP 35.3 mmHg, mild/mod TR  Social History:  She  reports that she quit smoking about 4 years ago. Her smoking use included cigarettes. She has a 20.00 pack-year smoking history. She has never used smokeless tobacco. She reports current alcohol use. She reports that she does not use drugs.  Family History:  Her family history includes Cancer in her mother; Colon polyps in her mother; Healthy in her daughter, sister, sister, sister, sister, son, and son; Hypertension in her mother.     Assessment/Plan:   Pulmonary sarcoidosis with bronchiectasis. - stable on recent imaging studies - prn albuterol  Obstructive sleep apnea. - she is compliant with therapy and reports benefit from CPAP - uses [employer] for her DME - continue auto CPAP 5 - 15 cm H2O  Upper airway cough with post nasal drip. 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 - remains on life long xarelto - followed by Dr. Delton Coombes with Hematology/Oncology  Positive ANA. - saw Dr. Estanislado Pandy with rheumatology in August 2258 - uncertain clinical significance  CKD 3a. - followed by Dr. Theador Hawthorne with nephrology  Time Spent Involved in Patient Care on Day of Examination:  44 minutes  Follow up:  Patient Instructions  Try saline nasal spray daily until sinus congestion improved and then as needed Flonase 1 spray in each  nostril daily until sinus congestion improved and then as needed Try an over the counter antihistamine pill daily until sinus congestion improved and then as needed Can try mucinex to help with chest congestion and cough Can try albuterol every 4 to 6 hours as needed for chest congestion, cough, or wheezing  Follow up in 6 months   Medication List:   Allergies as of 01/08/2020   No Known Allergies     Medication List       Accurate as of January 08, 2020  1:31 PM. If you have any questions, ask your nurse or doctor.        acetaminophen 500 MG tablet Commonly known as: TYLENOL Take 500 mg by mouth every 4 (four) hours as needed for mild pain, moderate pain or headache.   albuterol 108 (90 Base) MCG/ACT inhaler Commonly known as: ProAir HFA Inhale 2 puffs into the lungs every 6 (six) hours as needed for wheezing or shortness of breath.   albuterol (2.5 MG/3ML) 0.083% nebulizer solution Commonly known as: PROVENTIL Take 3 mLs (2.5 mg total) by nebulization every 6 (six) hours as needed for wheezing or shortness of breath.   diltiazem 30 MG tablet Commonly known as: Cardizem Take 1 tablet (30 mg total) by mouth 2 (two) times daily.   lisinopril 2.5 MG tablet Commonly known as: ZESTRIL Take 2.5 mg by mouth daily.   pantoprazole 40 MG tablet Commonly known as: PROTONIX Take 1 tablet (40 mg total) by mouth daily.   rivaroxaban 20 MG Tabs tablet Commonly known as: XARELTO Take 1 tablet (20 mg total) by mouth daily with supper.   rosuvastatin 5 MG tablet Commonly known as: CRESTOR Take 1 tablet (5 mg total) by mouth daily.   Vitamin D (Ergocalciferol) 1.25 MG (50000 UNIT) Caps capsule Commonly known as: DRISDOL Take 1 capsule (50,000 Units total) by mouth every 7 (seven) days.       Signature:  Chesley Mires, MD [place] Pager - [phone] 01/08/2020, 1:31 PM

## 2020-01-08 NOTE — Telephone Encounter (Signed)
Dr Craige Cotta, please advise on pt email, thanks!  Hi forgot to ask if I needed the booster shot!

## 2020-01-14 ENCOUNTER — Telehealth: Payer: Self-pay | Admitting: Pulmonary Disease

## 2020-01-14 NOTE — Telephone Encounter (Signed)
Called and spoke with patient about CPAP report per Dr Craige Cotta. All questions answered and patient expressed full understanding of Dr Evlyn Courier recommendation to continue using the CPAP whenever she is asleep. Nothing further needed at this time.

## 2020-01-14 NOTE — Telephone Encounter (Signed)
Auto CPAP 12/14/19 to 01/12/20 >> used on 18 of 30 nights with average 5 hrs 30 min.  Average AHI 0.1 with medican CPAP 8 and 95 th percentile CPAP 12 cm H2O.   Please let her know that CPAP report shows good control of sleep apnea with current settings.  She should try to use whenever she is asleep to get the maximal benefit from therapy.

## 2020-01-22 ENCOUNTER — Emergency Department (HOSPITAL_BASED_OUTPATIENT_CLINIC_OR_DEPARTMENT_OTHER): Payer: 59

## 2020-01-22 ENCOUNTER — Encounter (HOSPITAL_COMMUNITY): Payer: Self-pay | Admitting: Emergency Medicine

## 2020-01-22 ENCOUNTER — Other Ambulatory Visit: Payer: Self-pay

## 2020-01-22 ENCOUNTER — Emergency Department (HOSPITAL_COMMUNITY)
Admission: EM | Admit: 2020-01-22 | Discharge: 2020-01-22 | Disposition: A | Discharge status: Home / Self Care | Payer: 59 | Attending: Emergency Medicine | Admitting: Emergency Medicine

## 2020-01-22 ENCOUNTER — Emergency Department (HOSPITAL_COMMUNITY): Payer: 59

## 2020-01-22 DIAGNOSIS — R42 Dizziness and giddiness: Secondary | ICD-10-CM | POA: Diagnosis present

## 2020-01-22 DIAGNOSIS — Z79899 Other long term (current) drug therapy: Secondary | ICD-10-CM | POA: Insufficient documentation

## 2020-01-22 DIAGNOSIS — I2699 Other pulmonary embolism without acute cor pulmonale: Secondary | ICD-10-CM | POA: Diagnosis not present

## 2020-01-22 DIAGNOSIS — E860 Dehydration: Secondary | ICD-10-CM | POA: Diagnosis not present

## 2020-01-22 DIAGNOSIS — Z7901 Long term (current) use of anticoagulants: Secondary | ICD-10-CM | POA: Insufficient documentation

## 2020-01-22 DIAGNOSIS — I129 Hypertensive chronic kidney disease with stage 1 through stage 4 chronic kidney disease, or unspecified chronic kidney disease: Secondary | ICD-10-CM | POA: Insufficient documentation

## 2020-01-22 DIAGNOSIS — N182 Chronic kidney disease, stage 2 (mild): Secondary | ICD-10-CM | POA: Insufficient documentation

## 2020-01-22 DIAGNOSIS — Z87891 Personal history of nicotine dependence: Secondary | ICD-10-CM | POA: Insufficient documentation

## 2020-01-22 LAB — BASIC METABOLIC PANEL
Anion gap: 9 (ref 5–15)
BUN: 16 mg/dL (ref 6–20)
CO2: 23 mmol/L (ref 22–32)
Calcium: 9.1 mg/dL (ref 8.9–10.3)
Chloride: 106 mmol/L (ref 98–111)
Creatinine, Ser: 1.31 mg/dL — ABNORMAL HIGH (ref 0.44–1.00)
GFR, Estimated: 48 mL/min — ABNORMAL LOW (ref >60)
Glucose, Bld: 125 mg/dL — ABNORMAL HIGH (ref 70–99)
Potassium: 3.7 mmol/L (ref 3.5–5.1)
Sodium: 138 mmol/L (ref 135–145)

## 2020-01-22 LAB — CBC
HCT: 39.7 % (ref 36.0–46.0)
Hemoglobin: 12.1 g/dL (ref 12.0–15.0)
MCH: 27.2 pg (ref 26.0–34.0)
MCHC: 30.5 g/dL (ref 30.0–36.0)
MCV: 89.2 fL (ref 80.0–100.0)
Platelets: 334 10*3/uL (ref 150–400)
RBC: 4.45 MIL/uL (ref 3.87–5.11)
RDW: 12.7 % (ref 11.5–15.5)
WBC: 13.2 10*3/uL — ABNORMAL HIGH (ref 4.0–10.5)
nRBC: 0 % (ref 0.0–0.2)

## 2020-01-22 LAB — I-STAT BETA HCG BLOOD, ED (MC, WL, AP ONLY): I-stat hCG, quantitative: <5 m[IU]/mL (ref <5)

## 2020-01-22 LAB — TROPONIN I (HIGH SENSITIVITY)
Troponin I (High Sensitivity): 8 ng/L (ref <18)
Troponin I (High Sensitivity): 8 ng/L (ref <18)

## 2020-01-22 MED ORDER — SODIUM CHLORIDE 0.9 % IV BOLUS
1000.0000 mL | Freq: Once | INTRAVENOUS | Status: AC
  Administered 2020-01-22: 1000 mL via INTRAVENOUS

## 2020-01-22 NOTE — ED Triage Notes (Addendum)
Pt reports hx of PE in march, pt currently on xarelto, having some heaviness in bilateral legs and pain to R calf, denies any missed anti coag doses. C/o feeling like her heart was racing w/ some assoc chest pain while at work today. Hx of sarcoidosis, denies new or worsening sob. Denies any recent travel/ long car rides.

## 2020-01-22 NOTE — ED Provider Notes (Signed)
[facility] EMERGENCY DEPARTMENT Provider Note   CSN: [id number] Arrival date & time: 01/22/20  9233     History Chief Complaint  Patient presents with  . Palpitations    Hailey Ross is a 49 y.o. female with a past medical history of CKD, prior PE currently on Xarelto, sarcoidosis, OSA presenting to the ED with a chief complaint of palpitations and lightheadedness.  States that she was at work sitting down when all of a sudden she felt that she was lightheaded.  When standing up she felt like her heart was racing, her Apple Watch told her that her heart rates were in the 130s.  This slowly decreased with rest.  She reports history of similar symptoms in the past but states that "it has not happened in a long time."  She did have some chest pain during the episode as well.  All this has gradually resolved since arriving to the ER.  She is also had chronic "heaviness" to both of her legs that she states that "no one is ever been able to tell me why."  She does note for the past 2 days she has been having pain to her right calf that is worse with movements.  She has been compliant with her Xarelto and has not missed any doses.  States that "all this is happened me before, I realize it usually happens to me when I am on my cycle like I am now."  She denies any recent immobilization, shortness of breath, cough or hemoptysis, fever, vomiting, diarrhea, abdominal pain.  HPI     Past Medical History:  Diagnosis Date  . Acute cholecystitis 08/24/2016  . Acute kidney injury superimposed on chronic kidney disease (HCC) 06/20/2019  . Acute respiratory failure with hypoxia (HCC) 06/19/2019  . Annual visit for general adult medical examination with abnormal findings 04/16/2019  . Arthritis    right knee  . Bronchiectasis (HCC)   . CKD (chronic kidney disease)   . Iron deficiency   . Lightheadedness 05/10/2019  . PE (pulmonary thromboembolism) (HCC) 06/2019  . Pre-syncope  04/16/2019  . Sarcoidosis   . Sarcoidosis   . Sleep apnea    c pap  . Smoker 02/12/2014    Patient Active Problem List   Diagnosis Date Noted  . Menorrhagia with regular cycle 10/15/2019  . OSA (obstructive sleep apnea) 10/08/2019  . Right knee pain 08/29/2019  . Positive ANA (antinuclear antibody) 08/29/2019  . Essential hypertension 07/04/2019  . Pain and swelling of right knee 07/04/2019  . Hyperlipidemia 07/04/2019  . Stage 2 chronic kidney disease 07/04/2019  . 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 Encounter for support and coordination of transition of care 07/04/2019  . Daytime hypersomnolence 07/02/2019  . Pulmonary hypertension (HCC) 07/02/2019  . CKD (chronic kidney disease)   . Bilateral pulmonary embolism (HCC) 06/18/2019  . Lightheadedness 05/10/2019  . Vitamin D deficiency 05/10/2019  . Class 2 obesity due to excess calories with body mass index (BMI) of 37.0 to 37.9 in adult 04/16/2019  . Sarcoidosis 05/04/2015  . Bronchiectasis (HCC) 05/04/2015    Past Surgical History:  Procedure Laterality Date  . CHOLECYSTECTOMY N/A 08/25/2016   Procedure: LAPAROSCOPIC CHOLECYSTECTOMY;  Surgeon: Abigail Miyamoto, MD;  Location: [facility] OR;  Service: General;  Laterality: N/A;  . TUBAL LIGATION  02/1993     OB History    Gravida  3   Para  3   Term  3   Preterm  AB      Living  3     SAB      TAB      Ectopic      Multiple      Live Births              Family History  Problem Relation Age of Onset  . Hypertension Mother   . Cancer Mother   . Colon polyps Mother   . Healthy Sister   . Healthy Sister   . Healthy Sister   . Healthy Sister   . Healthy Son   . Healthy Son   . Healthy Daughter   . Colon cancer Neg Hx   . Esophageal cancer Neg Hx   . Rectal cancer Neg Hx   . Stomach cancer Neg Hx     Social History   Tobacco Use  . Smoking status: Former Smoker    Packs/day: 1.00    Years: 20.00    Pack years: 20.00    Types:  Cigarettes    Quit date: 03/21/2015    Years since quitting: 4.8  . Smokeless tobacco: Never Used  Vaping Use  . Vaping Use: Never used  Substance Use Topics  . Alcohol use: Yes    Alcohol/week: 0.0 standard drinks    Comment: Rare  . Drug use: No    Home Medications Prior to Admission medications   Medication Sig Start Date End Date Taking? Authorizing Provider  acetaminophen (TYLENOL) 500 MG tablet Take 500 mg by mouth every 4 (four) hours as needed for mild pain, moderate pain or headache.     [provider]  albuterol (PROAIR HFA) 108 (90 Base) MCG/ACT inhaler Inhale 2 puffs into the lungs every 6 (six) hours as needed for wheezing or shortness of breath. 04/23/19   Coralyn Helling, MD  albuterol (PROVENTIL) (2.5 MG/3ML) 0.083% nebulizer solution Take 3 mLs (2.5 mg total) by nebulization every 6 (six) hours as needed for wheezing or shortness of breath. 07/22/19   Parrett, Virgel Bouquet, NP  diltiazem (CARDIZEM) 30 MG tablet Take 1 tablet (30 mg total) by mouth 2 (two) times daily. 10/18/19   Antoine Poche, MD  lisinopril (ZESTRIL) 2.5 MG tablet Take 2.5 mg by mouth daily. 08/21/19   [provider]  pantoprazole (PROTONIX) 40 MG tablet Take 1 tablet (40 mg total) by mouth daily. 11/12/19   Hilarie Fredrickson, MD  rivaroxaban (XARELTO) 20 MG TABS tablet Take 1 tablet (20 mg total) by mouth daily with supper. 12/11/19   Parrett, Virgel Bouquet, NP  rosuvastatin (CRESTOR) 5 MG tablet Take 1 tablet (5 mg total) by mouth daily. 08/29/19   Freddy Finner, NP  Vitamin D, Ergocalciferol, (DRISDOL) 1.25 MG (50000 UNIT) CAPS capsule Take 1 capsule (50,000 Units total) by mouth every 7 (seven) days. 12/31/19   Freddy Finner, NP    Allergies    Patient has no known allergies.  Review of Systems   Review of Systems  Constitutional: Negative for appetite change, chills and fever.  HENT: Negative for ear pain, rhinorrhea, sneezing and sore throat.   Eyes: Negative for photophobia and visual  disturbance.  Respiratory: Negative for cough, chest tightness, shortness of breath and wheezing.   Cardiovascular: Positive for chest pain and palpitations.  Gastrointestinal: Negative for abdominal pain, blood in stool, constipation, diarrhea, nausea and vomiting.  Genitourinary: Negative for dysuria, hematuria and urgency.  Musculoskeletal: Positive for myalgias.  Skin: Negative for rash.  Neurological: Positive for light-headedness.  Negative for dizziness and weakness.    Physical Exam Updated Vital Signs BP 114/73   Pulse (!) 59   Temp 98.2 F (36.8 C) (Oral)   Resp 16   Ht 5\' 5"  (1.651 m)   Wt 97.1 kg   LMP 01/22/2020   SpO2 99%   BMI 35.61 kg/m   Physical Exam Vitals and nursing note reviewed.  Constitutional:      General: She is not in acute distress.    Appearance: She is well-developed.     Comments: Speaking in complete sentences without difficulty.  No signs of respiratory distress.  HENT:     Head: Normocephalic and atraumatic.     Nose: Nose normal.  Eyes:     General: No scleral icterus.       Left eye: No discharge.     Conjunctiva/sclera: Conjunctivae normal.  Cardiovascular:     Rate and Rhythm: Normal rate and regular rhythm.     Heart sounds: Normal heart sounds. No murmur heard.  No friction rub. No gallop.   Pulmonary:     Effort: Pulmonary effort is normal. No respiratory distress.     Breath sounds: Normal breath sounds.  Abdominal:     General: Bowel sounds are normal. There is no distension.     Palpations: Abdomen is soft.     Tenderness: There is no abdominal tenderness. There is no guarding.  Musculoskeletal:        General: Normal range of motion.     Cervical back: Normal range of motion and neck supple.     Comments: No lower extremity edema, erythema or calf tenderness bilaterally.  Skin:    General: Skin is warm and dry.     Findings: No rash.  Neurological:     Mental Status: She is alert.     Motor: No abnormal muscle tone.      Coordination: Coordination normal.     ED Results / Procedures / Treatments   Labs (all labs ordered are listed, but only abnormal results are displayed) Labs Reviewed  BASIC METABOLIC PANEL - Abnormal; Notable for the following components:      Result Value   Glucose, Bld 125 (*)    Creatinine, Ser 1.31 (*)    GFR, Estimated 48 (*)    All other components within normal limits  CBC - Abnormal; Notable for the following components:   WBC 13.2 (*)    All other components within normal limits  I-STAT BETA HCG BLOOD, ED ([facility], [facility], [facility] ONLY)  TROPONIN I (HIGH SENSITIVITY)  TROPONIN I (HIGH SENSITIVITY)    EKG None  Radiology DG Chest 2 View  Result Date: 01/22/2020 CLINICAL DATA:  Chest pain, palpitations EXAM: CHEST - 2 VIEW COMPARISON:  Chest x-ray from July 15, 2019 FINDINGS: Biapical cystic changes and bronchiectatic process better demonstrated on prior CT. Interstitial thickening more pronounced in the lung apices with similar appearance. No lobar consolidative changes. No sign of pleural effusion. No acute skeletal process on limited assessment. IMPRESSION: Stable appearance of fibrotic changes in the chest with apical predominance. Electronically Signed   By: July 17, 2019 M.D.   On: 01/22/2020 08:45   VAS 01/24/2020 LOWER EXTREMITY VENOUS (DVT) (ONLY [facility] & [facility])  Result Date: 01/22/2020  Lower Venous DVTStudy Indications: Pulmonary embolism.  Comparison Study: 07/15/19 previous Performing Technologist: 09/14/19 RVS  Examination Guidelines: A complete evaluation includes B-mode imaging, spectral Doppler, color Doppler, and power Doppler as needed of all accessible portions of each  vessel. Bilateral testing is considered an integral part of a complete examination. Limited examinations for reoccurring indications may be performed as noted. The reflux portion of the exam is performed with the patient in reverse Trendelenburg.   +---------+---------------+---------+-----------+----------+--------------+ RIGHT    CompressibilityPhasicitySpontaneityPropertiesThrombus Aging +---------+---------------+---------+-----------+----------+--------------+ CFV      Full           Yes      Yes                                 +---------+---------------+---------+-----------+----------+--------------+ SFJ      Full                                                        +---------+---------------+---------+-----------+----------+--------------+ FV Prox  Full                                                        +---------+---------------+---------+-----------+----------+--------------+ FV Mid   Full                                                        +---------+---------------+---------+-----------+----------+--------------+ FV DistalFull                                                        +---------+---------------+---------+-----------+----------+--------------+ PFV      Full                                                        +---------+---------------+---------+-----------+----------+--------------+ POP      Full           Yes      Yes                                 +---------+---------------+---------+-----------+----------+--------------+ PTV      Full                                                        +---------+---------------+---------+-----------+----------+--------------+ PERO     Full                                                        +---------+---------------+---------+-----------+----------+--------------+   +----+---------------+---------+-----------+----------+--------------+ LEFTCompressibilityPhasicitySpontaneityPropertiesThrombus Aging +----+---------------+---------+-----------+----------+--------------+ CFV Full  Yes      Yes                                  +----+---------------+---------+-----------+----------+--------------+     Summary: RIGHT: - There is no evidence of deep vein thrombosis in the lower extremity.  - No cystic structure found in the popliteal fossa.  LEFT: - No evidence of common femoral vein obstruction.  *See table(s) above for measurements and observations.    Preliminary     Procedures Procedures (including critical care time)  Medications Ordered in ED Medications  sodium chloride 0.9 % bolus 1,000 mL (1,000 mLs Intravenous New Bag/Given 01/22/20 1035)    ED Course  I have reviewed the triage vital signs and the nursing notes.  Pertinent labs & imaging results that were available during my care of the patient were reviewed by me and considered in my medical decision making (see chart for details).    MDM Rules/Calculators/A&P                          49yo F with a past medical history of CKD, prior PE currently on Xarelto, sarcoidosis presenting to the ED with a chief complaint of palpitation and lightheadedness.  She was sitting down at work when all of a sudden she felt like she was lightheaded.  For like her heart was racing when she stood up.  She had sharp chest pain associated with this.  Reports similar symptoms in the past but states that it has not happened to her in a long time.  Also has been feeling heaviness in both of her legs which is chronic for her.  Has not missed any doses of her Xarelto.  No shortness of breath.  On exam there is no significant edema or tenderness of the right lower extremity.  She does not appear in respiratory distress, she is not tachycardic, tachypneic or hypoxic.  She is speaking complete sentences without difficulty.  No neurological deficits noted.  DVT study is negative.  EKG shows normal sinus rhythm.  Chest x-ray without any acute findings.  Lab work including CBC, hCG unremarkable.  Initial troponin are both negative.  BMP with slight elevation in creatinine to 1.3.  She was  given IV fluids for this.  She remains symptom free on this ED visit.  Suspect that symptoms could be due to dehydration.  Doubt ACS, PE or other emergent cause of her symptoms as she has been compliant with her Xarelto and remains hemodynamically stable.  No structural cause noted on chest x-ray.  We will have her continue home medications, increase hydration and return for worsening symptoms.   Patient is hemodynamically stable, in NAD, and able to ambulate in the ED. Evaluation does not show pathology that would require ongoing emergent intervention or inpatient treatment. I explained the diagnosis to the patient. Pain has been managed and has no complaints prior to discharge. Patient is comfortable with above plan and is stable for discharge at this time. All questions were answered prior to disposition. Strict return precautions for returning to the ED were discussed. Encouraged follow up with PCP.   An After Visit Summary was printed and given to the patient.   Portions of this note were generated with Scientist, clinical (histocompatibility and immunogenetics)Dragon dictation software. Dictation errors may occur despite best attempts at proofreading.  Final Clinical Impression(s) / ED Diagnoses Final diagnoses:  Dehydration  Rx / DC Orders ED Discharge Orders    None       Dietrich Pates, PA-C 01/22/20 1146    Melene Plan, [place] 01/22/20 1149

## 2020-01-22 NOTE — ED Notes (Signed)
Verbal order for DVT study given to this RN by Dr. Adela Lank.

## 2020-01-22 NOTE — Progress Notes (Signed)
Lower extremity venous has been completed.   Preliminary results in CV Proc.   Blanch Media 01/22/2020 9:38 AM

## 2020-01-22 NOTE — Discharge Instructions (Addendum)
Continue your home medications as previously prescribed. Follow-up with your primary care provider. Return to the ER if you start to experience worsening chest pain, shortness of breath, dizziness.

## 2020-01-23 ENCOUNTER — Ambulatory Visit: Payer: 59 | Admitting: Rheumatology

## 2020-01-28 ENCOUNTER — Ambulatory Visit (INDEPENDENT_AMBULATORY_CARE_PROVIDER_SITE_OTHER): Payer: 59 | Admitting: Family Medicine

## 2020-01-28 ENCOUNTER — Encounter: Payer: Self-pay | Admitting: Family Medicine

## 2020-01-28 ENCOUNTER — Other Ambulatory Visit: Payer: Self-pay

## 2020-01-28 VITALS — BP 122/74 | HR 90 | Temp 97.7°F | Ht 65.0 in | Wt 222.0 lb

## 2020-01-28 DIAGNOSIS — I10 Essential (primary) hypertension: Secondary | ICD-10-CM

## 2020-01-28 DIAGNOSIS — R002 Palpitations: Secondary | ICD-10-CM

## 2020-01-28 DIAGNOSIS — Z09 Encounter for follow-up examination after completed treatment for conditions other than malignant neoplasm: Secondary | ICD-10-CM | POA: Diagnosis not present

## 2020-01-28 NOTE — Assessment & Plan Note (Signed)
Review of note and labs.  No noted changes in medications needed or updated lab work needed.

## 2020-01-28 NOTE — Progress Notes (Signed)
Subjective:  Patient ID: Hailey Ross, female    DOB: [birth year]  Age: 49 y.o. MRN: [id number]  CC:  Chief Complaint  Patient presents with  . Hospitalization Follow-up    was having heart palpitations, they have resolved      HPI  HPI  Hailey Ross is a 49 y.o. 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  She presented to the emergency room on 10/13 with a chief complaint of palpitations and lightheadedness.  She stated that she was at work sitting down when all of a sudden she felt that she was lightheaded.  When standing up she felt like her heart was racing, her Apple Watch told her that her heart rates were in the 130s.  This slowly decreased with rest.  She reported history of similar symptoms in the past but states that "it has not happened in a long time."  She did have some chest pain during the episode.  All gradually resolved on arrival to ED.  She does note for the past 2 days she has been having pain to her right calf that is worse with movements.  She has been compliant with her Xarelto and has not missed any doses.  States that "all this is happened me before, I realize it usually happens to me when I am on my cycle like I am now."  She denies any recent immobilization, shortness of breath, cough or hemoptysis, fever, vomiting, diarrhea, abdominal pain.  She is now going to follow up with her GYN to see if any of this can be hormone related. She denies any changes in habits including sleep, eating or caffeine intake.   She denies Chest pain, palpitations, leg swelling, headaches, dizziness or vision changes today in the office.     Today patient denies signs and symptoms of COVID 19 infection including fever, chills, cough, shortness of breath, and headache. Past Medical, Surgical, Social History, Allergies, and Medications have been Reviewed.   Past Medical History:  Diagnosis Date  .  Acute cholecystitis 08/24/2016  . Acute kidney injury superimposed on chronic kidney disease (HCC) 06/20/2019  . Acute respiratory failure with hypoxia (HCC) 06/19/2019  . Annual visit for general adult medical examination with abnormal findings 04/16/2019  . Arthritis    right knee  . Bronchiectasis (HCC)   . CKD (chronic kidney disease)   . Iron deficiency   . Lightheadedness 05/10/2019  . PE (pulmonary thromboembolism) (HCC) 06/2019  . Pre-syncope 04/16/2019  . Sarcoidosis   . Sarcoidosis   . Sleep apnea    c pap  . Smoker 02/12/2014    Current Meds  Medication Sig  . acetaminophen (TYLENOL) 500 MG tablet Take 500 mg by mouth every 4 (four) hours as needed for mild pain, moderate pain or headache.   . albuterol (PROAIR HFA) 108 (90 Base) MCG/ACT inhaler Inhale 2 puffs into the lungs every 6 (six) hours as needed for wheezing or shortness of breath.  Marland Kitchen albuterol (PROVENTIL) (2.5 MG/3ML) 0.083% nebulizer solution Take 3 mLs (2.5 mg total) by nebulization every 6 (six) hours as needed for wheezing or shortness of breath.  . diltiazem (CARDIZEM) 30 MG tablet Take 1 tablet (30 mg total) by mouth 2 (two) times daily.  Marland Kitchen lisinopril (ZESTRIL) 2.5 MG tablet Take 2.5 mg by mouth daily.  . pantoprazole (PROTONIX) 40 MG tablet Take 1 tablet (40 mg total) by mouth daily.  Marland Kitchen  rivaroxaban (XARELTO) 20 MG TABS tablet Take 1 tablet (20 mg total) by mouth daily with supper.  . rosuvastatin (CRESTOR) 5 MG tablet Take 1 tablet (5 mg total) by mouth daily.  . Vitamin D, Ergocalciferol, (DRISDOL) 1.25 MG (50000 UNIT) CAPS capsule Take 1 capsule (50,000 Units total) by mouth every 7 (seven) days.    ROS:  Review of Systems  Constitutional: Negative.   HENT: Negative.   Eyes: Negative.   Respiratory: Negative.   Cardiovascular: Negative.   Gastrointestinal: Negative.   Genitourinary: Negative.   Musculoskeletal: Negative.   Skin: Negative.   Neurological: Negative.   Endo/Heme/Allergies: Negative.     Psychiatric/Behavioral: Negative.      Objective:   Today's Vitals: BP 122/74 (BP Location: Left Arm, Patient Position: Sitting, Cuff Size: Normal)   Pulse 90   Temp 97.7 F (36.5 C) (Tympanic)   Ht 5\' 5"  (1.651 m)   Wt 222 lb (100.7 kg)   LMP 01/22/2020   SpO2 97%   BMI 36.94 kg/m  Vitals with BMI 01/28/2020 01/22/2020 01/22/2020  Height 5\' 5"  - -  Weight 222 lbs - -  BMI 36.94 - -  Systolic 122 114 01/24/2020  Diastolic 74 73 86  Pulse 90 59 55     Physical Exam Vitals and nursing note reviewed.  Constitutional:      Appearance: Normal appearance. She is well-developed and well-groomed. She is obese.  HENT:     Head: Normocephalic and atraumatic.     Right Ear: External ear normal.     Left Ear: External ear normal.     Mouth/Throat:     Comments: Mask in place  Eyes:     General:        Right eye: No discharge.        Left eye: No discharge.     Conjunctiva/sclera: Conjunctivae normal.  Cardiovascular:     Rate and Rhythm: Normal rate and regular rhythm.     Pulses: Normal pulses.     Heart sounds: Normal heart sounds.  Pulmonary:     Effort: Pulmonary effort is normal.     Breath sounds: Normal breath sounds.  Musculoskeletal:        General: Normal range of motion.     Cervical back: Normal range of motion and neck supple.  Skin:    General: Skin is warm.  Neurological:     General: No focal deficit present.     Mental Status: She is alert and oriented to person, place, and time.  Psychiatric:        Attention and Perception: Attention normal.        Mood and Affect: Mood normal.        Speech: Speech normal.        Behavior: Behavior normal. Behavior is cooperative.        Thought Content: Thought content normal.        Cognition and Memory: Cognition normal.        Judgment: Judgment normal.      Assessment   1. Encounter for examination following treatment at hospital   2. Palpitations   3. Essential hypertension     Tests ordered No  orders of the defined types were placed in this encounter.    Plan: Please see assessment and plan per problem list above.   No orders of the defined types were placed in this encounter.   Patient to follow-up in 3-4 months   Note: This dictation was prepared  with Dragon dictation along with smaller phrase technology. Similar sounding words can be transcribed inadequately or may not be corrected upon review. Any transcriptional errors that result from this process are unintentional.      Freddy Finner, NP

## 2020-01-28 NOTE — Assessment & Plan Note (Signed)
Controlled continue current medication regime.  DASH diet and exercise is recommended.  Especially for deconditioning with her heart and lungs.

## 2020-01-28 NOTE — Assessment & Plan Note (Signed)
She declines having any palpitations since being in the emergency room.

## 2020-01-28 NOTE — Patient Instructions (Signed)
  HAPPY FALL!  I appreciate the opportunity to provide you with care for your health and wellness. Today we discussed: recent ED visit   Follow up: 3 -4 months   No labs or referrals today  Safe travels!   Please continue to practice social distancing to keep you, your family, and our community safe.  If you must go out, please wear a mask and practice good handwashing.  It was a pleasure to see you and I look forward to continuing to work together on your health and well-being. Please do not hesitate to call the office if you need care or have questions about your care.  Have a wonderful day and week. With Gratitude, Tereasa Coop, DNP, AGNP-BC

## 2020-01-31 MED ORDER — ALBUTEROL SULFATE HFA 108 (90 BASE) MCG/ACT IN AERS: 2.0000 | INHALATION_SPRAY | Freq: Four times a day (QID) | RESPIRATORY_TRACT | 5 refills | Status: DC | PRN

## 2020-02-04 ENCOUNTER — Encounter: Payer: Self-pay | Admitting: Family Medicine

## 2020-02-04 NOTE — Telephone Encounter (Signed)
Pt made an appt with Dahlia Client 02-05-20 at 4:20

## 2020-02-05 ENCOUNTER — Other Ambulatory Visit: Payer: Self-pay

## 2020-02-05 ENCOUNTER — Ambulatory Visit (INDEPENDENT_AMBULATORY_CARE_PROVIDER_SITE_OTHER): Payer: 59 | Admitting: Family Medicine

## 2020-02-05 ENCOUNTER — Encounter: Payer: Self-pay | Admitting: Family Medicine

## 2020-02-05 VITALS — BP 116/74 | HR 64 | Temp 98.2°F | Ht 65.0 in | Wt 220.0 lb

## 2020-02-05 DIAGNOSIS — M25471 Effusion, right ankle: Secondary | ICD-10-CM | POA: Diagnosis not present

## 2020-02-05 NOTE — Assessment & Plan Note (Signed)
She has no swelling in office today. Encouraged increased exercise Compression socks when working Increase water intake Avoid salt Return precautions reviewed. Patient acknowledged agreement and understanding of the plan.

## 2020-02-05 NOTE — Patient Instructions (Signed)
  HAPPY FALL!  I appreciate the opportunity to provide you with care for your health and wellness. Today we discussed: foot swelling   Follow up: as scheduled  No labs or referrals today  Recommendations :)  Compression socks while working (dont wear to bed) Increase water intake  Avoid salt Exercise 30 mins daily   Please continue to practice social distancing to keep you, your family, and our community safe.  If you must go out, please wear a mask and practice good handwashing.  It was a pleasure to see you and I look forward to continuing to work together on your health and well-being. Please do not hesitate to call the office if you need care or have questions about your care.  Have a wonderful day and week. With Gratitude, Tereasa Coop, DNP, AGNP-BC

## 2020-02-05 NOTE — Progress Notes (Signed)
Subjective:  Patient ID: Hailey Ross, female    DOB: 11/25/1970  Age: 49 y.o. MRN: [id number]  CC:  Chief Complaint  Patient presents with  . Edema    R foot x5 days      HPI  HPI Hailey Ross is a 49 year old female patient that presents today for an acute visit due to on going swelling of her Right ankle and foot area. She denies having injury or trauma. Denies falls. Does report sitting a lot at work now. Does not wear compression. She continues to take her xaretlo without trouble. She is on this secondary to a PE. Overall she is doing well and was just seen last week after having recent treatment in the ED. She denies chest pain, shortness of breath or cough.  She reports the swelling is gone now, but she could not associate it with anything outside her cycle is about to start and she thinks it is the main reason she feels poorly at times. She has an appt with her GYN Monday.   Today patient denies signs and symptoms of COVID 19 infection including fever, chills, cough, shortness of breath, and headache. Past Medical, Surgical, Social History, Allergies, and Medications have been Reviewed.   Past Medical History:  Diagnosis Date  . Acute cholecystitis 08/24/2016  . Acute kidney injury superimposed on chronic kidney disease (HCC) 06/20/2019  . Acute respiratory failure with hypoxia (HCC) 06/19/2019  . Annual visit for general adult medical examination with abnormal findings 04/16/2019  . Arthritis    right knee  . Bronchiectasis (HCC)   . CKD (chronic kidney disease)   . Iron deficiency   . Lightheadedness 05/10/2019  . PE (pulmonary thromboembolism) (HCC) 06/2019  . Pre-syncope 04/16/2019  . Sarcoidosis   . Sarcoidosis   . Sleep apnea    c pap  . Smoker 02/12/2014    Current Meds  Medication Sig  . acetaminophen (TYLENOL) 500 MG tablet Take 500 mg by mouth every 4 (four) hours as needed for mild pain, moderate pain or headache.   . albuterol (PROAIR HFA) 108 (90  Base) MCG/ACT inhaler Inhale 2 puffs into the lungs every 6 (six) hours as needed for wheezing or shortness of breath.  Marland Kitchen albuterol (PROVENTIL) (2.5 MG/3ML) 0.083% nebulizer solution Take 3 mLs (2.5 mg total) by nebulization every 6 (six) hours as needed for wheezing or shortness of breath.  . diltiazem (CARDIZEM) 30 MG tablet Take 1 tablet (30 mg total) by mouth 2 (two) times daily.  Marland Kitchen lisinopril (ZESTRIL) 2.5 MG tablet Take 2.5 mg by mouth daily.  . pantoprazole (PROTONIX) 40 MG tablet Take 1 tablet (40 mg total) by mouth daily.  . rivaroxaban (XARELTO) 20 MG TABS tablet Take 1 tablet (20 mg total) by mouth daily with supper.  . rosuvastatin (CRESTOR) 5 MG tablet Take 1 tablet (5 mg total) by mouth daily.  . Vitamin D, Ergocalciferol, (DRISDOL) 1.25 MG (50000 UNIT) CAPS capsule Take 1 capsule (50,000 Units total) by mouth every 7 (seven) days.    ROS:  Review of Systems  Constitutional: Negative.   HENT: Negative.   Eyes: Negative.   Respiratory: Negative.   Cardiovascular: Negative.        See hpi   Gastrointestinal: Negative.   Genitourinary: Negative.   Musculoskeletal: Negative.   Skin: Negative.   Neurological: Negative.   Endo/Heme/Allergies: Negative.   Psychiatric/Behavioral: Negative.      Objective:   Today's Vitals: BP 116/74 (BP  Location: Left Arm, Patient Position: Sitting, Cuff Size: Normal)   Pulse 64   Temp 98.2 F (36.8 C) (Tympanic)   Ht 5\' 5"  (1.651 m)   Wt 220 lb (99.8 kg)   LMP 01/22/2020   SpO2 98%   BMI 36.61 kg/m  Vitals with BMI 02/05/2020 01/28/2020 01/22/2020  Height 5\' 5"  5\' 5"  -  Weight 220 lbs 222 lbs -  BMI 36.61 36.94 -  Systolic 116 122 01/24/2020  Diastolic 74 74 73  Pulse 64 90 59     Physical Exam Vitals and nursing note reviewed.  Constitutional:      Appearance: Normal appearance. She is well-developed and well-groomed. She is obese.  HENT:     Head: Normocephalic and atraumatic.     Right Ear: External ear normal.     Left Ear:  External ear normal.     Mouth/Throat:     Comments: Mask in place  Eyes:     General:        Right eye: No discharge.        Left eye: No discharge.     Conjunctiva/sclera: Conjunctivae normal.  Cardiovascular:     Rate and Rhythm: Normal rate and regular rhythm.     Pulses: Normal pulses.     Heart sounds: Normal heart sounds.  Pulmonary:     Effort: Pulmonary effort is normal.     Breath sounds: Normal breath sounds.  Musculoskeletal:        General: Normal range of motion.     Cervical back: Normal range of motion and neck supple.     Right lower leg: No edema.     Left lower leg: No edema.  Feet:     Comments: No noted swelling Full ROM Skin:    General: Skin is warm.  Neurological:     General: No focal deficit present.     Mental Status: She is alert and oriented to person, place, and time.  Psychiatric:        Attention and Perception: Attention normal.        Mood and Affect: Mood normal.        Speech: Speech normal.        Behavior: Behavior normal. Behavior is cooperative.        Thought Content: Thought content normal.        Cognition and Memory: Cognition normal.        Judgment: Judgment normal.      Assessment   1. Right ankle swelling     Tests ordered No orders of the defined types were placed in this encounter.    Plan: Please see assessment and plan per problem list above.   No orders of the defined types were placed in this encounter.   Patient to follow-up in as scheduled  Note: This dictation was prepared with Dragon dictation along with smaller phrase technology. Similar sounding words can be transcribed inadequately or may not be corrected upon review. Any transcriptional errors that result from this process are unintentional.      , NP

## 2020-02-10 ENCOUNTER — Other Ambulatory Visit: Payer: Self-pay | Admitting: Obstetrics and Gynecology

## 2020-02-17 ENCOUNTER — Other Ambulatory Visit: Payer: Self-pay | Admitting: Cardiology

## 2020-02-18 ENCOUNTER — Ambulatory Visit (INDEPENDENT_AMBULATORY_CARE_PROVIDER_SITE_OTHER): Payer: 59 | Admitting: Diagnostic Neuroimaging

## 2020-02-18 ENCOUNTER — Encounter: Payer: Self-pay | Admitting: Diagnostic Neuroimaging

## 2020-02-18 VITALS — BP 105/67 | HR 66 | Ht 65.0 in | Wt 221.2 lb

## 2020-02-18 DIAGNOSIS — D869 Sarcoidosis, unspecified: Secondary | ICD-10-CM

## 2020-02-18 DIAGNOSIS — R42 Dizziness and giddiness: Secondary | ICD-10-CM | POA: Diagnosis not present

## 2020-02-18 NOTE — Patient Instructions (Signed)
  INTERMITTENT LIGHTHEADEDNESS - may be due to combination of low BP, menstrual cycle, dehydration, pulmonary sarcoidosis and pulmonary embolism - check MRI brain w/wo, carotid u/s to rule out other causes

## 2020-02-18 NOTE — Progress Notes (Signed)
[facility]  PATIENT: Hailey Ross DOB: Mar 20, 1971  REFERRING CLINICIAN: Freddy Finner, NP HISTORY FROM: patient  REASON FOR VISIT: new consult    HISTORICAL  CHIEF COMPLAINT:  Chief Complaint  Patient presents with  . Lightheadedness    rm 7 New Pt "lightheaded on and off since January"    HISTORY OF PRESENT ILLNESS:   49 year old female here for evaluation of lightheadedness.  History of chronic kidney disease, sarcoidosis, sleep apnea, anemia.  January 2021 patient had onset of near syncope attack during coughing.  Few weeks later she had dizziness and lightheadedness.  Episodes last either few seconds or few minutes at a time.  Symptoms occurring 50% of the days, 5-6 times per day.  No specific triggering aggravating factors.  Sometimes it is worse during her menstrual cycle.  No other associated symptoms.  No double vision, slurred speech or numbness.  Symptoms she feels heaviness in the legs during some of the attacks.  She has not measured her blood pressure with these attacks.  Patient has had multiple ER visits for the symptoms.  In March 2020 when she was diagnosed with PE and started on anticoagulation.  On that time she was diagnosed with dehydration.  She is also been to the ER for shortness of breath and palpitations.    REVIEW OF SYSTEMS: Full 14 system review of systems performed and negative with exception of: As per HPI.  ALLERGIES: No Known Allergies  HOME MEDICATIONS: Outpatient Medications Prior to Visit  Medication Sig Dispense Refill  . acetaminophen (TYLENOL) 500 MG tablet Take 500 mg by mouth every 4 (four) hours as needed for mild pain, moderate pain or headache.     . albuterol (PROAIR HFA) 108 (90 Base) MCG/ACT inhaler Inhale 2 puffs into the lungs every 6 (six) hours as needed for wheezing or shortness of breath. 6.7 g 5  . albuterol (PROVENTIL) (2.5 MG/3ML) 0.083% nebulizer solution Take 3 mLs (2.5 mg total) by  nebulization every 6 (six) hours as needed for wheezing or shortness of breath. 75 mL 1  . diltiazem (CARDIZEM) 30 MG tablet TAKE 1 TABLET(30 MG) BY MOUTH TWICE DAILY 180 tablet 1  . lisinopril (ZESTRIL) 2.5 MG tablet Take 2.5 mg by mouth daily.    . rivaroxaban (XARELTO) 20 MG TABS tablet Take 1 tablet (20 mg total) by mouth daily with supper. 90 tablet 2  . rosuvastatin (CRESTOR) 5 MG tablet Take 1 tablet (5 mg total) by mouth daily. 90 tablet 1  . Vitamin D, Ergocalciferol, (DRISDOL) 1.25 MG (50000 UNIT) CAPS capsule Take 1 capsule (50,000 Units total) by mouth every 7 (seven) days. 12 capsule 1  . pantoprazole (PROTONIX) 40 MG tablet Take 1 tablet (40 mg total) by mouth daily. (Patient not taking: Reported on 02/18/2020) 90 tablet 3   No facility-administered medications prior to visit.    PAST MEDICAL HISTORY: Past Medical History:  Diagnosis Date  . Acute cholecystitis 08/24/2016  . Acute kidney injury superimposed on chronic kidney disease (HCC) 06/20/2019  . Acute respiratory failure with hypoxia (HCC) 06/19/2019  . Annual visit for general adult medical examination with abnormal findings 04/16/2019  . Arthritis    right knee  . Bronchiectasis (HCC)   . CKD (chronic kidney disease)   . Iron deficiency   . Lightheadedness 05/10/2019  . PE (pulmonary thromboembolism) (HCC) 06/2019  . Pre-syncope 04/16/2019  . Sarcoidosis   . Sarcoidosis   . Sleep apnea    c pap  .  Smoker 02/12/2014    PAST SURGICAL HISTORY: Past Surgical History:  Procedure Laterality Date  . CHOLECYSTECTOMY N/A 08/25/2016   Procedure: LAPAROSCOPIC CHOLECYSTECTOMY;  Surgeon: Abigail MiyamotoBlackman, Douglas, MD;  [facility];  Service: General;  Laterality: N/A;  . TUBAL LIGATION  02/1993    FAMILY HISTORY: Family History  Problem Relation Age of Onset  . Hypertension Mother   . Cancer Mother   . Colon polyps Mother   . Healthy Sister   . Healthy Sister   . Healthy Sister   . Healthy Sister   . Healthy Son   .  Healthy Son   . Healthy Daughter   . Colon cancer Neg Hx   . Esophageal cancer Neg Hx   . Rectal cancer Neg Hx   . Stomach cancer Neg Hx     SOCIAL HISTORY: Social History   Socioeconomic History  . Marital status: Single    Spouse name: Not on file  . Number of children: 3  . Years of education: Not on file  . Highest education level: High school graduate  Occupational History  . 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 Smoking status: Former Smoker    Packs/day: 1.00    Years: 20.00    Pack years: 20.00    Types: Cigarettes    Quit date: 03/21/2015    Years since quitting: 4.9  . Smokeless tobacco: Never Used  Vaping Use  . Vaping Use: Never used  Substance and Sexual Activity  . Alcohol use: Yes    Alcohol/week: 0.0 standard drinks    Comment: Rare  . Drug use: No  . Sexual activity: Yes    Birth control/protection: Surgical  Other Topics Concern  . Not on file  Social History Narrative   Live with partners [name]-19 years   3 children.    1-[name] (girl) 27 at home with her: 2 grandchildren in her home as well    [name]- 26 expecting   [name]-25      Enjoy: read, play games on tablet, grandkids      Diet: Veggies, does not eat a lot of fried foods, enjoys chicken and fruit   Caffeine: sodas and coffee (with sugar)   Water: Does not drink a lot at all      Wears seat beat   Does not wear sunscreen   Smoke and carbon monoxide detectors   Does not use phone while driving    Social Determinants of Health   Financial Resource Strain:   . Difficulty of Paying Living Expenses: Not on file  Food Insecurity:   . Worried About Programme researcher, broadcasting/film/videounning Out of Food in the Last Year: Not on file  . Ran Out of Food in the Last Year: Not on file  Transportation Needs:   . Lack of Transportation (Medical): Not on file  . Lack of Transportation (Non-Medical): Not on file  Physical Activity:   . Days of Exercise per Week: Not on file  . Minutes of Exercise per Session:  Not on file  Stress:   . Feeling of Stress : Not on file  Social Connections:   . Frequency of Communication with Friends and Family: Not on file  . Frequency of Social Gatherings with Friends and Family: Not on file  . Attends Religious Services: Not on file  . Active Member of Clubs or Organizations: Not on file  . Attends BankerClub or Organization Meetings: Not on file  . Marital Status: Not on file  Intimate Partner  Violence:   . Fear of Current or Ex-Partner: Not on file  . Emotionally Abused: Not on file  . Physically Abused: Not on file  . Sexually Abused: Not on file     PHYSICAL EXAM  GENERAL EXAM/CONSTITUTIONAL: Vitals:  Vitals:   02/18/20 1505  BP: 105/67  Pulse: 66  Weight: 221 lb 3.2 oz (100.3 kg)  Height: 5\' 5"  (1.651 m)     Body mass index is 36.81 kg/m. Wt Readings from Last 3 Encounters:  02/18/20 221 lb 3.2 oz (100.3 kg)  02/05/20 220 lb (99.8 kg)  01/28/20 222 lb (100.7 kg)     Patient is in no distress; well developed, nourished and groomed; neck is supple  CARDIOVASCULAR:  Examination of carotid arteries is normal; no carotid bruits  Regular rate and rhythm, no murmurs  Examination of peripheral vascular system by observation and palpation is normal  EYES:  Ophthalmoscopic exam of optic discs and posterior segments is normal; no papilledema or hemorrhages  No exam data present  MUSCULOSKELETAL:  Gait, strength, tone, movements noted in Neurologic exam below  NEUROLOGIC: MENTAL STATUS:  No flowsheet data found.  awake, alert, oriented to person, place and time  recent and remote memory intact  normal attention and concentration  language fluent, comprehension intact, naming intact  fund of knowledge appropriate  CRANIAL NERVE:   2nd - no papilledema on fundoscopic exam  2nd, 3rd, 4th, 6th - pupils equal and reactive to light, visual fields full to confrontation, extraocular muscles intact, no nystagmus  5th - facial  sensation symmetric  7th - facial strength symmetric  8th - hearing intact  9th - palate elevates symmetrically, uvula midline  11th - shoulder shrug symmetric  12th - tongue protrusion midline  MOTOR:   normal bulk and tone, full strength in the BUE, BLE  SENSORY:   normal and symmetric to light touch, temperature, vibration  COORDINATION:   finger-nose-finger, fine finger movements normal  REFLEXES:   deep tendon reflexes present and symmetric  GAIT/STATION:   narrow based gait     DIAGNOSTIC DATA (LABS, IMAGING, TESTING) - I reviewed patient records, labs, notes, testing and imaging myself where available.  Lab Results  Component Value Date   WBC 13.2 (H) 01/22/2020   HGB 12.1 01/22/2020   HCT 39.7 01/22/2020   MCV 89.2 01/22/2020   PLT 334 01/22/2020      Component Value Date/Time   NA 138 01/22/2020 0819   K 3.7 01/22/2020 0819   CL 106 01/22/2020 0819   CO2 23 01/22/2020 0819   GLUCOSE 125 (H) 01/22/2020 0819   BUN 16 01/22/2020 0819   CREATININE 1.31 (H) 01/22/2020 0819   CREATININE 1.13 (H) 11/08/2019 1026   CALCIUM 9.1 01/22/2020 0819   PROT 6.7 11/08/2019 1026   PROT 6.8 11/08/2019 1026   ALBUMIN 3.9 08/21/2019 1427   AST 21 11/08/2019 1026   ALT 17 11/08/2019 1026   ALKPHOS 80 08/21/2019 1427   BILITOT 0.3 11/08/2019 1026   GFRNONAA 48 (L) 01/22/2020 0819   GFRNONAA 57 (L) 11/08/2019 1026   GFRAA 66 11/08/2019 1026   Lab Results  Component Value Date   CHOL 230 (H) 04/22/2019   HDL 58 04/22/2019   LDLCALC 152 (H) 04/22/2019   TRIG 93 04/22/2019   CHOLHDL 4.0 04/22/2019   Lab Results  Component Value Date   HGBA1C 5.7 (H) 04/22/2019   No results found for: 06/20/2019 Lab Results  Component Value Date   TSH 1.71  04/22/2019       ASSESSMENT AND PLAN  49 y.o. year old female here with intermittent lightheadedness, without specific triggering or aggravating factors.  History of pulmonary sarcoidosis, PE, arthritis, iron  deficiency anemia chronic kidney disease, sleep apnea.  Dx:  1. Lightheadedness   2. Sarcoidosis      PLAN:  INTERMITTENT LIGHTHEADEDNESS (seconds - minutes) - likely due to combination of low BP, menstrual cycle, dehydration, pulmonary sarcoidosis and PE sequelae - check MRI brain w/wo, carotid u/s to rule out other causes - follow-up with PCP, cardiology and pulmonology  Orders Placed This Encounter  Procedures  . MR BRAIN W WO CONTRAST  . VAS US CAROTID   Return for pending if symptoms worsen or fail to improve.    Suanne Marker, MD 02/18/2020, 3:13 PM Certified in Neurology, Neurophysiology and Neuroimaging  [facility] [address], [address] [place], [place] [address] [phone]

## 2020-02-19 ENCOUNTER — Telehealth: Payer: Self-pay | Admitting: Diagnostic Neuroimaging

## 2020-02-19 NOTE — Telephone Encounter (Signed)
Patient returned my call she is scheduled at [facility] for 02/25/20.

## 2020-02-19 NOTE — Telephone Encounter (Signed)
LVM for pt to call back about scheduling mri  [facility] auth: [id number] (exp. 02/19/20 to 04/04/20)

## 2020-02-25 ENCOUNTER — Ambulatory Visit (INDEPENDENT_AMBULATORY_CARE_PROVIDER_SITE_OTHER): Payer: 59

## 2020-02-25 DIAGNOSIS — D869 Sarcoidosis, unspecified: Secondary | ICD-10-CM

## 2020-02-25 MED ORDER — GADOBENATE DIMEGLUMINE 529 MG/ML IV SOLN
20.0000 mL | Freq: Once | INTRAVENOUS | Status: AC | PRN
  Administered 2020-02-25: 20 mL via INTRAVENOUS

## 2020-02-27 ENCOUNTER — Encounter: Payer: Self-pay | Admitting: *Deleted

## 2020-03-02 ENCOUNTER — Ambulatory Visit (HOSPITAL_COMMUNITY)
Admission: RE | Admit: 2020-03-02 | Discharge: 2020-03-02 | Disposition: A | Discharge status: Home / Self Care | Payer: 59 | Source: Ambulatory Visit | Attending: Diagnostic Neuroimaging | Admitting: Diagnostic Neuroimaging

## 2020-03-02 ENCOUNTER — Other Ambulatory Visit: Payer: Self-pay

## 2020-03-02 DIAGNOSIS — R42 Dizziness and giddiness: Secondary | ICD-10-CM | POA: Diagnosis not present

## 2020-03-02 DIAGNOSIS — D869 Sarcoidosis, unspecified: Secondary | ICD-10-CM

## 2020-03-09 ENCOUNTER — Encounter: Payer: Self-pay | Admitting: *Deleted

## 2020-03-10 ENCOUNTER — Other Ambulatory Visit: Payer: Self-pay

## 2020-03-10 DIAGNOSIS — E785 Hyperlipidemia, unspecified: Secondary | ICD-10-CM

## 2020-03-10 MED ORDER — ROSUVASTATIN CALCIUM 5 MG PO TABS: 5.0000 mg | ORAL_TABLET | Freq: Every day | ORAL | 1 refills | Status: DC

## 2020-04-02 NOTE — Progress Notes (Deleted)
Office Visit Note  Patient: Hailey Ross             Date of Birth: 1970-10-13           MRN: [id number]             PCP: Freddy Finner, NP Referring: Freddy Finner, NP Visit Date: 04/16/2020 Occupation: @GUAROCC @  Subjective:  No chief complaint on file.   History of Present Illness: Hailey Ross is a 49 y.o. female ***   Activities of Daily Living:  Patient reports morning stiffness for *** {minute/hour:19697}.   Patient {ACTIONS;DENIES/REPORTS:21021675::"Denies"} nocturnal pain.  Difficulty dressing/grooming: {ACTIONS;DENIES/REPORTS:21021675::"Denies"} Difficulty climbing stairs: {ACTIONS;DENIES/REPORTS:21021675::"Denies"} Difficulty getting out of chair: {ACTIONS;DENIES/REPORTS:21021675::"Denies"} Difficulty using hands for taps, buttons, cutlery, and/or writing: {ACTIONS;DENIES/REPORTS:21021675::"Denies"}  No Rheumatology ROS completed.   PMFS History:  Patient Active Problem List   Diagnosis Date Noted  . Right ankle swelling 02/05/2020  . Encounter for examination following treatment at hospital 01/28/2020  . Palpitations 01/28/2020  . Menorrhagia with regular cycle 10/15/2019  . OSA (obstructive sleep apnea) 10/08/2019  . Right knee pain 08/29/2019  . Positive ANA (antinuclear antibody) 08/29/2019  . Essential hypertension 07/04/2019  . Pain and swelling of right knee 07/04/2019  . Hyperlipidemia 07/04/2019  . Stage 2 chronic kidney disease 07/04/2019  . 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 Encounter for support and coordination of transition of care 07/04/2019  . Daytime hypersomnolence 07/02/2019  . Pulmonary hypertension (HCC) 07/02/2019  . CKD (chronic kidney disease)   . Bilateral pulmonary embolism (HCC) 06/18/2019  . Lightheadedness 05/10/2019  . Vitamin D deficiency 05/10/2019  . Class 2 obesity due to excess calories with body mass index (BMI) of 37.0 to 37.9 in adult 04/16/2019  . Sarcoidosis 05/04/2015  . Bronchiectasis (HCC)  05/04/2015    Past Medical History:  Diagnosis Date  . Acute cholecystitis 08/24/2016  . Acute kidney injury superimposed on chronic kidney disease (HCC) 06/20/2019  . Acute respiratory failure with hypoxia (HCC) 06/19/2019  . Annual visit for general adult medical examination with abnormal findings 04/16/2019  . Arthritis    right knee  . Bronchiectasis (HCC)   . CKD (chronic kidney disease)   . Iron deficiency   . Lightheadedness 05/10/2019  . PE (pulmonary thromboembolism) (HCC) 06/2019  . Pre-syncope 04/16/2019  . Sarcoidosis   . Sarcoidosis   . Sleep apnea    c pap  . Smoker 02/12/2014    Family History  Problem Relation Age of Onset  . Hypertension Mother   . Cancer Mother   . Colon polyps Mother   . Healthy Sister   . Healthy Sister   . Healthy Sister   . Healthy Sister   . Healthy Son   . Healthy Son   . Healthy Daughter   . Colon cancer Neg Hx   . Esophageal cancer Neg Hx   . Rectal cancer Neg Hx   . Stomach cancer Neg Hx    Past Surgical History:  Procedure Laterality Date  . CHOLECYSTECTOMY N/A 08/25/2016   Procedure: LAPAROSCOPIC CHOLECYSTECTOMY;  Surgeon: 08/27/2016, MD;  Location: [facility] OR;  Service: General;  Laterality: N/A;  . TUBAL LIGATION  02/1993   Social History   Social History Narrative   Live with partners [name]-19 years   3 children.    1-[name] (girl) 27 at home with her: 2 grandchildren in her home as well    [name]- 26 expecting   [name]-25      Enjoy: read,  play games on tablet, grandkids      Diet: Veggies, does not eat a lot of fried foods, enjoys chicken and fruit   Caffeine: sodas and coffee (with sugar)   Water: Does not drink a lot at all      Wears seat beat   Does not wear sunscreen   Smoke and carbon monoxide detectors   Does not use phone while driving    Immunization History  Administered Date(s) Administered  . Influenza Inj Mdck Quad Pf 12/25/2019  . Influenza,inj,Quad PF,6+ Mos 12/21/2018  . Influenza,inj,Quad  PF,6-35 Mos 01/10/2019  . PFIZER SARS-COV-2 Vaccination 08/14/2019, 09/10/2019  . Pneumococcal Polysaccharide-23 02/12/2014  . Tdap 02/12/2014     Objective: Vital Signs: There were no vitals taken for this visit.   Physical Exam   Musculoskeletal Exam: ***  CDAI Exam: CDAI Score: -- Patient Global: --; Provider Global: -- Swollen: --; Tender: -- Joint Exam 04/16/2020   No joint exam has been documented for this visit   There is currently no information documented on the homunculus. Go to the Rheumatology activity and complete the homunculus joint exam.  Investigation: No additional findings.  Imaging: No results found.  Recent Labs: Lab Results  Component Value Date   WBC 13.2 (H) 01/22/2020   HGB 12.1 01/22/2020   PLT 334 01/22/2020   NA 138 01/22/2020   K 3.7 01/22/2020   CL 106 01/22/2020   CO2 23 01/22/2020   GLUCOSE 125 (H) 01/22/2020   BUN 16 01/22/2020   CREATININE 1.31 (H) 01/22/2020   BILITOT 0.3 11/08/2019   ALKPHOS 80 08/21/2019   AST 21 11/08/2019   ALT 17 11/08/2019   PROT 6.7 11/08/2019   PROT 6.8 11/08/2019   ALBUMIN 3.9 08/21/2019   CALCIUM 9.1 01/22/2020   GFRAA 66 11/08/2019   QFTBGOLD Negative 05/01/2015   QFTBGOLDPLUS NEGATIVE 11/08/2019    Speciality Comments: No specialty comments available.  Procedures:  No procedures performed Allergies: Patient has no known allergies.   Assessment / Plan:     Visit Diagnoses: No diagnosis found.  Orders: No orders of the defined types were placed in this encounter.  No orders of the defined types were placed in this encounter.   Face-to-face time spent with patient was *** minutes. Greater than 50% of time was spent in counseling and coordination of care.  Follow-Up Instructions: No follow-ups on file.   Ellen Henri, CMA  Note - This record has been created using Animal nutritionist.  Chart creation errors have been sought, but may not always  have been located. Such creation  errors do not reflect on  the standard of medical care.

## 2020-04-16 ENCOUNTER — Ambulatory Visit: Payer: 59 | Admitting: Rheumatology

## 2020-05-11 NOTE — Progress Notes (Signed)
Office Visit Note  Patient: Hailey Ross             Date of Birth: 12-31-1970           MRN: [id number]             PCP: Freddy Finner, NP Referring: Freddy Finner, NP Visit Date: 05/15/2020 Occupation: @GUAROCC @  Subjective:  Right knee pain.   History of Present Illness: Hailey Ross is a 50 y.o. female with a history of pulmonary sarcoidosis.  She states the right knee joint improved after the aspiration.  But now the knee joint has been hurting again.  She has noticed some puffiness on her right ankle joint.  She has had no recurrence of pulmonary sarcoidosis.  Of the other joints are painful.  She denies any history of oral ulcers, nasal ulcers, malar rash, photosensitivity,  or lymphadenopathy.  She denies any shortness of breath.  Activities of Daily Living:  Patient reports morning stiffness for 15-20 minutes.   Patient Denies nocturnal pain.  Difficulty dressing/grooming: Denies Difficulty climbing stairs: Reports Difficulty getting out of chair: Reports Difficulty using hands for taps, buttons, cutlery, and/or writing: Denies  Review of Systems  Constitutional: Negative for fatigue.  HENT: Positive for mouth dryness and nose dryness. Negative for mouth sores.   Eyes: Negative for pain, itching and dryness.  Respiratory: Negative for shortness of breath and difficulty breathing.   Cardiovascular: Negative for chest pain and palpitations.  Gastrointestinal: Negative for blood in stool, constipation and diarrhea.  Endocrine: Negative for increased urination.  Genitourinary: Negative for difficulty urinating.  Musculoskeletal: Positive for arthralgias, joint pain, joint swelling and morning stiffness. Negative for myalgias, muscle tenderness and myalgias.  Skin: Negative for color change, rash and redness.  Allergic/Immunologic: Negative for susceptible to infections.  Neurological: Positive for numbness. Negative for dizziness, headaches, memory loss and  weakness.  Hematological: Negative for bruising/bleeding tendency.  Psychiatric/Behavioral: Negative for confusion and sleep disturbance.    PMFS History:  Patient Active Problem List   Diagnosis Date Noted  . Right ankle swelling 02/05/2020  . Encounter for examination following treatment at hospital 01/28/2020  . Palpitations 01/28/2020  . Menorrhagia with regular cycle 10/15/2019  . OSA (obstructive sleep apnea) 10/08/2019  . Right knee pain 08/29/2019  . Positive ANA (antinuclear antibody) 08/29/2019  . Essential hypertension 07/04/2019  . Pain and swelling of right knee 07/04/2019  . Hyperlipidemia 07/04/2019  . Stage 2 chronic kidney disease 07/04/2019  . 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 Encounter for support and coordination of transition of care 07/04/2019  . Daytime hypersomnolence 07/02/2019  . Pulmonary hypertension (HCC) 07/02/2019  . CKD (chronic kidney disease)   . Bilateral pulmonary embolism (HCC) 06/18/2019  . Lightheadedness 05/10/2019  . Vitamin D deficiency 05/10/2019  . Class 2 obesity due to excess calories with body mass index (BMI) of 37.0 to 37.9 in adult 04/16/2019  . Sarcoidosis 05/04/2015  . Bronchiectasis (HCC) 05/04/2015    Past Medical History:  Diagnosis Date  . Acute cholecystitis 08/24/2016  . Acute kidney injury superimposed on chronic kidney disease (HCC) 06/20/2019  . Acute respiratory failure with hypoxia (HCC) 06/19/2019  . Annual visit for general adult medical examination with abnormal findings 04/16/2019  . Arthritis    right knee  . Bronchiectasis (HCC)   . CKD (chronic kidney disease)   . Iron deficiency   . Lightheadedness 05/10/2019  . PE (pulmonary thromboembolism) (HCC) 06/2019  . Pre-syncope 04/16/2019  .  Sarcoidosis   . Sarcoidosis   . Sleep apnea    c pap  . Smoker 02/12/2014    Family History  Problem Relation Age of Onset  . Hypertension Mother   . Cancer Mother   . Colon polyps Mother   . Healthy Sister   .  Healthy Sister   . Healthy Sister   . Healthy Sister   . Healthy Son   . Healthy Son   . Healthy Daughter   . Colon cancer Neg Hx   . Esophageal cancer Neg Hx   . Rectal cancer Neg Hx   . Stomach cancer Neg Hx    Past Surgical History:  Procedure Laterality Date  . CHOLECYSTECTOMY N/A 08/25/2016   Procedure: LAPAROSCOPIC CHOLECYSTECTOMY;  Surgeon: Abigail Miyamoto, MD;  Location: [facility] OR;  Service: General;  Laterality: N/A;  . TUBAL LIGATION  02/1993   Social History   Social History Narrative   Live with partners [name]-19 years   3 children.    1-[name] (girl) 27 at home with her: 2 grandchildren in her home as well    [name]- 26 expecting   [name]-25      Enjoy: read, play games on tablet, grandkids      Diet: Veggies, does not eat a lot of fried foods, enjoys chicken and fruit   Caffeine: sodas and coffee (with sugar)   Water: Does not drink a lot at all      Wears seat beat   Does not wear sunscreen   Smoke and carbon monoxide detectors   Does not use phone while driving    Immunization History  Administered Date(s) Administered  . Influenza Inj Mdck Quad Pf 12/25/2019  . Influenza,inj,Quad PF,6+ Mos 12/21/2018  . Influenza,inj,Quad PF,6-35 Mos 01/10/2019  . PFIZER(Purple Top)SARS-COV-2 Vaccination 08/14/2019, 09/10/2019, 02/07/2020  . Pneumococcal Polysaccharide-23 02/12/2014  . Tdap 02/12/2014     Objective: Vital Signs: BP 126/80 (BP Location: Left Arm, Patient Position: Sitting, Cuff Size: Normal)   Pulse (!) 58   Resp 15   Ht 5\' 5"  (1.651 m)   Wt 231 lb 9.6 oz (105.1 kg)   BMI 38.54 kg/m    Physical Exam Vitals and nursing note reviewed.  Constitutional:      Appearance: She is well-developed and well-nourished.  HENT:     Head: Normocephalic and atraumatic.  Eyes:     Extraocular Movements: EOM normal.     Conjunctiva/sclera: Conjunctivae normal.  Cardiovascular:     Rate and Rhythm: Normal rate and regular rhythm.     Pulses: Intact distal  pulses.     Heart sounds: Normal heart sounds.  Pulmonary:     Effort: Pulmonary effort is normal.     Breath sounds: Normal breath sounds.  Abdominal:     General: Bowel sounds are normal.     Palpations: Abdomen is soft.  Musculoskeletal:     Cervical back: Normal range of motion.  Lymphadenopathy:     Cervical: No cervical adenopathy.  Skin:    General: Skin is warm and dry.     Capillary Refill: Capillary refill takes less than 2 seconds.  Neurological:     Mental Status: She is alert and oriented to person, place, and time.  Psychiatric:        Mood and Affect: Mood and affect normal.        Behavior: Behavior normal.      Musculoskeletal Exam: C-spine was in good range of motion.  Shoulder joints and elbow joints  with good range of motion.  Wrist joints MCPs PIPs and DIPs with good range of motion with no synovitis.  She had good range of motion of her hip joints.  Warmth and swelling was noted in her right ankle and right knee joint.  Left knee joint had no warmth or swelling.  CDAI Exam: CDAI Score: -- Patient Global: --; Provider Global: -- Swollen: --; Tender: -- Joint Exam 05/15/2020   No joint exam has been documented for this visit   There is currently no information documented on the homunculus. Go to the Rheumatology activity and complete the homunculus joint exam.  Investigation: No additional findings.  Imaging: No results found.  Recent Labs: Lab Results  Component Value Date   WBC 13.2 (H) 01/22/2020   HGB 12.1 01/22/2020   PLT 334 01/22/2020   NA 138 01/22/2020   K 3.7 01/22/2020   CL 106 01/22/2020   CO2 23 01/22/2020   GLUCOSE 125 (H) 01/22/2020   BUN 16 01/22/2020   CREATININE 1.31 (H) 01/22/2020   BILITOT 0.3 11/08/2019   ALKPHOS 80 08/21/2019   AST 21 11/08/2019   ALT 17 11/08/2019   PROT 6.7 11/08/2019   PROT 6.8 11/08/2019   ALBUMIN 3.9 08/21/2019   CALCIUM 9.1 01/22/2020   GFRAA 66 11/08/2019   QFTBGOLD Negative 05/01/2015    QFTBGOLDPLUS NEGATIVE 11/08/2019    Speciality Comments: No specialty comments available.  Procedures:  Large Joint Inj: R knee on 05/15/2020 11:32 AM Indications: pain Details: 27 G 1.5 in needle, medial approach  Arthrogram: No  Medications: 40 mg triamcinolone acetonide 40 MG/ML; 1.5 mL lidocaine 1 % Aspirate: 0 mL Outcome: tolerated well, no immediate complications Procedure, treatment alternatives, risks and benefits explained, specific risks discussed. Consent was given by the patient. Immediately prior to procedure a time out was called to verify the correct patient, procedure, equipment, support staff and site/side marked as required. Patient was prepped and draped in the usual sterile fashion.     Allergies: Patient has no known allergies.   Assessment / Plan:     Visit Diagnoses: Positive ANA (antinuclear antibody) - ANA is low titer positive and persistent.  ENA negative, lupus anticoagulant negative now, beta-2 and anticardiolipin's were negative.  Complements are normal.   Sarcoidosis - History of pulmonary sarcoidosis.  Treated with prednisone for 1-1/2-year by Dr. Craige Cotta.  Effusion, right knee - Treated by Dr. Roda Shutters.  Synovial WBC count was elevated.  MRI showed tiny radial tear of the medial meniscus.  All autoimmune work-up negative.  She has recurrence of right knee joint pain.  She had warmth and swelling on palpation of her right knee joint with small effusion.  After informed consent was obtained right knee joint was injected with cortisone as described above.  She tolerated the procedure well.  I also had detailed discussion with the patient that most likely she has arthritis associated with sarcoidosis.  Indication side effects contraindications of different medications were discussed.  As she has history of chronic renal disease we decided to proceed with Imuran.  Indications side effects contraindications were discussed at length.  The plan is to start on Imuran 50 mg p.o.  daily.  We will check labs in 2 weeks if normal then we will increase Imuran to 75 mg p.o. daily.  We will check labs in 2 weeks after increasing the dose.  Then labs will be monitored in 2 months and every 3 months to monitor for drug toxicity.  The prescription  of Imuran will be sent once the lab results are available.  Medication counseling:  TPMT: Normal Aug 23, 2019 hepatitis B-, hepatitis C negative, HIV negative  Patient was counseled on the purpose, proper use, and adverse effects of azathioprine including risk of infection, nausea, rash, and hair loss.  Reviewed risk of cancer after long term use.  Discussed risk of myelosupression and reviewed importance of frequent lab work to monitor blood counts.  Reviewed drug-drug interactions including contraindication with allopurinol.  Provided patient with educational materials on azathioprine and answered all questions.  Patient consented to azathioprine.  Will upload consent into the media tab.    Primary osteoarthritis of both hands-she had no tenderness on examination today.  Pain in right ankle and joints of right foot - X-rays consistent with osteoarthritis.  She had warmth and swelling in her right ankle joint today.  Bilateral pulmonary embolism (HCC)-she will be on long-term anticoagulation.  Stage 2 chronic kidney disease - followed by Dr. Wolfgang Phoenix.  Bronchiectasis without complication (HCC) - she has chronic cough due to bronchiectasis.  Pulmonary hypertension (HCC) - followed by pulmonary.  Essential hypertension-her blood pressure is normal today.  OSA (obstructive sleep apnea)  Daytime hypersomnolence  Vitamin D deficiency  Secondary hyperparathyroidism (HCC)  Mixed hyperlipidemia  Orders: Orders Placed This Encounter  Procedures  . Large Joint Inj   No orders of the defined types were placed in this encounter.     Follow-Up Instructions: Return in about 6 weeks (around 06/26/2020) for Sarcoidosis,  Osteoarthritis.   Pollyann Savoy, MD  Note - This record has been created using Animal nutritionist.  Chart creation errors have been sought, but may not always  have been located. Such creation errors do not reflect on  the standard of medical care.

## 2020-05-15 ENCOUNTER — Other Ambulatory Visit: Payer: Self-pay

## 2020-05-15 ENCOUNTER — Encounter: Payer: Self-pay | Admitting: Rheumatology

## 2020-05-15 ENCOUNTER — Ambulatory Visit (INDEPENDENT_AMBULATORY_CARE_PROVIDER_SITE_OTHER): Payer: 59 | Admitting: Rheumatology

## 2020-05-15 VITALS — BP 126/80 | HR 58 | Resp 15 | Ht 65.0 in | Wt 231.6 lb

## 2020-05-15 DIAGNOSIS — M19041 Primary osteoarthritis, right hand: Secondary | ICD-10-CM

## 2020-05-15 DIAGNOSIS — Z79899 Other long term (current) drug therapy: Secondary | ICD-10-CM

## 2020-05-15 DIAGNOSIS — M25571 Pain in right ankle and joints of right foot: Secondary | ICD-10-CM

## 2020-05-15 DIAGNOSIS — M25461 Effusion, right knee: Secondary | ICD-10-CM | POA: Diagnosis not present

## 2020-05-15 DIAGNOSIS — R768 Other specified abnormal immunological findings in serum: Secondary | ICD-10-CM

## 2020-05-15 DIAGNOSIS — M19042 Primary osteoarthritis, left hand: Secondary | ICD-10-CM

## 2020-05-15 DIAGNOSIS — I272 Pulmonary hypertension, unspecified: Secondary | ICD-10-CM

## 2020-05-15 DIAGNOSIS — N182 Chronic kidney disease, stage 2 (mild): Secondary | ICD-10-CM

## 2020-05-15 DIAGNOSIS — J479 Bronchiectasis, uncomplicated: Secondary | ICD-10-CM

## 2020-05-15 DIAGNOSIS — E559 Vitamin D deficiency, unspecified: Secondary | ICD-10-CM

## 2020-05-15 DIAGNOSIS — D869 Sarcoidosis, unspecified: Secondary | ICD-10-CM | POA: Diagnosis not present

## 2020-05-15 DIAGNOSIS — G4733 Obstructive sleep apnea (adult) (pediatric): Secondary | ICD-10-CM

## 2020-05-15 DIAGNOSIS — I2699 Other pulmonary embolism without acute cor pulmonale: Secondary | ICD-10-CM

## 2020-05-15 DIAGNOSIS — G4719 Other hypersomnia: Secondary | ICD-10-CM

## 2020-05-15 DIAGNOSIS — N2581 Secondary hyperparathyroidism of renal origin: Secondary | ICD-10-CM

## 2020-05-15 DIAGNOSIS — I10 Essential (primary) hypertension: Secondary | ICD-10-CM

## 2020-05-15 DIAGNOSIS — E782 Mixed hyperlipidemia: Secondary | ICD-10-CM

## 2020-05-15 MED ORDER — LIDOCAINE HCL 1 % IJ SOLN
1.5000 mL | INTRAMUSCULAR | Status: AC | PRN
  Administered 2020-05-15: 1.5 mL

## 2020-05-15 MED ORDER — TRIAMCINOLONE ACETONIDE 40 MG/ML IJ SUSP
40.0000 mg | INTRAMUSCULAR | Status: AC | PRN
  Administered 2020-05-15: 40 mg via INTRA_ARTICULAR

## 2020-05-15 NOTE — Telephone Encounter (Signed)
Pending CBC and CMP, patient will be starting Imuran 50mg  daily x 2 weeks and if labs are stable increase to 75mg  daily, per , PA-C. Thanks!

## 2020-05-15 NOTE — Patient Instructions (Addendum)
Standing Labs We placed an order today for your standing lab work.   Please have your standing labs drawn in 2 weeks x2, 2 months, then every 3 months   If possible, please have your labs drawn 2 weeks prior to your appointment so that the provider can discuss your results at your appointment.  We have open lab daily Monday through Thursday from 8:30-12:30 PM and 1:30-4:30 PM and Friday from 8:30-12:30 PM and 1:30-4:00 PM at the office of Dr. Pollyann Savoy, [facility].   Please be advised, all patients with office appointments requiring lab work will take precedents over walk-in lab work.  If possible, please come for your lab work on Monday and Friday afternoons, as you may experience shorter wait times. The office is located at [address], [address], [place], [place] [address] No appointment is necessary.   Labs are drawn by [facility]. Please bring your co-pay at the time of your lab draw.  You may receive a bill from [facility] for your lab work.  If you wish to have your labs drawn at another location, please call the office 24 hours in advance to send orders.  If you have any questions regarding directions or hours of operation,  please call [phone].   As a reminder, please drink plenty of water prior to coming for your lab work. Thanks!    Azathioprine tablets What is this medicine? AZATHIOPRINE (ay za THYE oh preen) suppresses the immune system. It is used to prevent organ rejection after a transplant. It is also used to treat rheumatoid arthritis. This medicine may be used for other purposes; ask your health care provider or pharmacist if you have questions. COMMON BRAND NAME(S): Azasan, Imuran What should I tell my health care provider before I take this medicine? They need to know if you have any of these conditions:  infection  kidney disease  liver disease  an unusual or allergic reaction to azathioprine, other medicines, lactose, foods, dyes, or  preservatives  pregnant or trying to get pregnant  breast feeding How should I use this medicine? Take this medicine by mouth with a full glass of water. Follow the directions on the prescription label. Take your medicine at regular intervals. Do not take your medicine more often than directed. Continue to take your medicine even if you feel better. Do not stop taking except on your doctor's advice. Talk to your pediatrician regarding the use of this medicine in children. Special care may be needed. Overdosage: If you think you have taken too much of this medicine contact a poison control center or emergency room at once. NOTE: This medicine is only for you. Do not share this medicine with others. What if I miss a dose? If you miss a dose, take it as soon as you can. If it is almost time for your next dose, take only that dose. Do not take double or extra doses. What may interact with this medicine? Do not take this medicine with any of the following medications:  febuxostat  mercaptopurine This medicine may also interact with the following medications:  allopurinol  aminosalicylates like sulfasalazine, mesalamine, balsalazide, and olsalazine  leflunomide  medicines called ACE inhibitors like benazepril, captopril, enalapril, fosinopril, quinapril, lisinopril, ramipril, and trandolapril  mycophenolate  sulfamethoxazole; trimethoprim  vaccines  warfarin This list may not describe all possible interactions. Give your health care provider a list of all the medicines, herbs, non-prescription drugs, or dietary supplements you use. Also tell them if you  smoke, drink alcohol, or use illegal drugs. Some items may interact with your medicine. What should I watch for while using this medicine? Visit your doctor or health care professional for regular checks on your progress. You will need frequent blood checks during the first few months you are receiving the medicine. If you get a cold  or other infection while receiving this medicine, call your doctor or health care professional. Do not treat yourself. The medicine may increase your risk of getting an infection. Women should inform their doctor if they wish to become pregnant or think they might be pregnant. There is a potential for serious side effects to an unborn child. Talk to your health care professional or pharmacist for more information. Men may have a reduced sperm count while they are taking this medicine. Talk to your health care professional for more information. This medicine may increase your risk of getting certain kinds of cancer. Talk to your doctor about healthy lifestyle choices, important screenings, and your risk. What side effects may I notice from receiving this medicine? Side effects that you should report to your doctor or health care professional as soon as possible:  allergic reactions like skin rash, itching or hives, swelling of the face, lips, or tongue  changes in vision  confusion  fever, chills, or any other sign of infection  loss of balance or coordination  severe stomach pain  unusual bleeding, bruising  unusually weak or tired  vomiting  yellowing of the eyes or skin Side effects that usually do not require medical attention (report to your doctor or health care professional if they continue or are bothersome):  hair loss  nausea This list may not describe all possible side effects. Call your doctor for medical advice about side effects. You may report side effects to FDA at 1-800-FDA-1088. Where should I keep my medicine? Keep out of the reach of children. Store at room temperature between 15 and 25 degrees C (59 and 77 degrees F). Protect from light. Throw away any unused medicine after the expiration date. NOTE: This sheet is a summary. It may not cover all possible information. If you have questions about this medicine, talk to your doctor, pharmacist, or health care  provider.  2021 Elsevier/Gold Standard (2013-07-23 12:00:31)

## 2020-05-16 LAB — CBC WITH DIFFERENTIAL/PLATELET
Absolute Monocytes: 686 cells/uL (ref 200–950)
Basophils Absolute: 40 cells/uL (ref 0–200)
Basophils Relative: 0.3 %
Eosinophils Absolute: 264 cells/uL (ref 15–500)
Eosinophils Relative: 2 %
HCT: 37 % (ref 35.0–45.0)
Hemoglobin: 12.1 g/dL (ref 11.7–15.5)
Lymphs Abs: 5848 cells/uL — ABNORMAL HIGH (ref 850–3900)
MCH: 27.9 pg (ref 27.0–33.0)
MCHC: 32.7 g/dL (ref 32.0–36.0)
MCV: 85.3 fL (ref 80.0–100.0)
MPV: 9.8 fL (ref 7.5–12.5)
Monocytes Relative: 5.2 %
Neutro Abs: 6362 cells/uL (ref 1500–7800)
Neutrophils Relative %: 48.2 %
Platelets: 316 10*3/uL (ref 140–400)
RBC: 4.34 10*6/uL (ref 3.80–5.10)
RDW: 12.4 % (ref 11.0–15.0)
Total Lymphocyte: 44.3 %
WBC: 13.2 10*3/uL — ABNORMAL HIGH (ref 3.8–10.8)

## 2020-05-16 LAB — COMPLETE METABOLIC PANEL WITH GFR
AG Ratio: 1.3 (calc) (ref 1.0–2.5)
ALT: 15 U/L (ref 6–29)
AST: 20 U/L (ref 10–35)
Albumin: 3.7 g/dL (ref 3.6–5.1)
Alkaline phosphatase (APISO): 78 U/L (ref 37–153)
BUN/Creatinine Ratio: 15 (calc) (ref 6–22)
BUN: 16 mg/dL (ref 7–25)
CO2: 27 mmol/L (ref 20–32)
Calcium: 8.9 mg/dL (ref 8.6–10.4)
Chloride: 104 mmol/L (ref 98–110)
Creat: 1.07 mg/dL — ABNORMAL HIGH (ref 0.50–1.05)
GFR, Est African American: 70 mL/min/{1.73_m2} (ref >=60)
GFR, Est Non African American: 60 mL/min/{1.73_m2} (ref >=60)
Globulin: 2.8 g/dL (calc) (ref 1.9–3.7)
Glucose, Bld: 73 mg/dL (ref 65–99)
Potassium: 3.9 mmol/L (ref 3.5–5.3)
Sodium: 138 mmol/L (ref 135–146)
Total Bilirubin: 0.4 mg/dL (ref 0.2–1.2)
Total Protein: 6.5 g/dL (ref 6.1–8.1)

## 2020-05-16 LAB — SEDIMENTATION RATE: Sed Rate: 28 mm/h — ABNORMAL HIGH (ref 0–20)

## 2020-05-18 ENCOUNTER — Telehealth: Payer: Self-pay | Admitting: Radiology

## 2020-05-18 NOTE — Progress Notes (Signed)
WBC count remains slightly elevated.  Absolute lymphocytes are elevated.   Creatinine is mildly elevated-1.07. GFR is WNL.  Please advise the patient to avoid taking NSAIDs and to remain hydrated.   Dr. Corliss Skains would like the patient to start on Imuran 50 mg 1 tablet daily for 2 weeks and if labs are stable she will increase to 75 mg daily.

## 2020-05-18 NOTE — Telephone Encounter (Signed)
Error

## 2020-05-18 NOTE — Telephone Encounter (Signed)
Last Visit: 05/15/2020 Next Visit: 06/25/2020 Labs: 05/15/2020 WBC count remains slightly elevated. Absolute lymphocytes are elevated.  Creatinine is mildly elevated-1.07. GFR is WNL. Patient is not taking NSAIDs.   Current Dose per office note 05/15/2020: The plan is to start on Imuran 50 mg p.o. daily.  We will check labs in 2 weeks if normal then we will increase Imuran to 75 mg p.o. daily. DX: Positive ANA (antinuclear antibody)  Okay to fill Imuran?

## 2020-05-19 MED ORDER — AZATHIOPRINE 50 MG PO TABS: ORAL_TABLET | ORAL | 0 refills | Status: DC

## 2020-05-19 MED ORDER — AZATHIOPRINE 50 MG PO TABS: 50.0000 mg | ORAL_TABLET | Freq: Every day | ORAL | 0 refills | Status: DC

## 2020-05-19 NOTE — Addendum Note (Signed)
Addended by: Audrie Lia on: 05/19/2020 10:25 AM   Modules accepted: Orders

## 2020-05-19 NOTE — Addendum Note (Signed)
Addended by: Gearldine Bienenstock on: 05/19/2020 11:43 AM   Modules accepted: Orders

## 2020-05-21 ENCOUNTER — Ambulatory Visit: Payer: 59 | Admitting: Adult Health

## 2020-06-02 ENCOUNTER — Encounter: Payer: Self-pay | Admitting: Cardiology

## 2020-06-02 ENCOUNTER — Ambulatory Visit (INDEPENDENT_AMBULATORY_CARE_PROVIDER_SITE_OTHER): Payer: 59 | Admitting: Cardiology

## 2020-06-02 VITALS — BP 110/80 | HR 64 | Ht 65.0 in | Wt 210.4 lb

## 2020-06-02 DIAGNOSIS — R002 Palpitations: Secondary | ICD-10-CM

## 2020-06-02 DIAGNOSIS — E782 Mixed hyperlipidemia: Secondary | ICD-10-CM | POA: Diagnosis not present

## 2020-06-02 NOTE — Patient Instructions (Signed)

## 2020-06-02 NOTE — Progress Notes (Signed)
Clinical Summary [name] is a 50 y.o.female seen today for follow up of the following medical problems.   1. Palpitations - ER visit 08/17/19 with tachycardia and dizziness. Reported episode of heart racing to 140s while driving. Prior feeling on uneasiness, followed by sudden palpitations. Checked HR on watch and was 144. Episodes lasted about 5 minutes - K 3.8 Cr 1.16 WBC 12 Hgb 12.8 Plt 444 hstrop 18-->17 Ddimer 0.49   -2 total episodes of palpitations. - first episode in 07/2019 while sleeping, awoke her from sleep.  - feeling of heart racing. Walked around for a few minutes, did not resolve.  - came to ER 07/15/19, negative workup including CT PE - episode lasted about 15 minutes in total  - notes on her watch HRs up and down in general - can have minor palpitations at times.   - previously was drinking coffee x 1 day, sodas pepsi cans x 2, sweet tea glasses x 2-3, no energy drinks, no EtOH   - 08/2019 30 day monitor, no significant arrhythmias - started on dilt 30mg  bid for symptoms rare palpitations at times, seem to be associated with her cycle. Dizziness seems to correlate with her cycle as well, dizziness not associated with palpitations.    - no recent symptoms. Has done well since starting diltiazem.    2. 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 CKD 2 - followed by Dr - most recent labs show renal function has been improving.   4. History of PE unprovoked - bilateral PEs in 06/2019, has been on xarelto - repeat CT PE 07/2019 PEs had resolved.  - 06/2019 echo LVEF 60-65^, mild RV dysfunction, PASP 52 at time of PE  - no bleeding on xarelto  5. Pulmonary HTN - noted during echo during PE - repeat study shows PASP had decreased   6. OSA on cpap - mixed compliance   7. Hyperlipidmiea TC 230 HDL 58 TG 93 LDL 152 - upcomiing labs with pcp - compliant with statin  Has had covid shots x 3  Past  Medical History:  Diagnosis Date  . Acute cholecystitis 08/24/2016  . Acute kidney injury superimposed on chronic kidney disease (HCC) 06/20/2019  . Acute respiratory failure with hypoxia (HCC) 06/19/2019  . Annual visit for general adult medical examination with abnormal findings 04/16/2019  . Arthritis    right knee  . Bronchiectasis (HCC)   . CKD (chronic kidney disease)   . Iron deficiency   . Lightheadedness 05/10/2019  . PE (pulmonary thromboembolism) (HCC) 06/2019  . Pre-syncope 04/16/2019  . Sarcoidosis   . Sarcoidosis   . Sleep apnea    c pap  . Smoker 02/12/2014     No Known Allergies   Current Outpatient Medications  Medication Sig Dispense Refill  . acetaminophen (TYLENOL) 500 MG tablet Take 500 mg by mouth every 4 (four) hours as needed for mild pain, moderate pain or headache.     . albuterol (PROAIR HFA) 108 (90 Base) MCG/ACT inhaler Inhale 2 puffs into the lungs every 6 (six) hours as needed for wheezing or shortness of breath. 6.7 g 5  . albuterol (PROVENTIL) (2.5 MG/3ML) 0.083% nebulizer solution Take 3 mLs (2.5 mg total) by nebulization every 6 (six) hours as needed for wheezing or shortness of breath. 75 mL 1  . azaTHIOprine (IMURAN) 50 MG tablet Take 1 tablet (50 mg total) by mouth daily x2 weeks. If labs are stable, increase to  1.5 tablets (75 mg total) by mouth daily. 45 tablet 0  . diltiazem (CARDIZEM) 30 MG tablet TAKE 1 TABLET(30 MG) BY MOUTH TWICE DAILY 180 tablet 1  . lisinopril (ZESTRIL) 2.5 MG tablet Take 2.5 mg by mouth daily.    . pantoprazole (PROTONIX) 40 MG tablet Take 1 tablet (40 mg total) by mouth daily. 90 tablet 3  . rivaroxaban (XARELTO) 20 MG TABS tablet Take 1 tablet (20 mg total) by mouth daily with supper. 90 tablet 2  . rosuvastatin (CRESTOR) 5 MG tablet Take 1 tablet (5 mg total) by mouth daily. 90 tablet 1  . Vitamin D, Ergocalciferol, (DRISDOL) 1.25 MG (50000 UNIT) CAPS capsule Take 1 capsule (50,000 Units total) by mouth every 7 (seven)  days. 12 capsule 1   No current facility-administered medications for this visit.     Past Surgical History:  Procedure Laterality Date  . CHOLECYSTECTOMY N/A 08/25/2016   Procedure: LAPAROSCOPIC CHOLECYSTECTOMY;  Surgeon: Abigail Miyamoto, MD;  Location: [facility] OR;  Service: General;  Laterality: N/A;  . TUBAL LIGATION  02/1993     No Known Allergies    Family History  Problem Relation Age of Onset  . Hypertension Mother   . Cancer Mother   . Colon polyps Mother   . Healthy Sister   . Healthy Sister   . Healthy Sister   . Healthy Sister   . Healthy Son   . Healthy Son   . Healthy Daughter   . Colon cancer Neg Hx   . Esophageal cancer Neg Hx   . Rectal cancer Neg Hx   . Stomach cancer Neg Hx      Social History [name] reports that she quit smoking about 5 years ago. Her smoking use included cigarettes. She has a 20.00 pack-year smoking history. She has never used smokeless tobacco. [name] reports current alcohol use.   Review of Systems CONSTITUTIONAL: No weight loss, fever, chills, weakness or fatigue.  HEENT: Eyes: No visual loss, blurred vision, double vision or yellow sclerae.No hearing loss, sneezing, congestion, runny nose or sore throat.  SKIN: No rash or itching.  CARDIOVASCULAR: per hpi RESPIRATORY: No shortness of breath, cough or sputum.  GASTROINTESTINAL: No anorexia, nausea, vomiting or diarrhea. No abdominal pain or blood.  GENITOURINARY: No burning on urination, no polyuria NEUROLOGICAL: No headache, dizziness, syncope, paralysis, ataxia, numbness or tingling in the extremities. No change in bowel or bladder control.  MUSCULOSKELETAL: No muscle, back pain, joint pain or stiffness.  LYMPHATICS: No enlarged nodes. No history of splenectomy.  PSYCHIATRIC: No history of depression or anxiety.  ENDOCRINOLOGIC: No reports of sweating, cold or heat intolerance. No polyuria or polydipsia.  Marland Kitchen   Physical Examination Today's Vitals   06/02/20 1305   BP: 110/80  Pulse: 64  SpO2: 99%  Weight: 210 lb 6.4 oz (95.4 kg)  Height: 5\' 5"  (1.651 m)   Body mass index is 35.01 kg/m.  Gen: resting comfortably, no acute distress HEENT: no scleral icterus, pupils equal round and reactive, no palptable cervical adenopathy,  CV: RRR, no mr/g, no jvd Resp: Clear to auscultation bilaterally GI: abdomen is soft, non-tender, non-distended, normal bowel sounds, no hepatosplenomegaly MSK: extremities are warm, no edema.  Skin: warm, no rash Neuro:  no focal deficits Psych: appropriate affect   Diagnostic Studies  08/2019 event monitor  30 day event monitor  Min HR 47, Max HR 135, Avg HR 70. Min HR occurred in early AM hours presumably while sleeping  Symptoms correlate with  sinus rhythm  No significant arrhythmias  09/2019 echo IMPRESSIONS    1. Left ventricular ejection fraction, by estimation, is 55 to 60%. The  left ventricle has normal function. The left ventricle has no regional  wall motion abnormalities. Left ventricular diastolic parameters were  normal.  2. Right ventricular systolic function is mildly reduced. The right  ventricular size is moderately enlarged. There is mildly elevated  pulmonary artery systolic pressure. The estimated right ventricular  systolic pressure is 35.3 mmHg.  3. Left atrial size was mildly dilated.  4. Right atrial size was mildly dilated.  5. The mitral valve is normal in structure. No evidence of mitral valve  regurgitation. No evidence of mitral stenosis.  6. Tricuspid valve regurgitation is mild to moderate.  7. The aortic valve is normal in structure. Aortic valve regurgitation is  not visualized. No aortic stenosis is present.  8. The inferior vena cava is normal in size with greater than 50%  respiratory variability, suggesting right atrial pressure of 3 mmHg.        Assessment and Plan   1. Palpitations -benign 30 day monitor - has done well on diiltiazem, continue  current meds  2..Hyperlipidemia - upcoming labs with pcp, continue statin      Antoine Poche, M.D.

## 2020-06-04 ENCOUNTER — Ambulatory Visit (INDEPENDENT_AMBULATORY_CARE_PROVIDER_SITE_OTHER): Payer: 59 | Admitting: Adult Health

## 2020-06-04 ENCOUNTER — Encounter: Payer: Self-pay | Admitting: Adult Health

## 2020-06-04 ENCOUNTER — Other Ambulatory Visit: Payer: Self-pay

## 2020-06-04 DIAGNOSIS — I272 Pulmonary hypertension, unspecified: Secondary | ICD-10-CM

## 2020-06-04 DIAGNOSIS — J479 Bronchiectasis, uncomplicated: Secondary | ICD-10-CM

## 2020-06-04 DIAGNOSIS — G4733 Obstructive sleep apnea (adult) (pediatric): Secondary | ICD-10-CM | POA: Diagnosis not present

## 2020-06-04 DIAGNOSIS — D860 Sarcoidosis of lung: Secondary | ICD-10-CM | POA: Diagnosis not present

## 2020-06-04 NOTE — Assessment & Plan Note (Signed)
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  Patient has been started on Imuran by rheumatology.  Patient has not yet started this.  Have encouraged her to start this as recommended.  Plan  Patient Instructions  Continue on Xarelto 20mg  daily  Avoid NSAID meds , (advil, motrin, etc)  Continue on Flutter valve for cough/congestion .  Advance activity as tolerated.  Need to wear CPAP each night .  Saline nasal spray and gel As needed   Wear for at least 4-6 hrs or more each night .  Do not drive if sleepy .  Work on healthy weight loss .  Continue follow up with Rheumatology .  Start Imuran as recommended.   Follow up in 3  months with Dr. or Octavius Shin NP and As needed

## 2020-06-04 NOTE — Progress Notes (Signed)
Reviewed and agree with assessment/plan.   Coralyn Helling, MD [facility] Pulmonary/Critical Care 06/04/2020, 12:35 PM Pager:  [phone]

## 2020-06-04 NOTE — Patient Instructions (Addendum)
Continue on Xarelto 20mg  daily  Avoid NSAID meds , (advil, motrin, etc)  Continue on Flutter valve for cough/congestion .  Advance activity as tolerated.  Need to wear CPAP each night .  Saline nasal spray and gel As needed   Wear for at least 4-6 hrs or more each night .  Do not drive if sleepy .  Work on healthy weight loss .  Continue follow up with Rheumatology .  Start Imuran as recommended.   Follow up in 3  months with Dr. or Davarius Ridener NP and As needed

## 2020-06-04 NOTE — Assessment & Plan Note (Signed)
Healthy weight loss discussed 

## 2020-06-04 NOTE — Assessment & Plan Note (Signed)
Patient has CPAP noncompliance.  She is encouraged on compliance.  Patient education on sleep apnea and potential complications of untreated sleep apnea along with her pulmonary hypertension.  Patient says she will try to wear her CPAP each night.  Plan  Patient Instructions  Continue on Xarelto 20mg  daily  Avoid NSAID meds , (advil, motrin, etc)  Continue on Flutter valve for cough/congestion .  Advance activity as tolerated.  Need to wear CPAP each night .  Saline nasal spray and gel As needed   Wear for at least 4-6 hrs or more each night .  Do not drive if sleepy .  Work on healthy weight loss .  Continue follow up with Rheumatology .  Start Imuran as recommended.   Follow up in 3  months with Dr. or Keiandra Sullenger NP and As needed

## 2020-06-04 NOTE — Assessment & Plan Note (Addendum)
Appears stable with no flare.  Continue with albuterol as needed.  Flutter valve as needed activity as tolerated PFTs last year did show a slight drop in lung function with more restriction.  Her DLCO was stable. Patient declines starting a maintenance LABA at this time.  Plan  Patient Instructions  Continue on Xarelto 20mg  daily  Avoid NSAID meds , (advil, motrin, etc)  Continue on Flutter valve for cough/congestion .  Advance activity as tolerated.  Need to wear CPAP each night .  Saline nasal spray and gel As needed   Wear for at least 4-6 hrs or more each night .  Do not drive if sleepy .  Work on healthy weight loss .  Continue follow up with Rheumatology .  Start Imuran as recommended.   Follow up in 3  months with Dr. or Kenston Longton NP and As needed

## 2020-06-04 NOTE — Progress Notes (Signed)
@Patient  ID: Hailey Ross, female    DOB: [birth year], 50 y.o.   MRN: [id number]  Chief Complaint  Patient presents with  . Follow-up    Referring provider: Perlie Mayo, NP  HPI: 50 year old female never smoker followed for pulmonary sarcoidosis and bronchiectasis Diagnosed with unprovoked PE on March 2021 on lifelong anticoagulation Medical history significant for chronic kidney disease, hypertension  TEST/EVENTS :  Labs 04/21/15 >> HIV negative, Quantiferon gold negative, ACE 195, ANA, RF negative, ESR 32    Chest imaging:  CT chest 03/18/15 >> biapical thickening with traction BTX, BTX RML and lingula, subcarinal LAN 1.7 cm, patchy increased interstitial markings  HRCT chest 03/07/18 >> borderline LAN, patchy GGO, septal thickening, architectural distortion, cylindrical and varicose BTX  CT chest June 18, 2019 showed peripheral segmental and subsegmental bilateral pulmonary emboli, bulky mediastinal and hilar lymph nodes,bilateral apical and lingular honeycombing and bilateral upper lobe mild traction bronchiectasis. Nonspecific groundglass opacities in the left base and right middle lobe. Not significantly changed   venous Dopplers were negative for DVT.  2D echo showed a preserved EF.  Mildly reduced right ventricular systolic function and moderately elevated pulmonary artery systolic pressure at 57 mmHg.  Moderate grade 3 protruding plaque involving the distal aortic arch and proximal descending aorta  Home sleep study was done July 22, 2019.  This showed mild sleep apnea with an AHI at 9.5/hour and SPO2 low at 74%   PFT 05/26/15 >> FEV1 1.67 (66%), FEV1% 88, TLC 2/78 (53%), DLCO 36% 09/26/2019-pulmonary function test-FEV1 54%, ratio 91, FVC 47%, DLCO 45%   06/04/2020 Follow up : Sarcoidosis, bronchiectasis, sleep apnea, PE Patient returns for a 67-monthfollow-up.  Patient has underlying bronchiectasis and sarcoid.  Says overall breathing is doing well . No  flare in cough , congestion . Not using flutter valve currently . NO change in activity level.  Works fBiochemist, clinical dNetwork engineerjob. No increased dyspnea.  Former smoker .  Patient had repeat pulmonary function testing in June 2021 that showed a drop in lung function with moderate to severe restriction.  DLCO was stable.  We discussed adding in a maintenance bronchodilator-LABA.  Patient declines at this time says that her breathing feels that is doing okay.  She is had no change in her activity level. Patient is recently been seen by rheumatology is felt to have arthritis associated with sarcoidosis.  She has recently been started on Imuran.  She has not started yet. Had family trajedy with daughter dying . She says she will start this soon.   Patient was diagnosed with an unprovoked PE and March 2021.  She is on lifelong anticoagulation with Xarelto.  Remains on Xarelto 226mdaily . She says she is doing well.  No known bleeding.  Follow-up 2D echo showed improvement. PAP decreased from 5763m to 8m42m  Seen by Cardiology in 10/2019 . Cardizem added and recommended to continue on CPAP .   Has CKD followed by Renal. Now on Low dose ACE . No flare in cough .   Patient is underlying sleep apnea.  She was started on CPAP.  Patient says she tries to wear it.  CPAP download shows poor compliance.  Patient education was given on CPAP and sleep apnea.  Also discussed potential complications of untreated sleep apnea.  Also encouraged her on importance of controlled sleep apnea with her underlying pulmonary hypertension.  No Known Allergies  Immunization History  Administered Date(s) Administered  . Influenza Inj Mdck Quad Pf 12/25/2019  .  Influenza,inj,Quad PF,6+ Mos 12/21/2018  . Influenza,inj,Quad PF,6-35 Mos 01/10/2019  . PFIZER(Purple Top)SARS-COV-2 Vaccination 08/14/2019, 09/10/2019, 02/07/2020  . Pneumococcal Polysaccharide-23 02/12/2014  . Tdap 02/12/2014    Past Medical History:  Diagnosis Date   . Acute cholecystitis 08/24/2016  . Acute kidney injury superimposed on chronic kidney disease ([place]) 06/20/2019  . Acute respiratory failure with hypoxia ([place]) 06/19/2019  . Annual visit for general adult medical examination with abnormal findings 04/16/2019  . Arthritis    right knee  . Bronchiectasis ([place])   . CKD (chronic kidney disease)   . Iron deficiency   . Lightheadedness 05/10/2019  . PE (pulmonary thromboembolism) ([place]) 06/2019  . Pre-syncope 04/16/2019  . Sarcoidosis   . Sarcoidosis   . Sleep apnea    c pap  . Smoker 02/12/2014    Tobacco History: Social History   Tobacco Use  Smoking Status Former Smoker  . Packs/day: 1.00  . Years: 20.00  . Pack years: 20.00  . Types: Cigarettes  . Quit date: 03/21/2015  . Years since quitting: 5.2  Smokeless Tobacco Never Used   Counseling given: Not Answered   Outpatient Medications Prior to Visit  Medication Sig Dispense Refill  . acetaminophen (TYLENOL) 500 MG tablet Take 500 mg by mouth every 4 (four) hours as needed for mild pain, moderate pain or headache.     . albuterol (PROAIR HFA) 108 (90 Base) MCG/ACT inhaler Inhale 2 puffs into the lungs every 6 (six) hours as needed for wheezing or shortness of breath. 6.7 g 5  . albuterol (PROVENTIL) (2.5 MG/3ML) 0.083% nebulizer solution Take 3 mLs (2.5 mg total) by nebulization every 6 (six) hours as needed for wheezing or shortness of breath. 75 mL 1  . azaTHIOprine (IMURAN) 50 MG tablet Take 1 tablet (50 mg total) by mouth daily x2 weeks. If labs are stable, increase to 1.5 tablets (75 mg total) by mouth daily. 45 tablet 0  . diltiazem (CARDIZEM) 30 MG tablet TAKE 1 TABLET(30 MG) BY MOUTH TWICE DAILY 180 tablet 1  . lisinopril (ZESTRIL) 2.5 MG tablet Take 2.5 mg by mouth daily.    . pantoprazole (PROTONIX) 40 MG tablet Take 1 tablet (40 mg total) by mouth daily. 90 tablet 3  . rivaroxaban (XARELTO) 20 MG TABS tablet Take 1 tablet (20 mg total) by mouth daily with supper. 90 tablet  2  . rosuvastatin (CRESTOR) 5 MG tablet Take 1 tablet (5 mg total) by mouth daily. 90 tablet 1  . Vitamin D, Ergocalciferol, (DRISDOL) 1.25 MG (50000 UNIT) CAPS capsule Take 1 capsule (50,000 Units total) by mouth every 7 (seven) days. 12 capsule 1   No facility-administered medications prior to visit.     Review of Systems:   Constitutional:   No  weight loss, night sweats,  Fevers, chills,  +fatigue, or  lassitude.  HEENT:   No headaches,  Difficulty swallowing,  Tooth/dental problems, or  Sore throat,                No sneezing, itching, ear ache, nasal congestion, post nasal drip,   CV:  No chest pain,  Orthopnea, PND, swelling in lower extremities, anasarca, dizziness, palpitations, syncope.   GI  No heartburn, indigestion, abdominal pain, nausea, vomiting, diarrhea, change in bowel habits, loss of appetite, bloody stools.   Resp:    No chest wall deformity  Skin: no rash or lesions.  GU: no dysuria, change in color of urine, no urgency or frequency.  No flank pain, no hematuria  MS: + joint pain /swelling.  No decreased range of motion.  No back pain.    Physical Exam  BP 112/78 (BP Location: Left Arm, Cuff Size: Normal)   Pulse (!) 58   Temp 98 F (36.7 C)   Ht 5' 5"  (1.651 m)   Wt 209 lb (94.8 kg)   SpO2 96%   BMI 34.78 kg/m   GEN: A/Ox3; pleasant , NAD, well nourished    HEENT:  /AT,  NOSE-clear, THROAT-clear, no lesions, no postnasal drip or exudate noted.   NECK:  Supple w/ fair ROM; no JVD; normal carotid impulses w/o bruits; no thyromegaly or nodules palpated; no lymphadenopathy.    RESP  Clear  P & A; w/o, wheezes/ rales/ or rhonchi. no accessory muscle use, no dullness to percussion  CARD:  RRR, no m/r/g, no peripheral edema, pulses intact, no cyanosis or clubbing.  GI:   Soft & nt; nml bowel sounds; no organomegaly or masses detected.   Musco: Warm bil, no deformities or joint swelling noted.   Neuro: alert, no focal deficits noted.     Skin: Warm, no lesions or rashes    Lab Results:  CBC   BNP No results found for: BNP  ProBNP No results found for: PROBNP   PFT Results Latest Ref Rng & Units 09/26/2019 05/26/2015  FVC-Pre L - 1.78  FVC-Predicted Pre % 47 57  FVC-Post L 1.39 1.88  FVC-Predicted Post % 45 60  Pre FEV1/FVC % % 91 85  Post FEV1/FCV % % 85 88  FEV1-Pre L 1.33 1.51  FEV1-Predicted Pre % 54 59  FEV1-Post L 1.18 1.67  DLCO uncorrected ml/min/mmHg 9.96 9.41  DLCO UNC% % 45 36  DLCO corrected ml/min/mmHg 10.18 9.38  DLCO COR %Predicted % 46 36  DLVA Predicted % 105 73  TLC L 3.17 2.78  TLC % Predicted % 61 53  RV % Predicted % 89 44    No results found for: NITRICOXIDE      Assessment & Plan:   Bronchiectasis (HCC) Appears stable with no flare.  Continue with albuterol as needed.  Flutter valve as needed activity as tolerated PFTs last year did show a slight drop in lung function with more restriction.  Her DLCO was stable. Patient declines starting a maintenance LABA at this time.  Plan  Patient Instructions  Continue on Xarelto 66m daily  Avoid NSAID meds , (advil, motrin, etc)  Continue on Flutter valve for cough/congestion .  Advance activity as tolerated.  Need to wear CPAP each night .  Saline nasal spray and gel As needed   Wear for at least 4-6 hrs or more each night .  Do not drive if sleepy .  Work on healthy weight loss .  Continue follow up with Rheumatology .  Start Imuran as recommended.   Follow up in 3  months with Dr. SHalford Chessmanor Samual Beals NP and As needed        Pulmonary hypertension ([place] Mild to moderate pulmonary hypertension.  Pulmonary artery pressures have decreased since PE in March 2021.  2D echo was repeated in June that showed decreased pulmonary artery pressure down to 35.  Patient does have underlying obstructive sleep apnea.  Have encouraged her to be CPAP compliant.  Continue follow-up with cardiology.  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  Patient has been started on Imuran by rheumatology.  Patient has not yet started  this.  Have encouraged her to start this as recommended.  Plan  Patient Instructions  Continue on Xarelto 70m daily  Avoid NSAID meds , (advil, motrin, etc)  Continue on Flutter valve for cough/congestion .  Advance activity as tolerated.  Need to wear CPAP each night .  Saline nasal spray and gel As needed   Wear for at least 4-6 hrs or more each night .  Do not drive if sleepy .  Work on healthy weight loss .  Continue follow up with Rheumatology .  Start Imuran as recommended.   Follow up in 3  months with Dr. SHalford Chessmanor Theresa Wedel NP and As needed        OSA (obstructive sleep apnea) Patient has CPAP noncompliance.  She is encouraged on compliance.  Patient education on sleep apnea and potential complications of untreated sleep apnea along with her pulmonary hypertension.  Patient says she will try to wear her CPAP each night.  Plan  Patient Instructions  Continue on Xarelto 282mdaily  Avoid NSAID meds , (advil, motrin, etc)  Continue on Flutter valve for cough/congestion .  Advance activity as tolerated.  Need to wear CPAP each night .  Saline nasal spray and gel As needed   Wear for at least 4-6 hrs or more each night .  Do not drive if sleepy .  Work on healthy weight loss .  Continue follow up with Rheumatology .  Start Imuran as recommended.   Follow up in 3  months with Dr. SoHalford Chessmanr Tiwan Schnitker NP and As needed        Class 2 obesity due to excess calories with body mass index (BMI) of 37.0 to 37.9 in adult Healthy weight loss discussed     TaRexene EdisonNP 06/04/2020

## 2020-06-04 NOTE — Assessment & Plan Note (Signed)
Mild to moderate pulmonary hypertension.  Pulmonary artery pressures have decreased since PE in March 2021.  2D echo was repeated in June that showed decreased pulmonary artery pressure down to 35.  Patient does have underlying obstructive sleep apnea.  Have encouraged her to be CPAP compliant.  Continue follow-up with cardiology.

## 2020-06-08 ENCOUNTER — Other Ambulatory Visit: Payer: Self-pay | Admitting: *Deleted

## 2020-06-08 ENCOUNTER — Other Ambulatory Visit: Payer: Self-pay | Admitting: Physician Assistant

## 2020-06-08 ENCOUNTER — Telehealth: Payer: Self-pay

## 2020-06-08 ENCOUNTER — Other Ambulatory Visit: Payer: Self-pay

## 2020-06-08 DIAGNOSIS — E785 Hyperlipidemia, unspecified: Secondary | ICD-10-CM

## 2020-06-08 DIAGNOSIS — R768 Other specified abnormal immunological findings in serum: Secondary | ICD-10-CM

## 2020-06-08 MED ORDER — AZATHIOPRINE 50 MG PO TABS: ORAL_TABLET | ORAL | 0 refills | Status: DC

## 2020-06-08 MED ORDER — ROSUVASTATIN CALCIUM 5 MG PO TABS: 5.0000 mg | ORAL_TABLET | Freq: Every day | ORAL | 1 refills | Status: DC

## 2020-06-08 NOTE — Telephone Encounter (Signed)
Spoke with the pharmacy and the pharmacist states the prescription was closed out. Pharmacist asked for prescription to resend the prescription. Prescription resent for patient.

## 2020-06-08 NOTE — Telephone Encounter (Signed)
Patient called stating Dr. Corliss Skains prescribed Azathioprine at her appointment on 05/15/20.  Patient states it was sent to [facility], but patient was not able to pick it up.  [facility] states [facility] and was told the prescription was cancelled.  They requested a new prescription and it was declined by our office.  Patient states the pharmacist told her to notify Dr. Corliss Skains and let the office know she never picked up original prescription.

## 2020-06-08 NOTE — Telephone Encounter (Signed)
Last Visit: 05/15/2020 Next Visit: 06/25/2020 Labs: 05/15/2020 WBC count remains slightly elevated. Absolute lymphocytes are elevated.  Creatinine is mildly elevated-1.07. GFR is WNL.  Current Dose per lab note 05/15/2020: Imuran 50 mg 1 tablet daily for 2 weeks and if labs are stable she will increase to 75 mg daily.  DX: Positive ANA (antinuclear antibody)  Last Fill: 05/19/2020  Patient states she has not started the Imuran. Patient states she had a sudden loss of her daughter. Patient states she will start it on 06/09/2020. Patient will have labs 2 weeks after starting the Imuran. Will refuse prescription until patient has started medication.

## 2020-06-09 ENCOUNTER — Telehealth: Payer: Self-pay

## 2020-06-09 ENCOUNTER — Ambulatory Visit (INDEPENDENT_AMBULATORY_CARE_PROVIDER_SITE_OTHER): Payer: 59 | Admitting: Nurse Practitioner

## 2020-06-09 ENCOUNTER — Other Ambulatory Visit: Payer: Self-pay

## 2020-06-09 ENCOUNTER — Encounter: Payer: Self-pay | Admitting: Nurse Practitioner

## 2020-06-09 DIAGNOSIS — F4321 Adjustment disorder with depressed mood: Secondary | ICD-10-CM

## 2020-06-09 DIAGNOSIS — F5104 Psychophysiologic insomnia: Secondary | ICD-10-CM

## 2020-06-09 DIAGNOSIS — G4700 Insomnia, unspecified: Secondary | ICD-10-CM | POA: Insufficient documentation

## 2020-06-09 DIAGNOSIS — Z634 Disappearance and death of family member: Secondary | ICD-10-CM

## 2020-06-09 MED ORDER — TRAZODONE HCL 50 MG PO TABS: 25.0000 mg | ORAL_TABLET | Freq: Every evening | ORAL | 3 refills | Status: DC | PRN

## 2020-06-09 NOTE — Assessment & Plan Note (Signed)
-  Rx. trazodone 

## 2020-06-09 NOTE — Assessment & Plan Note (Signed)
-  daughter murdered 05/20/20; she has custody of her daughter's 2 children -unable to return to work, partly because she has so many tasks and new responsibilities that she is struggling to adjust to and partly because she was at work when she found out her daughter had died and has difficulty going back in the building

## 2020-06-09 NOTE — Telephone Encounter (Signed)
Patient called needs another out of work note if you could add "that she is not capable of performing her duties at work"

## 2020-06-09 NOTE — Patient Instructions (Signed)
I sent in trazodone to help you sleep. Take it when you are getting ready for bed. If you feel tired, go to sleep, don't fight the medication.  If you feel groggy in the morning, cut the pill in half.  For group counseling sessions, you may try [facility]. An internet search will show you local meetings and times when people meet up.

## 2020-06-09 NOTE — Progress Notes (Signed)
Acute Office Visit  Subjective:    Patient ID: Hailey Ross, female    DOB: 22-Jul-1970, 50 y.o.   MRN: [id number]  Chief Complaint  Patient presents with  . Anxiety    X 3 weeks   . Depression    X 3 weeks.     HPI Patient is in today for anxiety and depression that started 3 weeks ago after her daughter was killed. She now has custody of her 2 grandchildren who at 75 and 7.  She needs a work to stay out of work. She is starting grief counseling. She states that she was at work when she found out that he daughter died, and she is upset thinking about returning to work. She is overwhelmed by the amount of responsibility that she has added to her.  She has grief counseling coming up.  She states she hasn't slept since 05/20/20 when she found out about her daughter.  Past Medical History:  Diagnosis Date  . Acute cholecystitis 08/24/2016  . Acute kidney injury superimposed on chronic kidney disease (HCC) 06/20/2019  . Acute respiratory failure with hypoxia (HCC) 06/19/2019  . Annual visit for general adult medical examination with abnormal findings 04/16/2019  . Arthritis    right knee  . Bronchiectasis (HCC)   . CKD (chronic kidney disease)   . Iron deficiency   . Lightheadedness 05/10/2019  . PE (pulmonary thromboembolism) (HCC) 06/2019  . Pre-syncope 04/16/2019  . Sarcoidosis   . Sarcoidosis   . Sleep apnea    c pap  . Smoker 02/12/2014    Past Surgical History:  Procedure Laterality Date  . CHOLECYSTECTOMY N/A 08/25/2016   Procedure: LAPAROSCOPIC CHOLECYSTECTOMY;  Surgeon: Abigail Miyamoto, MD;  Location: [facility];  Service: General;  Laterality: N/A;  . TUBAL LIGATION  02/1993    Family History  Problem Relation Age of Onset  . Hypertension Mother   . Cancer Mother   . Colon polyps Mother   . Healthy Sister   . Healthy Sister   . Healthy Sister   . Healthy Sister   . Healthy Son   . Healthy Son   . Healthy Daughter   . Colon cancer Neg Hx   . Esophageal cancer Neg  Hx   . Rectal cancer Neg Hx   . Stomach cancer Neg Hx     Social History   Socioeconomic History  . Marital status: Single    Spouse name: Not on file  . Number of children: 3  . Years of education: Not on file  . Highest education level: High school graduate  Occupational History  . Occupation: Administrator, [employer] ([employer])  Tobacco Use  . Smoking status: Former Smoker    Packs/day: 1.00    Years: 20.00    Pack years: 20.00    Types: Cigarettes    Quit date: 03/21/2015    Years since quitting: 5.2  . Smokeless tobacco: Never Used  Vaping Use  . Vaping Use: Never used  Substance and Sexual Activity  . Alcohol use: Yes    Alcohol/week: 0.0 standard drinks    Comment: Rare  . Drug use: No  . Sexual activity: Yes    Birth control/protection: Surgical  Other Topics Concern  . Not on file  Social History Narrative   Live with partners [name]-19 years   3 children.    1-[name] (girl) 27 at home with her: 2 grandchildren in her home as well    [name]- 26 expecting   [name]-25  Enjoy: read, play games on tablet, grandkids      Diet: Veggies, does not eat a lot of fried foods, enjoys chicken and fruit   Caffeine: sodas and coffee (with sugar)   Water: Does not drink a lot at all      Wears seat beat   Does not wear sunscreen   Smoke and carbon monoxide detectors   Does not use phone while driving    Social Determinants of Corporate investment banker Strain: Not on file  Food Insecurity: Not on file  Transportation Needs: Not on file  Physical Activity: Not on file  Stress: Not on file  Social Connections: Not on file  Intimate Partner Violence: Not on file    Outpatient Medications Prior to Visit  Medication Sig Dispense Refill  . acetaminophen (TYLENOL) 500 MG tablet Take 500 mg by mouth every 4 (four) hours as needed for mild pain, moderate pain or headache.     . albuterol (PROAIR HFA) 108 (90 Base) MCG/ACT inhaler Inhale 2 puffs into the lungs every 6 (six)  hours as needed for wheezing or shortness of breath. 6.7 g 5  . azaTHIOprine (IMURAN) 50 MG tablet Take 1 tablet (50 mg total) by mouth daily x2 weeks. If labs are stable, increase to 1.5 tablets (75 mg total) by mouth daily. 45 tablet 0  . diltiazem (CARDIZEM) 30 MG tablet TAKE 1 TABLET(30 MG) BY MOUTH TWICE DAILY 180 tablet 1  . lisinopril (ZESTRIL) 2.5 MG tablet Take 2.5 mg by mouth daily.    . pantoprazole (PROTONIX) 40 MG tablet Take 1 tablet (40 mg total) by mouth daily. 90 tablet 3  . rivaroxaban (XARELTO) 20 MG TABS tablet Take 1 tablet (20 mg total) by mouth daily with supper. 90 tablet 2  . rosuvastatin (CRESTOR) 5 MG tablet Take 1 tablet (5 mg total) by mouth daily. 90 tablet 1  . Vitamin D, Ergocalciferol, (DRISDOL) 1.25 MG (50000 UNIT) CAPS capsule Take 1 capsule (50,000 Units total) by mouth every 7 (seven) days. 12 capsule 1  . albuterol (PROVENTIL) (2.5 MG/3ML) 0.083% nebulizer solution Take 3 mLs (2.5 mg total) by nebulization every 6 (six) hours as needed for wheezing or shortness of breath. (Patient not taking: Reported on 06/09/2020) 75 mL 1   No facility-administered medications prior to visit.    No Known Allergies  Review of Systems  Constitutional: Negative.   Psychiatric/Behavioral: Positive for dysphoric mood and sleep disturbance. Negative for self-injury and suicidal ideas. The patient is nervous/anxious.        Objective:    Physical Exam Constitutional:      Appearance: Normal appearance.  Neurological:     Mental Status: She is alert.  Psychiatric:        Behavior: Behavior normal.        Thought Content: Thought content normal.        Judgment: Judgment normal.     Comments: Tearful, daughter was murdered earlier this month     BP 117/79   Pulse (!) 58   Temp 98.5 F (36.9 C)   Resp 18   Ht 5\' 5"  (1.651 m)   Wt 209 lb (94.8 kg)   SpO2 93%   BMI 34.78 kg/m  Wt Readings from Last 3 Encounters:  06/09/20 209 lb (94.8 kg)  06/04/20 209 lb  (94.8 kg)  06/02/20 210 lb 6.4 oz (95.4 kg)    Health Maintenance Due  Topic Date Due  . Hepatitis C Screening  Never  done  . MAMMOGRAM  Never done    There are no preventive care reminders to display for this patient.   Lab Results  Component Value Date   TSH 1.71 04/22/2019   Lab Results  Component Value Date   WBC 13.2 (H) 05/15/2020   HGB 12.1 05/15/2020   HCT 37.0 05/15/2020   MCV 85.3 05/15/2020   PLT 316 05/15/2020   Lab Results  Component Value Date   NA 138 05/15/2020   K 3.9 05/15/2020   CO2 27 05/15/2020   GLUCOSE 73 05/15/2020   BUN 16 05/15/2020   CREATININE 1.07 (H) 05/15/2020   BILITOT 0.4 05/15/2020   ALKPHOS 80 08/21/2019   AST 20 05/15/2020   ALT 15 05/15/2020   PROT 6.5 05/15/2020   ALBUMIN 3.9 08/21/2019   CALCIUM 8.9 05/15/2020   ANIONGAP 9 01/22/2020   GFR 54.82 (L) 02/01/2016   Lab Results  Component Value Date   CHOL 230 (H) 04/22/2019   Lab Results  Component Value Date   HDL 58 04/22/2019   Lab Results  Component Value Date   LDLCALC 152 (H) 04/22/2019   Lab Results  Component Value Date   TRIG 93 04/22/2019   Lab Results  Component Value Date   CHOLHDL 4.0 04/22/2019   Lab Results  Component Value Date   HGBA1C 5.7 (H) 04/22/2019       Assessment & Plan:   Problem List Items Addressed This Visit      Other   Grief at loss of child    -daughter murdered 05/20/20; she has custody of her daughter's 2 children -unable to return to work, partly because she has so many tasks and new responsibilities that she is struggling to adjust to and partly because she was at work when she found out her daughter had died and has difficulty going back in the building      Insomnia    -Rx. trazodone          Meds ordered this encounter  Medications  . traZODone (DESYREL) 50 MG tablet    Sig: Take 0.5-1 tablets (25-50 mg total) by mouth at bedtime as needed for sleep.    Dispense:  30 tablet    Refill:  3     Heather Roberts, NP

## 2020-06-09 NOTE — Telephone Encounter (Signed)
Letter changed, pt informed it is ready for pick up.

## 2020-06-12 NOTE — Progress Notes (Signed)
Office Visit Note  Patient: Hailey Ross             Date of Birth: 1970/06/16           MRN: [id number]             PCP: Freddy Finner, NP Referring: Freddy Finner, NP Visit Date: 06/25/2020 Occupation: @GUAROCC @  Subjective:  Medication monitoring   History of Present Illness: Hailey Ross is a 50 y.o. female with history of sarcoidosis and osteoarthritis.  She was started on Imuran after her last office visit at the end of February 2022.  She is currently taking imuran 50 mg 1 tablets by mouth daily.  She has been tolerating Imuran without any side effects.  She states that her right knee joint pain and swelling resolved after having a cortisone injection performed on 05/15/2020.  She has not had any recurrence of the right knee joint effusion.  She denies any other joint pain or joint swelling at this time.  She denies any new or worsening pulmonary symptoms.  She states that she will experience shortness of breath upon strenuous activities.  She is followed by Dr. 07/13/2020.  She denies any eye pain, redness, or photophobia.  She continues to see her ophthalmologist on a yearly basis.  She denies any swollen lymph nodes.  She has not had any recent rashes.  She denies any new or worsening symptoms.      Activities of Daily Living:  Patient reports morning stiffness for 0 minutes.   Patient Denies nocturnal pain.  Difficulty dressing/grooming: Denies Difficulty climbing stairs: Denies Difficulty getting out of chair: Denies Difficulty using hands for taps, buttons, cutlery, and/or writing: Denies  Review of Systems  Constitutional: Positive for fatigue.  HENT: Positive for nose dryness. Negative for mouth sores and mouth dryness.   Eyes: Negative for pain, itching and dryness.  Respiratory: Positive for cough and wheezing. Negative for shortness of breath and difficulty breathing.   Cardiovascular: Negative for chest pain and palpitations.  Gastrointestinal: Negative for  blood in stool, constipation and diarrhea.  Endocrine: Negative for increased urination.  Genitourinary: Negative for difficulty urinating.  Musculoskeletal: Negative for arthralgias, joint pain, joint swelling, myalgias, morning stiffness, muscle tenderness and myalgias.  Skin: Negative for color change, rash and redness.  Allergic/Immunologic: Negative for susceptible to infections.  Neurological: Negative for dizziness, numbness, headaches, memory loss and weakness.  Hematological: Negative for bruising/bleeding tendency.  Psychiatric/Behavioral: Negative for confusion.    PMFS History:  Patient Active Problem List   Diagnosis Date Noted  . Grief at loss of child 06/09/2020  . Insomnia 06/09/2020  . Right ankle swelling 02/05/2020  . Encounter for examination following treatment at hospital 01/28/2020  . Palpitations 01/28/2020  . Menorrhagia with regular cycle 10/15/2019  . OSA (obstructive sleep apnea) 10/08/2019  . Right knee pain 08/29/2019  . Positive ANA (antinuclear antibody) 08/29/2019  . Essential hypertension 07/04/2019  . Pain and swelling of right knee 07/04/2019  . Hyperlipidemia 07/04/2019  . Stage 2 chronic kidney disease 07/04/2019  . 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 Encounter for support and coordination of transition of care 07/04/2019  . Daytime hypersomnolence 07/02/2019  . Pulmonary hypertension (HCC) 07/02/2019  . CKD (chronic kidney disease)   . Bilateral pulmonary embolism (HCC) 06/18/2019  . Lightheadedness 05/10/2019  . Vitamin D deficiency 05/10/2019  . Class 2 obesity due to excess calories with body mass index (BMI) of 37.0 to 37.9 in adult  04/16/2019  . Sarcoidosis 05/04/2015  . Bronchiectasis (HCC) 05/04/2015    Past Medical History:  Diagnosis Date  . Acute cholecystitis 08/24/2016  . Acute kidney injury superimposed on chronic kidney disease (HCC) 06/20/2019  . Acute respiratory failure with hypoxia (HCC) 06/19/2019  . Annual visit  for general adult medical examination with abnormal findings 04/16/2019  . Arthritis    right knee  . Bronchiectasis (HCC)   . CKD (chronic kidney disease)   . Iron deficiency   . Lightheadedness 05/10/2019  . PE (pulmonary thromboembolism) (HCC) 06/2019  . Pre-syncope 04/16/2019  . Sarcoidosis   . Sarcoidosis   . Sleep apnea    c pap  . Smoker 02/12/2014    Family History  Problem Relation Age of Onset  . Hypertension Mother   . Cancer Mother   . Colon polyps Mother   . Healthy Sister   . Healthy Sister   . Healthy Sister   . Healthy Sister   . Healthy Son   . Healthy Son   . Healthy Daughter   . Colon cancer Neg Hx   . Esophageal cancer Neg Hx   . Rectal cancer Neg Hx   . Stomach cancer Neg Hx    Past Surgical History:  Procedure Laterality Date  . CHOLECYSTECTOMY N/A 08/25/2016   Procedure: LAPAROSCOPIC CHOLECYSTECTOMY;  Surgeon: Abigail MiyamotoBlackman, Douglas, MD;  [facility];  Service: General;  Laterality: N/A;  . TUBAL LIGATION  02/1993   Social History   Social History Narrative   Live with partners [name]-19 years   3 children.    1-[name] (girl) 27 at home with her: 2 grandchildren in her home as well    [name]- 26 expecting   [name]-25      Enjoy: read, play games on tablet, grandkids      Diet: Veggies, does not eat a lot of fried foods, enjoys chicken and fruit   Caffeine: sodas and coffee (with sugar)   Water: Does not drink a lot at all      Wears seat beat   Does not wear sunscreen   Smoke and carbon monoxide detectors   Does not use phone while driving    Immunization History  Administered Date(s) Administered  . Influenza Inj Mdck Quad Pf 12/25/2019  . Influenza,inj,Quad PF,6+ Mos 12/21/2018  . Influenza,inj,Quad PF,6-35 Mos 01/10/2019  . PFIZER(Purple Top)SARS-COV-2 Vaccination 08/14/2019, 09/10/2019, 02/07/2020  . Pneumococcal Polysaccharide-23 02/12/2014  . Tdap 02/12/2014     Objective: Vital Signs: BP 117/77 (BP Location: Right Arm, Patient  Position: Sitting, Cuff Size: Normal)   Pulse 60   Resp 14   Ht 5\' 5"  (1.651 m)   Wt 213 lb 9.6 oz (96.9 kg)   BMI 35.54 kg/m    Physical Exam Vitals and nursing note reviewed.  Constitutional:      Appearance: She is well-developed.  HENT:     Head: Normocephalic and atraumatic.  Eyes:     Conjunctiva/sclera: Conjunctivae normal.  Pulmonary:     Effort: Pulmonary effort is normal.  Abdominal:     Palpations: Abdomen is soft.  Musculoskeletal:     Cervical back: Normal range of motion.  Skin:    General: Skin is warm and dry.     Capillary Refill: Capillary refill takes less than 2 seconds.  Neurological:     Mental Status: She is alert and oriented to person, place, and time.  Psychiatric:        Behavior: Behavior normal.  Musculoskeletal Exam: C-spine, thoracic spine, lumbar spine good range of motion with no discomfort.  Shoulder joints, elbow joints, wrist joints, MCPs, PIPs, DIPs have good range of motion with no synovitis.  She has complete fist formation bilaterally.  Hip joints have good range of motion with no discomfort.  No tenderness over trochanteric bursa bilaterally.  Knee joints have good range of motion with no warmth or effusion.  Ankle joints have good range of motion with no tenderness or inflammation.  CDAI Exam: CDAI Score: -- Patient Global: --; Provider Global: -- Swollen: --; Tender: -- Joint Exam 06/25/2020   No joint exam has been documented for this visit   There is currently no information documented on the homunculus. Go to the Rheumatology activity and complete the homunculus joint exam.  Investigation: No additional findings.  Imaging: No results found.  Recent Labs: Lab Results  Component Value Date   WBC 13.2 (H) 05/15/2020   HGB 12.1 05/15/2020   PLT 316 05/15/2020   NA 138 05/15/2020   K 3.9 05/15/2020   CL 104 05/15/2020   CO2 27 05/15/2020   GLUCOSE 73 05/15/2020   BUN 16 05/15/2020   CREATININE 1.07 (H)  05/15/2020   BILITOT 0.4 05/15/2020   ALKPHOS 80 08/21/2019   AST 20 05/15/2020   ALT 15 05/15/2020   PROT 6.5 05/15/2020   ALBUMIN 3.9 08/21/2019   CALCIUM 8.9 05/15/2020   GFRAA 70 05/15/2020   QFTBGOLD Negative 05/01/2015   QFTBGOLDPLUS NEGATIVE 11/08/2019    Speciality Comments: No specialty comments available.  Procedures:  No procedures performed Allergies: Patient has no known allergies.   Assessment / Plan:     Visit Diagnoses: Sarcoidosis - History of pulmonary sarcoidosis.  Treated with prednisone for 1-1/2-year by Dr. Craige Cotta. No recurrence of pulmonary sarcoidosis.  Chest x-ray ordered on 01/22/2020 revealed stable appearance of fibrotic changes in chest with apical predominance.  She has not developed any new or worsening pulmonary symptoms.  She recently had a flare of inflammatory arthritis associated with sarcoidosis, and she presented with a right knee effusion at her last OV on 05/15/20.  She underwent a right knee joint cortisone injection which has resolved her joint pain and effusion. The plan was to start her on Imuran to manage inflammatory arthritis associated with sarcoidosis. She was [facility] at [facility] on 06/04/20 by Rubye Oaks, NP who felt the patient's pulmonary sarcoidosis was stable. She is currently taking Imuran 50 mg 1 tablet by mouth daily which was started after her last office visit at the end of February 2022.  She has been tolerating Imuran without any side effects.  She requires updated lab work today.  Orders for CBC and CMP were released.  If her lab work is stable the plan is to increase Imuran to 50 mg 1-1/2 tablet by mouth daily.  She will return for lab work 2 weeks after increasing the dose of Imuran.  She voiced understanding.  Standing orders for CBC and CMP were placed today.  She was advised to notify us if she develops any new or worsening symptoms.  She will follow-up in the office in 3 months.  High risk medication use - She was  started on Imuran 50 mg 1 tablet by mouth daily after her last office visit in February 2022.  CBC and CMP will be drawn today and if labs are stable she will increase to Imuran 75 mg daily.  Repeat lab work in 2 weeks, 2 months, then every  3 months.  Standing orders for CBC and CMP were placed today.  - Plan: CBC with Differential/Platelet, COMPLETE METABOLIC PANEL WITH GFR  Positive ANA (antinuclear antibody) - ANA is low titer positive and persistent.  ENA negative, lupus anticoagulant negative now, beta-2 and anticardiolipin's were negative.  Complements are normal.   Effusion, right knee - Treated by Dr. Roda Shutters.  Synovial WBC count was elevated.  MRI showed tiny radial tear of the medial meniscus.  All autoimmune work-up negative.  She had a right knee joint cortisone injection performed on 05/15/2020, which resolved her right knee joint pain and effusion.  She has not had any recurrence of symptoms.  On examination today she has good range of motion of the right knee joint with no discomfort.  No warmth or effusion was noted.  She will continue taking Imuran as prescribed and discussed above.  She was advised to notify us if she has any recurrence of symptoms.    Primary osteoarthritis of both hands:  She has no joint tenderness or inflammation on exam.  She is able to make a complete fist bilaterally.  Other medical conditions are listed as follows:   Bilateral pulmonary embolism (HCC)  Stage 2 chronic kidney disease  Bronchiectasis without complication (HCC)  Pulmonary hypertension (HCC)  Essential hypertension  OSA (obstructive sleep apnea)  Daytime hypersomnolence  Vitamin D deficiency  Secondary hyperparathyroidism (HCC)  Mixed hyperlipidemia  Orders: Orders Placed This Encounter  Procedures  . CBC with Differential/Platelet  . COMPLETE METABOLIC PANEL WITH GFR  . CBC with Differential/Platelet  . COMPLETE METABOLIC PANEL WITH GFR   No orders of the defined types were  placed in this encounter.     Follow-Up Instructions: Return in about 3 months (around 09/25/2020) for Sarcoidosis, Osteoarthritis.   Gearldine Bienenstock, PA-C  Note - This record has been created using Dragon software.  Chart creation errors have been sought, but may not always  have been located. Such creation errors do not reflect on  the standard of medical care.

## 2020-06-16 ENCOUNTER — Telehealth: Payer: Self-pay

## 2020-06-16 ENCOUNTER — Other Ambulatory Visit: Payer: Self-pay | Admitting: Nurse Practitioner

## 2020-06-16 NOTE — Telephone Encounter (Signed)
FMLA forms received  Copied Noted Sleeved  

## 2020-06-18 DIAGNOSIS — F32A Depression, unspecified: Secondary | ICD-10-CM

## 2020-06-18 NOTE — Telephone Encounter (Signed)
Complete  Patient aware  

## 2020-06-23 ENCOUNTER — Other Ambulatory Visit: Payer: Self-pay | Admitting: Family Medicine

## 2020-06-23 ENCOUNTER — Other Ambulatory Visit: Payer: Self-pay

## 2020-06-23 ENCOUNTER — Encounter: Payer: Self-pay | Admitting: Nurse Practitioner

## 2020-06-23 ENCOUNTER — Ambulatory Visit (INDEPENDENT_AMBULATORY_CARE_PROVIDER_SITE_OTHER): Payer: 59 | Admitting: Nurse Practitioner

## 2020-06-23 DIAGNOSIS — Z634 Disappearance and death of family member: Secondary | ICD-10-CM

## 2020-06-23 DIAGNOSIS — E559 Vitamin D deficiency, unspecified: Secondary | ICD-10-CM

## 2020-06-23 DIAGNOSIS — F4321 Adjustment disorder with depressed mood: Secondary | ICD-10-CM | POA: Diagnosis not present

## 2020-06-23 MED ORDER — QUETIAPINE FUMARATE 50 MG PO TABS: 50.0000 mg | ORAL_TABLET | Freq: Every day | ORAL | 1 refills | Status: DC

## 2020-06-23 NOTE — Progress Notes (Signed)
Acute Office Visit  Subjective:    Patient ID: Hailey Ross, female    DOB: 28-Sep-1970, 50 y.o.   MRN: [id number]  Chief Complaint  Patient presents with  . Anxiety    Grief from loss of a child.     HPI Patient is in today for medication check. Her daughter was murdered on 05/20/20, and she had had difficulty coping with the loss of her daughter as well as the assuming the new responsibilities that she has taken on for raising her daughter's children. At her last visit she stated that she had plans to attend grief counseling.  She had an OV on 06/09/20, and she was started on trazodone. She states that her therapist had an emergency, so their session was cancelled, but she has an upcoming appointment on Thursday. She forgot about [facility] group therapy.  She states she still is waking up and having nightmares. She states physically she is doing well, but mentally she may have a breakdown at any time. She has support form her sister, mom, and 2 sons.  She feels like everything falls on her despite having supports.  The grandchildren want the patient for help, and don't go to the other supports.  Past Medical History:  Diagnosis Date  . Acute cholecystitis 08/24/2016  . Acute kidney injury superimposed on chronic kidney disease (HCC) 06/20/2019  . Acute respiratory failure with hypoxia (HCC) 06/19/2019  . Annual visit for general adult medical examination with abnormal findings 04/16/2019  . Arthritis    right knee  . Bronchiectasis (HCC)   . CKD (chronic kidney disease)   . Iron deficiency   . Lightheadedness 05/10/2019  . PE (pulmonary thromboembolism) (HCC) 06/2019  . Pre-syncope 04/16/2019  . Sarcoidosis   . Sarcoidosis   . Sleep apnea    c pap  . Smoker 02/12/2014    Past Surgical History:  Procedure Laterality Date  . CHOLECYSTECTOMY N/A 08/25/2016   Procedure: LAPAROSCOPIC CHOLECYSTECTOMY;  Surgeon: Abigail Miyamoto, MD;  Location: [facility] OR;  Service: General;   Laterality: N/A;  . TUBAL LIGATION  02/1993    Family History  Problem Relation Age of Onset  . Hypertension Mother   . Cancer Mother   . Colon polyps Mother   . Healthy Sister   . Healthy Sister   . Healthy Sister   . Healthy Sister   . Healthy Son   . Healthy Son   . Healthy Daughter   . Colon cancer Neg Hx   . Esophageal cancer Neg Hx   . Rectal cancer Neg Hx   . Stomach cancer Neg Hx     Social History   Socioeconomic History  . Marital status: Single    Spouse name: Not on file  . Number of children: 3  . Years of education: Not on file  . Highest education level: High school graduate  Occupational History  . Occupation: Administrator, [employer] ([employer])  Tobacco Use  . Smoking status: Former Smoker    Packs/day: 1.00    Years: 20.00    Pack years: 20.00    Types: Cigarettes    Quit date: 03/21/2015    Years since quitting: 5.2  . Smokeless tobacco: Never Used  Vaping Use  . Vaping Use: Never used  Substance and Sexual Activity  . Alcohol use: Yes    Alcohol/week: 0.0 standard drinks    Comment: Rare  . Drug use: No  . Sexual activity: Yes    Birth control/protection: Surgical  Other Topics Concern  . Not on file  Social History Narrative   Live with partners [name]-19 years   3 children.    1-[name] (girl) 27 at home with her: 2 grandchildren in her home as well    [name]- 26 expecting   [name]-25      Enjoy: read, play games on tablet, grandkids      Diet: Veggies, does not eat a lot of fried foods, enjoys chicken and fruit   Caffeine: sodas and coffee (with sugar)   Water: Does not drink a lot at all      Wears seat beat   Does not wear sunscreen   Smoke and carbon monoxide detectors   Does not use phone while driving    Social Determinants of Corporate investment banker Strain: Not on file  Food Insecurity: Not on file  Transportation Needs: Not on file  Physical Activity: Not on file  Stress: Not on file  Social Connections: Not on file   Intimate Partner Violence: Not on file    Outpatient Medications Prior to Visit  Medication Sig Dispense Refill  . acetaminophen (TYLENOL) 500 MG tablet Take 500 mg by mouth every 4 (four) hours as needed for mild pain, moderate pain or headache.     . albuterol (PROAIR HFA) 108 (90 Base) MCG/ACT inhaler Inhale 2 puffs into the lungs every 6 (six) hours as needed for wheezing or shortness of breath. 6.7 g 5  . albuterol (PROVENTIL) (2.5 MG/3ML) 0.083% nebulizer solution Take 3 mLs (2.5 mg total) by nebulization every 6 (six) hours as needed for wheezing or shortness of breath. 75 mL 1  . azaTHIOprine (IMURAN) 50 MG tablet Take 1 tablet (50 mg total) by mouth daily x2 weeks. If labs are stable, increase to 1.5 tablets (75 mg total) by mouth daily. 45 tablet 0  . diltiazem (CARDIZEM) 30 MG tablet TAKE 1 TABLET(30 MG) BY MOUTH TWICE DAILY 180 tablet 1  . lisinopril (ZESTRIL) 2.5 MG tablet Take 2.5 mg by mouth daily.    . pantoprazole (PROTONIX) 40 MG tablet Take 1 tablet (40 mg total) by mouth daily. 90 tablet 3  . rivaroxaban (XARELTO) 20 MG TABS tablet Take 1 tablet (20 mg total) by mouth daily with supper. 90 tablet 2  . rosuvastatin (CRESTOR) 5 MG tablet Take 1 tablet (5 mg total) by mouth daily. 90 tablet 1  . Vitamin D, Ergocalciferol, (DRISDOL) 1.25 MG (50000 UNIT) CAPS capsule Take 1 capsule (50,000 Units total) by mouth every 7 (seven) days. 12 capsule 1  . traZODone (DESYREL) 50 MG tablet Take 0.5-1 tablets (25-50 mg total) by mouth at bedtime as needed for sleep. 30 tablet 3   No facility-administered medications prior to visit.    No Known Allergies  Review of Systems  Constitutional: Negative.   Respiratory: Negative.   Cardiovascular: Negative.   Musculoskeletal: Negative.   Psychiatric/Behavioral: Positive for sleep disturbance. Negative for self-injury and suicidal ideas. The patient is nervous/anxious.        Objective:    Physical Exam Constitutional:       Appearance: Normal appearance.  Cardiovascular:     Rate and Rhythm: Normal rate and regular rhythm.     Pulses: Normal pulses.     Heart sounds: Normal heart sounds.  Pulmonary:     Effort: Pulmonary effort is normal.     Breath sounds: Normal breath sounds.  Neurological:     Mental Status: She is alert.  Psychiatric:  Behavior: Behavior normal.        Thought Content: Thought content normal.        Judgment: Judgment normal.     Comments: Anxious affect     BP 119/78   Pulse 64   Temp 98.4 F (36.9 C)   Resp 18   Ht 5\' 5"  (1.651 m)   Wt 210 lb (95.3 kg)   SpO2 92%   BMI 34.95 kg/m  Wt Readings from Last 3 Encounters:  06/23/20 210 lb (95.3 kg)  06/09/20 209 lb (94.8 kg)  06/04/20 209 lb (94.8 kg)    Health Maintenance Due  Topic Date Due  . Hepatitis C Screening  Never done  . MAMMOGRAM  Never done    There are no preventive care reminders to display for this patient.   Lab Results  Component Value Date   TSH 1.71 04/22/2019   Lab Results  Component Value Date   WBC 13.2 (H) 05/15/2020   HGB 12.1 05/15/2020   HCT 37.0 05/15/2020   MCV 85.3 05/15/2020   PLT 316 05/15/2020   Lab Results  Component Value Date   NA 138 05/15/2020   K 3.9 05/15/2020   CO2 27 05/15/2020   GLUCOSE 73 05/15/2020   BUN 16 05/15/2020   CREATININE 1.07 (H) 05/15/2020   BILITOT 0.4 05/15/2020   ALKPHOS 80 08/21/2019   AST 20 05/15/2020   ALT 15 05/15/2020   PROT 6.5 05/15/2020   ALBUMIN 3.9 08/21/2019   CALCIUM 8.9 05/15/2020   ANIONGAP 9 01/22/2020   GFR 54.82 (L) 02/01/2016   Lab Results  Component Value Date   CHOL 230 (H) 04/22/2019   Lab Results  Component Value Date   HDL 58 04/22/2019   Lab Results  Component Value Date   LDLCALC 152 (H) 04/22/2019   Lab Results  Component Value Date   TRIG 93 04/22/2019   Lab Results  Component Value Date   CHOLHDL 4.0 04/22/2019   Lab Results  Component Value Date   HGBA1C 5.7 (H) 04/22/2019        Assessment & Plan:   Problem List Items Addressed This Visit      Other   Grief at loss of child    -she states she doesn't feel like she can go back to work d/t all of the meetings and appointments that 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 Seroquel, STOP trazodone      Relevant Orders   Ambulatory referral to Psychiatry       Meds ordered this encounter  Medications  . DISCONTD: QUEtiapine (SEROQUEL) 50 MG tablet    Sig: Take 1 tablet (50 mg total) by mouth at bedtime.    Dispense:  30 tablet    Refill:  1  . QUEtiapine (SEROQUEL) 50 MG tablet    Sig: Take 1 tablet (50 mg total) by mouth at bedtime.    Dispense:  30 tablet    Refill:  1    Discontinue trazodone today     06/20/2019, NP

## 2020-06-23 NOTE — Patient Instructions (Signed)
Discontinue trazodone. Start quetiapine.

## 2020-06-23 NOTE — Assessment & Plan Note (Signed)
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 Seroquel, STOP trazodone

## 2020-06-25 ENCOUNTER — Encounter: Payer: Self-pay | Admitting: Physician Assistant

## 2020-06-25 ENCOUNTER — Other Ambulatory Visit: Payer: Self-pay

## 2020-06-25 ENCOUNTER — Ambulatory Visit (INDEPENDENT_AMBULATORY_CARE_PROVIDER_SITE_OTHER): Payer: 59 | Admitting: Physician Assistant

## 2020-06-25 VITALS — BP 117/77 | HR 60 | Resp 14 | Ht 65.0 in | Wt 213.6 lb

## 2020-06-25 DIAGNOSIS — E782 Mixed hyperlipidemia: Secondary | ICD-10-CM

## 2020-06-25 DIAGNOSIS — R768 Other specified abnormal immunological findings in serum: Secondary | ICD-10-CM | POA: Diagnosis not present

## 2020-06-25 DIAGNOSIS — I2699 Other pulmonary embolism without acute cor pulmonale: Secondary | ICD-10-CM

## 2020-06-25 DIAGNOSIS — J479 Bronchiectasis, uncomplicated: Secondary | ICD-10-CM

## 2020-06-25 DIAGNOSIS — Z79899 Other long term (current) drug therapy: Secondary | ICD-10-CM

## 2020-06-25 DIAGNOSIS — N2581 Secondary hyperparathyroidism of renal origin: Secondary | ICD-10-CM

## 2020-06-25 DIAGNOSIS — D869 Sarcoidosis, unspecified: Secondary | ICD-10-CM

## 2020-06-25 DIAGNOSIS — G4719 Other hypersomnia: Secondary | ICD-10-CM

## 2020-06-25 DIAGNOSIS — G4733 Obstructive sleep apnea (adult) (pediatric): Secondary | ICD-10-CM

## 2020-06-25 DIAGNOSIS — M19041 Primary osteoarthritis, right hand: Secondary | ICD-10-CM

## 2020-06-25 DIAGNOSIS — M25461 Effusion, right knee: Secondary | ICD-10-CM

## 2020-06-25 DIAGNOSIS — R7689 Other specified abnormal immunological findings in serum: Secondary | ICD-10-CM

## 2020-06-25 DIAGNOSIS — I272 Pulmonary hypertension, unspecified: Secondary | ICD-10-CM

## 2020-06-25 DIAGNOSIS — M19042 Primary osteoarthritis, left hand: Secondary | ICD-10-CM

## 2020-06-25 DIAGNOSIS — E559 Vitamin D deficiency, unspecified: Secondary | ICD-10-CM

## 2020-06-25 DIAGNOSIS — N182 Chronic kidney disease, stage 2 (mild): Secondary | ICD-10-CM

## 2020-06-25 DIAGNOSIS — I10 Essential (primary) hypertension: Secondary | ICD-10-CM

## 2020-06-25 NOTE — Telephone Encounter (Signed)
Pending lab results, Imuran dose will increase to 75mg  by mouth daily per , PA-C. Thanks!

## 2020-06-26 LAB — CBC WITH DIFFERENTIAL/PLATELET
Absolute Monocytes: 605 cells/uL (ref 200–950)
Basophils Absolute: 32 cells/uL (ref 0–200)
Basophils Relative: 0.3 %
Eosinophils Absolute: 108 cells/uL (ref 15–500)
Eosinophils Relative: 1 %
HCT: 36.3 % (ref 35.0–45.0)
Hemoglobin: 12.1 g/dL (ref 11.7–15.5)
Lymphs Abs: 3877 cells/uL (ref 850–3900)
MCH: 28.7 pg (ref 27.0–33.0)
MCHC: 33.3 g/dL (ref 32.0–36.0)
MCV: 86 fL (ref 80.0–100.0)
MPV: 10 fL (ref 7.5–12.5)
Monocytes Relative: 5.6 %
Neutro Abs: 6178 cells/uL (ref 1500–7800)
Neutrophils Relative %: 57.2 %
Platelets: 331 10*3/uL (ref 140–400)
RBC: 4.22 10*6/uL (ref 3.80–5.10)
RDW: 12.6 % (ref 11.0–15.0)
Total Lymphocyte: 35.9 %
WBC: 10.8 10*3/uL (ref 3.8–10.8)

## 2020-06-26 LAB — COMPLETE METABOLIC PANEL WITH GFR
AG Ratio: 1.5 (calc) (ref 1.0–2.5)
ALT: 11 U/L (ref 6–29)
AST: 14 U/L (ref 10–35)
Albumin: 3.8 g/dL (ref 3.6–5.1)
Alkaline phosphatase (APISO): 63 U/L (ref 37–153)
BUN/Creatinine Ratio: 13 (calc) (ref 6–22)
BUN: 14 mg/dL (ref 7–25)
CO2: 27 mmol/L (ref 20–32)
Calcium: 9 mg/dL (ref 8.6–10.4)
Chloride: 105 mmol/L (ref 98–110)
Creat: 1.12 mg/dL — ABNORMAL HIGH (ref 0.50–1.05)
GFR, Est African American: 66 mL/min/{1.73_m2} (ref >=60)
GFR, Est Non African American: 57 mL/min/{1.73_m2} — ABNORMAL LOW (ref >=60)
Globulin: 2.5 g/dL (calc) (ref 1.9–3.7)
Glucose, Bld: 76 mg/dL (ref 65–99)
Potassium: 4.3 mmol/L (ref 3.5–5.3)
Sodium: 140 mmol/L (ref 135–146)
Total Bilirubin: 0.4 mg/dL (ref 0.2–1.2)
Total Protein: 6.3 g/dL (ref 6.1–8.1)

## 2020-06-26 MED ORDER — AZATHIOPRINE 50 MG PO TABS: 75.0000 mg | ORAL_TABLET | Freq: Every day | ORAL | 2 refills | Status: DC

## 2020-06-26 NOTE — Progress Notes (Signed)
Creatinine is borderline elevated-1.12.  GFR WNL.  Please advise the patient to avoid taking NSAIDs and to remain hydrated. Rest of CMP WNL.  CBC WNL.    Ok to increase imuran 50 mg 1.5 tablets daily (75 mg total).

## 2020-06-26 NOTE — Telephone Encounter (Signed)
Labs: Creatinine is borderline elevated-1.12. GFR WNL. Please advise the patient to avoid taking NSAIDs and to remain hydrated. Rest of CMP WNL. CBC WNL.    Ok to increase imuran 50 mg 1.5 tablets daily (75 mg total).   Please review new prescription and send to the pharmacy. Thanks!

## 2020-07-09 ENCOUNTER — Other Ambulatory Visit: Payer: Self-pay | Admitting: Pulmonary Disease

## 2020-07-13 ENCOUNTER — Telehealth: Payer: Self-pay

## 2020-07-13 NOTE — Telephone Encounter (Signed)
Pt is calling she has not heard anything from the referral to psych

## 2020-07-14 ENCOUNTER — Other Ambulatory Visit: Payer: Self-pay

## 2020-07-14 DIAGNOSIS — F4321 Adjustment disorder with depressed mood: Secondary | ICD-10-CM

## 2020-07-14 DIAGNOSIS — Z634 Disappearance and death of family member: Secondary | ICD-10-CM

## 2020-07-14 NOTE — Telephone Encounter (Signed)
Looks like they are not seeing adults at this time. New referral sent to [facility] in [place]. Pt informed.

## 2020-07-27 ENCOUNTER — Other Ambulatory Visit: Payer: Self-pay | Admitting: Nurse Practitioner

## 2020-07-27 ENCOUNTER — Ambulatory Visit (INDEPENDENT_AMBULATORY_CARE_PROVIDER_SITE_OTHER): Payer: 59 | Admitting: Nurse Practitioner

## 2020-07-27 ENCOUNTER — Other Ambulatory Visit: Payer: Self-pay

## 2020-07-27 ENCOUNTER — Encounter: Payer: Self-pay | Admitting: Nurse Practitioner

## 2020-07-27 DIAGNOSIS — F4321 Adjustment disorder with depressed mood: Secondary | ICD-10-CM

## 2020-07-27 DIAGNOSIS — Z634 Disappearance and death of family member: Secondary | ICD-10-CM | POA: Diagnosis not present

## 2020-07-27 MED ORDER — PRAZOSIN HCL 1 MG PO CAPS: 1.0000 mg | ORAL_CAPSULE | Freq: Every day | ORAL | 1 refills | Status: DC

## 2020-07-27 NOTE — Progress Notes (Signed)
Acute Office Visit  Subjective:    Patient ID: Hailey Ross, female    DOB: Oct 15, 1970, 50 y.o.   MRN: [id number]  Chief Complaint  Patient presents with  . Follow-up    HPI Patient is in today for follow-up for grief. At her last OV, she was still grieving the loss of her daughter who was murdered on 05/20/20.  She was having difficulty assuming the responsibilities that she has taken on for raising her daughter's children.  She had plans to go to group therapy with [facility], but she forgot.    At the time of her last OV, she stated that she was waking up and having nightmares. She stated physically she was doing well, but mentally she may have a breakdown at any time. She has support form her sister, mom, and 2 sons.  She feels like everything falls on her despite having supports.  The grandchildren want the patient for help, and don't go to the other supports.  She was supposed to have a counseling session, but that was cancelled.    She started seroquel at her last appointment, but she states she still has nightmares.  She was able to get in with therapy, and has 2 sessions so far, and she has weekly sessions.  She tried Consulting civil engineer and went to one meeting, but no one else showed up. Thursday will be her first meeting.  She states that she is still on bereavement leave from work.  Psychiatry has nt reached out to her for her referral yet.  Past Medical History:  Diagnosis Date  . Acute cholecystitis 08/24/2016  . Acute kidney injury superimposed on chronic kidney disease (HCC) 06/20/2019  . Acute respiratory failure with hypoxia (HCC) 06/19/2019  . Annual visit for general adult medical examination with abnormal findings 04/16/2019  . Arthritis    right knee  . Bronchiectasis (HCC)   . CKD (chronic kidney disease)   . Iron deficiency   . Lightheadedness 05/10/2019  . PE (pulmonary thromboembolism) (HCC) 06/2019  . Pre-syncope 04/16/2019  . Sarcoidosis   . Sarcoidosis   .  Sleep apnea    c pap  . Smoker 02/12/2014    Past Surgical History:  Procedure Laterality Date  . CHOLECYSTECTOMY N/A 08/25/2016   Procedure: LAPAROSCOPIC CHOLECYSTECTOMY;  Surgeon: Abigail Miyamoto, MD;  Location: [facility] OR;  Service: General;  Laterality: N/A;  . TUBAL LIGATION  02/1993    Family History  Problem Relation Age of Onset  . Hypertension Mother   . Cancer Mother   . Colon polyps Mother   . Healthy Sister   . Healthy Sister   . Healthy Sister   . Healthy Sister   . Healthy Son   . Healthy Son   . Healthy Daughter   . Colon cancer Neg Hx   . Esophageal cancer Neg Hx   . Rectal cancer Neg Hx   . Stomach cancer Neg Hx     Social History   Socioeconomic History  . Marital status: Single    Spouse name: Not on file  . Number of children: 3  . Years of education: Not on file  . Highest education level: High school graduate  Occupational History  . Occupation: Administrator, [employer] ([employer])  Tobacco Use  . Smoking status: Former Smoker    Packs/day: 1.00    Years: 20.00    Pack years: 20.00    Types: Cigarettes    Quit date: 03/21/2015    Years since quitting:  5.3  . Smokeless tobacco: Never Used  Vaping Use  . Vaping Use: Never used  Substance and Sexual Activity  . Alcohol use: Yes    Alcohol/week: 0.0 standard drinks    Comment: Rare  . Drug use: No  . Sexual activity: Yes    Birth control/protection: Surgical  Other Topics Concern  . Not on file  Social History Narrative   Live with partners [name]-19 years   3 children.    1-[name] (girl) 27 at home with her: 2 grandchildren in her home as well    [name]- 26 expecting   [name]-25      Enjoy: read, play games on tablet, grandkids      Diet: Veggies, does not eat a lot of fried foods, enjoys chicken and fruit   Caffeine: sodas and coffee (with sugar)   Water: Does not drink a lot at all      Wears seat beat   Does not wear sunscreen   Smoke and carbon monoxide detectors   Does not use phone  while driving    Social Determinants of Corporate investment banker Strain: Not on file  Food Insecurity: Not on file  Transportation Needs: Not on file  Physical Activity: Not on file  Stress: Not on file  Social Connections: Not on file  Intimate Partner Violence: Not on file    Outpatient Medications Prior to Visit  Medication Sig Dispense Refill  . acetaminophen (TYLENOL) 500 MG tablet Take 500 mg by mouth every 4 (four) hours as needed for mild pain, moderate pain or headache.     . albuterol (PROVENTIL) (2.5 MG/3ML) 0.083% nebulizer solution Take 3 mLs (2.5 mg total) by nebulization every 6 (six) hours as needed for wheezing or shortness of breath. 75 mL 1  . albuterol (VENTOLIN HFA) 108 (90 Base) MCG/ACT inhaler INHALE 2 PUFFS INTO THE LUNGS EVERY 6 HOURS AS NEEDED FOR WHEEZING OR SHORTNESS OF BREATH 6.7 g 5  . azaTHIOprine (IMURAN) 50 MG tablet Take 1.5 tablets (75 mg total) by mouth daily. 45 tablet 2  . diltiazem (CARDIZEM) 30 MG tablet TAKE 1 TABLET(30 MG) BY MOUTH TWICE DAILY 180 tablet 1  . lisinopril (ZESTRIL) 2.5 MG tablet Take 2.5 mg by mouth daily.    . pantoprazole (PROTONIX) 40 MG tablet Take 1 tablet (40 mg total) by mouth daily. 90 tablet 3  . QUEtiapine (SEROQUEL) 50 MG tablet Take 1 tablet (50 mg total) by mouth at bedtime. 30 tablet 1  . rivaroxaban (XARELTO) 20 MG TABS tablet Take 1 tablet (20 mg total) by mouth daily with supper. 90 tablet 2  . rosuvastatin (CRESTOR) 5 MG tablet Take 1 tablet (5 mg total) by mouth daily. 90 tablet 1  . Vitamin D, Ergocalciferol, (DRISDOL) 1.25 MG (50000 UNIT) CAPS capsule TAKE 1 CAPSULE BY MOUTH EVERY 7 DAYS 12 capsule 1   No facility-administered medications prior to visit.    No Known Allergies  Review of Systems  Constitutional: Negative.   Respiratory: Negative.   Cardiovascular: Negative.   Psychiatric/Behavioral: Positive for sleep disturbance. Negative for self-injury and suicidal ideas. The patient is  nervous/anxious.        Nightmares wake her up       Objective:    Physical Exam Constitutional:      Appearance: Normal appearance.  Cardiovascular:     Rate and Rhythm: Normal rate and regular rhythm.     Pulses: Normal pulses.     Heart sounds: Normal heart  sounds.  Pulmonary:     Effort: Pulmonary effort is normal.     Breath sounds: Normal breath sounds.  Neurological:     Mental Status: She is alert.  Psychiatric:        Mood and Affect: Mood normal.        Behavior: Behavior normal.        Thought Content: Thought content normal.     Comments: She states she is still unable to return to work, but she has normal affect, behavior, and thoughts today     BP 129/80 (BP Location: Right Arm, Patient Position: Sitting, Cuff Size: Large)   Pulse 73   Temp (!) 97.4 F (36.3 C) (Temporal)   Ht 5\' 5"  (1.651 m)   Wt 217 lb (98.4 kg)   SpO2 97%   BMI 36.11 kg/m  Wt Readings from Last 3 Encounters:  07/27/20 217 lb (98.4 kg)  06/25/20 213 lb 9.6 oz (96.9 kg)  06/23/20 210 lb (95.3 kg)    Health Maintenance Due  Topic Date Due  . Hepatitis C Screening  Never done    There are no preventive care reminders to display for this patient.   Lab Results  Component Value Date   TSH 1.71 04/22/2019   Lab Results  Component Value Date   WBC 10.8 06/25/2020   HGB 12.1 06/25/2020   HCT 36.3 06/25/2020   MCV 86.0 06/25/2020   PLT 331 06/25/2020   Lab Results  Component Value Date   NA 140 06/25/2020   K 4.3 06/25/2020   CO2 27 06/25/2020   GLUCOSE 76 06/25/2020   BUN 14 06/25/2020   CREATININE 1.12 (H) 06/25/2020   BILITOT 0.4 06/25/2020   ALKPHOS 80 08/21/2019   AST 14 06/25/2020   ALT 11 06/25/2020   PROT 6.3 06/25/2020   ALBUMIN 3.9 08/21/2019   CALCIUM 9.0 06/25/2020   ANIONGAP 9 01/22/2020   GFR 54.82 (L) 02/01/2016   Lab Results  Component Value Date   CHOL 230 (H) 04/22/2019   Lab Results  Component Value Date   HDL 58 04/22/2019   Lab Results   Component Value Date   LDLCALC 152 (H) 04/22/2019   Lab Results  Component Value Date   TRIG 93 04/22/2019   Lab Results  Component Value Date   CHOLHDL 4.0 04/22/2019   Lab Results  Component Value Date   HGBA1C 5.7 (H) 04/22/2019       Assessment & Plan:   Problem List Items Addressed This Visit      Other   Grief at loss of child    -sleeping ok with seroquel, but she has nightmares -Rx. Prazosin -she needs to get in with psych; [facility] -we discussed that I am not qualified to treat her PTSD, and any FMLA or disability documents would have to be completed by psychiatry -she may return to work when therapy/psychiatry recommends          Meds ordered this encounter  Medications  . prazosin (MINIPRESS) 1 MG capsule    Sig: Take 1 capsule (1 mg total) by mouth at bedtime.    Dispense:  30 capsule    Refill:  1     06/20/2019, NP

## 2020-07-27 NOTE — Assessment & Plan Note (Signed)
-  sleeping ok with seroquel, but she has nightmares -Rx. Prazosin -she needs to get in with psych; [facility] -we discussed that I am not qualified to treat her PTSD, and any FMLA or disability documents would have to be completed by psychiatry -she may return to work when therapy/psychiatry recommends

## 2020-08-21 ENCOUNTER — Other Ambulatory Visit: Payer: Self-pay | Admitting: Cardiology

## 2020-08-27 ENCOUNTER — Other Ambulatory Visit: Payer: Self-pay | Admitting: Adult Health

## 2020-09-03 ENCOUNTER — Encounter: Payer: Self-pay | Admitting: Adult Health

## 2020-09-03 ENCOUNTER — Ambulatory Visit (INDEPENDENT_AMBULATORY_CARE_PROVIDER_SITE_OTHER): Payer: 59 | Admitting: Adult Health

## 2020-09-03 ENCOUNTER — Other Ambulatory Visit: Payer: Self-pay

## 2020-09-03 DIAGNOSIS — D860 Sarcoidosis of lung: Secondary | ICD-10-CM

## 2020-09-03 DIAGNOSIS — G4733 Obstructive sleep apnea (adult) (pediatric): Secondary | ICD-10-CM

## 2020-09-03 DIAGNOSIS — J479 Bronchiectasis, uncomplicated: Secondary | ICD-10-CM | POA: Diagnosis not present

## 2020-09-03 DIAGNOSIS — I2699 Other pulmonary embolism without acute cor pulmonale: Secondary | ICD-10-CM

## 2020-09-03 NOTE — Assessment & Plan Note (Signed)
CPAP intolerant.  Patient is encouraged on CPAP compliance.

## 2020-09-03 NOTE — Patient Instructions (Addendum)
Continue on Xarelto 20mg  daily.  Avoid NSAID meds , (advil, motrin, etc)  Continue on Flutter valve for cough/congestion .  Advance activity as tolerated.  Try to wear CPAP each night .  Saline nasal spray and gel As needed   Wear for at least 4-6 hrs or more each night .  Do not drive if sleepy .  Work on healthy weight loss .  Continue follow up with Rheumatology .  Follow up in 4 months with Dr. or Adelaide Pfefferkorn NP and As needed

## 2020-09-03 NOTE — Progress Notes (Signed)
@Patient  ID: Hailey Ross, female    DOB: [birth year], 50 y.o.   MRN: [id number]  Chief Complaint  Patient presents with  . Follow-up    Referring provider: Perlie Mayo, NP  HPI: 50 year old female never smoker followed for pulmonary sarcoidosis and bronchiectasis. 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 Medical history significant for chronic kidney disease and hypertension  TEST/EVENTS :  Labs 04/21/15 >> HIV negative, Quantiferon gold negative, ACE 195, ANA, RF negative, ESR 32    Chest imaging:  CT chest 03/18/15 >> biapical thickening with traction BTX, BTX RML and lingula, subcarinal LAN 1.7 cm, patchy increased interstitial markings  HRCT chest 03/07/18 >> borderline LAN, patchy GGO, septal thickening, architectural distortion, cylindrical and varicose BTX  CT chest June 18, 2019 showed peripheral segmental and subsegmental bilateral pulmonary emboli, bulky mediastinal and hilar lymph nodes,bilateral apical and lingular honeycombing and bilateral upper lobe mild traction bronchiectasis. Nonspecific groundglass opacities in the left base and right middle lobe. Not significantly changed   venous Dopplers were negative for DVT. 2D echo showed a preserved EF. Mildly reduced right ventricular systolic function and moderately elevated pulmonary artery systolic pressure at 57 mmHg. Moderate grade 3 protruding plaque involving the distal aortic arch and proximal descending aorta  Echo 09/2019   Home sleep study was done July 22, 2019. This showed mild sleep apnea with an AHI at 9.5/hour and SPO2 low at 74%  PFT 05/26/15 >> FEV1 1.67 (66%), FEV1% 88, TLC 2/78 (53%), DLCO 36% 09/26/2019-pulmonary function test-FEV1 54%, ratio 91, FVC 47%, DLCO 45%    09/03/2020 Follow up: Sarcoid , Bronchiectasis , PE , OSA  Patient returns for a 44-monthfollow-up..  Patient has underlying bronchiectasis and sarcoidosis.  Patient says  overall breathing is doing okay.  She gets short of breath with activities.  No change in her activity tolerance.  Has trouble at her work lifting heavy items often has trouble trying to bend over.  No flare of cough.  She does have a flutter valve but does not use it on a consistent basis.  She does work full-time.  Is independent.  Patient has recently been seen by rheumatology earlier this year.  Felt to have arthritis associated with her sarcoidosis.  Started on Imuran.   Previous unprovoked PE and March 2021.  She is on lifelong anticoagulation with Xarelto.  Denies any known issues.  Patient does have underlying sleep apnea.  Home sleep study in April 2021 showed mild sleep apnea.  Patient has difficulty tolerating her CPAP.  She says she tries to wear it but just does not like it. Struggling with grief and insomnia lately ,hard to wear .   Daughter killed in home invasion. Has her 2 kids to raise. Under a lot of stress.   Covid vaccines x 3 utd.    No Known Allergies  Immunization History  Administered Date(s) Administered  . Influenza Inj Mdck Quad Pf 12/25/2019  . Influenza,inj,Quad PF,6+ Mos 12/21/2018  . Influenza,inj,Quad PF,6-35 Mos 01/10/2019  . PFIZER(Purple Top)SARS-COV-2 Vaccination 08/14/2019, 09/10/2019, 02/07/2020  . Pneumococcal Polysaccharide-23 02/12/2014  . Tdap 02/12/2014    Past Medical History:  Diagnosis Date  . Acute cholecystitis 08/24/2016  . Acute kidney injury superimposed on chronic kidney disease ([facility] 06/20/2019  . Acute respiratory failure with hypoxia ([facility] 06/19/2019  . Annual visit for general adult medical examination with abnormal findings 04/16/2019  . Arthritis    right knee  . Bronchiectasis ([facility]   .  CKD (chronic kidney disease)   . Iron deficiency   . Lightheadedness 05/10/2019  . PE (pulmonary thromboembolism) ([place]) 06/2019  . Pre-syncope 04/16/2019  . Sarcoidosis   . Sarcoidosis   . Sleep apnea    c pap  . Smoker 02/12/2014     Tobacco History: Social History   Tobacco Use  Smoking Status Former Smoker  . Packs/day: 1.00  . Years: 20.00  . Pack years: 20.00  . Types: Cigarettes  . Quit date: 03/21/2015  . Years since quitting: 5.4  Smokeless Tobacco Never Used   Counseling given: Not Answered   Outpatient Medications Prior to Visit  Medication Sig Dispense Refill  . acetaminophen (TYLENOL) 500 MG tablet Take 500 mg by mouth every 4 (four) hours as needed for mild pain, moderate pain or headache.     . albuterol (PROVENTIL) (2.5 MG/3ML) 0.083% nebulizer solution Take 3 mLs (2.5 mg total) by nebulization every 6 (six) hours as needed for wheezing or shortness of breath. 75 mL 1  . albuterol (VENTOLIN HFA) 108 (90 Base) MCG/ACT inhaler INHALE 2 PUFFS INTO THE LUNGS EVERY 6 HOURS AS NEEDED FOR WHEEZING OR SHORTNESS OF BREATH 6.7 g 5  . azaTHIOprine (IMURAN) 50 MG tablet Take 1.5 tablets (75 mg total) by mouth daily. 45 tablet 2  . diltiazem (CARDIZEM) 30 MG tablet TAKE 1 TABLET(30 MG) BY MOUTH TWICE DAILY 180 tablet 3  . lisinopril (ZESTRIL) 2.5 MG tablet Take 2.5 mg by mouth daily.    . pantoprazole (PROTONIX) 40 MG tablet Take 1 tablet (40 mg total) by mouth daily. 90 tablet 3  . prazosin (MINIPRESS) 1 MG capsule TAKE 1 CAPSULE(1 MG) BY MOUTH AT BEDTIME 90 capsule 1  . QUEtiapine (SEROQUEL) 50 MG tablet Take 1 tablet (50 mg total) by mouth at bedtime. 30 tablet 1  . rosuvastatin (CRESTOR) 5 MG tablet Take 1 tablet (5 mg total) by mouth daily. 90 tablet 1  . Vitamin D, Ergocalciferol, (DRISDOL) 1.25 MG (50000 UNIT) CAPS capsule TAKE 1 CAPSULE BY MOUTH EVERY 7 DAYS 12 capsule 1  . XARELTO 20 MG TABS tablet TAKE 1 TABLET(20 MG) BY MOUTH DAILY WITH SUPPER 90 tablet 2   No facility-administered medications prior to visit.     Review of Systems:   Constitutional:   No  weight loss, night sweats,  Fevers, chills, + fatigue, or  lassitude.  HEENT:   No headaches,  Difficulty swallowing,  Tooth/dental  problems, or  Sore throat,                No sneezing, itching, ear ache, nasal congestion, post nasal drip,   CV:  No chest pain,  Orthopnea, PND, swelling in lower extremities, anasarca, dizziness, palpitations, syncope.   GI  No heartburn, indigestion, abdominal pain, nausea, vomiting, diarrhea, change in bowel habits, loss of appetite, bloody stools.   Resp:   No chest wall deformity  Skin: no rash or lesions.  GU: no dysuria, change in color of urine, no urgency or frequency.  No flank pain, no hematuria   MS:  +joint pain    Physical Exam  BP 116/72 (BP Location: Left Arm, Patient Position: Sitting, Cuff Size: Normal)   Pulse 63   Temp 98.3 F (36.8 C) (Temporal)   Ht 5' 5"  (1.651 m)   Wt 213 lb (96.6 kg)   SpO2 95% Comment: RA  BMI 35.45 kg/m   GEN: A/Ox3; pleasant , NAD, well nourished    HEENT:  [place]/AT,  NOSE-clear, THROAT-clear, no lesions, no postnasal drip or exudate noted.   NECK:  Supple w/ fair ROM; no JVD; normal carotid impulses w/o bruits; no thyromegaly or nodules palpated; no lymphadenopathy.     RESP  Clear  P & A; w/o, wheezes/ rales/ or rhonchi. no accessory muscle use, no dullness to percussion  CARD:  RRR, no m/r/g, no peripheral edema, pulses intact, no cyanosis or clubbing.  GI:   Soft & nt; nml bowel sounds; no organomegaly or masses detected.   Musco: Warm bil, no deformities or joint swelling noted.   Neuro: alert, no focal deficits noted.    Skin: Warm, no lesions or rashes    Lab Results:  CBC  BNP No results found for: BNP  ProBNP No results found for: PROBNP  Imaging: No results found.    PFT Results Latest Ref Rng & Units 09/26/2019 05/26/2015  FVC-Pre L - 1.78  FVC-Predicted Pre % 47 57  FVC-Post L 1.39 1.88  FVC-Predicted Post % 45 60  Pre FEV1/FVC % % 91 85  Post FEV1/FCV % % 85 88  FEV1-Pre L 1.33 1.51  FEV1-Predicted Pre % 54 59  FEV1-Post L 1.18 1.67  DLCO uncorrected ml/min/mmHg 9.96 9.41  DLCO UNC% % 45  36  DLCO corrected ml/min/mmHg 10.18 9.38  DLCO COR %Predicted % 46 36  DLVA Predicted % 105 73  TLC L 3.17 2.78  TLC % Predicted % 61 53  RV % Predicted % 89 44    No results found for: NITRICOXIDE      Assessment & Plan:   Sarcoidosis of lung (HCC) Appears stable.  Holdn    Plan  Patient Instructions  Continue on Xarelto 67m daily.  Avoid NSAID meds , (advil, motrin, etc)  Continue on Flutter valve for cough/congestion .  Advance activity as tolerated.  Try to wear CPAP each night .  Saline nasal spray and gel As needed   Wear for at least 4-6 hrs or more each night .  Do not drive if sleepy .  Work on healthy weight loss .  Continue follow up with Rheumatology .  Follow up in 4 months with Dr. SHalford Chessmanor Elisheva Fallas NP and As needed        Bronchiectasis ([facility] Stable without exacerbation.  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  Patient is to continue with Xarelto.  Patient education given  OSA (obstructive sleep apnea) CPAP intolerant.  Patient is encouraged on CPAP compliance.     TRexene Edison NP 09/03/2020

## 2020-09-03 NOTE — Assessment & Plan Note (Signed)
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  Patient is to continue with Xarelto.  Patient education given

## 2020-09-03 NOTE — Assessment & Plan Note (Signed)
Appears stable.  Holdn    Plan  Patient Instructions  Continue on Xarelto 20mg  daily.  Avoid NSAID meds , (advil, motrin, etc)  Continue on Flutter valve for cough/congestion .  Advance activity as tolerated.  Try to wear CPAP each night .  Saline nasal spray and gel As needed   Wear for at least 4-6 hrs or more each night .  Do not drive if sleepy .  Work on healthy weight loss .  Continue follow up with Rheumatology .  Follow up in 4 months with Dr. or Quetzalli Clos NP and As needed

## 2020-09-03 NOTE — Assessment & Plan Note (Signed)
Stable without exacerbation.  Flutter valve as needed

## 2020-09-04 NOTE — Progress Notes (Signed)
Reviewed and agree with assessment/plan.   Coralyn Helling, MD [facility] Pulmonary/Critical Care 09/04/2020, 8:36 AM Pager:  [phone]

## 2020-09-14 ENCOUNTER — Telehealth: Payer: Self-pay

## 2020-09-14 NOTE — Telephone Encounter (Signed)
Patient advised should come in to get CBC with differential and CMP with GFR.  Once the labs are available then we can restart her on Imuran. Patient states she will come into the office this week to update.

## 2020-09-14 NOTE — Telephone Encounter (Signed)
Patient called stating at her last appointment in March she started a new medication Imuran.  Patient states after that appointment she experienced a "tragic loss" and didn't start the medication (Imuran) at that time.  Patient states she eventually started her medication, but hasn't had labwork.  Patient is scheduled for appointment on 09/29/20.  Patient requested a return call to let her know if she needs to keep taking the medication or if she needs to wait until her appointment and restart at that time.

## 2020-09-14 NOTE — Telephone Encounter (Signed)
Attempted to contact the patient and left message for patient to call the office.  

## 2020-09-14 NOTE — Telephone Encounter (Signed)
Patient should come in to get CBC with differential and CMP with GFR.  Once the labs are available then we can restart her on Imuran.

## 2020-09-14 NOTE — Telephone Encounter (Signed)
Patient states she started the Imuran February and states she took the initial prescription but never followed up with lab work due to tragically losing her daughter. Patient states Patient states she has not taken the medication since completing the first prescription. Patient would like to know if she should go ahead and restart the Imuran or wait until she has her appointment on 09/29/2020. Please advise.

## 2020-09-15 NOTE — Progress Notes (Signed)
Office Visit Note  Patient: Hailey Ross             Date of Birth: 29-Dec-1970           MRN: [id number]             PCP: Freddy Finner, NP Referring: Freddy Finner, NP Visit Date: 09/29/2020 Occupation: @GUAROCC @  Subjective:  Other (Patient reports she has not taken imuran in a while )   History of Present Illness: Hailey Ross is a 50 y.o. female with history of sarcoidosis.  She was seen last in February 2022.  She was a started on Imuran due to ongoing pain and inflammation in her knee joint.  She also had a meniscal tear in her right knee joint.  She had positive ANA, ENA was negative.  Her lupus anticoagulant was initially positive but the repeat test was negative.  She took Imuran for 1 month and then discontinued it due to family situation.  She did not notice any difference in her symptoms while she was on Imuran.  She continues to have pain and discomfort in her bilateral knee joints.  She is also having discomfort in her elbows.  She has noticed swelling in her ankle joints on and off.  She was seen by Dr. March 2022 recently and the pulmonary sarcoidosis is quiet.  Activities of Daily Living:  Patient reports morning stiffness for all day. Patient Reports nocturnal pain.  Difficulty dressing/grooming: Denies Difficulty climbing stairs: Denies Difficulty getting out of chair: Denies Difficulty using hands for taps, buttons, cutlery, and/or writing: Denies  Review of Systems  Constitutional:  Positive for fatigue.  HENT:  Positive for nose dryness. Negative for mouth sores and mouth dryness.   Eyes:  Negative for pain, itching and dryness.  Respiratory:  Positive for shortness of breath and difficulty breathing.   Cardiovascular:  Positive for palpitations. Negative for chest pain.  Gastrointestinal:  Negative for blood in stool, constipation and diarrhea.  Endocrine: Negative for increased urination.  Genitourinary:  Negative for difficulty urinating.   Musculoskeletal:  Positive for morning stiffness. Negative for joint pain, joint pain, joint swelling, myalgias, muscle tenderness and myalgias.  Skin:  Negative for color change, rash and redness.  Allergic/Immunologic: Negative for susceptible to infections.  Neurological:  Positive for dizziness and weakness. Negative for numbness, headaches and memory loss.  Hematological:  Negative for bruising/bleeding tendency.  Psychiatric/Behavioral:  Negative for confusion.    PMFS History:  Patient Active Problem List   Diagnosis Date Noted   Grief at loss of child 06/09/2020   Insomnia 06/09/2020   Right ankle swelling 02/05/2020   Encounter for examination following treatment at hospital 01/28/2020   Palpitations 01/28/2020   Menorrhagia with regular cycle 10/15/2019   OSA (obstructive sleep apnea) 10/08/2019   Right knee pain 08/29/2019   Positive ANA (antinuclear antibody) 08/29/2019   Essential hypertension 07/04/2019   Pain and swelling of right knee 07/04/2019   Hyperlipidemia 07/04/2019   Stage 2 chronic kidney disease 07/04/2019   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   Encounter for support and coordination of transition of care 07/04/2019   Daytime hypersomnolence 07/02/2019   Pulmonary hypertension (HCC) 07/02/2019   CKD (chronic kidney disease)    Bilateral pulmonary embolism (HCC) 06/18/2019   Lightheadedness 05/10/2019   Vitamin D deficiency 05/10/2019   Class 2 obesity due to excess calories with body mass index (BMI) of 37.0 to 37.9 in adult 04/16/2019   Sarcoidosis 05/04/2015  Bronchiectasis (HCC) 05/04/2015    Past Medical History:  Diagnosis Date   Acute cholecystitis 08/24/2016   Acute kidney injury superimposed on chronic kidney disease (HCC) 06/20/2019   Acute respiratory failure with hypoxia (HCC) 06/19/2019   Annual visit for general adult medical examination with abnormal findings 04/16/2019   Arthritis    right knee   Bronchiectasis (HCC)    CKD (chronic  kidney disease)    Iron deficiency    Lightheadedness 05/10/2019   PE (pulmonary thromboembolism) (HCC) 06/2019   Pre-syncope 04/16/2019   Sarcoidosis    Sarcoidosis    Sleep apnea    c pap   Smoker 02/12/2014    Family History  Problem Relation Age of Onset   Hypertension Mother    Cancer Mother    Colon polyps Mother    Healthy Sister    Healthy Sister    Healthy Sister    Healthy Sister    Healthy Son    Healthy Son    Healthy Daughter    Colon cancer Neg Hx    Esophageal cancer Neg Hx    Rectal cancer Neg Hx    Stomach cancer Neg Hx    Past Surgical History:  Procedure Laterality Date   CHOLECYSTECTOMY N/A 08/25/2016   Procedure: LAPAROSCOPIC CHOLECYSTECTOMY;  Surgeon: Abigail MiyamotoBlackman, Douglas, MD;  Location: [facility] OR;  Service: General;  Laterality: N/A;   TUBAL LIGATION  02/1993   Social History   Social History Narrative   Live with partners [name]-19 years   3 children.    1-[name] (girl) 27 at home with her: 2 grandchildren in her home as well    [name]- 26 expecting   [name]-25      Enjoy: read, play games on tablet, grandkids      Diet: Veggies, does not eat a lot of fried foods, enjoys chicken and fruit   Caffeine: sodas and coffee (with sugar)   Water: Does not drink a lot at all      Wears seat beat   Does not wear sunscreen   Smoke and carbon monoxide detectors   Does not use phone while driving    Immunization History  Administered Date(s) Administered   Influenza Inj Mdck Quad Pf 12/25/2019   Influenza,inj,Quad PF,6+ Mos 12/21/2018   Influenza,inj,Quad PF,6-35 Mos 01/10/2019   PFIZER(Purple Top)SARS-COV-2 Vaccination 08/14/2019, 09/10/2019, 02/07/2020   Pneumococcal Polysaccharide-23 02/12/2014   Tdap 02/12/2014     Objective: Vital Signs: BP 126/84 (BP Location: Left Arm, Patient Position: Sitting, Cuff Size: Normal)   Pulse 66   Ht 5\' 5"  (1.651 m)   Wt 214 lb 12.8 oz (97.4 kg)   BMI 35.74 kg/m    Physical Exam Vitals and nursing note  reviewed.  Constitutional:      Appearance: She is well-developed.  HENT:     Head: Normocephalic and atraumatic.  Eyes:     Conjunctiva/sclera: Conjunctivae normal.  Cardiovascular:     Rate and Rhythm: Normal rate and regular rhythm.     Heart sounds: Normal heart sounds.  Pulmonary:     Effort: Pulmonary effort is normal.     Breath sounds: Normal breath sounds.  Abdominal:     General: Bowel sounds are normal.     Palpations: Abdomen is soft.  Musculoskeletal:     Cervical back: Normal range of motion.  Lymphadenopathy:     Cervical: No cervical adenopathy.  Skin:    General: Skin is warm and dry.     Capillary Refill: Capillary  refill takes less than 2 seconds.  Neurological:     Mental Status: She is alert and oriented to person, place, and time.  Psychiatric:        Behavior: Behavior normal.     Musculoskeletal Exam: C-spine thoracic and lumbar spine with good range of motion.  Shoulder joints, elbow joints, wrist joints, MCPs, PIPs and DIPs with good range of motion with no synovitis.  Hip joints, knee joints, ankles, MTPs and PIPs with good range of motion with no synovitis.  CDAI Exam: CDAI Score: -- Patient Global: --; Provider Global: -- Swollen: 0 ; Tender: 0  Joint Exam 09/29/2020   No joint exam has been documented for this visit   There is currently no information documented on the homunculus. Go to the Rheumatology activity and complete the homunculus joint exam.  Investigation: No additional findings.  Imaging: No results found.  Recent Labs: Lab Results  Component Value Date   WBC 10.2 09/23/2020   HGB 12.7 09/23/2020   PLT 313 09/23/2020   NA 138 09/23/2020   K 4.3 09/23/2020   CL 105 09/23/2020   CO2 26 09/23/2020   GLUCOSE 93 09/23/2020   BUN 17 09/23/2020   CREATININE 1.19 (H) 09/23/2020   BILITOT 0.4 09/23/2020   ALKPHOS 80 08/21/2019   AST 22 09/23/2020   ALT 15 09/23/2020   PROT 7.0 09/23/2020   ALBUMIN 3.9 08/21/2019    CALCIUM 9.1 09/23/2020   GFRAA 62 09/23/2020   QFTBGOLD Negative 05/01/2015   QFTBGOLDPLUS NEGATIVE 11/08/2019    Speciality Comments: No specialty comments available.  Procedures:  No procedures performed Allergies: Patient has no known allergies.   Assessment / Plan:     Visit Diagnoses: Sarcoidosis - History of pulmonary sarcoidosis.  Treated with prednisone for 1-1/2-year by Dr. Craige Cotta. No recurrence of pulmonary sarcoidosis.  She has been followed by Dr. Craige Cotta closely.  High risk medication use -patient took Imuran in February for about a month and did not notice any improvement in her symptoms.  Imuran was mainly given for inflammatory arthritis.  Positive ANA (antinuclear antibody) - ANA is low titer positive and persistent.  ENA negative, lupus anticoagulant negative now, beta-2 and anticardiolipin's were negative.  Complements are normal.  -Patient had positive ANA and positive lupus anticoagulant in the past.  I will check labs again today.  Plan: Sedimentation rate, C3 and C4, ANA, Lupus Anticoagulant Eval w/Reflex, Cardiolipin antibodies, IgG, IgM, IgA, Beta-2 glycoprotein antibodies, Anti-DNA antibody, double-stranded.  I will contact her once the lab results available.  Effusion, right knee - Treated by Dr. Roda Shutters.  Synovial WBC count was elevated.  MRI showed tiny radial tear of the medial meniscus.  All autoimmune work-up negative.  She had no recurrence of effusion.  Primary osteoarthritis of both hands-no synovitis was noted today.  Trochanteric bursitis of both hips-she had discomfort on palpation of bilateral trochanteric bursa.  I gave her a handout on IT band stretches.  I also discussed the option of physical therapy over trochanteric bursa injection in the future if the symptoms persist.  Other medical problems are listed as follows:  Bilateral pulmonary embolism (HCC)  Pulmonary hypertension (HCC)  Bronchiectasis without complication (HCC)  Stage 2 chronic kidney  disease  Essential hypertension  Mixed hyperlipidemia  Secondary hyperparathyroidism (HCC)  Vitamin D deficiency  OSA (obstructive sleep apnea)  Daytime hypersomnolence  Orders: Orders Placed This Encounter  Procedures   Sedimentation rate   C3 and C4   ANA  Lupus Anticoagulant Eval w/Reflex   Cardiolipin antibodies, IgG, IgM, IgA   Beta-2 glycoprotein antibodies   Anti-DNA antibody, double-stranded    No orders of the defined types were placed in this encounter.   Follow-Up Instructions: Return for Sarcoidosis, positive ANA.   Pollyann Savoy, MD  Note - This record has been created using Animal nutritionist.  Chart creation errors have been sought, but may not always  have been located. Such creation errors do not reflect on  the standard of medical care.

## 2020-09-21 NOTE — Progress Notes (Signed)
Cardiology Office Note  Date: 09/22/2020   ID: Hailey Ross, DOB 1970/07/12, MRN [id number]  PCP:  Freddy Finner, NP  Cardiologist:  Dina Rich, MD Electrophysiologist:  None   Chief Complaint: Dizziness   History of Present Illness: Hailey Ross is a 50 y.o. female with a history of lightheadedness, IDA, sarcoidosis, sleep apnea, smoker, arthritis, CKD, respiratory failure with hypoxia, HTN, pulmonary hypertension, bilateral pulmonary embolism, sarcoidosis, palpitations, CKD stage II, pulmonary hypertension, history of PE, OSA on CPAP, HLD.  Last seen by Dr. Wyline Mood on 06/02/2020.  She did had some previous issues with palpitations with ER visit on 08/27/2019 with tachycardia and dizziness.  Episode of heart racing to 140s while driving lasting approximately 5 minutes.  Had a subsequent 30-day monitor in May 2021, no significant arrhythmias.  She was started on diltiazem 30 mg p.o. twice daily for symptoms.  Since starting diltiazem she had been doing well with no recent symptoms.  She had been followed by pulmonary for sarcoid and bronchiectasis with no significant shortness of breath.  Pulmonary hypertension had been noted on echo during PE.  Subsequent echo showed PASP had decreased.  She had mixed compliance with her CPAP.  She was compliant with her statin medications for hyperlipidemia.  Last LDL was 152.  She is here for continued complaints of dizziness.  She states this has been ongoing for over a year.  She recently had an MRI of the brain by neurology which was unremarkable. Previous event monitor in May 2021 showed no significant arrhythmias.  Echocardiogram June 2021 showed EF of 55 to 60%.  No WMA's.  Mildly elevated PASP of 35.3 mmHg.  LA and RA mildly dilated.,  Tricuspid regurgitation mild to moderate.  Previous carotid Doppler studies showed bilateral ICA stenosis 1 to 39% on 03/02/2020.  She states the dizziness can occur with or without activity.  She states  she had 1 episode where she had significant coughing episode where she nearly passed out.  She has a long history of smoking. She had pulmonary function test on 09/26/2019 with pulmonary clinic.  Spirometry is suggestive of restrictive defect.  No significant bronchodilator response.  Mild restriction with lung volume testing.  Air trapping as demonstrated by RV: TLC ratio, moderate to severe diffusion defect.  She has chronic shortness of breath.  States she has occasional palpitations.  Denies any anginal symptoms, CVA or TIA-like symptoms, PND, orthopnea, bleeding.  No claudication-like symptoms, DVT or PE-like symptoms.  No lower extremity edema.   Past Medical History:  Diagnosis Date   Acute cholecystitis 08/24/2016   Acute kidney injury superimposed on chronic kidney disease (HCC) 06/20/2019   Acute respiratory failure with hypoxia (HCC) 06/19/2019   Annual visit for general adult medical examination with abnormal findings 04/16/2019   Arthritis    right knee   Bronchiectasis (HCC)    CKD (chronic kidney disease)    Iron deficiency    Lightheadedness 05/10/2019   PE (pulmonary thromboembolism) (HCC) 06/2019   Pre-syncope 04/16/2019   Sarcoidosis    Sarcoidosis    Sleep apnea    c pap   Smoker 02/12/2014    Past Surgical History:  Procedure Laterality Date   CHOLECYSTECTOMY N/A 08/25/2016   Procedure: LAPAROSCOPIC CHOLECYSTECTOMY;  Surgeon: Abigail Miyamoto, MD;  Location: [facility] OR;  Service: General;  Laterality: N/A;   TUBAL LIGATION  02/1993    Current Outpatient Medications  Medication Sig Dispense Refill   acetaminophen (TYLENOL) 500 MG tablet Take  500 mg by mouth every 4 (four) hours as needed for mild pain, moderate pain or headache.      albuterol (PROVENTIL) (2.5 MG/3ML) 0.083% nebulizer solution Take 3 mLs (2.5 mg total) by nebulization every 6 (six) hours as needed for wheezing or shortness of breath. 75 mL 1   albuterol (VENTOLIN HFA) 108 (90 Base) MCG/ACT inhaler INHALE 2  PUFFS INTO THE LUNGS EVERY 6 HOURS AS NEEDED FOR WHEEZING OR SHORTNESS OF BREATH 6.7 g 5   azaTHIOprine (IMURAN) 50 MG tablet Take 1.5 tablets (75 mg total) by mouth daily. 45 tablet 2   diltiazem (CARDIZEM) 30 MG tablet TAKE 1 TABLET(30 MG) BY MOUTH TWICE DAILY 180 tablet 3   pantoprazole (PROTONIX) 40 MG tablet Take 1 tablet (40 mg total) by mouth daily. 90 tablet 3   prazosin (MINIPRESS) 1 MG capsule TAKE 1 CAPSULE(1 MG) BY MOUTH AT BEDTIME 90 capsule 1   QUEtiapine (SEROQUEL) 50 MG tablet Take 1 tablet (50 mg total) by mouth at bedtime. 30 tablet 1   rosuvastatin (CRESTOR) 5 MG tablet Take 1 tablet (5 mg total) by mouth daily. 90 tablet 1   Vitamin D, Ergocalciferol, (DRISDOL) 1.25 MG (50000 UNIT) CAPS capsule TAKE 1 CAPSULE BY MOUTH EVERY 7 DAYS 12 capsule 1   XARELTO 20 MG TABS tablet TAKE 1 TABLET(20 MG) BY MOUTH DAILY WITH SUPPER 90 tablet 2   No current facility-administered medications for this visit.   Allergies:  Patient has no known allergies.   Social History: The patient  reports that she quit smoking about 5 years ago. Her smoking use included cigarettes. She has a 20.00 pack-year smoking history. She has never used smokeless tobacco. She reports current alcohol use. She reports that she does not use drugs.   Family History: The patient's family history includes Cancer in her mother; Colon polyps in her mother; Healthy in her daughter, sister, sister, sister, sister, son, and son; Hypertension in her mother.   ROS:  Please see the history of present illness. Otherwise, complete review of systems is positive for none.  All other systems are reviewed and negative.   Physical Exam: VS:  BP 100/78   Pulse 68   Ht 5\' 5"  (1.651 m)   Wt 213 lb 3.2 oz (96.7 kg)   SpO2 92%   BMI 35.48 kg/m , BMI Body mass index is 35.48 kg/m.  Wt Readings from Last 3 Encounters:  09/22/20 213 lb 3.2 oz (96.7 kg)  09/03/20 213 lb (96.6 kg)  07/27/20 217 lb (98.4 kg)    General: Patient  appears comfortable at rest. Neck: Supple, no elevated JVP or carotid bruits, no thyromegaly. Lungs: Clear to auscultation, nonlabored breathing at rest. Cardiac: Regular rate and rhythm, no S3 or significant systolic murmur, no pericardial rub. Extremities: No pitting edema, distal pulses 2+. Skin: Warm and dry. Musculoskeletal: No kyphosis. Neuropsychiatric: Alert and oriented x3, affect grossly appropriate.  ECG:  EKG 01/22/2020 normal sinus rhythm rate of 70  Recent Labwork: 06/25/2020: ALT 11; AST 14; BUN 14; Creat 1.12; Hemoglobin 12.1; Platelets 331; Potassium 4.3; Sodium 140     Component Value Date/Time   CHOL 230 (H) 04/22/2019 0839   TRIG 93 04/22/2019 0839   HDL 58 04/22/2019 0839   CHOLHDL 4.0 04/22/2019 0839   VLDL 22 02/17/2014 0831   LDLCALC 152 (H) 04/22/2019 0839    Other Studies Reviewed Today:   08/2019 event monitor 30 day event monitor Min HR 47, Max HR 135, Avg HR  70. Min HR occurred in early AM hours presumably while sleeping Symptoms correlate with sinus rhythm No significant arrhythmias   09/2019 echo IMPRESSIONS     1. Left ventricular ejection fraction, by estimation, is 55 to 60%. The  left ventricle has normal function. The left ventricle has no regional  wall motion abnormalities. Left ventricular diastolic parameters were  normal.   2. Right ventricular systolic function is mildly reduced. The right  ventricular size is moderately enlarged. There is mildly elevated  pulmonary artery systolic pressure. The estimated right ventricular  systolic pressure is 35.3 mmHg.   3. Left atrial size was mildly dilated.   4. Right atrial size was mildly dilated.   5. The mitral valve is normal in structure. No evidence of mitral valve  regurgitation. No evidence of mitral stenosis.   6. Tricuspid valve regurgitation is mild to moderate.   7. The aortic valve is normal in structure. Aortic valve regurgitation is  not visualized. No aortic stenosis is  present.   8. The inferior vena cava is normal in size with greater than 50%  respiratory variability, suggesting right atrial pressure of 3 mmHg.        Assessment and Plan:  1. Dizziness   2. Palpitations   3. Mixed hyperlipidemia   4. SOB (shortness of breath)   5. Essential hypertension    1. Dizziness Complaining of dizziness which is random in nature and not associated with activity.  She states it can occur at rest or with activity.  She denies any near syncope or syncopal episodes.  However a while back she had an episode where she was coughing heavily and nearly passed out.  She has had no subsequent episodes.  Her orthostatic blood pressures today were within normal limits.  Her blood pressure on arrival on our machine was 100/78.  She is currently taking prazosin 1 mg daily.  Lisinopril 2.5 mg daily.  Please get a 14-day ZIO monitor to check for arrhythmias.  2. Palpitations History of palpitations on Cardizem 30 mg p.o. twice daily.  States she notices occasional palpitations which are not bothersome.  Continue Cardizem 30 mg p.o. twice daily.   3. Mixed hyperlipidemia Continue Crestor 5 mg p.o. daily.  4.  Shortness of breath Complaining of increasing shortness of breath.  She does have a history of sarcoidosis.  Significant history of smoking.  Recent pulmonary function test Spirometry is suggestive of restrictive defect.  No significant bronchodilator response.  Mild restriction with lung volume testing.  Air trapping as demonstrated by RV: TLC ratio, moderate to severe diffusion defect.  Previous echo showed EF of 55 to 60%.  No WMA's.  RV moderately enlarged.  Mildly elevated PASP at 35.3 mmHg.  LA and RA mildly dilated.  Mild to moderate tricuspid regurgitation.  Please get repeat echocardiogram  5.  Hypertension Currently on lisinopril 2.5 mg daily, prazosin 1 mg daily.  Blood pressure 100/78.  We checked her orthostatic vital signs today.  Blood pressure lying: 122/80  with a heart rate of 61: Blood pressure sitting 116/82 with a rate of 65: Blood pressure standing 120/84 with a rate of 71, blood pressure standing 3 minutes 116/82.  Please stop lisinopril and continue prazosin due to dizziness episodes.   Medication Adjustments/Labs and Tests Ordered: Current medicines are reviewed at length with the patient today.  Concerns regarding medicines are outlined above.   Disposition: Follow-up with Dr. Wyline Mood or APP 6 to 8 weeks  Signed, Rennis Harding, NP  09/22/2020 11:32 AM    [facility] at [facility] [address] [place], [place], [place] [address] Phone: [phone]; Fax: [phone]

## 2020-09-22 ENCOUNTER — Other Ambulatory Visit: Payer: Self-pay | Admitting: Family Medicine

## 2020-09-22 ENCOUNTER — Ambulatory Visit (INDEPENDENT_AMBULATORY_CARE_PROVIDER_SITE_OTHER): Payer: 59 | Admitting: Family Medicine

## 2020-09-22 ENCOUNTER — Encounter: Payer: Self-pay | Admitting: Family Medicine

## 2020-09-22 ENCOUNTER — Ambulatory Visit (INDEPENDENT_AMBULATORY_CARE_PROVIDER_SITE_OTHER): Payer: 59

## 2020-09-22 VITALS — BP 100/78 | HR 68 | Ht 65.0 in | Wt 213.2 lb

## 2020-09-22 DIAGNOSIS — R42 Dizziness and giddiness: Secondary | ICD-10-CM | POA: Diagnosis not present

## 2020-09-22 DIAGNOSIS — E782 Mixed hyperlipidemia: Secondary | ICD-10-CM

## 2020-09-22 DIAGNOSIS — R0602 Shortness of breath: Secondary | ICD-10-CM | POA: Diagnosis not present

## 2020-09-22 DIAGNOSIS — R002 Palpitations: Secondary | ICD-10-CM

## 2020-09-22 DIAGNOSIS — I10 Essential (primary) hypertension: Secondary | ICD-10-CM

## 2020-09-22 NOTE — Patient Instructions (Signed)
Medication Instructions:  Your physician has recommended you make the following change in your medication:  Stop lisinopril Continue other medications the same  Labwork: none  Testing/Procedures: Your physician has requested that you have an echocardiogram. Echocardiography is a painless test that uses sound waves to create images of your heart. It provides your doctor with information about the size and shape of your heart and how well your heart's chambers and valves are working. This procedure takes approximately one hour. There are no restrictions for this procedure. ZIO- Long Term Monitor Instructions   Your physician has requested you wear your ZIO patch monitor 14 days.   This is a single patch monitor.  [employer] supplies one patch monitor per enrollment.  Additional stickers are not available.   Please do not apply patch if you will be having a Nuclear Stress Test, Echocardiogram, Cardiac CT, MRI, or Chest Xray during the time frame you would be wearing the monitor. The patch cannot be worn during these tests.  You cannot remove and re-apply the ZIO XT patch monitor.     Once you have received you monitor, please review enclosed instructions.  Your monitor has already been registered assigning a specific monitor serial # to you.   Applying the monitor   Shave hair from upper left chest.   Hold abrader disc by orange tab.  Rub abrader in 40 strokes over left upper chest as indicated in your monitor instructions.   Clean area with 4 enclosed alcohol pads .  Use all pads to assure are is cleaned thoroughly.  Let dry.   Apply patch as indicated in monitor instructions.  Patch will be place under collarbone on left side of chest with arrow pointing upward.   Rub patch adhesive wings for 2 minutes.Remove white label marked "1".  Remove white label marked "2".  Rub patch adhesive wings for 2 additional minutes.   While looking in a mirror, press and release button in center of  patch.  A small green light will flash 3-4 times .  This will be your only indicator the monitor has been turned on.     Do not shower for the first 24 hours.  You may shower after the first 24 hours.   Press button if you feel a symptom. You will hear a small click.  Record Date, Time and Symptom in the Patient Log Book.   When you are ready to remove patch, follow instructions on last 2 pages of Patient Log Book.  Stick patch monitor onto last page of Patient Log Book.   Place Patient Log Book in [place] box.  Use locking tab on box and tape box closed securely.  The [employer] and [employer] has [employer] on it.  Please place in mailbox as soon as possible.  Your physician should have your test results approximately 7 days after the monitor has been mailed back to [facility].   Call [facility] Customer Care at [phone] if you have questions regarding your ZIO XT patch monitor.  Call them immediately if you see an orange light blinking on your monitor.   If your monitor falls off in less than 4 days contact our Monitor department at [phone].  If your monitor becomes loose or falls off after 4 days call [employer] at [phone] for suggestions on securing your monitor.  Follow-Up: Your physician recommends that you schedule a follow-up appointment in: 6-8 weeks  Any Other Special Instructions Will Be Listed Below (If Applicable).  If  you need a refill on your cardiac medications before your next appointment, please call your pharmacy.

## 2020-09-23 ENCOUNTER — Other Ambulatory Visit: Payer: Self-pay

## 2020-09-23 DIAGNOSIS — Z79899 Other long term (current) drug therapy: Secondary | ICD-10-CM

## 2020-09-24 LAB — COMPLETE METABOLIC PANEL WITH GFR
AG Ratio: 1.3 (calc) (ref 1.0–2.5)
ALT: 15 U/L (ref 6–29)
AST: 22 U/L (ref 10–35)
Albumin: 4 g/dL (ref 3.6–5.1)
Alkaline phosphatase (APISO): 85 U/L (ref 37–153)
BUN/Creatinine Ratio: 14 (calc) (ref 6–22)
BUN: 17 mg/dL (ref 7–25)
CO2: 26 mmol/L (ref 20–32)
Calcium: 9.1 mg/dL (ref 8.6–10.4)
Chloride: 105 mmol/L (ref 98–110)
Creat: 1.19 mg/dL — ABNORMAL HIGH (ref 0.50–1.05)
GFR, Est African American: 62 mL/min/{1.73_m2} (ref >=60)
GFR, Est Non African American: 53 mL/min/{1.73_m2} — ABNORMAL LOW (ref >=60)
Globulin: 3 g/dL (calc) (ref 1.9–3.7)
Glucose, Bld: 93 mg/dL (ref 65–99)
Potassium: 4.3 mmol/L (ref 3.5–5.3)
Sodium: 138 mmol/L (ref 135–146)
Total Bilirubin: 0.4 mg/dL (ref 0.2–1.2)
Total Protein: 7 g/dL (ref 6.1–8.1)

## 2020-09-24 LAB — CBC WITH DIFFERENTIAL/PLATELET
Absolute Monocytes: 622 cells/uL (ref 200–950)
Basophils Absolute: 51 cells/uL (ref 0–200)
Basophils Relative: 0.5 %
Eosinophils Absolute: 173 cells/uL (ref 15–500)
Eosinophils Relative: 1.7 %
HCT: 39.7 % (ref 35.0–45.0)
Hemoglobin: 12.7 g/dL (ref 11.7–15.5)
Lymphs Abs: 3295 cells/uL (ref 850–3900)
MCH: 27.1 pg (ref 27.0–33.0)
MCHC: 32 g/dL (ref 32.0–36.0)
MCV: 84.8 fL (ref 80.0–100.0)
MPV: 10 fL (ref 7.5–12.5)
Monocytes Relative: 6.1 %
Neutro Abs: 6059 cells/uL (ref 1500–7800)
Neutrophils Relative %: 59.4 %
Platelets: 313 10*3/uL (ref 140–400)
RBC: 4.68 10*6/uL (ref 3.80–5.10)
RDW: 12.5 % (ref 11.0–15.0)
Total Lymphocyte: 32.3 %
WBC: 10.2 10*3/uL (ref 3.8–10.8)

## 2020-09-24 NOTE — Progress Notes (Signed)
Creatinine is elevated but GFR remains WNL.  Renal function is stable. Rest of CMP WNL.  CBC WNL.  No change in therapy at this time.

## 2020-09-29 ENCOUNTER — Other Ambulatory Visit: Payer: Self-pay | Admitting: *Deleted

## 2020-09-29 ENCOUNTER — Other Ambulatory Visit: Payer: Self-pay

## 2020-09-29 ENCOUNTER — Encounter: Payer: Self-pay | Admitting: Rheumatology

## 2020-09-29 ENCOUNTER — Ambulatory Visit (INDEPENDENT_AMBULATORY_CARE_PROVIDER_SITE_OTHER): Payer: 59 | Admitting: Rheumatology

## 2020-09-29 VITALS — BP 126/84 | HR 66 | Ht 65.0 in | Wt 214.8 lb

## 2020-09-29 DIAGNOSIS — G4719 Other hypersomnia: Secondary | ICD-10-CM

## 2020-09-29 DIAGNOSIS — M25461 Effusion, right knee: Secondary | ICD-10-CM

## 2020-09-29 DIAGNOSIS — R768 Other specified abnormal immunological findings in serum: Secondary | ICD-10-CM | POA: Diagnosis not present

## 2020-09-29 DIAGNOSIS — I10 Essential (primary) hypertension: Secondary | ICD-10-CM

## 2020-09-29 DIAGNOSIS — I2699 Other pulmonary embolism without acute cor pulmonale: Secondary | ICD-10-CM

## 2020-09-29 DIAGNOSIS — N2581 Secondary hyperparathyroidism of renal origin: Secondary | ICD-10-CM

## 2020-09-29 DIAGNOSIS — M19042 Primary osteoarthritis, left hand: Secondary | ICD-10-CM

## 2020-09-29 DIAGNOSIS — D869 Sarcoidosis, unspecified: Secondary | ICD-10-CM | POA: Diagnosis not present

## 2020-09-29 DIAGNOSIS — E559 Vitamin D deficiency, unspecified: Secondary | ICD-10-CM

## 2020-09-29 DIAGNOSIS — Z79899 Other long term (current) drug therapy: Secondary | ICD-10-CM

## 2020-09-29 DIAGNOSIS — M7061 Trochanteric bursitis, right hip: Secondary | ICD-10-CM

## 2020-09-29 DIAGNOSIS — G4733 Obstructive sleep apnea (adult) (pediatric): Secondary | ICD-10-CM

## 2020-09-29 DIAGNOSIS — I272 Pulmonary hypertension, unspecified: Secondary | ICD-10-CM

## 2020-09-29 DIAGNOSIS — E782 Mixed hyperlipidemia: Secondary | ICD-10-CM

## 2020-09-29 DIAGNOSIS — M7062 Trochanteric bursitis, left hip: Secondary | ICD-10-CM

## 2020-09-29 DIAGNOSIS — N182 Chronic kidney disease, stage 2 (mild): Secondary | ICD-10-CM

## 2020-09-29 DIAGNOSIS — J479 Bronchiectasis, uncomplicated: Secondary | ICD-10-CM

## 2020-09-29 DIAGNOSIS — M19041 Primary osteoarthritis, right hand: Secondary | ICD-10-CM

## 2020-09-29 MED ORDER — AZATHIOPRINE 50 MG PO TABS: 75.0000 mg | ORAL_TABLET | Freq: Every day | ORAL | 2 refills | Status: DC

## 2020-09-29 NOTE — Telephone Encounter (Signed)
Next Visit: 09/29/2020  Last Visit: 06/25/2020  Last Fill: 06/26/2020  DX: Sarcoidosis  Current Dose per lab note, Ok to increase imuran 50 mg 1.5 tablets daily (75 mg total).   Labs: 09/23/2020, Creatinine is elevated but GFR remains WNL.  Renal function is stable. Rest ofCMP WNL.  CBC WNL.  No change in therapy at this time.   Okay to refill Imuran?

## 2020-09-29 NOTE — Patient Instructions (Signed)
Iliotibial Band Syndrome Rehab Ask your health care provider which exercises are safe for you. Do exercises exactly as told by your health care provider and adjust them as directed. It is normal to feel mild stretching, pulling, tightness, or discomfort as you do these exercises. Stop right away if you feel sudden pain or your pain gets significantly worse. Do not begin these exercises until told by your health care provider. Stretching and range-of-motion exercises These exercises warm up your muscles and joints and improve the movement andflexibility of your hip and pelvis. Quadriceps stretch, prone  Lie on your abdomen (prone position) on a firm surface, such as a bed or padded floor. Bend your left / right knee and reach back to hold your ankle or pant leg. If you cannot reach your ankle or pant leg, loop a belt around your foot and grab the belt instead. Gently pull your heel toward your buttocks. Your knee should not slide out to the side. You should feel a stretch in the front of your thigh and knee (quadriceps). Hold this position for __________ seconds. Repeat __________ times. Complete this exercise __________ times a day. Iliotibial band stretch An iliotibial band is a strong band of muscle tissue that runs from the outer side of your hip to the outer side of your thigh and knee. Lie on your side with your left / right leg in the top position. Bend both of your knees and grab your left / right ankle. Stretch out your bottom arm to help you balance. Slowly bring your top knee back so your thigh goes behind your trunk. Slowly lower your top leg toward the floor until you feel a gentle stretch on the outside of your left / right hip and thigh. If you do not feel a stretch and your knee will not fall farther, place the heel of your other foot on top of your knee and pull your knee down toward the floor with your foot. Hold this position for __________ seconds. Repeat __________ times.  Complete this exercise __________ times a day. Strengthening exercises These exercises build strength and endurance in your hip and pelvis. Enduranceis the ability to use your muscles for a long time, even after they get tired. Straight leg raises, side-lying This exercise strengthens the muscles that rotate the leg at the hip and move it away from your body (hip abductors). Lie on your side with your left / right leg in the top position. Lie so your head, shoulder, hip, and knee line up. You may bend your bottom knee to help you balance. Roll your hips slightly forward so your hips are stacked directly over each other and your left / right knee is facing forward. Tense the muscles in your outer thigh and lift your top leg 4-6 inches (10-15 cm). Hold this position for __________ seconds. Slowly lower your leg to return to the starting position. Let your muscles relax completely before doing another repetition. Repeat __________ times. Complete this exercise __________ times a day. Leg raises, prone This exercise strengthens the muscles that move the hips backward (hip extensors). Lie on your abdomen (prone position) on your bed or a firm surface. You can put a pillow under your hips if that is more comfortable for your lower back. Bend your left / right knee so your foot is straight up in the air. Squeeze your buttocks muscles and lift your left / right thigh off the bed. Do not let your back arch. Tense your thigh   muscle as hard as you can without increasing any knee pain. Hold this position for __________ seconds. Slowly lower your leg to return to the starting position and allow it to relax completely. Repeat __________ times. Complete this exercise __________ times a day. Hip hike Stand sideways on a bottom step. Stand on your left / right leg with your other foot unsupported next to the step. You can hold on to a railing or wall for balance if needed. Keep your knees straight and your  torso square. Then lift your left / right hip up toward the ceiling. Slowly let your left / right hip lower toward the floor, past the starting position. Your foot should get closer to the floor. Do not lean or bend your knees. Repeat __________ times. Complete this exercise __________ times a day. This information is not intended to replace advice given to you by your health care provider. Make sure you discuss any questions you have with your healthcare provider. Document Revised: 06/05/2019 Document Reviewed: 06/05/2019 Elsevier Patient Education  2022 Elsevier Inc.  

## 2020-09-30 ENCOUNTER — Telehealth: Payer: Self-pay | Admitting: Pulmonary Disease

## 2020-09-30 MED ORDER — DOXYCYCLINE HYCLATE 100 MG PO TABS: ORAL_TABLET | ORAL | 0 refills | Status: DC

## 2020-09-30 NOTE — Telephone Encounter (Signed)
Please send in RX doxycyline 100mg  BID x 10 days for bronchiectasis exacerbation. She can take mucinex 600-1200mg  twice a day. If she coughing up bright blood> 120cc needs ED evaluation

## 2020-09-30 NOTE — Telephone Encounter (Signed)
Doxycycline sent into pharmacy. Called and spoke with patient, advised of Beth's recs. Patient verbalized understanding.  Nothing further needed at this time.

## 2020-09-30 NOTE — Telephone Encounter (Signed)
Spoke with pt who states increased productive cough with green phlegm and small amount of bright red blood over last 3 days. Pt states that she has not tried any OTC medications to help with cough. Pt wants to know what can be recommended for cough. Lanora Manis can you please advise since Dr. Craige Cotta is currently unavailable.

## 2020-10-03 LAB — C3 AND C4
C3 Complement: 176 mg/dL (ref 83–193)
C4 Complement: 51 mg/dL (ref 15–57)

## 2020-10-03 LAB — ANTI-DNA ANTIBODY, DOUBLE-STRANDED: ds DNA Ab: 1 IU/mL

## 2020-10-03 LAB — LUPUS ANTICOAGULANT EVAL W/ REFLEX
PTT-LA Screen: 43 s — ABNORMAL HIGH (ref <=40)
dRVVT: 55 s — ABNORMAL HIGH (ref <=45)

## 2020-10-03 LAB — ANTI-NUCLEAR AB-TITER (ANA TITER): ANA Titer 1: 1:320 {titer} — ABNORMAL HIGH

## 2020-10-03 LAB — CARDIOLIPIN ANTIBODIES, IGG, IGM, IGA
Anticardiolipin IgA: <2 APL-U/mL
Anticardiolipin IgG: <2 GPL-U/mL
Anticardiolipin IgM: <2 MPL-U/mL

## 2020-10-03 LAB — SEDIMENTATION RATE: Sed Rate: 39 mm/h — ABNORMAL HIGH (ref 0–20)

## 2020-10-03 LAB — BETA-2 GLYCOPROTEIN ANTIBODIES
Beta-2 Glyco 1 IgA: <2 U/mL
Beta-2 Glyco 1 IgM: <2 U/mL
Beta-2 Glyco I IgG: <2 U/mL

## 2020-10-03 LAB — ANA: Anti Nuclear Antibody (ANA): POSITIVE — AB

## 2020-10-03 LAB — RFLX DRVVT CONFRIM: DRVVT CONFIRM: NEGATIVE

## 2020-10-03 LAB — RFLX HEXAGONAL PHASE CONFIRM: Hexagonal Phase Conf: NEGATIVE

## 2020-10-04 NOTE — Progress Notes (Signed)
ESR is elevated. ANA is positive. All other labs are within normal limits. Patient was asymptomatic at the last visit. No additional treatment advised. Patient should contact us if she develops any new symptoms.

## 2020-10-15 ENCOUNTER — Ambulatory Visit: Payer: 59 | Admitting: Nurse Practitioner

## 2020-10-15 ENCOUNTER — Other Ambulatory Visit: Payer: Self-pay

## 2020-10-19 ENCOUNTER — Telehealth (INDEPENDENT_AMBULATORY_CARE_PROVIDER_SITE_OTHER): Payer: Self-pay

## 2020-10-19 ENCOUNTER — Telehealth: Payer: Self-pay | Admitting: Cardiology

## 2020-10-19 ENCOUNTER — Ambulatory Visit (INDEPENDENT_AMBULATORY_CARE_PROVIDER_SITE_OTHER): Payer: 59

## 2020-10-19 ENCOUNTER — Other Ambulatory Visit: Payer: Self-pay

## 2020-10-19 DIAGNOSIS — R0602 Shortness of breath: Secondary | ICD-10-CM | POA: Diagnosis not present

## 2020-10-19 LAB — ECHOCARDIOGRAM COMPLETE
Area-P 1/2: 4.63 cm2
Calc EF: 58.9 %
MV M vel: 3.83 m/s
MV Peak grad: 58.7 mmHg
S' Lateral: 3.5 cm
Single Plane A2C EF: 56.1 %
Single Plane A4C EF: 63.1 %

## 2020-10-19 NOTE — Telephone Encounter (Signed)
Patient called and stated that her previous doctor retired and she would like to see an MD and has heard good things about Dr. Karilyn Cota and is requesting to be a new patient because she is not comfortable where she is currently and is concerned about her health. Patient stated that she is not taking any pain medication.  Please advise if OK to schedule for a new patient appointment.

## 2020-10-19 NOTE — Telephone Encounter (Signed)
Called patient and scheduled her for 11/19/2020 at 10:20am. Patient verbalized an understanding and thanked [place].

## 2020-10-19 NOTE — Telephone Encounter (Signed)
Will send message to provider for his review of ED [facility] notes.   She has follow up scheduled with Mardelle Matte on 11/02/2020.  Had Echo today (10/19/2020).

## 2020-10-19 NOTE — Telephone Encounter (Signed)
Patient was in the [place] office today having her echo.  States that she was seen at ER [facility] on 10-16-2020 Palpitations onset this am States a tight pain that wraps around from back to front. Was told to notify Dr. Wyline Mood as soon as possible.  Notes in Care Everywhere [phone]

## 2020-10-21 NOTE — Telephone Encounter (Signed)
Reviewed ED evaluation, testing was reassuring. Would keep her f/u with Mardelle Matte, f/u echo   Dominga Ferry MD

## 2020-10-22 ENCOUNTER — Telehealth: Payer: Self-pay | Admitting: *Deleted

## 2020-10-22 NOTE — Telephone Encounter (Signed)
Patient notified and verbalized understanding. 

## 2020-10-22 NOTE — Telephone Encounter (Signed)
-----   Message from Netta Neat., NP sent at 10/19/2020 10:57 PM EDT ----- Please call patient and let her know the echocardiogram shows she has good pumping function of her heart.  She has a very slight leak in a valve on the left side of her heart.  This is not clinically significant.  There is no evidence on the echocardiogram which would contribute to shortness of breath.  Netta Neat, NP  10/19/2020 10:57 PM

## 2020-10-22 NOTE — Telephone Encounter (Signed)
Lesle Chris, LPN  11/26/7114 57:90 PM EDT Back to Top     Notified, copy to pcp.

## 2020-10-27 ENCOUNTER — Ambulatory Visit (INDEPENDENT_AMBULATORY_CARE_PROVIDER_SITE_OTHER): Payer: 59 | Admitting: Orthopaedic Surgery

## 2020-10-27 ENCOUNTER — Ambulatory Visit: Payer: Self-pay

## 2020-10-27 ENCOUNTER — Encounter: Payer: Self-pay | Admitting: Orthopaedic Surgery

## 2020-10-27 ENCOUNTER — Other Ambulatory Visit: Payer: Self-pay

## 2020-10-27 DIAGNOSIS — M1712 Unilateral primary osteoarthritis, left knee: Secondary | ICD-10-CM

## 2020-10-27 MED ORDER — BUPIVACAINE HCL 0.5 % IJ SOLN
2.0000 mL | INTRAMUSCULAR | Status: AC | PRN
  Administered 2020-10-27: 2 mL via INTRA_ARTICULAR

## 2020-10-27 MED ORDER — LIDOCAINE HCL 1 % IJ SOLN
2.0000 mL | INTRAMUSCULAR | Status: AC | PRN
  Administered 2020-10-27: 2 mL

## 2020-10-27 MED ORDER — METHYLPREDNISOLONE ACETATE 40 MG/ML IJ SUSP
40.0000 mg | INTRAMUSCULAR | Status: AC | PRN
  Administered 2020-10-27: 40 mg via INTRA_ARTICULAR

## 2020-10-27 NOTE — Progress Notes (Signed)
Office Visit Note   Patient: Hailey Ross           Date of Birth: February 24, 1971           MRN: [id number] Visit Date: 10/27/2020              Requested by: Heather Roberts, NP [address]  [address] [place],  [place] [address] PCP: Heather Roberts, NP   Assessment & Plan: Visit Diagnoses:  1. Primary osteoarthritis of left knee     Plan: Based on findings impression is left knee OA exacerbation.  We performed aspiration injection today.  We obtained 15 cc of joint fluid.  She will rest for couple weeks and then increase activity as tolerated.  We will see her back as needed.  Follow-Up Instructions: Return if symptoms worsen or fail to improve.   Orders:  Orders Placed This Encounter  Procedures   XR KNEE 3 VIEW LEFT   No orders of the defined types were placed in this encounter.     Procedures: Large Joint Inj: L knee on 10/27/2020 1:56 PM Details: 22 G needle Medications: 2 mL bupivacaine 0.5 %; 2 mL lidocaine 1 %; 40 mg methylPREDNISolone acetate 40 MG/ML Outcome: tolerated well, no immediate complications Patient was prepped and draped in the usual sterile fashion.      Clinical Data: No additional findings.   Subjective: Chief Complaint  Patient presents with   Left Knee - Pain    Hailey Ross is a very pleasant 50 year old female that I saw couple years ago for right knee osteoarthritis flareup that responded really well of 2 right knee aspiration and injection.  Recently the left knee has started hurting over the last couple weeks.  Denies any injuries.  She does feel pain in the back of the knee and overall the symptoms are reminiscent of the right knee.  Denies any mechanical symptoms.  Denies any fevers or chills.   Review of Systems  Constitutional: Negative.   HENT: Negative.    Eyes: Negative.   Respiratory: Negative.    Cardiovascular: Negative.   Endocrine: Negative.   Musculoskeletal: Negative.   Neurological: Negative.    Hematological: Negative.   Psychiatric/Behavioral: Negative.    All other systems reviewed and are negative.   Objective: Vital Signs: There were no vitals taken for this visit.  Physical Exam Vitals and nursing note reviewed.  Constitutional:      Appearance: She is well-developed.  Pulmonary:     Effort: Pulmonary effort is normal.  Skin:    General: Skin is warm.     Capillary Refill: Capillary refill takes less than 2 seconds.  Neurological:     Mental Status: She is alert and oriented to person, place, and time.  Psychiatric:        Behavior: Behavior normal.        Thought Content: Thought content normal.        Judgment: Judgment normal.    Ortho Exam Left knee shows a small to medium joint effusion.  Mild crepitus with range of motion.  Collaterals and cruciates are stable.  No jointline tenderness. Specialty Comments:  No specialty comments available.  Imaging: XR KNEE 3 VIEW LEFT  Result Date: 10/27/2020 Mild osteoarthritis worse in the patellofemoral compartment.    PMFS History: Patient Active Problem List   Diagnosis Date Noted   Primary osteoarthritis of left knee 10/27/2020   Grief at loss of child 06/09/2020   Insomnia 06/09/2020  Right ankle swelling 02/05/2020   Encounter for examination following treatment at hospital 01/28/2020   Palpitations 01/28/2020   Menorrhagia with regular cycle 10/15/2019   OSA (obstructive sleep apnea) 10/08/2019   Right knee pain 08/29/2019   Positive ANA (antinuclear antibody) 08/29/2019   Essential hypertension 07/04/2019   Pain and swelling of right knee 07/04/2019   Hyperlipidemia 07/04/2019   Stage 2 chronic kidney disease 07/04/2019   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   Encounter for support and coordination of transition of care 07/04/2019   Daytime hypersomnolence 07/02/2019   Pulmonary hypertension (HCC) 07/02/2019   CKD (chronic kidney disease)    Bilateral pulmonary embolism (HCC) 06/18/2019    Lightheadedness 05/10/2019   Vitamin D deficiency 05/10/2019   Class 2 obesity due to excess calories with body mass index (BMI) of 37.0 to 37.9 in adult 04/16/2019   Sarcoidosis 05/04/2015   Bronchiectasis (HCC) 05/04/2015   Past Medical History:  Diagnosis Date   Acute cholecystitis 08/24/2016   Acute kidney injury superimposed on chronic kidney disease (HCC) 06/20/2019   Acute respiratory failure with hypoxia (HCC) 06/19/2019   Annual visit for general adult medical examination with abnormal findings 04/16/2019   Arthritis    right knee   Bronchiectasis (HCC)    CKD (chronic kidney disease)    Iron deficiency    Lightheadedness 05/10/2019   PE (pulmonary thromboembolism) (HCC) 06/2019   Pre-syncope 04/16/2019   Sarcoidosis    Sarcoidosis    Sleep apnea    c pap   Smoker 02/12/2014    Family History  Problem Relation Age of Onset   Hypertension Mother    Cancer Mother    Colon polyps Mother    Healthy Sister    Healthy Sister    Healthy Sister    Healthy Sister    Healthy Son    Healthy Son    Healthy Daughter    Colon cancer Neg Hx    Esophageal cancer Neg Hx    Rectal cancer Neg Hx    Stomach cancer Neg Hx     Past Surgical History:  Procedure Laterality Date   CHOLECYSTECTOMY N/A 08/25/2016   Procedure: LAPAROSCOPIC CHOLECYSTECTOMY;  Surgeon: Abigail Miyamoto, MD;  Location: [facility] OR;  Service: General;  Laterality: N/A;   TUBAL LIGATION  02/1993   Social History   Occupational History   Occupation: Administrator, [employer] ([employer])  Tobacco Use   Smoking status: Former    Packs/day: 1.00    Years: 20.00    Pack years: 20.00    Types: Cigarettes    Quit date: 03/21/2015    Years since quitting: 5.6   Smokeless tobacco: Never  Vaping Use   Vaping Use: Never used  Substance and Sexual Activity   Alcohol use: Yes    Alcohol/week: 0.0 standard drinks    Comment: Rare   Drug use: No   Sexual activity: Yes    Birth control/protection: Surgical

## 2020-11-01 NOTE — Progress Notes (Signed)
Cardiology Office Note  Date: 11/02/2020   ID: Hailey Ross, DOB [birth year], MRN [id number]  PCP:  Heather Roberts, NP  Cardiologist:  Dina Rich, MD Electrophysiologist:  None   Chief Complaint:  6-8 week follow Cardiac monitor / echocardiogram. Dizziness   History of Present Illness: Hailey Ross is a 50 y.o. female with a history of lightheadedness, IDA, sarcoidosis, sleep apnea, smoker, arthritis, CKD, respiratory failure with hypoxia, HTN, pulmonary hypertension, bilateral pulmonary embolism, sarcoidosis, palpitations, CKD stage II, pulmonary hypertension, history of PE, OSA on CPAP, HLD.  Last seen by Dr. Wyline Mood on 06/02/2020.  She did had some previous issues with palpitations with ER visit on 08/27/2019 with tachycardia and dizziness.  Episode of heart racing to 140s while driving lasting approximately 5 minutes.  Had a subsequent 30-day monitor in May 2021, no significant arrhythmias.  She was started on diltiazem 30 mg p.o. twice daily for symptoms.  Since starting diltiazem she had been doing well with no recent symptoms.  She had been followed by pulmonary for sarcoid and bronchiectasis with no significant shortness of breath.  Pulmonary hypertension had been noted on echo during PE.  Subsequent echo showed PASP had decreased.  She had mixed compliance with her CPAP.  She was compliant with her statin medications for hyperlipidemia.  Last LDL was 152.  She was last here for continued complaints of dizziness.  He stated this had been ongoing for over a year.  She recently had an MRI of the brain by neurology which was unremarkable. Previous event monitor in May 2021 showed no significant arrhythmias.  Echocardiogram June 2021 showed EF of 55 to 60%.  No WMA's.  Mildly elevated PASP of 35.3 mmHg.  LA and RA mildly dilated.,  Tricuspid regurgitation mild to moderate.  Previous carotid Doppler studies showed bilateral ICA stenosis 1 to 39% on 03/02/2020.  He stated the  dizziness could occur with or without activity.  She stated she had 1 episode where she had significant coughing episode where she nearly passed out.  She has a long history of smoking. She had pulmonary function test on 09/26/2019 with pulmonary clinic.  Spirometry was suggestive of restrictive defect.  No significant bronchodilator response.  Mild restriction with lung volume testing.  Air trapping as demonstrated by RV: TLC ratio, moderate to severe diffusion defect.  She is here today for follow-up of recent echocardiogram and cardiac monitor. Cardiac monitor showed no significant sustained arrhythmias.  She had 11 supraventricular tachycardia runs with fastest interval lasting 9 beats with a maximum rate of 148.  The longest lasting was 12 beats with an average rate of 122.  Echocardiogram demonstrated EF of 55 to 60%.  No WMA's.  Trivial MR.  At last visit her lisinopril was stopped due to dizziness and low blood pressure.  Today's blood pressure is 112/68 with a heart rate of 56.  She continues to complain of intermittent lightheadedness with some numbness and paresthesias in both upper extremities.  She denies any orthostatic symptoms.  She continues to complain of ringing in her ears.  She states she would like to be referred to the [facility] neurology clinic for the symptoms.    Past Medical History:  Diagnosis Date   Acute cholecystitis 08/24/2016   Acute kidney injury superimposed on chronic kidney disease (HCC) 06/20/2019   Acute respiratory failure with hypoxia (HCC) 06/19/2019   Annual visit for general adult medical examination with abnormal findings 04/16/2019   Arthritis  right knee   Bronchiectasis (HCC)    CKD (chronic kidney disease)    Iron deficiency    Lightheadedness 05/10/2019   PE (pulmonary thromboembolism) (HCC) 06/2019   Pre-syncope 04/16/2019   Sarcoidosis    Sarcoidosis    Sleep apnea    c pap   Smoker 02/12/2014    Past Surgical History:  Procedure Laterality Date    CHOLECYSTECTOMY N/A 08/25/2016   Procedure: LAPAROSCOPIC CHOLECYSTECTOMY;  Surgeon: Abigail Miyamoto, MD;  Location: [facility] OR;  Service: General;  Laterality: N/A;   TUBAL LIGATION  02/1993    Current Outpatient Medications  Medication Sig Dispense Refill   acetaminophen (TYLENOL) 500 MG tablet Take 500 mg by mouth every 4 (four) hours as needed for mild pain, moderate pain or headache.      albuterol (PROVENTIL) (2.5 MG/3ML) 0.083% nebulizer solution Take 3 mLs (2.5 mg total) by nebulization every 6 (six) hours as needed for wheezing or shortness of breath. 75 mL 1   albuterol (VENTOLIN HFA) 108 (90 Base) MCG/ACT inhaler INHALE 2 PUFFS INTO THE LUNGS EVERY 6 HOURS AS NEEDED FOR WHEEZING OR SHORTNESS OF BREATH 6.7 g 5   diltiazem (CARDIZEM) 30 MG tablet TAKE 1 TABLET(30 MG) BY MOUTH TWICE DAILY 180 tablet 3   lisinopril (ZESTRIL) 2.5 MG tablet Take 2.5 mg by mouth daily.     pantoprazole (PROTONIX) 40 MG tablet Take 1 tablet (40 mg total) by mouth daily. 90 tablet 3   prazosin (MINIPRESS) 1 MG capsule TAKE 1 CAPSULE(1 MG) BY MOUTH AT BEDTIME 90 capsule 1   rosuvastatin (CRESTOR) 5 MG tablet Take 1 tablet (5 mg total) by mouth daily. 90 tablet 1   Vitamin D, Ergocalciferol, (DRISDOL) 1.25 MG (50000 UNIT) CAPS capsule TAKE 1 CAPSULE BY MOUTH EVERY 7 DAYS 12 capsule 1   XARELTO 20 MG TABS tablet TAKE 1 TABLET(20 MG) BY MOUTH DAILY WITH SUPPER 90 tablet 2   No current facility-administered medications for this visit.   Allergies:  Patient has no known allergies.   Social History: The patient  reports that she quit smoking about 5 years ago. Her smoking use included cigarettes. She has a 20.00 pack-year smoking history. She has never used smokeless tobacco. She reports current alcohol use. She reports that she does not use drugs.   Family History: The patient's family history includes Cancer in her mother; Colon polyps in her mother; Healthy in her daughter, sister, sister, sister, sister, son,  and son; Hypertension in her mother.   ROS:  Please see the history of present illness. Otherwise, complete review of systems is positive for none.  All other systems are reviewed and negative.   Physical Exam: VS:  BP 112/68   Pulse (!) 56   Ht 5\' 5"  (1.651 m)   Wt 214 lb 12.8 oz (97.4 kg)   SpO2 92%   BMI 35.74 kg/m , BMI Body mass index is 35.74 kg/m.  Wt Readings from Last 3 Encounters:  11/02/20 214 lb 12.8 oz (97.4 kg)  09/29/20 214 lb 12.8 oz (97.4 kg)  09/22/20 213 lb 3.2 oz (96.7 kg)    General: Patient appears comfortable at rest. Neck: Supple, no elevated JVP or carotid bruits, no thyromegaly. Lungs: Clear to auscultation, nonlabored breathing at rest. Cardiac: Regular rate and rhythm, no S3 or significant systolic murmur, no pericardial rub. Extremities: No pitting edema, distal pulses 2+. Skin: Warm and dry. Musculoskeletal: No kyphosis. Neuropsychiatric: Alert and oriented x3, affect grossly appropriate.  ECG:  EKG  01/22/2020 normal sinus rhythm rate of 70  Recent Labwork: 09/23/2020: ALT 15; AST 22; BUN 17; Creat 1.19; Hemoglobin 12.7; Platelets 313; Potassium 4.3; Sodium 138     Component Value Date/Time   CHOL 230 (H) 04/22/2019 0839   TRIG 93 04/22/2019 0839   HDL 58 04/22/2019 0839   CHOLHDL 4.0 04/22/2019 0839   VLDL 22 02/17/2014 0831   LDLCALC 152 (H) 04/22/2019 0839    Other Studies Reviewed Today:  Cardiac monitor 10/16/2020 Study Highlights    Patch Wear Time:  13 days and 23 hours (2022-06-14T11:36:38-0400 to 2022-06-28T10:57:53-0400)   Patient had a min HR of 45 bpm, max HR of 151 bpm, and avg HR of 73 bpm. Predominant underlying rhythm was Sinus Rhythm. 11 Supraventricular Tachycardia runs occurred, the run with the fastest interval lasting 9 beats with a max rate of 148 bpm, the longest lasting 12 beats with an avg rate of 122 bpm. Isolated SVEs were rare (<1.0%), SVE Couplets were rare (<1.0%), and SVE Triplets were rare (<1.0%). Isolated  VEs were rare (<1.0%), VE Couplets were rare (<1.0%), and no VE Triplets were present.    Echocardiogram 10/19/2020  1. Left ventricular ejection fraction, by estimation, is 55 to 60%. The left ventricle has normal function. The left ventricle has no regional wall motion abnormalities. Left ventricular diastolic parameters were normal. Normal global longitudinal strain of -20.3%. 2. Right ventricular systolic function is normal. The right ventricular size is normal. There is normal pulmonary artery systolic pressure. The estimated right ventricular systolic pressure is 35.5 mmHg. 3. The mitral valve is grossly normal. Trivial mitral valve regurgitation. 4. The aortic valve is tricuspid. Aortic valve regurgitation is not visualized. 5. The inferior vena cava is normal in size with greater than 50% respiratory variability, suggesting right atrial pressure of 3 mmHg. Comparison(s): Echocardiogram done 09/13/19 showed an EF of 55-60%.      08/2019 event monitor 30 day event monitor Min HR 47, Max HR 135, Avg HR 70. Min HR occurred in early AM hours presumably while sleeping Symptoms correlate with sinus rhythm No significant arrhythmias   09/2019 echo IMPRESSIONS     1. Left ventricular ejection fraction, by estimation, is 55 to 60%. The  left ventricle has normal function. The left ventricle has no regional  wall motion abnormalities. Left ventricular diastolic parameters were  normal.   2. Right ventricular systolic function is mildly reduced. The right  ventricular size is moderately enlarged. There is mildly elevated  pulmonary artery systolic pressure. The estimated right ventricular  systolic pressure is 35.3 mmHg.   3. Left atrial size was mildly dilated.   4. Right atrial size was mildly dilated.   5. The mitral valve is normal in structure. No evidence of mitral valve  regurgitation. No evidence of mitral stenosis.   6. Tricuspid valve regurgitation is mild to moderate.    7. The aortic valve is normal in structure. Aortic valve regurgitation is  not visualized. No aortic stenosis is present.   8. The inferior vena cava is normal in size with greater than 50%  respiratory variability, suggesting right atrial pressure of 3 mmHg.        Assessment and Plan:  1. Dizziness   2. Palpitations   3. Mixed hyperlipidemia   4. SOB (shortness of breath)   5. Essential hypertension     1. Dizziness Continues to complain of lightheadedness/dizziness which is random in nature and not associated with activity.  She states it can occur at  rest or with activity.  She denies any near syncope or syncopal episodes.  However a while back she had an episode where she was coughing heavily and nearly passed out.  Recent ZIO monitor showed 11 episodes of SVT with no significant symptoms.  She continues to complain of intermittent dizziness/lightheadedness.  Please refer to [facility] neurology for evaluation.  2. Palpitations History of palpitations on Cardizem 30 mg p.o. twice daily.  States she notices occasional palpitations which are not bothersome.  Continue Cardizem 30 mg p.o. twice daily.   3. Mixed hyperlipidemia Continue Crestor 5 mg p.o. daily.  4.  Shortness of breath At last visit she continued to complain of ongoing shortness of breath.  She does have a history of sarcoidosis.  significant history of smoking.  Recent pulmonary function test Spirometry is suggestive of restrictive defect.  No significant bronchodilator response.  Mild restriction with lung volume testing.  Air trapping as demonstrated by RV: TLC ratio, moderate to severe diffusion defect.  Recent follow-up echocardiogram 10/09/2020 EF 55 to 60%.  No WMA's.  Normal PASP trivial MR  5.  Hypertension BP today 112/68.  At last visit her lisinopril was stopped and she had stopped prazosin on her own.  Continues to complain of intermittent dizziness which occurs at random.  Blood pressure improved since stopping  lisinopril at last visit.  Medication Adjustments/Labs and Tests Ordered: Current medicines are reviewed at length with the patient today.  Concerns regarding medicines are outlined above.   Disposition: Follow-up with Dr. Wyline Mood or APP 6 months  Signed, Rennis Harding, NP 11/02/2020 10:16 AM    [facility] [address] [place], [place], [place] [address] Phone: [phone]; Fax: [phone]

## 2020-11-02 ENCOUNTER — Encounter: Payer: Self-pay | Admitting: Neurology

## 2020-11-02 ENCOUNTER — Encounter: Payer: Self-pay | Admitting: Family Medicine

## 2020-11-02 ENCOUNTER — Other Ambulatory Visit: Payer: Self-pay

## 2020-11-02 ENCOUNTER — Ambulatory Visit (INDEPENDENT_AMBULATORY_CARE_PROVIDER_SITE_OTHER): Payer: 59 | Admitting: Family Medicine

## 2020-11-02 VITALS — BP 112/68 | HR 56 | Ht 65.0 in | Wt 214.8 lb

## 2020-11-02 DIAGNOSIS — R42 Dizziness and giddiness: Secondary | ICD-10-CM

## 2020-11-02 DIAGNOSIS — R200 Anesthesia of skin: Secondary | ICD-10-CM

## 2020-11-02 DIAGNOSIS — E782 Mixed hyperlipidemia: Secondary | ICD-10-CM | POA: Diagnosis not present

## 2020-11-02 DIAGNOSIS — R0602 Shortness of breath: Secondary | ICD-10-CM

## 2020-11-02 DIAGNOSIS — I10 Essential (primary) hypertension: Secondary | ICD-10-CM

## 2020-11-02 DIAGNOSIS — R002 Palpitations: Secondary | ICD-10-CM

## 2020-11-02 NOTE — Patient Instructions (Signed)
Medication Instructions:  Continue all current medications.  Labwork: none  Testing/Procedures: none  Follow-Up: 6 months  Any Other Special Instructions Will Be Listed Below (If Applicable). You have been referred to:  [facility] Neurology   If you need a refill on your cardiac medications before your next appointment, please call your pharmacy.

## 2020-11-04 ENCOUNTER — Ambulatory Visit: Payer: 59 | Admitting: Nurse Practitioner

## 2020-11-05 ENCOUNTER — Other Ambulatory Visit: Payer: Self-pay | Admitting: Internal Medicine

## 2020-11-05 DIAGNOSIS — R109 Unspecified abdominal pain: Secondary | ICD-10-CM

## 2020-11-05 DIAGNOSIS — K219 Gastro-esophageal reflux disease without esophagitis: Secondary | ICD-10-CM

## 2020-11-11 ENCOUNTER — Ambulatory Visit: Payer: 59 | Admitting: Family Medicine

## 2020-11-19 ENCOUNTER — Ambulatory Visit (INDEPENDENT_AMBULATORY_CARE_PROVIDER_SITE_OTHER): Payer: 59 | Admitting: Nurse Practitioner

## 2020-11-20 ENCOUNTER — Other Ambulatory Visit (HOSPITAL_COMMUNITY): Payer: Self-pay

## 2020-11-20 DIAGNOSIS — I2699 Other pulmonary embolism without acute cor pulmonale: Secondary | ICD-10-CM

## 2020-11-23 ENCOUNTER — Inpatient Hospital Stay (HOSPITAL_COMMUNITY): Payer: 59 | Attending: Hematology

## 2020-11-23 ENCOUNTER — Other Ambulatory Visit: Payer: Self-pay

## 2020-11-23 DIAGNOSIS — Z87891 Personal history of nicotine dependence: Secondary | ICD-10-CM | POA: Insufficient documentation

## 2020-11-23 DIAGNOSIS — Z7901 Long term (current) use of anticoagulants: Secondary | ICD-10-CM | POA: Diagnosis not present

## 2020-11-23 DIAGNOSIS — Z86711 Personal history of pulmonary embolism: Secondary | ICD-10-CM | POA: Diagnosis not present

## 2020-11-23 DIAGNOSIS — I2699 Other pulmonary embolism without acute cor pulmonale: Secondary | ICD-10-CM

## 2020-11-23 DIAGNOSIS — D869 Sarcoidosis, unspecified: Secondary | ICD-10-CM | POA: Diagnosis not present

## 2020-11-23 DIAGNOSIS — Z79899 Other long term (current) drug therapy: Secondary | ICD-10-CM | POA: Diagnosis not present

## 2020-11-23 LAB — CBC WITH DIFFERENTIAL/PLATELET
Abs Immature Granulocytes: 0.03 10*3/uL (ref 0.00–0.07)
Basophils Absolute: 0 10*3/uL (ref 0.0–0.1)
Basophils Relative: 0 %
Eosinophils Absolute: 0.2 10*3/uL (ref 0.0–0.5)
Eosinophils Relative: 2 %
HCT: 41.1 % (ref 36.0–46.0)
Hemoglobin: 12.9 g/dL (ref 12.0–15.0)
Immature Granulocytes: 0 %
Lymphocytes Relative: 33 %
Lymphs Abs: 3.3 10*3/uL (ref 0.7–4.0)
MCH: 27.7 pg (ref 26.0–34.0)
MCHC: 31.4 g/dL (ref 30.0–36.0)
MCV: 88.4 fL (ref 80.0–100.0)
Monocytes Absolute: 0.5 10*3/uL (ref 0.1–1.0)
Monocytes Relative: 5 %
Neutro Abs: 5.9 10*3/uL (ref 1.7–7.7)
Neutrophils Relative %: 60 %
Platelets: 294 10*3/uL (ref 150–400)
RBC: 4.65 MIL/uL (ref 3.87–5.11)
RDW: 13.2 % (ref 11.5–15.5)
WBC: 9.9 10*3/uL (ref 4.0–10.5)
nRBC: 0 % (ref 0.0–0.2)

## 2020-11-23 LAB — D-DIMER, QUANTITATIVE: D-Dimer, Quant: <0.27 ug/mL-FEU (ref 0.00–0.50)

## 2020-11-28 NOTE — Progress Notes (Signed)
[facility] [address], [place] [address]   CLINIC:  Medical Oncology/Hematology  PCP:  Heather Roberts, NP [address]  [address] / [place] [address]  [phone]  REASON FOR VISIT:  Follow-up for unprovoked pulmonary embolism  PRIOR THERAPY: none  CURRENT THERAPY: Xarelto  INTERVAL HISTORY:  Hailey Ross, a 50 y.o. female, returns for routine follow-up for her unprovoked pulmonary embolism. Hailey Ross was last seen on 11/26/19.  Today she reports feeling good. She denies any current bleeding issues. She has not had a menses cycle since March. She reports occasional dizziness.   REVIEW OF SYSTEMS:  Review of Systems  Constitutional:  Negative for appetite change and fatigue.  HENT:   Negative for nosebleeds.   Gastrointestinal:  Negative for blood in stool.  Genitourinary:  Negative for hematuria.   Neurological:  Positive for dizziness (occasional) and numbness (occasional).  Hematological:  Does not bruise/bleed easily.  Psychiatric/Behavioral:  Positive for sleep disturbance (occasional).   All other systems reviewed and are negative.  PAST MEDICAL/SURGICAL HISTORY:  Past Medical History:  Diagnosis Date   Acute cholecystitis 08/24/2016   Acute kidney injury superimposed on chronic kidney disease (HCC) 06/20/2019   Acute respiratory failure with hypoxia (HCC) 06/19/2019   Annual visit for general adult medical examination with abnormal findings 04/16/2019   Arthritis    right knee   Bronchiectasis (HCC)    CKD (chronic kidney disease)    Iron deficiency    Lightheadedness 05/10/2019   PE (pulmonary thromboembolism) (HCC) 06/2019   Pre-syncope 04/16/2019   Sarcoidosis    Sarcoidosis    Sleep apnea    c pap   Smoker 02/12/2014   Past Surgical History:  Procedure Laterality Date   CHOLECYSTECTOMY N/A 08/25/2016   Procedure: LAPAROSCOPIC CHOLECYSTECTOMY;  Surgeon: Abigail Miyamoto, MD;  Location: [facility] OR;  Service: General;   Laterality: N/A;   TUBAL LIGATION  02/1993    SOCIAL HISTORY:  Social History   Socioeconomic History   Marital status: Single    Spouse name: Not on file   Number of children: 3   Years of education: Not on file   Highest education level: High school graduate  Occupational History   Occupation: Administrator, [employer] ([employer])  Tobacco Use   Smoking status: Former    Packs/day: 1.00    Years: 20.00    Pack years: 20.00    Types: Cigarettes    Quit date: 03/21/2015    Years since quitting: 5.6   Smokeless tobacco: Never  Vaping Use   Vaping Use: Never used  Substance and Sexual Activity   Alcohol use: Yes    Alcohol/week: 0.0 standard drinks    Comment: Rare   Drug use: No   Sexual activity: Yes    Birth control/protection: Surgical  Other Topics Concern   Not on file  Social History Narrative   Live with partners [name]-19 years   3 children.    1-[name] (girl) 27 at home with her: 2 grandchildren in her home as well    [name]- 26 expecting   [name]-25      Enjoy: read, play games on tablet, grandkids      Diet: Veggies, does not eat a lot of fried foods, enjoys chicken and fruit   Caffeine: sodas and coffee (with sugar)   Water: Does not drink a lot at all      Wears seat beat   Does not wear sunscreen  Smoke and carbon monoxide detectors   Does not use phone while driving    Social Determinants of Corporate investment banker Strain: Not on file  Food Insecurity: Not on file  Transportation Needs: Not on file  Physical Activity: Not on file  Stress: Not on file  Social Connections: Not on file  Intimate Partner Violence: Not on file    FAMILY HISTORY:  Family History  Problem Relation Age of Onset   Hypertension Mother    Cancer Mother    Colon polyps Mother    Healthy Sister    Healthy Sister    Healthy Sister    Healthy Sister    Healthy Son    Healthy Son    Healthy Daughter    Colon cancer Neg Hx    Esophageal cancer Neg Hx    Rectal cancer  Neg Hx    Stomach cancer Neg Hx     CURRENT MEDICATIONS:  Current Outpatient Medications  Medication Sig Dispense Refill   acetaminophen (TYLENOL) 500 MG tablet Take 500 mg by mouth every 4 (four) hours as needed for mild pain, moderate pain or headache.      albuterol (PROVENTIL) (2.5 MG/3ML) 0.083% nebulizer solution Take 3 mLs (2.5 mg total) by nebulization every 6 (six) hours as needed for wheezing or shortness of breath. 75 mL 1   albuterol (VENTOLIN HFA) 108 (90 Base) MCG/ACT inhaler INHALE 2 PUFFS INTO THE LUNGS EVERY 6 HOURS AS NEEDED FOR WHEEZING OR SHORTNESS OF BREATH 6.7 g 5   diltiazem (CARDIZEM) 30 MG tablet TAKE 1 TABLET(30 MG) BY MOUTH TWICE DAILY 180 tablet 3   pantoprazole (PROTONIX) 40 MG tablet Take 1 tablet (40 mg total) by mouth daily. 90 tablet 3   rosuvastatin (CRESTOR) 5 MG tablet Take 1 tablet (5 mg total) by mouth daily. 90 tablet 1   Vitamin D, Ergocalciferol, (DRISDOL) 1.25 MG (50000 UNIT) CAPS capsule TAKE 1 CAPSULE BY MOUTH EVERY 7 DAYS 12 capsule 1   XARELTO 20 MG TABS tablet TAKE 1 TABLET(20 MG) BY MOUTH DAILY WITH SUPPER 90 tablet 2   No current facility-administered medications for this visit.    ALLERGIES:  No Known Allergies  PHYSICAL EXAM:  Performance status (ECOG): 0 - Asymptomatic  There were no vitals filed for this visit. Wt Readings from Last 3 Encounters:  11/02/20 214 lb 12.8 oz (97.4 kg)  09/29/20 214 lb 12.8 oz (97.4 kg)  09/22/20 213 lb 3.2 oz (96.7 kg)   Physical Exam Vitals reviewed.  Constitutional:      Appearance: Normal appearance.  Cardiovascular:     Rate and Rhythm: Normal rate and regular rhythm.     Pulses: Normal pulses.     Heart sounds: Normal heart sounds.  Pulmonary:     Effort: Pulmonary effort is normal.     Breath sounds: Normal breath sounds.  Neurological:     General: No focal deficit present.     Mental Status: She is alert and oriented to person, place, and time.  Psychiatric:        Mood and Affect:  Mood normal.        Behavior: Behavior normal.    LABORATORY DATA:  I have reviewed the labs as listed.  CBC Latest Ref Rng & Units 11/23/2020 09/23/2020 06/25/2020  WBC 4.0 - 10.5 K/uL 9.9 10.2 10.8  Hemoglobin 12.0 - 15.0 g/dL 31.5 40.0 86.7  Hematocrit 36.0 - 46.0 % 41.1 39.7 36.3  Platelets 150 - 400 K/uL  294 313 331   CMP Latest Ref Rng & Units 09/23/2020 06/25/2020 05/15/2020  Glucose 65 - 99 mg/dL 93 76 73  BUN 7 - 25 mg/dL 17 14 16   Creatinine 0.50 - 1.05 mg/dL ) 3.54(S) 5.68(L)  Sodium 135 - 146 mmol/L 138 140 138  Potassium 3.5 - 5.3 mmol/L 4.3 4.3 3.9  Chloride 98 - 110 mmol/L 105 105 104  CO2 20 - 32 mmol/L 26 27 27   Calcium 8.6 - 10.4 mg/dL 9.1 9.0 8.9  Total Protein 6.1 - 8.1 g/dL 7.0 6.3 6.5  Total Bilirubin 0.2 - 1.2 mg/dL 0.4 0.4 0.4  Alkaline Phos 38 - 126 U/L - - -  AST 10 - 35 U/L 22 14 20   ALT 6 - 29 U/L 15 11 15       Component Value Date/Time   RBC 4.65 11/23/2020 1054   MCV 88.4 11/23/2020 1054   MCH 27.7 11/23/2020 1054   MCHC 31.4 11/23/2020 1054   RDW 13.2 11/23/2020 1054   LYMPHSABS 3.3 11/23/2020 1054   MONOABS 0.5 11/23/2020 1054   EOSABS 0.2 11/23/2020 1054   BASOSABS 0.0 11/23/2020 1054    DIAGNOSTIC IMAGING:  I have independently reviewed the scans and discussed with the patient. No results found.   ASSESSMENT:  1.  Unprovoked bilateral pulmonary embolism: -Presentation with right knee pain and swelling and on and off lightheadedness since January 2021.  Presented to the ER on 06/18/2019 with worsening lightheadedness and coughing up blood-streaked sputum. -CT angiogram on 06/18/2019 showed peripheral segmental and subsegmental bilateral pulmonary emboli with no right heart strain.  Bulky mediastinal and hilar lymph nodes and pulmonary findings consistent with history of sarcoidosis. -Bilateral leg Doppler was negative on the same day for DVT. -She is taking Xarelto without any bleeding issues.  She quit smoking 5 years ago and smoked 1  pack/day for 22 years. -Lupus anticoagulant was positive x1, negative on repeat testing.  Anticardiolipin antibodies negative.  Factor V Leiden and PT gene negative. -Indefinite anticoagulation was recommended.   2.  Sarcoidosis: -Diagnosed in January 2018, treated with prednisone for 1 and half year.  She is weaned off now and uses inhaler.  She follows up with Dr. February 2021.   3.  Decreased right ear hearing: -She reports buzzing in the ears on and off and decreased right ear hearing.  She has used meclizine in the past without help. -Referral made to ENT.   PLAN:  1.  Unprovoked bilateral pulmonary embolism: - She is continuing Xarelto without any major issues. - She did not have any periods since March. - We reviewed her labs today which showed normal CBC.  D-dimer was undetectable. - She will continue Xarelto indefinitely as benefits of continuation outweigh risks at this time. - RTC clinic in 1 year with repeat labs. - She has occasional dizziness which started even prior to starting Xarelto.  She was evaluated by cardiology and a referral to neurology was made.   2.  Positive ANA: -Continue follow-up with Dr. 08/18/2019.   3.  CKD: - Continue follow-up with Dr. 01-11-1999.  Orders placed this encounter:  No orders of the defined types were placed in this encounter.    Craige Cotta, MD [facility] [phone]   I, Wolfgang Phoenix, am acting as a scribe for Dr. Doreatha Massed.  I, [facility] MD, have reviewed the above documentation for accuracy and completeness, and I agree with the above.

## 2020-11-30 ENCOUNTER — Other Ambulatory Visit: Payer: Self-pay

## 2020-11-30 ENCOUNTER — Inpatient Hospital Stay (HOSPITAL_BASED_OUTPATIENT_CLINIC_OR_DEPARTMENT_OTHER): Payer: 59 | Admitting: Hematology

## 2020-11-30 ENCOUNTER — Other Ambulatory Visit (HOSPITAL_COMMUNITY): Payer: Self-pay | Admitting: *Deleted

## 2020-11-30 VITALS — BP 131/66 | HR 63 | Temp 98.2°F | Resp 20 | Wt 215.7 lb

## 2020-11-30 DIAGNOSIS — Z86711 Personal history of pulmonary embolism: Secondary | ICD-10-CM | POA: Diagnosis not present

## 2020-11-30 DIAGNOSIS — I2699 Other pulmonary embolism without acute cor pulmonale: Secondary | ICD-10-CM | POA: Diagnosis not present

## 2020-11-30 NOTE — Patient Instructions (Addendum)
[facility] at [facility] Discharge Instructions  You were seen today by Dr. Katragadda. He went over your recent results. Dr. Katragadda will see you back in 1 year for labs and follow up.   Thank you for choosing [facility] at [facility] to provide your oncology and hematology care.  To afford each patient quality time with our provider, please arrive at least 15 minutes before your scheduled appointment time.   If you have a lab appointment with the Cancer Center please come in thru the Main Entrance and check in at the main information desk  You need to re-schedule your appointment should you arrive 10 or more minutes late.  We strive to give you quality time with our providers, and arriving late affects you and other patients whose appointments are after yours.  Also, if you no show three or more times for appointments you may be dismissed from the clinic at the providers discretion.     Again, thank you for choosing [facility].  Our hope is that these requests will decrease the amount of time that you wait before being seen by our physicians.       _____________________________________________________________  Should you have questions after your visit to [facility], please contact our office at [phone] between the hours of 8:00 a.m. and 4:30 p.m.  Voicemails left after 4:00 p.m. will not be returned until the following business day.  For prescription refill requests, have your pharmacy contact our office and allow 72 hours.    Cancer Center Support Programs:   > Cancer Support Group  2nd Tuesday of the month 1pm-2pm, Journey Room    

## 2020-12-15 ENCOUNTER — Telehealth: Payer: Self-pay | Admitting: Pulmonary Disease

## 2020-12-15 MED ORDER — MOLNUPIRAVIR EUA 200MG CAPSULE: 4.0000 | ORAL_CAPSULE | Freq: Two times a day (BID) | ORAL | 0 refills | Status: AC

## 2020-12-15 NOTE — Telephone Encounter (Signed)
VS patient last seen TP on 09/03/2020 for sarcoidosis.  Spoke to patient, who stated that she tested positive on 12/11/2020. C/o mild throat pain, prod cough with green sputum mainly in the morning, chills and mild body aches. Sx started on 12/10/2020. Denied sob, fever, wheezing or additional sx.  She had not needed to use albuterol.  Using tylenol and Nyquil. Patient stated that he sx are mild and she had not felt very bad. She just wanted to make Dr. Craige Cotta aware.  Tammy, please advise. Dr. Craige Cotta is unavailable.

## 2020-12-15 NOTE — Telephone Encounter (Signed)
Day 5 of sx, sx persisting . Covid vaccine x 3  Hx of sarcoid .  No hemoptysis , or chest pain  Denies pregnancy .  Has chronic kidney dz and potential drug interactions for PAX .  Rec: Molnupiravir x 5 days , rx sent .   Continue with supportive care with mucinex, tylenol, rest and fluids .  F/up in 3 weeks as planned and As needed   Please contact office for sooner follow up if symptoms do not improve or worsen or seek emergency care    Send to Dr. Craige Cotta  for [facility] .

## 2020-12-17 ENCOUNTER — Ambulatory Visit: Payer: 59 | Admitting: Nurse Practitioner

## 2020-12-18 ENCOUNTER — Emergency Department (HOSPITAL_BASED_OUTPATIENT_CLINIC_OR_DEPARTMENT_OTHER)
Admission: EM | Admit: 2020-12-18 | Discharge: 2020-12-18 | Disposition: A | Discharge status: Home / Self Care | Payer: 59 | Attending: Emergency Medicine | Admitting: Emergency Medicine

## 2020-12-18 ENCOUNTER — Other Ambulatory Visit: Payer: Self-pay

## 2020-12-18 ENCOUNTER — Emergency Department (HOSPITAL_BASED_OUTPATIENT_CLINIC_OR_DEPARTMENT_OTHER): Payer: 59 | Admitting: Radiology

## 2020-12-18 ENCOUNTER — Encounter (HOSPITAL_BASED_OUTPATIENT_CLINIC_OR_DEPARTMENT_OTHER): Payer: Self-pay | Admitting: Emergency Medicine

## 2020-12-18 DIAGNOSIS — Z8616 Personal history of COVID-19: Secondary | ICD-10-CM | POA: Diagnosis not present

## 2020-12-18 DIAGNOSIS — Z7901 Long term (current) use of anticoagulants: Secondary | ICD-10-CM | POA: Insufficient documentation

## 2020-12-18 DIAGNOSIS — R072 Precordial pain: Secondary | ICD-10-CM | POA: Diagnosis not present

## 2020-12-18 DIAGNOSIS — Z87891 Personal history of nicotine dependence: Secondary | ICD-10-CM | POA: Diagnosis not present

## 2020-12-18 DIAGNOSIS — R053 Chronic cough: Secondary | ICD-10-CM | POA: Insufficient documentation

## 2020-12-18 DIAGNOSIS — I129 Hypertensive chronic kidney disease with stage 1 through stage 4 chronic kidney disease, or unspecified chronic kidney disease: Secondary | ICD-10-CM | POA: Diagnosis not present

## 2020-12-18 DIAGNOSIS — N182 Chronic kidney disease, stage 2 (mild): Secondary | ICD-10-CM | POA: Insufficient documentation

## 2020-12-18 DIAGNOSIS — R079 Chest pain, unspecified: Secondary | ICD-10-CM

## 2020-12-18 LAB — CBC WITH DIFFERENTIAL/PLATELET
Abs Immature Granulocytes: 0.03 10*3/uL (ref 0.00–0.07)
Basophils Absolute: 0 10*3/uL (ref 0.0–0.1)
Basophils Relative: 0 %
Eosinophils Absolute: 0.1 10*3/uL (ref 0.0–0.5)
Eosinophils Relative: 1 %
HCT: 41.3 % (ref 36.0–46.0)
Hemoglobin: 13.2 g/dL (ref 12.0–15.0)
Immature Granulocytes: 0 %
Lymphocytes Relative: 24 %
Lymphs Abs: 2.9 10*3/uL (ref 0.7–4.0)
MCH: 27.2 pg (ref 26.0–34.0)
MCHC: 32 g/dL (ref 30.0–36.0)
MCV: 85.2 fL (ref 80.0–100.0)
Monocytes Absolute: 0.7 10*3/uL (ref 0.1–1.0)
Monocytes Relative: 5 %
Neutro Abs: 8.3 10*3/uL — ABNORMAL HIGH (ref 1.7–7.7)
Neutrophils Relative %: 70 %
Platelets: 293 10*3/uL (ref 150–400)
RBC: 4.85 MIL/uL (ref 3.87–5.11)
RDW: 13.6 % (ref 11.5–15.5)
WBC: 12.1 10*3/uL — ABNORMAL HIGH (ref 4.0–10.5)
nRBC: 0 % (ref 0.0–0.2)

## 2020-12-18 LAB — COMPREHENSIVE METABOLIC PANEL
ALT: 20 U/L (ref 0–44)
AST: 26 U/L (ref 15–41)
Albumin: 4.2 g/dL (ref 3.5–5.0)
Alkaline Phosphatase: 69 U/L (ref 38–126)
Anion gap: 8 (ref 5–15)
BUN: 14 mg/dL (ref 6–20)
CO2: 28 mmol/L (ref 22–32)
Calcium: 9.3 mg/dL (ref 8.9–10.3)
Chloride: 104 mmol/L (ref 98–111)
Creatinine, Ser: 1.26 mg/dL — ABNORMAL HIGH (ref 0.44–1.00)
GFR, Estimated: 52 mL/min — ABNORMAL LOW (ref >60)
Glucose, Bld: 87 mg/dL (ref 70–99)
Potassium: 3.5 mmol/L (ref 3.5–5.1)
Sodium: 140 mmol/L (ref 135–145)
Total Bilirubin: 0.5 mg/dL (ref 0.3–1.2)
Total Protein: 7.6 g/dL (ref 6.5–8.1)

## 2020-12-18 LAB — TROPONIN I (HIGH SENSITIVITY)
Troponin I (High Sensitivity): 5 ng/L (ref <18)
Troponin I (High Sensitivity): 5 ng/L (ref <18)

## 2020-12-18 NOTE — ED Provider Notes (Signed)
[facility] EMERGENCY DEPT Provider Note   CSN: [id number] Arrival date & time: 12/18/20  9242     History Chief Complaint  Patient presents with   Chest Pain    Hailey Ross is a 50 y.o. female.  Hailey Ross presents with very minor chest pain.  A week ago she tested positive for COVID-19 and did take antiviral treatment.  She states that she really did not have any severe symptoms.  No change in her daily cough.  Yesterday evening, she developed some sensations at the center of her chest.  She has had no other associated symptoms.  She reports that she has been taking her Xarelto regularly.  The history is provided by the patient.  Chest Pain Pain location:  Substernal area Pain quality comment:  Just a "sensation, not really a tightness" Pain radiates to:  Does not radiate Pain severity:  Mild Onset quality:  Sudden Duration:  12 hours Timing:  Intermittent Progression:  Unchanged Chronicity:  New Context: at rest   Relieved by:  Nothing Worsened by:  Nothing Ineffective treatments:  None tried Associated symptoms: cough (chronic, has not changed)   Associated symptoms: no abdominal pain, no back pain, no fever, no lower extremity edema, no nausea, no palpitations, no shortness of breath and no vomiting   Risk factors: prior DVT/PE       Past Medical History:  Diagnosis Date   Acute cholecystitis 08/24/2016   Acute kidney injury superimposed on chronic kidney disease (HCC) 06/20/2019   Acute respiratory failure with hypoxia (HCC) 06/19/2019   Annual visit for general adult medical examination with abnormal findings 04/16/2019   Arthritis    right knee   Bronchiectasis (HCC)    CKD (chronic kidney disease)    Iron deficiency    Lightheadedness 05/10/2019   PE (pulmonary thromboembolism) (HCC) 06/2019   Pre-syncope 04/16/2019   Sarcoidosis    Sarcoidosis    Sleep apnea    c pap   Smoker 02/12/2014    Patient Active Problem List    Diagnosis Date Noted   Primary osteoarthritis of left knee 10/27/2020   Grief at loss of child 06/09/2020   Insomnia 06/09/2020   Right ankle swelling 02/05/2020   Encounter for examination following treatment at hospital 01/28/2020   Palpitations 01/28/2020   Menorrhagia with regular cycle 10/15/2019   OSA (obstructive sleep apnea) 10/08/2019   Right knee pain 08/29/2019   Positive ANA (antinuclear antibody) 08/29/2019   Essential hypertension 07/04/2019   Pain and swelling of right knee 07/04/2019   Hyperlipidemia 07/04/2019   Stage 2 chronic kidney disease 07/04/2019   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   Encounter for support and coordination of transition of care 07/04/2019   Daytime hypersomnolence 07/02/2019   Pulmonary hypertension (HCC) 07/02/2019   CKD (chronic kidney disease)    Bilateral pulmonary embolism (HCC) 06/18/2019   Lightheadedness 05/10/2019   Vitamin D deficiency 05/10/2019   Class 2 obesity due to excess calories with body mass index (BMI) of 37.0 to 37.9 in adult 04/16/2019   Sarcoidosis 05/04/2015   Bronchiectasis (HCC) 05/04/2015    Past Surgical History:  Procedure Laterality Date   CHOLECYSTECTOMY N/A 08/25/2016   Procedure: LAPAROSCOPIC CHOLECYSTECTOMY;  Surgeon: Abigail Miyamoto, MD;  Location: [facility] OR;  Service: General;  Laterality: N/A;   TUBAL LIGATION  02/1993     OB History     Gravida  3   Para  3   Term  3   Preterm  AB      Living  3      SAB      IAB      Ectopic      Multiple      Live Births              Family History  Problem Relation Age of Onset   Hypertension Mother    Cancer Mother    Colon polyps Mother    Healthy Sister    Healthy Sister    Healthy Sister    Healthy Sister    Healthy Son    Healthy Son    Healthy Daughter    Colon cancer Neg Hx    Esophageal cancer Neg Hx    Rectal cancer Neg Hx    Stomach cancer Neg Hx     Social History   Tobacco Use   Smoking status: Former     Packs/day: 1.00    Years: 20.00    Pack years: 20.00    Types: Cigarettes    Quit date: 03/21/2015    Years since quitting: 5.7   Smokeless tobacco: Never  Vaping Use   Vaping Use: Never used  Substance Use Topics   Alcohol use: Yes    Alcohol/week: 0.0 standard drinks    Comment: Rare   Drug use: No    Home Medications Prior to Admission medications   Medication Sig Start Date End Date Taking? Authorizing Provider  acetaminophen (TYLENOL) 500 MG tablet Take 500 mg by mouth every 4 (four) hours as needed for mild pain, moderate pain or headache.     [provider]  albuterol (PROVENTIL) (2.5 MG/3ML) 0.083% nebulizer solution Take 3 mLs (2.5 mg total) by nebulization every 6 (six) hours as needed for wheezing or shortness of breath. 07/22/19   Parrett, Virgel Bouquetammy S, NP  albuterol (VENTOLIN HFA) 108 (90 Base) MCG/ACT inhaler INHALE 2 PUFFS INTO THE LUNGS EVERY 6 HOURS AS NEEDED FOR WHEEZING OR SHORTNESS OF BREATH 07/09/20   Coralyn HellingSood, Vineet, MD  diltiazem (CARDIZEM) 30 MG tablet TAKE 1 TABLET(30 MG) BY MOUTH TWICE DAILY 08/21/20   Antoine PocheBranch, Jonathan F, MD  molnupiravir EUA 200 mg CAPS Take 4 capsules (800 mg total) by mouth 2 (two) times daily for 5 days. 12/15/20 12/20/20  Parrett, Virgel Bouquetammy S, NP  pantoprazole (PROTONIX) 40 MG tablet Take 1 tablet (40 mg total) by mouth daily. 11/12/19   Hilarie FredricksonPerry, John N, MD  rosuvastatin (CRESTOR) 5 MG tablet Take 1 tablet (5 mg total) by mouth daily. 06/08/20   Kerri PerchesSimpson, Margaret E, MD  traZODone (DESYREL) 50 MG tablet Take 50-100 mg by mouth at bedtime. 11/05/20   [provider]  Vitamin D, Ergocalciferol, (DRISDOL) 1.25 MG (50000 UNIT) CAPS capsule TAKE 1 CAPSULE BY MOUTH EVERY 7 DAYS 06/23/20   Heather RobertsGray, Joseph M, NP  XARELTO 20 MG TABS tablet TAKE 1 TABLET(20 MG) BY MOUTH DAILY WITH SUPPER 08/27/20   Coralyn HellingSood, Vineet, MD    Allergies    Patient has no known allergies.  Review of Systems   Review of Systems  Constitutional:  Negative for chills and fever.   HENT:  Negative for ear pain and sore throat.   Eyes:  Negative for pain and visual disturbance.  Respiratory:  Positive for cough (chronic, has not changed). Negative for shortness of breath.   Cardiovascular:  Positive for chest pain. Negative for palpitations.  Gastrointestinal:  Negative for abdominal pain, nausea and vomiting.  Genitourinary:  Negative for dysuria and hematuria.  Musculoskeletal:  Negative for arthralgias and back pain.  Skin:  Negative for color change and rash.  Neurological:  Negative for seizures and syncope.  All other systems reviewed and are negative.  Physical Exam Updated Vital Signs BP (!) 137/91 (BP Location: Right Arm)   Pulse 68   Temp 98.6 F (37 C) (Oral)   Resp 17   Ht 5\' 5"  (1.651 m)   Wt 95.7 kg   BMI 35.11 kg/m   Physical Exam Vitals and nursing note reviewed.  Constitutional:      Appearance: She is well-developed.  HENT:     Head: Normocephalic and atraumatic.  Cardiovascular:     Rate and Rhythm: Normal rate and regular rhythm.     Heart sounds: Normal heart sounds.  Pulmonary:     Effort: Pulmonary effort is normal. No tachypnea.     Breath sounds: Normal breath sounds.  Abdominal:     Palpations: Abdomen is soft.     Tenderness: There is no abdominal tenderness.  Musculoskeletal:     Right lower leg: No edema.     Left lower leg: No edema.  Skin:    General: Skin is warm and dry.  Neurological:     General: No focal deficit present.     Mental Status: She is alert and oriented to person, place, and time.  Psychiatric:        Mood and Affect: Mood normal.        Behavior: Behavior normal.    ED Results / Procedures / Treatments   Labs (all labs ordered are listed, but only abnormal results are displayed) Labs Reviewed  COMPREHENSIVE METABOLIC PANEL - Abnormal; Notable for the following components:      Result Value   Creatinine, Ser 1.26 (*)    GFR, Estimated 52 (*)    All other components within normal limits   CBC WITH DIFFERENTIAL/PLATELET - Abnormal; Notable for the following components:   WBC 12.1 (*)    Neutro Abs 8.3 (*)    All other components within normal limits  TROPONIN I (HIGH SENSITIVITY)  TROPONIN I (HIGH SENSITIVITY)    EKG EKG Interpretation  Date/Time:  Friday December 18 2020 08:16:13 EDT Ventricular Rate:  64 PR Interval:  157 QRS Duration: 103 QT Interval:  397 QTC Calculation: 410 R Axis:   21 Text Interpretation: Sinus rhythm Low voltage, precordial leads RSR' in V1 or V2, right VCD or RVH No acute ischemia QTc normal Confirmed by [id number] ([id number]) on 12/18/2020 8:22:25 AM  Radiology DG Chest 2 View  Result Date: 12/18/2020 CLINICAL DATA:  50 year old female with chest pain, chest tightness. Smoker. EXAM: CHEST - 2 VIEW COMPARISON:  Chest radiographs 01/22/2020 and earlier. FINDINGS: Chronic lung disease with areas of apical and other subpleural scarring and honeycombing demonstrated by CTA last year. Mediastinal contours are stable and within normal limits. Visualized tracheal air column is within normal limits. No pneumothorax, pulmonary edema, pleural effusion or acute pulmonary opacity. No acute osseous abnormality identified. Stable cholecystectomy clips. Negative visible bowel gas pattern. IMPRESSION: Chronic lung disease. No acute cardiopulmonary abnormality. Electronically Signed   By: 01/24/2020 M.D.   On: 12/18/2020 09:28    Procedures Procedures   Medications Ordered in ED Medications - No data to display  ED Course  I have reviewed the triage vital signs and the nursing notes.  Pertinent labs & imaging results that were available during my care of the patient were reviewed by me and considered in  my medical decision making (see chart for details).    MDM Rules/Calculators/A&P                           Vivi Ferns presents with chest pain.  She has a history of PE, on chronic anticoagulation secondary to positive lupus anticoagulant.  She also  has sarcoidosis, and she recently had COVID-19.  She was evaluated for evidence of pneumonia, ACS, CHF.  ED work-up was within normal limits.  I did not evaluate her for PE.  Normal oxygen saturation even with ambulation.  Admits to being compliant with her Xarelto.  No indication of right heart strain with labs. Final Clinical Impression(s) / ED Diagnoses Final diagnoses:  Chest pain, unspecified type    Rx / DC Orders ED Discharge Orders     None        Koleen Distance, MD 12/18/20 1317

## 2020-12-18 NOTE — ED Notes (Signed)
RT Note: Pt. ambulated around ED while on room air per order, HR remained 84-89 with Oxygen saturations remaining 93-95%, denied any shortness of breath, RT Assessment completed.

## 2020-12-18 NOTE — ED Triage Notes (Signed)
Pt from home c/o tightness in her chest that is generalized in her whole abdominal region and lower portion of her chest. Pt stated she had blood clots last year. Pt does not have a pain scale rating other than chest tightness.

## 2020-12-22 ENCOUNTER — Other Ambulatory Visit: Payer: Self-pay

## 2020-12-22 DIAGNOSIS — E785 Hyperlipidemia, unspecified: Secondary | ICD-10-CM

## 2020-12-22 DIAGNOSIS — E559 Vitamin D deficiency, unspecified: Secondary | ICD-10-CM

## 2020-12-22 MED ORDER — VITAMIN D (ERGOCALCIFEROL) 1.25 MG (50000 UNIT) PO CAPS: 50000.0000 [IU] | ORAL_CAPSULE | ORAL | 1 refills | Status: DC

## 2020-12-22 MED ORDER — ROSUVASTATIN CALCIUM 5 MG PO TABS: 5.0000 mg | ORAL_TABLET | Freq: Every day | ORAL | 1 refills | Status: DC

## 2020-12-23 ENCOUNTER — Emergency Department (HOSPITAL_COMMUNITY)
Admission: EM | Admit: 2020-12-23 | Discharge: 2020-12-23 | Disposition: A | Discharge status: Home / Self Care | Payer: 59 | Attending: Emergency Medicine | Admitting: Emergency Medicine

## 2020-12-23 ENCOUNTER — Encounter: Payer: 59 | Admitting: Nurse Practitioner

## 2020-12-23 ENCOUNTER — Emergency Department (HOSPITAL_COMMUNITY): Payer: 59

## 2020-12-23 ENCOUNTER — Encounter (HOSPITAL_COMMUNITY): Payer: Self-pay | Admitting: Emergency Medicine

## 2020-12-23 ENCOUNTER — Other Ambulatory Visit: Payer: Self-pay

## 2020-12-23 DIAGNOSIS — N182 Chronic kidney disease, stage 2 (mild): Secondary | ICD-10-CM | POA: Diagnosis not present

## 2020-12-23 DIAGNOSIS — I129 Hypertensive chronic kidney disease with stage 1 through stage 4 chronic kidney disease, or unspecified chronic kidney disease: Secondary | ICD-10-CM | POA: Diagnosis not present

## 2020-12-23 DIAGNOSIS — R42 Dizziness and giddiness: Secondary | ICD-10-CM | POA: Diagnosis not present

## 2020-12-23 DIAGNOSIS — R0789 Other chest pain: Secondary | ICD-10-CM | POA: Diagnosis not present

## 2020-12-23 DIAGNOSIS — Z87891 Personal history of nicotine dependence: Secondary | ICD-10-CM | POA: Diagnosis not present

## 2020-12-23 LAB — CBC WITH DIFFERENTIAL/PLATELET
Abs Immature Granulocytes: 0.05 10*3/uL (ref 0.00–0.07)
Basophils Absolute: 0 10*3/uL (ref 0.0–0.1)
Basophils Relative: 0 %
Eosinophils Absolute: 0.1 10*3/uL (ref 0.0–0.5)
Eosinophils Relative: 1 %
HCT: 41.2 % (ref 36.0–46.0)
Hemoglobin: 12.7 g/dL (ref 12.0–15.0)
Immature Granulocytes: 1 %
Lymphocytes Relative: 26 %
Lymphs Abs: 2.4 10*3/uL (ref 0.7–4.0)
MCH: 27.3 pg (ref 26.0–34.0)
MCHC: 30.8 g/dL (ref 30.0–36.0)
MCV: 88.6 fL (ref 80.0–100.0)
Monocytes Absolute: 0.5 10*3/uL (ref 0.1–1.0)
Monocytes Relative: 6 %
Neutro Abs: 6 10*3/uL (ref 1.7–7.7)
Neutrophils Relative %: 66 %
Platelets: 293 10*3/uL (ref 150–400)
RBC: 4.65 MIL/uL (ref 3.87–5.11)
RDW: 13.7 % (ref 11.5–15.5)
WBC: 9 10*3/uL (ref 4.0–10.5)
nRBC: 0 % (ref 0.0–0.2)

## 2020-12-23 LAB — COMPREHENSIVE METABOLIC PANEL
ALT: 18 U/L (ref 0–44)
AST: 21 U/L (ref 15–41)
Albumin: 3.6 g/dL (ref 3.5–5.0)
Alkaline Phosphatase: 66 U/L (ref 38–126)
Anion gap: 7 (ref 5–15)
BUN: 14 mg/dL (ref 6–20)
CO2: 25 mmol/L (ref 22–32)
Calcium: 8.7 mg/dL — ABNORMAL LOW (ref 8.9–10.3)
Chloride: 108 mmol/L (ref 98–111)
Creatinine, Ser: 1.11 mg/dL — ABNORMAL HIGH (ref 0.44–1.00)
GFR, Estimated: >60 mL/min (ref >60)
Glucose, Bld: 88 mg/dL (ref 70–99)
Potassium: 4.1 mmol/L (ref 3.5–5.1)
Sodium: 140 mmol/L (ref 135–145)
Total Bilirubin: 0.6 mg/dL (ref 0.3–1.2)
Total Protein: 6.8 g/dL (ref 6.5–8.1)

## 2020-12-23 LAB — TROPONIN I (HIGH SENSITIVITY): Troponin I (High Sensitivity): 4 ng/L (ref <18)

## 2020-12-23 MED ORDER — SODIUM CHLORIDE 0.9 % IV BOLUS
500.0000 mL | Freq: Once | INTRAVENOUS | Status: AC
  Administered 2020-12-23: 500 mL via INTRAVENOUS

## 2020-12-23 MED ORDER — IOHEXOL 350 MG/ML SOLN
100.0000 mL | Freq: Once | INTRAVENOUS | Status: AC | PRN
  Administered 2020-12-23: 80 mL via INTRAVENOUS

## 2020-12-23 NOTE — ED Provider Notes (Signed)
Emergency Department Provider Note   I have reviewed the triage vital signs and the nursing notes.   HISTORY  Chief Complaint Dizziness   HPI Hailey Ross is a 50 y.o. female with past medical history reviewed below including prior PE on chronic anticoagulation with lupus returns to the emergency department with lightheadedness and "sensations" through her epigastric area radiating into the chest. She feels lightheaded with episodes but denies full syncope. She has had a week of intermittent symptoms and states she had similar episodes prior to her initial diagnosis of PE, which has her concerned.  She is strictly compliant with her Xarelto.  She was seen in the emergency department on 9/9 with similar symptoms.  She had reassuring lab work and exam at that time and was discharged.  She states she continued to have symptoms which prompted her return.  She is not having any unilateral leg pain or swelling.  No other clear modifying factors although does seem improved at rest.    Past Medical History:  Diagnosis Date   Acute cholecystitis 08/24/2016   Acute kidney injury superimposed on chronic kidney disease (HCC) 06/20/2019   Acute respiratory failure with hypoxia (HCC) 06/19/2019   Annual visit for general adult medical examination with abnormal findings 04/16/2019   Arthritis    right knee   Bronchiectasis (HCC)    CKD (chronic kidney disease)    Iron deficiency    Lightheadedness 05/10/2019   PE (pulmonary thromboembolism) (HCC) 06/2019   Pre-syncope 04/16/2019   Sarcoidosis    Sarcoidosis    Sleep apnea    c pap   Smoker 02/12/2014    Patient Active Problem List   Diagnosis Date Noted   Primary osteoarthritis of left knee 10/27/2020   Grief at loss of child 06/09/2020   Insomnia 06/09/2020   Right ankle swelling 02/05/2020   Encounter for examination following treatment at hospital 01/28/2020   Palpitations 01/28/2020   Menorrhagia with regular cycle 10/15/2019    OSA (obstructive sleep apnea) 10/08/2019   Right knee pain 08/29/2019   Positive ANA (antinuclear antibody) 08/29/2019   Essential hypertension 07/04/2019   Pain and swelling of right knee 07/04/2019   Hyperlipidemia 07/04/2019   Stage 2 chronic kidney disease 07/04/2019   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   Encounter for support and coordination of transition of care 07/04/2019   Daytime hypersomnolence 07/02/2019   Pulmonary hypertension (HCC) 07/02/2019   CKD (chronic kidney disease)    Bilateral pulmonary embolism (HCC) 06/18/2019   Lightheadedness 05/10/2019   Vitamin D deficiency 05/10/2019   Class 2 obesity due to excess calories with body mass index (BMI) of 37.0 to 37.9 in adult 04/16/2019   Sarcoidosis 05/04/2015   Bronchiectasis (HCC) 05/04/2015    Past Surgical History:  Procedure Laterality Date   CHOLECYSTECTOMY N/A 08/25/2016   Procedure: LAPAROSCOPIC CHOLECYSTECTOMY;  Surgeon: Abigail Miyamoto, MD;  Location: [facility] OR;  Service: General;  Laterality: N/A;   TUBAL LIGATION  02/1993    Allergies Patient has no known allergies.  Family History  Problem Relation Age of Onset   Hypertension Mother    Cancer Mother    Colon polyps Mother    Healthy Sister    Healthy Sister    Healthy Sister    Healthy Sister    Healthy Son    Healthy Son    Healthy Daughter    Colon cancer Neg Hx    Esophageal cancer Neg Hx    Rectal cancer Neg Hx  Stomach cancer Neg Hx     Social History Social History   Tobacco Use   Smoking status: Former    Packs/day: 1.00    Years: 20.00    Pack years: 20.00    Types: Cigarettes    Quit date: 03/21/2015    Years since quitting: 5.7   Smokeless tobacco: Never  Vaping Use   Vaping Use: Never used  Substance Use Topics   Alcohol use: Yes    Alcohol/week: 0.0 standard drinks    Comment: Rare   Drug use: No    Review of Systems  Constitutional: No fever/chills Eyes: No visual changes. ENT: No sore  throat. Cardiovascular: Denies chest pain. Positive sensation through the chest intermittently. Positive lightheadedness.  Respiratory: Denies shortness of breath. Gastrointestinal: No abdominal pain.  No nausea, no vomiting.  No diarrhea.  No constipation. Genitourinary: Negative for dysuria. Musculoskeletal: Negative for back pain. Skin: Negative for rash. Neurological: Negative for headaches, focal weakness or numbness.  10-point ROS otherwise negative.  ____________________________________________   PHYSICAL EXAM:  VITAL SIGNS: ED Triage Vitals  Enc Vitals Group     BP 12/23/20 0900 135/76     Pulse Rate 12/23/20 0900 (!) 53     Resp --      Temp 12/23/20 0858 97.6 F (36.4 C)     Temp Source 12/23/20 0858 Oral     SpO2 12/23/20 0900 99 %     Weight 12/23/20 0859 209 lb 7 oz (95 kg)     Height 12/23/20 0859 5\' 5"  (1.651 m)    Constitutional: Alert and oriented. Well appearing and in no acute distress. Eyes: Conjunctivae are normal.  Head: Atraumatic. Nose: No congestion/rhinnorhea. Mouth/Throat: Mucous membranes are moist.  Neck: No stridor.  Cardiovascular: Normal rate, regular rhythm. Good peripheral circulation. Grossly normal heart sounds.   Respiratory: Normal respiratory effort.  No retractions. Lungs CTAB. Gastrointestinal: Soft and nontender. No distention.  Musculoskeletal: No lower extremity tenderness nor edema. No gross deformities of extremities. Neurologic:  Normal speech and language. No gross focal neurologic deficits are appreciated.  Skin:  Skin is warm, dry and intact. No rash noted.   ____________________________________________   LABS (all labs ordered are listed, but only abnormal results are displayed)  Labs Reviewed  COMPREHENSIVE METABOLIC PANEL - Abnormal; Notable for the following components:      Result Value   Creatinine, Ser 1.11 (*)    Calcium 8.7 (*)    All other components within normal limits  CBC WITH DIFFERENTIAL/PLATELET   TROPONIN I (HIGH SENSITIVITY)   ____________________________________________  EKG   EKG Interpretation  Date/Time:  Wednesday December 23 2020 09:46:07 EDT Ventricular Rate:  54 PR Interval:  154 QRS Duration: 101 QT Interval:  444 QTC Calculation: 421 R Axis:   26 Text Interpretation: Sinus rhythm RSR' in V1 or V2, right VCD or RVH Similar to prior tracing Confirmed by 12-03-1976 [phone]) on 12/23/2020 9:57:25 AM        ____________________________________________  RADIOLOGY  DG Chest 2 View  Result Date: 12/26/2020 CLINICAL DATA:  History of sarcoidosis with nonproductive cough. EXAM: CHEST - 2 VIEW COMPARISON:  December 18, 2020 FINDINGS: Chronic appearing increased lung markings are seen with ill-defined areas of scarring, atelectasis and fibrosis are seen throughout both lungs. This is stable in appearance when compared to the prior study. There is no evidence of a pleural effusion or pneumothorax. The heart size and mediastinal contours are within normal limits. The visualized skeletal structures are unremarkable.  IMPRESSION: Stable exam without acute cardiopulmonary disease. Electronically Signed   By: Aram Candela M.D.   On: 12/26/2020 19:25    ____________________________________________   PROCEDURES  Procedure(s) performed:   Procedures  None ____________________________________________   INITIAL IMPRESSION / ASSESSMENT AND PLAN / ED COURSE  Pertinent labs & imaging results that were available during my care of the patient were reviewed by me and considered in my medical decision making (see chart for details).   Patient presents emergency department with atypical chest discomfort and lightheadedness.  She is feeling presyncopal but no full syncope events.  Labs here show mild bradycardia but normal blood pressures.  Patient is strictly compliant with her anticoagulation.  While my overall suspicion for PE is low, patient does provide history that this  is how she felt prior to being diagnosed with her PE initially.  D-dimer likely of little utility given her history of lupus as well as prior PE.  Plan for CTA of the chest for further evaluation along with troponins, labs, IV fluid.   CTA negative for PE. Labs reviewed and discussed with patient along with follow up plan.    I reviewed all nursing notes, vitals, pertinent old records, EKGs, labs, imaging (as available).  ____________________________________________  FINAL CLINICAL IMPRESSION(S) / ED DIAGNOSES  Final diagnoses:  Lightheadedness  Atypical chest pain     MEDICATIONS GIVEN DURING THIS VISIT:  Medications  sodium chloride 0.9 % bolus 500 mL (0 mLs Intravenous Stopped 12/23/20 1212)  iohexol (OMNIPAQUE) 350 MG/ML injection 100 mL (80 mLs Intravenous Contrast Given 12/23/20 1109)     Note:  This document was prepared using Dragon voice recognition software and may include unintentional dictation errors.  Alona Bene, MD, [facility] Emergency Medicine    Elliott Lasecki, Arlyss Repress, MD 12/27/20 1700

## 2020-12-23 NOTE — Discharge Instructions (Signed)
As we discussed, we believe her symptoms are caused today by mild dehydration, without any evidence of damage to your body.  Please drink plenty of clear fluids such as water and/or Gatorade and follow up with your regular doctor or the doctors listed in his documentation at the next available opportunity.  Return to the emergency department with any new or worsening symptoms that concern you, including but not limited to fever, shortness of breath, chest pain, or other concerning symptoms.

## 2020-12-23 NOTE — Progress Notes (Signed)
-  NO CHARGE  PATIENT WENT TO ED

## 2020-12-23 NOTE — ED Triage Notes (Signed)
Pt from home c/o of a wired sensation from her abdomen to her upper chest that comes and goes. Pt c/o of feeling light headed. All the symptoms come and go and says it has been happening for the past year but is now more consistent. Pt was seen at [facility] x5 days ago for the same. Positive for covid on 12/11/20. Pt has hx of blood clots and wants to be checked out. Denies all pain at this time.

## 2020-12-24 ENCOUNTER — Telehealth: Payer: Self-pay | Admitting: *Deleted

## 2020-12-24 NOTE — Telephone Encounter (Signed)
Mychart message from 12/23/20 at 6:23 pm  I've been in the ED twice this month for dizziness and  for a "bubbling" feeling in my entire torso from waist up to chest and into my back. I've noticed fluctuations in my oxygen levels (86-91%) lowest and average of 93%. Along with that, my pulse and BP has fluctuated. I'm concerned that something is going on and don't want it to get missed. Could this be related to sarcoidosis. After walking my sats drop to 88%.    Primary Pulmonologist: Sood Last office visit and with whom: 09/03/20  Rubye Oaks NP What do we see them for (pulmonary problems): Sarcoidosis of the lung Last OV assessment/plan:   Assessment & Plan Note by Julio Sicks, NP at 09/03/2020 4:09 PM  Author: Julio Sicks, NP Author Type: Nurse Practitioner Filed: 09/03/2020  4:09 PM  Note Status: Written Cosign: Cosign Not Required Encounter Date: 09/03/2020  Problem: Sarcoidosis of lung [facility])  Editor: Julio Sicks, NP (Nurse Practitioner)               Appears stable.  Holdn     Plan  Patient Instructions  Continue on Xarelto 20mg  daily.  Avoid NSAID meds , (advil, motrin, etc)  Continue on Flutter valve for cough/congestion .  Advance activity as tolerated.  Try to wear CPAP each night .  Saline nasal spray and gel As needed   Wear for at least 4-6 hrs or more each night .  Do not drive if sleepy .  Work on healthy weight loss .  Continue follow up with Rheumatology .  Follow up in 4 months with Dr. or Parrett NP and As needed                Patient Instructions by Craige Cotta, NP at 09/03/2020 3:30 PM  Author: 09/05/2020, NP Author Type: Nurse Practitioner Filed: 09/03/2020  4:04 PM  Note Status: Addendum Cosign: Cosign Not Required Encounter Date: 09/03/2020  Editor: 09/05/2020, NP (Nurse Practitioner)      Prior Versions: 1. Parrett, Julio Sicks, NP (Nurse Practitioner) at 09/03/2020  3:55 PM - Signed    Continue on Xarelto 20mg  daily.   Avoid NSAID meds , (advil, motrin, etc)  Continue on Flutter valve for cough/congestion .  Advance activity as tolerated.  Try to wear CPAP each night .  Saline nasal spray and gel As needed   Wear for at least 4-6 hrs or more each night .  Do not drive if sleepy .  Work on healthy weight loss .  Continue follow up with Rheumatology .  Follow up in 4 months with Dr. 09/05/2020 or Parrett NP and As needed           Orthostatic Vitals Recorded in This Encounter   09/03/2020  1536     Patient Position: Sitting  BP Location: Left Arm  Cuff Size: Normal   Instructions    Return in about 4 months (around 01/04/2021) for Follow up Dr. 09/05/2020.  Continue on Xarelto 20mg  daily.  Avoid NSAID meds , (advil, motrin, etc)  Continue on Flutter valve for cough/congestion .  Advance activity as tolerated.  Try to wear CPAP each night .  Saline nasal spray and gel As needed   Wear for at least 4-6 hrs or more each night .  Do not drive if sleepy .  Work on healthy weight loss .  Continue follow up with Rheumatology .  Follow up in 4 months with Dr. Craige Cotta or Parrett NP and As needed         Was appointment offered to patient (explain)?  Scheduled to see Rubye Oaks NP on 12/29/20, she was scheduled to see Dr. Craige Cotta on 01/04/21, but he had no earlier openings.   Reason for call: Oxygen levels drop when she exerts herself.  Has heaviness in legs.  Recovers quickly after her sats drop to 86% after she sits down.  She never feels sob (she describes sob as having to gasp or panting), this has not happened to her.  Has used the albuterol inhaler twice yesterday and once today, she does not get relief from it.  She was told she was dehydrated, she drinks water all day long.  She was given fluids in the ED yesterday and had a CT scan that was negative.  Please advise if patient needs to do anything specific prior to her f/u with you on 12/29/20.  Thank you.  (examples of things to ask: : When did symptoms  start? Fever? Cough? Productive? Color to sputum? More sputum than usual? Wheezing? Have you needed increased oxygen? Are you taking your respiratory medications? What over the counter measures have you tried?)  No Known Allergies  Immunization History  Administered Date(s) Administered   Influenza Inj Mdck Quad Pf 12/25/2019   Influenza,inj,Quad PF,6+ Mos 12/21/2018   Influenza,inj,Quad PF,6-35 Mos 01/10/2019   PFIZER(Purple Top)SARS-COV-2 Vaccination 08/14/2019, 09/10/2019, 02/07/2020   Pneumococcal Polysaccharide-23 02/12/2014   Tdap 02/12/2014

## 2020-12-24 NOTE — Telephone Encounter (Signed)
Called and spoke with Patient.  Tammy,NP recommendations given.  Understanding stated. Nothing further at this time. 

## 2020-12-24 NOTE — Telephone Encounter (Signed)
Will need evaluation  Keep ov on 9/20  Please contact office for sooner follow up if symptoms do not improve or worsen or seek emergency care  '

## 2020-12-26 ENCOUNTER — Emergency Department (HOSPITAL_BASED_OUTPATIENT_CLINIC_OR_DEPARTMENT_OTHER)
Admission: EM | Admit: 2020-12-26 | Discharge: 2020-12-27 | Disposition: A | Discharge status: Home / Self Care | Payer: 59 | Attending: Emergency Medicine | Admitting: Emergency Medicine

## 2020-12-26 ENCOUNTER — Emergency Department (HOSPITAL_BASED_OUTPATIENT_CLINIC_OR_DEPARTMENT_OTHER): Payer: 59 | Admitting: Radiology

## 2020-12-26 ENCOUNTER — Other Ambulatory Visit: Payer: Self-pay

## 2020-12-26 ENCOUNTER — Other Ambulatory Visit: Payer: Self-pay | Admitting: Physician Assistant

## 2020-12-26 ENCOUNTER — Encounter (HOSPITAL_BASED_OUTPATIENT_CLINIC_OR_DEPARTMENT_OTHER): Payer: Self-pay

## 2020-12-26 DIAGNOSIS — R1013 Epigastric pain: Secondary | ICD-10-CM | POA: Diagnosis not present

## 2020-12-26 DIAGNOSIS — N182 Chronic kidney disease, stage 2 (mild): Secondary | ICD-10-CM | POA: Insufficient documentation

## 2020-12-26 DIAGNOSIS — I129 Hypertensive chronic kidney disease with stage 1 through stage 4 chronic kidney disease, or unspecified chronic kidney disease: Secondary | ICD-10-CM | POA: Insufficient documentation

## 2020-12-26 DIAGNOSIS — Z7901 Long term (current) use of anticoagulants: Secondary | ICD-10-CM | POA: Diagnosis not present

## 2020-12-26 DIAGNOSIS — Z87891 Personal history of nicotine dependence: Secondary | ICD-10-CM | POA: Diagnosis not present

## 2020-12-26 DIAGNOSIS — R002 Palpitations: Secondary | ICD-10-CM | POA: Diagnosis not present

## 2020-12-26 DIAGNOSIS — R768 Other specified abnormal immunological findings in serum: Secondary | ICD-10-CM

## 2020-12-26 DIAGNOSIS — Z79899 Other long term (current) drug therapy: Secondary | ICD-10-CM | POA: Diagnosis not present

## 2020-12-26 LAB — BASIC METABOLIC PANEL
Anion gap: 9 (ref 5–15)
BUN: 12 mg/dL (ref 6–20)
CO2: 25 mmol/L (ref 22–32)
Calcium: 9.5 mg/dL (ref 8.9–10.3)
Chloride: 105 mmol/L (ref 98–111)
Creatinine, Ser: 1.16 mg/dL — ABNORMAL HIGH (ref 0.44–1.00)
GFR, Estimated: 57 mL/min — ABNORMAL LOW (ref >60)
Glucose, Bld: 78 mg/dL (ref 70–99)
Potassium: 3.6 mmol/L (ref 3.5–5.1)
Sodium: 139 mmol/L (ref 135–145)

## 2020-12-26 LAB — CBC
HCT: 40 % (ref 36.0–46.0)
Hemoglobin: 12.6 g/dL (ref 12.0–15.0)
MCH: 27 pg (ref 26.0–34.0)
MCHC: 31.5 g/dL (ref 30.0–36.0)
MCV: 85.7 fL (ref 80.0–100.0)
Platelets: 305 10*3/uL (ref 150–400)
RBC: 4.67 MIL/uL (ref 3.87–5.11)
RDW: 13.9 % (ref 11.5–15.5)
WBC: 10.9 10*3/uL — ABNORMAL HIGH (ref 4.0–10.5)
nRBC: 0 % (ref 0.0–0.2)

## 2020-12-26 LAB — DIFFERENTIAL
Abs Immature Granulocytes: 0.04 10*3/uL (ref 0.00–0.07)
Basophils Absolute: 0 10*3/uL (ref 0.0–0.1)
Basophils Relative: 0 %
Eosinophils Absolute: 0.1 10*3/uL (ref 0.0–0.5)
Eosinophils Relative: 1 %
Immature Granulocytes: 0 %
Lymphocytes Relative: 35 %
Lymphs Abs: 3.9 10*3/uL (ref 0.7–4.0)
Monocytes Absolute: 0.7 10*3/uL (ref 0.1–1.0)
Monocytes Relative: 7 %
Neutro Abs: 6.2 10*3/uL (ref 1.7–7.7)
Neutrophils Relative %: 57 %

## 2020-12-26 LAB — TROPONIN I (HIGH SENSITIVITY)
Troponin I (High Sensitivity): 6 ng/L (ref <18)
Troponin I (High Sensitivity): 6 ng/L (ref <18)

## 2020-12-26 NOTE — ED Provider Notes (Signed)
[facility] EMERGENCY DEPT Provider Note   CSN: [id number] Arrival date & time: 12/26/20  1800     History Chief Complaint  Patient presents with   Palpitations    Hailey Ross is a 50 y.o. female.  HPI  50 year old female with past medical history of sarcoidosis, CKD, previous PE anticoagulated on Xarelto and compliant with medications presents emergency department with a bandlike sensation in her epigastric area.  Patient states that this started this morning, shortly after she woke up.  She states it feels like a rubber band in her epigastric region.  Sometimes when she stands overly tall she feels a heaviness down to her feet.  Denies any chest pain, heaviness, shortness of breath, dizziness.  Patient is never had this sensation before.  She otherwise been in her usual state of health with no recent fever.  She is had intermittent nausea but denies any vomiting or diarrhea.  Past Medical History:  Diagnosis Date   Acute cholecystitis 08/24/2016   Acute kidney injury superimposed on chronic kidney disease (HCC) 06/20/2019   Acute respiratory failure with hypoxia (HCC) 06/19/2019   Annual visit for general adult medical examination with abnormal findings 04/16/2019   Arthritis    right knee   Bronchiectasis (HCC)    CKD (chronic kidney disease)    Iron deficiency    Lightheadedness 05/10/2019   PE (pulmonary thromboembolism) (HCC) 06/2019   Pre-syncope 04/16/2019   Sarcoidosis    Sarcoidosis    Sleep apnea    c pap   Smoker 02/12/2014    Patient Active Problem List   Diagnosis Date Noted   Primary osteoarthritis of left knee 10/27/2020   Grief at loss of child 06/09/2020   Insomnia 06/09/2020   Right ankle swelling 02/05/2020   Encounter for examination following treatment at hospital 01/28/2020   Palpitations 01/28/2020   Menorrhagia with regular cycle 10/15/2019   OSA (obstructive sleep apnea) 10/08/2019   Right knee pain 08/29/2019   Positive ANA  (antinuclear antibody) 08/29/2019   Essential hypertension 07/04/2019   Pain and swelling of right knee 07/04/2019   Hyperlipidemia 07/04/2019   Stage 2 chronic kidney disease 07/04/2019   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   Encounter for support and coordination of transition of care 07/04/2019   Daytime hypersomnolence 07/02/2019   Pulmonary hypertension (HCC) 07/02/2019   CKD (chronic kidney disease)    Bilateral pulmonary embolism (HCC) 06/18/2019   Lightheadedness 05/10/2019   Vitamin D deficiency 05/10/2019   Class 2 obesity due to excess calories with body mass index (BMI) of 37.0 to 37.9 in adult 04/16/2019   Sarcoidosis 05/04/2015   Bronchiectasis (HCC) 05/04/2015    Past Surgical History:  Procedure Laterality Date   CHOLECYSTECTOMY N/A 08/25/2016   Procedure: LAPAROSCOPIC CHOLECYSTECTOMY;  Surgeon: Abigail Miyamoto, MD;  Location: [facility] OR;  Service: General;  Laterality: N/A;   TUBAL LIGATION  02/1993     OB History     Gravida  3   Para  3   Term  3   Preterm      AB      Living  3      SAB      IAB      Ectopic      Multiple      Live Births              Family History  Problem Relation Age of Onset   Hypertension Mother    Cancer Mother  Colon polyps Mother    Healthy Sister    Healthy Sister    Healthy Sister    Healthy Sister    Healthy Son    Healthy Son    Healthy Daughter    Colon cancer Neg Hx    Esophageal cancer Neg Hx    Rectal cancer Neg Hx    Stomach cancer Neg Hx     Social History   Tobacco Use   Smoking status: Former    Packs/day: 1.00    Years: 20.00    Pack years: 20.00    Types: Cigarettes    Quit date: 03/21/2015    Years since quitting: 5.7   Smokeless tobacco: Never  Vaping Use   Vaping Use: Never used  Substance Use Topics   Alcohol use: Yes    Alcohol/week: 0.0 standard drinks    Comment: Rare   Drug use: No    Home Medications Prior to Admission medications   Medication Sig Start  Date End Date Taking? Authorizing Provider  acetaminophen (TYLENOL) 500 MG tablet Take 500 mg by mouth every 4 (four) hours as needed for mild pain, moderate pain or headache.     [provider]  albuterol (PROVENTIL) (2.5 MG/3ML) 0.083% nebulizer solution Take 3 mLs (2.5 mg total) by nebulization every 6 (six) hours as needed for wheezing or shortness of breath. 07/22/19   Parrett, Virgel Bouquet, NP  albuterol (VENTOLIN HFA) 108 (90 Base) MCG/ACT inhaler INHALE 2 PUFFS INTO THE LUNGS EVERY 6 HOURS AS NEEDED FOR WHEEZING OR SHORTNESS OF BREATH Patient taking differently: Inhale 1-2 puffs into the lungs every 6 (six) hours as needed for wheezing or shortness of breath. 07/09/20   Coralyn Helling, MD  diltiazem (CARDIZEM) 30 MG tablet TAKE 1 TABLET(30 MG) BY MOUTH TWICE DAILY Patient taking differently: Take 30 mg by mouth 2 (two) times daily. 08/21/20   Antoine Poche, MD  lisinopril (ZESTRIL) 2.5 MG tablet Take 2.5 mg by mouth daily. 12/21/20   [provider]  pantoprazole (PROTONIX) 40 MG tablet Take 1 tablet (40 mg total) by mouth daily. 11/12/19   Hilarie Fredrickson, MD  rosuvastatin (CRESTOR) 5 MG tablet Take 1 tablet (5 mg total) by mouth daily. 12/22/20   Heather Roberts, NP  traZODone (DESYREL) 50 MG tablet Take 50-100 mg by mouth at bedtime. 11/05/20   [provider]  Vitamin D, Ergocalciferol, (DRISDOL) 1.25 MG (50000 UNIT) CAPS capsule Take 1 capsule (50,000 Units total) by mouth every 7 (seven) days. 12/22/20   Heather Roberts, NP  XARELTO 20 MG TABS tablet TAKE 1 TABLET(20 MG) BY MOUTH DAILY WITH SUPPER Patient taking differently: Take 20 mg by mouth daily with supper. 08/27/20   Coralyn Helling, MD    Allergies    Patient has no known allergies.  Review of Systems   Review of Systems  Constitutional:  Positive for fatigue. Negative for chills and fever.  HENT:  Negative for congestion.   Eyes:  Negative for visual disturbance.  Respiratory:  Negative for chest tightness and  shortness of breath.   Cardiovascular:  Negative for chest pain, palpitations and leg swelling.  Gastrointestinal:  Positive for nausea. Negative for abdominal pain, diarrhea and vomiting.  Genitourinary:  Negative for dysuria.  Skin:  Negative for rash.  Neurological:  Negative for headaches.   Physical Exam Updated Vital Signs BP 135/90   Pulse 60   Temp 97.6 F (36.4 C) (Oral)   Resp 19   Ht  5\' 5"  (1.651 m)   Wt 95.7 kg   LMP 06/17/2020 (Approximate)   SpO2 95%   BMI 35.11 kg/m   Physical Exam Vitals and nursing note reviewed.  Constitutional:      General: She is not in acute distress.    Appearance: Normal appearance.  HENT:     Head: Normocephalic.     Mouth/Throat:     Mouth: Mucous membranes are moist.  Cardiovascular:     Rate and Rhythm: Normal rate.  Pulmonary:     Effort: Pulmonary effort is normal. No respiratory distress.  Abdominal:     Palpations: Abdomen is soft.     Tenderness: There is no abdominal tenderness.  Musculoskeletal:        General: No swelling.  Skin:    General: Skin is warm.  Neurological:     Mental Status: She is alert and oriented to person, place, and time. Mental status is at baseline.  Psychiatric:        Mood and Affect: Mood normal.    ED Results / Procedures / Treatments   Labs (all labs ordered are listed, but only abnormal results are displayed) Labs Reviewed  BASIC METABOLIC PANEL - Abnormal; Notable for the following components:      Result Value   Creatinine, Ser 1.16 (*)    GFR, Estimated 57 (*)    All other components within normal limits  CBC - Abnormal; Notable for the following components:   WBC 10.9 (*)    All other components within normal limits  DIFFERENTIAL  TROPONIN I (HIGH SENSITIVITY)  TROPONIN I (HIGH SENSITIVITY)    EKG None  Radiology DG Chest 2 View  Result Date: 12/26/2020 CLINICAL DATA:  History of sarcoidosis with nonproductive cough. EXAM: CHEST - 2 VIEW COMPARISON:  December 18, 2020 FINDINGS: Chronic appearing increased lung markings are seen with ill-defined areas of scarring, atelectasis and fibrosis are seen throughout both lungs. This is stable in appearance when compared to the prior study. There is no evidence of a pleural effusion or pneumothorax. The heart size and mediastinal contours are within normal limits. The visualized skeletal structures are unremarkable. IMPRESSION: Stable exam without acute cardiopulmonary disease. Electronically Signed   By: 02-23[birth year]1985 M.D.   On: 12/26/2020 19:25    Procedures Procedures   Medications Ordered in ED Medications - No data to display  ED Course  I have reviewed the triage vital signs and the nursing notes.  Pertinent labs & imaging results that were available during my care of the patient were reviewed by me and considered in my medical decision making (see chart for details).    MDM Rules/Calculators/A&P                           50 year old female presents with a bandlike discomfort in her epigastric region.  No associated chest pain/back pain shortness of breath, episodes significantly improved but not resolved on arrival.  EKG shows sinus rhythm without any acute ischemic changes.  Blood work is reassuring, troponin is negative x2 with no delta, low suspicion for given that she is a type today compliant.  Pain pattern not consistent with dissection or other acute pathology.  Pain is resolved on reevaluation.  Plan for outpatient follow-up.  Patient at this time appears safe and stable for discharge and will be treated as an outpatient.  Discharge plan and strict return to ED precautions discussed, patient verbalizes understanding  and agreement.  Final Clinical Impression(s) / ED Diagnoses Final diagnoses:  None    Rx / DC Orders ED Discharge Orders     None        Rozelle Logan, DO 12/27/20 1554

## 2020-12-26 NOTE — Discharge Instructions (Addendum)
You have been seen and discharged from the emergency department.  Your cardiac work-up was normal.  Follow-up with your primary provider for reevaluation and further care. Take home medications as prescribed. If you have any worsening symptoms or further concerns for your health please return to an emergency department for further evaluation.

## 2020-12-26 NOTE — ED Triage Notes (Signed)
Pt arrive POV, ambulated to triage with steady gate. Pt c/o mid upper abd pain that feels like a "rubber band" along with dizziness and palpitations X4 days. EKG performed. +covid 12/11/2020. Pt seen earlier this week for "burning in chest", but since then started with the abd pain.

## 2020-12-28 ENCOUNTER — Other Ambulatory Visit: Payer: Self-pay

## 2020-12-28 ENCOUNTER — Ambulatory Visit (INDEPENDENT_AMBULATORY_CARE_PROVIDER_SITE_OTHER): Payer: 59 | Admitting: Nurse Practitioner

## 2020-12-28 ENCOUNTER — Encounter: Payer: Self-pay | Admitting: Nurse Practitioner

## 2020-12-28 VITALS — BP 120/82 | Ht 65.0 in | Wt 213.1 lb

## 2020-12-28 DIAGNOSIS — R1010 Upper abdominal pain, unspecified: Secondary | ICD-10-CM | POA: Diagnosis not present

## 2020-12-28 DIAGNOSIS — R1013 Epigastric pain: Secondary | ICD-10-CM | POA: Insufficient documentation

## 2020-12-28 MED ORDER — PREDNISONE 20 MG PO TABS
40.0000 mg | ORAL_TABLET | Freq: Every day | ORAL | 0 refills | Status: DC
Start: 2020-12-28 — End: 2021-01-22

## 2020-12-28 NOTE — Progress Notes (Signed)
Acute Office Visit  Subjective:    Patient ID: Hailey Ross, female    DOB: 07-07-1970, 50 y.o.   MRN: [id number]  Chief Complaint  Patient presents with   Follow-up    Er follow up cramps/pain top of stomach x2 weeks     HPI Patient is in today for ED follow-up. She has hx of sarcoidosis as well as other conditions listed below.  She states she feels like she has a band around her upper abdomen. She has abdominal cramping today. She states it is not associated with eating or with activity.  From the ED visit, "50 year old female presents with a bandlike discomfort in her epigastric region.  No associated chest pain/back pain shortness of breath, episodes significantly improved but not resolved on arrival.  EKG shows sinus rhythm without any acute ischemic changes.  Blood work is reassuring, troponin is negative x2 with no delta, low suspicion for given that she is a type today compliant.  Pain pattern not consistent with dissection or other acute pathology.  Pain is resolved on reevaluation.  Plan for outpatient follow-up.  Patient at this time appears safe and stable for discharge and will be treated as an outpatient.  Discharge plan and strict return to ED precautions discussed, patient verbalizes understanding and agreement."  Past Medical History:  Diagnosis Date   Acute cholecystitis 08/24/2016   Acute kidney injury superimposed on chronic kidney disease (HCC) 06/20/2019   Acute respiratory failure with hypoxia (HCC) 06/19/2019   Annual visit for general adult medical examination with abnormal findings 04/16/2019   Arthritis    right knee   Bronchiectasis (HCC)    CKD (chronic kidney disease)    Iron deficiency    Lightheadedness 05/10/2019   PE (pulmonary thromboembolism) (HCC) 06/2019   Pre-syncope 04/16/2019   Sarcoidosis    Sarcoidosis    Sleep apnea    c pap   Smoker 02/12/2014    Past Surgical History:  Procedure Laterality Date   CHOLECYSTECTOMY N/A 08/25/2016    Procedure: LAPAROSCOPIC CHOLECYSTECTOMY;  Surgeon: Abigail Miyamoto, MD;  Location: [facility] OR;  Service: General;  Laterality: N/A;   TUBAL LIGATION  02/1993    Family History  Problem Relation Age of Onset   Hypertension Mother    Cancer Mother    Colon polyps Mother    Healthy Sister    Healthy Sister    Healthy Sister    Healthy Sister    Healthy Son    Healthy Son    Healthy Daughter    Colon cancer Neg Hx    Esophageal cancer Neg Hx    Rectal cancer Neg Hx    Stomach cancer Neg Hx     Social History   Socioeconomic History   Marital status: Married    Spouse name: Not on file   Number of children: 3   Years of education: Not on file   Highest education level: High school graduate  Occupational History   Occupation: Administrator, [employer] ([employer])  Tobacco Use   Smoking status: Former    Packs/day: 1.00    Years: 20.00    Pack years: 20.00    Types: Cigarettes    Quit date: 03/21/2015    Years since quitting: 5.7   Smokeless tobacco: Never  Vaping Use   Vaping Use: Never used  Substance and Sexual Activity   Alcohol use: Yes    Alcohol/week: 0.0 standard drinks    Comment: Rare   Drug use: No  Sexual activity: Yes    Birth control/protection: Surgical  Other Topics Concern   Not on file  Social History Narrative   Live with partners [name]-19 years   3 children.    1-[name] (girl) 27 at home with her: 2 grandchildren in her home as well    [name]- 26 expecting   [name]-25      Enjoy: read, play games on tablet, grandkids      Diet: Veggies, does not eat a lot of fried foods, enjoys chicken and fruit   Caffeine: sodas and coffee (with sugar)   Water: Does not drink a lot at all      Wears seat beat   Does not wear sunscreen   Smoke and carbon monoxide detectors   Does not use phone while driving    Social Determinants of Corporate investment banker Strain: Not on file  Food Insecurity: Not on file  Transportation Needs: Not on file  Physical  Activity: Not on file  Stress: Not on file  Social Connections: Not on file  Intimate Partner Violence: Not on file    Outpatient Medications Prior to Visit  Medication Sig Dispense Refill   acetaminophen (TYLENOL) 500 MG tablet Take 500 mg by mouth every 4 (four) hours as needed for mild pain, moderate pain or headache.      albuterol (PROVENTIL) (2.5 MG/3ML) 0.083% nebulizer solution Take 3 mLs (2.5 mg total) by nebulization every 6 (six) hours as needed for wheezing or shortness of breath. 75 mL 1   albuterol (VENTOLIN HFA) 108 (90 Base) MCG/ACT inhaler INHALE 2 PUFFS INTO THE LUNGS EVERY 6 HOURS AS NEEDED FOR WHEEZING OR SHORTNESS OF BREATH (Patient taking differently: Inhale 1-2 puffs into the lungs every 6 (six) hours as needed for wheezing or shortness of breath.) 6.7 g 5   diltiazem (CARDIZEM) 30 MG tablet TAKE 1 TABLET(30 MG) BY MOUTH TWICE DAILY (Patient taking differently: Take 30 mg by mouth 2 (two) times daily.) 180 tablet 3   lisinopril (ZESTRIL) 2.5 MG tablet Take 2.5 mg by mouth daily.     pantoprazole (PROTONIX) 40 MG tablet Take 1 tablet (40 mg total) by mouth daily. 90 tablet 3   rosuvastatin (CRESTOR) 5 MG tablet Take 1 tablet (5 mg total) by mouth daily. 90 tablet 1   traZODone (DESYREL) 50 MG tablet Take 50-100 mg by mouth at bedtime.     Vitamin D, Ergocalciferol, (DRISDOL) 1.25 MG (50000 UNIT) CAPS capsule Take 1 capsule (50,000 Units total) by mouth every 7 (seven) days. 12 capsule 1   XARELTO 20 MG TABS tablet TAKE 1 TABLET(20 MG) BY MOUTH DAILY WITH SUPPER (Patient taking differently: Take 20 mg by mouth daily with supper.) 90 tablet 2   No facility-administered medications prior to visit.    No Known Allergies  Review of Systems  Constitutional: Negative.   Respiratory: Negative.    Cardiovascular: Negative.   Gastrointestinal:  Positive for abdominal pain. Negative for blood in stool, constipation, diarrhea, nausea and vomiting.  Psychiatric/Behavioral:  Negative.        Objective:    Physical Exam Constitutional:      Appearance: Normal appearance.  Cardiovascular:     Rate and Rhythm: Normal rate and regular rhythm.     Pulses: Normal pulses.     Heart sounds: Normal heart sounds.  Pulmonary:     Effort: Pulmonary effort is normal.     Breath sounds: Normal breath sounds.  Abdominal:     General:  Abdomen is flat. Bowel sounds are normal.     Palpations: Abdomen is soft.  Neurological:     Mental Status: She is alert.  Psychiatric:        Mood and Affect: Mood normal.        Behavior: Behavior normal.        Thought Content: Thought content normal.        Judgment: Judgment normal.    BP 120/82 (BP Location: Left Arm, Patient Position: Sitting, Cuff Size: Normal)   Ht 5\' 5"  (1.651 m)   Wt 213 lb 1.3 oz (96.7 kg)   BMI 35.46 kg/m  Wt Readings from Last 3 Encounters:  12/28/20 213 lb 1.3 oz (96.7 kg)  12/26/20 211 lb (95.7 kg)  12/23/20 209 lb 7 oz (95 kg)    Health Maintenance Due  Topic Date Due   Hepatitis C Screening  Never done   Zoster Vaccines- Shingrix (1 of 2) Never done   MAMMOGRAM  Never done   COVID-19 Vaccine (4 - Booster for Pfizer series) 05/01/2020    There are no preventive care reminders to display for this patient.   Lab Results  Component Value Date   TSH 1.71 04/22/2019   Lab Results  Component Value Date   WBC 10.9 (H) 12/26/2020   HGB 12.6 12/26/2020   HCT 40.0 12/26/2020   MCV 85.7 12/26/2020   PLT 305 12/26/2020   Lab Results  Component Value Date   NA 139 12/26/2020   K 3.6 12/26/2020   CO2 25 12/26/2020   GLUCOSE 78 12/26/2020   BUN 12 12/26/2020   CREATININE 1.16 (H) 12/26/2020   BILITOT 0.6 12/23/2020   ALKPHOS 66 12/23/2020   AST 21 12/23/2020   ALT 18 12/23/2020   PROT 6.8 12/23/2020   ALBUMIN 3.6 12/23/2020   CALCIUM 9.5 12/26/2020   ANIONGAP 9 12/26/2020   GFR 54.82 (L) 02/01/2016   Lab Results  Component Value Date   CHOL 230 (H) 04/22/2019   Lab  Results  Component Value Date   HDL 58 04/22/2019   Lab Results  Component Value Date   LDLCALC 152 (H) 04/22/2019   Lab Results  Component Value Date   TRIG 93 04/22/2019   Lab Results  Component Value Date   CHOLHDL 4.0 04/22/2019   Lab Results  Component Value Date   HGBA1C 5.7 (H) 04/22/2019       Assessment & Plan:   Problem List Items Addressed This Visit       Other   Upper abdominal pain - Primary    -negative cardiac workup at ED -no abdominal imaging was completed -symptoms don't fit with GERD/PUD -? Sarcoidosis -Rx. Prednisone burst pack; if symptoms improve may continue this -WBC was slightly elevated, recheck today prior to starting steroids      Relevant Medications   predniSONE (DELTASONE) 20 MG tablet   Other Relevant Orders   Ambulatory referral to Gastroenterology   CBC with Differential/Platelet     Meds ordered this encounter  Medications   predniSONE (DELTASONE) 20 MG tablet    Sig: Take 2 tablets (40 mg total) by mouth daily with breakfast.    Dispense:  10 tablet    Refill:  0     06/20/2019, NP

## 2020-12-28 NOTE — Patient Instructions (Signed)
Please have blood count drawn today.

## 2020-12-28 NOTE — Assessment & Plan Note (Addendum)
-  negative cardiac workup at ED -no abdominal imaging was completed -symptoms don't fit with GERD/PUD -? Sarcoidosis -Rx. Prednisone burst pack; if symptoms improve may continue this -WBC was slightly elevated, recheck today prior to starting steroids

## 2020-12-29 ENCOUNTER — Encounter: Payer: Self-pay | Admitting: Adult Health

## 2020-12-29 ENCOUNTER — Ambulatory Visit (INDEPENDENT_AMBULATORY_CARE_PROVIDER_SITE_OTHER): Payer: 59 | Admitting: Adult Health

## 2020-12-29 DIAGNOSIS — D860 Sarcoidosis of lung: Secondary | ICD-10-CM

## 2020-12-29 DIAGNOSIS — J479 Bronchiectasis, uncomplicated: Secondary | ICD-10-CM

## 2020-12-29 DIAGNOSIS — G4733 Obstructive sleep apnea (adult) (pediatric): Secondary | ICD-10-CM

## 2020-12-29 DIAGNOSIS — I2699 Other pulmonary embolism without acute cor pulmonale: Secondary | ICD-10-CM

## 2020-12-29 DIAGNOSIS — K219 Gastro-esophageal reflux disease without esophagitis: Secondary | ICD-10-CM | POA: Diagnosis not present

## 2020-12-29 DIAGNOSIS — R109 Unspecified abdominal pain: Secondary | ICD-10-CM | POA: Diagnosis not present

## 2020-12-29 LAB — CBC WITH DIFFERENTIAL/PLATELET
Basophils Absolute: 0 10*3/uL (ref 0.0–0.2)
Basos: 0 %
EOS (ABSOLUTE): 0.1 10*3/uL (ref 0.0–0.4)
Eos: 1 %
Hematocrit: 38.5 % (ref 34.0–46.6)
Hemoglobin: 12.7 g/dL (ref 11.1–15.9)
Immature Grans (Abs): 0 10*3/uL (ref 0.0–0.1)
Immature Granulocytes: 0 %
Lymphocytes Absolute: 4 10*3/uL — ABNORMAL HIGH (ref 0.7–3.1)
Lymphs: 36 %
MCH: 27.1 pg (ref 26.6–33.0)
MCHC: 33 g/dL (ref 31.5–35.7)
MCV: 82 fL (ref 79–97)
Monocytes Absolute: 0.7 10*3/uL (ref 0.1–0.9)
Monocytes: 7 %
Neutrophils Absolute: 6.3 10*3/uL (ref 1.4–7.0)
Neutrophils: 56 %
Platelets: 328 10*3/uL (ref 150–450)
RBC: 4.68 x10E6/uL (ref 3.77–5.28)
RDW: 13.1 % (ref 11.7–15.4)
WBC: 11.2 10*3/uL — ABNORMAL HIGH (ref 3.4–10.8)

## 2020-12-29 MED ORDER — PANTOPRAZOLE SODIUM 40 MG PO TBEC: 40.0000 mg | DELAYED_RELEASE_TABLET | Freq: Every day | ORAL | 3 refills | Status: DC

## 2020-12-29 NOTE — Progress Notes (Signed)
Reviewed and agree with assessment/plan.   Tashica Provencio, MD [facility] 12/29/2020, 1:08 PM Pager:  [phone]  

## 2020-12-29 NOTE — Progress Notes (Signed)
Labs are stable since her previous draw, so doubtful that her symptoms are caused by infection. If she gets worse or symptoms change, she should call us.

## 2020-12-29 NOTE — Patient Instructions (Addendum)
Restart Protonix 40mg  daily .  GERD diet  If stomach issues not improving follow up with PCP or GI.  Continue on Xarelto 20mg  daily.  Avoid NSAID meds , (advil, motrin, etc)  Continue on Flutter valve for cough/congestion .  Advance activity as tolerated.  Try to wear CPAP each night .  Saline nasal spray and gel As needed   Wear for at least 4-6 hrs or more each night .  Do not drive if sleepy .  Work on healthy weight loss .  Flu shot next month as planned .  Continue follow up with Rheumatology .  Follow up in 3 months with Dr. or Rashied Corallo NP and As needed

## 2020-12-29 NOTE — Assessment & Plan Note (Signed)
Patient is encouraged on CPAP compliance.  Patient education given.  Continue on CPAP at bedtime.

## 2020-12-29 NOTE — Assessment & Plan Note (Signed)
Epigastric discomfort suspect could have been underlying GERD.  ER work-up was unrevealing.  Patient has been off her Protonix for a while.  I recommended she restart with Protonix.  GERD diet.  If symptoms persist will need to follow-up with her primary care or GI.

## 2020-12-29 NOTE — Assessment & Plan Note (Signed)
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  Continue on Xarelto.  Patient education given.

## 2020-12-29 NOTE — Assessment & Plan Note (Signed)
Appears stable without exacerbation.  Chest x-ray and CT chest showed stable chronic changes.  Continue on current regimen.

## 2020-12-29 NOTE — Assessment & Plan Note (Signed)
Currently under control without flare.  Continue with flutter valve.  Mucociliary clearance.

## 2020-12-29 NOTE — Progress Notes (Signed)
@Patient  ID: Hailey Ross, female    DOB: April 03, 1971, 50 y.o.   MRN: [id number]  Chief Complaint  Patient presents with   Acute Visit    Referring provider: Noreene Larsson, NP  HPI: 50 year old female never smoker followed for pulmonary sarcoidosis, bronchiectasis, sleep apnea.  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 Medical history significant for chronic kidney disease and hypertension  TEST/EVENTS :  Labs 04/21/15 >> HIV negative, Quantiferon gold negative, ACE 195, ANA, RF negative, ESR 32      Chest imaging:  CT chest 03/18/15 >> biapical thickening with traction BTX, BTX RML and lingula, subcarinal LAN 1.7 cm, patchy increased interstitial markings   HRCT chest 03/07/18 >> borderline LAN, patchy GGO, septal thickening, architectural distortion, cylindrical and varicose BTX   CT chest June 18, 2019 showed peripheral segmental and subsegmental bilateral pulmonary emboli, bulky mediastinal and hilar lymph nodes, bilateral apical and lingular honeycombing and bilateral upper lobe mild traction bronchiectasis.  Nonspecific groundglass opacities in the left base and right middle lobe.  Not significantly changed   Venous Dopplers were negative for DVT.  2D echo showed a preserved EF.  Mildly reduced right ventricular systolic function and moderately elevated pulmonary artery systolic pressure at 57 mmHg.  Moderate grade 3 protruding plaque involving the distal aortic arch and proximal descending aorta   Echo 09/2019    Home sleep study was done July 22, 2019.  This showed mild sleep apnea with an AHI at 9.5/hour and SPO2 low at 74%   PFT  PFT 05/26/15 >> FEV1 1.67 (66%), FEV1% 88, TLC 2/78 (53%), DLCO 36%  09/26/2019-pulmonary function test-FEV1 54%, ratio 91, FVC 47%, DLCO 45%  12/29/2020 Acute OV : Sarcoidosis Patient presents for a work in visit.  Patient has underlying sarcoidosis and bronchiectasis. Patient says she was having some  difficulty with upper epigastric pain and chest tightness.  Went to the emergency room on 12/26/2020.  Chest x-ray reviewed from the emergency room showed chronic sarcoid changes.  With areas of scarring, atelectasis and fibrosis bilaterally.  CT chest on 914 showed apical predominant fibrosis with honeycombing and traction bronchiectasis.  Chronic changes felt to be stable.  Troponin was negative.  Patient says she is starting to feel better. 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 She is no longer Imuran, joint pain some better.  Has been out of Protonix for a while .   Had mild URI symptoms at beginning of month, tested positive for COVID 19. Mild symptoms only . Molnupiravir rx . Did well , tolerated without any known issues. No increased cough or congestion .   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  Patient is endorses compliance.  Denies any known bleeding.  She does have obstructive sleep apnea is on nocturnal CPAP.  Says that she recently restarted on this.  Download over the last 4 days shows good compliance with average usage at 6 hours.  AHI 0.0.  Patient is on auto CPAP 5 to 15 cm H2O. Trying to get used to, determined to wear each night, wants to feel better.   Has been under a lot of stress.  Her daughter was killed in a home invasion and she has been raising her 2 grandkids.     No Known Allergies  Immunization History  Administered Date(s) Administered   Influenza Inj Mdck Quad Pf 12/25/2019   Influenza,inj,Quad PF,6+  Mos 12/21/2018   Influenza,inj,Quad PF,6-35 Mos 01/10/2019   PFIZER(Purple Top)SARS-COV-2 Vaccination 08/14/2019, 09/10/2019, 02/07/2020   Pneumococcal Polysaccharide-23 02/12/2014   Tdap 02/12/2014    Past Medical History:  Diagnosis Date   Acute cholecystitis 08/24/2016   Acute kidney injury superimposed on chronic kidney disease ([place]) 06/20/2019   Acute  respiratory failure with hypoxia ([place]) 06/19/2019   Annual visit for general adult medical examination with abnormal findings 04/16/2019   Arthritis    right knee   Bronchiectasis ([place])    CKD (chronic kidney disease)    Iron deficiency    Lightheadedness 05/10/2019   PE (pulmonary thromboembolism) ([place]) 06/2019   Pre-syncope 04/16/2019   Sarcoidosis    Sarcoidosis    Sleep apnea    c pap   Smoker 02/12/2014    Tobacco History: Social History   Tobacco Use  Smoking Status Former   Packs/day: 1.00   Years: 20.00   Pack years: 20.00   Types: Cigarettes   Quit date: 03/21/2015   Years since quitting: 5.7  Smokeless Tobacco Never   Counseling given: Not Answered   Outpatient Medications Prior to Visit  Medication Sig Dispense Refill   acetaminophen (TYLENOL) 500 MG tablet Take 500 mg by mouth every 4 (four) hours as needed for mild pain, moderate pain or headache.      albuterol (PROVENTIL) (2.5 MG/3ML) 0.083% nebulizer solution Take 3 mLs (2.5 mg total) by nebulization every 6 (six) hours as needed for wheezing or shortness of breath. 75 mL 1   albuterol (VENTOLIN HFA) 108 (90 Base) MCG/ACT inhaler INHALE 2 PUFFS INTO THE LUNGS EVERY 6 HOURS AS NEEDED FOR WHEEZING OR SHORTNESS OF BREATH (Patient taking differently: Inhale 1-2 puffs into the lungs every 6 (six) hours as needed for wheezing or shortness of breath.) 6.7 g 5   diltiazem (CARDIZEM) 30 MG tablet TAKE 1 TABLET(30 MG) BY MOUTH TWICE DAILY (Patient taking differently: Take 30 mg by mouth 2 (two) times daily.) 180 tablet 3   lisinopril (ZESTRIL) 2.5 MG tablet Take 2.5 mg by mouth daily.     predniSONE (DELTASONE) 20 MG tablet Take 2 tablets (40 mg total) by mouth daily with breakfast. 10 tablet 0   rosuvastatin (CRESTOR) 5 MG tablet Take 1 tablet (5 mg total) by mouth daily. 90 tablet 1   traZODone (DESYREL) 50 MG tablet Take 50-100 mg by mouth at bedtime.     Vitamin D, Ergocalciferol, (DRISDOL) 1.25 MG (50000 UNIT) CAPS  capsule Take 1 capsule (50,000 Units total) by mouth every 7 (seven) days. 12 capsule 1   XARELTO 20 MG TABS tablet TAKE 1 TABLET(20 MG) BY MOUTH DAILY WITH SUPPER (Patient taking differently: Take 20 mg by mouth daily with supper.) 90 tablet 2   pantoprazole (PROTONIX) 40 MG tablet Take 1 tablet (40 mg total) by mouth daily. 90 tablet 3   No facility-administered medications prior to visit.     Review of Systems:   Constitutional:   No  weight loss, night sweats,  Fevers, chills, fatigue, or  lassitude.  HEENT:   No headaches,  Difficulty swallowing,  Tooth/dental problems, or  Sore throat,                No sneezing, itching, ear ache, nasal congestion, post nasal drip,   CV:  No chest pain,  Orthopnea, PND, swelling in lower extremities, anasarca, dizziness, palpitations, syncope.   GI  No heartburn, indigestion, abdominal pain, nausea, vomiting, diarrhea, change in bowel habits, loss of appetite,  bloody stools.   Resp: No shortness of breath with exertion or at rest.  No excess mucus, no productive cough,  No non-productive cough,  No coughing up of blood.  No change in color of mucus.  No wheezing.  No chest wall deformity  Skin: no rash or lesions.  GU: no dysuria, change in color of urine, no urgency or frequency.  No flank pain, no hematuria   MS:  No joint pain or swelling.  No decreased range of motion.  No back pain.    Physical Exam  BP 130/80 (BP Location: Left Arm, Patient Position: Sitting, Cuff Size: Large)   Pulse 62   Temp 98.4 F (36.9 C) (Oral)   Ht 5' 5"  (1.651 m)   Wt 212 lb 9.6 oz (96.4 kg)   SpO2 94%   BMI 35.38 kg/m   GEN: A/Ox3; pleasant , NAD, well nourished    HEENT:  [place]/AT,   NOSE-clear, THROAT-clear, no lesions, no postnasal drip or exudate noted.   NECK:  Supple w/ fair ROM; no JVD; normal carotid impulses w/o bruits; no thyromegaly or nodules palpated; no lymphadenopathy.    RESP  Clear  P & A; w/o, wheezes/ rales/ or rhonchi. no  accessory muscle use, no dullness to percussion  CARD:  RRR, no m/r/g, no peripheral edema, pulses intact, no cyanosis or clubbing.  GI:   Soft & nt; nml bowel sounds; no organomegaly or masses detected.   Musco: Warm bil, no deformities or joint swelling noted.   Neuro: alert, no focal deficits noted.    Skin: Warm, no lesions or rashes    Lab Results:  CBC    Component Value Date/Time   WBC 11.2 (H) 12/28/2020 1126   WBC 10.9 (H) 12/26/2020 2220   RBC 4.68 12/28/2020 1126   RBC 4.67 12/26/2020 2220   HGB 12.7 12/28/2020 1126   HCT 38.5 12/28/2020 1126   PLT 328 12/28/2020 1126   MCV 82 12/28/2020 1126   MCH 27.1 12/28/2020 1126   MCH 27.0 12/26/2020 2220   MCHC 33.0 12/28/2020 1126   MCHC 31.5 12/26/2020 2220   RDW 13.1 12/28/2020 1126   LYMPHSABS 4.0 (H) 12/28/2020 1126   MONOABS 0.7 12/26/2020 2220   EOSABS 0.1 12/28/2020 1126   BASOSABS 0.0 12/28/2020 1126    BMET    Component Value Date/Time   NA 139 12/26/2020 2220   K 3.6 12/26/2020 2220   CL 105 12/26/2020 2220   CO2 25 12/26/2020 2220   GLUCOSE 78 12/26/2020 2220   BUN 12 12/26/2020 2220   CREATININE 1.16 (H) 12/26/2020 2220   CREATININE 1.19 (H) 09/23/2020 1458   CALCIUM 9.5 12/26/2020 2220   GFRNONAA 57 (L) 12/26/2020 2220   GFRNONAA 53 (L) 09/23/2020 1458   GFRAA 62 09/23/2020 1458    BNP No results found for: BNP  ProBNP No results found for: PROBNP  Imaging: DG Chest 2 View  Result Date: 12/26/2020 CLINICAL DATA:  History of sarcoidosis with nonproductive cough. EXAM: CHEST - 2 VIEW COMPARISON:  December 18, 2020 FINDINGS: Chronic appearing increased lung markings are seen with ill-defined areas of scarring, atelectasis and fibrosis are seen throughout both lungs. This is stable in appearance when compared to the prior study. There is no evidence of a pleural effusion or pneumothorax. The heart size and mediastinal contours are within normal limits. The visualized skeletal structures are  unremarkable. IMPRESSION: Stable exam without acute cardiopulmonary disease. Electronically Signed   By: Joyce Gross.D.  On: 12/26/2020 19:25   DG Chest 2 View  Result Date: 12/18/2020 CLINICAL DATA:  50 year old female with chest pain, chest tightness. Smoker. EXAM: CHEST - 2 VIEW COMPARISON:  Chest radiographs 01/22/2020 and earlier. FINDINGS: Chronic lung disease with areas of apical and other subpleural scarring and honeycombing demonstrated by CTA last year. Mediastinal contours are stable and within normal limits. Visualized tracheal air column is within normal limits. No pneumothorax, pulmonary edema, pleural effusion or acute pulmonary opacity. No acute osseous abnormality identified. Stable cholecystectomy clips. Negative visible bowel gas pattern. IMPRESSION: Chronic lung disease. No acute cardiopulmonary abnormality. Electronically Signed   By: Genevie Ann M.D.   On: 12/18/2020 09:28   CT Angio Chest PE W and/or Wo Contrast  Result Date: 12/23/2020 CLINICAL DATA:  Intermittent left-sided chest pain for 3 days EXAM: CT ANGIOGRAPHY CHEST WITH CONTRAST TECHNIQUE: Multidetector CT imaging of the chest was performed using the standard protocol during bolus administration of intravenous contrast. Multiplanar CT image reconstructions and MIPs were obtained to evaluate the vascular anatomy. CONTRAST:  28m OMNIPAQUE IOHEXOL 350 MG/ML SOLN COMPARISON:  07/15/2019 FINDINGS: Cardiovascular: Satisfactory opacification of the pulmonary arteries to the segmental level. No evidence of acute pulmonary embolism. Normal heart size. No pericardial effusion. Chronic dilatation of the main pulmonary artery to 3.5 cm diameter. Mediastinum/Nodes: History of sarcoid with stable mediastinal lymph nodes including asymmetric right hilar enlargement. Lungs/Pleura: Apical predominant fibrosis with honeycombing and traction bronchiectasis. There is no edema, consolidation, effusion, or pneumothorax. Upper Abdomen: Negative  Musculoskeletal: No acute finding Review of the MIP images confirms the above findings. IMPRESSION: 1. Stable from 2021.  No acute finding. 2. Pulmonary fibrosis in the setting of sarcoidosis. Electronically Signed   By: JJorje GuildM.D.   On: 12/23/2020 11:23      PFT Results Latest Ref Rng & Units 09/26/2019 05/26/2015  FVC-Pre L - 1.78  FVC-Predicted Pre % 47 57  FVC-Post L 1.39 1.88  FVC-Predicted Post % 45 60  Pre FEV1/FVC % % 91 85  Post FEV1/FCV % % 85 88  FEV1-Pre L 1.33 1.51  FEV1-Predicted Pre % 54 59  FEV1-Post L 1.18 1.67  DLCO uncorrected ml/min/mmHg 9.96 9.41  DLCO UNC% % 45 36  DLCO corrected ml/min/mmHg 10.18 9.38  DLCO COR %Predicted % 46 36  DLVA Predicted % 105 73  TLC L 3.17 2.78  TLC % Predicted % 61 53  RV % Predicted % 89 44    No results found for: NITRICOXIDE      Assessment & Plan:   Sarcoidosis of lung (HCC) Appears stable without exacerbation.  Chest x-ray and CT chest showed stable chronic changes.  Continue on current regimen.  OSA (obstructive sleep apnea) Patient is encouraged on CPAP compliance.  Patient education given.  Continue on CPAP at bedtime.  Bronchiectasis ([place] Currently under control without flare.  Continue with flutter valve.  Mucociliary clearance.  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  Continue on Xarelto.  Patient education given.  GERD (gastroesophageal reflux disease) Epigastric discomfort suspect could have been underlying GERD.  ER work-up was unrevealing.  Patient has been off her Protonix for a while.  I recommended she restart with Protonix.  GERD diet.  If symptoms persist will need to follow-up with her primary care or GI.     TRexene Edison NP 12/29/2020

## 2020-12-30 ENCOUNTER — Ambulatory Visit: Payer: 59 | Admitting: Nurse Practitioner

## 2020-12-30 NOTE — Progress Notes (Signed)
Cardiology Office Note  Date: 12/31/2020   ID: Hailey Ross, DOB January 16, 1971, MRN [id number]  PCP:  Heather Roberts, NP  Cardiologist:  Dina Rich, MD Electrophysiologist:  None   Chief Complaint:  ED follow up   History of Present Illness: Hailey Ross is a 50 y.o. female with a history of lightheadedness, IDA, sarcoidosis, sleep apnea, smoker, arthritis, CKD, respiratory failure with hypoxia, HTN, pulmonary hypertension, bilateral pulmonary embolism, sarcoidosis, palpitations, CKD stage II, pulmonary hypertension, history of PE, OSA on CPAP, HLD.  Last seen by Dr. Wyline Mood on 06/02/2020.  She did had some previous issues with palpitations with ER visit on 08/27/2019 with tachycardia and dizziness.  Episode of heart racing to 140s while driving lasting approximately 5 minutes.  Had a subsequent 30-day monitor in May 2021, no significant arrhythmias.  She was started on diltiazem 30 mg p.o. twice daily for symptoms.  Since starting diltiazem she had been doing well with no recent symptoms.  She had been followed by pulmonary for sarcoid and bronchiectasis with no significant shortness of breath.  Pulmonary hypertension had been noted on echo during PE.  Subsequent echo showed PASP had decreased.  She had mixed compliance with her CPAP.  She was compliant with her statin medications for hyperlipidemia.  Last LDL was 152.  She was last here for continued complaints of dizziness.  He stated this had been ongoing for over a year.  She recently had an MRI of the brain by neurology which was unremarkable. Previous event monitor in May 2021 showed no significant arrhythmias.  Echocardiogram June 2021 showed EF of 55 to 60%.  No WMA's.  Mildly elevated PASP of 35.3 mmHg.  LA and RA mildly dilated.,  Tricuspid regurgitation mild to moderate.  Previous carotid Doppler studies showed bilateral ICA stenosis 1 to 39% on 03/02/2020.  He stated the dizziness could occur with or without activity.   She stated she had 1 episode where she had significant coughing episode where she nearly passed out.  She has a long history of smoking. She had pulmonary function test on 09/26/2019 with pulmonary clinic.  Spirometry was suggestive of restrictive defect.  No significant bronchodilator response.  Mild restriction with lung volume testing.  Air trapping as demonstrated by RV: TLC ratio, moderate to severe diffusion defect.  She was last here for follow-up of recent echocardiogram and cardiac monitor. Cardiac monitor showed no significant sustained arrhythmias.  She had 11 supraventricular tachycardia runs with fastest interval lasting 9 beats with a maximum rate of 148.  The longest lasting was 12 beats with an average rate of 122.  Echocardiogram demonstrated EF of 55 to 60%.  No WMA's.  Trivial MR.  At last visit her lisinopril was stopped due to dizziness and low blood pressure.  Today's blood pressure is 112/68 with a heart rate of 56.  She continues to complain of intermittent lightheadedness with some numbness and paresthesias in both upper extremities.  She denies any orthostatic symptoms.  She continues to complain of ringing in her ears.  She states she would like to be referred to the [facility] neurology clinic for the symptoms.  She presented to [facility] ED on 12/18/20 for chest pain. A week prior she had tested positive for Covid and took antiviral. No severe symptoms. She had developed some "sensations" in her chest the prior day.  ED work up was within normal limits. Troponins negataive x 2 . CXR no acute process,   She presented to  [facility]23[facility]2020 with dizziness, lightheadedness, and "sensations" through epigastric area radiating to chest. Symptoms were intermittent. She stated she had similar symptoms on prior presentation with PE. She was compliant with her Xarelto. CXR stable without acute process. CT chest negative for PE. Troponin neg.   Presented to [facility] ED 12/26/2020 for palpitations  and bandlike sensation in epigastric area. She stated it started that morning when she woke up.  She stated sometimes when standing she felt a heaviness down to her feet. Had some intermittent nausea but no vomiting  or diarrhea. Labs were reassuring, Troponin negative x 2. Pain resolved on re-evaluation. CXR was negative for acute process.   She presents today for follow-up.  She states she is concerned about her recurrent chest pain.  She states that last visit to [facility] ED she had a bandlike sensation in her epigastric area plus it radiated through to her back and left shoulder area and radiated to her chest from epigastric area.  She states she was started on a PPI Protonix 40 mg in June 2021 after EGD demonstrating diffuse erythema with intact mucosal in her stomach.  Biopsies were taken with cold forceps for H. pylori testing and CLOtest.  The duodenum was normal.  She states she also notices a burning in her chest. States she had some clamminess when this occurred.   Past Medical History:  Diagnosis Date   Acute cholecystitis 08/24/2016   Acute kidney injury superimposed on chronic kidney disease (HCC) 06/20/2019   Acute respiratory failure with hypoxia (HCC) 06/19/2019   Annual visit for general adult medical examination with abnormal findings 04/16/2019   Arthritis    right knee   Bronchiectasis (HCC)    CKD (chronic kidney disease)    Iron deficiency    Lightheadedness 05/10/2019   PE (pulmonary thromboembolism) (HCC) 06/2019   Pre-syncope 04/16/2019   Sarcoidosis    Sarcoidosis    Sleep apnea    c pap   Smoker 02/12/2014    Past Surgical History:  Procedure Laterality Date   CHOLECYSTECTOMY N/A 08/25/2016   Procedure: LAPAROSCOPIC CHOLECYSTECTOMY;  Surgeon: Abigail Miyamoto, MD;  Location: [facility] OR;  Service: General;  Laterality: N/A;   TUBAL LIGATION  02/1993    Current Outpatient Medications  Medication Sig Dispense Refill   acetaminophen (TYLENOL) 500 MG tablet Take 500 mg  by mouth every 4 (four) hours as needed for mild pain, moderate pain or headache.      albuterol (PROVENTIL) (2.5 MG/3ML) 0.083% nebulizer solution Take 3 mLs (2.5 mg total) by nebulization every 6 (six) hours as needed for wheezing or shortness of breath. 75 mL 1   albuterol (VENTOLIN HFA) 108 (90 Base) MCG/ACT inhaler INHALE 2 PUFFS INTO THE LUNGS EVERY 6 HOURS AS NEEDED FOR WHEEZING OR SHORTNESS OF BREATH (Patient taking differently: Inhale 1-2 puffs into the lungs every 6 (six) hours as needed for wheezing or shortness of breath.) 6.7 g 5   diltiazem (CARDIZEM) 30 MG tablet TAKE 1 TABLET(30 MG) BY MOUTH TWICE DAILY (Patient taking differently: Take 30 mg by mouth 2 (two) times daily.) 180 tablet 3   lisinopril (ZESTRIL) 2.5 MG tablet Take 2.5 mg by mouth daily.     pantoprazole (PROTONIX) 40 MG tablet Take 1 tablet (40 mg total) by mouth daily. 90 tablet 3   predniSONE (DELTASONE) 20 MG tablet Take 2 tablets (40 mg total) by mouth daily with breakfast. 10 tablet 0   rosuvastatin (CRESTOR) 5 MG tablet Take 1 tablet (5 mg  total) by mouth daily. 90 tablet 1   traZODone (DESYREL) 50 MG tablet Take 50-100 mg by mouth at bedtime.     Vitamin D, Ergocalciferol, (DRISDOL) 1.25 MG (50000 UNIT) CAPS capsule Take 1 capsule (50,000 Units total) by mouth every 7 (seven) days. 12 capsule 1   XARELTO 20 MG TABS tablet TAKE 1 TABLET(20 MG) BY MOUTH DAILY WITH SUPPER (Patient taking differently: Take 20 mg by mouth daily with supper.) 90 tablet 2   No current facility-administered medications for this visit.   Allergies:  Patient has no known allergies.   Social History: The patient  reports that she quit smoking about 5 years ago. Her smoking use included cigarettes. She has a 20.00 pack-year smoking history. She has never used smokeless tobacco. She reports current alcohol use. She reports that she does not use drugs.   Family History: The patient's family history includes Cancer in her mother; Colon polyps  in her mother; Healthy in her daughter, sister, sister, sister, sister, son, and son; Hypertension in her mother.   ROS:  Please see the history of present illness. Otherwise, complete review of systems is positive for none.  All other systems are reviewed and negative.   Physical Exam: VS:  BP 136/82   Pulse (!) 58   Ht 5\' 5"  (1.651 m)   Wt 216 lb (98 kg)   SpO2 94%   BMI 35.94 kg/m , BMI Body mass index is 35.94 kg/m.  Wt Readings from Last 3 Encounters:  12/31/20 216 lb (98 kg)  12/29/20 212 lb 9.6 oz (96.4 kg)  12/28/20 213 lb 1.3 oz (96.7 kg)    General: Patient appears comfortable at rest. Neck: Supple, no elevated JVP or carotid bruits, no thyromegaly. Lungs: Clear to auscultation, nonlabored breathing at rest. Cardiac: Regular rate and rhythm, no S3 or significant systolic murmur, no pericardial rub. Extremities: No pitting edema, distal pulses 2+. Skin: Warm and dry. Musculoskeletal: No kyphosis. Neuropsychiatric: Alert and oriented x3, affect grossly appropriate.  ECG:  EKG 01/22/2020 normal sinus rhythm rate of 70  Recent Labwork: 12/23/2020: ALT 18; AST 21 12/26/2020: BUN 12; Creatinine, Ser 1.16; Potassium 3.6; Sodium 139 12/28/2020: Hemoglobin 12.7; Platelets 328     Component Value Date/Time   CHOL 230 (H) 04/22/2019 0839   TRIG 93 04/22/2019 0839   HDL 58 04/22/2019 0839   CHOLHDL 4.0 04/22/2019 0839   VLDL 22 02/17/2014 0831   LDLCALC 152 (H) 04/22/2019 0839    Other Studies Reviewed Today:  Cardiac monitor 10/16/2020 Study Highlights    Patch Wear Time:  13 days and 23 hours (2022-06-14T11:36:38-0400 to 2022-06-28T10:57:53-0400)   Patient had a min HR of 45 bpm, max HR of 151 bpm, and avg HR of 73 bpm. Predominant underlying rhythm was Sinus Rhythm. 11 Supraventricular Tachycardia runs occurred, the run with the fastest interval lasting 9 beats with a max rate of 148 bpm, the longest lasting 12 beats with an avg rate of 122 bpm. Isolated SVEs were  rare (<1.0%), SVE Couplets were rare (<1.0%), and SVE Triplets were rare (<1.0%). Isolated VEs were rare (<1.0%), VE Couplets were rare (<1.0%), and no VE Triplets were present.    Echocardiogram 10/19/2020  1. Left ventricular ejection fraction, by estimation, is 55 to 60%. The left ventricle has normal function. The left ventricle has no regional wall motion abnormalities. Left ventricular diastolic parameters were normal. Normal global longitudinal strain of -20.3%. 2. Right ventricular systolic function is normal. The right ventricular size is normal.  There is normal pulmonary artery systolic pressure. The estimated right ventricular systolic pressure is 35.5 mmHg. 3. The mitral valve is grossly normal. Trivial mitral valve regurgitation. 4. The aortic valve is tricuspid. Aortic valve regurgitation is not visualized. 5. The inferior vena cava is normal in size with greater than 50% respiratory variability, suggesting right atrial pressure of 3 mmHg. Comparison(s): Echocardiogram done 09/13/19 showed an EF of 55-60%.      08/2019 event monitor 30 day event monitor Min HR 47, Max HR 135, Avg HR 70. Min HR occurred in early AM hours presumably while sleeping Symptoms correlate with sinus rhythm No significant arrhythmias   09/2019 echo IMPRESSIONS     1. Left ventricular ejection fraction, by estimation, is 55 to 60%. The  left ventricle has normal function. The left ventricle has no regional  wall motion abnormalities. Left ventricular diastolic parameters were  normal.   2. Right ventricular systolic function is mildly reduced. The right  ventricular size is moderately enlarged. There is mildly elevated  pulmonary artery systolic pressure. The estimated right ventricular  systolic pressure is 35.3 mmHg.   3. Left atrial size was mildly dilated.   4. Right atrial size was mildly dilated.   5. The mitral valve is normal in structure. No evidence of mitral valve  regurgitation.  No evidence of mitral stenosis.   6. Tricuspid valve regurgitation is mild to moderate.   7. The aortic valve is normal in structure. Aortic valve regurgitation is  not visualized. No aortic stenosis is present.   8. The inferior vena cava is normal in size with greater than 50%  respiratory variability, suggesting right atrial pressure of 3 mmHg.        Assessment and Plan:  1. Chest pain of uncertain etiology   2. Palpitations   3. Mixed hyperlipidemia   4. SOB (shortness of breath)   5. Essential hypertension   6. Chest pain, unspecified type   7. Recurrent chest pain      1. Dizziness At last visit she complained of lightheadedness/dizziness which is random in nature and not associated with activity.  She states it can occur at rest or with activity.  She denies any near syncope or syncopal episodes.  However a while back she had an episode where she was coughing heavily and nearly passed out.  Recent ZIO monitor showed 11 episodes of SVT with no significant symptoms.  At last visit she continued to complain of intermittent dizziness/lightheadedness.  She was referred to to [facility] neurology for evaluation.    2. Palpitations History of palpitations on Cardizem 30 mg p.o. twice daily.  States she notices occasional palpitations which are not bothersome.  Continue Cardizem 30 mg p.o. twice daily.   3. Mixed hyperlipidemia Continue Crestor 5 mg p.o. daily.  4.  Shortness of breath At last visit she continued to complain of ongoing shortness of breath.  She does have a history of sarcoidosis.  significant history of smoking.  Recent pulmonary function test Spirometry was suggestive of restrictive defect.  No significant bronchodilator response.  Mild restriction with lung volume testing.  Air trapping as demonstrated by RV: TLC ratio, moderate to severe diffusion defect.  Recent follow-up echocardiogram 10/09/2020 EF 55 to 60%.  No WMA's.  Normal PASP trivial MR.  5.   Hypertension Blood pressure 136/82 .Blood pressure improved since stopping lisinopril at last visit.  Currently on diltiazem 30 mg p.o. twice daily only for palpitations.  6.  History of PE On  long-term Xarelto for history of PE.  Continue Xarelto 20 mg p.o. daily.  No complaints of bleeding  7.  Recurrent chest pain. Having issues with recurrent chest pain with 3 recent visits to ED for very similar symptoms and was ruled out each time.  Given recurrent symptoms we will get an exercise Myoview to rule out ischemic etiology.  Medication Adjustments/Labs and Tests Ordered: Current medicines are reviewed at length with the patient today.  Concerns regarding medicines are outlined above.   Disposition: Follow-up with Dr. Wyline Mood or APP 3 months  Signed, Rennis Harding, NP 12/31/2020 4:17 PM    [facility] [address] [place], [place], [place] [address] Phone: [phone]; Fax: [phone]

## 2020-12-31 ENCOUNTER — Other Ambulatory Visit: Payer: Self-pay

## 2020-12-31 ENCOUNTER — Ambulatory Visit (INDEPENDENT_AMBULATORY_CARE_PROVIDER_SITE_OTHER): Payer: 59 | Admitting: Family Medicine

## 2020-12-31 ENCOUNTER — Encounter: Payer: Self-pay | Admitting: *Deleted

## 2020-12-31 ENCOUNTER — Encounter: Payer: Self-pay | Admitting: Family Medicine

## 2020-12-31 ENCOUNTER — Telehealth: Payer: Self-pay | Admitting: Family Medicine

## 2020-12-31 ENCOUNTER — Ambulatory Visit (INDEPENDENT_AMBULATORY_CARE_PROVIDER_SITE_OTHER): Payer: 59 | Admitting: Orthopaedic Surgery

## 2020-12-31 ENCOUNTER — Encounter: Payer: Self-pay | Admitting: Orthopaedic Surgery

## 2020-12-31 VITALS — BP 136/82 | HR 58 | Ht 65.0 in | Wt 216.0 lb

## 2020-12-31 DIAGNOSIS — R0602 Shortness of breath: Secondary | ICD-10-CM | POA: Diagnosis not present

## 2020-12-31 DIAGNOSIS — R079 Chest pain, unspecified: Secondary | ICD-10-CM | POA: Diagnosis not present

## 2020-12-31 DIAGNOSIS — E782 Mixed hyperlipidemia: Secondary | ICD-10-CM | POA: Diagnosis not present

## 2020-12-31 DIAGNOSIS — R002 Palpitations: Secondary | ICD-10-CM

## 2020-12-31 DIAGNOSIS — M1712 Unilateral primary osteoarthritis, left knee: Secondary | ICD-10-CM | POA: Diagnosis not present

## 2020-12-31 DIAGNOSIS — I10 Essential (primary) hypertension: Secondary | ICD-10-CM

## 2020-12-31 NOTE — Progress Notes (Signed)
Office Visit Note   Patient: Hailey Ross           Date of Birth: [birth year]           MRN: [id number] Visit Date: 12/31/2020              Requested by: Heather Roberts, NP [address]  [address] [place],  [place] [address] PCP: Heather Roberts, NP   Assessment & Plan: Visit Diagnoses:  1. Primary osteoarthritis of left knee     Plan: Impression is recurrent left knee pain with temporary relief from aspiration injection.  At this point we will need an MRI to rule out structural abnormalities.  Patient will follow-up after the MRI.  Follow-Up Instructions: Return for after MRI.   Orders:  Orders Placed This Encounter  Procedures   MR Knee Left w/o contrast   No orders of the defined types were placed in this encounter.     Procedures: No procedures performed   Clinical Data: No additional findings.   Subjective: Chief Complaint  Patient presents with   Left Knee - Pain    HPI  [name] returns today for recurrent left knee pain and swelling.  I saw her approximately 2 months ago and we aspirated 15 cc of fluid from the knee.  She received temporary relief from this but now the symptoms are back.  She also has clicking and popping within her knee as well.  Review of Systems   Objective: Vital Signs: There were no vitals taken for this visit.  Physical Exam  Ortho Exam  Left knee shows small joint effusion.  Painful range of motion.  Collaterals and cruciates are stable.  No warmth or evidence of infection or gout.  Specialty Comments:  No specialty comments available.  Imaging: No results found.   PMFS History: Patient Active Problem List   Diagnosis Date Noted   GERD (gastroesophageal reflux disease) 12/29/2020   Upper abdominal pain 12/28/2020   Primary osteoarthritis of left knee 10/27/2020   Grief at loss of child 06/09/2020   Insomnia 06/09/2020   Right ankle swelling 02/05/2020   Encounter for examination following  treatment at hospital 01/28/2020   Palpitations 01/28/2020   Menorrhagia with regular cycle 10/15/2019   OSA (obstructive sleep apnea) 10/08/2019   Right knee pain 08/29/2019   Positive ANA (antinuclear antibody) 08/29/2019   Essential hypertension 07/04/2019   Pain and swelling of right knee 07/04/2019   Hyperlipidemia 07/04/2019   Stage 2 chronic kidney disease 07/04/2019   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   Encounter for support and coordination of transition of care 07/04/2019   Daytime hypersomnolence 07/02/2019   Pulmonary hypertension (HCC) 07/02/2019   CKD (chronic kidney disease)    Bilateral pulmonary embolism (HCC) 06/18/2019   Lightheadedness 05/10/2019   Vitamin D deficiency 05/10/2019   Class 2 obesity due to excess calories with body mass index (BMI) of 37.0 to 37.9 in adult 04/16/2019   Sarcoidosis 05/04/2015   Bronchiectasis (HCC) 05/04/2015   Past Medical History:  Diagnosis Date   Acute cholecystitis 08/24/2016   Acute kidney injury superimposed on chronic kidney disease (HCC) 06/20/2019   Acute respiratory failure with hypoxia (HCC) 06/19/2019   Annual visit for general adult medical examination with abnormal findings 04/16/2019   Arthritis    right knee   Bronchiectasis (HCC)    CKD (chronic kidney disease)    Iron deficiency    Lightheadedness 05/10/2019   PE (pulmonary thromboembolism) (  HCC) 06/2019   Pre-syncope 04/16/2019   Sarcoidosis    Sarcoidosis    Sleep apnea    c pap   Smoker 02/12/2014    Family History  Problem Relation Age of Onset   Hypertension Mother    Cancer Mother    Colon polyps Mother    Healthy Sister    Healthy Sister    Healthy Sister    Healthy Sister    Healthy Son    Healthy Son    Healthy Daughter    Colon cancer Neg Hx    Esophageal cancer Neg Hx    Rectal cancer Neg Hx    Stomach cancer Neg Hx     Past Surgical History:  Procedure Laterality Date   CHOLECYSTECTOMY N/A 08/25/2016   Procedure: LAPAROSCOPIC  CHOLECYSTECTOMY;  Surgeon: Abigail Miyamoto, MD;  Location: [facility] OR;  Service: General;  Laterality: N/A;   TUBAL LIGATION  02/1993   Social History   Occupational History   Occupation: Administrator, [employer] ([employer])  Tobacco Use   Smoking status: Former    Packs/day: 1.00    Years: 20.00    Pack years: 20.00    Types: Cigarettes    Quit date: 03/21/2015    Years since quitting: 5.7   Smokeless tobacco: Never  Vaping Use   Vaping Use: Never used  Substance and Sexual Activity   Alcohol use: Yes    Alcohol/week: 0.0 standard drinks    Comment: Rare   Drug use: No   Sexual activity: Yes    Birth control/protection: Surgical

## 2020-12-31 NOTE — Patient Instructions (Signed)
Medication Instructions:  Continue all current medications.  Labwork: none  Testing/Procedures:  Your physician has requested that you have an exercise stress myoview. For further information please visit [email or web address]. Please follow instruction sheet, as given.  Office will contact with results via phone or letter.    Follow-Up: 6-8 weeks   Any Other Special Instructions Will Be Listed Below (If Applicable).  If you need a refill on your cardiac medications before your next appointment, please call your pharmacy.  

## 2020-12-31 NOTE — Telephone Encounter (Signed)
Checking percert on the following patient for testing scheduled at [facility].     exercise mv - cp

## 2021-01-01 ENCOUNTER — Other Ambulatory Visit: Payer: Self-pay | Admitting: *Deleted

## 2021-01-01 ENCOUNTER — Encounter: Payer: Self-pay | Admitting: *Deleted

## 2021-01-01 DIAGNOSIS — R079 Chest pain, unspecified: Secondary | ICD-10-CM

## 2021-01-03 ENCOUNTER — Encounter: Payer: Self-pay | Admitting: Orthopaedic Surgery

## 2021-01-03 ENCOUNTER — Other Ambulatory Visit: Payer: Self-pay

## 2021-01-03 ENCOUNTER — Emergency Department (HOSPITAL_BASED_OUTPATIENT_CLINIC_OR_DEPARTMENT_OTHER)
Admission: EM | Admit: 2021-01-03 | Discharge: 2021-01-03 | Disposition: A | Discharge status: Home / Self Care | Payer: 59 | Attending: Emergency Medicine | Admitting: Emergency Medicine

## 2021-01-03 ENCOUNTER — Encounter (HOSPITAL_BASED_OUTPATIENT_CLINIC_OR_DEPARTMENT_OTHER): Payer: Self-pay

## 2021-01-03 ENCOUNTER — Emergency Department (HOSPITAL_BASED_OUTPATIENT_CLINIC_OR_DEPARTMENT_OTHER): Payer: 59

## 2021-01-03 DIAGNOSIS — M7122 Synovial cyst of popliteal space [Baker], left knee: Secondary | ICD-10-CM | POA: Diagnosis not present

## 2021-01-03 DIAGNOSIS — Z87891 Personal history of nicotine dependence: Secondary | ICD-10-CM | POA: Insufficient documentation

## 2021-01-03 DIAGNOSIS — N182 Chronic kidney disease, stage 2 (mild): Secondary | ICD-10-CM | POA: Diagnosis not present

## 2021-01-03 DIAGNOSIS — M25562 Pain in left knee: Secondary | ICD-10-CM | POA: Diagnosis present

## 2021-01-03 DIAGNOSIS — Z79899 Other long term (current) drug therapy: Secondary | ICD-10-CM | POA: Diagnosis not present

## 2021-01-03 DIAGNOSIS — Z7901 Long term (current) use of anticoagulants: Secondary | ICD-10-CM | POA: Diagnosis not present

## 2021-01-03 DIAGNOSIS — Z86711 Personal history of pulmonary embolism: Secondary | ICD-10-CM | POA: Diagnosis not present

## 2021-01-03 DIAGNOSIS — I129 Hypertensive chronic kidney disease with stage 1 through stage 4 chronic kidney disease, or unspecified chronic kidney disease: Secondary | ICD-10-CM | POA: Insufficient documentation

## 2021-01-03 NOTE — ED Triage Notes (Signed)
Patient arrives from home with c/o left lower leg pain along with swelling in extremity.

## 2021-01-03 NOTE — Discharge Instructions (Addendum)
You were seen today for left leg pain.  It appears that you likely have a Baker's cyst behind the left knee.  Keep your leg iced and elevated.  Take Tylenol as needed for pain.  Make sure that you are resting.

## 2021-01-03 NOTE — ED Provider Notes (Signed)
[facility] EMERGENCY DEPT Provider Note   CSN: [id number] Arrival date & time: 01/03/21  2127     History Chief Complaint  Patient presents with   Leg Pain    Hailey Ross is a 50 y.o. female.  HPI     This a 50 year old female with a history of sarcoidosis, PE on Xarelto, chronic kidney disease who presents with left knee and leg pain.  Patient reports 1 week history of left knee and leg pain.  It is primarily in the posterior aspect of her knee.  She noted some swelling but today noted swelling of the left calf and left leg.  This concerned her.  She reports compliance with her Xarelto.  She states her pain is 9 out of 10 when she is ambulating.  She denies injury.  She denies numbness or tingling.  She has taken some Tylenol with minimal relief.  Past Medical History:  Diagnosis Date   Acute cholecystitis 08/24/2016   Acute kidney injury superimposed on chronic kidney disease (HCC) 06/20/2019   Acute respiratory failure with hypoxia (HCC) 06/19/2019   Annual visit for general adult medical examination with abnormal findings 04/16/2019   Arthritis    right knee   Bronchiectasis (HCC)    CKD (chronic kidney disease)    Iron deficiency    Lightheadedness 05/10/2019   PE (pulmonary thromboembolism) (HCC) 06/2019   Pre-syncope 04/16/2019   Sarcoidosis    Sarcoidosis    Sleep apnea    c pap   Smoker 02/12/2014    Patient Active Problem List   Diagnosis Date Noted   GERD (gastroesophageal reflux disease) 12/29/2020   Upper abdominal pain 12/28/2020   Primary osteoarthritis of left knee 10/27/2020   Grief at loss of child 06/09/2020   Insomnia 06/09/2020   Right ankle swelling 02/05/2020   Encounter for examination following treatment at hospital 01/28/2020   Palpitations 01/28/2020   Menorrhagia with regular cycle 10/15/2019   OSA (obstructive sleep apnea) 10/08/2019   Right knee pain 08/29/2019   Positive ANA (antinuclear antibody) 08/29/2019    Essential hypertension 07/04/2019   Pain and swelling of right knee 07/04/2019   Hyperlipidemia 07/04/2019   Stage 2 chronic kidney disease 07/04/2019   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   Encounter for support and coordination of transition of care 07/04/2019   Daytime hypersomnolence 07/02/2019   Pulmonary hypertension (HCC) 07/02/2019   CKD (chronic kidney disease)    Bilateral pulmonary embolism (HCC) 06/18/2019   Lightheadedness 05/10/2019   Vitamin D deficiency 05/10/2019   Class 2 obesity due to excess calories with body mass index (BMI) of 37.0 to 37.9 in adult 04/16/2019   Sarcoidosis 05/04/2015   Bronchiectasis (HCC) 05/04/2015    Past Surgical History:  Procedure Laterality Date   CHOLECYSTECTOMY N/A 08/25/2016   Procedure: LAPAROSCOPIC CHOLECYSTECTOMY;  Surgeon: Abigail Miyamoto, MD;  Location: [facility] OR;  Service: General;  Laterality: N/A;   TUBAL LIGATION  02/1993     OB History     Gravida  3   Para  3   Term  3   Preterm      AB      Living  3      SAB      IAB      Ectopic      Multiple      Live Births              Family History  Problem Relation Age of Onset  Hypertension Mother    Cancer Mother    Colon polyps Mother    Healthy Sister    Healthy Sister    Healthy Sister    Healthy Sister    Healthy Son    Healthy Son    Healthy Daughter    Colon cancer Neg Hx    Esophageal cancer Neg Hx    Rectal cancer Neg Hx    Stomach cancer Neg Hx     Social History   Tobacco Use   Smoking status: Former    Packs/day: 1.00    Years: 20.00    Pack years: 20.00    Types: Cigarettes    Quit date: 03/21/2015    Years since quitting: 5.7   Smokeless tobacco: Never  Vaping Use   Vaping Use: Never used  Substance Use Topics   Alcohol use: Yes    Alcohol/week: 0.0 standard drinks    Comment: Rare   Drug use: No    Home Medications Prior to Admission medications   Medication Sig Start Date End Date Taking? Authorizing  Provider  acetaminophen (TYLENOL) 500 MG tablet Take 500 mg by mouth every 4 (four) hours as needed for mild pain, moderate pain or headache.     [provider]  albuterol (PROVENTIL) (2.5 MG/3ML) 0.083% nebulizer solution Take 3 mLs (2.5 mg total) by nebulization every 6 (six) hours as needed for wheezing or shortness of breath. 07/22/19   Parrett, Virgel Bouquet, NP  albuterol (VENTOLIN HFA) 108 (90 Base) MCG/ACT inhaler INHALE 2 PUFFS INTO THE LUNGS EVERY 6 HOURS AS NEEDED FOR WHEEZING OR SHORTNESS OF BREATH Patient taking differently: Inhale 1-2 puffs into the lungs every 6 (six) hours as needed for wheezing or shortness of breath. 07/09/20   Coralyn Helling, MD  diltiazem (CARDIZEM) 30 MG tablet TAKE 1 TABLET(30 MG) BY MOUTH TWICE DAILY Patient taking differently: Take 30 mg by mouth 2 (two) times daily. 08/21/20   Antoine Poche, MD  lisinopril (ZESTRIL) 2.5 MG tablet Take 2.5 mg by mouth daily. 12/21/20   [provider]  pantoprazole (PROTONIX) 40 MG tablet Take 1 tablet (40 mg total) by mouth daily. 12/29/20   Parrett, Virgel Bouquet, NP  predniSONE (DELTASONE) 20 MG tablet Take 2 tablets (40 mg total) by mouth daily with breakfast. 12/28/20   Heather Roberts, NP  rosuvastatin (CRESTOR) 5 MG tablet Take 1 tablet (5 mg total) by mouth daily. 12/22/20   Heather Roberts, NP  traZODone (DESYREL) 50 MG tablet Take 50-100 mg by mouth at bedtime. 11/05/20   [provider]  Vitamin D, Ergocalciferol, (DRISDOL) 1.25 MG (50000 UNIT) CAPS capsule Take 1 capsule (50,000 Units total) by mouth every 7 (seven) days. 12/22/20   Heather Roberts, NP  XARELTO 20 MG TABS tablet TAKE 1 TABLET(20 MG) BY MOUTH DAILY WITH SUPPER Patient taking differently: Take 20 mg by mouth daily with supper. 08/27/20   Coralyn Helling, MD    Allergies    Patient has no known allergies.  Review of Systems   Review of Systems  Constitutional:  Negative for fever.  Respiratory:  Negative for shortness of breath.    Cardiovascular:  Positive for leg swelling. Negative for chest pain.  Musculoskeletal:        Left leg and knee pain  Neurological:  Negative for weakness and numbness.  All other systems reviewed and are negative.  Physical Exam Updated Vital Signs BP (!) 146/95   Pulse 69   Temp 97.8  F (36.6 C)   Resp 16   Ht 1.651 m (5\' 5" )   Wt 98 kg   SpO2 93%   BMI 35.94 kg/m   Physical Exam Vitals and nursing note reviewed.  Constitutional:      Appearance: She is well-developed. She is obese. She is not ill-appearing.  HENT:     Head: Normocephalic and atraumatic.     Nose: Nose normal.     Mouth/Throat:     Mouth: Mucous membranes are moist.  Eyes:     General: No scleral icterus. Cardiovascular:     Rate and Rhythm: Normal rate and regular rhythm.  Pulmonary:     Effort: Pulmonary effort is normal. No respiratory distress.  Abdominal:     Palpations: Abdomen is soft.  Musculoskeletal:     Cervical back: Neck supple.     Comments: Tenderness palpation left popliteal fossa with mild associated swelling, no overlying skin changes, normal range of motion of the knee, tenderness palpation left calf slight asymmetric swelling of the posterior calf, 2+ DP pulse  Skin:    General: Skin is warm and dry.  Neurological:     Mental Status: She is alert and oriented to person, place, and time.  Psychiatric:        Mood and Affect: Mood normal.    ED Results / Procedures / Treatments   Labs (all labs ordered are listed, but only abnormal results are displayed) Labs Reviewed - No data to display  EKG None  Radiology Venous Img Lower  Left (DVT Study)  Result Date: 01/03/2021 CLINICAL DATA:  Edema. EXAM: Left LOWER EXTREMITY VENOUS DOPPLER ULTRASOUND TECHNIQUE: Gray-scale sonography with compression, as well as color and duplex ultrasound, were performed to evaluate the deep venous system(s) from the level of the common femoral vein through the popliteal and proximal calf  veins. COMPARISON:  X-ray left knee 10/27/2020 FINDINGS: VENOUS Normal compressibility of the common femoral, superficial femoral, and popliteal veins, as well as the visualized calf veins. Visualized portions of profunda femoral vein and great saphenous vein unremarkable. No filling defects to suggest DVT on grayscale or color Doppler imaging. Doppler waveforms show normal direction of venous flow, normal respiratory plasticity and response to augmentation. Limited views of the contralateral common femoral vein are unremarkable. OTHER There is a 2.9 x 2.3 x 1.3 cm fluid density lesion along the medial left popliteal fossa. Limitations: none IMPRESSION: 1. No left lower extremity deep venous thrombosis. 2. A 2.9 x 2.3 x 1.3 cm fluid density lesion within the medial popliteal fossa may represent a Baker's cyst versus liquified hematoma versus large meniscal cyst. Electronically Signed   By: 10/29/2020 M.D.   On: 01/03/2021 22:31    Procedures Procedures   Medications Ordered in ED Medications - No data to display  ED Course  I have reviewed the triage vital signs and the nursing notes.  Pertinent labs & imaging results that were available during my care of the patient were reviewed by me and considered in my medical decision making (see chart for details).    MDM Rules/Calculators/A&P                           Patient presents with pain and swelling of the left leg and knee.  She is nontoxic and vital signs are reassuring.  She is tenderness over the posterior aspect of the left knee.  There is no joint swelling and normal range  of motion, considerations include but not limited to, breakthrough DVT, Baker's cyst, less likely joint effusion or septic arthritis.  Ultrasound was obtained.  No evidence of DVT.  There is a fluid collection in the popliteal fossa that was suggestive of a Baker's cyst.  Clinically this correlates.  We discussed symptom control.  Patient not a candidate for NSAIDs  because she is on Xarelto.  Ice, elevation, and rest recommended.  After history, exam, and medical workup I feel the patient has been appropriately medically screened and is safe for discharge home. Pertinent diagnoses were discussed with the patient. Patient was given return precautions.  Final Clinical Impression(s) / ED Diagnoses Final diagnoses:  Baker's cyst of knee, left    Rx / DC Orders ED Discharge Orders     None        Shon Baton, MD 01/03/21 2303

## 2021-01-04 ENCOUNTER — Other Ambulatory Visit (HOSPITAL_COMMUNITY): Payer: 59

## 2021-01-04 ENCOUNTER — Ambulatory Visit: Payer: 59 | Admitting: Pulmonary Disease

## 2021-01-04 ENCOUNTER — Encounter (HOSPITAL_COMMUNITY): Payer: 59

## 2021-01-08 ENCOUNTER — Other Ambulatory Visit: Payer: Self-pay | Admitting: Physician Assistant

## 2021-01-11 ENCOUNTER — Encounter: Payer: Self-pay | Admitting: Orthopaedic Surgery

## 2021-01-15 ENCOUNTER — Ambulatory Visit (HOSPITAL_COMMUNITY)
Admission: RE | Admit: 2021-01-15 | Discharge: 2021-01-15 | Disposition: A | Discharge status: Home / Self Care | Payer: 59 | Source: Ambulatory Visit | Attending: Orthopaedic Surgery | Admitting: Orthopaedic Surgery

## 2021-01-15 ENCOUNTER — Other Ambulatory Visit: Payer: Self-pay

## 2021-01-15 DIAGNOSIS — M1712 Unilateral primary osteoarthritis, left knee: Secondary | ICD-10-CM | POA: Insufficient documentation

## 2021-01-19 ENCOUNTER — Other Ambulatory Visit: Payer: Self-pay

## 2021-01-19 ENCOUNTER — Encounter: Payer: Self-pay | Admitting: Orthopaedic Surgery

## 2021-01-19 ENCOUNTER — Ambulatory Visit (INDEPENDENT_AMBULATORY_CARE_PROVIDER_SITE_OTHER): Payer: 59 | Admitting: Orthopaedic Surgery

## 2021-01-19 DIAGNOSIS — M1712 Unilateral primary osteoarthritis, left knee: Secondary | ICD-10-CM | POA: Diagnosis not present

## 2021-01-19 DIAGNOSIS — M25562 Pain in left knee: Secondary | ICD-10-CM

## 2021-01-19 DIAGNOSIS — M659 Synovitis and tenosynovitis, unspecified: Secondary | ICD-10-CM

## 2021-01-19 MED ORDER — BUPIVACAINE HCL 0.5 % IJ SOLN
2.0000 mL | INTRAMUSCULAR | Status: AC | PRN
  Administered 2021-01-19: 2 mL via INTRA_ARTICULAR

## 2021-01-19 MED ORDER — LIDOCAINE HCL 1 % IJ SOLN
2.0000 mL | INTRAMUSCULAR | Status: AC | PRN
  Administered 2021-01-19: 2 mL

## 2021-01-19 MED ORDER — METHYLPREDNISOLONE ACETATE 40 MG/ML IJ SUSP
40.0000 mg | INTRAMUSCULAR | Status: AC | PRN
  Administered 2021-01-19: 40 mg via INTRA_ARTICULAR

## 2021-01-19 NOTE — Progress Notes (Signed)
Office Visit Note   Patient: Hailey Ross           Date of Birth: 30-Jan-1971           MRN: [id number] Visit Date: 01/19/2021              Requested by: Heather Roberts, NP [address]  [address] [place],  [place] [address] PCP: Heather Roberts, NP   Assessment & Plan: Visit Diagnoses:  1. Primary osteoarthritis of left knee   2. Synovitis of left knee     Plan: MRI of the left knee shows mainly chondromalacia of the patellofemoral compartment with synovitis in the femoral notch lateral to the ACL.  She also has a multiloculated Baker's cyst which appears hemorrhagic.  These results were reviewed with the patient in detail.  She did have a prior cortisone injection with aspiration in July which really helped her symptoms and she would like to repeat this.  She is on Xarelto for history of PE which likely contributes to the hemorrhagic Baker's cyst.  She would like to try to avoid surgery if possible which I agree with.  Follow-Up Instructions: Return if symptoms worsen or fail to improve.   Orders:  Orders Placed This Encounter  Procedures   Large Joint Inj    No orders of the defined types were placed in this encounter.     Procedures: Large Joint Inj: L knee on 01/19/2021 11:17 AM Details: 22 G needle Medications: 2 mL bupivacaine 0.5 %; 2 mL lidocaine 1 %; 40 mg methylPREDNISolone acetate 40 MG/ML Outcome: tolerated well, no immediate complications Patient was prepped and draped in the usual sterile fashion.      Clinical Data: No additional findings.   Subjective: Chief Complaint  Patient presents with   Left Knee - Pain    [name] is here to discuss left knee MRI.  Continues to experience swelling in her lower leg.   Review of Systems  Constitutional: Negative.   HENT: Negative.    Eyes: Negative.   Respiratory: Negative.    Cardiovascular: Negative.   Endocrine: Negative.   Musculoskeletal: Negative.   Neurological: Negative.    Hematological: Negative.   Psychiatric/Behavioral: Negative.    All other systems reviewed and are negative.   Objective: Vital Signs: There were no vitals taken for this visit.  Physical Exam Vitals and nursing note reviewed.  Constitutional:      Appearance: She is well-developed.  Pulmonary:     Effort: Pulmonary effort is normal.  Skin:    General: Skin is warm.     Capillary Refill: Capillary refill takes less than 2 seconds.  Neurological:     Mental Status: She is alert and oriented to person, place, and time.  Psychiatric:        Behavior: Behavior normal.        Thought Content: Thought content normal.        Judgment: Judgment normal.    Ortho Exam  Left knee examination shows trace joint effusion.  No joint line tenderness.  She has no tenderness to the popliteal fossa.  There is no palpable masses.  Exam is otherwise unchanged.  Specialty Comments:  No specialty comments available.  Imaging: No results found.   PMFS History: Patient Active Problem List   Diagnosis Date Noted   Synovitis of left knee 01/19/2021   GERD (gastroesophageal reflux disease) 12/29/2020   Upper abdominal pain 12/28/2020   Primary osteoarthritis of left  knee 10/27/2020   Grief at loss of child 06/09/2020   Insomnia 06/09/2020   Right ankle swelling 02/05/2020   Encounter for examination following treatment at hospital 01/28/2020   Palpitations 01/28/2020   Menorrhagia with regular cycle 10/15/2019   OSA (obstructive sleep apnea) 10/08/2019   Right knee pain 08/29/2019   Positive ANA (antinuclear antibody) 08/29/2019   Essential hypertension 07/04/2019   Pain and swelling of right knee 07/04/2019   Hyperlipidemia 07/04/2019   Stage 2 chronic kidney disease 07/04/2019   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   Encounter for support and coordination of transition of care 07/04/2019   Daytime hypersomnolence 07/02/2019   Pulmonary hypertension (HCC) 07/02/2019   CKD (chronic  kidney disease)    Bilateral pulmonary embolism (HCC) 06/18/2019   Lightheadedness 05/10/2019   Vitamin D deficiency 05/10/2019   Class 2 obesity due to excess calories with body mass index (BMI) of 37.0 to 37.9 in adult 04/16/2019   Sarcoidosis 05/04/2015   Bronchiectasis (HCC) 05/04/2015   Past Medical History:  Diagnosis Date   Acute cholecystitis 08/24/2016   Acute kidney injury superimposed on chronic kidney disease (HCC) 06/20/2019   Acute respiratory failure with hypoxia (HCC) 06/19/2019   Annual visit for general adult medical examination with abnormal findings 04/16/2019   Arthritis    right knee   Bronchiectasis (HCC)    CKD (chronic kidney disease)    Iron deficiency    Lightheadedness 05/10/2019   PE (pulmonary thromboembolism) (HCC) 06/2019   Pre-syncope 04/16/2019   Sarcoidosis    Sarcoidosis    Sleep apnea    c pap   Smoker 02/12/2014    Family History  Problem Relation Age of Onset   Hypertension Mother    Cancer Mother    Colon polyps Mother    Healthy Sister    Healthy Sister    Healthy Sister    Healthy Sister    Healthy Son    Healthy Son    Healthy Daughter    Colon cancer Neg Hx    Esophageal cancer Neg Hx    Rectal cancer Neg Hx    Stomach cancer Neg Hx     Past Surgical History:  Procedure Laterality Date   CHOLECYSTECTOMY N/A 08/25/2016   Procedure: LAPAROSCOPIC CHOLECYSTECTOMY;  Surgeon: Abigail Miyamoto, MD;  Location: [facility] OR;  Service: General;  Laterality: N/A;   TUBAL LIGATION  02/1993   Social History   Occupational History   Occupation: Administrator, [employer] ([employer])  Tobacco Use   Smoking status: Former    Packs/day: 1.00    Years: 20.00    Pack years: 20.00    Types: Cigarettes    Quit date: 03/21/2015    Years since quitting: 5.8   Smokeless tobacco: Never  Vaping Use   Vaping Use: Never used  Substance and Sexual Activity   Alcohol use: Yes    Alcohol/week: 0.0 standard drinks    Comment: Rare   Drug use: No   Sexual activity:  Yes    Birth control/protection: Surgical

## 2021-01-21 NOTE — Progress Notes (Signed)
NEUROLOGY CONSULTATION NOTE  [name] MRN: [id number] DOB: 1970-10-05  Referring provider: Ramon Dredge, NP Primary care provider: Bjorn Pippin, NP  Reason for consult:  dizziness, left arm numbness  Assessment/Plan:   Episodic lightheadedness/dizziness - not positional.  Low suspicion for migraine, seizure, or vertebrobasilar insufficiency but will evaluate to complete neurologic workup. Left arm numbness - consider carpal tunnel syndrome Numbness and tingling in toes - may be underlying small fiber neuropathy - she has sarcoidosis.  Autoimmune workup negative for Sjogren's.  No history of diabetes/pre-diabetes.  Prior history of B12 deficiency.   1  Check CTA of head to evaluate for vertebrobasilar insufficiency 2  EEG to evaluate for epileptiform discharges 3  NCV-EMG left upper extremity to evaluate for carpal tunnel syndrome 4 For further workup of neuropathy, check B12, folate, SPEP/IFE and TSH 5  Follow up after testing.   Subjective:  Hailey Ross is a 50 year old left-handed female with sarcoidosis, CKD and history of PE who presents for dizziness and left arm numbness.  History supplemented by cardiology and prior neurologist's notes.    She began having dizzy spells in November 2020.  It is not positional and would come on suddenly.  It is a sensation of feeling "loopy" and would last for a couple of seconds.  Sometimes she may see black spots but no tunnel vision or double vision.  Sometimes she may have palpitations.  No headache, nausea, diaphoresis, focal numbness or weakness.  Frequency varies. It may occur frequently for several days and she may have days to weeks without episodes.  In January 2021, she had a coughing spell that caused her to momentarily loss consciousness for a couple of seconds.  She did not fall but was holding a mug and the next thing she saw it on the floor.  There was some associated palpitations and tachycardia as well.  30 day  cardiac event monitor in May 2021 showed no significant arrhythmias.  Echocardiogram in June 2021 showed EF 55-60% with no wall motion abnormalities, mildly elevated PASP of 35.3 mmHg, mildly dilated LA and RA and mild to moderate tricuspid regurgitation.  She saw neurology in November 2021.  MRI of brain with and without contrast on 02/25/2020 personally reviewed showed a partially empty sella but overall unremarkable study.  Carotid ultrasound on 03/02/2020 showed no hemodynamically significant stenosis.  Etiology believed to be multifactorial, related to dehydration, low blood pressure, menstrual cycle and pulmonary sarcoidosis.  She also had been experiencing atypical chest pain and palpitations.  She has followed up with cardiology.  Repeat echo overall unchanged.  Repeat cardiac event monitor showed 11 runs of SVT, longest lasting 9 beats with maximum rate of 148 but no significant sustained arrhythmias.  Lisinopril was stopped due to low blood pressure, but no change in the dizziness.  She also reports numbness in the left hand and arm.  Mostly notices it laying in bed.  No pain or weakness.  For a few months, she also notes numbness and tingling in her toes.  She reports treated for B12 deficiency last year but not currently.  She also reports tinnitus.    PAST MEDICAL HISTORY: Past Medical History:  Diagnosis Date   Acute cholecystitis 08/24/2016   Acute kidney injury superimposed on chronic kidney disease (HCC) 06/20/2019   Acute respiratory failure with hypoxia (HCC) 06/19/2019   Annual visit for general adult medical examination with abnormal findings 04/16/2019   Arthritis    right knee  Bronchiectasis (HCC)    CKD (chronic kidney disease)    Iron deficiency    Lightheadedness 05/10/2019   PE (pulmonary thromboembolism) (HCC) 06/2019   Pre-syncope 04/16/2019   Sarcoidosis    Sarcoidosis    Sleep apnea    c pap   Smoker 02/12/2014    PAST SURGICAL HISTORY: Past Surgical History:   Procedure Laterality Date   CHOLECYSTECTOMY N/A 08/25/2016   Procedure: LAPAROSCOPIC CHOLECYSTECTOMY;  Surgeon: Abigail Miyamoto, MD;  Location: [facility] OR;  Service: General;  Laterality: N/A;   TUBAL LIGATION  02/1993    MEDICATIONS: Current Outpatient Medications on File Prior to Visit  Medication Sig Dispense Refill   acetaminophen (TYLENOL) 500 MG tablet Take 500 mg by mouth every 4 (four) hours as needed for mild pain, moderate pain or headache.      albuterol (PROVENTIL) (2.5 MG/3ML) 0.083% nebulizer solution Take 3 mLs (2.5 mg total) by nebulization every 6 (six) hours as needed for wheezing or shortness of breath. 75 mL 1   albuterol (VENTOLIN HFA) 108 (90 Base) MCG/ACT inhaler INHALE 2 PUFFS INTO THE LUNGS EVERY 6 HOURS AS NEEDED FOR WHEEZING OR SHORTNESS OF BREATH (Patient taking differently: Inhale 1-2 puffs into the lungs every 6 (six) hours as needed for wheezing or shortness of breath.) 6.7 g 5   diltiazem (CARDIZEM) 30 MG tablet TAKE 1 TABLET(30 MG) BY MOUTH TWICE DAILY (Patient taking differently: Take 30 mg by mouth 2 (two) times daily.) 180 tablet 3   lisinopril (ZESTRIL) 2.5 MG tablet Take 2.5 mg by mouth daily.     pantoprazole (PROTONIX) 40 MG tablet Take 1 tablet (40 mg total) by mouth daily. 90 tablet 3   predniSONE (DELTASONE) 20 MG tablet Take 2 tablets (40 mg total) by mouth daily with breakfast. 10 tablet 0   rosuvastatin (CRESTOR) 5 MG tablet Take 1 tablet (5 mg total) by mouth daily. 90 tablet 1   traZODone (DESYREL) 50 MG tablet Take 50-100 mg by mouth at bedtime.     Vitamin D, Ergocalciferol, (DRISDOL) 1.25 MG (50000 UNIT) CAPS capsule Take 1 capsule (50,000 Units total) by mouth every 7 (seven) days. 12 capsule 1   XARELTO 20 MG TABS tablet TAKE 1 TABLET(20 MG) BY MOUTH DAILY WITH SUPPER (Patient taking differently: Take 20 mg by mouth daily with supper.) 90 tablet 2   No current facility-administered medications on file prior to visit.    ALLERGIES: No Known  Allergies  FAMILY HISTORY: Family History  Problem Relation Age of Onset   Hypertension Mother    Cancer Mother    Colon polyps Mother    Healthy Sister    Healthy Sister    Healthy Sister    Healthy Sister    Healthy Son    Healthy Son    Healthy Daughter    Colon cancer Neg Hx    Esophageal cancer Neg Hx    Rectal cancer Neg Hx    Stomach cancer Neg Hx     Objective:  Blood pressure 134/87, pulse (!) 59, height 5\' 5"  (1.651 m), weight 214 lb 9.6 oz (97.3 kg), SpO2 98 %. General: No acute distress.  Patient appears well-groomed.   Head:  Normocephalic/atraumatic Eyes:  fundi examined but not visualized Neck: supple, no paraspinal tenderness, full range of motion Back: No paraspinal tenderness Heart: regular rate and rhythm Lungs: Clear to auscultation bilaterally. Vascular: No carotid bruits. Neurological Exam: Mental status: alert and oriented to person, place, and time, recent and remote memory intact,  fund of knowledge intact, attention and concentration intact, speech fluent and not dysarthric, language intact. Cranial nerves: CN I: not tested CN II: pupils equal, round and reactive to light, visual fields intact CN III, IV, VI:  full range of motion, no nystagmus, no ptosis CN V: facial sensation intact. CN VII: upper and lower face symmetric CN VIII: hearing intact CN IX, X: gag intact, uvula midline CN XI: sternocleidomastoid and trapezius muscles intact CN XII: tongue midline Bulk & Tone: normal, no fasciculations. Motor:  muscle strength 5/5 throughout Sensation:  Pinprick, temperature and vibratory sensation intact. Deep Tendon Reflexes:  2+ throughout,  toes downgoing.   Finger to nose testing:  Without dysmetria.   Heel to shin:  Without dysmetria.   Gait:  Normal station and stride.  Romberg negative.    Thank you for allowing me to take part in the care of this patient.  Shon Millet, DO  CC:  Bjorn Pippin, NP  Ramon Dredge, NP

## 2021-01-22 ENCOUNTER — Other Ambulatory Visit: Payer: 59

## 2021-01-22 ENCOUNTER — Encounter: Payer: Self-pay | Admitting: Neurology

## 2021-01-22 ENCOUNTER — Ambulatory Visit (INDEPENDENT_AMBULATORY_CARE_PROVIDER_SITE_OTHER): Payer: 59 | Admitting: Neurology

## 2021-01-22 ENCOUNTER — Other Ambulatory Visit: Payer: Self-pay

## 2021-01-22 VITALS — BP 134/87 | HR 59 | Ht 65.0 in | Wt 214.6 lb

## 2021-01-22 DIAGNOSIS — R42 Dizziness and giddiness: Secondary | ICD-10-CM | POA: Diagnosis not present

## 2021-01-22 DIAGNOSIS — R200 Anesthesia of skin: Secondary | ICD-10-CM

## 2021-01-22 DIAGNOSIS — R202 Paresthesia of skin: Secondary | ICD-10-CM

## 2021-01-22 LAB — B12 AND FOLATE PANEL
Folate: 5.7 ng/mL — ABNORMAL LOW (ref >5.9)
Vitamin B-12: 172 pg/mL — ABNORMAL LOW (ref 211–911)

## 2021-01-22 LAB — TSH: TSH: 1.17 u[IU]/mL (ref 0.35–5.50)

## 2021-01-22 NOTE — Patient Instructions (Addendum)
For dizziness: check CTA of head to evaluate for vertebrobasilar insufficiency and routine EEG to evaluate for possible seizure Nerve study of left arm to evaluate for carpal tunnel syndrome For neuropathy, check B12, folate, TSH. Your provider has requested that you have labwork completed today. Please go to [facility] ([phone]) on the second floor of this building before leaving the office today. You do not need to check in. If you are not called within 15 minutes please check with the front desk.   Follow up after testing

## 2021-01-25 LAB — PROTEIN ELECTROPHORESIS, SERUM
Albumin ELP: 3.9 g/dL (ref 3.8–4.8)
Alpha 1: 0.3 g/dL (ref 0.2–0.3)
Alpha 2: 0.8 g/dL (ref 0.5–0.9)
Beta 2: 0.5 g/dL (ref 0.2–0.5)
Beta Globulin: 0.5 g/dL (ref 0.4–0.6)
Gamma Globulin: 1 g/dL (ref 0.8–1.7)
Total Protein: 7 g/dL (ref 6.1–8.1)

## 2021-01-26 ENCOUNTER — Other Ambulatory Visit: Payer: Self-pay | Admitting: *Deleted

## 2021-01-26 ENCOUNTER — Encounter: Payer: Self-pay | Admitting: Gastroenterology

## 2021-01-26 ENCOUNTER — Ambulatory Visit (INDEPENDENT_AMBULATORY_CARE_PROVIDER_SITE_OTHER): Payer: 59 | Admitting: Gastroenterology

## 2021-01-26 VITALS — BP 108/70 | HR 70 | Ht 65.0 in | Wt 214.1 lb

## 2021-01-26 DIAGNOSIS — R0789 Other chest pain: Secondary | ICD-10-CM | POA: Diagnosis not present

## 2021-01-26 DIAGNOSIS — R1013 Epigastric pain: Secondary | ICD-10-CM | POA: Diagnosis not present

## 2021-01-26 NOTE — Progress Notes (Signed)
01/26/2021 Hailey Ross [id number] [birth year]   HISTORY OF PRESENT ILLNESS: This is a 50 year old female who is a patient of Dr. Lamar Sprinkles.  She is here for an ER follow-up of upper abdominal pain and atypical chest pain.  She tells me that she was in the ER on September 17 with complaints of upper abdominal pain that then radiated into her chest.  She said it felt like there is a band around the top of her abdomen.  She says that she started to notice it for about 2 weeks before that, but then it got more severe so she went to the emergency department.  They ruled out cardiac cause of chest pain.  She is on pantoprazole 40 mg daily and has been on that ongoingly.  She does not have a gallbladder.  She had an EGD with Dr. Marina Goodell in June 2021 that showed nonspecific gastric erythema and GERD.  She says that since her ER visit she has not experienced any further pain.  She said her appetite has been good and her symptoms never really affected her appetite anyway.  No dysphagia.   Past Medical History:  Diagnosis Date   Acute cholecystitis 08/24/2016   Acute kidney injury superimposed on chronic kidney disease (HCC) 06/20/2019   Acute respiratory failure with hypoxia (HCC) 06/19/2019   Annual visit for general adult medical examination with abnormal findings 04/16/2019   Arthritis    right knee   Bronchiectasis (HCC)    CKD (chronic kidney disease)    Iron deficiency    Lightheadedness 05/10/2019   PE (pulmonary thromboembolism) (HCC) 06/2019   Pre-syncope 04/16/2019   Sarcoidosis    Sarcoidosis    Sleep apnea    c pap   Smoker 02/12/2014   Past Surgical History:  Procedure Laterality Date   CHOLECYSTECTOMY N/A 08/25/2016   Procedure: LAPAROSCOPIC CHOLECYSTECTOMY;  Surgeon: Abigail Miyamoto, MD;  Location: [facility] OR;  Service: General;  Laterality: N/A;   TUBAL LIGATION  02/1993    reports that she quit smoking about 5 years ago. Her smoking use included cigarettes. She has a 20.00  pack-year smoking history. She has never used smokeless tobacco. She reports current alcohol use. She reports that she does not use drugs. family history includes Cancer in her mother; Colon polyps in her mother; Healthy in her daughter, sister, sister, sister, sister, son, and son; Hypertension in her mother. No Known Allergies    Outpatient Encounter Medications as of 01/26/2021  Medication Sig   acetaminophen (TYLENOL) 500 MG tablet Take 500 mg by mouth every 4 (four) hours as needed for mild pain, moderate pain or headache.    albuterol (PROVENTIL) (2.5 MG/3ML) 0.083% nebulizer solution Take 3 mLs (2.5 mg total) by nebulization every 6 (six) hours as needed for wheezing or shortness of breath.   albuterol (VENTOLIN HFA) 108 (90 Base) MCG/ACT inhaler INHALE 2 PUFFS INTO THE LUNGS EVERY 6 HOURS AS NEEDED FOR WHEEZING OR SHORTNESS OF BREATH (Patient taking differently: Inhale 1-2 puffs into the lungs every 6 (six) hours as needed for wheezing or shortness of breath.)   diltiazem (CARDIZEM) 30 MG tablet TAKE 1 TABLET(30 MG) BY MOUTH TWICE DAILY (Patient taking differently: Take 30 mg by mouth 2 (two) times daily.)   lisinopril (ZESTRIL) 2.5 MG tablet Take 2.5 mg by mouth daily.   pantoprazole (PROTONIX) 40 MG tablet Take 1 tablet (40 mg total) by mouth daily.   rosuvastatin (CRESTOR) 5 MG tablet Take 1 tablet (5 mg  total) by mouth daily.   traZODone (DESYREL) 50 MG tablet Take 50-100 mg by mouth at bedtime.   Vitamin D, Ergocalciferol, (DRISDOL) 1.25 MG (50000 UNIT) CAPS capsule Take 1 capsule (50,000 Units total) by mouth every 7 (seven) days.   XARELTO 20 MG TABS tablet TAKE 1 TABLET(20 MG) BY MOUTH DAILY WITH SUPPER (Patient taking differently: Take 20 mg by mouth daily with supper.)   No facility-administered encounter medications on file as of 01/26/2021.     REVIEW OF SYSTEMS  : All other systems reviewed and negative except where noted in the History of Present Illness.   PHYSICAL  EXAM: BP 108/70   Pulse 70   Ht 5\' 5"  (1.651 m)   Wt 214 lb 2 oz (97.1 kg)   BMI 35.63 kg/m  General: Well developed AA female in no acute distress Head: Normocephalic and atraumatic Eyes:  Sclerae anicteric, conjunctiva pink. Ears: Normal auditory acuity Lungs: Clear throughout to auscultation; no W/R/R. Heart: Regular rate and rhythm; no M/R/G. Abdomen: Soft, non-distended.  BS present.  Non-tender. Musculoskeletal: Symmetrical with no gross deformities  Skin: No lesions on visible extremities Extremities: No edema  Neurological: Alert oriented x 4, grossly non-focal Psychological:  Alert and cooperative. Normal mood and affect  ASSESSMENT AND PLAN: *Episodes of upper abdominal pain and atypical chest pain: Emergency visit ruled out cardiac cause of chest pain.  Basic labs okay, but no lipase performed.  No abdominal imaging.  She is on pantoprazole 40 mg daily.  She does not have a gallbladder.  She has not had any further symptoms in about a month now.  At this point I do not think that we need to proceed with any further testing, but certainly if her symptoms were to recur then consider cross-sectional imaging of her abdomen versus upper GI series versus increasing her PPI to twice daily.  She will call or message back with any recurrent symptoms.   CC:  , NP

## 2021-01-26 NOTE — Progress Notes (Signed)
Assessment and plan noted ?

## 2021-01-26 NOTE — Progress Notes (Signed)
Advised patient of labs of b12 and folate levels. Voiced understanding.

## 2021-01-26 NOTE — Patient Instructions (Signed)
If you are age 50 or younger, your body mass index should be between 19-25. Your Body mass index is 35.63 kg/m. If this is out of the aformentioned range listed, please consider follow up with your Primary Care Provider.   The [facility] providers would like to encourage you to use [facility] to communicate with providers for non-urgent requests or questions.  Due to long hold times on the telephone, sending your provider a message by [facility] may be faster and more efficient way to get a response. Please allow 48 business hours for a response.  Please remember that this is for non-urgent requests/questions.  Please call our office if your symptoms return.  It was great seeing you today! Thank you for entrusting me with your care and choosing [facility].  Doug Sou, PA-C

## 2021-01-26 NOTE — Telephone Encounter (Signed)
Error

## 2021-01-27 LAB — IMMUNOFIXATION, SERUM
IgA/Immunoglobulin A, Serum: 259 mg/dL (ref 87–352)
IgG (Immunoglobin G), Serum: 1278 mg/dL (ref 586–1602)
IgM (Immunoglobulin M), Srm: 54 mg/dL (ref 26–217)

## 2021-02-03 ENCOUNTER — Other Ambulatory Visit: Payer: 59

## 2021-02-09 ENCOUNTER — Inpatient Hospital Stay: Admission: RE | Admit: 2021-02-09 | Payer: 59 | Source: Ambulatory Visit

## 2021-02-15 ENCOUNTER — Ambulatory Visit (INDEPENDENT_AMBULATORY_CARE_PROVIDER_SITE_OTHER): Payer: 59 | Admitting: Neurology

## 2021-02-15 ENCOUNTER — Other Ambulatory Visit: Payer: Self-pay

## 2021-02-15 DIAGNOSIS — R42 Dizziness and giddiness: Secondary | ICD-10-CM

## 2021-02-16 NOTE — Procedures (Signed)
ELECTROENCEPHALOGRAM REPORT  Date of Study: 02/15/2021  Patient's Name: Hailey Ross MRN: [id number] Date of Birth: 05-09-1970  Referring Provider: Bjorn Pippin, NP  Clinical History: 50 year old female with sarcoidosis, CKD and history of PE who presents for episodic dizziness  Medications: TYLENOL 500 MG tablet PROVENTIL (2.5 MG/3ML) 0.083% nebulizer solution VENTOLIN HFA 108 (90 Base) MCG/ACT inhaler CARDIZEM 30 MG tablet ZESTRIL 2.5 MG tablet PROTONIX 40 MG tablet DELTASONE 20 MG tablet CRESTOR 5 MG tablet DESYREL 50 MG tablet DRISDOL 1.25 MG (50000 UNIT) CAPS capsule XARELTO 20 MG TABS tablet  Technical Summary: A multichannel digital EEG recording measured by the international 10-20 system with electrodes applied with paste and impedances below 5000 ohms performed in our laboratory with EKG monitoring in an awake and asleep patient.  Photic stimulation was performed.  The digital EEG was referentially recorded, reformatted, and digitally filtered in a variety of bipolar and referential montages for optimal display.    Description: The patient is awake and asleep during the recording.  During maximal wakefulness, there is a symmetric, low-medium voltage 11 Hz posterior dominant rhythm that attenuates with eye opening.  The record is symmetric.  During drowsiness and sleep, there is an increase in theta slowing of the background.  Stage 2 sleep was seen.  Photic stimulation did not elicit any abnormalities.  There were no epileptiform discharges or electrographic seizures seen.    EKG lead was unremarkable.  Impression: This awake and asleep EEG is normal.    Clinical Correlation: A normal EEG does not exclude a clinical diagnosis of epilepsy.  If further clinical questions remain, prolonged EEG may be helpful.  Clinical correlation is advised.   Shon Millet, DO

## 2021-02-18 ENCOUNTER — Telehealth: Payer: Self-pay | Admitting: Family Medicine

## 2021-02-18 NOTE — Telephone Encounter (Signed)
Patient calling to find out why her stress test at [facility] was cancelled. She says she received a mychart message that it was cancelled with the reason being "patient", but states she did not cancel the appointment.

## 2021-02-19 ENCOUNTER — Encounter (HOSPITAL_COMMUNITY): Payer: 59

## 2021-02-23 ENCOUNTER — Other Ambulatory Visit: Payer: Self-pay

## 2021-02-23 ENCOUNTER — Ambulatory Visit (INDEPENDENT_AMBULATORY_CARE_PROVIDER_SITE_OTHER): Payer: 59 | Admitting: Neurology

## 2021-02-23 DIAGNOSIS — G5602 Carpal tunnel syndrome, left upper limb: Secondary | ICD-10-CM

## 2021-02-23 DIAGNOSIS — R200 Anesthesia of skin: Secondary | ICD-10-CM | POA: Diagnosis not present

## 2021-02-23 NOTE — Procedures (Signed)
[facility]  [address] [place], [address]  [place], [place] [address] Tel: [phone] Fax:  [phone] Test Date:  02/23/2021  Patient: Hailey Ross DOB: 1970-08-14 Physician: Nita Sickle, DO  Sex: Female Height: 5\' 5"  Ref Phys: , D.O.  ID#: Shon Millet   Technician:    Patient Complaints: This is a 50 year old female referred for evaluation of left hand paresthesias.  NCV & EMG Findings: Extensive electrodiagnostic testing of the left upper extremity shows:  Left mixed palmar sensory response shows prolonged latency.  Left median and ulnar sensory responses are within normal limits. The left median and ulnar motor responses are within normal limits. There is no evidence of active or chronic motor axonal loss changes affecting any of the tested muscles.  Motor unit configuration and recruitment pattern is within normal limits.  Impression: Left median neuropathy at or distal to the wrist, consistent with a clinical diagnosis of carpal tunnel syndrome.  Overall, these findings are very mild in degree electrically.   ___________________________ 44, DO    Nerve Conduction Studies Anti Sensory Summary Table   Stim Site NR Peak (ms) Norm Peak (ms) P-T Amp (V) Norm P-T Amp  Left Median Anti Sensory (2nd Digit)  34C  Wrist    3.6 <3.6 30.9 >15  Left Ulnar Anti Sensory (5th Digit)  34C  Wrist    2.7 <3.1 20.9 >10   Motor Summary Table   Stim Site NR Onset (ms) Norm Onset (ms) O-P Amp (mV) Norm O-P Amp Site1 Site2 Delta-0 (ms) Dist (cm) Vel (m/s) Norm Vel (m/s)  Left Median Motor (Abd Poll Brev)  34C  Wrist    3.4 <4.0 8.3 >6 Elbow Wrist 4.5 27.0 60 >50  Elbow    7.9  7.8         Left Ulnar Motor (Abd Dig Minimi)  34C  Wrist    2.1 <3.1 9.3 >7 B Elbow Wrist 4.2 22.0 52 >50  B Elbow    6.3  9.3  A Elbow B Elbow 1.9 10.0 53 >50  A Elbow    8.2  9.2          Comparison Summary Table   Stim Site NR Peak (ms) Norm Peak (ms) P-T Amp (V)  Site1 Site2 Delta-P (ms) Norm Delta (ms)  Left Median/Ulnar Palm Comparison (Wrist - 8cm)  34C  Median Palm    2.0 <2.2 38.6 Median Palm Ulnar Palm 0.6   Ulnar Palm    1.4 <2.2 14.8       EMG   Side Muscle Ins Act Fibs Psw Fasc Number Recrt Dur Dur. Amp Amp. Poly Poly. Comment  Left 1stDorInt Nml Nml Nml Nml Nml Nml Nml Nml Nml Nml Nml Nml N/A  Left Abd Poll Brev Nml Nml Nml Nml Nml Nml Nml Nml Nml Nml Nml Nml N/A  Left PronatorTeres Nml Nml Nml Nml Nml Nml Nml Nml Nml Nml Nml Nml N/A  Left Biceps Nml Nml Nml Nml Nml Nml Nml Nml Nml Nml Nml Nml N/A  Left Triceps Nml Nml Nml Nml Nml Nml Nml Nml Nml Nml Nml Nml N/A  Left Deltoid Nml Nml Nml Nml Nml Nml Nml Nml Nml Nml Nml Nml N/A      Waveforms:

## 2021-03-02 ENCOUNTER — Encounter (HOSPITAL_COMMUNITY): Payer: 59

## 2021-03-05 NOTE — Progress Notes (Signed)
Office Visit Note  Patient: Hailey Ross             Date of Birth: Mar 06, 1971           MRN: [id number]             PCP: Noreene Larsson, NP Referring: Perlie Mayo, NP Visit Date: 03/11/2021 Occupation: @GUAROCC @  Subjective:  Pain in both knees.   History of Present Illness: Hailey Ross is a 50 y.o. female with a history of sarcoidosis, positive ANA and osteoarthritis.  She states she has been having ongoing pain and discomfort in her bilateral knee joints.  In October she was evaluated by Dr. Erlinda Hong and had MRI of her left knee joint which showed some nodular synovitis and Baker's cyst.  She had a cortisone injection by Dr. Erlinda Hong which helped for short time and the pain recurred.  She has been having pain and discomfort in her right knee as well.  None of the other joints are painful.  She has occasional shortness of breath.  She had COVID-19 infection in October.  She was seen by pulmonologist after that.  She has a follow-up appointment coming up.  Activities of Daily Living:  Patient reports morning stiffness for 20 minutes.   Patient Reports nocturnal pain.  Difficulty dressing/grooming: Denies Difficulty climbing stairs: Reports Difficulty getting out of chair: Reports Difficulty using hands for taps, buttons, cutlery, and/or writing: Denies  Review of Systems  Constitutional:  Positive for fatigue. Negative for night sweats, weight gain and weight loss.  HENT:  Positive for mouth dryness. Negative for mouth sores, trouble swallowing, trouble swallowing and nose dryness.   Eyes:  Positive for dryness. Negative for pain, redness and visual disturbance.  Respiratory:  Positive for shortness of breath. Negative for cough and difficulty breathing.   Cardiovascular:  Positive for swelling in legs/feet. Negative for chest pain, palpitations, hypertension and irregular heartbeat.  Gastrointestinal:  Negative for blood in stool, constipation and diarrhea.  Endocrine: Positive  for increased urination.  Genitourinary:  Negative for difficulty urinating and vaginal dryness.  Musculoskeletal:  Positive for joint pain, joint pain, joint swelling, morning stiffness and muscle tenderness. Negative for myalgias, muscle weakness and myalgias.  Skin:  Negative for color change, rash, hair loss, skin tightness, ulcers and sensitivity to sunlight.  Allergic/Immunologic: Negative for susceptible to infections.  Neurological:  Positive for numbness. Negative for dizziness, memory loss, night sweats and weakness.  Hematological:  Negative for bruising/bleeding tendency and swollen glands.  Psychiatric/Behavioral:  Positive for depressed mood and sleep disturbance. The patient is nervous/anxious.    PMFS History:  Patient Active Problem List   Diagnosis Date Noted   Atypical chest pain 01/26/2021   Synovitis of left knee 01/19/2021   GERD (gastroesophageal reflux disease) 12/29/2020   Abdominal pain, epigastric 12/28/2020   Primary osteoarthritis of left knee 10/27/2020   Grief at loss of child 06/09/2020   Insomnia 06/09/2020   Right ankle swelling 02/05/2020   Encounter for examination following treatment at hospital 01/28/2020   Palpitations 01/28/2020   Menorrhagia with regular cycle 10/15/2019   OSA (obstructive sleep apnea) 10/08/2019   Right knee pain 08/29/2019   Positive ANA (antinuclear antibody) 08/29/2019   Essential hypertension 07/04/2019   Pain and swelling of right knee 07/04/2019   Hyperlipidemia 07/04/2019   Stage 2 chronic kidney disease 07/04/2019   Sarcoidosis of lung ([place]) 07/04/2019   Encounter for support and coordination of transition of care 07/04/2019  Daytime hypersomnolence 07/02/2019   Pulmonary hypertension ([place]) 07/02/2019   CKD (chronic kidney disease)    Bilateral pulmonary embolism ([place]) 06/18/2019   Lightheadedness 05/10/2019   Vitamin D deficiency 05/10/2019   Class 2 obesity due to excess calories with body mass index (BMI) of  37.0 to 37.9 in adult 04/16/2019   Sarcoidosis 05/04/2015   Bronchiectasis ([place]) 05/04/2015    Past Medical History:  Diagnosis Date   Acute cholecystitis 08/24/2016   Acute kidney injury superimposed on chronic kidney disease ([place]) 06/20/2019   Acute respiratory failure with hypoxia ([place]) 06/19/2019   Annual visit for general adult medical examination with abnormal findings 04/16/2019   Arthritis    right knee   Bronchiectasis ([place])    CKD (chronic kidney disease)    Iron deficiency    Lightheadedness 05/10/2019   PE (pulmonary thromboembolism) ([place]) 06/2019   Pre-syncope 04/16/2019   Sarcoidosis    Sarcoidosis    Sleep apnea    c pap   Smoker 02/12/2014    Family History  Problem Relation Age of Onset   Hypertension Mother    Cancer Mother    Colon polyps Mother    Healthy Sister    Healthy Sister    Healthy Sister    Healthy Sister    Healthy Son    Healthy Son    Healthy Daughter    Colon cancer Neg Hx    Esophageal cancer Neg Hx    Rectal cancer Neg Hx    Stomach cancer Neg Hx    Past Surgical History:  Procedure Laterality Date   CHOLECYSTECTOMY N/A 08/25/2016   Procedure: LAPAROSCOPIC CHOLECYSTECTOMY;  Surgeon: Coralie Keens, MD;  Location: [facility];  Service: General;  Laterality: N/A;   TUBAL LIGATION  02/1993   Social History   Social History Narrative   Live with partners [name]-19 years   3 children.    1-[name] (girl) 27 at home with her: 2 grandchildren in her home as well    [name]- 31 expecting   [name]-25      Enjoy: read, play games on tablet, grandkids      Diet: Veggies, does not eat a lot of fried foods, enjoys chicken and fruit   Caffeine: sodas and coffee (with sugar)   Water: Does not drink a lot at all      Wears seat beat   Does not wear sunscreen   Smoke and carbon monoxide detectors   Does not use phone while driving    Immunization History  Administered Date(s) Administered   Influenza Inj Mdck Quad Pf 12/25/2019    Influenza,inj,Quad PF,6+ Mos 12/21/2018   Influenza,inj,Quad PF,6-35 Mos 01/10/2019   PFIZER(Purple Top)SARS-COV-2 Vaccination 08/14/2019, 09/10/2019, 02/07/2020   Pneumococcal Polysaccharide-23 02/12/2014   Tdap 02/12/2014     Objective: Vital Signs: BP (!) 142/94 (BP Location: Left Arm, Patient Position: Sitting, Cuff Size: Small)   Pulse 78   Resp 12   Ht 5\' 5"  (1.651 m)   Wt 222 lb 6.4 oz (100.9 kg)   BMI 37.01 kg/m    Physical Exam Vitals and nursing note reviewed.  Constitutional:      Appearance: She is well-developed.  HENT:     Head: Normocephalic and atraumatic.  Eyes:     Conjunctiva/sclera: Conjunctivae normal.  Cardiovascular:     Rate and Rhythm: Normal rate and regular rhythm.     Heart sounds: Normal heart sounds.  Pulmonary:     Effort: Pulmonary effort is normal.  Breath sounds: Normal breath sounds.  Abdominal:     General: Bowel sounds are normal.     Palpations: Abdomen is soft.  Musculoskeletal:     Cervical back: Normal range of motion.  Lymphadenopathy:     Cervical: No cervical adenopathy.  Skin:    General: Skin is warm and dry.     Capillary Refill: Capillary refill takes less than 2 seconds.  Neurological:     Mental Status: She is alert and oriented to person, place, and time.  Psychiatric:        Behavior: Behavior normal.     Musculoskeletal Exam: C-spine was in good range of motion.  Shoulder joints, elbow joints, wrist joints, MCPs PIPs and DIPs with good range of motion with no synovitis.  She has warmth on palpation of her right knee joint.  Left knee joint was in good range of motion without any warmth or swelling.  There was no tenderness over ankles or MTPs.  CDAI Exam: CDAI Score: -- Patient Global: --; Provider Global: -- Swollen: --; Tender: -- Joint Exam 03/11/2021   No joint exam has been documented for this visit   There is currently no information documented on the homunculus. Go to the Rheumatology activity and  complete the homunculus joint exam.  Investigation: No additional findings.  Imaging: EEG adult  Result Date: 02/15/2021 Pieter Partridge, DO     02/16/2021  8:21 AM ELECTROENCEPHALOGRAM REPORT Date of Study: 02/15/2021 Patient's Name: AZAYLIAH ISABELLE MRN: [id number] Date of Birth: Nov 07, 1970 Referring Provider: Demetrius Revel, NP Clinical History: 50 year old female with sarcoidosis, CKD and history of PE who presents for episodic dizziness Medications: TYLENOL 500 MG tablet PROVENTIL (2.5 MG/3ML) 0.083% nebulizer solution VENTOLIN HFA 108 (90 Base) MCG/ACT inhaler CARDIZEM 30 MG tablet ZESTRIL 2.5 MG tablet PROTONIX 40 MG tablet DELTASONE 20 MG tablet CRESTOR 5 MG tablet DESYREL 50 MG tablet DRISDOL 1.25 MG (50000 UNIT) CAPS capsule XARELTO 20 MG TABS tablet Technical Summary: A multichannel digital EEG recording measured by the international 10-20 system with electrodes applied with paste and impedances below 5000 ohms performed in our laboratory with EKG monitoring in an awake and asleep patient.  Photic stimulation was performed.  The digital EEG was referentially recorded, reformatted, and digitally filtered in a variety of bipolar and referential montages for optimal display.  Description: The patient is awake and asleep during the recording.  During maximal wakefulness, there is a symmetric, low-medium voltage 11 Hz posterior dominant rhythm that attenuates with eye opening.  The record is symmetric.  During drowsiness and sleep, there is an increase in theta slowing of the background.  Stage 2 sleep was seen.  Photic stimulation did not elicit any abnormalities.  There were no epileptiform discharges or electrographic seizures seen.  EKG lead was unremarkable. Impression: This awake and asleep EEG is normal.  Clinical Correlation: A normal EEG does not exclude a clinical diagnosis of epilepsy.  If further clinical questions remain, prolonged EEG may be helpful.  Clinical correlation is advised. Metta Clines, DO   NCV with EMG(electromyography)  Result Date: 02/23/2021 Alda Berthold, DO     02/23/2021 11:28 AM [facility] [address], [address]  [place], [place] [address] Tel: [phone] Fax:  [phone] Test Date:  02/23/2021 Patient: Annleigh Arnstein DOB: Feb 25, 1971 Physician: Narda Amber, DO Sex: Female Height: 5\' 5"  Ref Phys: Metta Clines, D.O. ID#: [id number]   Technician:  Patient Complaints: This is a 50 year old female referred  for evaluation of left hand paresthesias. NCV & EMG Findings: Extensive electrodiagnostic testing of the left upper extremity shows: Left mixed palmar sensory response shows prolonged latency.  Left median and ulnar sensory responses are within normal limits. The left median and ulnar motor responses are within normal limits. There is no evidence of active or chronic motor axonal loss changes affecting any of the tested muscles.  Motor unit configuration and recruitment pattern is within normal limits. Impression: Left median neuropathy at or distal to the wrist, consistent with a clinical diagnosis of carpal tunnel syndrome.  Overall, these findings are very mild in degree electrically. ___________________________ Nita Sickle, DO Nerve Conduction Studies Anti Sensory Summary Table  Stim Site NR Peak (ms) Norm Peak (ms) P-T Amp (V) Norm P-T Amp Left Median Anti Sensory (2nd Digit)  34C Wrist    3.6 <3.6 30.9 >15 Left Ulnar Anti Sensory (5th Digit)  34C Wrist    2.7 <3.1 20.9 >10 Motor Summary Table  Stim Site NR Onset (ms) Norm Onset (ms) O-P Amp (mV) Norm O-P Amp Site1 Site2 Delta-0 (ms) Dist (cm) Vel (m/s) Norm Vel (m/s) Left Median Motor (Abd Poll Brev)  34C Wrist    3.4 <4.0 8.3 >6 Elbow Wrist 4.5 27.0 60 >50 Elbow    7.9  7.8        Left Ulnar Motor (Abd Dig Minimi)  34C Wrist    2.1 <3.1 9.3 >7 B Elbow Wrist 4.2 22.0 52 >50 B Elbow    6.3  9.3  A Elbow B Elbow 1.9 10.0 53 >50 A Elbow    8.2  9.2        Comparison Summary Table  Stim Site NR Peak  (ms) Norm Peak (ms) P-T Amp (V) Site1 Site2 Delta-P (ms) Norm Delta (ms) Left Median/Ulnar Palm Comparison (Wrist - 8cm)  34C Median Palm    2.0 <2.2 38.6 Median Palm Ulnar Palm 0.6  Ulnar Palm    1.4 <2.2 14.8     EMG  Side Muscle Ins Act Fibs Psw Fasc Number Recrt Dur Dur. Amp Amp. Poly Poly. Comment Left 1stDorInt Nml Nml Nml Nml Nml Nml Nml Nml Nml Nml Nml Nml N/A Left Abd Poll Brev Nml Nml Nml Nml Nml Nml Nml Nml Nml Nml Nml Nml N/A Left PronatorTeres Nml Nml Nml Nml Nml Nml Nml Nml Nml Nml Nml Nml N/A Left Biceps Nml Nml Nml Nml Nml Nml Nml Nml Nml Nml Nml Nml N/A Left Triceps Nml Nml Nml Nml Nml Nml Nml Nml Nml Nml Nml Nml N/A Left Deltoid Nml Nml Nml Nml Nml Nml Nml Nml Nml Nml Nml Nml N/A Waveforms:         Recent Labs: Lab Results  Component Value Date   WBC 11.2 (H) 12/28/2020   HGB 12.7 12/28/2020   PLT 328 12/28/2020   NA 139 12/26/2020   K 3.6 12/26/2020   CL 105 12/26/2020   CO2 25 12/26/2020   GLUCOSE 78 12/26/2020   BUN 12 12/26/2020   CREATININE 1.16 (H) 12/26/2020   BILITOT 0.6 12/23/2020   ALKPHOS 66 12/23/2020   AST 21 12/23/2020   ALT 18 12/23/2020   PROT 7.0 01/22/2021   ALBUMIN 3.6 12/23/2020   CALCIUM 9.5 12/26/2020   GFRAA 62 09/23/2020   QFTBGOLD Negative 05/01/2015   QFTBGOLDPLUS NEGATIVE 11/08/2019     Speciality Comments: No specialty comments available.  Procedures:  No procedures performed Allergies: Patient has no known allergies.   Assessment / Plan:  Visit Diagnoses: Sarcoidosis - History of pulmonary sarcoidosis which was treated by Dr. Halford Chessman in 2020 with no recurrence.  She is followed by Dr. Halford Chessman.  Last chest x-ray was on December 26, 2020 which showed chronic scarring atelectasis and fibrosis.  According the report there was no acute cardiopulmonary disease.  Positive ANA (antinuclear antibody) - Low titer positive ANA, ENA negative, anticardiolipin negative, beta-2 GP 1 negative, lupus anticoagulant negative (lupus anticoagulant was  positive once).  There is no history of oral ulcers, nasal ulcers, malar rash, raynaud's phenominon, photosensitivity or lymphadenopathy.  Primary osteoarthritis of both hands - Clinical findings consistent with osteoarthritis.  No synovitis was noted.  Trochanteric bursitis of both hips -she has intermittent discomfort.  She had mild tenderness on palpation today.  IT band stretches were advised at the last visit.  Effusion, right knee - Treated by Dr. Erlinda Hong for radial tear of the medial meniscus.  No recurrence of the effusion.  Chronic pain of left knee-she has been having increased pain and discomfort in her left knee joint.  She had MRI of her left knee joint which showed nodular synovitis and hemorrhagic Baker's cyst.  She had an injection by Dr. Erlinda Hong which was helpful.  I am uncertain if her knee joint inflammation is coming from underlying sarcoidosis or due to osteoarthritis and meniscal issues.  We have discussed use of azathioprine in the past which she declined.  I had detailed discussion regarding starting the medication again today to see if it would prevent recurrent knee joint effusion.  She also has history of pulmonary sarcoidosis.  We cannot use methotrexate due to elevated creatinine.  Patient stated that she would like to wait at this point.  She will notify me if when she is ready to start on Imuran.  TPMT was normal in July 2021.  Stage 2 chronic kidney disease-creatinine has been chronically elevated.  Bilateral pulmonary embolism ([place]) - She is on Xarelto.  Pulmonary hypertension ([facility])  Bronchiectasis without complication ([place])  Essential hypertension  Mixed hyperlipidemia  Vitamin D deficiency  Secondary hyperparathyroidism ([facility])  OSA (obstructive sleep apnea)  Orders: No orders of the defined types were placed in this encounter.  No orders of the defined types were placed in this encounter.   Follow-Up Instructions: Return in about 3 months (around 06/09/2021)  for Sarcoidosis.   Bo Merino, MD  Note - This record has been created using Editor, commissioning.  Chart creation errors have been sought, but may not always  have been located. Such creation errors do not reflect on  the standard of medical care.

## 2021-03-11 ENCOUNTER — Encounter: Payer: Self-pay | Admitting: Rheumatology

## 2021-03-11 ENCOUNTER — Other Ambulatory Visit: Payer: Self-pay

## 2021-03-11 ENCOUNTER — Ambulatory Visit (INDEPENDENT_AMBULATORY_CARE_PROVIDER_SITE_OTHER): Payer: 59 | Admitting: Rheumatology

## 2021-03-11 VITALS — BP 142/94 | HR 78 | Resp 12 | Ht 65.0 in | Wt 222.4 lb

## 2021-03-11 DIAGNOSIS — R768 Other specified abnormal immunological findings in serum: Secondary | ICD-10-CM | POA: Diagnosis not present

## 2021-03-11 DIAGNOSIS — I272 Pulmonary hypertension, unspecified: Secondary | ICD-10-CM

## 2021-03-11 DIAGNOSIS — I10 Essential (primary) hypertension: Secondary | ICD-10-CM

## 2021-03-11 DIAGNOSIS — J479 Bronchiectasis, uncomplicated: Secondary | ICD-10-CM

## 2021-03-11 DIAGNOSIS — N182 Chronic kidney disease, stage 2 (mild): Secondary | ICD-10-CM

## 2021-03-11 DIAGNOSIS — I2699 Other pulmonary embolism without acute cor pulmonale: Secondary | ICD-10-CM

## 2021-03-11 DIAGNOSIS — M7061 Trochanteric bursitis, right hip: Secondary | ICD-10-CM

## 2021-03-11 DIAGNOSIS — M19041 Primary osteoarthritis, right hand: Secondary | ICD-10-CM

## 2021-03-11 DIAGNOSIS — M25562 Pain in left knee: Secondary | ICD-10-CM

## 2021-03-11 DIAGNOSIS — G8929 Other chronic pain: Secondary | ICD-10-CM

## 2021-03-11 DIAGNOSIS — E782 Mixed hyperlipidemia: Secondary | ICD-10-CM

## 2021-03-11 DIAGNOSIS — D869 Sarcoidosis, unspecified: Secondary | ICD-10-CM

## 2021-03-11 DIAGNOSIS — M7062 Trochanteric bursitis, left hip: Secondary | ICD-10-CM

## 2021-03-11 DIAGNOSIS — M19042 Primary osteoarthritis, left hand: Secondary | ICD-10-CM

## 2021-03-11 DIAGNOSIS — N2581 Secondary hyperparathyroidism of renal origin: Secondary | ICD-10-CM

## 2021-03-11 DIAGNOSIS — E559 Vitamin D deficiency, unspecified: Secondary | ICD-10-CM

## 2021-03-11 DIAGNOSIS — G4733 Obstructive sleep apnea (adult) (pediatric): Secondary | ICD-10-CM

## 2021-03-11 DIAGNOSIS — M25461 Effusion, right knee: Secondary | ICD-10-CM

## 2021-03-14 ENCOUNTER — Ambulatory Visit: Payer: Self-pay

## 2021-03-15 ENCOUNTER — Ambulatory Visit (INDEPENDENT_AMBULATORY_CARE_PROVIDER_SITE_OTHER): Payer: 59

## 2021-03-15 ENCOUNTER — Encounter: Payer: Self-pay | Admitting: Adult Health

## 2021-03-15 ENCOUNTER — Ambulatory Visit (INDEPENDENT_AMBULATORY_CARE_PROVIDER_SITE_OTHER): Payer: 59 | Admitting: Adult Health

## 2021-03-15 ENCOUNTER — Other Ambulatory Visit: Payer: Self-pay

## 2021-03-15 ENCOUNTER — Telehealth: Payer: Self-pay | Admitting: Adult Health

## 2021-03-15 VITALS — BP 112/84 | HR 93 | Temp 98.3°F | Ht 65.0 in | Wt 218.8 lb

## 2021-03-15 DIAGNOSIS — J479 Bronchiectasis, uncomplicated: Secondary | ICD-10-CM

## 2021-03-15 DIAGNOSIS — G4733 Obstructive sleep apnea (adult) (pediatric): Secondary | ICD-10-CM

## 2021-03-15 DIAGNOSIS — D860 Sarcoidosis of lung: Secondary | ICD-10-CM

## 2021-03-15 DIAGNOSIS — D869 Sarcoidosis, unspecified: Secondary | ICD-10-CM | POA: Diagnosis not present

## 2021-03-15 MED ORDER — PREDNISONE 20 MG PO TABS: 20.0000 mg | ORAL_TABLET | Freq: Every day | ORAL | 0 refills | Status: DC

## 2021-03-15 NOTE — Patient Instructions (Addendum)
Prednisone 20mg  daily for 5 days, take with food.  Continue on Xarelto 20mg  daily.  Avoid NSAID meds , (advil, motrin, etc)  Continue on Flutter valve for cough/congestion .  Advance activity as tolerated.  Try to wear CPAP each night .  Saline nasal spray and gel As needed   Wear for at least 4-6 hrs or more each night .  Do not drive if sleepy .  Call if oxygen level continue to be below 88% or worsen.  Follow up in 5 days  with Dr. or Lilyth Lawyer NP and As needed   Please contact office for sooner follow up if symptoms do not improve or worsen or seek emergency care

## 2021-03-15 NOTE — Assessment & Plan Note (Signed)
Continue on CPAP At bedtime   

## 2021-03-15 NOTE — Assessment & Plan Note (Addendum)
Possible flare - Chest xray w/ no acute process.  Exam unrevealing except for mild desats, has acrylic nails suspect home readings are not accurate.  Forehead probe in office with O2 sats 98%.  Close follow up to recheck O2 sats   Plan  Patient Instructions  Prednisone 20mg  daily for 5 days, take with food.  Continue on Xarelto 20mg  daily.  Avoid NSAID meds , (advil, motrin, etc)  Continue on Flutter valve for cough/congestion .  Advance activity as tolerated.  Try to wear CPAP each night .  Saline nasal spray and gel As needed   Wear for at least 4-6 hrs or more each night .  Do not drive if sleepy .  Call if oxygen level continue to be below 88% or worsen.  Follow up in 5 days  with Dr. or Venise Ellingwood NP and As needed   Please contact office for sooner follow up if symptoms do not improve or worsen or seek emergency care

## 2021-03-15 NOTE — Progress Notes (Signed)
_0  ID: Jacinto Reap, female    DOB: 04/28/1970, 50 y.o.   MRN: [id number]  Chief Complaint  Patient presents with   Acute Visit    Referring provider: Noreene Larsson, NP  HPI: 50 year old female followed for pulmonary sarcoidosis, bronchiectasis, sleep apnea.  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 Medical history significant for chronic kidney disease and hypertension  TEST/EVENTS :  Labs 04/21/15 >> HIV negative, Quantiferon gold negative, ACE 195, ANA, RF negative, ESR 32      Chest imaging:  CT chest 03/18/15 >> biapical thickening with traction BTX, BTX RML and lingula, subcarinal LAN 1.7 cm, patchy increased interstitial markings   HRCT chest 03/07/18 >> borderline LAN, patchy GGO, septal thickening, architectural distortion, cylindrical and varicose BTX   CT chest June 18, 2019 showed peripheral segmental and subsegmental bilateral pulmonary emboli, bulky mediastinal and hilar lymph nodes, bilateral apical and lingular honeycombing and bilateral upper lobe mild traction bronchiectasis.  Nonspecific groundglass opacities in the left base and right middle lobe.  Not significantly changed   Venous Dopplers were negative for DVT.    2D echo showed a preserved EF.  Mildly reduced right ventricular systolic function and moderately elevated pulmonary artery systolic pressure at 57 mmHg.  Moderate grade 3 protruding plaque involving the distal aortic arch and proximal descending aorta   Echo 09/2019    Home sleep study was done July 22, 2019.  This showed mild sleep apnea with an AHI at 9.5/hour and SPO2 low at 74%   PFT  PFT 05/26/15 >> FEV1 1.67 (66%), FEV1% 88, TLC 2/78 (53%), DLCO 36%   09/26/2019-pulmonary function test-FEV1 54%, ratio 91, FVC 47%, DLCO 45%    03/15/2021 Acute OV : Sarcoid  Patient presents for an acute office visit.  Patient has underlying sarcoidosis and bronchiectasis.  History of PE on lifelong anticoagulation  therapy.  Patient complains about 4 days ago started to notice she was getting more tired with walking , O2 sats 85-88% at home. Would improve with rest . Says otherwise she feels good, no cough, fever, edema or discolored mucus. No body aches. Grandkids are sick with viral illnesses . Today in office O2 sats recheck with forehead probe 98% on room air at rest . Walk showed o2 sats 88% on room air.  She says she remains active. No chest pain or orthopnea. Had labs last week reported as stable .  Chest xray today with chronic changes no sign of pneumonia   Had COVID-19 infection in September 2022.  Was given antiviral pack. Fully recovered.   Has underlying sleep apnea on nocturnal CPAP.  Patient says overall she is doing well with no issues.   Has been under a lot of stress this past year.  Her daughter was killed in home invasion and she is raising 2 grandchildren.    Hx of PE ,denies hemoptysis. Endorces Xarelto compliance .  No Known Allergies  Immunization History  Administered Date(s) Administered   Influenza Inj Mdck Quad Pf 12/25/2019   Influenza,inj,Quad PF,6+ Mos 12/21/2018, 02/22/2021   Influenza,inj,Quad PF,6-35 Mos 01/10/2019   PFIZER(Purple Top)SARS-COV-2 Vaccination 08/14/2019, 09/10/2019, 02/07/2020   Pneumococcal Polysaccharide-23 02/12/2014   Tdap 02/12/2014    Past Medical History:  Diagnosis Date   Acute cholecystitis 08/24/2016   Acute kidney injury superimposed on chronic kidney disease ([place]) 06/20/2019   Acute respiratory failure with hypoxia ([place]) 06/19/2019   Annual visit for general adult medical examination with abnormal findings 04/16/2019  Arthritis    right knee   Bronchiectasis (H[facility])    CKD (chronic kidney disease)    Iron deficiency    Lightheadedness 05/10/2019   PE (pulmonary thromboembolism) ([place] ) 06/2019   Pre-syncope 04/16/2019   Sarcoidosis    Sarcoidosis    Sleep apnea    c pap   Smoker 02/12/2014    Tobacco History: Social History    Tobacco Use  Smoking Status Former   Packs/day: 1.00   Years: 20.00   Pack years: 20.00   Types: Cigarettes   Quit date: 03/21/2015   Years since quitting: 5.9  Smokeless Tobacco Never   Counseling given: Not Answered   Outpatient Medications Prior to Visit  Medication Sig Dispense Refill   acetaminophen (TYLENOL) 500 MG tablet Take 500 mg by mouth every 4 (four) hours as needed for mild pain, moderate pain or headache.      albuterol (PROVENTIL) (2.5 MG/3ML) 0.083% nebulizer solution Take 3 mLs (2.5 mg total) by nebulization every 6 (six) hours as needed for wheezing or shortness of breath. 75 mL 1   albuterol (VENTOLIN HFA) 108 (90 Base) MCG/ACT inhaler INHALE 2 PUFFS INTO THE LUNGS EVERY 6 HOURS AS NEEDED FOR WHEEZING OR SHORTNESS OF BREATH (Patient taking differently: Inhale 1-2 puffs into the lungs every 6 (six) hours as needed for wheezing or shortness of breath.) 6.7 g 5   diltiazem (CARDIZEM) 30 MG tablet TAKE 1 TABLET(30 MG) BY MOUTH TWICE DAILY (Patient taking differently: Take 30 mg by mouth 2 (two) times daily.) 180 tablet 3   lisinopril (ZESTRIL) 2.5 MG tablet Take 2.5 mg by mouth daily.     LORazepam (ATIVAN) 0.5 MG tablet Take 0.5 mg by mouth daily as needed.     pantoprazole (PROTONIX) 40 MG tablet Take 1 tablet (40 mg total) by mouth daily. 90 tablet 3   prazosin (MINIPRESS) 1 MG capsule Take 1 mg by mouth 2 (two) times daily.     rosuvastatin (CRESTOR) 5 MG tablet Take 1 tablet (5 mg total) by mouth daily. 90 tablet 1   traZODone (DESYREL) 50 MG tablet Take 50-100 mg by mouth at bedtime.     Vitamin D, Ergocalciferol, (DRISDOL) 1.25 MG (50000 UNIT) CAPS capsule Take 1 capsule (50,000 Units total) by mouth every 7 (seven) days. 12 capsule 1   XARELTO 20 MG TABS tablet TAKE 1 TABLET(20 MG) BY MOUTH DAILY WITH SUPPER (Patient taking differently: Take 20 mg by mouth daily with supper.) 90 tablet 2   No facility-administered medications prior to visit.     Review of  Systems:   Constitutional:   No  weight loss, night sweats,  Fevers, chills, fatigue, or  lassitude.  HEENT:   No headaches,  Difficulty swallowing,  Tooth/dental problems, or  Sore throat,                No sneezing, itching, ear ache, nasal congestion, post nasal drip,   CV:  No chest pain,  Orthopnea, PND, swelling in lower extremities, anasarca, dizziness, palpitations, syncope.   GI  No heartburn, indigestion, abdominal pain, nausea, vomiting, diarrhea, change in bowel habits, loss of appetite, bloody stools.   Resp:   No excess mucus, no productive cough,  No non-productive cough,  No coughing up of blood.  No change in color of mucus.  No wheezing.  No chest wall deformity  Skin: no rash or lesions.  GU: no dysuria, change in color of urine, no urgency or frequency.  No flank pain,  no hematuria   MS:  No joint pain or swelling.  No decreased range of motion.  No back pain.    Physical Exam  BP 112/84 (BP Location: Left Arm, Patient Position: Sitting, Cuff Size: Large)   Pulse 93   Temp 98.3 F (36.8 C) (Oral)   Ht _0  (1.651 m)   Wt 218 lb 12.8 oz (99.2 kg)   SpO2 (!) 85% Comment: RA, placed on 2L, up to 97%  BMI 36.41 kg/m   GEN: A/Ox3; pleasant , NAD, well nourished    HEENT:  [place]/AT,   NOSE-clear, THROAT-clear, no lesions, no postnasal drip or exudate noted.   NECK:  Supple w/ fair ROM; no JVD; normal carotid impulses w/o bruits; no thyromegaly or nodules palpated; no lymphadenopathy.    RESP  Clear  P & A; w/o, wheezes/ rales/ or rhonchi. no accessory muscle use, no dullness to percussion  CARD:  RRR, no m/r/g, no peripheral edema, pulses intact, no cyanosis or clubbing.  GI:   Soft & nt; nml bowel sounds; no organomegaly or masses detected.   Musco: Warm bil, no deformities or joint swelling noted.   Neuro: alert, no focal deficits noted.    Skin: Warm, no lesions or rashes    Lab Results:  CBC    BNP No results found for: BNP  ProBNP No  results found for: PROBNP    PFT Results Latest Ref Rng & Units 09/26/2019 05/26/2015  FVC-Pre L - 1.78  FVC-Predicted Pre % 47 57  FVC-Post L 1.39 1.88  FVC-Predicted Post % 45 60  Pre FEV1/FVC % % 91 85  Post FEV1/FCV % % 85 88  FEV1-Pre L 1.33 1.51  FEV1-Predicted Pre % 54 59  FEV1-Post L 1.18 1.67  DLCO uncorrected ml/min/mmHg 9.96 9.41  DLCO UNC% % 45 36  DLCO corrected ml/min/mmHg 10.18 9.38  DLCO COR %Predicted % 46 36  DLVA Predicted % 105 73  TLC L 3.17 2.78  TLC % Predicted % 61 53  RV % Predicted % 89 44    No results found for: NITRICOXIDE      Assessment & Plan:   Sarcoidosis Possible flare - Chest xray w/ no acute process.  Exam unrevealing except for mild desats, has acrylic nails suspect home readings are not accurate.  Forehead probe in office with O2 sats 98%.  Close follow up to recheck O2 sats   Plan  Patient Instructions  Prednisone 45m daily for 5 days, take with food.  Continue on Xarelto 262mdaily.  Avoid NSAID meds , (advil, motrin, etc)  Continue on Flutter valve for cough/congestion .  Advance activity as tolerated.  Try to wear CPAP each night .  Saline nasal spray and gel As needed   Wear for at least 4-6 hrs or more each night .  Do not drive if sleepy .  Call if oxygen level continue to be below 88% or worsen.  Follow up in 5 days  with Dr. SoHalford Chessmanr Finlee Milo NP and As needed   Please contact office for sooner follow up if symptoms do not improve or worsen or seek emergency care        OSA (obstructive sleep apnea) Continue on CPAP At bedtime       TaRexene EdisonNP 03/15/2021

## 2021-03-15 NOTE — Telephone Encounter (Signed)
I called the patient and she denies any chest pain, shortness of breath and she only has clear when she is coughing.   She has reported that she is having some dropping in oxygen in upper 70's like around 78%. She does not wear oxygen and she has reported that she is staying around 88% today. She has a OV with provider Rubye Oaks NP this afternoon. Nothing further needed.

## 2021-03-16 NOTE — Telephone Encounter (Signed)
Please call if develops symptoms  Please contact office for sooner follow up if symptoms do not improve or worsen or seek emergency care

## 2021-03-16 NOTE — Telephone Encounter (Signed)
Received the following MyChart message from patient:   "Hi I just wanted to make Tammy aware that I found out this morning my grandson tested positive for the flu. We spoke about this on yesterday and wanted to make y'all aware of those results for him. So I'm sure I've been exposed but not having any symptoms as of yet!!"  Will route to TP so she is aware.

## 2021-03-16 NOTE — Progress Notes (Signed)
Reviewed and agree with assessment/plan.   Mikeal Winstanley, MD [facility] 03/16/2021, 10:44 AM Pager:  [phone]  

## 2021-03-22 ENCOUNTER — Other Ambulatory Visit: Payer: Self-pay | Admitting: *Deleted

## 2021-03-22 ENCOUNTER — Ambulatory Visit (INDEPENDENT_AMBULATORY_CARE_PROVIDER_SITE_OTHER): Payer: 59 | Admitting: Adult Health

## 2021-03-22 ENCOUNTER — Encounter: Payer: Self-pay | Admitting: Adult Health

## 2021-03-22 ENCOUNTER — Other Ambulatory Visit: Payer: Self-pay

## 2021-03-22 DIAGNOSIS — G4733 Obstructive sleep apnea (adult) (pediatric): Secondary | ICD-10-CM

## 2021-03-22 DIAGNOSIS — D869 Sarcoidosis, unspecified: Secondary | ICD-10-CM

## 2021-03-22 DIAGNOSIS — I2699 Other pulmonary embolism without acute cor pulmonale: Secondary | ICD-10-CM | POA: Diagnosis not present

## 2021-03-22 NOTE — Assessment & Plan Note (Signed)
History of unprovoked PE-patient is continue on Xarelto.

## 2021-03-22 NOTE — Patient Instructions (Addendum)
Continue on Xarelto 20mg  daily.  Avoid NSAID meds , (advil, motrin, etc)  Continue on Flutter valve for cough/congestion .  Albuterol inhaler As needed   Advance activity as tolerated.  Wear CPAP each night .  Order SD card for CPAP .  Saline nasal spray and gel As needed   Wear for at least 4-6 hrs or more each night .  Do not drive if sleepy .  Covid booster in January 2023  Follow up with Dr. February 2023  in 3 months in [place] office and As needed   Please contact office for sooner follow up if symptoms do not improve or worsen or seek emergency care

## 2021-03-22 NOTE — Progress Notes (Signed)
amb  

## 2021-03-22 NOTE — Assessment & Plan Note (Signed)
Recent exacerbation now improved after steroid burst. Patient education regarding yearly eye exams, routine lab monitoring for glucose and LFTs  Need to watch flare , on ACE which may increase cough flares   Plan  Patient Instructions  Continue on Xarelto 20mg  daily.  Avoid NSAID meds , (advil, motrin, etc)  Continue on Flutter valve for cough/congestion .  Albuterol inhaler As needed   Advance activity as tolerated.  Wear CPAP each night .  Order SD card for CPAP .  Saline nasal spray and gel As needed   Wear for at least 4-6 hrs or more each night .  Do not drive if sleepy .  Covid booster in January 2023  Follow up with Dr. February 2023  in 3 months in [place] office and As needed   Please contact office for sooner follow up if symptoms do not improve or worsen or seek emergency care

## 2021-03-22 NOTE — Progress Notes (Signed)
Reviewed and agree with assessment/plan.   Coralyn Helling, MD [facility] 03/22/2021, 3:11 PM Pager:  [phone]

## 2021-03-22 NOTE — Progress Notes (Signed)
 @Patient ID: Hailey Ross, female    DOB: 03/03/1971, 50 y.o.   MRN: [id number]  Chief Complaint  Patient presents with   Follow-up    Referring provider: Gray, Joseph M, NP  HPI: 50-year-old female followed for pulmonary sarcoidosis, bronchiectasis and sleep apnea. History of unprovoked PE in March 2021 on lifelong anticoagulation Medical history significant for chronic kidney disease and hypertension  TEST/EVENTS :  Labs 04/21/15 >> HIV negative, Quantiferon gold negative, ACE 195, ANA, RF negative, ESR 32     Chest imaging:  CT chest 03/18/15 >> biapical thickening with traction BTX, BTX RML and lingula, subcarinal LAN 1.7 cm, patchy increased interstitial markings   HRCT chest 03/07/18 >> borderline LAN, patchy GGO, septal thickening, architectural distortion, cylindrical and varicose BTX   CT chest June 18, 2019 showed peripheral segmental and subsegmental bilateral pulmonary emboli, bulky mediastinal and hilar lymph nodes, bilateral apical and lingular honeycombing and bilateral upper lobe mild traction bronchiectasis.  Nonspecific groundglass opacities in the left base and right middle lobe.  Not significantly changed   Venous Dopplers were negative for DVT.     2D echo showed a preserved EF.  Mildly reduced right ventricular systolic function and moderately elevated pulmonary artery systolic pressure at 57 mmHg.  Moderate grade 3 protruding plaque involving the distal aortic arch and proximal descending aorta   Echo 09/2019    Home sleep study was done July 22, 2019.  This showed mild sleep apnea with an AHI at 9.5/hour and SPO2 low at 74%   PFT  PFT 05/26/15 >> FEV1 1.67 (66%), FEV1% 88, TLC 2/78 (53%), DLCO 36%   09/26/2019-pulmonary function test-FEV1 54%, ratio 91, FVC 47%, DLCO 45%  03/22/2021 Follow up : Sarcoid  Patient returns for a 1 week follow-up.  Patient has underlying sarcoidosis and bronchiectasis.  She also has a history of unprovoked PE on lifelong  anticoagulation therapy.  She endorses compliance with Xarelto.  She was seen last visit with a sarcoid flare.  Had been exposed to possible viral illness with her grandchildren.  Chest x-ray showed no sign of pneumonia and stable chronic changes. Patient was given a prednisone burst.  Patient says that she is feeling bette and feels she is getting back to her baseline.  Has decreased cough and shortness of breath. Feels she is a lot better. Was able to dance at party this weekend.   Patient has underlying sleep apnea.  Is on CPAP. Unable to get download , due to lack of SD card. Wears each night for 6 hr . Feels it helps and is more rested with CPAP   Flu shot utd.  Covid Infection in September. Discussed booster for January 2023.       No Known Allergies  Immunization History  Administered Date(s) Administered   Influenza Inj Mdck Quad Pf 12/25/2019   Influenza,inj,Quad PF,6+ Mos 12/21/2018, 02/22/2021   Influenza,inj,Quad PF,6-35 Mos 01/10/2019   PFIZER(Purple Top)SARS-COV-2 Vaccination 08/14/2019, 09/10/2019, 02/07/2020   Pneumococcal Polysaccharide-23 02/12/2014   Tdap 02/12/2014    Past Medical History:  Diagnosis Date   Acute cholecystitis 08/24/2016   Acute kidney injury superimposed on chronic kidney disease ([facility]) 06/20/2019   Acute respiratory failure with hypoxia ([facility]) 06/19/2019   Annual visit for general adult medical examination with abnormal findings 04/16/2019   Arthritis    right knee   Bronchiectasis (HCC)    CKD (chronic kidney disease)    Iron deficiency    Lightheadedness 05/10/2019   PE (pulmonary thromboembolism) (  [place]) 06/2019   Pre-syncope 04/16/2019   Sarcoidosis    Sarcoidosis    Sleep apnea    c pap   Smoker 02/12/2014    Tobacco History: Social History   Tobacco Use  Smoking Status Former   Packs/day: 1.00   Years: 20.00   Pack years: 20.00   Types: Cigarettes   Quit date: 03/21/2015   Years since quitting: 6.0  Smokeless Tobacco Never    Counseling given: Not Answered   Outpatient Medications Prior to Visit  Medication Sig Dispense Refill   acetaminophen (TYLENOL) 500 MG tablet Take 500 mg by mouth every 4 (four) hours as needed for mild pain, moderate pain or headache.      albuterol (PROVENTIL) (2.5 MG/3ML) 0.083% nebulizer solution Take 3 mLs (2.5 mg total) by nebulization every 6 (six) hours as needed for wheezing or shortness of breath. 75 mL 1   albuterol (VENTOLIN HFA) 108 (90 Base) MCG/ACT inhaler INHALE 2 PUFFS INTO THE LUNGS EVERY 6 HOURS AS NEEDED FOR WHEEZING OR SHORTNESS OF BREATH (Patient taking differently: Inhale 1-2 puffs into the lungs every 6 (six) hours as needed for wheezing or shortness of breath.) 6.7 g 5   diltiazem (CARDIZEM) 30 MG tablet TAKE 1 TABLET(30 MG) BY MOUTH TWICE DAILY (Patient taking differently: Take 30 mg by mouth 2 (two) times daily.) 180 tablet 3   lisinopril (ZESTRIL) 2.5 MG tablet Take 2.5 mg by mouth daily.     LORazepam (ATIVAN) 0.5 MG tablet Take 0.5 mg by mouth daily as needed.     pantoprazole (PROTONIX) 40 MG tablet Take 1 tablet (40 mg total) by mouth daily. 90 tablet 3   prazosin (MINIPRESS) 1 MG capsule Take 1 mg by mouth 2 (two) times daily.     rosuvastatin (CRESTOR) 5 MG tablet Take 1 tablet (5 mg total) by mouth daily. 90 tablet 1   traZODone (DESYREL) 50 MG tablet Take 50-100 mg by mouth at bedtime.     Vitamin D, Ergocalciferol, (DRISDOL) 1.25 MG (50000 UNIT) CAPS capsule Take 1 capsule (50,000 Units total) by mouth every 7 (seven) days. 12 capsule 1   XARELTO 20 MG TABS tablet TAKE 1 TABLET(20 MG) BY MOUTH DAILY WITH SUPPER (Patient taking differently: Take 20 mg by mouth daily with supper.) 90 tablet 2   predniSONE (DELTASONE) 20 MG tablet Take 1 tablet (20 mg total) by mouth daily with breakfast. (Patient not taking: Reported on 03/22/2021) 5 tablet 0   No facility-administered medications prior to visit.     Review of Systems:   Constitutional:   No  weight  loss, night sweats,  Fevers, chills,  +fatigue, or  lassitude.  HEENT:   No headaches,  Difficulty swallowing,  Tooth/dental problems, or  Sore throat,                No sneezing, itching, ear ache, nasal congestion, post nasal drip,   CV:  No chest pain,  Orthopnea, PND, swelling in lower extremities, anasarca, dizziness, palpitations, syncope.   GI  No heartburn, indigestion, abdominal pain, nausea, vomiting, diarrhea, change in bowel habits, loss of appetite, bloody stools.   Resp:  No chest wall deformity  Skin: no rash or lesions.  GU: no dysuria, change in color of urine, no urgency or frequency.  No flank pain, no hematuria   MS:  No joint pain or swelling.  No decreased range of motion.  No back pain.    Physical Exam  BP 130/90 (BP Location: Left  Arm, Patient Position: Sitting, Cuff Size: Large)   Pulse 68   Temp 98.7 F (37.1 C) (Oral)   Ht 5' 5" (1.651 m)   Wt 218 lb 6.4 oz (99.1 kg)   SpO2 92%   BMI 36.34 kg/m   GEN: A/Ox3; pleasant , NAD, well nourished    HEENT:  [place]/AT, NOSE-clear, THROAT-clear, no lesions, no postnasal drip or exudate noted.   NECK:  Supple w/ fair ROM; no JVD; normal carotid impulses w/o bruits; no thyromegaly or nodules palpated; no lymphadenopathy.    RESP  Clear  P & A; w/o, wheezes/ rales/ or rhonchi. no accessory muscle use, no dullness to percussion  CARD:  RRR, no m/r/g, no peripheral edema, pulses intact, no cyanosis or clubbing.  GI:   Soft & nt; nml bowel sounds; no organomegaly or masses detected.   Musco: Warm bil, no deformities or joint swelling noted.   Neuro: alert, no focal deficits noted.    Skin: Warm, no lesions or rashes    Lab Results:  CBC    BNP No results found for: BNP  ProBNP No results found for: PROBNP  Imaging:     PFT Results Latest Ref Rng & Units 09/26/2019 05/26/2015  FVC-Pre L - 1.78  FVC-Predicted Pre % 47 57  FVC-Post L 1.39 1.88  FVC-Predicted Post % 45 60  Pre FEV1/FVC % %  91 85  Post FEV1/FCV % % 85 88  FEV1-Pre L 1.33 1.51  FEV1-Predicted Pre % 54 59  FEV1-Post L 1.18 1.67  DLCO uncorrected ml/min/mmHg 9.96 9.41  DLCO UNC% % 45 36  DLCO corrected ml/min/mmHg 10.18 9.38  DLCO COR %Predicted % 46 36  DLVA Predicted % 105 73  TLC L 3.17 2.78  TLC % Predicted % 61 53  RV % Predicted % 89 44    No results found for: NITRICOXIDE      Assessment & Plan:   Sarcoidosis Recent exacerbation now improved after steroid burst. Patient education regarding yearly eye exams, routine lab monitoring for glucose and LFTs  Need to watch flare , on ACE which may increase cough flares   Plan  Patient Instructions  Continue on Xarelto 84m daily.  Avoid NSAID meds , (advil, motrin, etc)  Continue on Flutter valve for cough/congestion .  Albuterol inhaler As needed   Advance activity as tolerated.  Wear CPAP each night .  Order SD card for CPAP .  Saline nasal spray and gel As needed   Wear for at least 4-6 hrs or more each night .  Do not drive if sleepy .  Covid booster in January 2023  Follow up with Dr. SHalford Chessman [place] and As needed   Please contact office for sooner follow up if symptoms do not improve or worsen or seek emergency care          OSA (obstructive sleep apnea) Continue on CPAP at bedtime  [place]ilateral pulmonary embolism ([place] History of unprovoked PE-patient is continue on Xarelto.     TRexene Edison NP 03/22/2021

## 2021-03-22 NOTE — Assessment & Plan Note (Signed)
Continue on CPAP at bedtime 

## 2021-03-31 ENCOUNTER — Encounter (HOSPITAL_COMMUNITY)
Admission: RE | Admit: 2021-03-31 | Discharge: 2021-03-31 | Disposition: A | Discharge status: Home / Self Care | Payer: 59 | Source: Ambulatory Visit | Attending: Family Medicine | Admitting: Family Medicine

## 2021-03-31 ENCOUNTER — Ambulatory Visit (HOSPITAL_COMMUNITY)
Admission: RE | Admit: 2021-03-31 | Discharge: 2021-03-31 | Disposition: A | Discharge status: Home / Self Care | Payer: 59 | Source: Ambulatory Visit | Attending: Family Medicine | Admitting: Family Medicine

## 2021-03-31 ENCOUNTER — Other Ambulatory Visit: Payer: Self-pay

## 2021-03-31 DIAGNOSIS — R079 Chest pain, unspecified: Secondary | ICD-10-CM | POA: Insufficient documentation

## 2021-03-31 LAB — NM MYOCAR MULTI W/SPECT W/WALL MOTION / EF
LV dias vol: 111 mL (ref 46–106)
LV sys vol: 37 mL
Nuc Stress EF: 67 %
Peak HR: 97 {beats}/min
RATE: 0.5
Rest HR: 56 {beats}/min
Rest Nuclear Isotope Dose: 11 mCi
SDS: 1
SRS: 2
SSS: 3
ST Depression (mm): 0 mm
Stress Nuclear Isotope Dose: 30 mCi
TID: 0.74

## 2021-03-31 MED ORDER — TECHNETIUM TC 99M TETROFOSMIN IV KIT
10.0000 | PACK | Freq: Once | INTRAVENOUS | Status: AC | PRN
  Administered 2021-03-31: 10:00:00 11 via INTRAVENOUS

## 2021-03-31 MED ORDER — SODIUM CHLORIDE FLUSH 0.9 % IV SOLN
INTRAVENOUS | Status: AC
  Administered 2021-03-31: 11:00:00 10 mL via INTRAVENOUS
  Filled 2021-03-31: qty 10

## 2021-03-31 MED ORDER — TECHNETIUM TC 99M TETROFOSMIN IV KIT
30.0000 | PACK | Freq: Once | INTRAVENOUS | Status: AC | PRN
  Administered 2021-03-31: 11:00:00 30 via INTRAVENOUS

## 2021-03-31 MED ORDER — REGADENOSON 0.4 MG/5ML IV SOLN
INTRAVENOUS | Status: AC
  Administered 2021-03-31: 11:00:00 0.4 mg via INTRAVENOUS
  Filled 2021-03-31: qty 5

## 2021-04-02 ENCOUNTER — Telehealth: Payer: Self-pay | Admitting: *Deleted

## 2021-04-02 NOTE — Telephone Encounter (Signed)
Lesle Chris, LPN  50/15/8682 12:03 PM EST Back to Top    Notified, copy to pcp.    Antoine Poche, MD  04/01/2021  4:34 PM EST     Normal stress test, no evidence of any significant blockages     Dominga Ferry MD   Jonelle Sidle, MD  03/31/2021  4:49 PM EST     Stress test ordered by Mr. Vincenza Hews NP, results forwarded to my inbox.  Ms. Wanat is a patient of Dr. Wyline Mood.  I will send to him for further review, the Myoview study was low risk.

## 2021-04-05 IMAGING — DX DG CHEST 2V
2 series · 2 of 2 positions shown · non-contrast
Comparison: Chest x-ray 06/12/2017.

CLINICAL DATA: 49-year-old female with history of sarcoidosis.
Follow-up study.

EXAM:
CHEST - 2 VIEW

[chest pa]
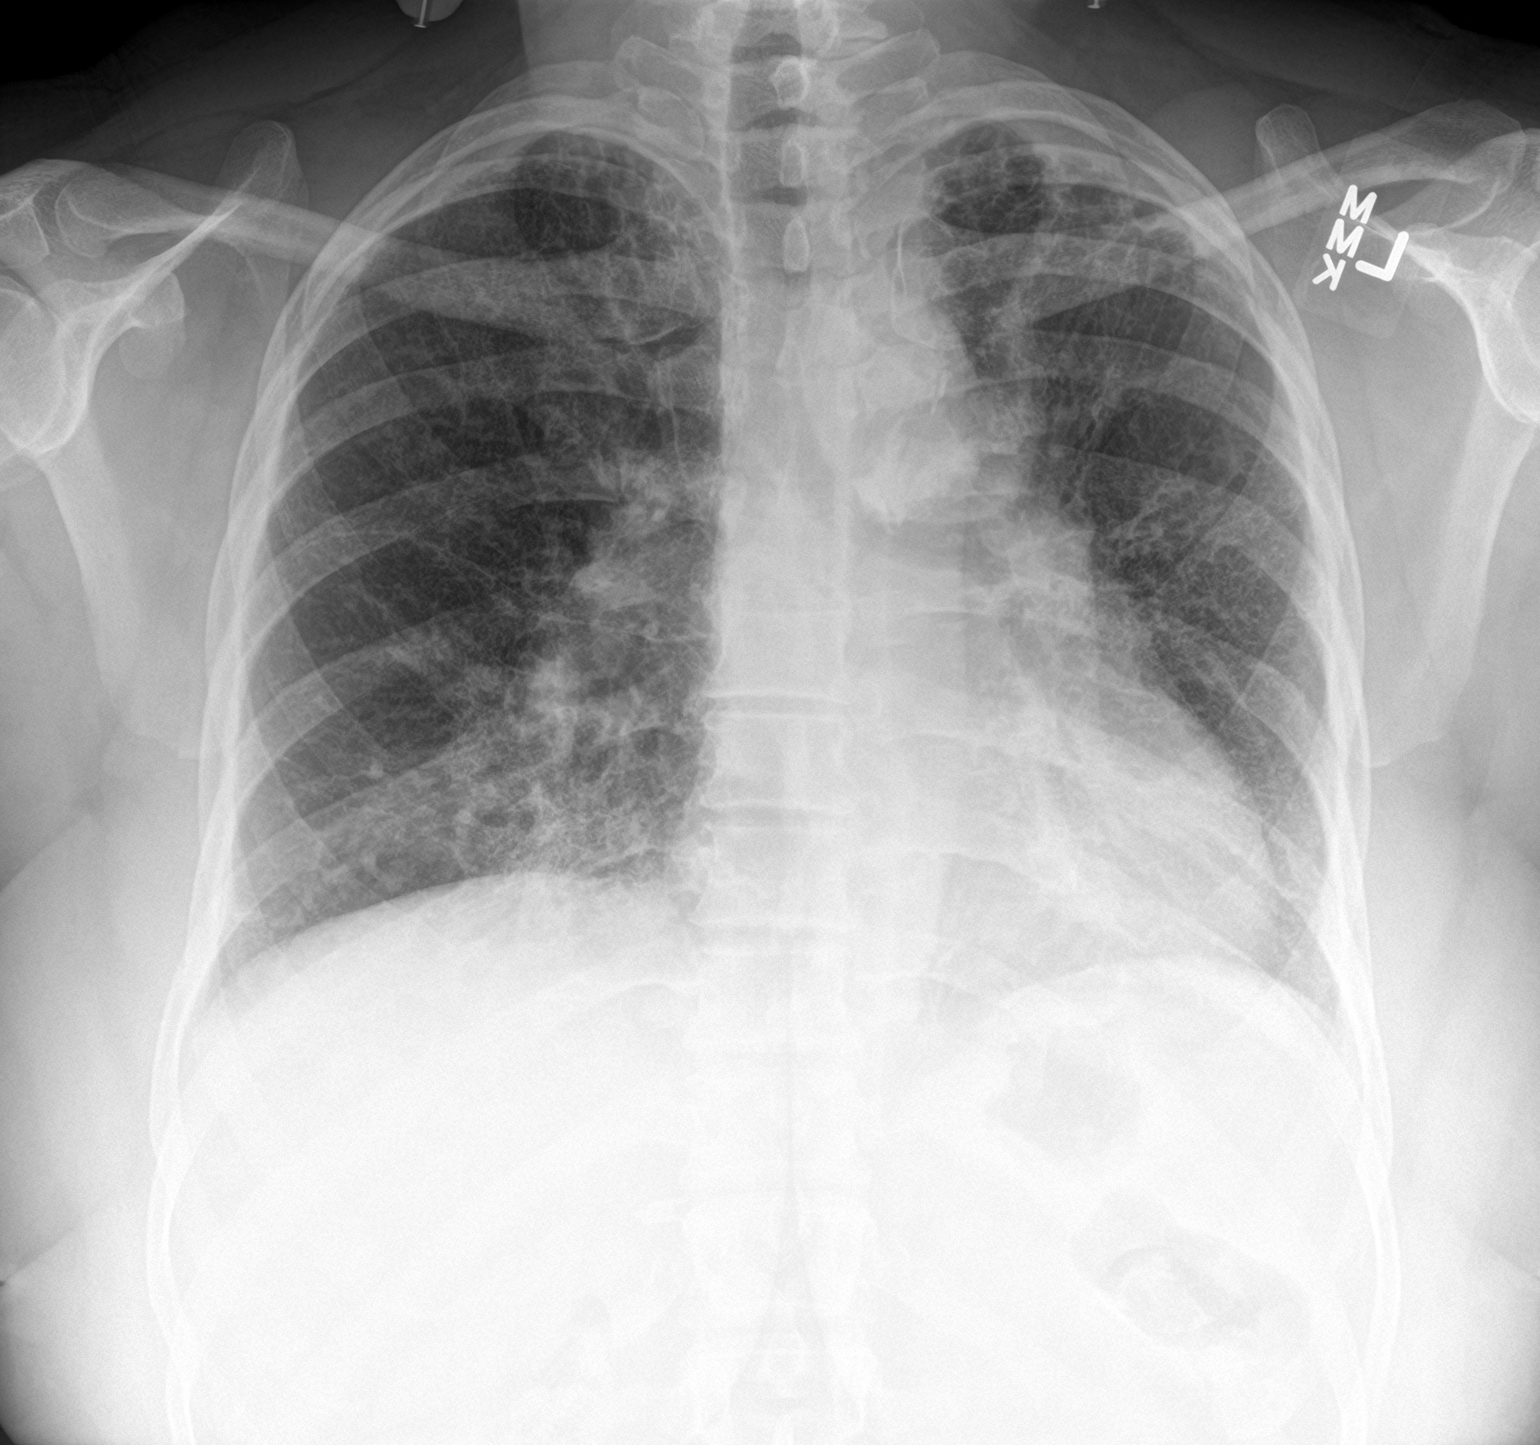

[chest lat]
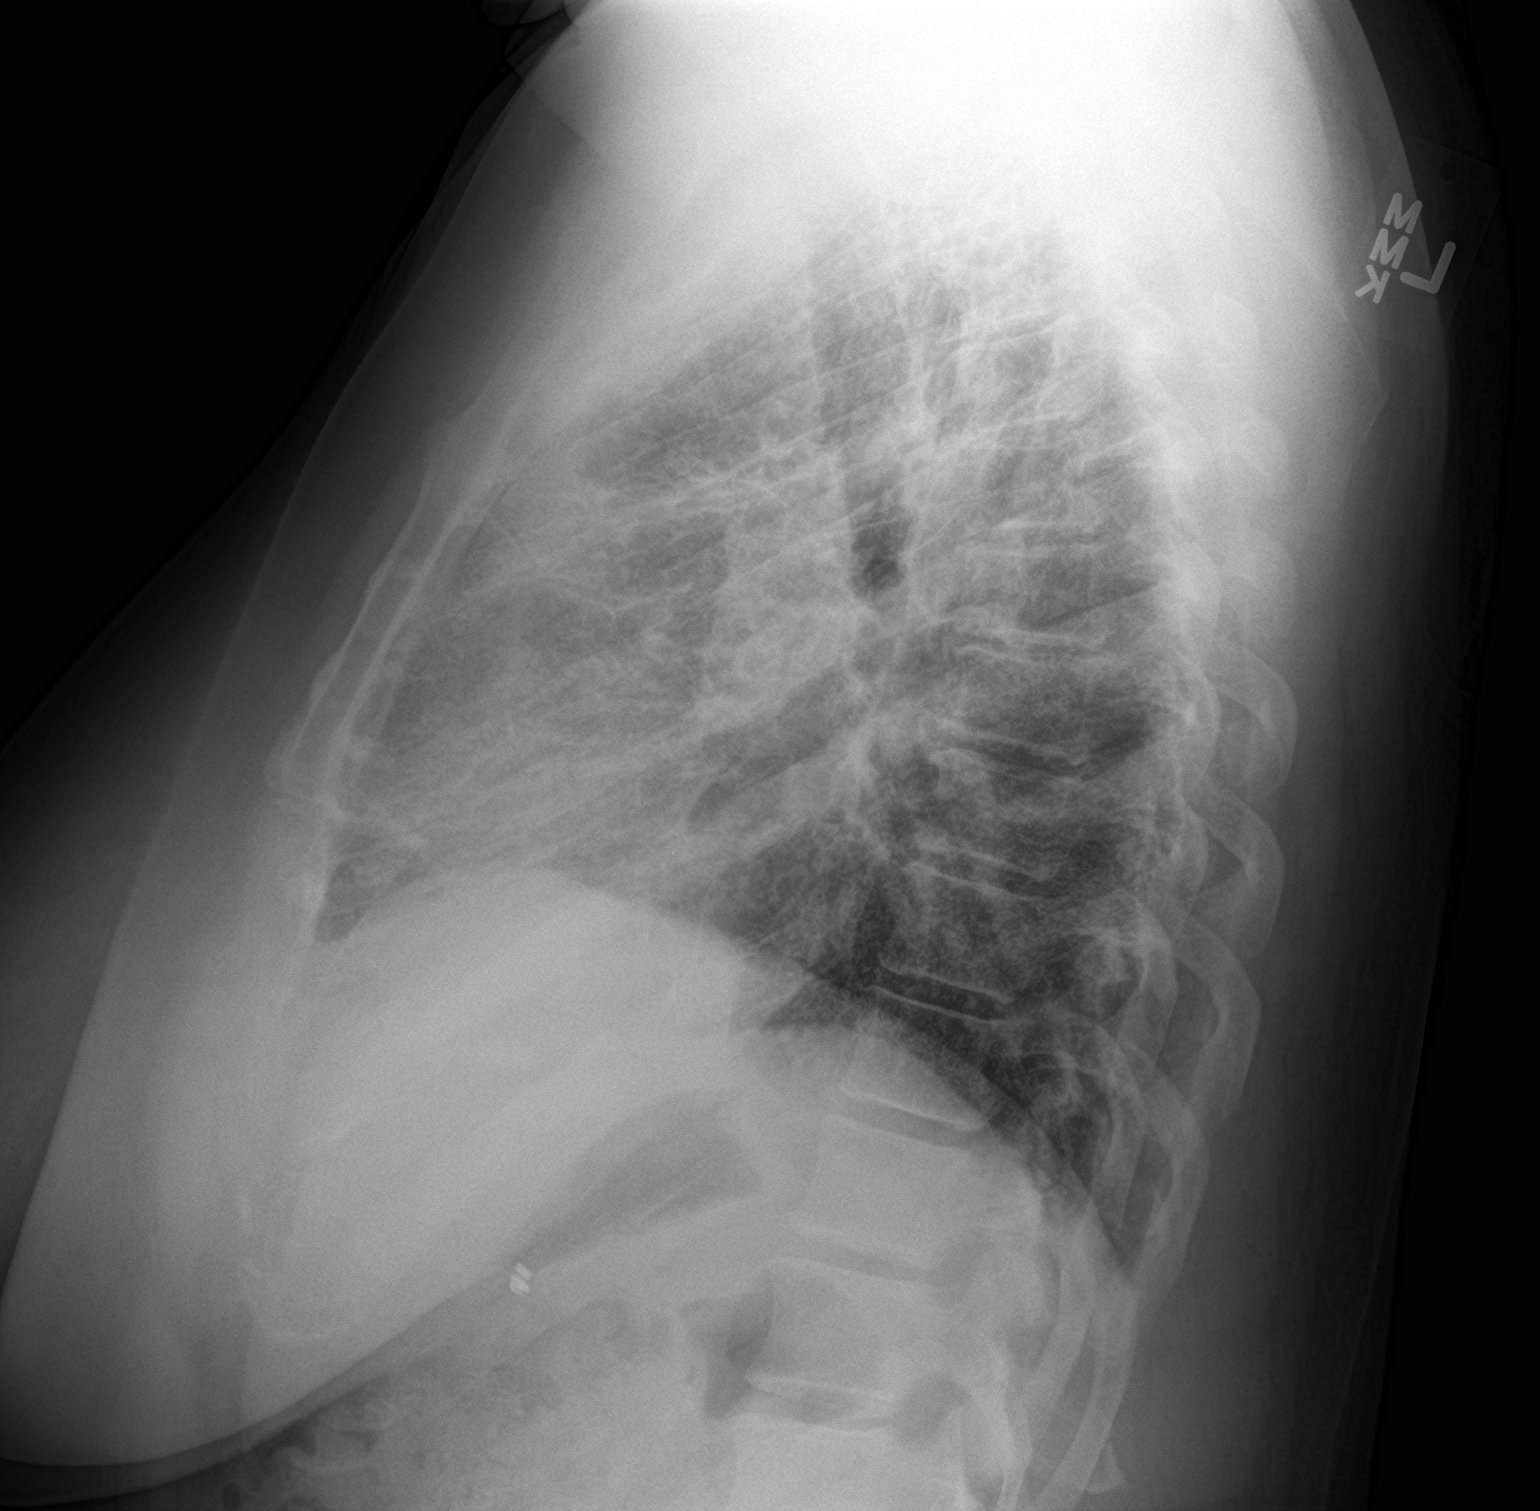

[2 of 2 positions shown; findings below may reference images not displayed]

FINDINGS: Scattered areas of asymmetrically distributed interstitial
prominence and reticulonodular opacities are again noted throughout
the lungs bilaterally, very similar to the prior study from
06/12/2017. No acute consolidative airspace disease. No pleural
effusions. No evidence of pulmonary edema. Heart size is normal.
Prominence of hilar areas bilaterally (left greater than right),
likely reflects underlying lymphadenopathy. Aortic atherosclerosis.
IMPRESSION: 1. The appearance the chest is compatible with the reported clinical
history of sarcoidosis, very similar to prior study from 06/12/2017,
as detailed above.
2. Aortic atherosclerosis.

## 2021-04-20 ENCOUNTER — Encounter: Payer: Self-pay | Admitting: Gastroenterology

## 2021-04-20 ENCOUNTER — Ambulatory Visit (INDEPENDENT_AMBULATORY_CARE_PROVIDER_SITE_OTHER): Payer: 59 | Admitting: Gastroenterology

## 2021-04-20 ENCOUNTER — Other Ambulatory Visit (INDEPENDENT_AMBULATORY_CARE_PROVIDER_SITE_OTHER): Payer: 59

## 2021-04-20 VITALS — BP 134/90 | HR 70 | Ht 65.0 in | Wt 218.0 lb

## 2021-04-20 DIAGNOSIS — R1032 Left lower quadrant pain: Secondary | ICD-10-CM | POA: Diagnosis not present

## 2021-04-20 DIAGNOSIS — R1013 Epigastric pain: Secondary | ICD-10-CM

## 2021-04-20 LAB — CBC WITH DIFFERENTIAL/PLATELET
Basophils Absolute: 0 10*3/uL (ref 0.0–0.1)
Basophils Relative: 0.4 % (ref 0.0–3.0)
Eosinophils Absolute: 0.2 10*3/uL (ref 0.0–0.7)
Eosinophils Relative: 1.9 % (ref 0.0–5.0)
HCT: 39.3 % (ref 36.0–46.0)
Hemoglobin: 12.5 g/dL (ref 12.0–15.0)
Lymphocytes Relative: 34.6 % (ref 12.0–46.0)
Lymphs Abs: 3 10*3/uL (ref 0.7–4.0)
MCHC: 31.9 g/dL (ref 30.0–36.0)
MCV: 84.2 fl (ref 78.0–100.0)
Monocytes Absolute: 0.6 10*3/uL (ref 0.1–1.0)
Monocytes Relative: 7 % (ref 3.0–12.0)
Neutro Abs: 4.8 10*3/uL (ref 1.4–7.7)
Neutrophils Relative %: 56.1 % (ref 43.0–77.0)
Platelets: 329 10*3/uL (ref 150.0–400.0)
RBC: 4.67 Mil/uL (ref 3.87–5.11)
RDW: 13.7 % (ref 11.5–15.5)
WBC: 8.6 10*3/uL (ref 4.0–10.5)

## 2021-04-20 LAB — COMPREHENSIVE METABOLIC PANEL
ALT: 12 U/L (ref 0–35)
AST: 18 U/L (ref 0–37)
Albumin: 3.9 g/dL (ref 3.5–5.2)
Alkaline Phosphatase: 72 U/L (ref 39–117)
BUN: 13 mg/dL (ref 6–23)
CO2: 28 mEq/L (ref 19–32)
Calcium: 9.4 mg/dL (ref 8.4–10.5)
Chloride: 105 mEq/L (ref 96–112)
Creatinine, Ser: 1.19 mg/dL (ref 0.40–1.20)
GFR: 53.13 mL/min — ABNORMAL LOW (ref >60.00)
Glucose, Bld: 99 mg/dL (ref 70–99)
Potassium: 4.2 mEq/L (ref 3.5–5.1)
Sodium: 137 mEq/L (ref 135–145)
Total Bilirubin: 0.5 mg/dL (ref 0.2–1.2)
Total Protein: 7.1 g/dL (ref 6.0–8.3)

## 2021-04-20 LAB — LIPASE: Lipase: 28 U/L (ref 11.0–59.0)

## 2021-04-20 NOTE — Progress Notes (Signed)
04/20/2021 Hailey Ross [id number] November 06, 1970   HISTORY OF PRESENT ILLNESS:  This is a 51 year old female who is a patient of Dr. Blanch Media.  She is here today with complaints of episodes of upper abdominal pain that radiate across her entire upper abdomen and a tight bandlike sensation.  I saw her for this complaint back in October after an emergency room visit where they ruled out cardiac cause.  At the time of my visit she had not been having any pain for about a month.  She just had an EGD in 2021 that showed GERD and nonspecific gastritis as below.  I did not think there was any need to repeat that especially since she is on pantoprazole daily and denies any overt heartburn and reflux symptoms.  She continues on the pantoprazole and continues to deny heartburn or reflux.  She does report some belching.  She says that this pain started again about a week ago.  She says that currently it is about a 7 out of 10 on the pain scale.  She says it is constant.  She also reports an occasional intermittent left lower quadrant abdominal pain.  She says that this is usually fleeting, but wanted to bring it up and talk about all she was here.  She denies alcohol use.  She is up-to-date with colonoscopy with that in 09/2019 as well.  She does not have a gallbladder.  09/2019:  1. Nonspecific gastric erythema EGD 2. GERD  Negative Hpylori.   Past Medical History:  Diagnosis Date   Acute cholecystitis 08/24/2016   Acute kidney injury superimposed on chronic kidney disease ([place]) 06/20/2019   Acute respiratory failure with hypoxia ([place]) 06/19/2019   Annual visit for general adult medical examination with abnormal findings 04/16/2019   Arthritis    right knee   Bronchiectasis ([place])    CKD (chronic kidney disease)    Iron deficiency    Lightheadedness 05/10/2019   PE (pulmonary thromboembolism) ([place]) 06/2019   Pre-syncope 04/16/2019   Sarcoidosis    Sarcoidosis    Sleep apnea    c pap   Smoker  02/12/2014   Past Surgical History:  Procedure Laterality Date   CHOLECYSTECTOMY N/A 08/25/2016   Procedure: LAPAROSCOPIC CHOLECYSTECTOMY;  Surgeon: Coralie Keens, MD;  Location: [facility];  Service: General;  Laterality: N/A;   TUBAL LIGATION  02/1993    reports that she quit smoking about 6 years ago. Her smoking use included cigarettes. She has a 20.00 pack-year smoking history. She has never used smokeless tobacco. She reports current alcohol use. She reports that she does not use drugs. family history includes Cancer in her mother; Colon polyps in her mother; Healthy in her daughter, sister, sister, sister, sister, son, and son; Hypertension in her mother. No Known Allergies    Outpatient Encounter Medications as of 04/20/2021  Medication Sig   acetaminophen (TYLENOL) 500 MG tablet Take 500 mg by mouth every 4 (four) hours as needed for mild pain, moderate pain or headache.    albuterol (PROVENTIL) (2.5 MG/3ML) 0.083% nebulizer solution Take 3 mLs (2.5 mg total) by nebulization every 6 (six) hours as needed for wheezing or shortness of breath.   albuterol (VENTOLIN HFA) 108 (90 Base) MCG/ACT inhaler INHALE 2 PUFFS INTO THE LUNGS EVERY 6 HOURS AS NEEDED FOR WHEEZING OR SHORTNESS OF BREATH (Patient taking differently: Inhale 1-2 puffs into the lungs every 6 (six) hours as needed for wheezing or shortness of breath.)  diltiazem (CARDIZEM) 30 MG tablet TAKE 1 TABLET(30 MG) BY MOUTH TWICE DAILY (Patient taking differently: Take 30 mg by mouth 2 (two) times daily.)   lisinopril (ZESTRIL) 2.5 MG tablet Take 2.5 mg by mouth daily.   LORazepam (ATIVAN) 0.5 MG tablet Take 0.5 mg by mouth daily as needed.   pantoprazole (PROTONIX) 40 MG tablet Take 1 tablet (40 mg total) by mouth daily.   prazosin (MINIPRESS) 1 MG capsule Take 1 mg by mouth 2 (two) times daily.   rosuvastatin (CRESTOR) 5 MG tablet Take 1 tablet (5 mg total) by mouth daily.   traZODone (DESYREL) 50 MG tablet Take 50-100 mg by mouth at  bedtime.   Vitamin D, Ergocalciferol, (DRISDOL) 1.25 MG (50000 UNIT) CAPS capsule Take 1 capsule (50,000 Units total) by mouth every 7 (seven) days.   XARELTO 20 MG TABS tablet TAKE 1 TABLET(20 MG) BY MOUTH DAILY WITH SUPPER (Patient taking differently: Take 20 mg by mouth daily with supper.)   No facility-administered encounter medications on file as of 04/20/2021.     REVIEW OF SYSTEMS  : All other systems reviewed and negative except where noted in the History of Present Illness.   PHYSICAL EXAM: BP 134/90    Pulse 70    Ht 5\' 5"  (1.651 m)    Wt 218 lb (98.9 kg)    LMP 04/01/2021 (Exact Date)    SpO2 99%    BMI 36.28 kg/m  General: Well developed AA female in no acute distress Head: Normocephalic and atraumatic Eyes:  Sclerae anicteric, conjunctiva pink. Ears: Normal auditory acuity Lungs: Clear throughout to auscultation; no W/R/R. Heart: Regular rate and rhythm; no M/R/G. Abdomen: Soft, non-distended.  BS present.  Tenderness across entire upper abdomen. Musculoskeletal: Symmetrical with no gross deformities  Skin: No lesions on visible extremities Extremities: No edema  Neurological: Alert oriented x 4, grossly non-focal Psychological:  Alert and cooperative. Normal mood and affect  ASSESSMENT AND PLAN: *Episodes of upper abdominal pain that radiate across her entire upper abdomen and a tight bandlike sensation.  I saw her for this complaint back in October after an emergency room visit where they ruled out cardiac cause.  At the time of my visit she had not been having any pain for about a month.  She just had an EGD in 2021 that showed GERD and nonspecific gastritis.  I did not think there was any need to repeat that especially since she is on pantoprazole daily and denies any overt heartburn and reflux symptoms.  I did suggest that we perform cross-sectional imaging if the pain were to be recurrent.  She also reports an intermittent left lower quadrant abdominal pain, which she  says is more of a fleeting pain on and off.  We did not discuss this at her last visit.  We will plan for CT scan of the abdomen and pelvis with contrast.  We will check a CBC, CMP, lipase today.  Consider trial of dicyclomine if CT scan is unremarkable.   CC:  Noreene Larsson, NP

## 2021-04-20 NOTE — Patient Instructions (Signed)
Your provider has requested that you go to the basement level for lab work before leaving today. Press "B" on the elevator. The lab is located at the first door on the left as you exit the elevator.  You have been scheduled for a CT scan of the abdomen and pelvis at [facility] ([address]---this is in the same building as [employer]).   You are scheduled on Thursday 04/22/21 at 8:30 am. You should arrive 15 minutes prior to your appointment time for registration. Please follow the written instructions below on the day of your exam:  WARNING: IF YOU ARE ALLERGIC TO IODINE/X-RAY DYE, PLEASE NOTIFY RADIOLOGY IMMEDIATELY AT [phone]! YOU WILL BE GIVEN A 13 HOUR PREMEDICATION PREP.  1) Do not eat or drink anything after 4:30 am (4 hours prior to your test) 2) You have been given 2 bottles of oral contrast to drink. The solution may taste better if refrigerated, but do NOT add ice or any other liquid to this solution. Shake well before drinking.    Drink 1 bottle of contrast @ 6:30 am (2 hours prior to your exam)  Drink 1 bottle of contrast @ 7:30 am (1 hour prior to your exam)  You may take any medications as prescribed with a small amount of water, if necessary. If you take any of the following medications: METFORMIN, GLUCOPHAGE, GLUCOVANCE, AVANDAMET, RIOMET, FORTAMET, [place] MET, JANUMET, GLUMETZA or METAGLIP, you MAY be asked to HOLD this medication 48 hours AFTER the exam.  The purpose of you drinking the oral contrast is to aid in the visualization of your intestinal tract. The contrast solution may cause some diarrhea. Depending on your individual set of symptoms, you may also receive an intravenous injection of x-ray contrast/dye. Plan on being at [facility] for 30 minutes or longer, depending on the type of exam you are having performed.  This test typically takes 30-45 minutes to complete.  If you have any questions regarding your exam or if you  need to reschedule, you may call the CT department at [phone] between the hours of 8:00 am and 5:00 pm, Monday-Friday.  ___________________________________________________________________

## 2021-04-21 NOTE — Progress Notes (Signed)
Assessment and plan noted ?

## 2021-04-22 ENCOUNTER — Ambulatory Visit (INDEPENDENT_AMBULATORY_CARE_PROVIDER_SITE_OTHER)
Admission: RE | Admit: 2021-04-22 | Discharge: 2021-04-22 | Disposition: A | Discharge status: Home / Self Care | Payer: 59 | Source: Ambulatory Visit | Attending: Gastroenterology | Admitting: Gastroenterology

## 2021-04-22 ENCOUNTER — Other Ambulatory Visit: Payer: Self-pay

## 2021-04-22 ENCOUNTER — Ambulatory Visit (INDEPENDENT_AMBULATORY_CARE_PROVIDER_SITE_OTHER): Payer: 59 | Admitting: Nurse Practitioner

## 2021-04-22 ENCOUNTER — Encounter: Payer: Self-pay | Admitting: Nurse Practitioner

## 2021-04-22 VITALS — BP 143/89 | HR 75 | Ht 65.0 in | Wt 218.0 lb

## 2021-04-22 DIAGNOSIS — I10 Essential (primary) hypertension: Secondary | ICD-10-CM | POA: Diagnosis not present

## 2021-04-22 DIAGNOSIS — E6609 Other obesity due to excess calories: Secondary | ICD-10-CM | POA: Diagnosis not present

## 2021-04-22 DIAGNOSIS — Z139 Encounter for screening, unspecified: Secondary | ICD-10-CM | POA: Diagnosis not present

## 2021-04-22 DIAGNOSIS — R1032 Left lower quadrant pain: Secondary | ICD-10-CM

## 2021-04-22 DIAGNOSIS — M545 Low back pain, unspecified: Secondary | ICD-10-CM

## 2021-04-22 DIAGNOSIS — Z6837 Body mass index (BMI) 37.0-37.9, adult: Secondary | ICD-10-CM

## 2021-04-22 MED ORDER — IOHEXOL 300 MG/ML  SOLN
100.0000 mL | Freq: Once | INTRAMUSCULAR | Status: AC | PRN
  Administered 2021-04-22: 100 mL via INTRAVENOUS

## 2021-04-22 MED ORDER — METHYLPREDNISOLONE 4 MG PO TBPK: ORAL_TABLET | ORAL | 0 refills | Status: DC

## 2021-04-22 MED ORDER — DICYCLOMINE HCL 10 MG PO CAPS: 10.0000 mg | ORAL_CAPSULE | Freq: Three times a day (TID) | ORAL | 2 refills | Status: DC

## 2021-04-22 MED ORDER — METHYLPREDNISOLONE ACETATE 80 MG/ML IJ SUSP
80.0000 mg | Freq: Once | INTRAMUSCULAR | Status: AC
  Administered 2021-04-22: 80 mg via INTRAMUSCULAR

## 2021-04-22 NOTE — Progress Notes (Signed)
° °  Hailey Ross     MRN: [id number]      DOB: Dec 19, 1970   HPI Hailey Ross is here for complaints of lower back pain that started a week ago, she was getting ready to bend over and pick up when the pain started. She has had this pain before takes tylenol, pan is worse this time than it was first time.Pain is throbbing pain 7/10.Denies numbness, tingling, She had a CT scan done this morning for pressure in her abdomen.    ROS Denies recent fever or chills. Denies sinus pressure, nasal congestion, ear pain or sore throat. Denies chest congestion, productive cough or wheezing. Denies chest pains, palpitations and leg swelling Denies abdominal pain, nausea, vomiting,diarrhea or constipation.   Denies dysuria, frequency, hesitancy or incontinence. Has low back pain denies swelling and limitation in mobility. Denies headaches, seizures, numbness, or tingling. Denies depression, anxiety or insomnia. Denies skin break down or rash.   PE  BP (!) 143/89 (BP Location: Right Arm, Patient Position: Sitting, Cuff Size: Large)    Pulse 75    Ht 5\' 5"  (1.651 m)    Wt 218 lb (98.9 kg)    LMP 04/01/2021 (Exact Date)    SpO2 90%    BMI 36.28 kg/m   Patient alert and oriented and in no cardiopulmonary distress.  HEENT: No facial asymmetry, EOMI,     Neck supple .  Chest: Clear to auscultation bilaterally.  CVS: S1, S2 no murmurs, no S3.Regular rate.  ABD: Soft non tender.   Ext: No edema  MS: Adequate ROM  shoulders, hips and knees. Tenderness on palpation of low back and ROM of spine.   Skin: Intact, no ulcerations or rash noted.  Psych: Good eye contact, normal affect. Memory intact not anxious or depressed appearing.  CNS: CN 2-12 intact, power,  normal throughout.no focal deficits noted.   Assessment & Plan

## 2021-04-22 NOTE — Patient Instructions (Addendum)
Please use your methylprednisolone pill as instructed for your low back pain. You were steroid injection in the office today.  Please get your shingles vaccine at your pharmacy. Please get your labs done 3-5 days before your next visit.   It is important that you exercise regularly at least 30 minutes 5 times a week.  Think about what you will eat, plan ahead. Choose " clean, green, fresh or frozen" over canned, processed or packaged foods which are more sugary, salty and fatty. 70 to 75% of food eaten should be vegetables and fruit. Three meals at set times with snacks allowed between meals, but they must be fruit or vegetables. Aim to eat over a 12 hour period , example 7 am to 7 pm, and STOP after  your last meal of the day. Drink water,generally about 64 ounces per day, no other drink is as healthy. Fruit juice is best enjoyed in a healthy way, by EATING the fruit.  Thanks for choosing [facility], we consider it a privelige to serve you.

## 2021-04-22 NOTE — Assessment & Plan Note (Addendum)
Importance of healthy food choices with portion control discussed as well as eating regularly within 12  hour window.   The need to choose clean green food 50%-75% of time is discussed as well as make water the primary drink and set a goal for 64 ounces daily.  Patient reeducated about the importance of committment to minimum of 150 minutes of exercise per week.  Three meals at set times with snacks allowed between meals but they must be fruit or vegetable.   Aim to eat  over 12 hour period  for example 7 am to 7 pm. Stop after your last meal of the day.   Wt Readings from Last 3 Encounters:  04/22/21 218 lb (98.9 kg)  04/20/21 218 lb (98.9 kg)  03/22/21 218 lb 6.4 oz (99.1 kg)  pt has CKD and hypertension.

## 2021-04-22 NOTE — Assessment & Plan Note (Addendum)
Depo 80mg  injection given today. methylprednisolone 4mg  pack ordered, pt told to follow up with otho if pain does not improve. Use tylenol as needed.

## 2021-04-22 NOTE — Assessment & Plan Note (Signed)
DASH diet and commitment to daily physical activity for a minimum of 30 minutes discussed and encouraged, as a part of hypertension management. The importance of attaining a healthy weight is also discussed.  BP/Weight 04/22/2021 04/20/2021 03/22/2021 03/15/2021 03/11/2021 01/26/2021 01/22/2021  Systolic BP 143 134 130 112 142 108 134  Diastolic BP 89 90 90 84 94 70 87  Wt. (Lbs) 218 218 218.4 218.8 222.4 214.13 214.6  BMI 36.28 36.28 36.34 36.41 37.01 35.63 35.71  continue current medications

## 2021-04-26 ENCOUNTER — Encounter: Payer: Self-pay | Admitting: Nurse Practitioner

## 2021-04-26 NOTE — Telephone Encounter (Signed)
Please advise pt to follow up with otho. Thank you.

## 2021-04-27 NOTE — Telephone Encounter (Signed)
Hailey Ross, please see MyChart message from patient and advise.

## 2021-04-28 ENCOUNTER — Telehealth: Payer: Self-pay

## 2021-04-28 NOTE — Telephone Encounter (Signed)
Spoke with pt regarding abdominal pain. She stated that it is still the same intermittent LLQ pain radiating from back to side that she was seen for in the office on 04/20/20 with PA. Currently takes Tylenol to relieve the pain. Normal bowel movements. Janett Billow Zehr recommended an OV with Dr. Henrene Pastor. OV scheduled for 05/31/21 at 10:40 am. Pt aware, no further questions.

## 2021-05-27 NOTE — Progress Notes (Signed)
Office Visit Note  Patient: Hailey Ross             Date of Birth: [birth year]           MRN: [id number]             PCP: Renee Rival, FNP Referring: Noreene Larsson, NP Visit Date: 06/09/2021 Occupation: @GUAROCC @  Subjective:  Left knee pain   History of Present Illness: Hailey Ross is a 51 y.o. female with history of sarcoidosis and osteoarthritis.  She is not currently taking any immunosuppressive agents.  She declined starting on Imuran at her last follow-up visit with Dr. Estanislado Pandy.  She continues to follow-up closely with Dr. Halford Chessman due to history of pulmonary sarcoidosis.  She was evaluated at urgent care on 06/07/2021 at which time she was diagnosed with community-acquired pneumonia and is currently being treated with Augmentin.  Her cough continues to improve.  She has not had any recent fevers. Patient reports that she continues to have intermittent pain and swelling in the left knee joint.  Her overall her discomfort has been less severe.  She had a cortisone injection on 01/19/2021 which provided significant relief.  She requested a repeat cortisone injection today.  She states that her right knee joint pain has improved.  She has been having some increased discomfort on the lateral aspect of her left hip due to trochanter bursitis.  She has pain at night when lying on her left side. She denies any recent rashes.  She has not had any recent eye pain or inflammation.  She denies any other new concerns.      Activities of Daily Living:  Patient reports morning stiffness for 10-15 minutes.   Patient Reports nocturnal pain.  Difficulty dressing/grooming: Denies Difficulty climbing stairs: Reports Difficulty getting out of chair: Denies Difficulty using hands for taps, buttons, cutlery, and/or writing: Denies  Review of Systems  Constitutional:  Negative for fatigue.  HENT:  Positive for mouth dryness and nose dryness. Negative for mouth sores.   Eyes:   Positive for pain, itching and dryness.  Respiratory:  Positive for shortness of breath. Negative for difficulty breathing.   Cardiovascular:  Negative for chest pain and palpitations.  Gastrointestinal:  Negative for blood in stool, constipation and diarrhea.  Endocrine: Negative for increased urination.  Genitourinary:  Negative for difficulty urinating.  Musculoskeletal:  Positive for joint pain, joint pain, joint swelling, myalgias, morning stiffness, muscle tenderness and myalgias.  Skin:  Negative for color change, rash and redness.  Allergic/Immunologic: Negative for susceptible to infections.  Neurological:  Positive for dizziness. Negative for numbness, headaches, memory loss and weakness.  Hematological:  Positive for bruising/bleeding tendency.  Psychiatric/Behavioral:  Negative for confusion.    PMFS History:  Patient Active Problem List   Diagnosis Date Noted   Acute midline low back pain without sciatica 04/22/2021   Left lower quadrant abdominal pain 04/20/2021   Atypical chest pain 01/26/2021   Synovitis of left knee 01/19/2021   GERD (gastroesophageal reflux disease) 12/29/2020   Abdominal pain, epigastric 12/28/2020   Primary osteoarthritis of left knee 10/27/2020   Grief at loss of child 06/09/2020   Insomnia 06/09/2020   Right ankle swelling 02/05/2020   Encounter for examination following treatment at hospital 01/28/2020   Palpitations 01/28/2020   Menorrhagia with regular cycle 10/15/2019   OSA (obstructive sleep apnea) 10/08/2019   Right knee pain 08/29/2019   Positive ANA (antinuclear antibody) 08/29/2019   Essential hypertension 07/04/2019  Pain and swelling of right knee 07/04/2019   Hyperlipidemia 07/04/2019   Stage 2 chronic kidney disease 07/04/2019   Sarcoidosis of lung ([place]) 07/04/2019   Encounter for support and coordination of transition of care 07/04/2019   Daytime hypersomnolence 07/02/2019   Pulmonary hypertension ([place]) 07/02/2019   CKD  (chronic kidney disease)    Bilateral pulmonary embolism ([place]) 06/18/2019   Lightheadedness 05/10/2019   Vitamin D deficiency 05/10/2019   Class 2 obesity due to excess calories with body mass index (BMI) of 37.0 to 37.9 in adult 04/16/2019   Sarcoidosis 05/04/2015   Bronchiectasis ([place]) 05/04/2015    Past Medical History:  Diagnosis Date   Acute cholecystitis 08/24/2016   Acute kidney injury superimposed on chronic kidney disease ([place]) 06/20/2019   Acute respiratory failure with hypoxia ([place]) 06/19/2019   Annual visit for general adult medical examination with abnormal findings 04/16/2019   Arthritis    right knee   Bronchiectasis ([place])    CKD (chronic kidney disease)    Iron deficiency    Lightheadedness 05/10/2019   PE (pulmonary thromboembolism) ([place]) 06/2019   Pre-syncope 04/16/2019   Sarcoidosis    Sarcoidosis    Sleep apnea    c pap   Smoker 02/12/2014    Family History  Problem Relation Age of Onset   Hypertension Mother    Cancer Mother    Colon polyps Mother    Healthy Sister    Healthy Sister    Healthy Sister    Healthy Sister    Healthy Son    Healthy Son    Healthy Daughter    Colon cancer Neg Hx    Esophageal cancer Neg Hx    Rectal cancer Neg Hx    Stomach cancer Neg Hx    Past Surgical History:  Procedure Laterality Date   CHOLECYSTECTOMY N/A 08/25/2016   Procedure: LAPAROSCOPIC CHOLECYSTECTOMY;  Surgeon: Coralie Keens, MD;  [facility];  Service: General;  Laterality: N/A;   TUBAL LIGATION  02/1993   Social History   Social History Narrative   Live with partners [name]-19 years   3 children.    1-[name] (girl) 27 at home with her: 2 grandchildren in her home as well    [name]- 58 expecting   [name]-25      Enjoy: read, play games on tablet, grandkids      Diet: Veggies, does not eat a lot of fried foods, enjoys chicken and fruit   Caffeine: sodas and coffee (with sugar)   Water: Does not drink a lot at all      Wears seat beat   Does  not wear sunscreen   Smoke and carbon monoxide detectors   Does not use phone while driving    Immunization History  Administered Date(s) Administered   Influenza Inj Mdck Quad Pf 12/25/2019   Influenza,inj,Quad PF,6+ Mos 12/21/2018, 02/22/2021   Influenza,inj,Quad PF,6-35 Mos 01/10/2019   PFIZER(Purple Top)SARS-COV-2 Vaccination 08/14/2019, 09/10/2019, 02/07/2020   Pneumococcal Polysaccharide-23 02/12/2014   Tdap 02/12/2014     Objective: Vital Signs: BP 127/85 (BP Location: Left Arm, Patient Position: Sitting, Cuff Size: Large)    Pulse (!) 58    Ht 5\' 5"  (1.651 m)    Wt 218 lb (98.9 kg)    BMI 36.28 kg/m    Physical Exam Vitals and nursing note reviewed.  Constitutional:      Appearance: She is well-developed.  HENT:     Head: Normocephalic and atraumatic.  Eyes:     Conjunctiva/sclera:  Conjunctivae normal.  Cardiovascular:     Rate and Rhythm: Normal rate and regular rhythm.     Heart sounds: Normal heart sounds.  Pulmonary:     Effort: Pulmonary effort is normal.     Breath sounds: Normal breath sounds.  Abdominal:     General: Bowel sounds are normal.     Palpations: Abdomen is soft.  Musculoskeletal:     Cervical back: Normal range of motion.  Lymphadenopathy:     Cervical: No cervical adenopathy.  Skin:    General: Skin is warm and dry.     Capillary Refill: Capillary refill takes less than 2 seconds.  Neurological:     Mental Status: She is alert and oriented to person, place, and time.  Psychiatric:        Behavior: Behavior normal.     Musculoskeletal Exam: C-spine, thoracic spine, lumbar spine have good range of motion with no discomfort.  Shoulder joints, elbow joints, wrist joints, MCPs, PIPs, DIPs have good range of motion with no synovitis.  PIP and DIP thickening consistent with osteoarthritis of both hands.  She was able to make a complete fist bilaterally.  Hip joints have good range of motion with no groin pain.  Tenderness palpation over the left  trochanteric bursa.  Right knee joint has good range of motion with no warmth or effusion.  Left knee joint has a small effusion.  Ankle joints have good range of motion with no tenderness or joint swelling.  CDAI Exam: CDAI Score: -- Patient Global: --; Provider Global: -- Swollen: --; Tender: -- Joint Exam 06/09/2021   No joint exam has been documented for this visit   There is currently no information documented on the homunculus. Go to the Rheumatology activity and complete the homunculus joint exam.  Investigation: No additional findings.  Imaging: DG Chest 2 View  Result Date: 06/07/2021 CLINICAL DATA:  Cough EXAM: CHEST - 2 VIEW COMPARISON:  Chest x-ray 03/15/2021 FINDINGS: Heart size is normal. Mediastinum appears stable and within normal limits. Calcified plaques in the aortic arch. Extensive prominent interstitial lung markings bilaterally mostly near the apices and in the right lower lung zone. New focal opacity in the right lower lung zone since previous study. No pleural effusion or pneumothorax. IMPRESSION: 1. Focal opacity in the right lower lung zone which is new since previous study and could represent pneumonia. Recommend follow-up until resolution to exclude possible neoplasm. 2. Chronic interstitial lung disease. Electronically Signed   By: Jannifer Hick M.D.   On: 06/07/2021 16:38    Recent Labs: Lab Results  Component Value Date   WBC 8.6 04/20/2021   HGB 12.5 04/20/2021   PLT 329.0 04/20/2021   NA 137 04/20/2021   K 4.2 04/20/2021   CL 105 04/20/2021   CO2 28 04/20/2021   GLUCOSE 99 04/20/2021   BUN 13 04/20/2021   CREATININE 1.19 04/20/2021   BILITOT 0.5 04/20/2021   ALKPHOS 72 04/20/2021   AST 18 04/20/2021   ALT 12 04/20/2021   PROT 7.1 04/20/2021   ALBUMIN 3.9 04/20/2021   CALCIUM 9.4 04/20/2021   GFRAA 62 09/23/2020   QFTBGOLD Negative 05/01/2015   QFTBGOLDPLUS NEGATIVE 11/08/2019    Speciality Comments: No specialty comments  available.  Procedures:  Large Joint Inj: L knee on 06/09/2021 9:05 AM Indications: pain Details: 27 G 1.5 in needle, medial approach  Arthrogram: No  Medications: 1.5 mL lidocaine 1 %; 40 mg triamcinolone acetonide 40 MG/ML Aspirate: 0 mL Outcome: tolerated well,  no immediate complications Procedure, treatment alternatives, risks and benefits explained, specific risks discussed. Consent was given by the patient. Immediately prior to procedure a time out was called to verify the correct patient, procedure, equipment, support staff and site/side marked as required. Patient was prepped and draped in the usual sterile fashion.    Allergies: Patient has no known allergies.      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 Halford Chessman in 2020 with no recurrence.  She is followed by Dr. Halford Chessman.  Her last [facility] at [facility] pulmonary was on 03/22/2021 and the office visit note was reviewed today. She is not a good candidate for methotrexate due to elevated creatinine. TPMT was within normal limits in July 2021. CBC and CMP updated on 04/20/2021. She is not currently taking any immunosuppressive agents. She presented to urgent care on 06/07/2020 at which time she was diagnosed with community-acquired pneumonia of the right lower lung. CXR performed on 06/07/2021: Focal opacity in the right lower lung zone which is new since previous study and could worse represent pneumonia.  Chronic interstitial lung disease. She was started on Augmentin.  Her cough has been improving and she has not had any recent fevers.  She is not experiencing any shortness of breath at this time. She has not had any recent rashes or eye pain/inflammation.  She continues to have recurrent left knee joint effusions.  She had a left knee joint cortisone injection on 01/19/2021.  At her office visit on 03/11/2021 with Dr. Estanislado Pandy the patient was strongly encouraged to consider initiating  Imuran to prevent future flares and progression of the disease.  The patient declined at that time.  I discussed indications, contraindications, potential side effects of Imuran today in detail.  She is hesitant to take an immunosuppressive agent at this time.  She requested a left knee joint cortisone injection today to alleviate her current symptoms.  She was advised to notify us if she develops any new or worsening symptoms.  She will also notify us if she would like to initiate Imuran in the future.  She will follow-up in the office in 3 months.  Positive ANA (antinuclear antibody) - Low titer positive ANA, ENA negative, anticardiolipin negative, beta-2 GP 1 negative, lupus anticoagulant negative (lupus anticoagulant was positive once).  She has no other clinical features of autoimmune disease at this time.  She is not currently taking any immunosuppressive agents.  Primary osteoarthritis of both hands - Clinical findings consistent with osteoarthritis.  She has PIP and DIP thickening consistent with osteoarthritis of both hands.  No tenderness or inflammation was noted on examination today.  She was able to make a complete fist bilaterally.  Trochanteric bursitis of left hip: Patient presents today with discomfort on the lateral aspect of the left hip consistent with left trochanteric bursitis.  Her discomfort is exacerbated by lying on her left side at night.  Different treatment options were discussed today.  She was given a handout of exercises to perform.  Several exercises were demonstrated today in the office.  She was advised to notify us if her symptoms persist or worsen at which time we can refer her to physical therapy.  Effusion, right knee - Treated by Dr. Erlinda Hong for radial tear of the medial meniscus.  No recurrence of effusion.  Chronic pain of left knee: She continues to experience intermittent flares in the left knee.  She has a small effusion in the  left knee on examination today.  She  had a cortisone injection on 01/19/2021 which provided significant relief.  At her last office visit on 03/11/2021 with Dr. Estanislado Pandy there was discussion of adding on Imuran to prevent future flares but she declined at that time.  She does not want to start on Imuran at this time since her symptoms have been more tolerable.  She did request a left knee joint cortisone injection.  She tolerated the procedure well.  Procedure note was completed above.  Aftercare was discussed.  She was advised to notify us if her left knee joint pain persists or worsens.  Other medical conditions are listed as follows:  Stage 2 chronic kidney disease: She is not a good candidate for methotrexate in the future.  Pulmonary hypertension (HCC)  Bilateral pulmonary embolism ([facility])  Bronchiectasis without complication ([place])  Mixed hyperlipidemia  Essential hypertension - Blood pressure was 127/85 today in the office.  Secondary hyperparathyroidism ([place])  Vitamin D deficiency  OSA (obstructive sleep apnea)  Orders: Orders Placed This Encounter  Procedures   Large Joint Inj   No orders of the defined types were placed in this encounter.   Follow-Up Instructions: Return in 3 months (on 09/09/2021) for Sarcoidosis, Osteoarthritis.   Ofilia Neas, PA-C  Note - This record has been created using Dragon software.  Chart creation errors have been sought, but may not always  have been located. Such creation errors do not reflect on  the standard of medical care.

## 2021-05-31 ENCOUNTER — Encounter: Payer: Self-pay | Admitting: Internal Medicine

## 2021-05-31 ENCOUNTER — Ambulatory Visit (INDEPENDENT_AMBULATORY_CARE_PROVIDER_SITE_OTHER): Payer: 59 | Admitting: Internal Medicine

## 2021-05-31 VITALS — BP 130/86 | HR 63 | Ht 65.0 in | Wt 220.0 lb

## 2021-05-31 DIAGNOSIS — K219 Gastro-esophageal reflux disease without esophagitis: Secondary | ICD-10-CM | POA: Diagnosis not present

## 2021-05-31 DIAGNOSIS — R1032 Left lower quadrant pain: Secondary | ICD-10-CM

## 2021-05-31 NOTE — Progress Notes (Signed)
HISTORY OF PRESENT ILLNESS:  Hailey Ross is a 51 y.o. 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  I saw the patient on November 12, 2019 regarding GERD and lower abdominal/pelvic discomfort.  She was last seen in this office by the GI PA April 20, 2021 regarding intermittent lower abdominal discomfort.  Laboratories were unremarkable.  CT scan was negative for acute abnormalities.  Nothing to explain pain.  She then developed severe left lower quadrant discomfort with radiation into the left flank and back.  No hematuria.  No dysuria.  No history of back problems no history of kidney stones.  Problem resolved.  She did take Bentyl which did not help.  She states that she feels fine.  She does have sort of low-grade discomfort in her lower abdomen.  She has had some form of this for some time.  No problems with her bowels.  Last upper endoscopy June 2021.  Last colonoscopy June 2021.  The examinations were essentially normal.  No new complaints  REVIEW OF SYSTEMS:  All non-GI ROS negative unless otherwise stated in the HPI except for  Past Medical History:  Diagnosis Date   Acute cholecystitis 08/24/2016   Acute kidney injury superimposed on chronic kidney disease ([place] ) 06/20/2019   Acute respiratory failure with hypoxia ([place]) 06/19/2019   Annual visit for general adult medical examination with abnormal findings 04/16/2019   Arthritis    right knee   Bronchiectasis ([place])    CKD (chronic kidney disease)    Iron deficiency    Lightheadedness 05/10/2019   PE (pulmonary thromboembolism) ([place]) 06/2019   Pre-syncope 04/16/2019   Sarcoidosis    Sarcoidosis    Sleep apnea    c pap   Smoker 02/12/2014    Past Surgical History:  Procedure Laterality Date   CHOLECYSTECTOMY N/A 08/25/2016   Procedure: LAPAROSCOPIC CHOLECYSTECTOMY;  Surgeon: Coralie Keens, MD;  Location: [facility];  Service: General;  Laterality:  N/A;   TUBAL LIGATION  02/1993    Social History SHAVETTA RUELAS  reports that she quit smoking about 6 years ago. Her smoking use included cigarettes. She has a 20.00 pack-year smoking history. She has never used smokeless tobacco. She reports current alcohol use. She reports that she does not use drugs.  family history includes Cancer in her mother; Colon polyps in her mother; Healthy in her daughter, sister, sister, sister, sister, son, and son; Hypertension in her mother.  No Known Allergies     PHYSICAL EXAMINATION: Vital signs: BP 130/86    Pulse 63    Ht 5\' 5"  (1.651 m)    Wt 220 lb (99.8 kg)    BMI 36.61 kg/m   Constitutional: generally well-appearing, no acute distress Psychiatric: alert and oriented x3, cooperative Eyes: extraocular movements intact, anicteric, conjunctiva pink Mouth: oral pharynx moist, no lesions Neck: supple no lymphadenopathy Cardiovascular: heart regular rate and rhythm, no murmur Lungs: clear to auscultation bilaterally Abdomen: soft, nontender, nondistended, no obvious ascites, no peritoneal signs, normal bowel sounds, no organomegaly Rectal: Omitted Extremities: no clubbing, cyanosis, or lower extremity edema bilaterally Skin: no lesions on visible extremities Neuro: No focal deficits.  Cranial nerves intact  ASSESSMENT:  1.  Chronic intermittent lower abdominal discomfort.  Severe transient lower abdominal discomfort resolved.  Negative extensive work-up.  No primary GI problem identified to explain pain, or suspected.  May be GYN.  In any event, doing okay now.  2.  Normal colonoscopy June 2021 3.  Normal EGD June 2021 4.  GERD.  Asymptomatic on PPI  PLAN:  1.  Reflux precautions 2.  Continue PPI 3.  Repeat colonoscopy June 2031 for routine screening 4.  Interval follow-up as needed

## 2021-05-31 NOTE — Patient Instructions (Signed)
If you are age 51 or older, your body mass index should be between 23-30. Your Body mass index is 36.61 kg/m. If this is out of the aforementioned range listed, please consider follow up with your Primary Care Provider.  If you are age 89 or younger, your body mass index should be between 19-25. Your Body mass index is 36.61 kg/m. If this is out of the aformentioned range listed, please consider follow up with your Primary Care Provider.   ________________________________________________________  The [facility] providers would like to encourage you to use [facility] to communicate with providers for non-urgent requests or questions.  Due to long hold times on the telephone, sending your provider a message by [facility] may be a faster and more efficient way to get a response.  Please allow 48 business hours for a response.  Please remember that this is for non-urgent requests.  _______________________________________________________  Please follow up as needed

## 2021-06-07 ENCOUNTER — Other Ambulatory Visit: Payer: Self-pay

## 2021-06-07 ENCOUNTER — Ambulatory Visit
Admission: RE | Admit: 2021-06-07 | Discharge: 2021-06-07 | Disposition: A | Discharge status: Home / Self Care | Payer: 59 | Source: Ambulatory Visit | Attending: Urgent Care | Admitting: Urgent Care

## 2021-06-07 ENCOUNTER — Ambulatory Visit (INDEPENDENT_AMBULATORY_CARE_PROVIDER_SITE_OTHER): Payer: 59

## 2021-06-07 VITALS — BP 132/91 | HR 80 | Temp 98.6°F | Resp 18

## 2021-06-07 DIAGNOSIS — J189 Pneumonia, unspecified organism: Secondary | ICD-10-CM

## 2021-06-07 DIAGNOSIS — R059 Cough, unspecified: Secondary | ICD-10-CM

## 2021-06-07 DIAGNOSIS — D869 Sarcoidosis, unspecified: Secondary | ICD-10-CM | POA: Diagnosis not present

## 2021-06-07 DIAGNOSIS — Z86711 Personal history of pulmonary embolism: Secondary | ICD-10-CM

## 2021-06-07 DIAGNOSIS — R042 Hemoptysis: Secondary | ICD-10-CM | POA: Diagnosis not present

## 2021-06-07 DIAGNOSIS — R058 Other specified cough: Secondary | ICD-10-CM | POA: Diagnosis not present

## 2021-06-07 MED ORDER — AMOXICILLIN-POT CLAVULANATE 875-125 MG PO TABS: 1.0000 | ORAL_TABLET | Freq: Two times a day (BID) | ORAL | 0 refills | Status: DC

## 2021-06-07 NOTE — ED Provider Notes (Signed)
[place]   MRN: [id number] DOB: May 14, 1970  Subjective:   Hailey Ross is a 52 y.o. female presenting for 1 week history of persistent productive cough, intermittent hemoptysis.  Has chest pain with taking a deep breath.  Has a history of sarcoidosis, is doing well with this, gets regular follow-up. No shob, wheezing.  Has a history of bilateral pulmonary embolism, is on Xarelto for this.  She is not a smoker.  No history of asthma.  No current facility-administered medications for this encounter.  Current Outpatient Medications:    acetaminophen (TYLENOL) 500 MG tablet, Take 500 mg by mouth every 4 (four) hours as needed for mild pain, moderate pain or headache. , Disp: , Rfl:    albuterol (PROVENTIL) (2.5 MG/3ML) 0.083% nebulizer solution, Take 3 mLs (2.5 mg total) by nebulization every 6 (six) hours as needed for wheezing or shortness of breath., Disp: 75 mL, Rfl: 1   albuterol (VENTOLIN HFA) 108 (90 Base) MCG/ACT inhaler, INHALE 2 PUFFS INTO THE LUNGS EVERY 6 HOURS AS NEEDED FOR WHEEZING OR SHORTNESS OF BREATH (Patient taking differently: Inhale 1-2 puffs into the lungs every 6 (six) hours as needed for wheezing or shortness of breath.), Disp: 6.7 g, Rfl: 5   diltiazem (CARDIZEM) 30 MG tablet, TAKE 1 TABLET(30 MG) BY MOUTH TWICE DAILY (Patient taking differently: Take 30 mg by mouth 2 (two) times daily.), Disp: 180 tablet, Rfl: 3   lisinopril (ZESTRIL) 5 MG tablet, Take 5 mg by mouth daily., Disp: , Rfl:    LORazepam (ATIVAN) 0.5 MG tablet, Take 0.5 mg by mouth daily as needed., Disp: , Rfl:    pantoprazole (PROTONIX) 40 MG tablet, Take 1 tablet (40 mg total) by mouth daily., Disp: 90 tablet, Rfl: 3   prazosin (MINIPRESS) 1 MG capsule, Take 1 mg by mouth 2 (two) times daily., Disp: , Rfl:    rosuvastatin (CRESTOR) 5 MG tablet, Take 1 tablet (5 mg total) by mouth daily., Disp: 90 tablet, Rfl: 1   traZODone (DESYREL) 50 MG tablet, Take 50-100 mg by mouth at bedtime.,  Disp: , Rfl:    Vitamin D, Ergocalciferol, (DRISDOL) 1.25 MG (50000 UNIT) CAPS capsule, Take 1 capsule (50,000 Units total) by mouth every 7 (seven) days., Disp: 12 capsule, Rfl: 1   XARELTO 20 MG TABS tablet, TAKE 1 TABLET(20 MG) BY MOUTH DAILY WITH SUPPER (Patient taking differently: Take 20 mg by mouth daily with supper.), Disp: 90 tablet, Rfl: 2   zolpidem (AMBIEN) 10 MG tablet, Take 10 mg by mouth at bedtime as needed., Disp: , Rfl:    No Known Allergies  Past Medical History:  Diagnosis Date   Acute cholecystitis 08/24/2016   Acute kidney injury superimposed on chronic kidney disease ([place]) 06/20/2019   Acute respiratory failure with hypoxia ([place]) 06/19/2019   Annual visit for general adult medical examination with abnormal findings 04/16/2019   Arthritis    right knee   Bronchiectasis ([place])    CKD (chronic kidney disease)    Iron deficiency    Lightheadedness 05/10/2019   PE (pulmonary thromboembolism) ([place]) 06/2019   Pre-syncope 04/16/2019   Sarcoidosis    Sarcoidosis    Sleep apnea    c pap   Smoker 02/12/2014     Past Surgical History:  Procedure Laterality Date   CHOLECYSTECTOMY N/A 08/25/2016   Procedure: LAPAROSCOPIC CHOLECYSTECTOMY;  Surgeon: Coralie Keens, MD;  Location: [facility];  Service: General;  Laterality: N/A;   TUBAL LIGATION  02/1993    Family  History  Problem Relation Age of Onset   Hypertension Mother    Cancer Mother    Colon polyps Mother    Healthy Sister    Healthy Sister    Healthy Sister    Healthy Sister    Healthy Son    Healthy Son    Healthy Daughter    Colon cancer Neg Hx    Esophageal cancer Neg Hx    Rectal cancer Neg Hx    Stomach cancer Neg Hx     Social History   Tobacco Use   Smoking status: Former    Packs/day: 1.00    Years: 20.00    Pack years: 20.00    Types: Cigarettes    Quit date: 03/21/2015    Years since quitting: 6.2   Smokeless tobacco: Never  Vaping Use   Vaping Use: Never used  Substance Use Topics    Alcohol use: Yes    Alcohol/week: 0.0 standard drinks    Comment: Rare   Drug use: Never    ROS   Objective:   Vitals: BP (!) 132/91 (BP Location: Right Arm)    Pulse 80    Temp 98.6 F (37 C) (Oral)    Resp 18    LMP  (Approximate)    SpO2 95%   Physical Exam Constitutional:      General: She is not in acute distress.    Appearance: Normal appearance. She is well-developed. She is not ill-appearing, toxic-appearing or diaphoretic.  HENT:     Head: Normocephalic and atraumatic.     Nose: Nose normal.     Mouth/Throat:     Mouth: Mucous membranes are moist.  Eyes:     General: No scleral icterus.       Right eye: No discharge.        Left eye: No discharge.     Extraocular Movements: Extraocular movements intact.  Cardiovascular:     Rate and Rhythm: Normal rate.     Heart sounds: No murmur heard.   No friction rub. No gallop.  Pulmonary:     Effort: Pulmonary effort is normal. No respiratory distress.     Breath sounds: No stridor. No wheezing, rhonchi or rales.     Comments: Decreased lung sounds in bibasilar fields. Chest:     Chest wall: No tenderness.  Skin:    General: Skin is warm and dry.  Neurological:     General: No focal deficit present.     Mental Status: She is alert and oriented to person, place, and time.  Psychiatric:        Mood and Affect: Mood normal.        Behavior: Behavior normal.   DG Chest 2 View  Result Date: 06/07/2021 CLINICAL DATA:  Cough EXAM: CHEST - 2 VIEW COMPARISON:  Chest x-ray 03/15/2021 FINDINGS: Heart size is normal. Mediastinum appears stable and within normal limits. Calcified plaques in the aortic arch. Extensive prominent interstitial lung markings bilaterally mostly near the apices and in the right lower lung zone. New focal opacity in the right lower lung zone since previous study. No pleural effusion or pneumothorax. IMPRESSION: 1. Focal opacity in the right lower lung zone which is new since previous study and could  represent pneumonia. Recommend follow-up until resolution to exclude possible neoplasm. 2. Chronic interstitial lung disease. Electronically Signed   By: Jannifer Hick M.D.   On: 06/07/2021 16:38    Assessment and Plan :   PDMP not reviewed this encounter.  1. Community acquired pneumonia of right lower lobe of lung   2. Productive cough   3. Hemoptysis   4. Sarcoidosis   5. History of pulmonary embolism     Start Augmentin for CAP. Follow up asap with pulmonology. Maintain all regular medications. Use supportive care. Counseled patient on potential for adverse effects with medications prescribed/recommended today, ER and return-to-clinic precautions discussed, patient verbalized understanding.    Jaynee Eagles, [place] 06/07/21 1654

## 2021-06-07 NOTE — Telephone Encounter (Signed)
Last seen 03/2021. Please advise if needs an OV.

## 2021-06-07 NOTE — Telephone Encounter (Signed)
Will need to be seen  Can try Delsym 2 tsp bid As needed  cough   Can double book me tomorrow  Please contact office for sooner follow up if symptoms do not improve or worsen or seek emergency care

## 2021-06-07 NOTE — ED Triage Notes (Signed)
Pt reports cough, green phlegm with blood spots and sinus pressure x 1 week.

## 2021-06-09 ENCOUNTER — Encounter: Payer: Self-pay | Admitting: Physician Assistant

## 2021-06-09 ENCOUNTER — Ambulatory Visit (INDEPENDENT_AMBULATORY_CARE_PROVIDER_SITE_OTHER): Payer: 59 | Admitting: Physician Assistant

## 2021-06-09 ENCOUNTER — Other Ambulatory Visit: Payer: Self-pay

## 2021-06-09 VITALS — BP 127/85 | HR 58 | Ht 65.0 in | Wt 218.0 lb

## 2021-06-09 DIAGNOSIS — D869 Sarcoidosis, unspecified: Secondary | ICD-10-CM | POA: Diagnosis not present

## 2021-06-09 DIAGNOSIS — G8929 Other chronic pain: Secondary | ICD-10-CM

## 2021-06-09 DIAGNOSIS — R768 Other specified abnormal immunological findings in serum: Secondary | ICD-10-CM | POA: Diagnosis not present

## 2021-06-09 DIAGNOSIS — M25562 Pain in left knee: Secondary | ICD-10-CM

## 2021-06-09 DIAGNOSIS — E559 Vitamin D deficiency, unspecified: Secondary | ICD-10-CM

## 2021-06-09 DIAGNOSIS — M7062 Trochanteric bursitis, left hip: Secondary | ICD-10-CM | POA: Diagnosis not present

## 2021-06-09 DIAGNOSIS — M19041 Primary osteoarthritis, right hand: Secondary | ICD-10-CM

## 2021-06-09 DIAGNOSIS — N2581 Secondary hyperparathyroidism of renal origin: Secondary | ICD-10-CM

## 2021-06-09 DIAGNOSIS — J479 Bronchiectasis, uncomplicated: Secondary | ICD-10-CM

## 2021-06-09 DIAGNOSIS — M25461 Effusion, right knee: Secondary | ICD-10-CM

## 2021-06-09 DIAGNOSIS — I2699 Other pulmonary embolism without acute cor pulmonale: Secondary | ICD-10-CM

## 2021-06-09 DIAGNOSIS — E782 Mixed hyperlipidemia: Secondary | ICD-10-CM

## 2021-06-09 DIAGNOSIS — I272 Pulmonary hypertension, unspecified: Secondary | ICD-10-CM

## 2021-06-09 DIAGNOSIS — G4733 Obstructive sleep apnea (adult) (pediatric): Secondary | ICD-10-CM

## 2021-06-09 DIAGNOSIS — M19042 Primary osteoarthritis, left hand: Secondary | ICD-10-CM

## 2021-06-09 DIAGNOSIS — M7061 Trochanteric bursitis, right hip: Secondary | ICD-10-CM

## 2021-06-09 DIAGNOSIS — N182 Chronic kidney disease, stage 2 (mild): Secondary | ICD-10-CM

## 2021-06-09 DIAGNOSIS — I10 Essential (primary) hypertension: Secondary | ICD-10-CM

## 2021-06-09 MED ORDER — LIDOCAINE HCL 1 % IJ SOLN
1.5000 mL | INTRAMUSCULAR | Status: AC | PRN
  Administered 2021-06-09: 1.5 mL

## 2021-06-09 MED ORDER — TRIAMCINOLONE ACETONIDE 40 MG/ML IJ SUSP
40.0000 mg | INTRAMUSCULAR | Status: AC | PRN
  Administered 2021-06-09: 40 mg via INTRA_ARTICULAR

## 2021-06-09 NOTE — Patient Instructions (Signed)

## 2021-06-16 ENCOUNTER — Ambulatory Visit (INDEPENDENT_AMBULATORY_CARE_PROVIDER_SITE_OTHER): Payer: 59 | Admitting: Adult Health

## 2021-06-16 ENCOUNTER — Other Ambulatory Visit: Payer: Self-pay

## 2021-06-16 ENCOUNTER — Ambulatory Visit: Payer: 59

## 2021-06-16 ENCOUNTER — Encounter: Payer: Self-pay | Admitting: Adult Health

## 2021-06-16 VITALS — BP 130/82 | HR 59 | Ht 65.0 in | Wt 215.6 lb

## 2021-06-16 DIAGNOSIS — J181 Lobar pneumonia, unspecified organism: Secondary | ICD-10-CM | POA: Diagnosis not present

## 2021-06-16 DIAGNOSIS — D860 Sarcoidosis of lung: Secondary | ICD-10-CM

## 2021-06-16 DIAGNOSIS — J189 Pneumonia, unspecified organism: Secondary | ICD-10-CM | POA: Diagnosis not present

## 2021-06-16 DIAGNOSIS — G4733 Obstructive sleep apnea (adult) (pediatric): Secondary | ICD-10-CM | POA: Diagnosis not present

## 2021-06-16 MED ORDER — RIVAROXABAN 20 MG PO TABS: 20.0000 mg | ORAL_TABLET | Freq: Every day | ORAL | 3 refills | Status: DC

## 2021-06-16 NOTE — Assessment & Plan Note (Signed)
Patient education given on sleep apnea.  CPAP compliance encouraged ?- discussed how weight can impact sleep and risk for sleep disordered breathing ?- discussed options to assist with weight loss: combination of diet modification, cardiovascular and strength training exercises ?  ?- had an extensive discussion regarding the adverse health consequences related to untreated sleep disordered breathing ?- specifically discussed the risks for hypertension, coronary artery disease, cardiac dysrhythmias, cerebrovascular disease, and diabetes ?- lifestyle modification discussed ?  ?- discussed how sleep disruption can increase risk of accidents, particularly when driving ?- safe driving practices were discussed ?  ? ?Plan  ?Patient Instructions  ?Continue on Xarelto 20mg  daily.  ?Avoid NSAID meds , (advil, motrin, etc)  ? ?Continue on Flutter valve for cough/congestion .  ?Albuterol inhaler As needed   ?Advance activity as tolerated.  ? ?Wear CPAP At bedtime  all night long  ?Saline nasal spray and gel As needed   ?Wear for at least 4-6 hrs or more each night .  ?Do not drive if sleepy .  ? ?Finish Augmentin as directed.  ?Mucinex DM Twice daily  As needed   ? ?Follow up with Dr.  next month with chest xray in [place] office and As needed   ?Please contact office for sooner follow up if symptoms do not improve or worsen or seek emergency care  ? ? ?  ? ?

## 2021-06-16 NOTE — Assessment & Plan Note (Signed)
Right lower lobe pneumonia.  Patient is much improved with antibiotics.  We will check chest x-ray next month on return visit.  She is to finish up her antibiotic course ? ?Plan  ?Patient Instructions  ?Continue on Xarelto 20mg  daily.  ?Avoid NSAID meds , (advil, motrin, etc)  ? ?Continue on Flutter valve for cough/congestion .  ?Albuterol inhaler As needed   ?Advance activity as tolerated.  ? ?Wear CPAP At bedtime  all night long  ?Saline nasal spray and gel As needed   ?Wear for at least 4-6 hrs or more each night .  ?Do not drive if sleepy .  ? ?Finish Augmentin as directed.  ?Mucinex DM Twice daily  As needed   ? ?Follow up with Dr.  next month with chest xray in [place] office and As needed   ?Please contact office for sooner follow up if symptoms do not improve or worsen or seek emergency care  ? ? ?  ? ?

## 2021-06-16 NOTE — Progress Notes (Signed)
@Patient  ID: Hailey Ross, female    DOB: 1970-07-12, 51 y.o.   MRN: [id number]  Chief Complaint  Patient presents with   Follow-up    Referring provider: Renee Rival, FNP  HPI: 51 year old female followed for pulmonary sarcoidosis, bronchiectasis and obstructive sleep apnea.  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   TEST/EVENTS :  Labs 04/21/15 >> HIV negative, Quantiferon gold negative, ACE 195, ANA, RF negative, ESR 32     Chest imaging:  CT chest 03/18/15 >> biapical thickening with traction BTX, BTX RML and lingula, subcarinal LAN 1.7 cm, patchy increased interstitial markings   HRCT chest 03/07/18 >> borderline LAN, patchy GGO, septal thickening, architectural distortion, cylindrical and varicose BTX    CT chest June 18, 2019 showed peripheral segmental and subsegmental bilateral pulmonary emboli, bulky mediastinal and hilar lymph nodes, bilateral apical and lingular honeycombing and bilateral upper lobe mild traction bronchiectasis.  Nonspecific groundglass opacities in the left base and right middle lobe.  Not significantly changed   Venous Dopplers were negative for DVT.     2D echo showed a preserved EF.  Mildly reduced right ventricular systolic function and moderately elevated pulmonary artery systolic pressure at 57 mmHg.  Moderate grade 3 protruding plaque involving the distal aortic arch and proximal descending aorta   Echo 09/2019    Home sleep study was done July 22, 2019.  This showed mild sleep apnea with an AHI at 9.5/hour and SPO2 low at 74%   PFT  PFT 05/26/15 >> FEV1 1.67 (66%), FEV1% 88, TLC 2/78 (53%), DLCO 36%   09/26/2019-pulmonary function test-FEV1 54%, ratio 91, FVC 47%, DLCO 45%  06/16/2021 Follow up : Sarcoidosis and obstructive sleep apnea Patient returns for a 63-monthfollow-up.  Patient has underlying  sarcoidosis and bronchiectasis.  Patient says she was recently sick last month and went to the emergency room she had increased cough and 1 episode of blood-tinged mucus.  She was diagnosed with community-acquired pneumonia.  And given a 10-day course of Augmentin chest x-ray showed a focal opacity in the right lower lobe. Since emergency room visit patient says she is feeling better.  Cough and congestion have decreased.  Her hemoptysis has resolved over the last week.  She has 2 additional days of Augmentin left.  Appetite is good with no nausea vomiting or diarrhea.  Patient has a history of unprovoked PE and is on lifelong anticoagulation.  She remains on Xarelto 20 mg daily.  Patient says she is doing well with no known bleeding.  Patient education given on anticoagulation.  Patient is aware to avoid nonsteroidal products.  Patient has underlying obstructive sleep apnea.  She is on nocturnal CPAP.  Patient says that she tries to wear CPAP but misses some nights.  CPAP download shows minimum usage which only 16% usage.  Daily average usage at 5.5 hours.  Patient is on auto CPAP 5 to 15 cm H2O.  AHI 0.3.  We discussed CPAP compliance.    No Known Allergies  Immunization History  Administered Date(s) Administered   Influenza Inj Mdck Quad Pf 12/25/2019   Influenza,inj,Quad PF,6+ Mos 12/21/2018, 02/22/2021   Influenza,inj,Quad PF,6-35 Mos 01/10/2019   PFIZER(Purple Top)SARS-COV-2 Vaccination 08/14/2019, 09/10/2019, 02/07/2020   Pneumococcal Polysaccharide-23 02/12/2014   Tdap 02/12/2014    Past Medical History:  Diagnosis Date   Acute cholecystitis 08/24/2016   Acute kidney injury superimposed on chronic  kidney disease ([place]) 06/20/2019   Acute respiratory failure with hypoxia ([place]) 06/19/2019   Annual visit for general adult medical examination with abnormal findings 04/16/2019   Arthritis    right knee   Bronchiectasis ([place])    CKD (chronic kidney disease)    Iron deficiency     Lightheadedness 05/10/2019   PE (pulmonary thromboembolism) ([place]) 06/2019   Pre-syncope 04/16/2019   Sarcoidosis    Sarcoidosis    Sleep apnea    c pap   Smoker 02/12/2014    Tobacco History: Social History   Tobacco Use  Smoking Status Former   Packs/day: 1.00   Years: 20.00   Pack years: 20.00   Types: Cigarettes   Quit date: 03/21/2015   Years since quitting: 6.2   Passive exposure: Past  Smokeless Tobacco Never   Counseling given: Not Answered   Outpatient Medications Prior to Visit  Medication Sig Dispense Refill   acetaminophen (TYLENOL) 500 MG tablet Take 500 mg by mouth every 4 (four) hours as needed for mild pain, moderate pain or headache.      albuterol (PROVENTIL) (2.5 MG/3ML) 0.083% nebulizer solution Take 3 mLs (2.5 mg total) by nebulization every 6 (six) hours as needed for wheezing or shortness of breath. 75 mL 1   albuterol (VENTOLIN HFA) 108 (90 Base) MCG/ACT inhaler INHALE 2 PUFFS INTO THE LUNGS EVERY 6 HOURS AS NEEDED FOR WHEEZING OR SHORTNESS OF BREATH (Patient taking differently: Inhale 1-2 puffs into the lungs every 6 (six) hours as needed for wheezing or shortness of breath.) 6.7 g 5   amoxicillin-clavulanate (AUGMENTIN) 875-125 MG tablet Take 1 tablet by mouth 2 (two) times daily. 20 tablet 0   diltiazem (CARDIZEM) 30 MG tablet TAKE 1 TABLET(30 MG) BY MOUTH TWICE DAILY (Patient taking differently: Take 30 mg by mouth 2 (two) times daily.) 180 tablet 3   lisinopril (ZESTRIL) 5 MG tablet Take 5 mg by mouth daily.     LORazepam (ATIVAN) 0.5 MG tablet Take 0.5 mg by mouth daily as needed.     pantoprazole (PROTONIX) 40 MG tablet Take 1 tablet (40 mg total) by mouth daily. 90 tablet 3   prazosin (MINIPRESS) 1 MG capsule Take 1 mg by mouth 2 (two) times daily.     rosuvastatin (CRESTOR) 5 MG tablet Take 1 tablet (5 mg total) by mouth daily. 90 tablet 1   traZODone (DESYREL) 50 MG tablet Take 50-100 mg by mouth at bedtime.     Vitamin D, Ergocalciferol, (DRISDOL)  1.25 MG (50000 UNIT) CAPS capsule Take 1 capsule (50,000 Units total) by mouth every 7 (seven) days. 12 capsule 1   zolpidem (AMBIEN) 10 MG tablet Take 10 mg by mouth at bedtime as needed.     XARELTO 20 MG TABS tablet TAKE 1 TABLET(20 MG) BY MOUTH DAILY WITH SUPPER (Patient taking differently: Take 20 mg by mouth daily with supper.) 90 tablet 2   No facility-administered medications prior to visit.     Review of Systems:   Constitutional:   No  weight loss, night sweats,  Fevers, chills, + fatigue, or  lassitude.  HEENT:   No headaches,  Difficulty swallowing,  Tooth/dental problems, or  Sore throat,                No sneezing, itching, ear ache, nasal congestion, post nasal drip,   CV:  No chest pain,  Orthopnea, PND, swelling in lower extremities, anasarca, dizziness, palpitations, syncope.   GI  No heartburn, indigestion, abdominal pain,  nausea, vomiting, diarrhea, change in bowel habits, loss of appetite, bloody stools.   Resp:   No excess mucus, no productive cough,  No non-productive cough,  No coughing up of blood.  No change in color of mucus.  No wheezing.  No chest wall deformity  Skin: no rash or lesions.  GU: no dysuria, change in color of urine, no urgency or frequency.  No flank pain, no hematuria   MS:  No joint pain or swelling.  No decreased range of motion.  No back pain.    Physical Exam  BP 130/82    Pulse (!) 59    Ht 5' 5"  (1.651 m)    Wt 215 lb 9.6 oz (97.8 kg)    SpO2 95%    BMI 35.88 kg/m   GEN: A/Ox3; pleasant , NAD, well nourished    HEENT:  [place]/AT,   NOSE-clear, THROAT-clear, no lesions, no postnasal drip or exudate noted.   NECK:  Supple w/ fair ROM; no JVD; normal carotid impulses w/o bruits; no thyromegaly or nodules palpated; no lymphadenopathy.    RESP  Clear  P & A; w/o, wheezes/ rales/ or rhonchi. no accessory muscle use, no dullness to percussion  CARD:  RRR, no m/r/g, no peripheral edema, pulses intact, no cyanosis or clubbing.  GI:    Soft & nt; nml bowel sounds; no organomegaly or masses detected.   Musco: Warm bil, no deformities or joint swelling noted.   Neuro: alert, no focal deficits noted.    Skin: Warm, no lesions or rashes    Lab Results:  CBC    Component Value Date/Time   WBC 8.6 04/20/2021 1509   RBC 4.67 04/20/2021 1509   HGB 12.5 04/20/2021 1509   HGB 12.7 12/28/2020 1126   HCT 39.3 04/20/2021 1509   HCT 38.5 12/28/2020 1126   PLT 329.0 04/20/2021 1509   PLT 328 12/28/2020 1126   MCV 84.2 04/20/2021 1509   MCV 82 12/28/2020 1126   MCH 27.1 12/28/2020 1126   MCH 27.0 12/26/2020 2220   MCHC 31.9 04/20/2021 1509   RDW 13.7 04/20/2021 1509   RDW 13.1 12/28/2020 1126   LYMPHSABS 3.0 04/20/2021 1509   LYMPHSABS 4.0 (H) 12/28/2020 1126   MONOABS 0.6 04/20/2021 1509   EOSABS 0.2 04/20/2021 1509   EOSABS 0.1 12/28/2020 1126   BASOSABS 0.0 04/20/2021 1509   BASOSABS 0.0 12/28/2020 1126    BMET    Component Value Date/Time   NA 137 04/20/2021 1509   K 4.2 04/20/2021 1509   CL 105 04/20/2021 1509   CO2 28 04/20/2021 1509   GLUCOSE 99 04/20/2021 1509   BUN 13 04/20/2021 1509   CREATININE 1.19 04/20/2021 1509   CREATININE 1.19 (H) 09/23/2020 1458   CALCIUM 9.4 04/20/2021 1509   GFRNONAA 57 (L) 12/26/2020 2220   GFRNONAA 53 (L) 09/23/2020 1458   GFRAA 62 09/23/2020 1458    BNP No results found for: BNP  ProBNP No results found for: PROBNP  Imaging: DG Chest 2 View  Result Date: 06/07/2021 CLINICAL DATA:  Cough EXAM: CHEST - 2 VIEW COMPARISON:  Chest x-ray 03/15/2021 FINDINGS: Heart size is normal. Mediastinum appears stable and within normal limits. Calcified plaques in the aortic arch. Extensive prominent interstitial lung markings bilaterally mostly near the apices and in the right lower lung zone. New focal opacity in the right lower lung zone since previous study. No pleural effusion or pneumothorax. IMPRESSION: 1. Focal opacity in the right lower lung zone which  is new since  previous study and could represent pneumonia. Recommend follow-up until resolution to exclude possible neoplasm. 2. Chronic interstitial lung disease. Electronically Signed   By: Ofilia Neas M.D.   On: 06/07/2021 16:38    lidocaine (XYLOCAINE) 1 % (with pres) injection 1.5 mL     Date Action Dose Route User   06/09/2021 0905 Given 1.5 mL Other (Left Knee) Ofilia Neas, PA-C      methylPREDNISolone acetate (DEPO-MEDROL) injection 80 mg     Date Action Dose Route User   Discharged on 06/07/2021   Admitted on 06/07/2021   04/22/2021 1504 Given 80 mg Intramuscular (Right Upper Outer Quadrant) Glee Arvin D, CMA      triamcinolone acetonide [facility]) injection 40 mg     Date Action Dose Route User   06/09/2021 0905 Given 40 mg Intra-articular (Left Knee) Ofilia Neas, PA-C       PFT Results Latest Ref Rng & Units 09/26/2019 05/26/2015  FVC-Pre L - 1.78  FVC-Predicted Pre % 47 57  FVC-Post L 1.39 1.88  FVC-Predicted Post % 45 60  Pre FEV1/FVC % % 91 85  Post FEV1/FCV % % 85 88  FEV1-Pre L 1.33 1.51  FEV1-Predicted Pre % 54 59  FEV1-Post L 1.18 1.67  DLCO uncorrected ml/min/mmHg 9.96 9.41  DLCO UNC% % 45 36  DLCO corrected ml/min/mmHg 10.18 9.38  DLCO COR %Predicted % 46 36  DLVA Predicted % 105 73  TLC L 3.17 2.78  TLC % Predicted % 61 53  RV % Predicted % 89 44    No results found for: NITRICOXIDE      Assessment & Plan:   CAP (community acquired pneumonia) Right lower lobe pneumonia.  Patient is much improved with antibiotics.  We will check chest x-ray next month on return visit.  She is to finish up her antibiotic course  Plan  Patient Instructions  Continue on Xarelto 38m daily.  Avoid NSAID meds , (advil, motrin, etc)   Continue on Flutter valve for cough/congestion .  Albuterol inhaler As needed   Advance activity as tolerated.   Wear CPAP At bedtime  all night long  Saline nasal spray and gel As needed   Wear for at least 4-6 hrs or  more each night .  Do not drive if sleepy .   Finish Augmentin as directed.  Mucinex DM Twice daily  As needed    Follow up with Dr. [place]alford Chessman [place]   Please contact office for sooner follow up if symptoms do not improve or worsen or seek emergency care        OSA (obstructive sleep apnea) Patient education given on sleep apnea.  CPAP compliance encouraged - discussed how weight can impact sleep and risk for sleep disordered breathing - discussed options to assist with weight loss: combination of diet modification, cardiovascular and strength training exercises   - had an extensive discussion regarding the adverse health consequences related to untreated sleep disordered breathing - specifically discussed the risks for hypertension, coronary artery disease, cardiac dysrhythmias, cerebrovascular disease, and diabetes - lifestyle modification discussed   - discussed how sleep disruption can increase risk of accidents, particularly when driving - safe driving practices were discussed    Plan  Patient Instructions  Continue on Xarelto 270mdaily.  Avoid NSAID meds , (advil, motrin, etc)   Continue on Flutter valve for cough/congestion .  Albuterol inhaler As needed  Advance activity as tolerated.   Wear CPAP At bedtime  all night long  Saline nasal spray and gel As needed   Wear for at least 4-6 hrs or more each night .  Do not drive if sleepy .   Finish Augmentin as directed.  Mucinex DM Twice daily  As needed    Follow up with Dr. Halford Chessman  next month with chest xray in [place] office and As needed   Please contact office for sooner follow up if symptoms do not improve or worsen or seek emergency care        Sarcoidosis of lung ([place]) Compensated on present regimen.  Plan  Patient Instructions  Continue on Xarelto 30m daily.  Avoid NSAID meds , (advil, motrin, etc)   Continue on Flutter valve for  cough/congestion .  Albuterol inhaler As needed   Advance activity as tolerated.   Wear CPAP At bedtime  all night long  Saline nasal spray and gel As needed   Wear for at least 4-6 hrs or more each night .  Do not drive if sleepy .   Finish Augmentin as directed.  Mucinex DM Twice daily  As needed    Follow up with Dr. [place]alford Chessman [place]   Please contact office for sooner follow up if symptoms do not improve or worsen or seek emergency care          TRexene Edison NP 06/16/2021

## 2021-06-16 NOTE — Progress Notes (Signed)
Reviewed and agree with assessment/plan. ? ? ?Chesley Mires, MD ?[place] ?06/16/2021, 11:11 AM ?Pager:  [phone] ? ?

## 2021-06-16 NOTE — Assessment & Plan Note (Signed)
Compensated on present regimen. ? ?Plan  ?Patient Instructions  ?Continue on Xarelto 20mg  daily.  ?Avoid NSAID meds , (advil, motrin, etc)  ? ?Continue on Flutter valve for cough/congestion .  ?Albuterol inhaler As needed   ?Advance activity as tolerated.  ? ?Wear CPAP At bedtime  all night long  ?Saline nasal spray and gel As needed   ?Wear for at least 4-6 hrs or more each night .  ?Do not drive if sleepy .  ? ?Finish Augmentin as directed.  ?Mucinex DM Twice daily  As needed   ? ?Follow up with Dr.  next month with chest xray in [place] office and As needed   ?Please contact office for sooner follow up if symptoms do not improve or worsen or seek emergency care  ? ? ?  ? ?

## 2021-06-16 NOTE — Patient Instructions (Addendum)
Continue on Xarelto 20mg  daily.  ?Avoid NSAID meds , (advil, motrin, etc)  ? ?Continue on Flutter valve for cough/congestion .  ?Albuterol inhaler As needed   ?Advance activity as tolerated.  ? ?Wear CPAP At bedtime  all night long  ?Saline nasal spray and gel As needed   ?Wear for at least 4-6 hrs or more each night .  ?Do not drive if sleepy .  ? ?Finish Augmentin as directed.  ?Mucinex DM Twice daily  As needed   ? ?Follow up with Dr.  next month with chest xray in [place] office and As needed   ?Please contact office for sooner follow up if symptoms do not improve or worsen or seek emergency care  ? ? ?

## 2021-06-25 ENCOUNTER — Other Ambulatory Visit: Payer: Self-pay

## 2021-06-25 ENCOUNTER — Ambulatory Visit
Admission: RE | Admit: 2021-06-25 | Discharge: 2021-06-25 | Disposition: A | Discharge status: Home / Self Care | Payer: 59 | Source: Ambulatory Visit | Attending: Urgent Care | Admitting: Urgent Care

## 2021-06-25 VITALS — BP 111/79 | HR 67 | Temp 97.9°F | Resp 20

## 2021-06-25 DIAGNOSIS — N898 Other specified noninflammatory disorders of vagina: Secondary | ICD-10-CM | POA: Diagnosis present

## 2021-06-25 DIAGNOSIS — N760 Acute vaginitis: Secondary | ICD-10-CM | POA: Insufficient documentation

## 2021-06-25 LAB — POCT URINALYSIS DIP (MANUAL ENTRY)
Bilirubin, UA: NEGATIVE
Blood, UA: NEGATIVE
Glucose, UA: NEGATIVE mg/dL
Ketones, POC UA: NEGATIVE mg/dL
Leukocytes, UA: NEGATIVE
Nitrite, UA: NEGATIVE
Protein Ur, POC: NEGATIVE mg/dL
Spec Grav, UA: 1.02 (ref 1.010–1.025)
Urobilinogen, UA: 0.2 E.U./dL
pH, UA: 6 (ref 5.0–8.0)

## 2021-06-25 MED ORDER — FLUCONAZOLE 150 MG PO TABS: 150.0000 mg | ORAL_TABLET | ORAL | 0 refills | Status: DC

## 2021-06-25 NOTE — ED Triage Notes (Signed)
Pt presents with c/o burning with urination but pt believes that she may have yeast infection from recent antibiotics, denies vaginal discharge. Is not concerned for std , pt is not sexually active   ?

## 2021-06-25 NOTE — ED Provider Notes (Signed)
?[facility] ? ? ?MRN: [id number] DOB: 07/13/1970 ? ?Subjective:  ? ?Hailey Ross is a 51 y.o. female presenting for vaginal burning, vaginal irritation and slight discharge.  Patient is worried about having a yeast infection.  I did treat her for pneumonia at the end of February extending into the beginning of March.  She has had symptoms for the past week.  Denies fever, nausea, vomiting, pelvic or abdominal pain, hematuria, genital rash.  No concern for STI and does not want to be tested for this. ? ?No current facility-administered medications for this encounter. ? ?Current Outpatient Medications:  ?  acetaminophen (TYLENOL) 500 MG tablet, Take 500 mg by mouth every 4 (four) hours as needed for mild pain, moderate pain or headache. , Disp: , Rfl:  ?  albuterol (PROVENTIL) (2.5 MG/3ML) 0.083% nebulizer solution, Take 3 mLs (2.5 mg total) by nebulization every 6 (six) hours as needed for wheezing or shortness of breath., Disp: 75 mL, Rfl: 1 ?  albuterol (VENTOLIN HFA) 108 (90 Base) MCG/ACT inhaler, INHALE 2 PUFFS INTO THE LUNGS EVERY 6 HOURS AS NEEDED FOR WHEEZING OR SHORTNESS OF BREATH (Patient taking differently: Inhale 1-2 puffs into the lungs every 6 (six) hours as needed for wheezing or shortness of breath.), Disp: 6.7 g, Rfl: 5 ?  amoxicillin-clavulanate (AUGMENTIN) 875-125 MG tablet, Take 1 tablet by mouth 2 (two) times daily., Disp: 20 tablet, Rfl: 0 ?  diltiazem (CARDIZEM) 30 MG tablet, TAKE 1 TABLET(30 MG) BY MOUTH TWICE DAILY (Patient taking differently: Take 30 mg by mouth 2 (two) times daily.), Disp: 180 tablet, Rfl: 3 ?  lisinopril (ZESTRIL) 5 MG tablet, Take 5 mg by mouth daily., Disp: , Rfl:  ?  LORazepam (ATIVAN) 0.5 MG tablet, Take 0.5 mg by mouth daily as needed., Disp: , Rfl:  ?  pantoprazole (PROTONIX) 40 MG tablet, Take 1 tablet (40 mg total) by mouth daily., Disp: 90 tablet, Rfl: 3 ?  prazosin (MINIPRESS) 1 MG capsule, Take 1 mg by mouth 2 (two) times daily., Disp: ,  Rfl:  ?  rivaroxaban (XARELTO) 20 MG TABS tablet, Take 1 tablet (20 mg total) by mouth daily with supper., Disp: 90 tablet, Rfl: 3 ?  rosuvastatin (CRESTOR) 5 MG tablet, Take 1 tablet (5 mg total) by mouth daily., Disp: 90 tablet, Rfl: 1 ?  traZODone (DESYREL) 50 MG tablet, Take 50-100 mg by mouth at bedtime., Disp: , Rfl:  ?  Vitamin D, Ergocalciferol, (DRISDOL) 1.25 MG (50000 UNIT) CAPS capsule, Take 1 capsule (50,000 Units total) by mouth every 7 (seven) days., Disp: 12 capsule, Rfl: 1 ?  zolpidem (AMBIEN) 10 MG tablet, Take 10 mg by mouth at bedtime as needed., Disp: , Rfl:   ? ?No Known Allergies ? ?Past Medical History:  ?Diagnosis Date  ? Acute cholecystitis 08/24/2016  ? Acute kidney injury superimposed on chronic kidney disease (HCC) 06/20/2019  ? Acute respiratory failure with hypoxia (HCC) 06/19/2019  ? Annual visit for general adult medical examination with abnormal findings 04/16/2019  ? Arthritis   ? right knee  ? Bronchiectasis (HCC)   ? CKD (chronic kidney disease)   ? Iron deficiency   ? Lightheadedness 05/10/2019  ? PE (pulmonary thromboembolism) (HCC) 06/2019  ? Pre-syncope 04/16/2019  ? Sarcoidosis   ? Sarcoidosis   ? Sleep apnea   ? c pap  ? Smoker 02/12/2014  ?  ? ?Past Surgical History:  ?Procedure Laterality Date  ? CHOLECYSTECTOMY N/A 08/25/2016  ? Procedure: LAPAROSCOPIC CHOLECYSTECTOMY;  Surgeon:  Abigail Miyamoto, MD;  Location: [facility] OR;  Service: General;  Laterality: N/A;  ? TUBAL LIGATION  02/1993  ? ? ?Family History  ?Problem Relation Age of Onset  ? Hypertension Mother   ? Cancer Mother   ? Colon polyps Mother   ? Healthy Sister   ? Healthy Sister   ? Healthy Sister   ? Healthy Sister   ? Healthy Son   ? Healthy Son   ? Healthy Daughter   ? Colon cancer Neg Hx   ? Esophageal cancer Neg Hx   ? Rectal cancer Neg Hx   ? Stomach cancer Neg Hx   ? ? ?Social History  ? ?Tobacco Use  ? Smoking status: Former  ?  Packs/day: 1.00  ?  Years: 20.00  ?  Pack years: 20.00  ?  Types: Cigarettes  ?  Quit  date: 03/21/2015  ?  Years since quitting: 6.2  ?  Passive exposure: Past  ? Smokeless tobacco: Never  ?Vaping Use  ? Vaping Use: Never used  ?Substance Use Topics  ? Alcohol use: Yes  ?  Alcohol/week: 0.0 standard drinks  ?  Comment: Rare  ? Drug use: Never  ? ? ?ROS ? ? ?Objective:  ? ?Vitals: ?BP 111/79   Pulse 67   Temp 97.9 ?F (36.6 ?C)   Resp 20   SpO2 95%  ? ?Physical Exam ?Constitutional:   ?   General: She is not in acute distress. ?   Appearance: Normal appearance. She is well-developed. She is not ill-appearing, toxic-appearing or diaphoretic.  ?HENT:  ?   Head: Normocephalic and atraumatic.  ?   Nose: Nose normal.  ?   Mouth/Throat:  ?   Mouth: Mucous membranes are moist.  ?   Pharynx: Oropharynx is clear.  ?Eyes:  ?   General: No scleral icterus.    ?   Right eye: No discharge.     ?   Left eye: No discharge.  ?   Extraocular Movements: Extraocular movements intact.  ?   Conjunctiva/sclera: Conjunctivae normal.  ?Cardiovascular:  ?   Rate and Rhythm: Normal rate.  ?Pulmonary:  ?   Effort: Pulmonary effort is normal.  ?Abdominal:  ?   General: Bowel sounds are normal. There is no distension.  ?   Palpations: Abdomen is soft. There is no mass.  ?   Tenderness: There is no abdominal tenderness. There is no right CVA tenderness, left CVA tenderness, guarding or rebound.  ?Skin: ?   General: Skin is warm and dry.  ?Neurological:  ?   General: No focal deficit present.  ?   Mental Status: She is alert and oriented to person, place, and time.  ?Psychiatric:     ?   Mood and Affect: Mood normal.     ?   Behavior: Behavior normal.     ?   Thought Content: Thought content normal.     ?   Judgment: Judgment normal.  ? ? ?Results for orders placed or performed during the hospital encounter of 06/25/21 (from the past 24 hour(s))  ?POCT urinalysis dipstick     Status: None  ? Collection Time: 06/25/21  3:36 PM  ?Result Value Ref Range  ? Color, UA yellow yellow  ? Clarity, UA clear clear  ? Glucose, UA negative  negative mg/dL  ? Bilirubin, UA negative negative  ? Ketones, POC UA negative negative mg/dL  ? Spec Grav, UA 1.020 1.010 - 1.025  ? Blood, UA negative  negative  ? pH, UA 6.0 5.0 - 8.0  ? Protein Ur, POC negative negative mg/dL  ? Urobilinogen, UA 0.2 0.2 or 1.0 E.U./dL  ? Nitrite, UA Negative Negative  ? Leukocytes, UA Negative Negative  ? ? ?Assessment and Plan :  ? ?PDMP not reviewed this encounter. ? ?1. Acute vaginitis   ?2. Vaginal itching   ? ?We will treat empirically for yeast vaginitis with fluconazole.  Testing pending.  Recommended supportive care otherwise. Counseled patient on potential for adverse effects with medications prescribed/recommended today, ER and return-to-clinic precautions discussed, patient verbalized understanding. ? ?  ?Wallis Bamberg, PA-C ?06/25/21 1607 ? ?

## 2021-06-28 ENCOUNTER — Telehealth: Payer: Self-pay | Admitting: *Deleted

## 2021-06-28 LAB — CERVICOVAGINAL ANCILLARY ONLY
Bacterial Vaginitis (gardnerella): NEGATIVE
Candida Glabrata: NEGATIVE
Candida Vaginitis: POSITIVE — AB
Comment: NEGATIVE
Comment: NEGATIVE
Comment: NEGATIVE

## 2021-06-28 NOTE — Telephone Encounter (Signed)
Mychart message: ? ?Just a lil concerned with oxygen levels dropping some when I moving around. Just a little tightness in my chest when I take deep breaths. Made appointment for next Tuesday but wanted to see if it was anything Hailey Ross wants me to do. ? ?Called and spoke with patient, she states her sats drop to 78%-85% (denies sob with sat this low) and then after she rests it comes back to 92-95%.  She states that her hands are cold at times (hands were cold x2) and they may have been cold when it was in the 70%.  Levels drop when making her bed or doing laundry or cooking.  She states it does not drop when walking short distances.  Cough has improved, it is now a dry hacking cough like she has with sarcoid.  Has used albuterol inhaler a couple of times with little relief.  Advised to make sure her hands are warm when she checks her sats otherwise they are inaccurate.  Advised to keep an eye on her oxygen levels and her sob and to call us back if anything changes and to see emergency care if her sats are in the 70% with warm fingers and she is sob.  She verbalized understanding.  If no changes, she will see Hailey Oaks NP on Tuesday, July 06, 2021.  Nothing further needed. ?

## 2021-07-03 ENCOUNTER — Emergency Department (HOSPITAL_COMMUNITY)
Admission: EM | Admit: 2021-07-03 | Discharge: 2021-07-03 | Disposition: A | Discharge status: Home / Self Care | Payer: 59 | Attending: Emergency Medicine | Admitting: Emergency Medicine

## 2021-07-03 ENCOUNTER — Encounter (HOSPITAL_COMMUNITY): Payer: Self-pay | Admitting: Emergency Medicine

## 2021-07-03 ENCOUNTER — Emergency Department (HOSPITAL_COMMUNITY): Payer: 59

## 2021-07-03 ENCOUNTER — Encounter: Payer: Self-pay | Admitting: Cardiology

## 2021-07-03 ENCOUNTER — Other Ambulatory Visit: Payer: Self-pay

## 2021-07-03 DIAGNOSIS — R0789 Other chest pain: Secondary | ICD-10-CM | POA: Insufficient documentation

## 2021-07-03 DIAGNOSIS — R002 Palpitations: Secondary | ICD-10-CM | POA: Diagnosis present

## 2021-07-03 DIAGNOSIS — Z7901 Long term (current) use of anticoagulants: Secondary | ICD-10-CM | POA: Insufficient documentation

## 2021-07-03 DIAGNOSIS — N189 Chronic kidney disease, unspecified: Secondary | ICD-10-CM | POA: Diagnosis not present

## 2021-07-03 DIAGNOSIS — R079 Chest pain, unspecified: Secondary | ICD-10-CM

## 2021-07-03 LAB — CBC WITH DIFFERENTIAL/PLATELET
Abs Immature Granulocytes: 0 10*3/uL (ref 0.00–0.07)
Basophils Absolute: 0 10*3/uL (ref 0.0–0.1)
Basophils Relative: 0 %
Eosinophils Absolute: 0.1 10*3/uL (ref 0.0–0.5)
Eosinophils Relative: 1 %
HCT: 38.2 % (ref 36.0–46.0)
Hemoglobin: 12.2 g/dL (ref 12.0–15.0)
Lymphocytes Relative: 44 %
Lymphs Abs: 5.5 10*3/uL — ABNORMAL HIGH (ref 0.7–4.0)
MCH: 27.7 pg (ref 26.0–34.0)
MCHC: 31.9 g/dL (ref 30.0–36.0)
MCV: 86.6 fL (ref 80.0–100.0)
Monocytes Absolute: 0.6 10*3/uL (ref 0.1–1.0)
Monocytes Relative: 5 %
Neutro Abs: 6.3 10*3/uL (ref 1.7–7.7)
Neutrophils Relative %: 50 %
Platelets: 275 10*3/uL (ref 150–400)
RBC: 4.41 MIL/uL (ref 3.87–5.11)
RDW: 13.6 % (ref 11.5–15.5)
WBC: 12.5 10*3/uL — ABNORMAL HIGH (ref 4.0–10.5)
nRBC: 0 % (ref 0.0–0.2)
nRBC: 0 /100 WBC

## 2021-07-03 LAB — BASIC METABOLIC PANEL
Anion gap: 6 (ref 5–15)
BUN: 18 mg/dL (ref 6–20)
CO2: 24 mmol/L (ref 22–32)
Calcium: 9.2 mg/dL (ref 8.9–10.3)
Chloride: 109 mmol/L (ref 98–111)
Creatinine, Ser: 1.41 mg/dL — ABNORMAL HIGH (ref 0.44–1.00)
GFR, Estimated: 45 mL/min — ABNORMAL LOW (ref >60)
Glucose, Bld: 119 mg/dL — ABNORMAL HIGH (ref 70–99)
Potassium: 3.7 mmol/L (ref 3.5–5.1)
Sodium: 139 mmol/L (ref 135–145)

## 2021-07-03 LAB — TROPONIN I (HIGH SENSITIVITY)
Troponin I (High Sensitivity): 9 ng/L (ref <18)
Troponin I (High Sensitivity): 9 ng/L (ref <18)

## 2021-07-03 NOTE — ED Triage Notes (Signed)
Pt arrive POV with mid cp since yesterday and palpitations ?

## 2021-07-03 NOTE — ED Provider Triage Note (Signed)
Emergency Medicine Provider Triage Evaluation Note ? ?Hailey Ross , a 51 y.o. female  was evaluated in triage.  Pt complains of chest pain and palpitations.  States she was woken up out of sleep with a heart rate of 140.  She has had this previously and has worn Holter monitors.  No definitive diagnosis.  States palpitations have improved however she now has a some left-sided aching chest pain.  Does not radiate to left arm, left back or jaw.  She does feel mildly short of breath and feels like she has a dry scratchy throat.  No cough.  Has history of PE on Xarelto.  Has not missed any doses.  No lower extremity swelling, pain.  No fever, cough ? ?Review of Systems  ?Positive: Palpitations, chest pain ?Negative: Fever, cough, syncope ? ?Physical Exam  ?BP 119/83 (BP Location: Left Arm)   Pulse 68   Temp 99.1 ?F (37.3 ?C) (Oral)   Resp 18   SpO2 98%  ?Gen:   Awake, no distress   ?Resp:  Normal effort  ?MSK:   Moves extremities without difficulty  ?Other:   ? ?Medical Decision Making  ?Medically screening exam initiated at 2:48 AM.  Appropriate orders placed.  JALIAH FOODY was informed that the remainder of the evaluation will be completed by another provider, this initial triage assessment does not replace that evaluation, and the importance of remaining in the ED until their evaluation is complete. ? ?Chest pain, palpitations ?  ?Emmette Katt A, PA-C ?07/03/21 0249 ? ?

## 2021-07-03 NOTE — Discharge Instructions (Signed)
Please touch base with your cardiologist to discuss your most recent episodes of palpitations.  You are already on a medicine that helps slow down your heart rate and may be helpful to discuss with an whether they want to do any another cardiac monitor or increase her medications or simply continue on this present course since you have had very few episodes of palpitations recently. ? ?Please return to the emergency room for any new or concerning symptoms such as coughing up blood, chest pain, difficulty breathing, any other new or concerning symptoms. ?

## 2021-07-03 NOTE — ED Provider Notes (Signed)
?[facility] ?Provider Note ? ? ?CSN: [id number] ?Arrival date & time: 07/03/21  [id number] ? ?  ? ?History ? ?Chief Complaint  ?Patient presents with  ? Chest Pain  ? ? ?Hailey Ross is a 51 y.o. female. ? ? ?Chest Pain ?Patient is a 51 year old female with a past medical history significant for sarcoidosis, PEs on Xarelto, CKD, lightheadedness, presyncope, bronchiectasis ? ?She is presented to the ER today primarily complaining of palpitations.  She states that she felt somewhat nauseous 2 days ago and had some palpitations yesterday morning all day prompting her to come to the emergency room.  She arrived at approximately 3 AM in the morning has been in the emergency room for 7 hours.  She states that she has no pain in her chest and does not feel lightheaded or dizzy at least no more than usual.  She has a long history of palpitation she is on Cardizem which she states that she is taking as prescribed.  She states that when she had a PE she had hemoptysis and chest discomfort neither of which she is describing now. ? ?She does state that she had an episode while she was in triage having some chest pressure but that is resolved. ? ?She has also not missed any doses of her anticoagulants. ? ?No recent surgeries, hospitalization, long travel, hemoptysis, estrogen containing OCP, cancer history.  No unilateral leg swelling.   ? ?Seems that her smart watch showed a rate of approximately 145 when she was having symptoms.  She has worn a monitor before of her cardiologist. ? ?  ? ?Home Medications ?Prior to Admission medications   ?Medication Sig Start Date End Date Taking? Authorizing Provider  ?acetaminophen (TYLENOL) 500 MG tablet Take 500 mg by mouth every 4 (four) hours as needed for mild pain, moderate pain or headache.     [provider]  ?albuterol (PROVENTIL) (2.5 MG/3ML) 0.083% nebulizer solution Take 3 mLs (2.5 mg total) by nebulization every 6 (six) hours as  needed for wheezing or shortness of breath. 07/22/19   Parrett, Fonnie Mu, NP  ?albuterol (VENTOLIN HFA) 108 (90 Base) MCG/ACT inhaler INHALE 2 PUFFS INTO THE LUNGS EVERY 6 HOURS AS NEEDED FOR WHEEZING OR SHORTNESS OF BREATH ?Patient taking differently: Inhale 1-2 puffs into the lungs every 6 (six) hours as needed for wheezing or shortness of breath. 07/09/20   Chesley Mires, MD  ?amoxicillin-clavulanate (AUGMENTIN) 875-125 MG tablet Take 1 tablet by mouth 2 (two) times daily. 06/07/21   Jaynee Eagles, PA-C  ?diltiazem (CARDIZEM) 30 MG tablet TAKE 1 TABLET(30 MG) BY MOUTH TWICE DAILY ?Patient taking differently: Take 30 mg by mouth 2 (two) times daily. 08/21/20   Arnoldo Lenis, MD  ?fluconazole (DIFLUCAN) 150 MG tablet Take 1 tablet (150 mg total) by mouth once a week. 06/25/21   Jaynee Eagles, PA-C  ?lisinopril (ZESTRIL) 5 MG tablet Take 5 mg by mouth daily. 03/12/21   [provider]  ?LORazepam (ATIVAN) 0.5 MG tablet Take 0.5 mg by mouth daily as needed. 01/21/21   [provider]  ?pantoprazole (PROTONIX) 40 MG tablet Take 1 tablet (40 mg total) by mouth daily. 12/29/20   Parrett, Fonnie Mu, NP  ?prazosin (MINIPRESS) 1 MG capsule Take 1 mg by mouth 2 (two) times daily. 01/07/21   [provider]  ?rivaroxaban (XARELTO) 20 MG TABS tablet Take 1 tablet (20 mg total) by mouth daily with supper. 06/16/21   Parrett, Fonnie Mu, NP  ?  rosuvastatin (CRESTOR) 5 MG tablet Take 1 tablet (5 mg total) by mouth daily. 12/22/20   Noreene Larsson, NP  ?traZODone (DESYREL) 50 MG tablet Take 50-100 mg by mouth at bedtime. 11/05/20   [provider]  ?Vitamin D, Ergocalciferol, (DRISDOL) 1.25 MG (50000 UNIT) CAPS capsule Take 1 capsule (50,000 Units total) by mouth every 7 (seven) days. 12/22/20   Noreene Larsson, NP  ?zolpidem (AMBIEN) 10 MG tablet Take 10 mg by mouth at bedtime as needed. 03/18/21   [provider]  ?   ? ?Allergies    ?Patient has no known allergies.   ? ?Review of Systems   ?Review of  Systems  ?Cardiovascular:  Positive for chest pain.  ? ?Physical Exam ?Updated Vital Signs ?BP (!) 106/59 (BP Location: Right Arm)   Pulse 60   Temp 97.8 ?F (36.6 ?C)   Resp 18   Ht 5\' 5"  (1.651 m)   Wt 97.8 kg   SpO2 100%   BMI 35.88 kg/m?  ?Physical Exam ?Vitals and nursing note reviewed.  ?Constitutional:   ?   General: She is not in acute distress. ?   Comments: Pleasant well-appearing 51 year old.  In no acute distress.  Sitting comfortably in bed.  Able answer questions appropriately follow commands. No increased work of breathing. Speaking in full sentences.   ?HENT:  ?   Head: Normocephalic and atraumatic.  ?   Nose: Nose normal.  ?Eyes:  ?   General: No scleral icterus. ?Cardiovascular:  ?   Rate and Rhythm: Normal rate and regular rhythm.  ?   Pulses: Normal pulses.  ?   Heart sounds: Normal heart sounds.  ?Pulmonary:  ?   Effort: Pulmonary effort is normal. No respiratory distress.  ?   Breath sounds: No wheezing.  ?Abdominal:  ?   Palpations: Abdomen is soft.  ?   Tenderness: There is no abdominal tenderness.  ?Musculoskeletal:  ?   Cervical back: Normal range of motion.  ?   Right lower leg: No edema.  ?   Left lower leg: No edema.  ?Skin: ?   General: Skin is warm and dry.  ?   Capillary Refill: Capillary refill takes less than 2 seconds.  ?Neurological:  ?   Mental Status: She is alert. Mental status is at baseline.  ?Psychiatric:     ?   Mood and Affect: Mood normal.     ?   Behavior: Behavior normal.  ? ? ?ED Results / Procedures / Treatments   ?Labs ?(all labs ordered are listed, but only abnormal results are displayed) ?Labs Reviewed  ?CBC WITH DIFFERENTIAL/PLATELET - Abnormal; Notable for the following components:  ?    Result Value  ? WBC 12.5 (*)   ? Lymphs Abs 5.5 (*)   ? All other components within normal limits  ?BASIC METABOLIC PANEL - Abnormal; Notable for the following components:  ? Glucose, Bld 119 (*)   ? Creatinine, Ser 1.41 (*)   ? GFR, Estimated 45 (*)   ? All other  components within normal limits  ?TROPONIN I (HIGH SENSITIVITY)  ?TROPONIN I (HIGH SENSITIVITY)  ? ? ?EKG ?None ? ?Radiology ?DG Chest 2 View ? ?Result Date: 07/03/2021 ?CLINICAL DATA:  Cardiac palpitations and chest tightness, initial encounter EXAM: CHEST - 2 VIEW COMPARISON:  06/07/2021 FINDINGS: Cardiac shadow is within normal limits. Previously seen right basilar infiltrate has resolved in the interval. Mild interstitial changes are noted. No bony abnormality is seen. IMPRESSION: Chronic interstitial  changes without acute abnormality. Electronically Signed   By: Inez Catalina M.D.   On: 07/03/2021 03:25   ? ?Procedures ?Procedures  ? ? ?Medications Ordered in ED ?Medications - No data to display ? ?ED Course/ Medical Decision Making/ A&P ?Clinical Course as of 07/03/21 0943  ?Sat Jul 03, 2021  ?[id number] Signa Kell nauseated  ?Today palpitations [WF]  ?  ?Clinical Course User Index ?[WF] Tedd Sias, [place]  ? ?                        ?Medical Decision Making ? ?This patient presents to the ED for concern of palpitations and one episode of resolved CP, this involves a number of treatment options, and is a complaint that carries with it a high risk of complications and morbidity.  The differential diagnosis includes The differential diagnosis for palpitations includes cardiac arrhythmias, PVC/PAC, ACS, Cardiomyopathy, CHF, MVP, pericarditis, valvular disease, Panic/Anxiety, Somatic disorder, ETOH, Caffeine,  Stimulant use, medication side effect, Anemia, Hyperthyroidism, pulmonary embolism. ? ? ? ?Co morbidities: ?Discussed in HPI ? ? ?Brief History: ? ?Patient is a 51 year old female with a past medical history significant for sarcoidosis, PEs on Xarelto, CKD, lightheadedness, presyncope, bronchiectasis ? ?She is presented to the ER today primarily complaining of palpitations.  She states that she felt somewhat nauseous 2 days ago and had some palpitations yesterday morning all day prompting her to come to the  emergency room.  She arrived at approximately 3 AM in the morning has been in the emergency room for 7 hours.  She states that she has no pain in her chest and does not feel lightheaded or dizzy at least no more than usual.  She h

## 2021-07-06 ENCOUNTER — Ambulatory Visit: Payer: 59 | Admitting: Adult Health

## 2021-07-06 ENCOUNTER — Encounter: Payer: Self-pay | Admitting: Cardiology

## 2021-07-06 ENCOUNTER — Ambulatory Visit (INDEPENDENT_AMBULATORY_CARE_PROVIDER_SITE_OTHER): Payer: 59 | Admitting: Cardiology

## 2021-07-06 VITALS — BP 124/85 | HR 66 | Ht 65.0 in | Wt 218.6 lb

## 2021-07-06 DIAGNOSIS — R42 Dizziness and giddiness: Secondary | ICD-10-CM | POA: Diagnosis not present

## 2021-07-06 DIAGNOSIS — R002 Palpitations: Secondary | ICD-10-CM

## 2021-07-06 MED ORDER — DILTIAZEM HCL 30 MG PO TABS: 30.0000 mg | ORAL_TABLET | Freq: Two times a day (BID) | ORAL | 3 refills | Status: DC

## 2021-07-06 NOTE — Progress Notes (Signed)
? ? ? ?Clinical Summary ?Hailey Ross is a 51 y.o.female seen today for follow up of the following medical problems.  ? ?1. Palpitations ?- ER visit 08/17/19 with tachycardia and dizziness. Reported episode of heart racing to 140s while driving. Prior feeling on uneasiness, followed by sudden palpitations. Checked HR on watch and was 144. Episodes lasted about 5 minutes ?- K 3.8 Cr 1.16 WBC 12 Hgb 12.8 Plt 444 hstrop 18-->17  Ddimer 0.49  ?  ?- 2 total episodes of palpitations. ?- first episode in 07/2019 while sleeping, awoke her from sleep.  ?- feeling of heart racing. Walked around for a few minutes, did not resolve.  ?- came to ER 07/15/19, negative workup including CT PE ?- episode lasted about 15 minutes in total ?  ?- notes on her watch HRs up and down in general ?- can have minor palpitations at times.  ?  ?- previously was drinking coffee x 1 day, sodas pepsi cans x 2, sweet tea glasses x 2-3, no energy drinks, no EtOH ?  ?  ?- 08/2019 30 day monitor, no significant arrhythmias ?- started on dilt 30mg  bid for symptoms ? rare palpitations at times, seem to be associated with her cycle. Dizziness seems to correlate with her cycle as well, dizziness not associated with palpitations.  ?  ?  ?- no recent symptoms. Has done well since starting diltiazem.  ?  ?- 10/2020 14 monitor: rare PACs, rare episodes SVT up to 12 beats, rare PVCs ?06/2021 ER visit with palpitations ?- trops neg x 2. K 3.7 EKG NSR. CXR no acute process ?- reports palpitations increased over the last 2 weeks. Started mild, increased in severity ?- Saturday more intense and longer duration.  ?- pneumonia early March. HR was 140 on apple watch ? ? ?2. Lightheadedness ?- can occur with sitting, standing. At time scan be more intense when standing for long periods of time.  ?- - seen by neurology ?EEG normal, was to have CTA but troubles scheduling ?Carotid benign, MRI benign ? ?Other medical issues not addressed this visit ?  ?2. Sarcoid/Bronchiectasis ?-  followed by pulmonary ? -no significant SOB ?  ?  ?3. CKD 2 ?- followed by Dr April ?- most recent labs show renal function has been improving.  ?  ?4. History of PE unprovoked ?- bilateral PEs in 06/2019, has been on xarelto ?- repeat CT PE 07/2019 PEs had resolved.  ?- 06/2019 echo LVEF 60-65^, mild RV dysfunction, PASP 52 at time of PE ?  ?- no bleeding on xarelto  ?  ?5. Pulmonary HTN ?- noted during echo during PE ?- repeat study shows PASP had decreased ?  ?  ?6. OSA on cpap ?- mixed compliance ?  ?  ?7. Hyperlipidmiea ?TC 230 HDL 58 TG 93 LDL 152 ?- upcomiing labs with pcp ?- compliant with statin ? ? ? ? ?Past Medical History:  ?Diagnosis Date  ? Acute cholecystitis 08/24/2016  ? Acute kidney injury superimposed on chronic kidney disease (HCC) 06/20/2019  ? Acute respiratory failure with hypoxia (HCC) 06/19/2019  ? Annual visit for general adult medical examination with abnormal findings 04/16/2019  ? Arthritis   ? right knee  ? Bronchiectasis (HCC)   ? CKD (chronic kidney disease)   ? Iron deficiency   ? Lightheadedness 05/10/2019  ? PE (pulmonary thromboembolism) (HCC) 06/2019  ? Pre-syncope 04/16/2019  ? Sarcoidosis   ? Sarcoidosis   ? Sleep apnea   ? c pap  ?  Smoker 02/12/2014  ? ? ? ?No Known Allergies ? ? ?Current Outpatient Medications  ?Medication Sig Dispense Refill  ? acetaminophen (TYLENOL) 500 MG tablet Take 500 mg by mouth every 4 (four) hours as needed for mild pain, moderate pain or headache.     ? albuterol (PROVENTIL) (2.5 MG/3ML) 0.083% nebulizer solution Take 3 mLs (2.5 mg total) by nebulization every 6 (six) hours as needed for wheezing or shortness of breath. 75 mL 1  ? albuterol (VENTOLIN HFA) 108 (90 Base) MCG/ACT inhaler INHALE 2 PUFFS INTO THE LUNGS EVERY 6 HOURS AS NEEDED FOR WHEEZING OR SHORTNESS OF BREATH (Patient taking differently: Inhale 1-2 puffs into the lungs every 6 (six) hours as needed for wheezing or shortness of breath.) 6.7 g 5  ? amoxicillin-clavulanate (AUGMENTIN) 875-125 MG  tablet Take 1 tablet by mouth 2 (two) times daily. 20 tablet 0  ? diltiazem (CARDIZEM) 30 MG tablet TAKE 1 TABLET(30 MG) BY MOUTH TWICE DAILY (Patient taking differently: Take 30 mg by mouth 2 (two) times daily.) 180 tablet 3  ? fluconazole (DIFLUCAN) 150 MG tablet Take 1 tablet (150 mg total) by mouth once a week. 2 tablet 0  ? lisinopril (ZESTRIL) 5 MG tablet Take 5 mg by mouth daily.    ? LORazepam (ATIVAN) 0.5 MG tablet Take 0.5 mg by mouth daily as needed.    ? pantoprazole (PROTONIX) 40 MG tablet Take 1 tablet (40 mg total) by mouth daily. 90 tablet 3  ? prazosin (MINIPRESS) 1 MG capsule Take 1 mg by mouth 2 (two) times daily.    ? rivaroxaban (XARELTO) 20 MG TABS tablet Take 1 tablet (20 mg total) by mouth daily with supper. 90 tablet 3  ? rosuvastatin (CRESTOR) 5 MG tablet Take 1 tablet (5 mg total) by mouth daily. 90 tablet 1  ? traZODone (DESYREL) 50 MG tablet Take 50-100 mg by mouth at bedtime.    ? Vitamin D, Ergocalciferol, (DRISDOL) 1.25 MG (50000 UNIT) CAPS capsule Take 1 capsule (50,000 Units total) by mouth every 7 (seven) days. 12 capsule 1  ? zolpidem (AMBIEN) 10 MG tablet Take 10 mg by mouth at bedtime as needed.    ? ?No current facility-administered medications for this visit.  ? ? ? ?Past Surgical History:  ?Procedure Laterality Date  ? CHOLECYSTECTOMY N/A 08/25/2016  ? Procedure: LAPAROSCOPIC CHOLECYSTECTOMY;  Surgeon: Abigail MiyamotoBlackman, Douglas, MD;  [facility];  Service: General;  Laterality: N/A;  ? TUBAL LIGATION  02/1993  ? ? ? ?No Known Allergies ? ? ? ?Family History  ?Problem Relation Age of Onset  ? Hypertension Mother   ? Cancer Mother   ? Colon polyps Mother   ? Healthy Sister   ? Healthy Sister   ? Healthy Sister   ? Healthy Sister   ? Healthy Son   ? Healthy Son   ? Healthy Daughter   ? Colon cancer Neg Hx   ? Esophageal cancer Neg Hx   ? Rectal cancer Neg Hx   ? Stomach cancer Neg Hx   ? ? ? ?Social History ?Hailey Ross reports that she quit smoking about 6 years ago. Her smoking use  included cigarettes. She has a 20.00 pack-year smoking history. She has been exposed to tobacco smoke. She has never used smokeless tobacco. ?Hailey Ross reports current alcohol use. ? ? ?Review of Systems ?CONSTITUTIONAL: No weight loss, fever, chills, weakness or fatigue.  ?HEENT: Eyes: No visual loss, blurred vision, double vision or yellow sclerae.No hearing loss, sneezing, congestion,  runny nose or sore throat.  ?SKIN: No rash or itching.  ?CARDIOVASCULAR: per hpi ?RESPIRATORY: No shortness of breath, cough or sputum.  ?GASTROINTESTINAL: No anorexia, nausea, vomiting or diarrhea. No abdominal pain or blood.  ?GENITOURINARY: No burning on urination, no polyuria ?NEUROLOGICAL: No headache, dizziness, syncope, paralysis, ataxia, numbness or tingling in the extremities. No change in bowel or bladder control.  ?MUSCULOSKELETAL: No muscle, back pain, joint pain or stiffness.  ?LYMPHATICS: No enlarged nodes. No history of splenectomy.  ?PSYCHIATRIC: No history of depression or anxiety.  ?ENDOCRINOLOGIC: No reports of sweating, cold or heat intolerance. No polyuria or polydipsia.  ?. ? ? ?Physical Examination ?There were no vitals filed for this visit. Ceasar Mons Weights  ? 07/06/21 1337  ?Weight: 218 lb 9.6 oz (99.2 kg)  ? ? ?Gen: resting comfortably, no acute distress ?HEENT: no scleral icterus, pupils equal round and reactive, no palptable cervical adenopathy,  ?CV: RRR, no m/r/g, no jvd ?Resp: Clear to auscultation bilaterally ?GI: abdomen is soft, non-tender, non-distended, normal bowel sounds, no hepatosplenomegaly ?MSK: extremities are warm, no edema.  ?Skin: warm, no rash ?Neuro:  no focal deficits ?Psych: appropriate affect ? ? ?Diagnostic Studies ?08/2019 event monitor ?30 day event monitor ?Min HR 47, Max HR 135, Avg HR 70. Min HR occurred in early AM hours presumably while sleeping ?Symptoms correlate with sinus rhythm ?No significant arrhythmias ?  ?09/2019 echo ?IMPRESSIONS  ? ? ? 1. Left ventricular ejection  fraction, by estimation, is 55 to 60%. The  ?left ventricle has normal function. The left ventricle has no regional  ?wall motion abnormalities. Left ventricular diastolic parameters were  ?normal.  ? 2

## 2021-07-06 NOTE — Patient Instructions (Signed)
Medication Instructions:  ?Your physician has recommended you make the following change in your medication:  ?Change diltiazem to 30 mg twice daily. May take an additional 30 mg tablet daily as needed for palpitations ?Continue other medications the same ? ?Labwork: ?none ? ?Testing/Procedures: ?none ? ?Follow-Up: ?Your physician recommends that you schedule a follow-up appointment in: 2-3 months ? ?Any Other Special Instructions Will Be Listed Below (If Applicable). ? ?If you need a refill on your cardiac medications before your next appointment, please call your pharmacy. ?

## 2021-07-12 ENCOUNTER — Ambulatory Visit: Payer: 59 | Admitting: Adult Health

## 2021-07-14 ENCOUNTER — Encounter: Payer: Self-pay | Admitting: Nurse Practitioner

## 2021-07-20 ENCOUNTER — Encounter: Payer: Self-pay | Admitting: Nurse Practitioner

## 2021-07-20 ENCOUNTER — Ambulatory Visit (INDEPENDENT_AMBULATORY_CARE_PROVIDER_SITE_OTHER): Payer: 59 | Admitting: Nurse Practitioner

## 2021-07-20 VITALS — BP 118/81 | HR 77 | Ht 65.0 in | Wt 218.0 lb

## 2021-07-20 DIAGNOSIS — R42 Dizziness and giddiness: Secondary | ICD-10-CM | POA: Diagnosis not present

## 2021-07-20 DIAGNOSIS — N182 Chronic kidney disease, stage 2 (mild): Secondary | ICD-10-CM

## 2021-07-20 DIAGNOSIS — I10 Essential (primary) hypertension: Secondary | ICD-10-CM

## 2021-07-20 DIAGNOSIS — R002 Palpitations: Secondary | ICD-10-CM | POA: Diagnosis not present

## 2021-07-20 NOTE — Progress Notes (Signed)
? ?  Hailey Ross     MRN: [id number]      DOB: 25-Mar-1971 ? ? ?HPI ?Ms. Johansson with medical history of bilateral PE, essential hypertension, CKD stage II, hyperlipidemia, palpitation is here for complaints of feeling lightheaded, palpitations  on and off since 2021, last week she felt her symptoms of lightheadedness got worsened. Denies CP, SOB, sensation of room spinning or roof spinning. she has seen neurology and cardiology, no one has been able to find the reason for her symptoms  She has not had her menstrual cycvle since a year ago.  Patient denies blood in her stool or urine .  Taking Cardizem 30 mg twice daily, lisinopril 5 mg daily.  Patient reports that her systolic blood pressure was 109 today.  ? ?ROS ?Denies recent fever or chills. ?Denies sinus pressure, nasal congestion, ear pain or sore throat. ?Denies chest congestion, productive cough or wheezing. ?Denies chest pains, palpitations and leg swelling ?Denies abdominal pain, nausea, vomiting,diarrhea or constipation.   ?Denies headaches, seizures, numbness, or tingling. ?Denies depression, anxiety or insomnia. ? ? ? ?PE ? ?BP 118/81 (BP Location: Left Arm, Patient Position: Sitting, Cuff Size: Large)   Pulse 77   Ht 5\' 5"  (1.651 m)   Wt 218 lb (98.9 kg)   SpO2 96%   BMI 36.28 kg/m?  ? ?Patient alert and oriented and in no cardiopulmonary distress ? ?Chest: Clear to auscultation bilaterally. ? ?CVS: S1, S2 no murmurs, no S3.Regular rate. ? ?ABD: Soft non tender.  ? ?Ext: No edema ? ?MS: Adequate ROM spine, shoulders, hips and knees. ? ?Psych: Good eye contact, normal affect. Memory intact not anxious or depressed appearing. ? ?CNS: CN 2-12 intact, power,  normal throughout.no focal deficits noted. ? ? ?Assessment & Plan ?Stage 2 chronic kidney disease ?Lab Results  ?Component Value Date  ? NA 139 07/03/2021  ? K 3.7 07/03/2021  ? CO2 24 07/03/2021  ? GLUCOSE 119 (H) 07/03/2021  ? BUN 18 07/03/2021  ? CREATININE 1.41 (H) 07/03/2021  ? CALCIUM 9.2  07/03/2021  ? GFRNONAA 45 (L) 07/03/2021  ?Chronic condition, followed by nephrology on lisinopril 5 mg daily ?Lightheadedness could be due to low blood pressure ?Patient told to contact nephrology office for possible decrease daily dose of lisinopril. ?Avoid nephro toxic agents. ? ?Lightheadedness ?Chronic condition ?Last CBC normal, no sign of anemia. ?She has been evaluated by neurology and cardiology.  ?Blood pressure soft in the office today, patient reports systolic blood pressure of 109 today. ?On lisinopril 5 mg daily, Cardizem 30 mg twice daily ?Patient asked to follow-up with nephrology for possible decreased dose of lisinopril she verbalized understanding. ? ?Palpitations ?Recently went to the ER for complaints of palpitations ?Heart rate normal in the office today.  ?Currently on Cardizem 30 mg twice daily ?Recently saw cardiology, no changes made to her medication ?Continue Cardizem 30 mg twice daily, follow-up with cardiology as needed.   ?

## 2021-07-20 NOTE — Assessment & Plan Note (Signed)
Lab Results  ?Component Value Date  ? NA 139 07/03/2021  ? K 3.7 07/03/2021  ? CO2 24 07/03/2021  ? GLUCOSE 119 (H) 07/03/2021  ? BUN 18 07/03/2021  ? CREATININE 1.41 (H) 07/03/2021  ? CALCIUM 9.2 07/03/2021  ? GFRNONAA 45 (L) 07/03/2021  ?Chronic condition, followed by nephrology on lisinopril 5 mg daily ?Lightheadedness could be due to low blood pressure ?Patient told to contact nephrology office for possible decrease daily dose of lisinopril. ?Avoid nephro toxic agents. ?

## 2021-07-20 NOTE — Patient Instructions (Addendum)
? ?  Please check with nephrology about your blood pressure medication.  ? ? ?It is important that you exercise regularly at least 30 minutes 5 times a week.  ?Think about what you will eat, plan ahead. ?Choose " clean, green, fresh or frozen" over canned, processed or packaged foods which are more sugary, salty and fatty. ?70 to 75% of food eaten should be vegetables and fruit. ?Three meals at set times with snacks allowed between meals, but they must be fruit or vegetables. ?Aim to eat over a 12 hour period , example 7 am to 7 pm, and STOP after  your last meal of the day. ?Drink water,generally about 64 ounces per day, no other drink is as healthy. Fruit juice is best enjoyed in a healthy way, by EATING the fruit. ? ?Thanks for choosing [facility], we consider it a privelige to serve you. ? ?

## 2021-07-20 NOTE — Assessment & Plan Note (Addendum)
Recently went to the ER for complaints of palpitations ?Heart rate normal in the office today.  ?Currently on Cardizem 30 mg twice daily ?Recently saw cardiology, no changes made to her medication ?Continue Cardizem 30 mg twice daily, follow-up with cardiology as needed.  ?

## 2021-07-20 NOTE — Assessment & Plan Note (Signed)
Chronic condition ?Last CBC normal, no sign of anemia. ?She has been evaluated by neurology and cardiology.  ?Blood pressure soft in the office today, patient reports systolic blood pressure of 109 today. ?On lisinopril 5 mg daily, Cardizem 30 mg twice daily ?Patient asked to follow-up with nephrology for possible decreased dose of lisinopril she verbalized understanding. ?

## 2021-07-30 ENCOUNTER — Telehealth: Payer: Self-pay

## 2021-07-30 NOTE — Telephone Encounter (Signed)
Received disability forms, will hold front folder until next appt. 05.22.2023 ?

## 2021-08-04 ENCOUNTER — Ambulatory Visit (INDEPENDENT_AMBULATORY_CARE_PROVIDER_SITE_OTHER): Payer: 59 | Admitting: Pulmonary Disease

## 2021-08-04 ENCOUNTER — Encounter: Payer: Self-pay | Admitting: Pulmonary Disease

## 2021-08-04 VITALS — BP 138/88 | HR 64 | Temp 97.7°F | Ht 65.0 in | Wt 223.6 lb

## 2021-08-04 DIAGNOSIS — J342 Deviated nasal septum: Secondary | ICD-10-CM

## 2021-08-04 DIAGNOSIS — G4733 Obstructive sleep apnea (adult) (pediatric): Secondary | ICD-10-CM

## 2021-08-04 DIAGNOSIS — Z862 Personal history of diseases of the blood and blood-forming organs and certain disorders involving the immune mechanism: Secondary | ICD-10-CM | POA: Diagnosis not present

## 2021-08-04 DIAGNOSIS — R058 Other specified cough: Secondary | ICD-10-CM

## 2021-08-04 MED ORDER — FLUTICASONE PROPIONATE 50 MCG/ACT NA SUSP: 1.0000 | Freq: Every evening | NASAL | 2 refills | Status: DC

## 2021-08-04 MED ORDER — AZELASTINE HCL 0.1 % NA SOLN: 1.0000 | Freq: Every evening | NASAL | 12 refills | Status: DC

## 2021-08-04 NOTE — Progress Notes (Signed)
? ?[facility] ? ?Chief Complaint  ?Patient presents with  ? Follow-up  ?  CPAP working well   ? ? ?Constitutional:  ?BP 138/88 (BP Location: Left Arm)   Pulse 64   Temp 97.7 ?F (36.5 ?C) (Temporal)   Ht 5' 5"  (1.651 m)   Wt 223 lb 9.6 oz (101.4 kg)   SpO2 97% Comment: ra  BMI 37.21 kg/m?  ? ?Past Medical History:  ?CKD, Anemia, PE March 2021 ? ?Past Surgical History:  ?Her  has a past surgical history that includes Tubal ligation (02/1993) and Cholecystectomy (N/A, 08/25/2016). ? ?Brief Summary:  ?Hailey Ross is a 51 y.o. female former smoker with pulmonary sarcoidosis, obstructive sleep apnea, and allergic rhinitis. ?  ? ? ? ?Subjective:  ? ?I last saw her in 2021.  More recently seen by Tammy Parrett. ? ?Treated for pneumonia in February 2023.   ? ?Symptoms more recently are upper airway.  She has sinus congestion and drainage, especially when she leans forward.  Gets cough with clear sputum.  Not having fever, wheeze, chest pain, sweats, or hemoptysis.  Not using any sprays for her nose or lungs. ? ?She tries using CPAP.  Pressure is fine.  Mask is uncomfortable.  Has nasal pillows mask. ? ?Physical Exam:  ? ?Appearance - well kempt  ? ?ENMT - no sinus tenderness, no oral exudate, no LAN, Mallampati 3 airway, no stridor, wears dentures, deviated septum ? ?Respiratory - equal breath sounds bilaterally, no wheezing or rales ? ?CV - s1s2 regular rate and rhythm, no murmurs ? ?Ext - no clubbing, no edema ? ?Skin - no rashes ? ?Psych - normal mood and affect  ?Pulmonary testing:  ?Labs 04/21/15 >> HIV negative, Quantiferon gold negative, ACE 195, ANA, RF negative, ESR 32 ?PFT 05/26/15 >> FEV1 1.67 (66%), FEV1% 88, TLC 2/78 (53%), DLCO 36% ?PFT 09/26/19 >> FEV1 1.33 (54%), FEV1% 91, TLC 3.17 (61%), DLCO 45% ? ?Chest Imaging:  ?CT chest 03/18/15 >> biapical thickening with traction BTX, BTX RML and lingula, subcarinal LAN 1.7 cm, patchy increased interstitial  markings ?HRCT chest 03/07/18 >> borderline LAN, patchy GGO, septal thickening, architectural distortion, cylindrical and varicose BTX ?CT angio chest 06/18/19 >> peripheral and subsegmental PEs, bulky LAN ?CT angio chest 07/15/19 >> apical fibrosis, no change in LAN ?CT angio chest 12/23/20 >> apical fibrosis with honeycombing and traction BTX ? ?Sleep Tests:  ?HST 07/23/19 >> AHI 9.5, SpO2 low 74% ?Auto CPAP 07/05/21 to 08/03/21 >> used on 13 of 30 nights with average 3 hrs 45 minutes.  Average AHI 0.1 with median CPAP 8 and 95 th percentile CPAP 12 cm H2O ? ?Cardiac Tests:  ?Echo 10/19/20 >> EF 55 to 60%, RVSP 35.5 mmHg ? ?Social History:  ?She  reports that she quit smoking about 6 years ago. Her smoking use included cigarettes. She has a 20.00 pack-year smoking history. She has been exposed to tobacco smoke. She has never used smokeless tobacco. She reports that she does not currently use alcohol. She reports that she does not use drugs. ? ?Family History:  ?Her family history includes Cancer in her mother; Colon polyps in her mother; Healthy in her daughter, sister, sister, sister, sister, son, and son; Hypertension in her mother. ?  ? ? ?Assessment/Plan:  ? ?Obstructive sleep apnea. ?- she is compliant with therapy and reports benefit from CPAP ?- uses [employer] for her DME ?- continue auto CPAP 5 - 15 cm H2O ?- will  try having her mask refit ? ?Upper airway cough with post nasal drip with deviated nasal septum. ?- will have her try nasal irrigation, flonase, and azelastine ? ?Fibrotic pulmonary sarcoidosis. ?- imaging has been stable ?- don't think this is causing any issues at present that warrant additional assessment or therapy ? 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 ?- remains on life long xarelto ?- followed by Dr. Delton Coombes with Hematology/Oncology ? ?Positive ANA. ?- followed by Dr. Bo Merino with rheumatology ? ?CKD 2. ?- followed by Dr. Theador Hawthorne with  nephrology ? ?Time Spent Involved in Patient Care on Day of Examination:  ?35 minutes ? ?Follow up:  ? ?Patient Instructions  ?Use the following regimen nightly: saline nasal rinse, then fluticasone (flonase) 1 spray in each nostril, then azelastine (astepro) 1 spray in each nostril ? ?Will have [employer] refit your CPAP mask ? ?Follow up in 6 months ? ?Medication List:  ? ?Allergies as of 08/04/2021   ?No Known Allergies ?  ? ?  ?Medication List  ?  ? ?  ? Accurate as of August 04, 2021 10:42 AM. If you have any questions, ask your nurse or doctor.  ?  ?  ? ?  ? ?acetaminophen 500 MG tablet ?Commonly known as: TYLENOL ?Take 500 mg by mouth every 4 (four) hours as needed for mild pain, moderate pain or headache. ?  ?albuterol (2.5 MG/3ML) 0.083% nebulizer solution ?Commonly known as: PROVENTIL ?Take 3 mLs (2.5 mg total) by nebulization every 6 (six) hours as needed for wheezing or shortness of breath. ?What changed: Another medication with the same name was changed. Make sure you understand how and when to take each. ?  ?albuterol 108 (90 Base) MCG/ACT inhaler ?Commonly known as: VENTOLIN HFA ?INHALE 2 PUFFS INTO THE LUNGS EVERY 6 HOURS AS NEEDED FOR WHEEZING OR SHORTNESS OF BREATH ?What changed: how much to take ?  ?azelastine 0.1 % nasal spray ?Commonly known as: ASTELIN ?Place 1 spray into both nostrils at bedtime. Use in each nostril as directed ?Started by: Chesley Mires, MD ?  ?diltiazem 30 MG tablet ?Commonly known as: CARDIZEM ?Take 1 tablet (30 mg total) by mouth 2 (two) times daily. May take an additional 30 mg tablet daily as needed for palpitations ?  ?fluticasone 50 MCG/ACT nasal spray ?Commonly known as: FLONASE ?Place 1 spray into both nostrils at bedtime. ?Started by: Chesley Mires, MD ?  ?lisinopril 5 MG tablet ?Commonly known as: ZESTRIL ?Take 5 mg by mouth daily. ?  ?LORazepam 0.5 MG tablet ?Commonly known as: ATIVAN ?Take 0.5 mg by mouth daily as needed. ?  ?pantoprazole 40 MG tablet ?Commonly known as:  PROTONIX ?Take 1 tablet (40 mg total) by mouth daily. ?  ?rivaroxaban 20 MG Tabs tablet ?Commonly known as: Xarelto ?Take 1 tablet (20 mg total) by mouth daily with supper. ?  ?rosuvastatin 5 MG tablet ?Commonly known as: CRESTOR ?Take 1 tablet (5 mg total) by mouth daily. ?  ?traZODone 50 MG tablet ?Commonly known as: DESYREL ?Take 50-100 mg by mouth at bedtime. ?  ?Vitamin D (Ergocalciferol) 1.25 MG (50000 UNIT) Caps capsule ?Commonly known as: DRISDOL ?Take 1 capsule (50,000 Units total) by mouth every 7 (seven) days. ?  ?zolpidem 10 MG tablet ?Commonly known as: AMBIEN ?Take 10 mg by mouth at bedtime as needed. ?  ? ?  ? ? ?Signature:  ?Chesley Mires, MD ?[place] ?Pager - [phone] ?08/04/2021, 10:42 AM ?  ? ? ? ? ? ? ? ? ?

## 2021-08-04 NOTE — Patient Instructions (Signed)
Use the following regimen nightly: saline nasal rinse, then fluticasone (flonase) 1 spray in each nostril, then azelastine (astepro) 1 spray in each nostril ? ?Will have [employer] refit your CPAP mask ? ?Follow up in 6 months ?

## 2021-08-05 NOTE — Progress Notes (Deleted)
NEUROLOGY FOLLOW UP OFFICE NOTE  Hailey Ross [id number]  Assessment/Plan:   Episodic lightheadedness/dizziness - not positional.  Low suspicion for migraine, seizure, or vertebrobasilar insufficiency but will evaluate to complete neurologic workup. Left arm numbness - consider carpal tunnel syndrome Numbness and tingling in toes - may be underlying small fiber neuropathy - she has sarcoidosis.  Autoimmune workup negative for Sjogren's.  No history of diabetes/pre-diabetes.  Prior history of B12 deficiency.     1  Check CTA of head to evaluate for vertebrobasilar insufficiency 2  EEG to evaluate for epileptiform discharges 3  NCV-EMG left upper extremity to evaluate for carpal tunnel syndrome 4 For further workup of neuropathy, check B12, folate, SPEP/IFE and TSH 5  Follow up after testing.     Subjective:  Hailey Ross is a 51 year old left-handed female with sarcoidosis, CKD and history of PE who follows up for lightheadedness and numbness.  UPDATE Underwent workup for episodic dizziness/lightheadedness: EEG on 02/15/2021 was normal.   CTA of head was ordered but not performed.  ***  Underwent workup for neuropathy/left arm numbness: Labs on 01/22/2021 showed normal TSH 1.17, SPEP/IFE negative for monoclonal gammopathy, but revealed low B12 and folate of 172 and 5.7 respectively.  She was advised to start OTC B12 daily.  ***  NCV-EMG on 02/23/2021 confirmed mild left carpal tunnel syndrome.  She was advised to wear a wrist splint.  ***     HISTORY: She began having dizzy spells in November 2020.  It is not positional and would come on suddenly.  It is a sensation of feeling "loopy" and would last for a couple of seconds.  Sometimes she may see black spots but no tunnel vision or double vision.  Sometimes she may have palpitations.  No headache, nausea, diaphoresis, focal numbness or weakness.  Frequency varies. It may occur frequently for several days and she  may have days to weeks without episodes.  In January 2021, she had a coughing spell that caused her to momentarily loss consciousness for a couple of seconds.  She did not fall but was holding a mug and the next thing she saw it on the floor.  There was some associated palpitations and tachycardia as well.  30 day cardiac event monitor in May 2021 showed no significant arrhythmias.  Echocardiogram in June 2021 showed EF 55-60% with no wall motion abnormalities, mildly elevated PASP of 35.3 mmHg, mildly dilated LA and RA and mild to moderate tricuspid regurgitation.  She saw neurology in November 2021.  MRI of brain with and without contrast on 02/25/2020 personally reviewed showed a partially empty sella but overall unremarkable study.  Carotid ultrasound on 03/02/2020 showed no hemodynamically significant stenosis.  Etiology believed to be multifactorial, related to dehydration, low blood pressure, menstrual cycle and pulmonary sarcoidosis.  She also had been experiencing atypical chest pain and palpitations.  She has followed up with cardiology.  Repeat echo overall unchanged.  Repeat cardiac event monitor showed 11 runs of SVT, longest lasting 9 beats with maximum rate of 148 but no significant sustained arrhythmias.  Lisinopril was stopped due to low blood pressure, but no change in the dizziness.   She also reports numbness in the left hand and arm.  Mostly notices it laying in bed.  No pain or weakness.  For a few months, she also notes numbness and tingling in her toes.  She reports treated for B12 deficiency last year but not currently.  She also reports  tinnitus.  PAST MEDICAL HISTORY: Past Medical History:  Diagnosis Date   Acute cholecystitis 08/24/2016   Acute kidney injury superimposed on chronic kidney disease (HCC) 06/20/2019   Acute respiratory failure with hypoxia (HCC) 06/19/2019   Annual visit for general adult medical examination with abnormal findings 04/16/2019   Arthritis    right knee    Bronchiectasis (HCC)    CKD (chronic kidney disease)    Iron deficiency    Lightheadedness 05/10/2019   PE (pulmonary thromboembolism) (HCC) 06/2019   Pre-syncope 04/16/2019   Sarcoidosis    Sarcoidosis    Sleep apnea    c pap   Smoker 02/12/2014    MEDICATIONS: Current Outpatient Medications on File Prior to Visit  Medication Sig Dispense Refill   acetaminophen (TYLENOL) 500 MG tablet Take 500 mg by mouth every 4 (four) hours as needed for mild pain, moderate pain or headache.      albuterol (PROVENTIL) (2.5 MG/3ML) 0.083% nebulizer solution Take 3 mLs (2.5 mg total) by nebulization every 6 (six) hours as needed for wheezing or shortness of breath. 75 mL 1   albuterol (VENTOLIN HFA) 108 (90 Base) MCG/ACT inhaler INHALE 2 PUFFS INTO THE LUNGS EVERY 6 HOURS AS NEEDED FOR WHEEZING OR SHORTNESS OF BREATH (Patient taking differently: Inhale 1-2 puffs into the lungs every 6 (six) hours as needed for wheezing or shortness of breath.) 6.7 g 5   azelastine (ASTELIN) 0.1 % nasal spray Place 1 spray into both nostrils at bedtime. Use in each nostril as directed 30 mL 12   diltiazem (CARDIZEM) 30 MG tablet Take 1 tablet (30 mg total) by mouth 2 (two) times daily. May take an additional 30 mg tablet daily as needed for palpitations 270 tablet 3   fluticasone (FLONASE) 50 MCG/ACT nasal spray Place 1 spray into both nostrils at bedtime. 16 g 2   lisinopril (ZESTRIL) 5 MG tablet Take 5 mg by mouth daily.     LORazepam (ATIVAN) 0.5 MG tablet Take 0.5 mg by mouth daily as needed.     pantoprazole (PROTONIX) 40 MG tablet Take 1 tablet (40 mg total) by mouth daily. 90 tablet 3   rivaroxaban (XARELTO) 20 MG TABS tablet Take 1 tablet (20 mg total) by mouth daily with supper. 90 tablet 3   rosuvastatin (CRESTOR) 5 MG tablet Take 1 tablet (5 mg total) by mouth daily. 90 tablet 1   traZODone (DESYREL) 50 MG tablet Take 50-100 mg by mouth at bedtime.     Vitamin D, Ergocalciferol, (DRISDOL) 1.25 MG (50000 UNIT)  CAPS capsule Take 1 capsule (50,000 Units total) by mouth every 7 (seven) days. 12 capsule 1   zolpidem (AMBIEN) 10 MG tablet Take 10 mg by mouth at bedtime as needed.     No current facility-administered medications on file prior to visit.    ALLERGIES: No Known Allergies  FAMILY HISTORY: Family History  Problem Relation Age of Onset   Hypertension Mother    Cancer Mother    Colon polyps Mother    Healthy Sister    Healthy Sister    Healthy Sister    Healthy Sister    Healthy Son    Healthy Son    Healthy Daughter    Colon cancer Neg Hx    Esophageal cancer Neg Hx    Rectal cancer Neg Hx    Stomach cancer Neg Hx       Objective:  *** General: No acute distress.  Patient appears ***-groomed.   Head:  Normocephalic/atraumatic Eyes:  Fundi examined but not visualized Neck: supple, no paraspinal tenderness, full range of motion Heart:  Regular rate and rhythm Lungs:  Clear to auscultation bilaterally Back: No paraspinal tenderness Neurological Exam: alert and oriented to person, place, and time.  Speech fluent and not dysarthric, language intact.  CN II-XII intact. Bulk and tone normal, muscle strength 5/5 throughout.  Sensation to light touch intact.  Deep tendon reflexes 2+ throughout, toes downgoing.  Finger to nose testing intact.  Gait normal, Romberg negative.   Shon Millet, DO  CC: ***

## 2021-08-09 ENCOUNTER — Ambulatory Visit: Payer: 59 | Admitting: Neurology

## 2021-08-11 ENCOUNTER — Ambulatory Visit
Admission: RE | Admit: 2021-08-11 | Discharge: 2021-08-11 | Disposition: A | Discharge status: Home / Self Care | Payer: 59 | Source: Ambulatory Visit | Attending: Nurse Practitioner | Admitting: Nurse Practitioner

## 2021-08-11 ENCOUNTER — Ambulatory Visit (INDEPENDENT_AMBULATORY_CARE_PROVIDER_SITE_OTHER): Payer: 59

## 2021-08-11 VITALS — BP 128/86 | HR 95 | Temp 100.1°F | Resp 18

## 2021-08-11 DIAGNOSIS — R0602 Shortness of breath: Secondary | ICD-10-CM | POA: Diagnosis not present

## 2021-08-11 DIAGNOSIS — J189 Pneumonia, unspecified organism: Secondary | ICD-10-CM

## 2021-08-11 DIAGNOSIS — D869 Sarcoidosis, unspecified: Secondary | ICD-10-CM | POA: Diagnosis not present

## 2021-08-11 DIAGNOSIS — R059 Cough, unspecified: Secondary | ICD-10-CM

## 2021-08-11 DIAGNOSIS — J069 Acute upper respiratory infection, unspecified: Secondary | ICD-10-CM

## 2021-08-11 MED ORDER — AZITHROMYCIN 250 MG PO TABS: 250.0000 mg | ORAL_TABLET | ORAL | 0 refills | Status: DC

## 2021-08-11 MED ORDER — ALBUTEROL SULFATE HFA 108 (90 BASE) MCG/ACT IN AERS: 2.0000 | INHALATION_SPRAY | Freq: Four times a day (QID) | RESPIRATORY_TRACT | 0 refills | Status: AC | PRN

## 2021-08-11 MED ORDER — PSEUDOEPH-BROMPHEN-DM 30-2-10 MG/5ML PO SYRP: 5.0000 mL | ORAL_SOLUTION | Freq: Four times a day (QID) | ORAL | 0 refills | Status: DC | PRN

## 2021-08-11 MED ORDER — FLUCONAZOLE 150 MG PO TABS: 150.0000 mg | ORAL_TABLET | Freq: Every day | ORAL | 0 refills | Status: DC

## 2021-08-11 MED ORDER — AMOXICILLIN-POT CLAVULANATE 875-125 MG PO TABS: 1.0000 | ORAL_TABLET | Freq: Two times a day (BID) | ORAL | 0 refills | Status: AC

## 2021-08-11 NOTE — Telephone Encounter (Signed)
Hailey Mires, MD  [facility] Triage Pool 7 minutes ago (3:42 PM)  ? ?Please schedule her for an ROV with me or one of the NPs in 2 weeks with a chest xray.   ? ? ?Called and spoke with pt about message and have scheduled her for an OV with BW 5/18. Stated to pt to arrive early for cxr prior. Nothing further needed. ?

## 2021-08-11 NOTE — Progress Notes (Deleted)
NEUROLOGY FOLLOW UP OFFICE NOTE  Hailey Ross [id number]  Assessment/Plan:   Episodic lightheadedness/dizziness - not positional.  Low suspicion for migraine, seizure, or vertebrobasilar insufficiency but will evaluate to complete neurologic workup. Left arm numbness - consider carpal tunnel syndrome Numbness and tingling in toes - may be underlying small fiber neuropathy - she has sarcoidosis.  Autoimmune workup negative for Sjogren's.  No history of diabetes/pre-diabetes.  Prior history of B12 deficiency.     1  Check CTA of head to evaluate for vertebrobasilar insufficiency 2  EEG to evaluate for epileptiform discharges 3  NCV-EMG left upper extremity to evaluate for carpal tunnel syndrome 4 For further workup of neuropathy, check B12, folate, SPEP/IFE and TSH 5  Follow up after testing.     Subjective:  Hailey Ross is a 51 year old left-handed female with sarcoidosis, CKD and history of PE who follows up for dizziness and left arm numbness.  UPDATE: To further evaluate episodic dizziness, she had an awake and asleep routine EEG on 02/15/2021 which was normal.  CTA head was ordered but never performed ***.  She underwent neuropathy workup.  Labs from October revealed low B12 and folate of 172 and 5.7 respectively.  Advised to start OTC B12 and folic acid 400mg  daily.  TSH and SPEP/IFE were unremarkable.  NCV-EMG of left upper extremity on 02/23/2021 showed evidence of mild carpal tunnel syndrome.  She was advised to wear a wrist splint.  ***  She continued to feel lightheaded.  No evidence of anemia.  She was experiencing palpitations which have been addressed by cardiology.  ***     HISTORY: She began having dizzy spells in November 2020.  It is not positional and would come on suddenly.  It is a sensation of feeling "loopy" and would last for a couple of seconds.  Sometimes she may see black spots but no tunnel vision or double vision.  Sometimes she may have  palpitations.  No headache, nausea, diaphoresis, focal numbness or weakness.  Frequency varies. It may occur frequently for several days and she may have days to weeks without episodes.  In January 2021, she had a coughing spell that caused her to momentarily loss consciousness for a couple of seconds.  She did not fall but was holding a mug and the next thing she saw it on the floor.  There was some associated palpitations and tachycardia as well.  30 day cardiac event monitor in May 2021 showed no significant arrhythmias.  Echocardiogram in June 2021 showed EF 55-60% with no wall motion abnormalities, mildly elevated PASP of 35.3 mmHg, mildly dilated LA and RA and mild to moderate tricuspid regurgitation.  She saw neurology in November 2021.  MRI of brain with and without contrast on 02/25/2020 personally reviewed showed a partially empty sella but overall unremarkable study.  Carotid ultrasound on 03/02/2020 showed no hemodynamically significant stenosis.  Etiology believed to be multifactorial, related to dehydration, low blood pressure, menstrual cycle and pulmonary sarcoidosis.  She also had been experiencing atypical chest pain and palpitations.  She has followed up with cardiology.  Repeat echo overall unchanged.  Repeat cardiac event monitor showed 11 runs of SVT, longest lasting 9 beats with maximum rate of 148 but no significant sustained arrhythmias.  Lisinopril was stopped due to low blood pressure, but no change in the dizziness.   She also reports numbness in the left hand and arm.  Mostly notices it laying in bed.  No pain or  weakness.  For a few months, she also notes numbness and tingling in her toes.  She reports treated for B12 deficiency last year but not currently.  She also reports tinnitus.  PAST MEDICAL HISTORY: Past Medical History:  Diagnosis Date   Acute cholecystitis 08/24/2016   Acute kidney injury superimposed on chronic kidney disease (HCC) 06/20/2019   Acute respiratory  failure with hypoxia (HCC) 06/19/2019   Annual visit for general adult medical examination with abnormal findings 04/16/2019   Arthritis    right knee   Bronchiectasis (HCC)    CKD (chronic kidney disease)    Iron deficiency    Lightheadedness 05/10/2019   PE (pulmonary thromboembolism) (HCC) 06/2019   Pre-syncope 04/16/2019   Sarcoidosis    Sarcoidosis    Sleep apnea    c pap   Smoker 02/12/2014    MEDICATIONS: Current Outpatient Medications on File Prior to Visit  Medication Sig Dispense Refill   acetaminophen (TYLENOL) 500 MG tablet Take 500 mg by mouth every 4 (four) hours as needed for mild pain, moderate pain or headache.      albuterol (PROVENTIL) (2.5 MG/3ML) 0.083% nebulizer solution Take 3 mLs (2.5 mg total) by nebulization every 6 (six) hours as needed for wheezing or shortness of breath. 75 mL 1   albuterol (VENTOLIN HFA) 108 (90 Base) MCG/ACT inhaler INHALE 2 PUFFS INTO THE LUNGS EVERY 6 HOURS AS NEEDED FOR WHEEZING OR SHORTNESS OF BREATH (Patient taking differently: Inhale 1-2 puffs into the lungs every 6 (six) hours as needed for wheezing or shortness of breath.) 6.7 g 5   azelastine (ASTELIN) 0.1 % nasal spray Place 1 spray into both nostrils at bedtime. Use in each nostril as directed 30 mL 12   diltiazem (CARDIZEM) 30 MG tablet Take 1 tablet (30 mg total) by mouth 2 (two) times daily. May take an additional 30 mg tablet daily as needed for palpitations 270 tablet 3   fluticasone (FLONASE) 50 MCG/ACT nasal spray Place 1 spray into both nostrils at bedtime. 16 g 2   lisinopril (ZESTRIL) 5 MG tablet Take 5 mg by mouth daily.     LORazepam (ATIVAN) 0.5 MG tablet Take 0.5 mg by mouth daily as needed.     pantoprazole (PROTONIX) 40 MG tablet Take 1 tablet (40 mg total) by mouth daily. 90 tablet 3   rivaroxaban (XARELTO) 20 MG TABS tablet Take 1 tablet (20 mg total) by mouth daily with supper. 90 tablet 3   rosuvastatin (CRESTOR) 5 MG tablet Take 1 tablet (5 mg total) by mouth  daily. 90 tablet 1   traZODone (DESYREL) 50 MG tablet Take 50-100 mg by mouth at bedtime.     Vitamin D, Ergocalciferol, (DRISDOL) 1.25 MG (50000 UNIT) CAPS capsule Take 1 capsule (50,000 Units total) by mouth every 7 (seven) days. 12 capsule 1   zolpidem (AMBIEN) 10 MG tablet Take 10 mg by mouth at bedtime as needed.     No current facility-administered medications on file prior to visit.    ALLERGIES: No Known Allergies  FAMILY HISTORY: Family History  Problem Relation Age of Onset   Hypertension Mother    Cancer Mother    Colon polyps Mother    Healthy Sister    Healthy Sister    Healthy Sister    Healthy Sister    Healthy Son    Healthy Son    Healthy Daughter    Colon cancer Neg Hx    Esophageal cancer Neg Hx    Rectal  cancer Neg Hx    Stomach cancer Neg Hx       Objective:  *** General: No acute distress.  Patient appears ***-groomed.   Head:  Normocephalic/atraumatic Eyes:  Fundi examined but not visualized Neck: supple, no paraspinal tenderness, full range of motion Heart:  Regular rate and rhythm Lungs:  Clear to auscultation bilaterally Back: No paraspinal tenderness Neurological Exam: alert and oriented to person, place, and time.  Speech fluent and not dysarthric, language intact.  CN II-XII intact. Bulk and tone normal, muscle strength 5/5 throughout.  Sensation to light touch intact.  Deep tendon reflexes 2+ throughout, toes downgoing.  Finger to nose testing intact.  Gait normal, Romberg negative.   Shon MilletAdam Avery Klingbeil, DO  CC: ***

## 2021-08-11 NOTE — ED Triage Notes (Signed)
Productive cough with green sputum, chills, chest congestion and headache since Sunday. ?

## 2021-08-11 NOTE — ED Provider Notes (Signed)
?[facility] ? ? ? ?CSN: [id number] ?Arrival date & time: 08/11/21  1202 ? ? ?  ? ?History   ?Chief Complaint ?Chief Complaint  ?Patient presents with  ? Cough  ?  Cough sore throat chills - Entered by patient  ? ? ?HPI ?Hailey Ross is a 51 y.o. female.  ? ?The patient is a 51 year old female who presents for cough, sore throat, and chills.  Patient states symptoms started approximately 3 days ago.  Patient reports she has a history of sarcoidosis and pneumonia.  She states that "I have been cold since yesterday".  She denies fever, ear pain, or GI symptoms.  Patient states she has been taking several over-the-counter medications without relief.  She states that she thinks she developed symptoms after her husband experienced the same or similar symptoms. ? ?The history is provided by the patient.  ?Cough ?Cough characteristics:  Productive ?Severity:  Moderate ?Timing:  Intermittent ?Progression:  Waxing and waning ?Context: sick contacts and with activity   ?Relieved by:  Cough suppressants ?Associated symptoms: chills, headaches, shortness of breath and sore throat   ?Associated symptoms: no chest pain, no ear pain and no wheezing   ? ?Past Medical History:  ?Diagnosis Date  ? Acute cholecystitis 08/24/2016  ? Acute kidney injury superimposed on chronic kidney disease ([place]) 06/20/2019  ? Acute respiratory failure with hypoxia ([place]) 06/19/2019  ? Annual visit for general adult medical examination with abnormal findings 04/16/2019  ? Arthritis   ? right knee  ? Bronchiectasis ([place])   ? CKD (chronic kidney disease)   ? Iron deficiency   ? Lightheadedness 05/10/2019  ? PE (pulmonary thromboembolism) ([place]) 06/2019  ? Pre-syncope 04/16/2019  ? Sarcoidosis   ? Sarcoidosis   ? Sleep apnea   ? c pap  ? Smoker 02/12/2014  ? ? ?Patient Active Problem List  ? Diagnosis Date Noted  ? CAP (community acquired pneumonia) 06/16/2021  ? Acute midline low back pain without sciatica 04/22/2021  ? Left lower quadrant abdominal  pain 04/20/2021  ? Atypical chest pain 01/26/2021  ? Synovitis of left knee 01/19/2021  ? GERD (gastroesophageal reflux disease) 12/29/2020  ? Abdominal pain, epigastric 12/28/2020  ? Primary osteoarthritis of left knee 10/27/2020  ? Grief at loss of child 06/09/2020  ? Insomnia 06/09/2020  ? Right ankle swelling 02/05/2020  ? Encounter for examination following treatment at hospital 01/28/2020  ? Palpitations 01/28/2020  ? Menorrhagia with regular cycle 10/15/2019  ? OSA (obstructive sleep apnea) 10/08/2019  ? Right knee pain 08/29/2019  ? Positive ANA (antinuclear antibody) 08/29/2019  ? Essential hypertension 07/04/2019  ? Pain and swelling of right knee 07/04/2019  ? Hyperlipidemia 07/04/2019  ? Stage 2 chronic kidney disease 07/04/2019  ? 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 Encounter for support and coordination of transition of care 07/04/2019  ? Daytime hypersomnolence 07/02/2019  ? Pulmonary hypertension ([place]) 07/02/2019  ? CKD (chronic kidney disease)   ? Bilateral pulmonary embolism ([place]) 06/18/2019  ? Lightheadedness 05/10/2019  ? Vitamin D deficiency 05/10/2019  ? Class 2 obesity due to excess calories with body mass index (BMI) of 37.0 to 37.9 in adult 04/16/2019  ? Sarcoidosis 05/04/2015  ? Bronchiectasis ([place]) 05/04/2015  ? ? ?Past Surgical History:  ?Procedure Laterality Date  ? CHOLECYSTECTOMY N/A 08/25/2016  ? Procedure: LAPAROSCOPIC CHOLECYSTECTOMY;  Surgeon: Coralie Keens, MD;  Location: [facility];  Service: General;  Laterality: N/A;  ? TUBAL LIGATION  02/1993  ? ? ?OB History   ? ?  Gravida  ?3  ? Para  ?3  ? Term  ?3  ? Preterm  ?   ? AB  ?   ? Living  ?3  ?  ? ? SAB  ?   ? IAB  ?   ? Ectopic  ?   ? Multiple  ?   ? Live Births  ?   ?   ?  ?  ? ? ? ?Home Medications   ? ?Prior to Admission medications   ?Medication Sig Start Date End Date Taking? Authorizing Provider  ?albuterol (VENTOLIN HFA) 108 (90 Base) MCG/ACT inhaler Inhale 2 puffs into the lungs every 6 (six) hours as needed for  wheezing or shortness of breath. 08/11/21  Yes Chaniya Genter-Warren, Alda Lea, NP  ?amoxicillin-clavulanate (AUGMENTIN) 875-125 MG tablet Take 1 tablet by mouth every 12 (twelve) hours for 7 days. 08/11/21 08/18/21 Yes Khy Pitre-Warren, Alda Lea, NP  ?azithromycin (ZITHROMAX Z-PAK) 250 MG tablet Take 1 tablet (250 mg total) by mouth as directed. Take 2 tablets today, then take 1 tablet for the next 4 days. 08/11/21  Yes Khalen Styer-Warren, Alda Lea, NP  ?brompheniramine-pseudoephedrine-DM 30-2-10 MG/5ML syrup Take 5 mLs by mouth 4 (four) times daily as needed. 08/11/21  Yes Annalee Meyerhoff-Warren, Alda Lea, NP  ?fluconazole (DIFLUCAN) 150 MG tablet Take 1 tablet (150 mg total) by mouth daily. Take one tablet if symptoms develop. If they do not improve within 72 hours, take the 2nd tablet. 08/11/21  Yes Marnae Madani-Warren, Alda Lea, NP  ?acetaminophen (TYLENOL) 500 MG tablet Take 500 mg by mouth every 4 (four) hours as needed for mild pain, moderate pain or headache.     [provider]  ?azelastine (ASTELIN) 0.1 % nasal spray Place 1 spray into both nostrils at bedtime. Use in each nostril as directed 08/04/21   Chesley Mires, MD  ?diltiazem (CARDIZEM) 30 MG tablet Take 1 tablet (30 mg total) by mouth 2 (two) times daily. May take an additional 30 mg tablet daily as needed for palpitations 07/06/21   Arnoldo Lenis, MD  ?fluticasone [facility]) 50 MCG/ACT nasal spray Place 1 spray into both nostrils at bedtime. 08/04/21   Chesley Mires, MD  ?lisinopril (ZESTRIL) 5 MG tablet Take 5 mg by mouth daily. 03/12/21   [provider]  ?LORazepam (ATIVAN) 0.5 MG tablet Take 0.5 mg by mouth daily as needed. 01/21/21   [provider]  ?pantoprazole (PROTONIX) 40 MG tablet Take 1 tablet (40 mg total) by mouth daily. 12/29/20   Parrett, Fonnie Mu, NP  ?rivaroxaban (XARELTO) 20 MG TABS tablet Take 1 tablet (20 mg total) by mouth daily with supper. 06/16/21   Parrett, Fonnie Mu, NP  ?rosuvastatin (CRESTOR) 5 MG tablet Take 1 tablet (5 mg total) by  mouth daily. 12/22/20   Noreene Larsson, NP  ?traZODone (DESYREL) 50 MG tablet Take 50-100 mg by mouth at bedtime. 11/05/20   [provider]  ?Vitamin D, Ergocalciferol, (DRISDOL) 1.25 MG (50000 UNIT) CAPS capsule Take 1 capsule (50,000 Units total) by mouth every 7 (seven) days. 12/22/20   Noreene Larsson, NP  ?zolpidem (AMBIEN) 10 MG tablet Take 10 mg by mouth at bedtime as needed. 03/18/21   [provider]  ? ? ?Family History ?Family History  ?Problem Relation Age of Onset  ? Hypertension Mother   ? Cancer Mother   ? Colon polyps Mother   ? Healthy Sister   ? Healthy Sister   ? Healthy Sister   ? Healthy Sister   ?  Healthy Son   ? Healthy Son   ? Healthy Daughter   ? Colon cancer Neg Hx   ? Esophageal cancer Neg Hx   ? Rectal cancer Neg Hx   ? Stomach cancer Neg Hx   ? ? ?Social History ?Social History  ? ?Tobacco Use  ? Smoking status: Former  ?  Packs/day: 1.00  ?  Years: 20.00  ?  Pack years: 20.00  ?  Types: Cigarettes  ?  Quit date: 03/21/2015  ?  Years since quitting: 6.3  ?  Passive exposure: Past  ? Smokeless tobacco: Never  ?Vaping Use  ? Vaping Use: Never used  ?Substance Use Topics  ? Alcohol use: Not Currently  ?  Comment: Rare  ? Drug use: Never  ? ? ? ?Allergies   ?Patient has no known allergies. ? ? ?Review of Systems ?Review of Systems  ?Constitutional:  Positive for activity change, chills and fatigue.  ?HENT:  Positive for congestion and sore throat. Negative for ear pain.   ?Eyes: Negative.   ?Respiratory:  Positive for cough and shortness of breath. Negative for wheezing.   ?Cardiovascular: Negative.  Negative for chest pain.  ?Gastrointestinal: Negative.   ?Skin: Negative.   ?Neurological:  Positive for headaches.  ?Psychiatric/Behavioral: Negative.    ? ? ?Physical Exam ?Triage Vital Signs ?ED Triage Vitals  ?Enc Vitals Group  ?   BP 08/11/21 1224 128/86  ?   Pulse Rate 08/11/21 1224 95  ?   Resp 08/11/21 1224 18  ?   Temp 08/11/21 1224 100.1 ?F (37.8 ?C)  ?   Temp Source  08/11/21 1224 Oral  ?   SpO2 08/11/21 1224 90 %  ?   Weight --   ?   Height --   ?   Head Circumference --   ?   Peak Flow --   ?   Pain Score 08/11/21 1228 8  ?   Pain Loc --   ?   Pain Edu? --   ?   Excl. in G

## 2021-08-11 NOTE — Discharge Instructions (Addendum)
Your chest x-ray is suggestive of pneumonia. ?Take medication as prescribed. ?May take ibuprofen or Tylenol for pain, fever, or general discomfort. ?Increase fluids and get plenty of rest. ?Warm salt water gargles 3-4 times daily to help with throat pain. ?May use albuterol inhaler as needed for shortness of breath or difficulty breathing. ?If symptoms do not improve, follow up within the next 5 to 7 days. ?

## 2021-08-11 NOTE — Telephone Encounter (Signed)
Dr. Craige Cotta, please see mychart message sent by pt as well as UC visit and advise. ?

## 2021-08-12 NOTE — Telephone Encounter (Signed)
Dr. Halford Chessman, please see new mychart message sent by pt about meds and advise. ?

## 2021-08-13 ENCOUNTER — Emergency Department (HOSPITAL_BASED_OUTPATIENT_CLINIC_OR_DEPARTMENT_OTHER): Payer: 59

## 2021-08-13 ENCOUNTER — Ambulatory Visit: Payer: 59 | Admitting: Neurology

## 2021-08-13 ENCOUNTER — Emergency Department (HOSPITAL_BASED_OUTPATIENT_CLINIC_OR_DEPARTMENT_OTHER)
Admission: EM | Admit: 2021-08-13 | Discharge: 2021-08-13 | Disposition: A | Discharge status: Home / Self Care | Payer: 59 | Attending: Emergency Medicine | Admitting: Emergency Medicine

## 2021-08-13 ENCOUNTER — Other Ambulatory Visit: Payer: Self-pay

## 2021-08-13 ENCOUNTER — Encounter (HOSPITAL_BASED_OUTPATIENT_CLINIC_OR_DEPARTMENT_OTHER): Payer: Self-pay

## 2021-08-13 DIAGNOSIS — Z79899 Other long term (current) drug therapy: Secondary | ICD-10-CM | POA: Diagnosis not present

## 2021-08-13 DIAGNOSIS — Z7901 Long term (current) use of anticoagulants: Secondary | ICD-10-CM | POA: Insufficient documentation

## 2021-08-13 DIAGNOSIS — R042 Hemoptysis: Secondary | ICD-10-CM | POA: Insufficient documentation

## 2021-08-13 DIAGNOSIS — J189 Pneumonia, unspecified organism: Secondary | ICD-10-CM

## 2021-08-13 DIAGNOSIS — D869 Sarcoidosis, unspecified: Secondary | ICD-10-CM | POA: Diagnosis not present

## 2021-08-13 DIAGNOSIS — R040 Epistaxis: Secondary | ICD-10-CM | POA: Diagnosis not present

## 2021-08-13 LAB — CBC
HCT: 39.1 % (ref 36.0–46.0)
Hemoglobin: 12.6 g/dL (ref 12.0–15.0)
MCH: 27.3 pg (ref 26.0–34.0)
MCHC: 32.2 g/dL (ref 30.0–36.0)
MCV: 84.8 fL (ref 80.0–100.0)
Platelets: 347 10*3/uL (ref 150–400)
RBC: 4.61 MIL/uL (ref 3.87–5.11)
RDW: 13.4 % (ref 11.5–15.5)
WBC: 17.7 10*3/uL — ABNORMAL HIGH (ref 4.0–10.5)
nRBC: 0 % (ref 0.0–0.2)

## 2021-08-13 LAB — BASIC METABOLIC PANEL
Anion gap: 11 (ref 5–15)
BUN: 12 mg/dL (ref 6–20)
CO2: 21 mmol/L — ABNORMAL LOW (ref 22–32)
Calcium: 9.4 mg/dL (ref 8.9–10.3)
Chloride: 101 mmol/L (ref 98–111)
Creatinine, Ser: 1.16 mg/dL — ABNORMAL HIGH (ref 0.44–1.00)
GFR, Estimated: 57 mL/min — ABNORMAL LOW (ref >60)
Glucose, Bld: 145 mg/dL — ABNORMAL HIGH (ref 70–99)
Potassium: 4.4 mmol/L (ref 3.5–5.1)
Sodium: 133 mmol/L — ABNORMAL LOW (ref 135–145)

## 2021-08-13 LAB — HCG, SERUM, QUALITATIVE: Preg, Serum: NEGATIVE

## 2021-08-13 LAB — TROPONIN I (HIGH SENSITIVITY)
Troponin I (High Sensitivity): 9 ng/L (ref <18)
Troponin I (High Sensitivity): 7 ng/L (ref <18)

## 2021-08-13 MED ORDER — IOHEXOL 350 MG/ML SOLN
75.0000 mL | Freq: Once | INTRAVENOUS | Status: AC | PRN
  Administered 2021-08-13: 56 mL via INTRAVENOUS

## 2021-08-13 NOTE — ED Triage Notes (Signed)
Patient here POV from Home.  ? ?Endorses Congestion, Sore Throat, Chills, Body Aches, Productive Cough for approximately 5 days. Seen at UC a few days PTA and diagnosed with PNA. Given Antibiotics but returns today due to Hemoptysis and Bloody Nasal Drainage.  ? ?No Known Fever. ? ?NAD Noted during Triage. A&Ox4. GCS 15. Ambulatory.  ?

## 2021-08-13 NOTE — ED Provider Notes (Signed)
?[facility] EMER[facility] DEPT ?Provider Note ? ? ?CSN: [id number] ?Arrival date & time: 08/13/21  1827 ? ?  ? ?History ? ?Chief Complaint  ?Patient presents with  ? Hemoptysis  ? ? ?Hailey Ross is a 51 y.o. female. ? ?HPI ? ? Pt started with a cough several days ago.  She went to an urgent care and was diagnosed with pneumonia.  She started to feel better but today she noticed a nosebleed and started coughing up blood.  She has history of pe so that concerned her.  She is on anticoagulants still.  No lightheaded, no fevers today. ? ?Home Medications ?Prior to Admission medications   ?Medication Sig Start Date End Date Taking? Authorizing Provider  ?acetaminophen (TYLENOL) 500 MG tablet Take 500 mg by mouth every 4 (four) hours as needed for mild pain, moderate pain or headache.     [provider]  ?albuterol (VENTOLIN HFA) 108 (90 Base) MCG/ACT inhaler Inhale 2 puffs into the lungs every 6 (six) hours as needed for wheezing or shortness of breath. 08/11/21   Leath-Warren, Sadie Haber, NP  ?amoxicillin-clavulanate (AUGMENTIN) 875-125 MG tablet Take 1 tablet by mouth every 12 (twelve) hours for 7 days. 08/11/21 08/18/21  Leath-Warren, Sadie Haber, NP  ?azelastine (ASTELIN) 0.1 % nasal spray Place 1 spray into both nostrils at bedtime. Use in each nostril as directed 08/04/21   Coralyn Helling, MD  ?azithromycin (ZITHROMAX Z-PAK) 250 MG tablet Take 1 tablet (250 mg total) by mouth as directed. Take 2 tablets today, then take 1 tablet for the next 4 days. 08/11/21   Leath-Warren, Sadie Haber, NP  ?brompheniramine-pseudoephedrine-DM 30-2-10 MG/5ML syrup Take 5 mLs by mouth 4 (four) times daily as needed. 08/11/21   Leath-Warren, Sadie Haber, NP  ?diltiazem (CARDIZEM) 30 MG tablet Take 1 tablet (30 mg total) by mouth 2 (two) times daily. May take an additional 30 mg tablet daily as needed for palpitations 07/06/21   Antoine Poche, MD  ?fluconazole (DIFLUCAN) 150 MG tablet Take 1 tablet (150 mg total) by mouth  daily. Take one tablet if symptoms develop. If they do not improve within 72 hours, take the 2nd tablet. 08/11/21   Leath-Warren, Sadie Haber, NP  ?fluticasone (FLONASE) 50 MCG/ACT nasal spray Place 1 spray into both nostrils at bedtime. 08/04/21   Coralyn Helling, MD  ?lisinopril (ZESTRIL) 5 MG tablet Take 5 mg by mouth daily. 03/12/21   [provider]  ?LORazepam (ATIVAN) 0.5 MG tablet Take 0.5 mg by mouth daily as needed. 01/21/21   [provider]  ?pantoprazole (PROTONIX) 40 MG tablet Take 1 tablet (40 mg total) by mouth daily. 12/29/20   Parrett, Virgel Bouquet, NP  ?rivaroxaban (XARELTO) 20 MG TABS tablet Take 1 tablet (20 mg total) by mouth daily with supper. 06/16/21   Parrett, Virgel Bouquet, NP  ?rosuvastatin (CRESTOR) 5 MG tablet Take 1 tablet (5 mg total) by mouth daily. 12/22/20   Heather Roberts, NP  ?traZODone (DESYREL) 50 MG tablet Take 50-100 mg by mouth at bedtime. 11/05/20   [provider]  ?Vitamin D, Ergocalciferol, (DRISDOL) 1.25 MG (50000 UNIT) CAPS capsule Take 1 capsule (50,000 Units total) by mouth every 7 (seven) days. 12/22/20   Heather Roberts, NP  ?zolpidem (AMBIEN) 10 MG tablet Take 10 mg by mouth at bedtime as needed. 03/18/21   [provider]  ?   ? ?Allergies    ?Patient has no known allergies.   ? ?Review of Systems   ?Review  of Systems  ?Respiratory:  Negative for shortness of breath.   ? ?Physical Exam ?Updated Vital Signs ?BP 122/80   Pulse 82   Temp 99.1 ?F (37.3 ?C)   Resp 20   SpO2 94%  ?Physical Exam ?Vitals and nursing note reviewed.  ?Constitutional:   ?   General: She is not in acute distress. ?   Appearance: She is well-developed.  ?HENT:  ?   Head: Normocephalic and atraumatic.  ?   Right Ear: External ear normal.  ?   Left Ear: External ear normal.  ?   Nose:  ?   Comments: Small amount of dried blood noted in the right nares, no active bleeding ?Eyes:  ?   General: No scleral icterus.    ?   Right eye: No discharge.     ?   Left eye: No discharge.  ?    Conjunctiva/sclera: Conjunctivae normal.  ?Neck:  ?   Trachea: No tracheal deviation.  ?Cardiovascular:  ?   Rate and Rhythm: Normal rate and regular rhythm.  ?Pulmonary:  ?   Effort: Pulmonary effort is normal. No respiratory distress.  ?   Breath sounds: Normal breath sounds. No stridor. No wheezing or rales.  ?Abdominal:  ?   General: Bowel sounds are normal. There is no distension.  ?   Palpations: Abdomen is soft.  ?   Tenderness: There is no abdominal tenderness. There is no guarding or rebound.  ?Musculoskeletal:     ?   General: No tenderness or deformity.  ?   Cervical back: Neck supple.  ?Skin: ?   General: Skin is warm and dry.  ?   Findings: No rash.  ?Neurological:  ?   General: No focal deficit present.  ?   Mental Status: She is alert.  ?   Cranial Nerves: No cranial nerve deficit (no facial droop, extraocular movements intact, no slurred speech).  ?   Sensory: No sensory deficit.  ?   Motor: No abnormal muscle tone or seizure activity.  ?   Coordination: Coordination normal.  ?Psychiatric:     ?   Mood and Affect: Mood normal.  ? ? ?ED Results / Procedures / Treatments   ?Labs ?(all labs ordered are listed, but only abnormal results are displayed) ?Labs Reviewed  ?BASIC METABOLIC PANEL - Abnormal; Notable for the following components:  ?    Result Value  ? Sodium 133 (*)   ? CO2 21 (*)   ? Glucose, Bld 145 (*)   ? Creatinine, Ser 1.16 (*)   ? GFR, Estimated 57 (*)   ? All other components within normal limits  ?CBC - Abnormal; Notable for the following components:  ? WBC 17.7 (*)   ? All other components within normal limits  ?HCG, SERUM, QUALITATIVE  ?TROPONIN I (HIGH SENSITIVITY)  ?TROPONIN I (HIGH SENSITIVITY)  ? ? ?EKG ?EKG Interpretation ? ?Date/Time:  Friday Aug 13 2021 18:47:01 EDT ?Ventricular Rate:  88 ?PR Interval:  138 ?QRS Duration: 86 ?QT Interval:  352 ?QTC Calculation: 425 ?R Axis:   59 ?Text Interpretation: Normal sinus rhythm Normal ECG When compared with ECG of 03-Jul-2021 02:35, T  wave amplitude has decreased in Lateral leads Confirmed by Linwood DibblesKnapp, Snigdha Howser [phone]) on 08/13/2021 9:19:43 PM ? ?Radiology ?CT Angio Chest PE W and/or Wo Contrast ? ?Result Date: 08/13/2021 ?CLINICAL DATA:  Concern for pulmonary embolism.  Sarcoid. EXAM: CT ANGIOGRAPHY CHEST WITH CONTRAST TECHNIQUE: Multidetector CT imaging of the chest was performed using the  standard protocol during bolus administration of intravenous contrast. Multiplanar CT image reconstructions and MIPs were obtained to evaluate the vascular anatomy. RADIATION DOSE REDUCTION: This exam was performed according to the departmental dose-optimization program which includes automated exposure control, adjustment of the mA and/or kV according to patient size and/or use of iterative reconstruction technique. CONTRAST:  39mL OMNIPAQUE IOHEXOL 350 MG/ML SOLN COMPARISON:  Chest CT dated 12/23/2020. FINDINGS: Cardiovascular: There is no cardiomegaly or pericardial effusion. Mild atherosclerotic calcification of the aortic arch. No aneurysmal dilatation or dissection. The origins of the great vessels of the aortic arch appear patent as visualized. No pulmonary artery embolus identified. Mediastinum/Nodes: Right hilar and mediastinal adenopathy relatively similar to prior CT in keeping with history of sarcoid. The esophagus and the thyroid gland are grossly unremarkable. No mediastinal fluid collection. Lungs/Pleura: Biapical predominant subpleural fibrosis with honeycombing and traction bronchiectasis. A cluster of ground-glass density in the right lower lobe (79/6) may represent progression of fibrosis although developing infiltrate is not excluded clinical correlation is recommended. There is no pleural effusion pneumothorax. The central airways are patent. Upper Abdomen: Cholecystectomy. Musculoskeletal: No chest wall abnormality. No acute or significant osseous findings. Review of the MIP images confirms the above findings. IMPRESSION: 1. No CT evidence of  pulmonary embolism. 2. Biapical predominant subpleural fibrosis with honeycombing and traction bronchiectasis. 3. A cluster of ground-glass density in the right lower lobe may represent progression of fibrosis al

## 2021-08-13 NOTE — Discharge Instructions (Signed)
Continue your antibiotics until complete.  Follow-up with your primary care doctor or pulmonary doctor to be rechecked if your symptoms do not resolve.  Return to the ER for evaluation of worsening symptoms ?

## 2021-08-26 ENCOUNTER — Encounter: Payer: Self-pay | Admitting: Primary Care

## 2021-08-26 ENCOUNTER — Ambulatory Visit (INDEPENDENT_AMBULATORY_CARE_PROVIDER_SITE_OTHER): Payer: 59

## 2021-08-26 ENCOUNTER — Ambulatory Visit (INDEPENDENT_AMBULATORY_CARE_PROVIDER_SITE_OTHER): Payer: 59 | Admitting: Primary Care

## 2021-08-26 DIAGNOSIS — G4733 Obstructive sleep apnea (adult) (pediatric): Secondary | ICD-10-CM

## 2021-08-26 DIAGNOSIS — J189 Pneumonia, unspecified organism: Secondary | ICD-10-CM | POA: Diagnosis not present

## 2021-08-26 NOTE — Progress Notes (Signed)
Reviewed and agree with assessment/plan.   Chesley Mires, MD [facility] 08/26/2021, 11:17 AM Pager:  [phone]

## 2021-08-26 NOTE — Patient Instructions (Addendum)
Repeat CXR today showed stable coarse interstitial densities consistent with sarcoidosis.  No acute abnormality  Call if you notice further hemoptysis/blood in sputum or nose bleeds  Continue to wear CPAP every night for 4-6 hours or longer   Follow-up: Recall should already be in for 6 months with Dr. Craige Cotta

## 2021-08-26 NOTE — Progress Notes (Signed)
  ID: Hailey Ross, female    DOB: [birth year], 51 y.o.   MRN: [id number]  No chief complaint on file.   Referring provider: Donell Beers, FNP  HPI: 51 year old female, former smoker.  Past medical history significant for pulmonary sarcoidosis, obstructive sleep apnea and allergic rhinitis, chronic kidney disease, anemia, PE March 2021.  Patient of Dr. Craige Cotta, last seen in office on 08/04/2021 for OSA.  Previous [facility] pulmonary encounters: 08/04/21- Dr. Craige Cotta  I last saw her in 2021.  More recently seen by Tammy Parrett.   Treated for pneumonia in February 2023.     Symptoms more recently are upper airway.  She has sinus congestion and drainage, especially when she leans forward.  Gets cough with clear sputum.  Not having fever, wheeze, chest pain, sweats, or hemoptysis.  Not using any sprays for her nose or lungs.   She tries using CPAP.  Pressure is fine.  Mask is uncomfortable.  Has nasal pillows mask.  08/26/2021- interim hx  Patient presents today for 2-3 week follow-up CAP. Hx fibrotic pulmonary sarcoidosis, unprovoked PE and OSA. She was seen at [facility] in [place] on 5/3 for URI symptoms/cough. CXR was concerning for pneumonia to right lung base, she was started on abx. She then began coughing up blood after having a nose bleed. She is on Xarelto for hx PE. She was seen at [facility] ED on 08/13/21 for hemoptysis. CTA showed no evidence of PE, biapical predominant subpleural fibrosis with honeycombing and traction bronchiectasis. Cluster of ground glass density RLL. She has since completed course of Azithromycin and Doxycycline and is feeling significantly better. Repeat CXR today showed stable coarse interstitial densities consistent with sarcoidosis. No acute abnormality.   She is no longer coughing up blood. No current mucus production. No shortness of breath, wheezing or chest tightness.  CPAP mask was recently changed to full face mask and she prefers this, doing  very well.   Sleep test: HST 07/23/19 >> AHI 9.5, SpO2 low 74%  Chest imaging: CT chest 03/18/15 >> biapical thickening with traction BTX, BTX RML and lingula, subcarinal LAN 1.7 cm, patchy increased interstitial markings HRCT chest 03/07/18 >> borderline LAN, patchy GGO, septal thickening, architectural distortion, cylindrical and varicose BTX CT angio chest 06/18/19 >> peripheral and subsegmental PEs, bulky LAN CT angio chest 07/15/19 >> apical fibrosis, no change in LAN CT angio chest 12/23/20 >> apical fibrosis with honeycombing and traction BTX  No Known Allergies  Immunization History  Administered Date(s) Administered   Influenza Inj Mdck Quad Pf 12/25/2019   Influenza,inj,Quad PF,6+ Mos 12/21/2018, 02/22/2021   Influenza,inj,Quad PF,6-35 Mos 01/10/2019   PFIZER(Purple Top)SARS-COV-2 Vaccination 08/14/2019, 09/10/2019, 02/07/2020   Pneumococcal Polysaccharide-23 02/12/2014   Tdap 02/12/2014    Past Medical History:  Diagnosis Date   Acute cholecystitis 08/24/2016   Acute kidney injury superimposed on chronic kidney disease (HCC) 06/20/2019   Acute respiratory failure with hypoxia (HCC) 06/19/2019   Annual visit for general adult medical examination with abnormal findings 04/16/2019   Arthritis    right knee   Bronchiectasis (HCC)    CKD (chronic kidney disease)    Iron deficiency    Lightheadedness 05/10/2019   PE (pulmonary thromboembolism) (HCC) 06/2019   Pre-syncope 04/16/2019   Sarcoidosis    Sarcoidosis    Sleep apnea    c pap   Smoker 02/12/2014    Tobacco History: Social History   Tobacco Use  Smoking Status Former   Packs/day: 1.00   Years: 20.00  Pack years: 20.00   Types: Cigarettes   Quit date: 03/21/2015   Years since quitting: 6.4   Passive exposure: Past  Smokeless Tobacco Never   Counseling given: Not Answered   Outpatient Medications Prior to Visit  Medication Sig Dispense Refill   acetaminophen (TYLENOL) 500 MG tablet Take 500 mg by mouth  every 4 (four) hours as needed for mild pain, moderate pain or headache.      albuterol (VENTOLIN HFA) 108 (90 Base) MCG/ACT inhaler Inhale 2 puffs into the lungs every 6 (six) hours as needed for wheezing or shortness of breath. 8 g 0   azelastine (ASTELIN) 0.1 % nasal spray Place 1 spray into both nostrils at bedtime. Use in each nostril as directed 30 mL 12   azithromycin (ZITHROMAX Z-PAK) 250 MG tablet Take 1 tablet (250 mg total) by mouth as directed. Take 2 tablets today, then take 1 tablet for the next 4 days. 6 tablet 0   brompheniramine-pseudoephedrine-DM 30-2-10 MG/5ML syrup Take 5 mLs by mouth 4 (four) times daily as needed. 140 mL 0   diltiazem (CARDIZEM) 30 MG tablet Take 1 tablet (30 mg total) by mouth 2 (two) times daily. May take an additional 30 mg tablet daily as needed for palpitations 270 tablet 3   fluconazole (DIFLUCAN) 150 MG tablet Take 1 tablet (150 mg total) by mouth daily. Take one tablet if symptoms develop. If they do not improve within 72 hours, take the 2nd tablet. 2 tablet 0   fluticasone (FLONASE) 50 MCG/ACT nasal spray Place 1 spray into both nostrils at bedtime. 16 g 2   lisinopril (ZESTRIL) 5 MG tablet Take 5 mg by mouth daily.     LORazepam (ATIVAN) 0.5 MG tablet Take 0.5 mg by mouth daily as needed.     pantoprazole (PROTONIX) 40 MG tablet Take 1 tablet (40 mg total) by mouth daily. 90 tablet 3   rivaroxaban (XARELTO) 20 MG TABS tablet Take 1 tablet (20 mg total) by mouth daily with supper. 90 tablet 3   rosuvastatin (CRESTOR) 5 MG tablet Take 1 tablet (5 mg total) by mouth daily. 90 tablet 1   traZODone (DESYREL) 50 MG tablet Take 50-100 mg by mouth at bedtime.     Vitamin D, Ergocalciferol, (DRISDOL) 1.25 MG (50000 UNIT) CAPS capsule Take 1 capsule (50,000 Units total) by mouth every 7 (seven) days. 12 capsule 1   zolpidem (AMBIEN) 10 MG tablet Take 10 mg by mouth at bedtime as needed.     No facility-administered medications prior to visit.   Review of  Systems  Review of Systems  Constitutional: Negative.   HENT: Negative.    Respiratory: Negative.  Negative for cough, chest tightness, shortness of breath and wheezing.   Cardiovascular: Negative.     Physical Exam  There were no vitals taken for this visit. Physical Exam Constitutional:      General: She is not in acute distress.    Appearance: Normal appearance. She is not ill-appearing.     Comments: Appears well  HENT:     Head: Normocephalic and atraumatic.     Mouth/Throat:     Mouth: Mucous membranes are moist.     Pharynx: Oropharynx is clear.  Cardiovascular:     Rate and Rhythm: Normal rate and regular rhythm.  Pulmonary:     Effort: Pulmonary effort is normal.     Breath sounds: Normal breath sounds. No wheezing, rhonchi or rales.  Musculoskeletal:        General:  Normal range of motion.  Skin:    General: Skin is warm and dry.  Neurological:     General: No focal deficit present.     Mental Status: She is alert and oriented to person, place, and time. Mental status is at baseline.  Psychiatric:        Mood and Affect: Mood normal.        Behavior: Behavior normal.        Thought Content: Thought content normal.        Judgment: Judgment normal.     Lab Results:  CBC    Component Value Date/Time   WBC 17.7 (H) 08/13/2021 1847   RBC 4.61 08/13/2021 1847   HGB 12.6 08/13/2021 1847   HGB 12.7 12/28/2020 1126   HCT 39.1 08/13/2021 1847   HCT 38.5 12/28/2020 1126   PLT 347 08/13/2021 1847   PLT 328 12/28/2020 1126   MCV 84.8 08/13/2021 1847   MCV 82 12/28/2020 1126   MCH 27.3 08/13/2021 1847   MCHC 32.2 08/13/2021 1847   RDW 13.4 08/13/2021 1847   RDW 13.1 12/28/2020 1126   LYMPHSABS 5.5 (H) 07/03/2021 0255   LYMPHSABS 4.0 (H) 12/28/2020 1126   MONOABS 0.6 07/03/2021 0255   EOSABS 0.1 07/03/2021 0255   EOSABS 0.1 12/28/2020 1126   BASOSABS 0.0 07/03/2021 0255   BASOSABS 0.0 12/28/2020 1126    BMET    Component Value Date/Time   NA 133 (L)  08/13/2021 1847   K 4.4 08/13/2021 1847   CL 101 08/13/2021 1847   CO2 21 (L) 08/13/2021 1847   GLUCOSE 145 (H) 08/13/2021 1847   BUN 12 08/13/2021 1847   CREATININE 1.16 (H) 08/13/2021 1847   CREATININE 1.19 (H) 09/23/2020 1458   CALCIUM 9.4 08/13/2021 1847   GFRNONAA 57 (L) 08/13/2021 1847   GFRNONAA 53 (L) 09/23/2020 1458   GFRAA 62 09/23/2020 1458    BNP No results found for: BNP  ProBNP No results found for: PROBNP  Imaging: DG Chest 2 View  Result Date: 08/11/2021 CLINICAL DATA:  Cough and shortness of several days.  Sarcoidosis. EXAM: CHEST - 2 VIEW COMPARISON:  07/03/2021 FINDINGS: Heart size is normal. Coarsening of interstitial lung markings again seen, consistent with chronic pulmonary involvement by sarcoidosis. Mild patchy opacity is seen in the right lung base, suspicious for pneumonia. No evidence of pleural effusion. IMPRESSION: Mild patchy opacity in right lung base, suspicious for pneumonia. Chronic pulmonary involvement by sarcoidosis. Electronically Signed   By: Danae Orleans M.D.   On: 08/11/2021 13:13   CT Angio Chest PE W and/or Wo Contrast  Result Date: 08/13/2021 CLINICAL DATA:  Concern for pulmonary embolism.  Sarcoid. EXAM: CT ANGIOGRAPHY CHEST WITH CONTRAST TECHNIQUE: Multidetector CT imaging of the chest was performed using the standard protocol during bolus administration of intravenous contrast. Multiplanar CT image reconstructions and MIPs were obtained to evaluate the vascular anatomy. RADIATION DOSE REDUCTION: This exam was performed according to the departmental dose-optimization program which includes automated exposure control, adjustment of the mA and/or kV according to patient size and/or use of iterative reconstruction technique. CONTRAST:  30mL OMNIPAQUE IOHEXOL 350 MG/ML SOLN COMPARISON:  Chest CT dated 12/23/2020. FINDINGS: Cardiovascular: There is no cardiomegaly or pericardial effusion. Mild atherosclerotic calcification of the aortic arch. No  aneurysmal dilatation or dissection. The origins of the great vessels of the aortic arch appear patent as visualized. No pulmonary artery embolus identified. Mediastinum/Nodes: Right hilar and mediastinal adenopathy relatively similar to prior CT in  keeping with history of sarcoid. The esophagus and the thyroid gland are grossly unremarkable. No mediastinal fluid collection. Lungs/Pleura: Biapical predominant subpleural fibrosis with honeycombing and traction bronchiectasis. A cluster of ground-glass density in the right lower lobe (79/6) may represent progression of fibrosis although developing infiltrate is not excluded clinical correlation is recommended. There is no pleural effusion pneumothorax. The central airways are patent. Upper Abdomen: Cholecystectomy. Musculoskeletal: No chest wall abnormality. No acute or significant osseous findings. Review of the MIP images confirms the above findings. IMPRESSION: 1. No CT evidence of pulmonary embolism. 2. Biapical predominant subpleural fibrosis with honeycombing and traction bronchiectasis. 3. A cluster of ground-glass density in the right lower lobe may represent progression of fibrosis although developing infiltrate is not excluded. 4. Right hilar and mediastinal adenopathy relatively similar to prior CT in keeping with history of sarcoid. 5. Aortic Atherosclerosis (ICD10-I70.0). Electronically Signed   By: Elgie CollardArash  Radparvar M.D.   On: 08/13/2021 21:02     Assessment & Plan:   No problem-specific Assessment & Plan notes found for this encounter.     Glenford BayleyElizabeth W Tiyana Galla, NP 08/26/2021

## 2021-08-26 NOTE — Assessment & Plan Note (Addendum)
-   Resolved; She was treated for suspected right lower lobe pneumonia at [facility] on 08/11/21. She developed hemoptysis and presented to [facility] ED on 5/5. She is on a blood thinner for hx unprovoked PE. CTA was negative for PE but showed cluster ground glass density RLL. She has completed azithromycin and doxycycline course and is feeling significantly better. She no longer has productive cough or hemoptysis. Repeat CXR today without acute abnormality. Advised patient to notify our office if symptoms return.

## 2021-08-26 NOTE — Assessment & Plan Note (Signed)
-   Changed from nasal mask to full face mask, tolerating CPAP much better - DME company is [employer] - FU in 6 months with Dr. Halford Chessman

## 2021-08-30 ENCOUNTER — Encounter: Payer: 59 | Admitting: Nurse Practitioner

## 2021-08-30 ENCOUNTER — Encounter: Payer: Self-pay | Admitting: Family Medicine

## 2021-08-30 ENCOUNTER — Ambulatory Visit (INDEPENDENT_AMBULATORY_CARE_PROVIDER_SITE_OTHER): Payer: 59 | Admitting: Family Medicine

## 2021-08-30 DIAGNOSIS — Z0001 Encounter for general adult medical examination with abnormal findings: Secondary | ICD-10-CM | POA: Insufficient documentation

## 2021-08-30 DIAGNOSIS — Z0000 Encounter for general adult medical examination without abnormal findings: Secondary | ICD-10-CM

## 2021-08-30 NOTE — Patient Instructions (Signed)
I appreciate the opportunity to provide care to you today!    Follow up:  3 months     Please continue to a heart-healthy diet and increase your physical activities. Try to exercise for 30mins at least three times a week.      It was a pleasure to see you and I look forward to continuing to work together on your health and well-being. Please do not hesitate to call the office if you need care or have questions about your care.   Have a wonderful day and week. With Gratitude, Scout Guyett MSN, FNP-BC  

## 2021-08-30 NOTE — Assessment & Plan Note (Addendum)
-   Physical exam as documented. Counseling done about healthy lifestyle involving commitment to 150 minutes exercise per week, heart healthy diet, and attaining healthy weight. -The importance of adequate sleep also discussed. -Changes in health habits are decided on by the patient with goals and time frames  set for achieving them. -Immunization and cancer screening needs are specifically addressed at this visit.

## 2021-08-30 NOTE — Progress Notes (Addendum)
Established Patient Office Visit  Subjective:  Patient ID: Hailey Ross, female    DOB: 1970/09/09  Age: 51 y.o. MRN: [id number]  CC:  Chief Complaint  Patient presents with   Annual Exam    Pt is here for complete physical, no health concerns.    HPI Hailey Ross is a 51 y.o. female with past medical history of essential hypertension and sarcoidosis presents for a physical exam. She reports that her daughter was murdered last year, 2022, and she dealt with depression during that time but is currently in a better place. She is raising her daughter's two childer and speaking with a therapist. She admits to minimum physical activities but reports limiting her sugar and caffeine intake. Her goal this year is to lose 20 lbs. She doesn't smoke or drink and visits the dentist regularly. She had pneumonia on 08/11/21 with residual nasal congestion and rhinorrhea.  Past Medical History:  Diagnosis Date   Acute cholecystitis 08/24/2016   Acute kidney injury superimposed on chronic kidney disease (HCC) 06/20/2019   Acute respiratory failure with hypoxia (HCC) 06/19/2019   Annual visit for general adult medical examination with abnormal findings 04/16/2019   Arthritis    right knee   Bronchiectasis (HCC)    CKD (chronic kidney disease)    Iron deficiency    Lightheadedness 05/10/2019   PE (pulmonary thromboembolism) (HCC) 06/2019   Pre-syncope 04/16/2019   Sarcoidosis    Sarcoidosis    Sleep apnea    c pap   Smoker 02/12/2014    Past Surgical History:  Procedure Laterality Date   CHOLECYSTECTOMY N/A 08/25/2016   Procedure: LAPAROSCOPIC CHOLECYSTECTOMY;  Surgeon: Abigail Miyamoto, MD;  Location: [facility] OR;  Service: General;  Laterality: N/A;   TUBAL LIGATION  02/1993    Family History  Problem Relation Age of Onset   Hypertension Mother    Cancer Mother    Colon polyps Mother    Healthy Sister    Healthy Sister    Healthy Sister    Healthy Sister    Healthy Son    Healthy Son     Healthy Daughter    Colon cancer Neg Hx    Esophageal cancer Neg Hx    Rectal cancer Neg Hx    Stomach cancer Neg Hx     Social History   Socioeconomic History   Marital status: Married    Spouse name: Not on file   Number of children: 3   Years of education: Not on file   Highest education level: High school graduate  Occupational History   Occupation: Administrator, [employer] ([employer])  Tobacco Use   Smoking status: Former    Packs/day: 1.00    Years: 20.00    Pack years: 20.00    Types: Cigarettes    Quit date: 03/21/2015    Years since quitting: 6.4    Passive exposure: Past   Smokeless tobacco: Never  Vaping Use   Vaping Use: Never used  Substance and Sexual Activity   Alcohol use: Not Currently    Comment: Rare   Drug use: Never   Sexual activity: Yes    Birth control/protection: Surgical  Other Topics Concern   Not on file  Social History Narrative   Live with partners [name]-19 years   3 children.    1-[name] (girl) 27 at home with her: 2 grandchildren in her home as well    [name]- 26 expecting   [name]-25      Enjoy: read,  play games on tablet, grandkids      Diet: Veggies, does not eat a lot of fried foods, enjoys chicken and fruit   Caffeine: sodas and coffee (with sugar)   Water: Does not drink a lot at all      Wears seat beat   Does not wear sunscreen   Smoke and carbon monoxide detectors   Does not use phone while driving    Social Determinants of Corporate investment banker Strain: Not on file  Food Insecurity: Not on file  Transportation Needs: Not on file  Physical Activity: Not on file  Stress: Not on file  Social Connections: Not on file  Intimate Partner Violence: Not on file    Outpatient Medications Prior to Visit  Medication Sig Dispense Refill   acetaminophen (TYLENOL) 500 MG tablet Take 500 mg by mouth every 4 (four) hours as needed for mild pain, moderate pain or headache.      albuterol (VENTOLIN HFA) 108 (90 Base) MCG/ACT  inhaler Inhale 2 puffs into the lungs every 6 (six) hours as needed for wheezing or shortness of breath. 8 g 0   azelastine (ASTELIN) 0.1 % nasal spray Place 1 spray into both nostrils at bedtime. Use in each nostril as directed 30 mL 12   diltiazem (CARDIZEM) 30 MG tablet Take 1 tablet (30 mg total) by mouth 2 (two) times daily. May take an additional 30 mg tablet daily as needed for palpitations 270 tablet 3   fluconazole (DIFLUCAN) 150 MG tablet Take 1 tablet (150 mg total) by mouth daily. Take one tablet if symptoms develop. If they do not improve within 72 hours, take the 2nd tablet. 2 tablet 0   fluticasone (FLONASE) 50 MCG/ACT nasal spray Place 1 spray into both nostrils at bedtime. 16 g 2   lisinopril (ZESTRIL) 5 MG tablet Take 5 mg by mouth daily.     LORazepam (ATIVAN) 0.5 MG tablet Take 0.5 mg by mouth daily as needed.     pantoprazole (PROTONIX) 40 MG tablet Take 1 tablet (40 mg total) by mouth daily. 90 tablet 3   rivaroxaban (XARELTO) 20 MG TABS tablet Take 1 tablet (20 mg total) by mouth daily with supper. 90 tablet 3   rosuvastatin (CRESTOR) 5 MG tablet Take 1 tablet (5 mg total) by mouth daily. 90 tablet 1   traZODone (DESYREL) 50 MG tablet Take 50-100 mg by mouth at bedtime.     Vitamin D, Ergocalciferol, (DRISDOL) 1.25 MG (50000 UNIT) CAPS capsule Take 1 capsule (50,000 Units total) by mouth every 7 (seven) days. 12 capsule 1   zolpidem (AMBIEN) 10 MG tablet Take 10 mg by mouth at bedtime as needed.     No facility-administered medications prior to visit.    No Known Allergies  ROS Review of Systems  Constitutional:  Negative for chills, fatigue and fever.  HENT:  Positive for congestion and rhinorrhea. Negative for sinus pressure and sinus pain.   Cardiovascular:  Negative for chest pain and palpitations.  Gastrointestinal:  Negative for abdominal pain, diarrhea, nausea and vomiting.  Endocrine: Negative for polydipsia, polyphagia and polyuria.  Genitourinary:  Negative  for frequency and urgency.  Musculoskeletal:  Negative for back pain and neck pain.  Skin:  Negative for rash and wound.  Neurological:  Negative for dizziness, weakness and numbness.  Hematological:  Does not bruise/bleed easily.  Psychiatric/Behavioral:  Negative for confusion, self-injury, sleep disturbance and suicidal ideas.      Objective:    Physical  Exam Constitutional:      Appearance: She is obese.  HENT:     Head: Normocephalic.     Right Ear: External ear normal.     Left Ear: External ear normal.     Nose: Congestion and rhinorrhea present.     Mouth/Throat:     Mouth: Mucous membranes are moist.  Eyes:     General:        Right eye: No discharge.        Left eye: No discharge.     Extraocular Movements: Extraocular movements intact.     Conjunctiva/sclera: Conjunctivae normal.     Pupils: Pupils are equal, round, and reactive to light.  Cardiovascular:     Rate and Rhythm: Normal rate and regular rhythm.     Pulses: Normal pulses.     Heart sounds: Normal heart sounds.  Pulmonary:     Effort: Pulmonary effort is normal.     Breath sounds: Normal breath sounds. No wheezing or rhonchi.  Abdominal:     General: Bowel sounds are normal. There is no distension.     Palpations: Abdomen is soft. There is no mass.     Hernia: No hernia is present.  Musculoskeletal:     Cervical back: No rigidity or tenderness.     Right lower leg: No edema.     Left lower leg: No edema.  Skin:    Capillary Refill: Capillary refill takes less than 2 seconds.     Findings: No lesion or rash.  Neurological:     Mental Status: She is alert and oriented to person, place, and time.     Cranial Nerves: No cranial nerve deficit or facial asymmetry.     Sensory: No sensory deficit.     Motor: No weakness, tremor or pronator drift.     Coordination: Romberg sign negative. Coordination normal. Finger-Nose-Finger Test normal.     Gait: Gait is intact.  Psychiatric:     Comments: Normal  affect    BP 117/82   Pulse 60   Ht  (1.651 m)   Wt 214 lb 12.8 oz (97.4 kg)   SpO2 93%   BMI 35.74 kg/m  Wt Readings from Last 3 Encounters:  08/30/21 214 lb 12.8 oz (97.4 kg)  08/26/21 213 lb 9.6 oz (96.9 kg)  08/04/21 223 lb 9.6 oz (101.4 kg)    Lab Results  Component Value Date   TSH 1.17 01/22/2021   Lab Results  Component Value Date   WBC 17.7 (H) 08/13/2021   HGB 12.6 08/13/2021   HCT 39.1 08/13/2021   MCV 84.8 08/13/2021   PLT 347 08/13/2021   Lab Results  Component Value Date   NA 133 (L) 08/13/2021   K 4.4 08/13/2021   CO2 21 (L) 08/13/2021   GLUCOSE 145 (H) 08/13/2021   BUN 12 08/13/2021   CREATININE 1.16 (H) 08/13/2021   BILITOT 0.5 04/20/2021   ALKPHOS 72 04/20/2021   AST 18 04/20/2021   ALT 12 04/20/2021   PROT 7.1 04/20/2021   ALBUMIN 3.9 04/20/2021   CALCIUM 9.4 08/13/2021   ANIONGAP 11 08/13/2021   GFR 53.13 (L) 04/20/2021   Lab Results  Component Value Date   CHOL 230 (H) 04/22/2019   Lab Results  Component Value Date   HDL 58 04/22/2019   Lab Results  Component Value Date   LDLCALC 152 (H) 04/22/2019   Lab Results  Component Value Date   TRIG 93 04/22/2019   Lab  Results  Component Value Date   CHOLHDL 4.0 04/22/2019   Lab Results  Component Value Date   HGBA1C 5.7 (H) 04/22/2019      Assessment & Plan:   Problem List Items Addressed This Visit       Other   Encounter for routine adult physical exam with abnormal findings    - Physical exam as documented. Counseling done about healthy lifestyle involving commitment to 150 minutes exercise per week, heart healthy diet, and attaining healthy weight. -The importance of adequate sleep also discussed. -Changes in health habits are decided on by the patient with goals and time frames  set for achieving them. -Immunization and cancer screening needs are specifically addressed at this visit.       No orders of the defined types were placed in this  encounter.   Follow-up: Return in about 3 months (around 11/30/2021).    Gilmore LarocheGloria  Tiyanna Larcom, FNP

## 2021-08-31 ENCOUNTER — Ambulatory Visit: Payer: 59 | Admitting: Cardiology

## 2021-09-01 NOTE — Progress Notes (Deleted)
Office Visit Note  Patient: Hailey Ross             Date of Birth: Aug 28, 1970           MRN: [id number]             PCP: Renee Rival, FNP Referring: Renee Rival, FNP Visit Date: 09/14/2021 Occupation: @GUAROCC @  Subjective:  No chief complaint on file.   History of Present Illness: Hailey Ross is a 51 y.o. female ***   Activities of Daily Living:  Patient reports morning stiffness for *** {minute/hour:19697}.   Patient {ACTIONS;DENIES/REPORTS:21021675::"Denies"} nocturnal pain.  Difficulty dressing/grooming: {ACTIONS;DENIES/REPORTS:21021675::"Denies"} Difficulty climbing stairs: {ACTIONS;DENIES/REPORTS:21021675::"Denies"} Difficulty getting out of chair: {ACTIONS;DENIES/REPORTS:21021675::"Denies"} Difficulty using hands for taps, buttons, cutlery, and/or writing: {ACTIONS;DENIES/REPORTS:21021675::"Denies"}  No Rheumatology ROS completed.   PMFS History:  Patient Active Problem List   Diagnosis Date Noted   Encounter for routine adult physical exam with abnormal findings 08/30/2021   CAP (community acquired pneumonia) 06/16/2021   Acute midline low back pain without sciatica 04/22/2021   Left lower quadrant abdominal pain 04/20/2021   Atypical chest pain 01/26/2021   Synovitis of left knee 01/19/2021   GERD (gastroesophageal reflux disease) 12/29/2020   Abdominal pain, epigastric 12/28/2020   Primary osteoarthritis of left knee 10/27/2020   Grief at loss of child 06/09/2020   Insomnia 06/09/2020   Right ankle swelling 02/05/2020   Encounter for examination following treatment at hospital 01/28/2020   Palpitations 01/28/2020   Menorrhagia with regular cycle 10/15/2019   OSA (obstructive sleep apnea) 10/08/2019   Right knee pain 08/29/2019   Positive ANA (antinuclear antibody) 08/29/2019   Essential hypertension 07/04/2019   Pain and swelling of right knee 07/04/2019   Hyperlipidemia 07/04/2019   Stage 2 chronic kidney disease 07/04/2019    Sarcoidosis of lung ([place]) 07/04/2019   Encounter for support and coordination of transition of care 07/04/2019   Daytime hypersomnolence 07/02/2019   Pulmonary hypertension ([place]) 07/02/2019   CKD (chronic kidney disease)    Bilateral pulmonary embolism ([place]) 06/18/2019   Lightheadedness 05/10/2019   Vitamin D deficiency 05/10/2019   Class 2 obesity due to excess calories with body mass index (BMI) of 37.0 to 37.9 in adult 04/16/2019   Sarcoidosis 05/04/2015   Bronchiectasis ([place]) 05/04/2015    Past Medical History:  Diagnosis Date   Acute cholecystitis 08/24/2016   Acute kidney injury superimposed on chronic kidney disease ([place]) 06/20/2019   Acute respiratory failure with hypoxia ([place]) 06/19/2019   Annual visit for general adult medical examination with abnormal findings 04/16/2019   Arthritis    right knee   Bronchiectasis ([place])    CKD (chronic kidney disease)    Iron deficiency    Lightheadedness 05/10/2019   PE (pulmonary thromboembolism) ([place]) 06/2019   Pre-syncope 04/16/2019   Sarcoidosis    Sarcoidosis    Sleep apnea    c pap   Smoker 02/12/2014    Family History  Problem Relation Age of Onset   Hypertension Mother    Cancer Mother    Colon polyps Mother    Healthy Sister    Healthy Sister    Healthy Sister    Healthy Sister    Healthy Son    Healthy Son    Healthy Daughter    Colon cancer Neg Hx    Esophageal cancer Neg Hx    Rectal cancer Neg Hx    Stomach cancer Neg Hx    Past Surgical History:  Procedure Laterality Date  CHOLECYSTECTOMY N/A 08/25/2016   Procedure: LAPAROSCOPIC CHOLECYSTECTOMY;  Surgeon: Coralie Keens, MD;  Location: [facility];  Service: General;  Laterality: N/A;   TUBAL LIGATION  02/1993   Social History   Social History Narrative   Live with partners [name]-19 years   3 children.    1-[name] (girl) 27 at home with her: 2 grandchildren in her home as well    [name]- 2 expecting   [name]-25      Enjoy: read, play games on tablet,  grandkids      Diet: Veggies, does not eat a lot of fried foods, enjoys chicken and fruit   Caffeine: sodas and coffee (with sugar)   Water: Does not drink a lot at all      Wears seat beat   Does not wear sunscreen   Smoke and carbon monoxide detectors   Does not use phone while driving    Immunization History  Administered Date(s) Administered   Influenza Inj Mdck Quad Pf 12/25/2019   Influenza,inj,Quad PF,6+ Mos 12/21/2018, 02/22/2021   Influenza,inj,Quad PF,6-35 Mos 01/10/2019   PFIZER(Purple Top)SARS-COV-2 Vaccination 08/14/2019, 09/10/2019, 02/07/2020   Pneumococcal Polysaccharide-23 02/12/2014   Tdap 02/12/2014     Objective: Vital Signs: There were no vitals taken for this visit.   Physical Exam   Musculoskeletal Exam: ***  CDAI Exam: CDAI Score: -- Patient Global: --; Provider Global: -- Swollen: --; Tender: -- Joint Exam 09/14/2021   No joint exam has been documented for this visit   There is currently no information documented on the homunculus. Go to the Rheumatology activity and complete the homunculus joint exam.  Investigation: No additional findings.  Imaging: DG Chest 2 View  Result Date: 08/26/2021 CLINICAL DATA:  Pneumonia. EXAM: CHEST - 2 VIEW COMPARISON:  Aug 11, 2021. FINDINGS: The heart size and mediastinal contours are within normal limits. Stable coarse interstitial densities are noted bilaterally consistent with history of sarcoidosis. No definite acute abnormality is noted. The visualized skeletal structures are unremarkable. IMPRESSION: Stable coarse interstitial densities are noted consistent with sarcoidosis. No acute abnormality is noted. Electronically Signed   By: Marijo Conception M.D.   On: 08/26/2021 10:08   DG Chest 2 View  Result Date: 08/11/2021 CLINICAL DATA:  Cough and shortness of several days.  Sarcoidosis. EXAM: CHEST - 2 VIEW COMPARISON:  07/03/2021 FINDINGS: Heart size is normal. Coarsening of interstitial lung markings again  seen, consistent with chronic pulmonary involvement by sarcoidosis. Mild patchy opacity is seen in the right lung base, suspicious for pneumonia. No evidence of pleural effusion. IMPRESSION: Mild patchy opacity in right lung base, suspicious for pneumonia. Chronic pulmonary involvement by sarcoidosis. Electronically Signed   By: Marlaine Hind M.D.   On: 08/11/2021 13:13   CT Angio Chest PE W and/or Wo Contrast  Result Date: 08/13/2021 CLINICAL DATA:  Concern for pulmonary embolism.  Sarcoid. EXAM: CT ANGIOGRAPHY CHEST WITH CONTRAST TECHNIQUE: Multidetector CT imaging of the chest was performed using the standard protocol during bolus administration of intravenous contrast. Multiplanar CT image reconstructions and MIPs were obtained to evaluate the vascular anatomy. RADIATION DOSE REDUCTION: This exam was performed according to the departmental dose-optimization program which includes automated exposure control, adjustment of the mA and/or kV according to patient size and/or use of iterative reconstruction technique. CONTRAST:  44mL OMNIPAQUE IOHEXOL 350 MG/ML SOLN COMPARISON:  Chest CT dated 12/23/2020. FINDINGS: Cardiovascular: There is no cardiomegaly or pericardial effusion. Mild atherosclerotic calcification of the aortic arch. No aneurysmal dilatation or dissection. The origins  of the great vessels of the aortic arch appear patent as visualized. No pulmonary artery embolus identified. Mediastinum/Nodes: Right hilar and mediastinal adenopathy relatively similar to prior CT in keeping with history of sarcoid. The esophagus and the thyroid gland are grossly unremarkable. No mediastinal fluid collection. Lungs/Pleura: Biapical predominant subpleural fibrosis with honeycombing and traction bronchiectasis. A cluster of ground-glass density in the right lower lobe (79/6) may represent progression of fibrosis although developing infiltrate is not excluded clinical correlation is recommended. There is no pleural  effusion pneumothorax. The central airways are patent. Upper Abdomen: Cholecystectomy. Musculoskeletal: No chest wall abnormality. No acute or significant osseous findings. Review of the MIP images confirms the above findings. IMPRESSION: 1. No CT evidence of pulmonary embolism. 2. Biapical predominant subpleural fibrosis with honeycombing and traction bronchiectasis. 3. A cluster of ground-glass density in the right lower lobe may represent progression of fibrosis although developing infiltrate is not excluded. 4. Right hilar and mediastinal adenopathy relatively similar to prior CT in keeping with history of sarcoid. 5. Aortic Atherosclerosis (ICD10-I70.0). Electronically Signed   By: Anner Crete M.D.   On: 08/13/2021 21:02    Recent Labs: Lab Results  Component Value Date   WBC 17.7 (H) 08/13/2021   HGB 12.6 08/13/2021   PLT 347 08/13/2021   NA 133 (L) 08/13/2021   K 4.4 08/13/2021   CL 101 08/13/2021   CO2 21 (L) 08/13/2021   GLUCOSE 145 (H) 08/13/2021   BUN 12 08/13/2021   CREATININE 1.16 (H) 08/13/2021   BILITOT 0.5 04/20/2021   ALKPHOS 72 04/20/2021   AST 18 04/20/2021   ALT 12 04/20/2021   PROT 7.1 04/20/2021   ALBUMIN 3.9 04/20/2021   CALCIUM 9.4 08/13/2021   GFRAA 62 09/23/2020   QFTBGOLD Negative 05/01/2015   QFTBGOLDPLUS NEGATIVE 11/08/2019    Speciality Comments: No specialty comments available.  Procedures:  No procedures performed Allergies: Patient has no known allergies.   Assessment / Plan:     Visit Diagnoses: Sarcoidosis  Positive ANA (antinuclear antibody)  Primary osteoarthritis of both hands  Trochanteric bursitis of left hip  Effusion, right knee  Chronic pain of left knee  Stage 2 chronic kidney disease  Pulmonary hypertension (HCC)  Bilateral pulmonary embolism (HCC)  Bronchiectasis without complication (HCC)  Mixed hyperlipidemia  Essential hypertension  Secondary hyperparathyroidism (HCC)  Vitamin D deficiency  OSA  (obstructive sleep apnea)  Orders: No orders of the defined types were placed in this encounter.  No orders of the defined types were placed in this encounter.   Face-to-face time spent with patient was *** minutes. Greater than 50% of time was spent in counseling and coordination of care.  Follow-Up Instructions: No follow-ups on file.   Ofilia Neas, PA-C  Note - This record has been created using Dragon software.  Chart creation errors have been sought, but may not always  have been located. Such creation errors do not reflect on  the standard of medical care.

## 2021-09-13 ENCOUNTER — Ambulatory Visit: Payer: 59 | Admitting: Cardiology

## 2021-09-14 ENCOUNTER — Ambulatory Visit: Payer: 59 | Admitting: Rheumatology

## 2021-09-14 DIAGNOSIS — M19042 Primary osteoarthritis, left hand: Secondary | ICD-10-CM

## 2021-09-14 DIAGNOSIS — M7062 Trochanteric bursitis, left hip: Secondary | ICD-10-CM

## 2021-09-14 DIAGNOSIS — I272 Pulmonary hypertension, unspecified: Secondary | ICD-10-CM

## 2021-09-14 DIAGNOSIS — M25461 Effusion, right knee: Secondary | ICD-10-CM

## 2021-09-14 DIAGNOSIS — G8929 Other chronic pain: Secondary | ICD-10-CM

## 2021-09-14 DIAGNOSIS — E782 Mixed hyperlipidemia: Secondary | ICD-10-CM

## 2021-09-14 DIAGNOSIS — J479 Bronchiectasis, uncomplicated: Secondary | ICD-10-CM

## 2021-09-14 DIAGNOSIS — R768 Other specified abnormal immunological findings in serum: Secondary | ICD-10-CM

## 2021-09-14 DIAGNOSIS — G4733 Obstructive sleep apnea (adult) (pediatric): Secondary | ICD-10-CM

## 2021-09-14 DIAGNOSIS — N182 Chronic kidney disease, stage 2 (mild): Secondary | ICD-10-CM

## 2021-09-14 DIAGNOSIS — D869 Sarcoidosis, unspecified: Secondary | ICD-10-CM

## 2021-09-14 DIAGNOSIS — I10 Essential (primary) hypertension: Secondary | ICD-10-CM

## 2021-09-14 DIAGNOSIS — N2581 Secondary hyperparathyroidism of renal origin: Secondary | ICD-10-CM

## 2021-09-14 DIAGNOSIS — E559 Vitamin D deficiency, unspecified: Secondary | ICD-10-CM

## 2021-09-14 DIAGNOSIS — I2699 Other pulmonary embolism without acute cor pulmonale: Secondary | ICD-10-CM

## 2021-09-15 NOTE — Progress Notes (Signed)
Office Visit Note  Patient: Hailey Ross             Date of Birth: [birth year]           MRN: [id number]             PCP: Renee Rival, FNP Referring: Renee Rival, FNP Visit Date: 09/23/2021 Occupation: @GUAROCC @  Subjective:  Intermittent left knee pain   History of Present Illness: Hailey Ross is a 51 y.o. female with history of sarcoidosis and osteoarthritis.  She is not currently taking any immunosuppressive agents.  She states that her left knee joint pain has improved since having a cortisone injection performed on 06/09/2021.  She has occasional discomfort but denies any joint swelling at this time.  She denies any mechanical symptoms at this time.  She is not experiencing any other joint pain or inflammation currently.  She has occasional pain on the lateral aspect of both hips especially when lying on her sides at night. She continues to see her ophthalmologist on a yearly basis.  She has chronic dry eyes but has not had any active inflammation.  She denies any eye redness. She continues to follow-up closely with her nephrologist Dr. Theador Hawthorne for management of chronic kidney disease stage IIIa. She remains under the care of [facility].  According to the patient she was diagnosed with pneumonia in April and had a second bout in May 2023.  She had a follow-up and repeat chest x-ray on 08/26/2021.  She is not experiencing any new or worsening pulmonary symptoms. She denies any rashes or EN lesions.   Activities of Daily Living:  Patient reports morning stiffness for 20 minutes.   Patient Reports nocturnal pain.  Difficulty dressing/grooming: Denies Difficulty climbing stairs: Reports Difficulty getting out of chair: Reports Difficulty using hands for taps, buttons, cutlery, and/or writing: Denies  Review of Systems  Constitutional:  Positive for fatigue.  HENT:  Negative for mouth sores, mouth dryness and nose dryness.   Eyes:  Negative for pain,  visual disturbance and dryness.  Respiratory:  Negative for cough, hemoptysis, shortness of breath and difficulty breathing.   Cardiovascular:  Negative for chest pain, palpitations, hypertension and swelling in legs/feet.  Gastrointestinal:  Negative for blood in stool, constipation and diarrhea.  Endocrine: Negative for excessive thirst and increased urination.  Genitourinary:  Negative for difficulty urinating and painful urination.  Musculoskeletal:  Positive for joint pain, joint pain, morning stiffness and muscle tenderness. Negative for joint swelling, myalgias, muscle weakness and myalgias.  Skin:  Negative for color change, pallor, rash, hair loss, nodules/bumps, skin tightness, ulcers and sensitivity to sunlight.  Allergic/Immunologic: Negative for susceptible to infections.  Neurological:  Negative for dizziness, numbness, headaches and weakness.  Hematological:  Positive for bruising/bleeding tendency. Negative for swollen glands.  Psychiatric/Behavioral:  Negative for depressed mood and sleep disturbance. The patient is not nervous/anxious.     PMFS History:  Patient Active Problem List   Diagnosis Date Noted   Encounter for routine adult physical exam with abnormal findings 08/30/2021   CAP (community acquired pneumonia) 06/16/2021   Acute midline low back pain without sciatica 04/22/2021   Left lower quadrant abdominal pain 04/20/2021   Atypical chest pain 01/26/2021   Synovitis of left knee 01/19/2021   GERD (gastroesophageal reflux disease) 12/29/2020   Abdominal pain, epigastric 12/28/2020   Primary osteoarthritis of left knee 10/27/2020   Grief at loss of child 06/09/2020   Insomnia 06/09/2020   Right ankle  swelling 02/05/2020   Encounter for examination following treatment at hospital 01/28/2020   Palpitations 01/28/2020   Menorrhagia with regular cycle 10/15/2019   OSA (obstructive sleep apnea) 10/08/2019   Right knee pain 08/29/2019   Positive ANA (antinuclear  antibody) 08/29/2019   Essential hypertension 07/04/2019   Pain and swelling of right knee 07/04/2019   Hyperlipidemia 07/04/2019   Stage 2 chronic kidney disease 07/04/2019   Sarcoidosis of lung ([place]) 07/04/2019   Encounter for support and coordination of transition of care 07/04/2019   Daytime hypersomnolence 07/02/2019   Pulmonary hypertension ([place]) 07/02/2019   CKD (chronic kidney disease)    Bilateral pulmonary embolism ([place]) 06/18/2019   Lightheadedness 05/10/2019   Vitamin D deficiency 05/10/2019   Class 2 obesity due to excess calories with body mass index (BMI) of 37.0 to 37.9 in adult 04/16/2019   Sarcoidosis 05/04/2015   Bronchiectasis ([place]) 05/04/2015    Past Medical History:  Diagnosis Date   Acute cholecystitis 08/24/2016   Acute kidney injury superimposed on chronic kidney disease ([place]) 06/20/2019   Acute respiratory failure with hypoxia ([place]) 06/19/2019   Annual visit for general adult medical examination with abnormal findings 04/16/2019   Arthritis    right knee   Bronchiectasis ([place])    CKD (chronic kidney disease)    Iron deficiency    Lightheadedness 05/10/2019   PE (pulmonary thromboembolism) ([place]) 06/2019   Pre-syncope 04/16/2019   Sarcoidosis    Sarcoidosis    Sleep apnea    c pap   Smoker 02/12/2014    Family History  Problem Relation Age of Onset   Hypertension Mother    Cancer Mother    Colon polyps Mother    Healthy Sister    Healthy Sister    Healthy Sister    Healthy Sister    Healthy Son    Healthy Son    Healthy Daughter    Colon cancer Neg Hx    Esophageal cancer Neg Hx    Rectal cancer Neg Hx    Stomach cancer Neg Hx    Past Surgical History:  Procedure Laterality Date   CHOLECYSTECTOMY N/A 08/25/2016   Procedure: LAPAROSCOPIC CHOLECYSTECTOMY;  Surgeon: Coralie Keens, MD;  Location: [facility];  Service: General;  Laterality: N/A;   TUBAL LIGATION  02/1993   Social History   Social History Narrative   Live with partners [name]-19  years   3 children.    1-[name] (girl) 27 at home with her: 2 grandchildren in her home as well    [name]- 80 expecting   [name]-25      Enjoy: read, play games on tablet, grandkids      Diet: Veggies, does not eat a lot of fried foods, enjoys chicken and fruit   Caffeine: sodas and coffee (with sugar)   Water: Does not drink a lot at all      Wears seat beat   Does not wear sunscreen   Smoke and carbon monoxide detectors   Does not use phone while driving    Immunization History  Administered Date(s) Administered   Influenza Inj Mdck Quad Pf 12/25/2019   Influenza,inj,Quad PF,6+ Mos 12/21/2018, 02/22/2021   Influenza,inj,Quad PF,6-35 Mos 01/10/2019   PFIZER(Purple Top)SARS-COV-2 Vaccination 08/14/2019, 09/10/2019, 02/07/2020   Pneumococcal Polysaccharide-23 02/12/2014   Tdap 02/12/2014     Objective: Vital Signs: BP 129/85 (BP Location: Left Arm, Patient Position: Sitting, Cuff Size: Normal)   Pulse 61   Resp 16   Ht 5\' 5"  (1.651 m)  Wt 216 lb (98 kg)   BMI 35.94 kg/m    Physical Exam Vitals and nursing note reviewed.  Constitutional:      Appearance: She is well-developed.  HENT:     Head: Normocephalic and atraumatic.  Eyes:     Conjunctiva/sclera: Conjunctivae normal.  Cardiovascular:     Rate and Rhythm: Normal rate and regular rhythm.     Heart sounds: Normal heart sounds.  Pulmonary:     Effort: Pulmonary effort is normal.     Breath sounds: Normal breath sounds.  Abdominal:     General: Bowel sounds are normal.     Palpations: Abdomen is soft.  Musculoskeletal:     Cervical back: Normal range of motion.  Skin:    General: Skin is warm and dry.     Capillary Refill: Capillary refill takes less than 2 seconds.  Neurological:     Mental Status: She is alert and oriented to person, place, and time.  Psychiatric:        Behavior: Behavior normal.      Musculoskeletal Exam: C-spine, thoracic spine, lumbar spine have good range of motion.  Shoulder  joints, elbow joints, wrist joints, MCPs, PIPs, DIPs have good range of motion with no synovitis.  She was able to make a complete fist bilaterally.  Hip joints have good range of motion with no groin pain.  Knee joints have good range of motion with mild warmth of the left knee.  No knee joint effusion noted.  Ankle joints have good range of motion with no tenderness or joint swelling.  CDAI Exam: CDAI Score: -- Patient Global: --; Provider Global: -- Swollen: --; Tender: -- Joint Exam 09/23/2021   No joint exam has been documented for this visit   There is currently no information documented on the homunculus. Go to the Rheumatology activity and complete the homunculus joint exam.  Investigation: No additional findings.  Imaging: DG Chest 2 View  Result Date: 08/26/2021 CLINICAL DATA:  Pneumonia. EXAM: CHEST - 2 VIEW COMPARISON:  Aug 11, 2021. FINDINGS: The heart size and mediastinal contours are within normal limits. Stable coarse interstitial densities are noted bilaterally consistent with history of sarcoidosis. No definite acute abnormality is noted. The visualized skeletal structures are unremarkable. IMPRESSION: Stable coarse interstitial densities are noted consistent with sarcoidosis. No acute abnormality is noted. Electronically Signed   By: Marijo Conception M.D.   On: 08/26/2021 10:08    Recent Labs: Lab Results  Component Value Date   WBC 17.7 (H) 08/13/2021   HGB 12.6 08/13/2021   PLT 347 08/13/2021   NA 133 (L) 08/13/2021   K 4.4 08/13/2021   CL 101 08/13/2021   CO2 21 (L) 08/13/2021   GLUCOSE 145 (H) 08/13/2021   BUN 12 08/13/2021   CREATININE 1.16 (H) 08/13/2021   BILITOT 0.5 04/20/2021   ALKPHOS 72 04/20/2021   AST 18 04/20/2021   ALT 12 04/20/2021   PROT 7.1 04/20/2021   ALBUMIN 3.9 04/20/2021   CALCIUM 9.4 08/13/2021   GFRAA 62 09/23/2020   QFTBGOLD Negative 05/01/2015   QFTBGOLDPLUS NEGATIVE 11/08/2019    Speciality Comments: No specialty comments  available.  Procedures:  No procedures performed Allergies: Patient has no known allergies.   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 Halford Chessman in 2020 with no recurrence.  She continues to follow-up at [facility].  She had 2 recent bouts of pneumonia requiring several rounds of  antibiotics.  Patient has a history of recurrent pneumonia.  Her most recent office visit note from 08/26/2021 was reviewed today in the office.  Chest x-ray on 08/26/2021 showed stable coarse interstitial densities consistent with sarcoidosis.  No acute abnormality was noted.  She is not experiencing any new or worsening pulmonary symptoms at this time. She continues to see her ophthalmologist on a yearly basis. No new rashes or EN lesions noted. Hx  of recurrent left knee joint effusion: Mild warmth of the left knee but no effusion was noted on examination today.  Patient had a cortisone injection on 06/09/21 in the left knee which alleviated her discomfort. No other signs of inflammatory arthritis at this time.   At her last office visit on 06/09/2021 we discussed adding Imuran.  She is not a good candidate for Methotrexate due to CKD stage 3a.  She does not want to take any immunosuppressive agents at this time. She will follow up in 6 months or sooner if needed.   Positive ANA (antinuclear antibody) - Low titer positive ANA, ENA negative, anticardiolipin negative, beta-2 GP 1 negative, lupus anticoagulant negative (lupus anticoagulant was positive once).    Primary osteoarthritis of both hands: She has minimal PIP and DIP thickening consistent with osteoarthritis of both hands.  No tenderness or inflammation was noted on examination today.  Trochanteric bursitis of both hips: She has tenderness palpation over bilateral trochanteric bursa.  Her symptoms are exacerbated by lying on her sides at night.  Discussed the importance of performing stretching exercises  daily.  Effusion, right knee: Followed by Dr. Erlinda Hong. No right knee joint effusion noted.   Chronic pain of left knee: Recurrent left knee joint effusion in the past.  She had a left knee joint cortisone injection performed on 06/09/2021 which read significant relief.  She has occasional discomfort in the left knee but has not had any recurrence of an effusion.  She has not required any over-the-counter products for symptomatic relief.  She is not experiencing any mechanical symptoms at this time.  On examination today she has mild warmth but no effusion.  She declined a repeat cortisone injection today.  She was advised to notify us if she develops increased joint pain or joint swelling.  Stage 3a chronic kidney disease ([place]): Followed by Dr. Theador Hawthorne.  CKD since 2017.  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 Toya Smothers office visit note on 09/15/21.  OV note reviewed today.  No proteinuria.   Other medical conditions are listed as follows:   Pulmonary hypertension ([place])  Bilateral pulmonary embolism ([place]): Unprovoked.  On anticoagulation-xarelto.  Followed by hematology.   Bronchiectasis without complication ([place])  Mixed hyperlipidemia  Essential hypertension: BP was 129/85 today in the office.   Secondary hyperparathyroidism ([place])  Vitamin D deficiency  OSA (obstructive sleep apnea)  Daytime hypersomnolence  Orders: No orders of the defined types were placed in this encounter.  No orders of the defined types were placed in this encounter.    Follow-Up Instructions: Return in about 6 months (around 03/25/2022) for Sarcoidosis, Osteoarthritis.   Ofilia Neas, PA-C  Note - This record has been created using Dragon software.  Chart creation errors have been sought, but may not always  have been located. Such creation errors do not reflect on  the standard of medical care.

## 2021-09-23 ENCOUNTER — Encounter: Payer: Self-pay | Admitting: Physician Assistant

## 2021-09-23 ENCOUNTER — Ambulatory Visit (INDEPENDENT_AMBULATORY_CARE_PROVIDER_SITE_OTHER): Payer: 59 | Admitting: Physician Assistant

## 2021-09-23 VITALS — BP 129/85 | HR 61 | Resp 16 | Ht 65.0 in | Wt 216.0 lb

## 2021-09-23 DIAGNOSIS — E559 Vitamin D deficiency, unspecified: Secondary | ICD-10-CM

## 2021-09-23 DIAGNOSIS — N1831 Chronic kidney disease, stage 3a: Secondary | ICD-10-CM

## 2021-09-23 DIAGNOSIS — I10 Essential (primary) hypertension: Secondary | ICD-10-CM

## 2021-09-23 DIAGNOSIS — D869 Sarcoidosis, unspecified: Secondary | ICD-10-CM | POA: Diagnosis not present

## 2021-09-23 DIAGNOSIS — M19042 Primary osteoarthritis, left hand: Secondary | ICD-10-CM

## 2021-09-23 DIAGNOSIS — E782 Mixed hyperlipidemia: Secondary | ICD-10-CM

## 2021-09-23 DIAGNOSIS — I272 Pulmonary hypertension, unspecified: Secondary | ICD-10-CM

## 2021-09-23 DIAGNOSIS — G8929 Other chronic pain: Secondary | ICD-10-CM

## 2021-09-23 DIAGNOSIS — M7062 Trochanteric bursitis, left hip: Secondary | ICD-10-CM

## 2021-09-23 DIAGNOSIS — R768 Other specified abnormal immunological findings in serum: Secondary | ICD-10-CM | POA: Diagnosis not present

## 2021-09-23 DIAGNOSIS — G4733 Obstructive sleep apnea (adult) (pediatric): Secondary | ICD-10-CM

## 2021-09-23 DIAGNOSIS — M19041 Primary osteoarthritis, right hand: Secondary | ICD-10-CM

## 2021-09-23 DIAGNOSIS — N2581 Secondary hyperparathyroidism of renal origin: Secondary | ICD-10-CM

## 2021-09-23 DIAGNOSIS — M7061 Trochanteric bursitis, right hip: Secondary | ICD-10-CM | POA: Diagnosis not present

## 2021-09-23 DIAGNOSIS — I2699 Other pulmonary embolism without acute cor pulmonale: Secondary | ICD-10-CM

## 2021-09-23 DIAGNOSIS — M25461 Effusion, right knee: Secondary | ICD-10-CM

## 2021-09-23 DIAGNOSIS — M25562 Pain in left knee: Secondary | ICD-10-CM

## 2021-09-23 DIAGNOSIS — J479 Bronchiectasis, uncomplicated: Secondary | ICD-10-CM

## 2021-09-23 DIAGNOSIS — G4719 Other hypersomnia: Secondary | ICD-10-CM

## 2021-09-23 DIAGNOSIS — N182 Chronic kidney disease, stage 2 (mild): Secondary | ICD-10-CM

## 2021-09-28 NOTE — Progress Notes (Unsigned)
NEUROLOGY FOLLOW UP OFFICE NOTE  Hailey Ross [id number]  Assessment/Plan:   Episodic lightheadedness/dizziness - not positional.  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  Found to have B12 deficiency.  ***     1  Check CTA of head to evaluate for vertebrobasilar insufficiency 2  EEG to evaluate for epileptiform discharges 3  NCV-EMG left upper extremity to evaluate for carpal tunnel syndrome 4 For further workup of neuropathy, check B12, folate, SPEP/IFE and TSH 5  Follow up after testing.     Subjective:  Hailey Ross is a 51 year old left-handed female with sarcoidosis, CKD and history of PE who follows up for dizziness and left arm numbness.     UPDATE: Workup for episodic dizziness/lightheadedness:  EEG on 02/15/2021 was normal.  CTA of head ***  Workup for left arm numbness:  NCV-EMG on 02/23/2021 showed evidence of mild carpal tunnel syndrome.  Advised to wear wrist splint.  ***  Workup for neuropathy:  Labs from 01/22/2021 revealed low B12 and folate of 172 and 5.7 respectively.  Advised to start OTC B12 and folic acid 400mg  daily.  Other labs unremarkable, including TSH 1.17 and normal SPEP/IFE  HISTORY: She began having dizzy spells in November 2020.  It is not positional and would come on suddenly.  It is a sensation of feeling "loopy" and would last for a couple of seconds.  Sometimes she may see black spots but no tunnel vision or double vision.  Sometimes she may have palpitations.  No headache, nausea, diaphoresis, focal numbness or weakness.  Frequency varies. It may occur frequently for several days and she may have days to weeks without episodes.  In January 2021, she had a coughing spell that caused her to momentarily loss consciousness for a couple of seconds.  She did not fall but was holding a mug and the next thing she saw it on the floor.  There was some  associated palpitations and tachycardia as well.  30 day cardiac event monitor in May 2021 showed no significant arrhythmias.  Echocardiogram in June 2021 showed EF 55-60% with no wall motion abnormalities, mildly elevated PASP of 35.3 mmHg, mildly dilated LA and RA and mild to moderate tricuspid regurgitation.  She saw neurology in November 2021.  MRI of brain with and without contrast on 02/25/2020 personally reviewed showed a partially empty sella but overall unremarkable study.  Carotid ultrasound on 03/02/2020 showed no hemodynamically significant stenosis.  Etiology believed to be multifactorial, related to dehydration, low blood pressure, menstrual cycle and pulmonary sarcoidosis.  She also had been experiencing atypical chest pain and palpitations.  She has followed up with cardiology.  Repeat echo overall unchanged.  Repeat cardiac event monitor showed 11 runs of SVT, longest lasting 9 beats with maximum rate of 148 but no significant sustained arrhythmias.  Lisinopril was stopped due to low blood pressure, but no change in the dizziness.   She also reports numbness in the left hand and arm.  Mostly notices it laying in bed.  No pain or weakness.  For a few months, she also notes numbness and tingling in her toes.  She reports treated for B12 deficiency last year but not currently.  She also reports tinnitus.  PAST MEDICAL HISTORY: Past Medical History:  Diagnosis Date   Acute cholecystitis 08/24/2016   Acute kidney injury superimposed on chronic kidney disease ([facility]) 06/20/2019   Acute respiratory failure with hypoxia (  H[facility]) 06/19/2019   Annual visit for general adult medical examination with abnormal findings 04/16/2019   Arthritis    right knee   Bronchiectasis ([facility])    CKD (chronic kidney disease)    Iron deficiency    Lightheadedness 05/10/2019   PE (pulmonary thromboembolism) ([facility]) 06/2019   Pre-syncope 04/16/2019   Sarcoidosis    Sarcoidosis    Sleep apnea    c pap   Smoker 02/12/2014     MEDICATIONS: Current Outpatient Medications on File Prior to Visit  Medication Sig Dispense Refill   acetaminophen (TYLENOL) 500 MG tablet Take 500 mg by mouth every 4 (four) hours as needed for mild pain, moderate pain or headache.      albuterol (VENTOLIN HFA) 108 (90 Base) MCG/ACT inhaler Inhale 2 puffs into the lungs every 6 (six) hours as needed for wheezing or shortness of breath. 8 g 0   azelastine (ASTELIN) 0.1 % nasal spray Place 1 spray into both nostrils at bedtime. Use in each nostril as directed 30 mL 12   diltiazem (CARDIZEM) 30 MG tablet Take 1 tablet (30 mg total) by mouth 2 (two) times daily. May take an additional 30 mg tablet daily as needed for palpitations 270 tablet 3   fluconazole (DIFLUCAN) 150 MG tablet Take 1 tablet (150 mg total) by mouth daily. Take one tablet if symptoms develop. If they do not improve within 72 hours, take the 2nd tablet. (Patient not taking: Reported on 09/23/2021) 2 tablet 0   fluticasone (FLONASE) 50 MCG/ACT nasal spray Place 1 spray into both nostrils at bedtime. 16 g 2   lisinopril (ZESTRIL) 5 MG tablet Take 5 mg by mouth daily.     LORazepam (ATIVAN) 0.5 MG tablet Take 0.5 mg by mouth daily as needed.     pantoprazole (PROTONIX) 40 MG tablet Take 1 tablet (40 mg total) by mouth daily. 90 tablet 3   prazosin (MINIPRESS) 1 MG capsule 1 mg if needed     rivaroxaban (XARELTO) 20 MG TABS tablet Take 1 tablet (20 mg total) by mouth daily with supper. 90 tablet 3   rosuvastatin (CRESTOR) 5 MG tablet Take 1 tablet (5 mg total) by mouth daily. (Patient not taking: Reported on 09/23/2021) 90 tablet 1   traZODone (DESYREL) 50 MG tablet Take 50-100 mg by mouth at bedtime.     Vitamin D, Ergocalciferol, (DRISDOL) 1.25 MG (50000 UNIT) CAPS capsule Take 1 capsule (50,000 Units total) by mouth every 7 (seven) days. (Patient not taking: Reported on 09/23/2021) 12 capsule 1   zolpidem (AMBIEN) 10 MG tablet Take 10 mg by mouth at bedtime as needed.     No  current facility-administered medications on file prior to visit.    ALLERGIES: No Known Allergies  FAMILY HISTORY: Family History  Problem Relation Age of Onset   Hypertension Mother    Cancer Mother    Colon polyps Mother    Healthy Sister    Healthy Sister    Healthy Sister    Healthy Sister    Healthy Son    Healthy Son    Healthy Daughter    Colon cancer Neg Hx    Esophageal cancer Neg Hx    Rectal cancer Neg Hx    Stomach cancer Neg Hx       Objective:  *** General: No acute distress.  Patient appears ***-groomed.   Head:  Normocephalic/atraumatic Eyes:  Fundi examined but not visualized Neck: supple, no paraspinal tenderness, full range of motion Heart:  Regular rate and rhythm Lungs:  Clear to auscultation bilaterally Back: No paraspinal tenderness Neurological Exam: alert and oriented to person, place, and time.  Speech fluent and not dysarthric, language intact.  CN II-XII intact. Bulk and tone normal, muscle strength 5/5 throughout.  Sensation to light touch intact.  Deep tendon reflexes 2+ throughout, toes downgoing.  Finger to nose testing intact.  Gait normal, Romberg negative.   Metta Clines, DO  CC: ***

## 2021-09-29 ENCOUNTER — Other Ambulatory Visit: Payer: 59

## 2021-09-29 ENCOUNTER — Ambulatory Visit (INDEPENDENT_AMBULATORY_CARE_PROVIDER_SITE_OTHER): Payer: 59 | Admitting: Neurology

## 2021-09-29 ENCOUNTER — Encounter: Payer: Self-pay | Admitting: Neurology

## 2021-09-29 VITALS — BP 117/82 | HR 66 | Ht 65.0 in | Wt 214.4 lb

## 2021-09-29 DIAGNOSIS — R200 Anesthesia of skin: Secondary | ICD-10-CM | POA: Diagnosis not present

## 2021-09-29 DIAGNOSIS — G5602 Carpal tunnel syndrome, left upper limb: Secondary | ICD-10-CM | POA: Diagnosis not present

## 2021-09-29 DIAGNOSIS — R202 Paresthesia of skin: Secondary | ICD-10-CM

## 2021-09-29 DIAGNOSIS — R42 Dizziness and giddiness: Secondary | ICD-10-CM

## 2021-09-29 DIAGNOSIS — E538 Deficiency of other specified B group vitamins: Secondary | ICD-10-CM

## 2021-09-29 LAB — B12 AND FOLATE PANEL
Folate: 8.8 ng/mL (ref >5.9)
Vitamin B-12: 243 pg/mL (ref 211–911)

## 2021-09-29 MED ORDER — NORTRIPTYLINE HCL 10 MG PO CAPS: 10.0000 mg | ORAL_CAPSULE | Freq: Every day | ORAL | 5 refills | Status: DC

## 2021-09-29 NOTE — Patient Instructions (Signed)
Stop trazodone.  Start nortriptyline 10mg  at bedtime.  If the dizzy spells are migraine, we can see if the episodes decrease or resolve.  If no improvement in 6 weeks, contact me and we can increase dose Recheck B12 and folate levels Wear wrist splint on left wrist at night Follow up 5 months.

## 2021-11-19 ENCOUNTER — Emergency Department (HOSPITAL_BASED_OUTPATIENT_CLINIC_OR_DEPARTMENT_OTHER)
Admission: EM | Admit: 2021-11-19 | Discharge: 2021-11-19 | Disposition: A | Discharge status: Home / Self Care | Payer: Self-pay | Attending: Emergency Medicine | Admitting: Emergency Medicine

## 2021-11-19 ENCOUNTER — Emergency Department (HOSPITAL_BASED_OUTPATIENT_CLINIC_OR_DEPARTMENT_OTHER): Payer: Self-pay | Admitting: Radiology

## 2021-11-19 ENCOUNTER — Encounter (HOSPITAL_BASED_OUTPATIENT_CLINIC_OR_DEPARTMENT_OTHER): Payer: Self-pay | Admitting: Emergency Medicine

## 2021-11-19 ENCOUNTER — Ambulatory Visit: Payer: 59

## 2021-11-19 DIAGNOSIS — Z7901 Long term (current) use of anticoagulants: Secondary | ICD-10-CM | POA: Insufficient documentation

## 2021-11-19 DIAGNOSIS — R040 Epistaxis: Secondary | ICD-10-CM | POA: Insufficient documentation

## 2021-11-19 LAB — CBC
HCT: 40.7 % (ref 36.0–46.0)
Hemoglobin: 12.9 g/dL (ref 12.0–15.0)
MCH: 26.7 pg (ref 26.0–34.0)
MCHC: 31.7 g/dL (ref 30.0–36.0)
MCV: 84.1 fL (ref 80.0–100.0)
Platelets: 324 10*3/uL (ref 150–400)
RBC: 4.84 MIL/uL (ref 3.87–5.11)
RDW: 13.2 % (ref 11.5–15.5)
WBC: 9.3 10*3/uL (ref 4.0–10.5)
nRBC: 0 % (ref 0.0–0.2)

## 2021-11-19 LAB — COMPREHENSIVE METABOLIC PANEL
ALT: 15 U/L (ref 0–44)
AST: 19 U/L (ref 15–41)
Albumin: 3.8 g/dL (ref 3.5–5.0)
Alkaline Phosphatase: 75 U/L (ref 38–126)
Anion gap: 9 (ref 5–15)
BUN: 15 mg/dL (ref 6–20)
CO2: 27 mmol/L (ref 22–32)
Calcium: 9.3 mg/dL (ref 8.9–10.3)
Chloride: 105 mmol/L (ref 98–111)
Creatinine, Ser: 1.17 mg/dL — ABNORMAL HIGH (ref 0.44–1.00)
GFR, Estimated: 56 mL/min — ABNORMAL LOW (ref >60)
Glucose, Bld: 106 mg/dL — ABNORMAL HIGH (ref 70–99)
Potassium: 4.3 mmol/L (ref 3.5–5.1)
Sodium: 141 mmol/L (ref 135–145)
Total Bilirubin: 0.5 mg/dL (ref 0.3–1.2)
Total Protein: 7.3 g/dL (ref 6.5–8.1)

## 2021-11-19 MED ORDER — OXYMETAZOLINE HCL 0.05 % NA SOLN
1.0000 | Freq: Once | NASAL | Status: AC
  Administered 2021-11-19: 1 via NASAL
  Filled 2021-11-19: qty 30

## 2021-11-19 NOTE — ED Notes (Signed)
Patient Alert and oriented to baseline. Stable and ambulatory to baseline. Patient verbalized understanding of the discharge instructions.  Patient belongings were taken by the patient.   

## 2021-11-19 NOTE — ED Provider Notes (Signed)
[facility] EMERGENCY DEPT Provider Note   CSN: [id number] Arrival date & time: 11/19/21  5456     History  Chief Complaint  Patient presents with   Epistaxis    Hailey Ross is a 51 y.o. female presenting to the ED with complaint of nosebleed.  This is right-sided ongoing intermittently for 2 days.  She is on Eliquis for PE.  Has a history of sarcoidosis.  Reports she did have nosebleed problems in the past and feels that this may related to dry air in the house.  She does sleep with CPAP.  No active bleeding.  She separately reports that she was concerned because she noted she was coughing up blood in the morning, wonders whether she may have swallowed or aspirated some blood last night.  HPI     Home Medications Prior to Admission medications   Medication Sig Start Date End Date Taking? Authorizing Provider  acetaminophen (TYLENOL) 500 MG tablet Take 500 mg by mouth every 4 (four) hours as needed for mild pain, moderate pain or headache.     [provider]  albuterol (VENTOLIN HFA) 108 (90 Base) MCG/ACT inhaler Inhale 2 puffs into the lungs every 6 (six) hours as needed for wheezing or shortness of breath. 08/11/21   Leath-Warren, Sadie Haber, NP  azelastine (ASTELIN) 0.1 % nasal spray Place 1 spray into both nostrils at bedtime. Use in each nostril as directed 08/04/21   Coralyn Helling, MD  diltiazem (CARDIZEM) 30 MG tablet Take 1 tablet (30 mg total) by mouth 2 (two) times daily. May take an additional 30 mg tablet daily as needed for palpitations 07/06/21   Antoine Poche, MD  fluticasone [facility]) 50 MCG/ACT nasal spray Place 1 spray into both nostrils at bedtime. 08/04/21   Coralyn Helling, MD  lisinopril (ZESTRIL) 5 MG tablet Take 5 mg by mouth daily. 03/12/21   [provider]  LORazepam (ATIVAN) 0.5 MG tablet Take 0.5 mg by mouth daily as needed. 01/21/21   [provider]  nortriptyline (PAMELOR) 10 MG capsule Take 1 capsule (10 mg  total) by mouth at bedtime. 09/29/21   Everlena Cooper, Adam R, DO  pantoprazole (PROTONIX) 40 MG tablet Take 1 tablet (40 mg total) by mouth daily. 12/29/20   Parrett, Virgel Bouquet, NP  prazosin (MINIPRESS) 1 MG capsule 1 mg if needed 07/27/20   [provider]  rivaroxaban (XARELTO) 20 MG TABS tablet Take 1 tablet (20 mg total) by mouth daily with supper. 06/16/21   Parrett, Virgel Bouquet, NP  Vitamin D, Ergocalciferol, (DRISDOL) 1.25 MG (50000 UNIT) CAPS capsule Take 1 capsule (50,000 Units total) by mouth every 7 (seven) days. 12/22/20   Heather Roberts, NP  zolpidem (AMBIEN) 10 MG tablet Take 10 mg by mouth at bedtime as needed. 03/18/21   [provider]      Allergies    Patient has no known allergies.    Review of Systems   Review of Systems  Physical Exam Updated Vital Signs BP (!) 142/92   Pulse (!) 51   Temp 98.7 F (37.1 C) (Oral)   Resp 17   SpO2 98%  Physical Exam Constitutional:      General: She is not in acute distress. HENT:     Head: Normocephalic and atraumatic.     Comments: Small punctate lesion in the right internal naris not actively bleeding, consistent with capillary bleed prior Eyes:     Conjunctiva/sclera: Conjunctivae normal.     Pupils: Pupils  are equal, round, and reactive to light.  Cardiovascular:     Rate and Rhythm: Normal rate and regular rhythm.  Pulmonary:     Effort: Pulmonary effort is normal. No respiratory distress.  Abdominal:     General: There is no distension.     Tenderness: There is no abdominal tenderness.  Skin:    General: Skin is warm and dry.  Neurological:     General: No focal deficit present.     Mental Status: She is alert and oriented to person, place, and time. Mental status is at baseline.  Psychiatric:        Mood and Affect: Mood normal.        Behavior: Behavior normal.     ED Results / Procedures / Treatments   Labs (all labs ordered are listed, but only abnormal results are displayed) Labs Reviewed   COMPREHENSIVE METABOLIC PANEL - Abnormal; Notable for the following components:      Result Value   Glucose, Bld 106 (*)    Creatinine, Ser 1.17 (*)    GFR, Estimated 56 (*)    All other components within normal limits  CBC    EKG None  Radiology DG Chest 2 View  Result Date: 11/19/2021 CLINICAL DATA:  coughing up blood EXAM: CHEST - 2 VIEW COMPARISON:  Aug 26, 2021 FINDINGS: The heart size and mediastinal contours are stable. Again seen are the coarse interstitial densities and are more prominent at the bilateral upper lungs and lung bases without significant interval change. Mild prominence of the hila of likely adenopathy. No consolidation, pleural effusion or significant vascular congestion. The visualized skeletal structures are unremarkable. IMPRESSION: There are coarse interstitial density seen consistent with sarcoidosis without significant interval change. No consolidation or pleural effusion. Electronically Signed   By: Marjo Bicker M.D.   On: 11/19/2021 10:50    Procedures Procedures    Medications Ordered in ED Medications  oxymetazoline (AFRIN) 0.05 % nasal spray 1 spray (1 spray Each Nare Given 11/19/21 1153)    ED Course/ Medical Decision Making/ A&P                           Medical Decision Making Amount and/or Complexity of Data Reviewed Labs: ordered. Radiology: ordered.  Risk OTC drugs.   Patient is a suspected anterior nosebleed, no active bleeding on exam.  We discussed cauterization of the prior bleeding site versus Afrin and watchful waiting and she would prefer the latter option.  I think this is reasonable.  I will have her hold her Xarelto only tonight.  Then resume tomorrow.  She can also apply Vaseline twice a day to keep her nose moist.  I strongly suspect her "coughing up blood" and very small quantities in the morning is related to aspiration overnight from her bleeding site.  She does not have persistent hemoptysis.  She is already  anticoagulated for PE.  I do not believe need emergent PE study at this time.  Her vital signs otherwise look okay.  She is hypertensive though.        Final Clinical Impression(s) / ED Diagnoses Final diagnoses:  Epistaxis    Rx / DC Orders ED Discharge Orders     None         Tylan Kinn, Kermit Balo, MD 11/19/21 1300

## 2021-11-19 NOTE — ED Triage Notes (Signed)
Pt here from home with c/o nose bleeds on and off for the last 2 days . Pt is on blood thinners for old clots in her lungs 2 years ago

## 2021-11-19 NOTE — Discharge Instructions (Signed)
Spray 1 squirt of Afrin in each nostril at night for the next 3 nights.  We described how to [facility] a small amount of Vaseline on the inside of each nose gently twice a day to keep your nose moist.  Hailey Ross tends to cause worsening nosebleeds.  If your nosebleed returns, spray the Afrin in each nose, then firmly pinch the soft part of your nose together for 10 minutes without letting go as we discussed.

## 2021-11-30 ENCOUNTER — Inpatient Hospital Stay: Payer: Self-pay

## 2021-11-30 ENCOUNTER — Ambulatory Visit: Payer: 59 | Admitting: Nurse Practitioner

## 2021-11-30 ENCOUNTER — Other Ambulatory Visit: Payer: Self-pay | Admitting: *Deleted

## 2021-11-30 MED ORDER — DILTIAZEM HCL 30 MG PO TABS: 30.0000 mg | ORAL_TABLET | Freq: Two times a day (BID) | ORAL | 3 refills | Status: DC

## 2021-12-07 ENCOUNTER — Ambulatory Visit: Payer: 59 | Admitting: Hematology

## 2021-12-15 ENCOUNTER — Other Ambulatory Visit: Payer: Self-pay

## 2021-12-15 DIAGNOSIS — Z1231 Encounter for screening mammogram for malignant neoplasm of breast: Secondary | ICD-10-CM

## 2021-12-17 ENCOUNTER — Ambulatory Visit (INDEPENDENT_AMBULATORY_CARE_PROVIDER_SITE_OTHER): Payer: 59 | Admitting: Nurse Practitioner

## 2021-12-17 ENCOUNTER — Encounter: Payer: Self-pay | Admitting: Nurse Practitioner

## 2021-12-17 VITALS — BP 132/88 | HR 71 | Ht 65.0 in | Wt 217.0 lb

## 2021-12-17 DIAGNOSIS — Z23 Encounter for immunization: Secondary | ICD-10-CM

## 2021-12-17 DIAGNOSIS — D869 Sarcoidosis, unspecified: Secondary | ICD-10-CM

## 2021-12-17 DIAGNOSIS — E782 Mixed hyperlipidemia: Secondary | ICD-10-CM | POA: Diagnosis not present

## 2021-12-17 DIAGNOSIS — I10 Essential (primary) hypertension: Secondary | ICD-10-CM | POA: Diagnosis not present

## 2021-12-17 DIAGNOSIS — N1831 Chronic kidney disease, stage 3a: Secondary | ICD-10-CM

## 2021-12-17 DIAGNOSIS — R5383 Other fatigue: Secondary | ICD-10-CM

## 2021-12-17 DIAGNOSIS — E559 Vitamin D deficiency, unspecified: Secondary | ICD-10-CM

## 2021-12-17 MED ORDER — LISINOPRIL 5 MG PO TABS: 5.0000 mg | ORAL_TABLET | Freq: Every day | ORAL | 1 refills | Status: DC

## 2021-12-17 NOTE — Assessment & Plan Note (Signed)
Last vitamin D Lab Results  Component Value Date   VD25OH 17 (L) 04/22/2019  Check vitamin D levels Currently not on vitamin D supplement

## 2021-12-17 NOTE — Assessment & Plan Note (Signed)
Patient educated on CDC recommendation for the vaccine. Verbal consent was obtained from the patient, vaccine administered by nurse, no sign of adverse reactions noted at this time. Patient education on arm soreness and use of tylenol orfor this patient  was discussed. Patient educated on the signs and symptoms of adverse effect and advise to contact the office if they occur

## 2021-12-17 NOTE — Patient Instructions (Addendum)
Flu vaccine and shingles   It is important that you exercise regularly at least 30 minutes 5 times a week.  Think about what you will eat, plan ahead. Choose " clean, green, fresh or frozen" over canned, processed or packaged foods which are more sugary, salty and fatty. 70 to 75% of food eaten should be vegetables and fruit. Three meals at set times with snacks allowed between meals, but they must be fruit or vegetables. Aim to eat over a 12 hour period , example 7 am to 7 pm, and STOP after  your last meal of the day. Drink water,generally about 64 ounces per day, no other drink is as healthy. Fruit juice is best enjoyed in a healthy way, by EATING the fruit.  Thanks for choosing [facility], we consider it a privelige to serve you.

## 2021-12-17 NOTE — Addendum Note (Signed)
Addended by: Herbie Saxon on: 12/17/2021 10:02 AM   Modules accepted: Orders

## 2021-12-17 NOTE — Assessment & Plan Note (Addendum)
BP Readings from Last 3 Encounters:  12/17/21 132/88  11/19/21 (!) 142/92  09/29/21 117/82  Currently on Cardizem 30mg   BID  Lisinopril 5 mg daily refilled New current medication DASH diet advised engage in regular moderate exercises at least 150 minutes weekly Patient collaboration with nephrologist and cardiology. Lab Results  Component Value Date   NA 141 11/19/2021   K 4.3 11/19/2021   CO2 27 11/19/2021   GLUCOSE 106 (H) 11/19/2021   BUN 15 11/19/2021   CREATININE 1.17 (H) 11/19/2021   CALCIUM 9.3 11/19/2021   GFRNONAA 56 (L) 11/19/2021

## 2021-12-17 NOTE — Assessment & Plan Note (Addendum)
Check vitamin D levels States that she is Currently on vitamin B12 Lab Results  Component Value Date   WBC 9.3 11/19/2021   HGB 12.9 11/19/2021   HCT 40.7 11/19/2021   MCV 84.1 11/19/2021   PLT 324 11/19/2021

## 2021-12-17 NOTE — Assessment & Plan Note (Signed)
Lab Results  Component Value Date   NA 141 11/19/2021   K 4.3 11/19/2021   CO2 27 11/19/2021   GLUCOSE 106 (H) 11/19/2021   BUN 15 11/19/2021   CREATININE 1.17 (H) 11/19/2021   CALCIUM 9.3 11/19/2021   GFRNONAA 56 (L) 11/19/2021  stable  chronic medical condition Lisinopril 5 mg daily refilled Avoid NSAIDs and other nephrotoxic agents Patient encouraged to maintain close follow-up with nephrologist she verbalized understanding

## 2021-12-17 NOTE — Assessment & Plan Note (Addendum)
Lab Results  Component Value Date   CHOL 230 (H) 04/22/2019   HDL 58 04/22/2019   LDLCALC 152 (H) 04/22/2019   TRIG 93 04/22/2019   CHOLHDL 4.0 04/22/2019   Currently not on medication Check lipid panel

## 2021-12-17 NOTE — Assessment & Plan Note (Signed)
Patient educated on CDC recommendation for the shingles  vaccine. Verbal consent was obtained from the patient, vaccine administered by nurse, no sign of adverse reactions noted at this time. Patient education on arm soreness and use of tylenol orfor this patient  was discussed. Patient educated on the signs and symptoms of adverse effect and advise to contact the office if they occur.  

## 2021-12-17 NOTE — Progress Notes (Signed)
Hailey Ross     MRN: [id number]      DOB: [birth year]   HPI Hailey Ross with past medical history of bilateral pulmonary embolism, essential hypertension, sarcoidosis of lung, GERD, CKD, hyperlipidemia, vitamin D deficiency, vitamin B12 deficiency folate deficiency is here for follow up and re-evaluation of chronic medical conditions, medication management   Feelings of lighthedaedeness comes and goes, followed by neurologist. Leanora Ivanoff nortriptyline 10 mg at bedtime  Has been feeling more titred since the past 3 weeks Hailey Ross denies fever, chills  Hyperlipidemia .stated that Hailey Ross was on med for hyperlipdemia in the past , but did not get med refilled.   Has an upcoming appointment with GYN for PAP , has upcoming mamaogram    ROS Denies recent fever or chills. Denies sinus pressure, nasal congestion, ear pain or sore throat. Denies chest congestion, productive cough or wheezing. Denies chest pains, palpitations and leg swelling Denies abdominal pain, nausea, vomiting,diarrhea or constipation.   Denies dysuria, frequency, hesitancy or incontinence. Denies joint pain, swelling and limitation in mobility. Denies depression, anxiety or insomnia.    PE  BP 132/88 (BP Location: Left Arm, Patient Position: Sitting, Cuff Size: Large)   Pulse 71   Ht 5\' 5"  (1.651 m)   Wt 217 lb (98.4 kg)   SpO2 95%   BMI 36.11 kg/m   Patient alert and oriented and in no cardiopulmonary distress.  Chest: Clear to auscultation bilaterally.  CVS: S1, S2 no murmurs, no S3.Regular rate.  ABD: Soft non tender.   Ext: No edema  MS: Adequate ROM spine, shoulders, hips and knees.  Psych: Good eye contact, normal affect. Memory intact not anxious or depressed appearing.  CNS: CN 2-12 intact, power,  normal throughout.no focal deficits noted.   Assessment & Plan  Hyperlipidemia Lab Results  Component Value Date   CHOL 230 (H) 04/22/2019   HDL 58 04/22/2019   LDLCALC 152 (H) 04/22/2019   TRIG 93  04/22/2019   CHOLHDL 4.0 04/22/2019   Currently not on medication Check lipid panel  Essential hypertension BP Readings from Last 3 Encounters:  12/17/21 132/88  11/19/21 (!) 142/92  09/29/21 117/82  Currently on Cardizem 30mg   BID  Lisinopril 5 mg daily refilled New current medication DASH diet advised engage in regular moderate exercises at least 150 minutes weekly Patient collaboration with nephrologist and cardiology. Lab Results  Component Value Date   NA 141 11/19/2021   K 4.3 11/19/2021   CO2 27 11/19/2021   GLUCOSE 106 (H) 11/19/2021   BUN 15 11/19/2021   CREATININE 1.17 (H) 11/19/2021   CALCIUM 9.3 11/19/2021   GFRNONAA 56 (L) 11/19/2021    CKD (chronic kidney disease) Lab Results  Component Value Date   NA 141 11/19/2021   K 4.3 11/19/2021   CO2 27 11/19/2021   GLUCOSE 106 (H) 11/19/2021   BUN 15 11/19/2021   CREATININE 1.17 (H) 11/19/2021   CALCIUM 9.3 11/19/2021   GFRNONAA 56 (L) 11/19/2021  stable  chronic medical condition Lisinopril 5 mg daily refilled Avoid NSAIDs and other nephrotoxic agents Patient encouraged to maintain close follow-up with nephrologist Hailey Ross verbalized understanding  Sarcoidosis Chronic condition well-controlled On albuterol inhaler 2 puffs every 6 hours as needed CPAP at night Patient encouraged to maintain close follow-up with pulmonologist  Vitamin D deficiency Last vitamin D Lab Results  Component Value Date   VD25OH 17 (L) 04/22/2019  Check vitamin D levels Currently not on vitamin D supplement  Need for immunization against influenza  Patient educated on CDC recommendation for the vaccine. Verbal consent was obtained from the patient, vaccine administered by nurse, no sign of adverse reactions noted at this time. Patient education on arm soreness and use of tylenol orfor this patient  was discussed. Patient educated on the signs and symptoms of adverse effect and advise to contact the office if they  occur  Immunization due Patient educated on CDC recommendation for the shingles  vaccine. Verbal consent was obtained from the patient, vaccine administered by nurse, no sign of adverse reactions noted at this time. Patient education on arm soreness and use of tylenol orfor this patient  was discussed. Patient educated on the signs and symptoms of adverse effect and advise to contact the office if they occur  Fatigue Check vitamin D levels States that Hailey Ross is Currently on vitamin B12 Lab Results  Component Value Date   WBC 9.3 11/19/2021   HGB 12.9 11/19/2021   HCT 40.7 11/19/2021   MCV 84.1 11/19/2021   PLT 324 11/19/2021

## 2021-12-17 NOTE — Assessment & Plan Note (Signed)
Chronic condition well-controlled On albuterol inhaler 2 puffs every 6 hours as needed CPAP at night Patient encouraged to maintain close follow-up with pulmonologist

## 2021-12-18 LAB — LIPID PANEL
Chol/HDL Ratio: 3.4 ratio (ref 0.0–4.4)
Cholesterol, Total: 237 mg/dL — ABNORMAL HIGH (ref 100–199)
HDL: 69 mg/dL (ref >39)
LDL Chol Calc (NIH): 156 mg/dL — ABNORMAL HIGH (ref 0–99)
Triglycerides: 70 mg/dL (ref 0–149)
VLDL Cholesterol Cal: 12 mg/dL (ref 5–40)

## 2021-12-18 LAB — VITAMIN D 25 HYDROXY (VIT D DEFICIENCY, FRACTURES): Vit D, 25-Hydroxy: 36.4 ng/mL (ref 30.0–100.0)

## 2021-12-20 ENCOUNTER — Inpatient Hospital Stay: Payer: 59 | Attending: Hematology | Admitting: *Deleted

## 2021-12-20 DIAGNOSIS — I2699 Other pulmonary embolism without acute cor pulmonale: Secondary | ICD-10-CM

## 2021-12-20 DIAGNOSIS — Z7901 Long term (current) use of anticoagulants: Secondary | ICD-10-CM | POA: Diagnosis not present

## 2021-12-20 DIAGNOSIS — Z87891 Personal history of nicotine dependence: Secondary | ICD-10-CM | POA: Insufficient documentation

## 2021-12-20 DIAGNOSIS — D869 Sarcoidosis, unspecified: Secondary | ICD-10-CM | POA: Diagnosis not present

## 2021-12-20 DIAGNOSIS — Z86711 Personal history of pulmonary embolism: Secondary | ICD-10-CM | POA: Insufficient documentation

## 2021-12-20 DIAGNOSIS — Z79899 Other long term (current) drug therapy: Secondary | ICD-10-CM | POA: Insufficient documentation

## 2021-12-20 LAB — CBC WITH DIFFERENTIAL/PLATELET
Abs Immature Granulocytes: 0.02 10*3/uL (ref 0.00–0.07)
Basophils Absolute: 0 10*3/uL (ref 0.0–0.1)
Basophils Relative: 1 %
Eosinophils Absolute: 0.2 10*3/uL (ref 0.0–0.5)
Eosinophils Relative: 2 %
HCT: 40 % (ref 36.0–46.0)
Hemoglobin: 12.6 g/dL (ref 12.0–15.0)
Immature Granulocytes: 0 %
Lymphocytes Relative: 28 %
Lymphs Abs: 2.4 10*3/uL (ref 0.7–4.0)
MCH: 27 pg (ref 26.0–34.0)
MCHC: 31.5 g/dL (ref 30.0–36.0)
MCV: 85.8 fL (ref 80.0–100.0)
Monocytes Absolute: 0.6 10*3/uL (ref 0.1–1.0)
Monocytes Relative: 7 %
Neutro Abs: 5.2 10*3/uL (ref 1.7–7.7)
Neutrophils Relative %: 62 %
Platelets: 278 10*3/uL (ref 150–400)
RBC: 4.66 MIL/uL (ref 3.87–5.11)
RDW: 14 % (ref 11.5–15.5)
WBC: 8.4 10*3/uL (ref 4.0–10.5)
nRBC: 0 % (ref 0.0–0.2)

## 2021-12-20 LAB — D-DIMER, QUANTITATIVE: D-Dimer, Quant: <0.27 ug/mL-FEU (ref 0.00–0.50)

## 2021-12-22 ENCOUNTER — Encounter: Payer: Self-pay | Admitting: *Deleted

## 2021-12-26 NOTE — Progress Notes (Unsigned)
[place], [place] [address]   CLINIC:  Medical Oncology/Hematology  PCP:  Alvira Monday, [address] [place] [address] [phone]   REASON FOR VISIT:  Follow-up for unprovoked pulmonary embolism  CURRENT THERAPY: Xarelto  INTERVAL HISTORY:  [name] 51 y.o. female returns for routine follow-up of unprovoked pulmonary embolism.  She was last seen by Dr. Delton Coombes on 11/30/2020.  At today's visit, she reports feeling fairly well.  No recent hospitalizations, surgeries, or changes in baseline health status.  She denies any symptoms of recurrent DVT or PE.  She has intermittent right knee swelling, but denies any other peripheral edema, pain, or erythema.  She has intermittent cough and shortness of breath from her sarcoidosis but denies any changes in her baseline respiratory status.  No chest pain or palpitations.  She continues to take Xarelto daily as prescribed.  She had some epistaxis last month with associated hemoptysis from postnasal blood drainage.  No other major bleeding events.  She has 50% energy and 100% appetite. She endorses that she is maintaining a stable weight.   REVIEW OF SYSTEMS:  Review of Systems  Constitutional:  Positive for fatigue. Negative for appetite change, chills, diaphoresis, fever and unexpected weight change.  HENT:   Negative for lump/mass and nosebleeds.   Eyes:  Negative for eye problems.  Respiratory:  Positive for cough and shortness of breath. Negative for hemoptysis.   Cardiovascular:  Negative for chest pain, leg swelling and palpitations.  Gastrointestinal:  Negative for abdominal pain, blood in stool, constipation, diarrhea, nausea and vomiting.  Genitourinary:  Negative for hematuria.   Skin: Negative.   Neurological:  Positive for dizziness and numbness. Negative for headaches and light-headedness.  Hematological:  Does not bruise/bleed easily.      PAST MEDICAL/SURGICAL  HISTORY:  Past Medical History:  Diagnosis Date   Acute cholecystitis 08/24/2016   Acute kidney injury superimposed on chronic kidney disease ([facility]) 06/20/2019   Acute respiratory failure with hypoxia ([facility]) 06/19/2019   Annual visit for general adult medical examination with abnormal findings 04/16/2019   Arthritis    right knee   Bronchiectasis ([facility])    CKD (chronic kidney disease)    Iron deficiency    Lightheadedness 05/10/2019   PE (pulmonary thromboembolism) ([facility]) 06/2019   Pre-syncope 04/16/2019   Sarcoidosis    Sarcoidosis    Sleep apnea    c pap   Smoker 02/12/2014   Past Surgical History:  Procedure Laterality Date   CHOLECYSTECTOMY N/A 08/25/2016   Procedure: LAPAROSCOPIC CHOLECYSTECTOMY;  Surgeon: Coralie Keens, MD;  Location: [facility];  Service: General;  Laterality: N/A;   TUBAL LIGATION  02/1993     SOCIAL HISTORY:  Social History   Socioeconomic History   Marital status: Married    Spouse name: Not on file   Number of children: 3   Years of education: Not on file   Highest education level: High school graduate  Occupational History   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    1-[name] (girl) 27 at home with her: 2 grandchildren in her home as well    [name]- 47 expecting   [name]-25      Enjoy: read, play games on tablet, grandkids      Diet: Veggies, does not eat a lot of fried foods, enjoys chicken and fruit   Caffeine:  sodas and coffee (with sugar)   Water: Does not drink a lot at all      Wears seat beat   Does not wear sunscreen   Smoke and carbon monoxide detectors   Does not use phone while driving    Social Determinants of Health   Financial Resource Strain: Low Risk  (10/10/2018)   Overall Financial Resource Strain (CARDIA)    Difficulty of Paying Living Expenses: Not hard at all  Food Insecurity: No Food Insecurity (10/10/2018)   Hunger Vital Sign    Worried About Running Out of Food in the Last Year: Never true    [place] in the Last Year: Never true  Transportation Needs: No Transportation Needs (10/10/2018)   PRAPARE - Hydrologist (Medical): No    Lack of Transportation (Non-Medical): No  Physical Activity: Sufficiently Active (10/10/2018)   Exercise Vital Sign    Days of Exercise per Week: 7 days    Minutes of Exercise per Session: 60 min  Stress: No Stress Concern Present (10/10/2018)   [place]    Feeling of Stress : Not at all  Social Connections: Moderately Isolated (10/10/2018)   Social Connection and Isolation Panel [NHANES]    Frequency of Communication with Friends and Family: More than three times a week    Frequency of Social Gatherings with Friends and Family: Once a week    Attends Religious Services: More than 4 times per year    Active Member of [employer] or Organizations: No    Attends Archivist Meetings: Never    Marital Status: Never married  Intimate Partner Violence: Not At Risk (10/10/2018)   Humiliation, Afraid, Rape, and Kick questionnaire    Fear of Current or Ex-Partner: No    Emotionally Abused: No    Physically Abused: No    Sexually Abused: No    FAMILY HISTORY:  Family History  Problem Relation Age of Onset   Hypertension Mother    Cancer Mother    Colon polyps Mother    Healthy Sister    Healthy Sister    Healthy Sister    Healthy Sister     Healthy Son    Healthy Son    Healthy Daughter    Colon cancer Neg Hx    Esophageal cancer Neg Hx    Rectal cancer Neg Hx    Stomach cancer Neg Hx     CURRENT MEDICATIONS:  Outpatient Encounter Medications as of 12/27/2021  Medication Sig Note   acetaminophen (TYLENOL) 500 MG tablet Take 500 mg by mouth every 4 (four) hours as needed for mild pain, moderate pain or headache.     albuterol (VENTOLIN HFA) 108 (90 Base) MCG/ACT inhaler Inhale 2 puffs into the lungs every 6 (six) hours as needed for wheezing or shortness of breath.    azelastine (ASTELIN)  0.1 % nasal spray Place 1 spray into both nostrils at bedtime. Use in each nostril as directed 12/17/2021: As needed   diltiazem (CARDIZEM) 30 MG tablet Take 1 tablet (30 mg total) by mouth 2 (two) times daily. May take an additional 30 mg tablet daily as needed for palpitations    fluticasone (FLONASE) 50 MCG/ACT nasal spray Place 1 spray into both nostrils at bedtime. 12/17/2021: As needed   lisinopril (ZESTRIL) 5 MG tablet Take 1 tablet (5 mg total) by mouth daily.    LORazepam (ATIVAN) 0.5 MG tablet Take 0.5 mg by mouth daily as needed.    nortriptyline (PAMELOR) 10 MG capsule Take 1 capsule (10 mg total) by mouth at bedtime.    pantoprazole (PROTONIX) 40 MG tablet Take 1 tablet (40 mg total) by mouth daily.    prazosin (MINIPRESS) 1 MG capsule 1 mg if needed    rivaroxaban (XARELTO) 20 MG TABS tablet Take 1 tablet (20 mg total) by mouth daily with supper.    Vitamin D, Ergocalciferol, (DRISDOL) 1.25 MG (50000 UNIT) CAPS capsule Take 1 capsule (50,000 Units total) by mouth every 7 (seven) days. (Patient not taking: Reported on 12/17/2021)    zolpidem (AMBIEN) 10 MG tablet Take 10 mg by mouth at bedtime as needed.    No facility-administered encounter medications on file as of 12/27/2021.    ALLERGIES:  No Known Allergies   PHYSICAL EXAM:  ECOG PERFORMANCE STATUS: 1 - Symptomatic but completely ambulatory  There were no vitals filed for  this visit. There were no vitals filed for this visit. Physical Exam Constitutional:      Appearance: Normal appearance. She is obese.  HENT:     Head: Normocephalic and atraumatic.     Mouth/Throat:     Mouth: Mucous membranes are moist.  Eyes:     Extraocular Movements: Extraocular movements intact.     Pupils: Pupils are equal, round, and reactive to light.  Cardiovascular:     Rate and Rhythm: Normal rate and regular rhythm.     Pulses: Normal pulses.     Heart sounds: Normal heart sounds.  Pulmonary:     Effort: Pulmonary effort is normal.     Breath sounds: Normal breath sounds.  Abdominal:     General: Bowel sounds are normal.     Palpations: Abdomen is soft.     Tenderness: There is no abdominal tenderness.  Musculoskeletal:        General: No swelling.     Right lower leg: No edema.     Left lower leg: No edema.  Lymphadenopathy:     Cervical: No cervical adenopathy.  Skin:    General: Skin is warm and dry.  Neurological:     General: No focal deficit present.     Mental Status: She is alert and oriented to person, place, and time.  Psychiatric:        Mood and Affect: Mood normal.        Behavior: Behavior normal.      LABORATORY DATA:  I have reviewed the labs as listed.  CBC    Component Value Date/Time   WBC 8.4 12/20/2021 0934   RBC 4.66 12/20/2021 0934   HGB 12.6 12/20/2021 0934   HGB 12.7 12/28/2020 1126   HCT 40.0 12/20/2021 0934   HCT 38.5 12/28/2020 1126   PLT 278 12/20/2021 0934   PLT 328 12/28/2020 1126   MCV 85.8 12/20/2021 0934   MCV 82 12/28/2020 1126   MCH  27.0 12/20/2021 0934   MCHC 31.5 12/20/2021 0934   RDW 14.0 12/20/2021 0934   RDW 13.1 12/28/2020 1126   LYMPHSABS 2.4 12/20/2021 0934   LYMPHSABS 4.0 (H) 12/28/2020 1126   MONOABS 0.6 12/20/2021 0934   EOSABS 0.2 12/20/2021 0934   EOSABS 0.1 12/28/2020 1126   BASOSABS 0.0 12/20/2021 0934   BASOSABS 0.0 12/28/2020 1126      Latest Ref Rng & Units 11/19/2021   10:35 AM  08/13/2021    6:47 PM 07/03/2021    2:55 AM  CMP  Glucose 70 - 99 mg/dL 106  145  119   BUN 6 - 20 mg/dL 15  12  18    Creatinine 0.44 - 1.00 mg/dL 1.17  1.16  1.41   Sodium 135 - 145 mmol/L 141  133  139   Potassium 3.5 - 5.1 mmol/L 4.3  4.4  3.7   Chloride 98 - 111 mmol/L 105  101  109   CO2 22 - 32 mmol/L 27  21  24    Calcium 8.9 - 10.3 mg/dL 9.3  9.4  9.2   Total Protein 6.5 - 8.1 g/dL 7.3     Total Bilirubin 0.3 - 1.2 mg/dL 0.5     Alkaline Phos 38 - 126 U/L 75     AST 15 - 41 U/L 19     ALT 0 - 44 U/L 15       DIAGNOSTIC IMAGING:  I have independently reviewed the relevant imaging and discussed with the patient.  ASSESSMENT & PLAN: 1.  Unprovoked bilateral pulmonary embolism: - Presentation with right knee pain and swelling and on and off lightheadedness since January 2021.  Presented to the ER on 06/18/2019 with worsening lightheadedness and coughing up blood-streaked sputum. - CT angiogram on 06/18/2019 showed peripheral segmental and subsegmental bilateral pulmonary emboli with no right heart strain.  Bulky mediastinal and hilar lymph nodes and pulmonary findings consistent with history of sarcoidosis. - Bilateral leg Doppler was negative on the same day for DVT. - Lupus anticoagulant was positive x1, negative on repeat testing.  Anticardiolipin antibodies negative.  Factor V Leiden and PT gene negative. - Indefinite anticoagulation was recommended. - She is taking Xarelto without any major bleeding issues.  - Most recent labs (12/20/2021) shows normal D-dimer and CBC. - No current symptoms of recurrent DVT or PE.  - PLAN: Continue indefinite Xarelto, as benefits of anticoagulation continue to outweigh risks at this time.  RTC in 1 year with repeat labs (CBC and D-dimer)  2.  Other history -Sarcoidosis: Diagnosed January 2018, treated with prednisone for 1.5 years.  She is now weaned off of prednisone and use inhaler.  She follows with Dr. Halford Chessman. - Other PMH significant for CKD  (follows with Dr. Theador Hawthorne), positive ANA (follows with Dr. Estanislado Pandy) - She is a former smoker.  Smoked 1 PPD x 22 years, quit 5+ years ago.   All questions were answered. The patient knows to call the clinic with any problems, questions or concerns.  Medical decision making: Low  Time spent on visit: I spent 15 minutes counseling the patient face to face. The total time spent in the appointment was 22 minutes and more than 50% was on counseling.   Harriett Rush, PA-C  12/27/2021 10:32 AM

## 2021-12-27 ENCOUNTER — Inpatient Hospital Stay (HOSPITAL_BASED_OUTPATIENT_CLINIC_OR_DEPARTMENT_OTHER): Payer: 59 | Admitting: Physician Assistant

## 2021-12-27 VITALS — BP 121/86 | HR 69 | Temp 97.6°F | Resp 18 | Ht 65.0 in | Wt 221.5 lb

## 2021-12-27 DIAGNOSIS — I2699 Other pulmonary embolism without acute cor pulmonale: Secondary | ICD-10-CM

## 2021-12-27 DIAGNOSIS — Z7901 Long term (current) use of anticoagulants: Secondary | ICD-10-CM

## 2021-12-27 DIAGNOSIS — Z86711 Personal history of pulmonary embolism: Secondary | ICD-10-CM | POA: Diagnosis not present

## 2021-12-27 NOTE — Patient Instructions (Signed)
[facility] **   You were seen today by Tarri Abernethy PA-C for your history of blood clots.   You did not have any signs of blood clot on exam today. Continue to take your Xarelto daily as prescribed. Let us know if you have any recurrent blood clots or major bleeding episodes before your next appointment.  FOLLOW-UP APPOINTMENT: Same-day labs and office visit in 1 year.  ** Thank you for trusting me with your healthcare!  I strive to provide all of my patients with quality care at each visit.  If you receive a survey for this visit, I would be so grateful to you for taking the time to provide feedback.  Thank you in advance!  ~ Oluwasemilore Pascuzzi                   Dr. Derek Jack   &   Tarri Abernethy, PA-C   - - - - - - - - - - - - - - - - - -    Thank you for choosing [facility] at [facility] to provide your oncology and hematology care.  To afford each patient quality time with our provider, please arrive at least 15 minutes before your scheduled appointment time.   If you have a lab appointment with the [facility] please come in thru the Main Entrance and check in at the main information desk.  You need to re-schedule your appointment should you arrive 10 or more minutes late.  We strive to give you quality time with our providers, and arriving late affects you and other patients whose appointments are after yours.  Also, if you no show three or more times for appointments you may be dismissed from the clinic at the providers discretion.     Again, thank you for choosing [facility].  Our hope is that these requests will decrease the amount of time that you wait before being seen by our physicians.       _____________________________________________________________  Should you have questions after your visit to [facility], please contact our office at  [phone] and follow the prompts.  Our office hours are 8:00 a.m. and 4:30 p.m. Monday - Friday.  Please note that voicemails left after 4:00 p.m. may not be returned until the following business day.  We are closed weekends and major holidays.  You do have access to a nurse 24-7, just call the main number to the clinic [phone] and do not press any options, hold on the line and a nurse will answer the phone.    For prescription refill requests, have your pharmacy contact our office and allow 72 hours.

## 2022-01-11 ENCOUNTER — Encounter: Payer: Self-pay | Admitting: Family Medicine

## 2022-01-11 NOTE — Progress Notes (Signed)
Documentation

## 2022-01-28 ENCOUNTER — Ambulatory Visit (INDEPENDENT_AMBULATORY_CARE_PROVIDER_SITE_OTHER): Payer: 59 | Admitting: Orthopaedic Surgery

## 2022-01-28 ENCOUNTER — Ambulatory Visit (INDEPENDENT_AMBULATORY_CARE_PROVIDER_SITE_OTHER): Payer: 59

## 2022-01-28 DIAGNOSIS — G8929 Other chronic pain: Secondary | ICD-10-CM

## 2022-01-28 DIAGNOSIS — M659 Synovitis and tenosynovitis, unspecified: Secondary | ICD-10-CM | POA: Diagnosis not present

## 2022-01-28 DIAGNOSIS — M25562 Pain in left knee: Secondary | ICD-10-CM | POA: Diagnosis not present

## 2022-01-28 MED ORDER — BUPIVACAINE HCL 0.5 % IJ SOLN
2.0000 mL | INTRAMUSCULAR | Status: AC | PRN
  Administered 2022-01-28: 2 mL via INTRA_ARTICULAR

## 2022-01-28 MED ORDER — METHYLPREDNISOLONE ACETATE 40 MG/ML IJ SUSP
40.0000 mg | INTRAMUSCULAR | Status: AC | PRN
  Administered 2022-01-28: 40 mg via INTRA_ARTICULAR

## 2022-01-28 MED ORDER — LIDOCAINE HCL 1 % IJ SOLN
2.0000 mL | INTRAMUSCULAR | Status: AC | PRN
  Administered 2022-01-28: 2 mL

## 2022-01-28 NOTE — Progress Notes (Signed)
Office Visit Note   Patient: Hailey Ross           Date of Birth: [birth year]           MRN: [id number] Visit Date: 01/28/2022              Requested by: Alvira Monday, [address] [place],  [place] [address] PCP: Alvira Monday, FNP   Assessment & Plan: Visit Diagnoses:  1. Chronic pain of left knee     Plan: Impression is left knee pain and effusion.  I aspirated approximately 29 cc of blood-tinged synovial fluid.  Cortisone was injected.  Patient tolerated this well.  She will follow-up if there is no improvement in her symptoms.  Follow-Up Instructions: No follow-ups on file.   Orders:  Orders Placed This Encounter  Procedures   XR KNEE 3 VIEW LEFT   No orders of the defined types were placed in this encounter.     Procedures: Large Joint Inj: L knee on 01/28/2022 9:41 AM Details: 22 G needle Medications: 2 mL bupivacaine 0.5 %; 2 mL lidocaine 1 %; 40 mg methylPREDNISolone acetate 40 MG/ML Outcome: tolerated well, no immediate complications Patient was prepped and draped in the usual sterile fashion.       Clinical Data: No additional findings.   Subjective: Chief Complaint  Patient presents with   Left Knee - Pain    HPI [name] returns today for recurrent left knee swelling and pain.  She felt something pop in her knee this past Sunday and has been painful and swollen since.  We last saw her about a year ago for left knee pain.  We did a cortisone injection at that time and she felt good relief.  Review of Systems  Constitutional: Negative.   HENT: Negative.    Eyes: Negative.   Respiratory: Negative.    Cardiovascular: Negative.   Endocrine: Negative.   Musculoskeletal: Negative.   Neurological: Negative.   Hematological: Negative.   Psychiatric/Behavioral: Negative.    All other systems reviewed and are negative.    Objective: Vital Signs: There were no vitals taken for this visit.  Physical Exam Vitals and nursing  note reviewed.  Constitutional:      Appearance: She is well-developed.  HENT:     Head: Normocephalic and atraumatic.  Pulmonary:     Effort: Pulmonary effort is normal.  Abdominal:     Palpations: Abdomen is soft.  Musculoskeletal:     Cervical back: Neck supple.     Right knee: Effusion present.     Instability Tests: Medial McMurray test negative and lateral McMurray test negative.  Skin:    General: Skin is warm.     Capillary Refill: Capillary refill takes less than 2 seconds.  Neurological:     Mental Status: She is alert and oriented to person, place, and time.  Psychiatric:        Behavior: Behavior normal.        Thought Content: Thought content normal.        Judgment: Judgment normal.     Right Knee Exam   Muscle Strength  The patient has normal right knee strength.  Tenderness  The patient is experiencing no tenderness.   Range of Motion  Extension:  normal  Flexion:  normal   Tests  McMurray:  Medial - negative Lateral - negative Varus: negative Valgus: negative Lachman:  Anterior - negative    Posterior - negative Drawer:  Anterior -  negative     Patellar apprehension: negative  Other  Erythema: absent Scars: absent Sensation: normal Pulse: present Effusion: effusion present      Specialty Comments:  No specialty comments available.  Imaging: No results found.   PMFS History: Patient Active Problem List   Diagnosis Date Noted   Immunization due 12/17/2021   Need for immunization against influenza 12/17/2021   Fatigue 12/17/2021   Encounter for routine adult physical exam with abnormal findings 08/30/2021   CAP (community acquired pneumonia) 06/16/2021   Acute midline low back pain without sciatica 04/22/2021   Left lower quadrant abdominal pain 04/20/2021   Atypical chest pain 01/26/2021   Synovitis of left knee 01/19/2021   GERD (gastroesophageal reflux disease) 12/29/2020   Abdominal pain, epigastric 12/28/2020   Primary  osteoarthritis of left knee 10/27/2020   Grief at loss of child 06/09/2020   Insomnia 06/09/2020   Right ankle swelling 02/05/2020   Encounter for examination following treatment at hospital 01/28/2020   Palpitations 01/28/2020   Menorrhagia with regular cycle 10/15/2019   OSA (obstructive sleep apnea) 10/08/2019   Right knee pain 08/29/2019   Positive ANA (antinuclear antibody) 08/29/2019   Essential hypertension 07/04/2019   Pain and swelling of right knee 07/04/2019   Hyperlipidemia 07/04/2019   Stage 2 chronic kidney disease 07/04/2019   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   Encounter for support and coordination of transition of care 07/04/2019   Daytime hypersomnolence 07/02/2019   Pulmonary hypertension ([facility]) 07/02/2019   CKD (chronic kidney disease)    Bilateral pulmonary embolism ([facility]) 06/18/2019   Lightheadedness 05/10/2019   Vitamin D deficiency 05/10/2019   Class 2 obesity due to excess calories with body mass index (BMI) of 37.0 to 37.9 in adult 04/16/2019   Sarcoidosis 05/04/2015   Bronchiectasis ([facility]) 05/04/2015   Past Medical History:  Diagnosis Date   Acute cholecystitis 08/24/2016   Acute kidney injury superimposed on chronic kidney disease ([facility]) 06/20/2019   Acute respiratory failure with hypoxia ([facility] [place]) 06/19/2019   Annual visit for general adult medical examination with abnormal findings 04/16/2019   Arthritis    right knee   Bronchiectasis ([facility])    CKD (chronic kidney disease)    Iron deficiency    Lightheadedness 05/10/2019   PE (pulmonary thromboembolism) ([facility]) 06/2019   Pre-syncope 04/16/2019   Sarcoidosis    Sarcoidosis    Sleep apnea    c pap   Smoker 02/12/2014    Family History  Problem Relation Age of Onset   Hypertension Mother    Cancer Mother    Colon polyps Mother    Healthy Sister    Healthy Sister    Healthy Sister    Healthy Sister    Healthy Son    Healthy Son    Healthy Daughter    Colon cancer Neg Hx    Esophageal cancer Neg Hx     Rectal cancer Neg Hx    Stomach cancer Neg Hx     Past Surgical History:  Procedure Laterality Date   CHOLECYSTECTOMY N/A 08/25/2016   Procedure: LAPAROSCOPIC CHOLECYSTECTOMY;  Surgeon: Coralie Keens, MD;  Location: [facility];  Service: General;  Laterality: N/A;   TUBAL LIGATION  02/1993   Social History   Occupational History   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

## 2022-01-29 LAB — CELL COUNT + DIFF, W/O CRYST-SYNVL FLD
Basophils, %: 0 %
Eosinophils-Synovial: 1 % (ref 0–2)
Lymphocytes-Synovial Fld: 39 % (ref 0–74)
Monocyte/Macrophage: 21 % (ref 0–69)
Neutrophil, Synovial: 39 % — ABNORMAL HIGH (ref 0–24)
Synoviocytes, %: 0 % (ref 0–15)
WBC, Synovial: 475 cells/uL — ABNORMAL HIGH (ref <150)

## 2022-01-29 LAB — TIQ-NTM

## 2022-02-21 ENCOUNTER — Ambulatory Visit: Payer: Self-pay | Admitting: Pulmonary Disease

## 2022-02-22 ENCOUNTER — Encounter: Payer: Self-pay | Admitting: Pulmonary Disease

## 2022-02-22 ENCOUNTER — Ambulatory Visit (INDEPENDENT_AMBULATORY_CARE_PROVIDER_SITE_OTHER): Payer: 59 | Admitting: Pulmonary Disease

## 2022-02-22 VITALS — BP 132/68 | HR 62 | Temp 98.2°F | Ht 65.0 in | Wt 221.2 lb

## 2022-02-22 DIAGNOSIS — R058 Other specified cough: Secondary | ICD-10-CM | POA: Diagnosis not present

## 2022-02-22 DIAGNOSIS — G4733 Obstructive sleep apnea (adult) (pediatric): Secondary | ICD-10-CM | POA: Diagnosis not present

## 2022-02-22 DIAGNOSIS — Z862 Personal history of diseases of the blood and blood-forming organs and certain disorders involving the immune mechanism: Secondary | ICD-10-CM | POA: Diagnosis not present

## 2022-02-22 NOTE — Progress Notes (Signed)
[facility]  Chief Complaint  Patient presents with   Follow-up    Having a hard time with cpap wants to discuss inspire     Constitutional:  BP 132/68   Pulse 62   Temp 98.2 F (36.8 C)   Ht _0  (1.651 m)   Wt 221 lb 3.2 oz (100.3 kg)   SpO2 98% Comment: ra  BMI 36.81 kg/m   Past Medical History:  CKD, Anemia, PE March 2021  Past Surgical History:  Her  has a past surgical history that includes Tubal ligation (02/1993) and Cholecystectomy (N/A, 08/25/2016).  Brief Summary:  Hailey Ross is a 51 y.o. female former smoker with pulmonary sarcoidosis, obstructive sleep apnea, and allergic rhinitis.      Subjective:   She wants to know if there are alternative therapies to CPAP.  She does sleep better with CPAP and she doesn't snore.  She doesn't have problem with mask fit or pressure setting.  She has trouble getting winded with any type of physical labor, when bending over, or if she has to carry anything.  Not having cough, wheeze, or sputum.  She is applying for disability.  Physical Exam:   Appearance - well kempt   ENMT - no sinus tenderness, no oral exudate, no LAN, Mallampati 3 airway, no stridor, wears dentures, deviated septum  Respiratory - equal breath sounds bilaterally, no wheezing or rales  CV - s1s2 regular rate and rhythm, no murmurs  Ext - no clubbing, no edema  Skin - no rashes  Psych - normal mood and affect  Pulmonary testing:  Labs 04/21/15 >> HIV negative, Quantiferon gold negative, ACE 195, ANA, RF negative, ESR 32 PFT 05/26/15 >> FEV1 1.67 (66%), FEV1% 88, TLC 2/78 (53%), DLCO 36% PFT 09/26/19 >> FEV1 1.33 (54%), FEV1% 91, TLC 3.17 (61%), DLCO 45%  Chest Imaging:  CT chest 03/18/15 >> biapical thickening with traction BTX, BTX RML and lingula, subcarinal LAN 1.7 cm, patchy increased interstitial markings HRCT chest 03/07/18 >> borderline LAN, patchy GGO, septal thickening, architectural  distortion, cylindrical and varicose BTX CT angio chest 06/18/19 >> peripheral and subsegmental PEs, bulky LAN CT angio chest 07/15/19 >> apical fibrosis, no change in LAN CT angio chest 12/23/20 >> apical fibrosis with honeycombing and traction BTX  Sleep Tests:  HST 07/23/19 >> AHI 9.5, SpO2 low 74% Auto CPAP 07/05/21 to 08/03/21 >> used on 13 of 30 nights with average 3 hrs 45 minutes.  Average AHI 0.1 with median CPAP 8 and 95 th percentile CPAP 12 cm H2O  Cardiac Tests:  Echo 10/19/20 >> EF 55 to 60%, RVSP 35.5 mmHg  Social History:  She  reports that she quit smoking about 6 years ago. Her smoking use included cigarettes. She has a 20.00 pack-year smoking history. She has been exposed to tobacco smoke. She has never used smokeless tobacco. She reports that she does not currently use alcohol. She reports that she does not use drugs.  Family History:  Her family history includes Cancer in her mother; Colon polyps in her mother; Healthy in her daughter, sister, sister, sister, sister, son, and son; Hypertension in her mother.     Assessment/Plan:   Obstructive sleep apnea. - she is compliant with therapy and reports benefit from CPAP - uses [employer] for her DME - continue auto CPAP 5 - 15 cm H2O - encouraged her to use CPAP whenever she is asleep - explained she wouldn't be candidate for  oral appliance due to her dental history - discussed criteria to be a candidate for an Inspire device; her BMI is above 35 and she only has mild sleep apnea  Upper airway cough with post nasal drip with deviated nasal septum. - will have her try nasal irrigation, flonase, and azelastine  Fibrotic pulmonary sarcoidosis. 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 - remains on life long xarelto - followed by Dr. Delton Coombes with Hematology/Oncology  Positive ANA. - followed by Dr.  Bo Merino with rheumatology  CKD 2. - followed by Dr. Theador Hawthorne with nephrology  Time Spent Involved in Patient Care on Day of Examination:  36 minutes  Follow up:   Patient Instructions  Follow up in 6 months  Medication List:   Allergies as of 02/22/2022   No Known Allergies      Medication List        Accurate as of February 22, 2022  9:51 AM. If you have any questions, ask your nurse or doctor.          acetaminophen 500 MG tablet Commonly known as: TYLENOL Take 500 mg by mouth every 4 (four) hours as needed for mild pain, moderate pain or headache.   albuterol 108 (90 Base) MCG/ACT inhaler Commonly known as: VENTOLIN HFA Inhale 2 puffs into the lungs every 6 (six) hours as needed for wheezing or shortness of breath.   azelastine 0.1 % nasal spray Commonly known as: ASTELIN Place 1 spray into both nostrils at bedtime. Use in each nostril as directed   diltiazem 30 MG tablet Commonly known as: CARDIZEM Take 1 tablet (30 mg total) by mouth 2 (two) times daily. May take an additional 30 mg tablet daily as needed for palpitations   fluticasone 50 MCG/ACT nasal spray Commonly known as: FLONASE Place 1 spray into both nostrils at bedtime.   lisinopril 5 MG tablet Commonly known as: ZESTRIL Take 1 tablet (5 mg total) by mouth daily.   LORazepam 0.5 MG tablet Commonly known as: ATIVAN Take 0.5 mg by mouth daily as needed.   nortriptyline 10 MG capsule Commonly known as: PAMELOR Take 1 capsule (10 mg total) by mouth at bedtime.   pantoprazole 40 MG tablet Commonly known as: PROTONIX Take 1 tablet (40 mg total) by mouth daily.   prazosin 1 MG capsule Commonly known as: MINIPRESS 1 mg if needed   rivaroxaban 20 MG Tabs tablet Commonly known as: Xarelto Take 1 tablet (20 mg total) by mouth daily with supper.   Vitamin D (Ergocalciferol) 1.25 MG (50000 UNIT) Caps capsule Commonly known as: DRISDOL Take 1 capsule (50,000 Units total) by mouth  every 7 (seven) days.   zolpidem 10 MG tablet Commonly known as: AMBIEN Take 10 mg by mouth at bedtime as needed.        Signature:  Chesley Mires, MD [place] Pager - [phone] 02/22/2022, 9:51 AM

## 2022-02-22 NOTE — Patient Instructions (Signed)
Follow up in 6 months 

## 2022-03-01 NOTE — Progress Notes (Deleted)
NEUROLOGY FOLLOW UP OFFICE NOTE  Hailey Ross [id number]  Assessment/Plan:   Episodic lightheadedness/dizziness - not positional.  Low suspicion for migraine but we will try treating for possible vestibular migraine Left carpal tunnel syndrome Small fiber polyneuropathy.  Found to have B12 deficiency.       1  ***     Subjective:  Hailey Ross is a 51 year old left-handed female with sarcoidosis, CKD and history of PE who follows up for dizziness and left arm numbness.     UPDATE: To treat possible vestibular migraine, started on nortriptyline 10mg  at bedtime.  ***   B12 and folate levels from 09/29/2021 were 243 and 8.8 respectively.  Advised to restart B12 10/01/2021 daily.   HISTORY: She began having dizzy spells in November 2020.  It is not positional and would come on suddenly.  It is a sensation of feeling "loopy" and would last for a couple of seconds.  Sometimes she may see black spots but no tunnel vision or double vision.  Sometimes she may have palpitations.  No headache, nausea, diaphoresis, focal numbness or weakness.  Frequency varies. It may occur frequently for several days and she may have days to weeks without episodes.  In January 2021, she had a coughing spell that caused her to momentarily loss consciousness for a couple of seconds.  She did not fall but was holding a mug and the next thing she saw it on the floor.  There was some associated palpitations and tachycardia as well.  30 day cardiac event monitor in May 2021 showed no significant arrhythmias.  Echocardiogram in June 2021 showed EF 55-60% with no wall motion abnormalities, mildly elevated PASP of 35.3 mmHg, mildly dilated LA and RA and mild to moderate tricuspid regurgitation.  She saw neurology in November 2021.  MRI of brain with and without contrast on 02/25/2020 personally reviewed showed a partially empty sella but overall unremarkable study.  Carotid ultrasound on 03/02/2020 showed no  hemodynamically significant stenosis.  Etiology believed to be multifactorial, related to dehydration, low blood pressure, menstrual cycle and pulmonary sarcoidosis.  She also had been experiencing atypical chest pain and palpitations.  She has followed up with cardiology.  Repeat echo overall unchanged.  Repeat cardiac event monitor showed 11 runs of SVT, longest lasting 9 beats with maximum rate of 148 but no significant sustained arrhythmias.  Lisinopril was stopped due to low blood pressure, but no change in the dizziness.  No history of migraines.  EEG on 02/15/2021 was normal.  May last few minutes off and on up to 2 weeks.  Maybe mild throbbing headache   She also reports numbness in the left hand and arm.  Mostly notices it laying in bed.  No pain or weakness.  For a few months, she also notes numbness and tingling in her toes.  NCV-EMG on 02/23/2021 showed evidence of mild carpal tunnel syndrome.  Advised to wear wrist splint but she forgot to buy one.  Still feels the numbness at night.  Labs from 01/22/2021 revealed low B12 and folate of 172 and 5.7 respectively.  Advised to start OTC B12 01/24/2021 and folic acid 400mg  daily.  Numbness and tingling in feet improved but returned after discontinuing the B12.  Other labs unremarkable, including TSH 1.17 and normal SPEP/IFE  PAST MEDICAL HISTORY: Past Medical History:  Diagnosis Date   Acute cholecystitis 08/24/2016   Acute kidney injury superimposed on chronic kidney disease (HCC) 06/20/2019   Acute respiratory failure  with hypoxia (HCC) 06/19/2019   Annual visit for general adult medical examination with abnormal findings 04/16/2019   Arthritis    right knee   Bronchiectasis (HCC)    CKD (chronic kidney disease)    Iron deficiency    Lightheadedness 05/10/2019   PE (pulmonary thromboembolism) (HCC) 06/2019   Pre-syncope 04/16/2019   Sarcoidosis    Sarcoidosis    Sleep apnea    c pap   Smoker 02/12/2014    MEDICATIONS: Current Outpatient  Medications on File Prior to Visit  Medication Sig Dispense Refill   acetaminophen (TYLENOL) 500 MG tablet Take 500 mg by mouth every 4 (four) hours as needed for mild pain, moderate pain or headache.      albuterol (VENTOLIN HFA) 108 (90 Base) MCG/ACT inhaler Inhale 2 puffs into the lungs every 6 (six) hours as needed for wheezing or shortness of breath. 8 g 0   azelastine (ASTELIN) 0.1 % nasal spray Place 1 spray into both nostrils at bedtime. Use in each nostril as directed 30 mL 12   diltiazem (CARDIZEM) 30 MG tablet Take 1 tablet (30 mg total) by mouth 2 (two) times daily. May take an additional 30 mg tablet daily as needed for palpitations 270 tablet 3   fluticasone (FLONASE) 50 MCG/ACT nasal spray Place 1 spray into both nostrils at bedtime. 16 g 2   lisinopril (ZESTRIL) 5 MG tablet Take 1 tablet (5 mg total) by mouth daily. 90 tablet 1   LORazepam (ATIVAN) 0.5 MG tablet Take 0.5 mg by mouth daily as needed.     nortriptyline (PAMELOR) 10 MG capsule Take 1 capsule (10 mg total) by mouth at bedtime. 30 capsule 5   pantoprazole (PROTONIX) 40 MG tablet Take 1 tablet (40 mg total) by mouth daily. 90 tablet 3   prazosin (MINIPRESS) 1 MG capsule 1 mg if needed     rivaroxaban (XARELTO) 20 MG TABS tablet Take 1 tablet (20 mg total) by mouth daily with supper. 90 tablet 3   Vitamin D, Ergocalciferol, (DRISDOL) 1.25 MG (50000 UNIT) CAPS capsule Take 1 capsule (50,000 Units total) by mouth every 7 (seven) days. 12 capsule 1   zolpidem (AMBIEN) 10 MG tablet Take 10 mg by mouth at bedtime as needed.     No current facility-administered medications on file prior to visit.    ALLERGIES: No Known Allergies  FAMILY HISTORY: Family History  Problem Relation Age of Onset   Hypertension Mother    Cancer Mother    Colon polyps Mother    Healthy Sister    Healthy Sister    Healthy Sister    Healthy Sister    Healthy Son    Healthy Son    Healthy Daughter    Colon cancer Neg Hx    Esophageal  cancer Neg Hx    Rectal cancer Neg Hx    Stomach cancer Neg Hx       Objective:  *** General: No acute distress.  Patient appears well-groomed.   Head:  Normocephalic/atraumatic Eyes:  Fundi examined but not visualized Neck: supple, no paraspinal tenderness, full range of motion Heart:  Regular rate and rhythm Lungs:  Clear to auscultation bilaterally Back: No paraspinal tenderness Neurological Exam: alert and oriented to person, place, and time.  Speech fluent and not dysarthric, language intact.  CN II-XII intact. Bulk and tone normal, muscle strength 5/5 throughout.  Sensation to light touch intact.  Deep tendon reflexes 2+ throughout, toes downgoing.  Finger to nose testing intact.  Gait normal, Romberg negative.   Shon Millet, DO  CC: Gilmore Laroche, FNP

## 2022-03-02 ENCOUNTER — Encounter: Payer: Self-pay | Admitting: Neurology

## 2022-03-02 ENCOUNTER — Ambulatory Visit: Payer: 59 | Admitting: Neurology

## 2022-03-02 DIAGNOSIS — Z029 Encounter for administrative examinations, unspecified: Secondary | ICD-10-CM

## 2022-03-11 NOTE — Progress Notes (Deleted)
Office Visit Note  Patient: Hailey Ross             Date of Birth: 18-Oct-1970           MRN: [id number]             PCP: Gilmore Laroche, FNP Referring: Donell Beers, FNP Visit Date: 03/25/2022 Occupation: @GUAROCC @  Subjective:  No chief complaint on file.   History of Present Illness: Hailey Ross is a 51 y.o. female ***   Activities of Daily Living:  Patient reports morning stiffness for *** {minute/hour:19697}.   Patient {ACTIONS;DENIES/REPORTS:21021675::"Denies"} nocturnal pain.  Difficulty dressing/grooming: {ACTIONS;DENIES/REPORTS:21021675::"Denies"} Difficulty climbing stairs: {ACTIONS;DENIES/REPORTS:21021675::"Denies"} Difficulty getting out of chair: {ACTIONS;DENIES/REPORTS:21021675::"Denies"} Difficulty using hands for taps, buttons, cutlery, and/or writing: {ACTIONS;DENIES/REPORTS:21021675::"Denies"}  No Rheumatology ROS completed.   PMFS History:  Patient Active Problem List   Diagnosis Date Noted   Immunization due 12/17/2021   Need for immunization against influenza 12/17/2021   Fatigue 12/17/2021   Encounter for routine adult physical exam with abnormal findings 08/30/2021   CAP (community acquired pneumonia) 06/16/2021   Acute midline low back pain without sciatica 04/22/2021   Left lower quadrant abdominal pain 04/20/2021   Atypical chest pain 01/26/2021   Synovitis of left knee 01/19/2021   GERD (gastroesophageal reflux disease) 12/29/2020   Abdominal pain, epigastric 12/28/2020   Primary osteoarthritis of left knee 10/27/2020   Grief at loss of child 06/09/2020   Insomnia 06/09/2020   Right ankle swelling 02/05/2020   Encounter for examination following treatment at hospital 01/28/2020   Palpitations 01/28/2020   Menorrhagia with regular cycle 10/15/2019   OSA (obstructive sleep apnea) 10/08/2019   Right knee pain 08/29/2019   Positive ANA (antinuclear antibody) 08/29/2019   Essential hypertension 07/04/2019   Pain and  swelling of right knee 07/04/2019   Hyperlipidemia 07/04/2019   Stage 2 chronic kidney disease 07/04/2019   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   Encounter for support and coordination of transition of care 07/04/2019   Daytime hypersomnolence 07/02/2019   Pulmonary hypertension (HCC) 07/02/2019   CKD (chronic kidney disease)    Bilateral pulmonary embolism (HCC) 06/18/2019   Lightheadedness 05/10/2019   Vitamin D deficiency 05/10/2019   Class 2 obesity due to excess calories with body mass index (BMI) of 37.0 to 37.9 in adult 04/16/2019   Sarcoidosis 05/04/2015   Bronchiectasis (HCC) 05/04/2015    Past Medical History:  Diagnosis Date   Acute cholecystitis 08/24/2016   Acute kidney injury superimposed on chronic kidney disease (HCC) 06/20/2019   Acute respiratory failure with hypoxia (HCC) 06/19/2019   Annual visit for general adult medical examination with abnormal findings 04/16/2019   Arthritis    right knee   Bronchiectasis (HCC)    CKD (chronic kidney disease)    Iron deficiency    Lightheadedness 05/10/2019   PE (pulmonary thromboembolism) (HCC) 06/2019   Pre-syncope 04/16/2019   Sarcoidosis    Sarcoidosis    Sleep apnea    c pap   Smoker 02/12/2014    Family History  Problem Relation Age of Onset   Hypertension Mother    Cancer Mother    Colon polyps Mother    Healthy Sister    Healthy Sister    Healthy Sister    Healthy Sister    Healthy Son    Healthy Son    Healthy Daughter    Colon cancer Neg Hx    Esophageal cancer Neg Hx    Rectal cancer Neg Hx  Stomach cancer Neg Hx    Past Surgical History:  Procedure Laterality Date   CHOLECYSTECTOMY N/A 08/25/2016   Procedure: LAPAROSCOPIC CHOLECYSTECTOMY;  Surgeon: Abigail Miyamoto, MD;  Location: [facility];  Service: General;  Laterality: N/A;   TUBAL LIGATION  02/1993   Social History   Social History Narrative   Live with partners [name]-19 years   3 children.    1-[name] (girl) 27 at home with her: 2  grandchildren in her home as well    [name]- 26 expecting   [name]-25      Enjoy: read, play games on tablet, grandkids      Diet: Veggies, does not eat a lot of fried foods, enjoys chicken and fruit   Caffeine: sodas and coffee (with sugar)   Water: Does not drink a lot at all      Wears seat beat   Does not wear sunscreen   Smoke and carbon monoxide detectors   Does not use phone while driving    Immunization History  Administered Date(s) Administered   Influenza Inj Mdck Quad Pf 12/25/2019   Influenza,inj,Quad PF,6+ Mos 12/21/2018, 02/22/2021, 12/17/2021   Influenza,inj,Quad PF,6-35 Mos 01/10/2019   PFIZER(Purple Top)SARS-COV-2 Vaccination 08/14/2019, 09/10/2019, 02/07/2020   Pneumococcal Polysaccharide-23 02/12/2014   Tdap 02/12/2014   Zoster Recombinat (Shingrix) 12/17/2021     Objective: Vital Signs: There were no vitals taken for this visit.   Physical Exam   Musculoskeletal Exam: ***  CDAI Exam: CDAI Score: -- Patient Global: --; Provider Global: -- Swollen: --; Tender: -- Joint Exam 03/25/2022   No joint exam has been documented for this visit   There is currently no information documented on the homunculus. Go to the Rheumatology activity and complete the homunculus joint exam.  Investigation: No additional findings.  Imaging: No results found.  Recent Labs: Lab Results  Component Value Date   WBC 8.4 12/20/2021   HGB 12.6 12/20/2021   PLT 278 12/20/2021   NA 141 11/19/2021   K 4.3 11/19/2021   CL 105 11/19/2021   CO2 27 11/19/2021   GLUCOSE 106 (H) 11/19/2021   BUN 15 11/19/2021   CREATININE 1.17 (H) 11/19/2021   BILITOT 0.5 11/19/2021   ALKPHOS 75 11/19/2021   AST 19 11/19/2021   ALT 15 11/19/2021   PROT 7.3 11/19/2021   ALBUMIN 3.8 11/19/2021   CALCIUM 9.3 11/19/2021   GFRAA 62 09/23/2020   QFTBGOLD Negative 05/01/2015   QFTBGOLDPLUS NEGATIVE 11/08/2019    Speciality Comments: No specialty comments available.  Procedures:  No  procedures performed Allergies: Patient has no known allergies.   Assessment / Plan:     Visit Diagnoses: No diagnosis found.  Orders: No orders of the defined types were placed in this encounter.  No orders of the defined types were placed in this encounter.   Face-to-face time spent with patient was *** minutes. Greater than 50% of time was spent in counseling and coordination of care.  Follow-Up Instructions: No follow-ups on file.   Ellen Henri, CMA  Note - This record has been created using Animal nutritionist.  Chart creation errors have been sought, but may not always  have been located. Such creation errors do not reflect on  the standard of medical care.

## 2022-03-21 ENCOUNTER — Ambulatory Visit (INDEPENDENT_AMBULATORY_CARE_PROVIDER_SITE_OTHER): Payer: 59 | Admitting: Adult Health

## 2022-03-21 ENCOUNTER — Encounter: Payer: Self-pay | Admitting: Adult Health

## 2022-03-21 VITALS — BP 110/70 | HR 69 | Temp 98.0°F | Ht 65.0 in | Wt 222.0 lb

## 2022-03-21 DIAGNOSIS — D860 Sarcoidosis of lung: Secondary | ICD-10-CM | POA: Diagnosis not present

## 2022-03-21 DIAGNOSIS — J209 Acute bronchitis, unspecified: Secondary | ICD-10-CM

## 2022-03-21 DIAGNOSIS — I2699 Other pulmonary embolism without acute cor pulmonale: Secondary | ICD-10-CM

## 2022-03-21 MED ORDER — DOXYCYCLINE HYCLATE 100 MG PO TABS: 100.0000 mg | ORAL_TABLET | Freq: Two times a day (BID) | ORAL | 0 refills | Status: DC

## 2022-03-21 MED ORDER — PREDNISONE 10 MG PO TABS: ORAL_TABLET | ORAL | 0 refills | Status: DC

## 2022-03-21 NOTE — Progress Notes (Signed)
_0  ID: Hailey Ross, female    DOB: 1971-02-02, 51 y.o.   MRN: [id number]  Chief Complaint  Patient presents with   Follow-up    doe    Referring provider: Alvira Monday, FNP  HPI: 51 year old female followed for pulmonary sarcoidosis, bronchiectasis and obstructive sleep apnea.  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 Medical history significant for chronic kidney disease and hypertension Raises her grandchildren  TEST/EVENTS :  Labs 04/21/15 >> HIV negative, Quantiferon gold negative, ACE 195, ANA, RF negative, ESR 32     Chest imaging:  CT chest 03/18/15 >> biapical thickening with traction BTX, BTX RML and lingula, subcarinal LAN 1.7 cm, patchy increased interstitial markings   HRCT chest 03/07/18 >> borderline LAN, patchy GGO, septal thickening, architectural distortion, cylindrical and varicose BTX     CT chest June 18, 2019 showed peripheral segmental and subsegmental bilateral pulmonary emboli, bulky mediastinal and hilar lymph nodes, bilateral apical and lingular honeycombing and bilateral upper lobe mild traction bronchiectasis.  Nonspecific groundglass opacities in the left base and right middle lobe.  Not significantly changed   Venous Dopplers were negative for DVT.     2D echo showed a preserved EF.  Mildly reduced right ventricular systolic function and moderately elevated pulmonary artery systolic pressure at 57 mmHg.  Moderate grade 3 protruding plaque involving the distal aortic arch and proximal descending aorta   Echo 09/2019    Home sleep study was done July 22, 2019.  This showed mild sleep apnea with an AHI at 9.5/hour and SPO2 low at 74%   PFT  PFT 05/26/15 >> FEV1 1.67 (66%), FEV1% 88, TLC 2/78 (53%), DLCO 36%   09/26/2019-pulmonary function test-FEV1 54%, ratio 91, FVC 47%, DLCO 45%    03/21/2022 Acute OV : Sarcoid , Cough  Patient presents for an acute office visit.  Patient complains over the last 2  weeks she has had increased cough, congestion, thick mucus.  She has had intermittent wheezing. She denies any fever, chest pain, hemoptysis.  Patient is on ACE inhibitor.  Patient says she has had decreased stamina.  Gets more short of breath with activities. Has a history of unprovoked PE in the past is on lifelong anticoagulation therapy.  Remains on Xarelto.  Endorses compliance  Appetite is good with no nausea vomiting or diarrhea.  She is on CPAP and says that she does not wear it every night.  Forgets to put it on some.  We discussed importance of CPAP compliance.  No Known Allergies  Immunization History  Administered Date(s) Administered   Influenza Inj Mdck Quad Pf 12/25/2019   Influenza,inj,Quad PF,6+ Mos 12/21/2018, 02/22/2021, 12/17/2021   Influenza,inj,Quad PF,6-35 Mos 01/10/2019   PFIZER(Purple Top)SARS-COV-2 Vaccination 08/14/2019, 09/10/2019, 02/07/2020   Pneumococcal Polysaccharide-23 02/12/2014   Tdap 02/12/2014   Zoster Recombinat (Shingrix) 12/17/2021    Past Medical History:  Diagnosis Date   Acute cholecystitis 08/24/2016   Acute kidney injury superimposed on chronic kidney disease ([place]) 06/20/2019   Acute respiratory failure with hypoxia ([place]) 06/19/2019   Annual visit for general adult medical examination with abnormal findings 04/16/2019   Arthritis    right knee   Bronchiectasis ([place])    CKD (chronic kidney disease)    Iron deficiency    Lightheadedness 05/10/2019   PE (pulmonary thromboembolism) ([place]) 06/2019   Pre-syncope 04/16/2019   Sarcoidosis    Sarcoidosis    Sleep apnea    c pap   Smoker 02/12/2014  Tobacco History: Social History   Tobacco Use  Smoking Status Former   Packs/day: 1.00   Years: 20.00   Total pack years: 20.00   Types: Cigarettes   Quit date: 03/21/2015   Years since quitting: 7.0   Passive exposure: Past  Smokeless Tobacco Never   Counseling given: Not Answered   Outpatient Medications Prior to Visit  Medication  Sig Dispense Refill   acetaminophen (TYLENOL) 500 MG tablet Take 500 mg by mouth every 4 (four) hours as needed for mild pain, moderate pain or headache.      albuterol (VENTOLIN HFA) 108 (90 Base) MCG/ACT inhaler Inhale 2 puffs into the lungs every 6 (six) hours as needed for wheezing or shortness of breath. 8 g 0   azelastine (ASTELIN) 0.1 % nasal spray Place 1 spray into both nostrils at bedtime. Use in each nostril as directed 30 mL 12   diltiazem (CARDIZEM) 30 MG tablet Take 1 tablet (30 mg total) by mouth 2 (two) times daily. May take an additional 30 mg tablet daily as needed for palpitations 270 tablet 3   fluticasone (FLONASE) 50 MCG/ACT nasal spray Place 1 spray into both nostrils at bedtime. 16 g 2   lisinopril (ZESTRIL) 5 MG tablet Take 1 tablet (5 mg total) by mouth daily. 90 tablet 1   LORazepam (ATIVAN) 0.5 MG tablet Take 0.5 mg by mouth daily as needed.     nortriptyline (PAMELOR) 10 MG capsule Take 1 capsule (10 mg total) by mouth at bedtime. 30 capsule 5   pantoprazole (PROTONIX) 40 MG tablet Take 1 tablet (40 mg total) by mouth daily. 90 tablet 3   prazosin (MINIPRESS) 1 MG capsule 1 mg if needed     rivaroxaban (XARELTO) 20 MG TABS tablet Take 1 tablet (20 mg total) by mouth daily with supper. 90 tablet 3   Vitamin D, Ergocalciferol, (DRISDOL) 1.25 MG (50000 UNIT) CAPS capsule Take 1 capsule (50,000 Units total) by mouth every 7 (seven) days. 12 capsule 1   zolpidem (AMBIEN) 10 MG tablet Take 10 mg by mouth at bedtime as needed.     No facility-administered medications prior to visit.     Review of Systems:   Constitutional:   No  weight loss, night sweats,  Fevers, chills,  +fatigue, or  lassitude.  HEENT:   No headaches,  Difficulty swallowing,  Tooth/dental problems, or  Sore throat,                No sneezing, itching, ear ache, nasal congestion, post nasal drip,   CV:  No chest pain,  Orthopnea, PND, swelling in lower extremities, anasarca, dizziness, palpitations,  syncope.   GI  No heartburn, indigestion, abdominal pain, nausea, vomiting, diarrhea, change in bowel habits, loss of appetite, bloody stools.   Resp: .  No chest wall deformity  Skin: no rash or lesions.  GU: no dysuria, change in color of urine, no urgency or frequency.  No flank pain, no hematuria   MS:  No joint pain or swelling.  No decreased range of motion.  No back pain.    Physical Exam  BP 110/70 (BP Location: Left Arm, Cuff Size: Normal)   Pulse 69   Temp 98 F (36.7 C) (Oral)   Ht _0  (1.651 m)   Wt 222 lb (100.7 kg)   SpO2 96%   BMI 36.94 kg/m   GEN: A/Ox3; pleasant , NAD, well nourished    HEENT:  /AT, NOSE-clear, THROAT-clear, no lesions, no postnasal  drip or exudate noted.   NECK:  Supple w/ fair ROM; no JVD; normal carotid impulses w/o bruits; no thyromegaly or nodules palpated; no lymphadenopathy.    RESP  Clear  P & A; w/o, wheezes/ rales/ or rhonchi. no accessory muscle use, no dullness to percussion  CARD:  RRR, no m/r/g, no peripheral edema, pulses intact, no cyanosis or clubbing.  GI:   Soft & nt; nml bowel sounds; no organomegaly or masses detected.   Musco: Warm bil, no deformities or joint swelling noted.   Neuro: alert, no focal deficits noted.    Skin: Warm, no lesions or rashes    Lab Results:   BMET   BNP No results found for: "BNP"  ProBNP No results found for: "PROBNP"  Imaging: No results found.      Latest Ref Rng & Units 09/26/2019    2:56 PM 05/26/2015    2:59 PM  PFT Results  FVC-Pre L  1.78   FVC-Predicted Pre % 47  57   FVC-Post L 1.39  1.88   FVC-Predicted Post % 45  60   Pre FEV1/FVC % % 91  85   Post FEV1/FCV % % 85  88   FEV1-Pre L 1.33  1.51   FEV1-Predicted Pre % 54  59   FEV1-Post L 1.18  1.67   DLCO uncorrected ml/min/mmHg 9.96  9.41   DLCO UNC% % 45  36   DLCO corrected ml/min/mmHg 10.18  9.38   DLCO COR %Predicted % 46  36   DLVA Predicted % 105  73   TLC L 3.17  2.78   TLC % Predicted  % 61  53   RV % Predicted % 89  44     No results found for: "NITRICOXIDE"      Assessment & Plan:   Acute bronchitis Acute bronchitis.  Will treat with doxycycline and a prednisone taper.  Plan  Patient Instructions  Doxycycline 148m Twice daily  for 7 days , take with food  Prednisone taper over next week , take with food.  Mucinex DM Twice daily As needed  cough/congestion  Continue on Xarelto 271mdaily.  Avoid NSAID meds , (advil, motrin, etc)  Continue on Flutter valve for cough/congestion .  Albuterol inhaler As needed   Advance activity as tolerated.   Wear CPAP At bedtime  all night long  Saline nasal spray and gel As needed   Wear for at least 4-6 hrs or more each night .  Do not drive if sleepy .   Follow up with Dr. SoHalford Chessmanr Duquan Gillooly NP in 3 months with PFT  Please contact office for sooner follow up if symptoms do not improve or worsen or seek emergency care      [place].  Will check PFTs in 3 months.  Continue on current regimen.  Plan  Patient Instructions  Doxycycline 10029mwice daily  for 7 days , take with food  Prednisone taper over next week , take with food.  Mucinex DM Twice daily As needed  cough/congestion  Continue on Xarelto 43m3mily.  Avoid NSAID meds , (advil, motrin, etc)  Continue on Flutter valve for cough/congestion .  Albuterol inhaler As needed   Advance activity as tolerated.   Wear CPAP At bedtime  all night long  Saline nasal spray and gel As needed   Wear for at least 4-6 hrs or more each night .  Do not drive if  sleepy .   Follow up with Dr. Halford Chessman or Genessis Flanary NP in 3 months with PFT  Please contact office for sooner follow up if symptoms do not improve or worsen or seek emergency care      Bilateral pulmonary embolism ([place]) History of unprovoked PE on lifelong anticoagulation.  Continue on Xarelto.     Rexene Edison, NP 03/21/2022

## 2022-03-21 NOTE — Assessment & Plan Note (Signed)
Acute bronchitis.  Will treat with doxycycline and a prednisone taper.  Plan  Patient Instructions  Doxycycline 100mg  Twice daily  for 7 days , take with food  Prednisone taper over next week , take with food.  Mucinex DM Twice daily As needed  cough/congestion  Continue on Xarelto 20mg  daily.  Avoid NSAID meds , (advil, motrin, etc)  Continue on Flutter valve for cough/congestion .  Albuterol inhaler As needed   Advance activity as tolerated.   Wear CPAP At bedtime  all night long  Saline nasal spray and gel As needed   Wear for at least 4-6 hrs or more each night .  Do not drive if sleepy .   Follow up with Dr. or Dannie Hattabaugh NP in 3 months with PFT  Please contact office for sooner follow up if symptoms do not improve or worsen or seek emergency care

## 2022-03-21 NOTE — Assessment & Plan Note (Signed)
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  Continue on Xarelto.

## 2022-03-21 NOTE — Patient Instructions (Signed)
Doxycycline 100mg  Twice daily  for 7 days , take with food  Prednisone taper over next week , take with food.  Mucinex DM Twice daily As needed  cough/congestion  Continue on Xarelto 20mg  daily.  Avoid NSAID meds , (advil, motrin, etc)  Continue on Flutter valve for cough/congestion .  Albuterol inhaler As needed   Advance activity as tolerated.   Wear CPAP At bedtime  all night long  Saline nasal spray and gel As needed   Wear for at least 4-6 hrs or more each night .  Do not drive if sleepy .   Follow up with Dr. or Briea Mcenery NP in 3 months with PFT  Please contact office for sooner follow up if symptoms do not improve or worsen or seek emergency care

## 2022-03-21 NOTE — Assessment & Plan Note (Signed)
Mild flare with bronchitis.  Will check PFTs in 3 months.  Continue on current regimen.  Plan  Patient Instructions  Doxycycline 100mg  Twice daily  for 7 days , take with food  Prednisone taper over next week , take with food.  Mucinex DM Twice daily As needed  cough/congestion  Continue on Xarelto 20mg  daily.  Avoid NSAID meds , (advil, motrin, etc)  Continue on Flutter valve for cough/congestion .  Albuterol inhaler As needed   Advance activity as tolerated.   Wear CPAP At bedtime  all night long  Saline nasal spray and gel As needed   Wear for at least 4-6 hrs or more each night .  Do not drive if sleepy .   Follow up with Dr. or [place] Sorn NP in 3 months with PFT  Please contact office for sooner follow up if symptoms do not improve or worsen or seek emergency care

## 2022-03-21 NOTE — Progress Notes (Signed)
Reviewed and agree with assessment/plan.   Coralyn Helling, MD [facility] Pulmonary/Critical Care 03/21/2022, 6:05 PM Pager:  [phone]

## 2022-03-25 ENCOUNTER — Ambulatory Visit: Payer: 59 | Attending: Rheumatology | Admitting: Rheumatology

## 2022-03-25 DIAGNOSIS — M7062 Trochanteric bursitis, left hip: Secondary | ICD-10-CM

## 2022-03-25 DIAGNOSIS — N1831 Chronic kidney disease, stage 3a: Secondary | ICD-10-CM

## 2022-03-25 DIAGNOSIS — G8929 Other chronic pain: Secondary | ICD-10-CM

## 2022-03-25 DIAGNOSIS — G4733 Obstructive sleep apnea (adult) (pediatric): Secondary | ICD-10-CM

## 2022-03-25 DIAGNOSIS — R768 Other specified abnormal immunological findings in serum: Secondary | ICD-10-CM

## 2022-03-25 DIAGNOSIS — E782 Mixed hyperlipidemia: Secondary | ICD-10-CM

## 2022-03-25 DIAGNOSIS — D869 Sarcoidosis, unspecified: Secondary | ICD-10-CM

## 2022-03-25 DIAGNOSIS — N2581 Secondary hyperparathyroidism of renal origin: Secondary | ICD-10-CM

## 2022-03-25 DIAGNOSIS — G4719 Other hypersomnia: Secondary | ICD-10-CM

## 2022-03-25 DIAGNOSIS — I10 Essential (primary) hypertension: Secondary | ICD-10-CM

## 2022-03-25 DIAGNOSIS — M19042 Primary osteoarthritis, left hand: Secondary | ICD-10-CM

## 2022-03-25 DIAGNOSIS — M25461 Effusion, right knee: Secondary | ICD-10-CM

## 2022-03-25 DIAGNOSIS — I272 Pulmonary hypertension, unspecified: Secondary | ICD-10-CM

## 2022-03-25 DIAGNOSIS — E559 Vitamin D deficiency, unspecified: Secondary | ICD-10-CM

## 2022-03-25 DIAGNOSIS — J479 Bronchiectasis, uncomplicated: Secondary | ICD-10-CM

## 2022-03-25 DIAGNOSIS — I2699 Other pulmonary embolism without acute cor pulmonale: Secondary | ICD-10-CM

## 2022-04-08 ENCOUNTER — Telehealth: Payer: Self-pay | Admitting: Pulmonary Disease

## 2022-04-08 NOTE — Telephone Encounter (Signed)
Dr. Craige Cotta completed the Medical Source Statement (Pulmonary) that was sent to him by [employer].  I have faxed the signed form to [phone], Attn:  Pearl Joyner-Lee.   I also emailed Spero Geralds to add $29 processing fee to the patient's chart.

## 2022-04-14 ENCOUNTER — Other Ambulatory Visit: Payer: Self-pay | Admitting: Adult Health

## 2022-04-14 DIAGNOSIS — R109 Unspecified abdominal pain: Secondary | ICD-10-CM

## 2022-04-14 DIAGNOSIS — K219 Gastro-esophageal reflux disease without esophagitis: Secondary | ICD-10-CM

## 2022-04-29 ENCOUNTER — Encounter: Payer: Self-pay | Admitting: Gastroenterology

## 2022-04-29 ENCOUNTER — Ambulatory Visit (INDEPENDENT_AMBULATORY_CARE_PROVIDER_SITE_OTHER): Payer: 59 | Admitting: Gastroenterology

## 2022-04-29 VITALS — BP 128/72 | HR 71 | Ht 65.0 in | Wt 224.0 lb

## 2022-04-29 DIAGNOSIS — R6881 Early satiety: Secondary | ICD-10-CM | POA: Diagnosis not present

## 2022-04-29 DIAGNOSIS — R1010 Upper abdominal pain, unspecified: Secondary | ICD-10-CM

## 2022-04-29 MED ORDER — DICYCLOMINE HCL 20 MG PO TABS: 20.0000 mg | ORAL_TABLET | Freq: Two times a day (BID) | ORAL | 2 refills | Status: DC

## 2022-04-29 NOTE — Patient Instructions (Signed)
We have sent the following medications to your pharmacy for you to pick up at your convenience: dicyclomine 20 mg twice daily.  Start over the counter Benefiber  2 teaspoons in 8 ounces daily.   You have been scheduled for a gastric emptying scan at [facility] on 05/18/22 at 7:30am. Please arrive at least 30 minutes prior to your appointment for registration. Please make certain not to have anything to eat or drink after midnight the night before your test. Hold all stomach medications (ex: Zofran, phenergan, Reglan, pantoprazole) 24 hours prior to your test. If you need to reschedule your appointment, please contact radiology scheduling at [phone]. _____________________________________________________________________ A gastric-emptying study measures how long it takes for food to move through your stomach. There are several ways to measure stomach emptying. In the most common test, you eat food that contains a small amount of radioactive material. A scanner that detects the movement of the radioactive material is placed over your abdomen to monitor the rate at which food leaves your stomach. This test normally takes about 4 hours to complete. _____________________________________________________________________  The [place] [facility] providers would like to encourage you to use [facility] to communicate with providers for non-urgent requests or questions.  Due to long hold times on the telephone, sending your provider a message by [facility] may be a faster and more efficient way to get a response.  Please allow 48 business hours for a response.  Please remember that this is for non-urgent requests.   Due to recent changes in healthcare laws, you may see the results of your imaging and laboratory studies on MyChart before your provider has had a chance to review them.  We understand that in some cases there may be results that are confusing or concerning to you. Not all laboratory results come back in  the same time frame and the provider may be waiting for multiple results in order to interpret others.  Please give [place] 48 hours in order for your provider to thoroughly review all the results before contacting the office for clarification of your results.

## 2022-04-29 NOTE — Progress Notes (Signed)
04/29/2022 Hailey Ross [id number] [birth year]   HISTORY OF PRESENT ILLNESS: This is a 52 year old female who is a patient of Dr. Blanch Media.  She was last seen here by him in February 2023 at which time she was apparently doing well.  Prior to that she had seen me for complaints of abdominal pain.  CT scan abdomen/pelvis with contrast in January 2023 was unrevealing for cause of her pain.  She is here today again with complaints of abdominal pain.  She describes a tightness or a sensation of a knot or cramping in her mid to upper abdomen.  She says it feels like it pulls across the top of her abdomen.  She describes feeling full after just a few bites of food and just feels like her food just sits in her upper abdomen.  While she is here she also describes that sometimes after she has a bowel movement she will clean herself well and then later on when she goes to the bathroom and she will have stool material again in her perianal area.  EGD 09/2019:   1. Nonspecific gastric erythema. 2. GERD   Negative Hpylori.  Colonoscopy 09/2019 was normal.   Past Medical History:  Diagnosis Date   Acute cholecystitis 08/24/2016   Acute kidney injury superimposed on chronic kidney disease ([place]) 06/20/2019   Acute respiratory failure with hypoxia ([place]) 06/19/2019   Annual visit for general adult medical examination with abnormal findings 04/16/2019   Arthritis    right knee   Bronchiectasis ([place])    CKD (chronic kidney disease)    Iron deficiency    Lightheadedness 05/10/2019   PE (pulmonary thromboembolism) ([place]) 06/2019   Pre-syncope 04/16/2019   Sarcoidosis    Sarcoidosis    Sleep apnea    c pap   Smoker 02/12/2014   Past Surgical History:  Procedure Laterality Date   CHOLECYSTECTOMY N/A 08/25/2016   Procedure: LAPAROSCOPIC CHOLECYSTECTOMY;  Surgeon: Coralie Keens, MD;  Location: [place];  Service: General;  Laterality: N/A;   TUBAL LIGATION  02/1993    reports that she quit smoking  about 7 years ago. Her smoking use included cigarettes. She has a 20.00 pack-year smoking history. She has been exposed to tobacco smoke. She has never used smokeless tobacco. She reports that she does not currently use alcohol. She reports that she does not use drugs. family history includes Cancer in her mother; Colon polyps in her mother; Healthy in her daughter, sister, sister, sister, sister, son, and son; Hypertension in her mother. No Known Allergies    Outpatient Encounter Medications as of 04/29/2022  Medication Sig   acetaminophen (TYLENOL) 500 MG tablet Take 500 mg by mouth every 4 (four) hours as needed for mild pain, moderate pain or headache.    albuterol (VENTOLIN HFA) 108 (90 Base) MCG/ACT inhaler Inhale 2 puffs into the lungs every 6 (six) hours as needed for wheezing or shortness of breath.   azelastine (ASTELIN) 0.1 % nasal spray Place 1 spray into both nostrils at bedtime. Use in each nostril as directed   diltiazem (CARDIZEM) 30 MG tablet Take 1 tablet (30 mg total) by mouth 2 (two) times daily. May take an additional 30 mg tablet daily as needed for palpitations   doxycycline (VIBRA-TABS) 100 MG tablet Take 1 tablet (100 mg total) by mouth 2 (two) times daily.   lisinopril (ZESTRIL) 5 MG tablet Take 1 tablet (5 mg total) by mouth daily.   LORazepam (ATIVAN) 0.5 MG tablet Take  0.5 mg by mouth daily as needed.   nortriptyline (PAMELOR) 10 MG capsule Take 1 capsule (10 mg total) by mouth at bedtime.   pantoprazole (PROTONIX) 40 MG tablet TAKE 1 TABLET(40 MG) BY MOUTH DAILY   prazosin (MINIPRESS) 1 MG capsule 1 mg if needed   rivaroxaban (XARELTO) 20 MG TABS tablet Take 1 tablet (20 mg total) by mouth daily with supper.   zolpidem (AMBIEN) 10 MG tablet Take 10 mg by mouth at bedtime as needed.   fluticasone (FLONASE) 50 MCG/ACT nasal spray Place 1 spray into both nostrils at bedtime. (Patient not taking: Reported on 04/29/2022)   predniSONE (DELTASONE) 10 MG tablet 4 tabs for 2  days, then 3 tabs for 2 days, 2 tabs for 2 days, then 1 tab for 2 days, then stop (Patient not taking: Reported on 04/29/2022)   Vitamin D, Ergocalciferol, (DRISDOL) 1.25 MG (50000 UNIT) CAPS capsule Take 1 capsule (50,000 Units total) by mouth every 7 (seven) days. (Patient not taking: Reported on 04/29/2022)   No facility-administered encounter medications on file as of 04/29/2022.    REVIEW OF SYSTEMS  : All other systems reviewed and negative except where noted in the History of Present Illness.   PHYSICAL EXAM: BP 128/72   Pulse 71   Ht 5\' 5"  (1.651 m)   Wt 224 lb (101.6 kg)   BMI 37.28 kg/m  General: Well developed female in no acute distress Head: Normocephalic and atraumatic Eyes:  Sclerae anicteric, conjunctiva pink. Ears: Normal auditory acuity Lungs: Clear throughout to auscultation; no W/R/R. Heart: Regular rate and rhythm; no M/R/G. Abdomen: Soft, non-distended,  BS present.  Non-tender.. Rectal:  No external abnormalities noted.  DRE did not reveal any masses. Musculoskeletal: Symmetrical with no gross deformities  Skin: No lesions on visible extremities Extremities: No edema  Neurological: Alert oriented x 4, grossly non-focal Psychological:  Alert and cooperative. Normal mood and affect  ASSESSMENT AND PLAN: *Upper to mid abdominal pain and early satiety: She is not diabetic, but describes food feeling like it is just sitting in her stomach.  Describes feeling full very quickly after just a few bites of food.  Will check gastric emptying scan.  Will try some dicyclomine 20 mg twice daily in the interim for abdominal cramping.  Prescription to pharmacy.  Pending results of gastric emptying scan her response to the dicyclomine could consider trying to have her restart her nortriptyline every night and increase the dose of that or since she does not really use that even try amitriptyline or mirtazapine instead.  Will follow-up in 4-6 weeks. *Stool leakage: Describes that  after she has a bowel movement and cleans herself that she will go to the bathroom later on and will have stool in the perianal area.  Maybe she is not completely emptying the rectum.  Will have her start Benefiber 2 teaspoons mixed in 8 ounces of liquid daily to help with some bulk.   CC:  Alvira Monday, FNP

## 2022-05-01 NOTE — Progress Notes (Signed)
Noted  

## 2022-05-17 ENCOUNTER — Ambulatory Visit
Admission: RE | Admit: 2022-05-17 | Discharge: 2022-05-17 | Disposition: A | Discharge status: Home / Self Care | Payer: 59 | Source: Ambulatory Visit | Attending: Nurse Practitioner | Admitting: Nurse Practitioner

## 2022-05-17 ENCOUNTER — Ambulatory Visit (INDEPENDENT_AMBULATORY_CARE_PROVIDER_SITE_OTHER): Payer: 59

## 2022-05-17 VITALS — BP 139/84 | HR 78 | Temp 98.2°F | Resp 20

## 2022-05-17 DIAGNOSIS — R059 Cough, unspecified: Secondary | ICD-10-CM

## 2022-05-17 DIAGNOSIS — R051 Acute cough: Secondary | ICD-10-CM | POA: Diagnosis not present

## 2022-05-17 MED ORDER — BENZONATATE 100 MG PO CAPS: 100.0000 mg | ORAL_CAPSULE | Freq: Three times a day (TID) | ORAL | 0 refills | Status: DC | PRN

## 2022-05-17 NOTE — ED Triage Notes (Signed)
Pt reports coughing up flem with blood and slight headache since this morning.

## 2022-05-17 NOTE — ED Provider Notes (Signed)
[facility]    CSN: [id number] Arrival date & time: 05/17/22  1242      History   Chief Complaint Chief Complaint  Patient presents with   Cough    HPI Hailey Ross is a 52 y.o. female.   Patient presents today for 2-day history of productive cough, cold chills, some chest tightness when breathing, nasal congestion and runny nose, irritated throat, slight headache, and fatigue.  She denies shortness of breath or chest pain, chest congestion, abdominal pain, nausea/vomiting, diarrhea, and decreased appetite.  No known sick contacts.  Reports she became concerned this morning because she started coughing up strands of blood this morning and specks of blood on her way to urgent care today.  Reports she took NyQuil last night for her symptoms.  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  Medical history significant for pulmonary emboli, pulmonary sarcoidosis.  Reports compliance with Xarelto and follows with pulmonology regularly.  Next appointment is next month.    Past Medical History:  Diagnosis Date   Acute cholecystitis 08/24/2016   Acute kidney injury superimposed on chronic kidney disease (HCC) 06/20/2019   Acute respiratory failure with hypoxia (HCC) 06/19/2019   Annual visit for general adult medical examination with abnormal findings 04/16/2019   Arthritis    right knee   Bronchiectasis (HCC)    CKD (chronic kidney disease)    Iron deficiency    Lightheadedness 05/10/2019   PE (pulmonary thromboembolism) (HCC) 06/2019   Pre-syncope 04/16/2019   Sarcoidosis    Sarcoidosis    Sleep apnea    c pap   Smoker 02/12/2014    Patient Active Problem List   Diagnosis Date Noted   Upper abdominal pain 04/29/2022   Early satiety 04/29/2022   Acute bronchitis 03/21/2022   Immunization due 12/17/2021   Need  for immunization against influenza 12/17/2021   Fatigue 12/17/2021   Encounter for routine adult physical exam with abnormal findings 08/30/2021   CAP (community acquired pneumonia) 06/16/2021   Acute midline low back pain without sciatica 04/22/2021   Left lower quadrant abdominal pain 04/20/2021   Atypical chest pain 01/26/2021   Synovitis of left knee 01/19/2021   GERD (gastroesophageal reflux disease) 12/29/2020   Abdominal pain, epigastric 12/28/2020   Primary osteoarthritis of left knee 10/27/2020   Grief at loss of child 06/09/2020   Insomnia 06/09/2020   Right ankle swelling 02/05/2020   Encounter for examination following treatment at hospital 01/28/2020   Palpitations 01/28/2020   Menorrhagia with regular cycle 10/15/2019   OSA (obstructive sleep apnea) 10/08/2019   Right knee pain 08/29/2019   Positive ANA (antinuclear antibody) 08/29/2019   Essential hypertension 07/04/2019   Pain and swelling of right knee 07/04/2019   Hyperlipidemia 07/04/2019   Stage 2 chronic kidney disease 07/04/2019   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   Encounter for support and coordination of transition of care 07/04/2019   Daytime hypersomnolence 07/02/2019   Pulmonary hypertension (HCC) 07/02/2019   CKD (chronic kidney disease)    Bilateral pulmonary embolism (HCC) 06/18/2019   Lightheadedness 05/10/2019   Vitamin D deficiency 05/10/2019   Class 2 obesity due to excess calories with body mass index (BMI) of 37.0 to 37.9 in adult 04/16/2019   Sarcoidosis 05/04/2015   Bronchiectasis (HCC) 05/04/2015    Past Surgical History:  Procedure Laterality Date  CHOLECYSTECTOMY N/A 08/25/2016   Procedure: LAPAROSCOPIC CHOLECYSTECTOMY;  Surgeon: Coralie Keens, MD;  Location: [place];  Service: General;  Laterality: N/A;   TUBAL LIGATION  02/1993    OB History     Gravida  3   Para  3   Term  3   Preterm      AB      Living  3      SAB      IAB      Ectopic      Multiple       Live Births               Home Medications    Prior to Admission medications   Medication Sig Start Date End Date Taking? Authorizing Provider  benzonatate (TESSALON) 100 MG capsule Take 1 capsule (100 mg total) by mouth 3 (three) times daily as needed for cough. Do not take with alcohol or while driving or operating heavy machinery.  May cause drowsiness. 05/17/22  Yes Eulogio Bear, NP  acetaminophen (TYLENOL) 500 MG tablet Take 500 mg by mouth every 4 (four) hours as needed for mild pain, moderate pain or headache.     [provider]  albuterol (VENTOLIN HFA) 108 (90 Base) MCG/ACT inhaler Inhale 2 puffs into the lungs every 6 (six) hours as needed for wheezing or shortness of breath. 08/11/21   Leath-Warren, Alda Lea, NP  azelastine (ASTELIN) 0.1 % nasal spray Place 1 spray into both nostrils at bedtime. Use in each nostril as directed 08/04/21   Chesley Mires, MD  diltiazem (CARDIZEM) 30 MG tablet Take 1 tablet (30 mg total) by mouth 2 (two) times daily. May take an additional 30 mg tablet daily as needed for palpitations 11/30/21   Arnoldo Lenis, MD  fluticasone [facility]) 50 MCG/ACT nasal spray Place 1 spray into both nostrils at bedtime. Patient not taking: Reported on 04/29/2022 08/04/21   Chesley Mires, MD  lisinopril (ZESTRIL) 5 MG tablet Take 1 tablet (5 mg total) by mouth daily. 12/17/21   Paseda, Dewaine Conger, FNP  LORazepam (ATIVAN) 0.5 MG tablet Take 0.5 mg by mouth daily as needed. 01/21/21   [provider]  pantoprazole (PROTONIX) 40 MG tablet TAKE 1 TABLET(40 MG) BY MOUTH DAILY 04/14/22   Parrett, Fonnie Mu, NP  prazosin (MINIPRESS) 1 MG capsule 1 mg if needed 07/27/20   [provider]  rivaroxaban (XARELTO) 20 MG TABS tablet Take 1 tablet (20 mg total) by mouth daily with supper. 06/16/21   Parrett, Fonnie Mu, NP  zolpidem (AMBIEN) 10 MG tablet Take 10 mg by mouth at bedtime as needed. 03/18/21   [provider]    Family  History Family History  Problem Relation Age of Onset   Hypertension Mother    Cancer Mother    Colon polyps Mother    Healthy Sister    Healthy Sister    Healthy Sister    Healthy Sister    Healthy Son    Healthy Son    Healthy Daughter    Colon cancer Neg Hx    Esophageal cancer Neg Hx    Rectal cancer Neg Hx    Stomach cancer Neg Hx     Social History Social History   Tobacco Use   Smoking status: Former    Packs/day: 1.00    Years: 20.00    Total pack years: 20.00    Types: Cigarettes    Quit date: 03/21/2015    Years  since quitting: 7.1    Passive exposure: Past   Smokeless tobacco: Never  Vaping Use   Vaping Use: Never used  Substance Use Topics   Alcohol use: Not Currently    Comment: Rare   Drug use: Never     Allergies   Patient has no known allergies.   Review of Systems Review of Systems Per HPI  Physical Exam Triage Vital Signs ED Triage Vitals  Enc Vitals Group     BP 05/17/22 1307 139/84     Pulse Rate 05/17/22 1307 78     Resp 05/17/22 1307 20     Temp 05/17/22 1307 98.2 F (36.8 C)     Temp Source 05/17/22 1307 Oral     SpO2 05/17/22 1307 93 %     Weight --      Height --      Head Circumference --      Peak Flow --      Pain Score 05/17/22 1315 0     Pain Loc --      Pain Edu? --      Excl. in [place]? --    No data found.  Updated Vital Signs BP 139/84 (BP Location: Right Arm)   Pulse 78   Temp 98.2 F (36.8 C) (Oral)   Resp 20   SpO2 93%   Visual Acuity Right Eye Distance:   Left Eye Distance:   Bilateral Distance:    Right Eye Near:   Left Eye Near:    Bilateral Near:     Physical Exam Vitals and nursing note reviewed.  Constitutional:      General: She is not in acute distress.    Appearance: Normal appearance. She is not ill-appearing or toxic-appearing.  HENT:     Head: Normocephalic and atraumatic.     Right Ear: Tympanic membrane, ear canal and external ear normal.     Left Ear: Tympanic membrane, ear  canal and external ear normal.     Nose: No congestion or rhinorrhea.     Mouth/Throat:     Mouth: Mucous membranes are moist.     Pharynx: Oropharynx is clear. No oropharyngeal exudate or posterior oropharyngeal erythema.  Eyes:     General: No scleral icterus.    Extraocular Movements: Extraocular movements intact.  Cardiovascular:     Rate and Rhythm: Normal rate and regular rhythm.  Pulmonary:     Effort: Pulmonary effort is normal. No respiratory distress.     Breath sounds: Normal breath sounds. No wheezing, rhonchi or rales.  Abdominal:     General: Abdomen is flat. Bowel sounds are normal. There is no distension.     Palpations: Abdomen is soft.     Tenderness: There is no abdominal tenderness.  Musculoskeletal:     Cervical back: Normal range of motion and neck supple.  Lymphadenopathy:     Cervical: No cervical adenopathy.  Skin:    General: Skin is warm and dry.     Coloration: Skin is not jaundiced or pale.     Findings: No erythema or rash.  Neurological:     Mental Status: She is alert and oriented to person, place, and time.  Psychiatric:        Behavior: Behavior is cooperative.      UC Treatments / Results  Labs (all labs ordered are listed, but only abnormal results are displayed) Labs Reviewed - No data to display  EKG   Radiology DG Chest 2 View  Result  Date: 05/17/2022 CLINICAL DATA:  Cough.  History of sarcoidosis EXAM: CHEST - 2 VIEW COMPARISON:  11/19/2021 FINDINGS: Midline trachea. Patient rotated left. Normal heart size. Soft tissue fullness within both hila suspicious for adenopathy, similar. No pleural effusion or pneumothorax. Upper lobe predominant interstitial thickening with architectural distortion and hilar retraction. Biapical pleuroparenchymal scarring. No convincing evidence of acute superimposed lobar consolidation. IMPRESSION: Relatively similar appearance of the chest since 11/19/2021 plain film, with nodal and pleuroparenchymal  findings likely related to the clinical history of sarcoidosis. No convincing evidence of acute superimposed process. Electronically Signed   By: Abigail Miyamoto M.D.   On: 05/17/2022 13:47    Procedures Procedures (including critical care time)  Medications Ordered in UC Medications - No data to display  Initial Impression / Assessment and Plan / UC Course  I have reviewed the triage vital signs and the nursing notes.  Pertinent labs & imaging results that were available during my care of the patient were reviewed by me and considered in my medical decision making (see chart for details).   Patient is well-appearing, normotensive, afebrile, not tachycardic, not tachypneic, oxygenating well on room air.    1. Acute cough Unclear if etiology; possibly viral, could also be secondary to pulmonary sarcoidosis in which patient reports she has a chronic cough Viral testing deferred today Examination and vital signs today are reassuring Chest x-ray today does not show any acute cardiopulmonary process Recommended use of Mucinex to help thin congestion, Tessalon Perles to suppress cough and prevent trauma from coughing which is likely causing the blood in her mucus Strict ER precautions discussed with patient  The patient was given the opportunity to ask questions.  All questions answered to their satisfaction.  The patient is in agreement to this plan.    Final Clinical Impressions(s) / UC Diagnoses   Final diagnoses:  Acute cough     Discharge Instructions      The chest x-ray today does not show any new acute findings of pneumonia or consolidation in your lungs.  I suspect the blood in your mucus is coming from coughing.  You can take the [facility] as needed for the cough.  If you start coughing up only blood or start having shortness of breath or chest pain, please go to the emergency room.     ED Prescriptions     Medication Sig Dispense Auth. Provider   benzonatate  (TESSALON) 100 MG capsule Take 1 capsule (100 mg total) by mouth 3 (three) times daily as needed for cough. Do not take with alcohol or while driving or operating heavy machinery.  May cause drowsiness. 21 capsule Eulogio Bear, NP      PDMP not reviewed this encounter.   Eulogio Bear, NP 05/17/22 1515

## 2022-05-17 NOTE — Discharge Instructions (Addendum)
The chest x-ray today does not show any new acute findings of pneumonia or consolidation in your lungs.  I suspect the blood in your mucus is coming from coughing.  You can take the [facility] as needed for the cough.  If you start coughing up only blood or start having shortness of breath or chest pain, please go to the emergency room.

## 2022-05-18 ENCOUNTER — Ambulatory Visit (HOSPITAL_COMMUNITY)
Admission: RE | Admit: 2022-05-18 | Discharge: 2022-05-18 | Disposition: A | Discharge status: Home / Self Care | Payer: 59 | Source: Ambulatory Visit | Attending: Gastroenterology | Admitting: Gastroenterology

## 2022-05-18 ENCOUNTER — Other Ambulatory Visit: Payer: Self-pay

## 2022-05-18 DIAGNOSIS — R6881 Early satiety: Secondary | ICD-10-CM | POA: Insufficient documentation

## 2022-05-18 DIAGNOSIS — R1010 Upper abdominal pain, unspecified: Secondary | ICD-10-CM | POA: Diagnosis present

## 2022-05-18 DIAGNOSIS — Z0289 Encounter for other administrative examinations: Secondary | ICD-10-CM

## 2022-05-18 MED ORDER — METOCLOPRAMIDE HCL 5 MG PO TABS: 5.0000 mg | ORAL_TABLET | Freq: Four times a day (QID) | ORAL | 0 refills | Status: DC

## 2022-05-18 MED ORDER — TECHNETIUM TC 99M SULFUR COLLOID
2.2000 | Freq: Once | INTRAVENOUS | Status: AC
  Administered 2022-05-18: 2.2 via ORAL

## 2022-06-09 ENCOUNTER — Encounter: Payer: Self-pay | Admitting: Radiology

## 2022-06-10 ENCOUNTER — Encounter: Payer: Self-pay | Admitting: Gastroenterology

## 2022-06-10 ENCOUNTER — Ambulatory Visit (INDEPENDENT_AMBULATORY_CARE_PROVIDER_SITE_OTHER): Payer: 59 | Admitting: Gastroenterology

## 2022-06-10 VITALS — BP 120/86 | HR 68 | Ht 65.0 in | Wt 226.5 lb

## 2022-06-10 DIAGNOSIS — R6881 Early satiety: Secondary | ICD-10-CM | POA: Diagnosis not present

## 2022-06-10 DIAGNOSIS — R1010 Upper abdominal pain, unspecified: Secondary | ICD-10-CM | POA: Diagnosis not present

## 2022-06-10 MED ORDER — METOCLOPRAMIDE HCL 5 MG PO TABS: 5.0000 mg | ORAL_TABLET | Freq: Three times a day (TID) | ORAL | 1 refills | Status: DC

## 2022-06-10 NOTE — Progress Notes (Signed)
06/10/2022 Hailey Ross [id number] May 21, 1970   HISTORY OF PRESENT ILLNESS: This is a 52 year old female who is a patient Dr. Blanch Media.  She is here for follow-up of her abdominal pain.  She was seen by me in January for complaints of upper abdominal pain and early satiety as well as some stool leakage.  Gastric emptying scan was abnormal with findings compatible with delayed gastric emptying.  Only 47% emptying at 4 hours with normal being greater than or equal to 90%.  I placed her on Reglan 5 mg before meals and at bedtime.  She says that she is only been taking it maybe a couple of times a day, as she does not really eat meals often, maybe about once a day in the evening.  Otherwise she just kind of snacks throughout the day.  She says that she feels like maybe it has helped a little bit.  I also asked her to start Benefiber powder 2 teaspoons mixed in 8 ounces of liquid daily.  Says that she is been doing that, but has not noticed a huge difference with that either in regards to the stool leakage.  EGD 09/2019:   1. Nonspecific gastric erythema. 2. GERD   Negative Hpylori.   Colonoscopy 09/2019 was normal.  Past Medical History:  Diagnosis Date   Acute cholecystitis 08/24/2016   Acute kidney injury superimposed on chronic kidney disease ([place]) 06/20/2019   Acute respiratory failure with hypoxia ([place]) 06/19/2019   Annual visit for general adult medical examination with abnormal findings 04/16/2019   Arthritis    right knee   Bronchiectasis ([place])    CKD (chronic kidney disease)    Iron deficiency    Lightheadedness 05/10/2019   PE (pulmonary thromboembolism) ([place]) 06/2019   Pre-syncope 04/16/2019   Sarcoidosis    Sarcoidosis    Sleep apnea    c pap   Smoker 02/12/2014   Past Surgical History:  Procedure Laterality Date   CHOLECYSTECTOMY N/A 08/25/2016   Procedure: LAPAROSCOPIC CHOLECYSTECTOMY;  Surgeon: Coralie Keens, MD;  Location: [place];  Service: General;  Laterality:  N/A;   TUBAL LIGATION  02/1993    reports that she quit smoking about 7 years ago. Her smoking use included cigarettes. She has a 20.00 pack-year smoking history. She has been exposed to tobacco smoke. She has never used smokeless tobacco. She reports that she does not currently use alcohol. She reports that she does not use drugs. family history includes Cancer in her mother; Colon polyps in her mother; Healthy in her daughter, sister, sister, sister, sister, son, and son; Hypertension in her mother. No Known Allergies    Outpatient Encounter Medications as of 06/10/2022  Medication Sig   acetaminophen (TYLENOL) 500 MG tablet Take 500 mg by mouth every 4 (four) hours as needed for mild pain, moderate pain or headache.    albuterol (VENTOLIN HFA) 108 (90 Base) MCG/ACT inhaler Inhale 2 puffs into the lungs every 6 (six) hours as needed for wheezing or shortness of breath.   azelastine (ASTELIN) 0.1 % nasal spray Place 1 spray into both nostrils at bedtime. Use in each nostril as directed   benzonatate (TESSALON) 100 MG capsule Take 1 capsule (100 mg total) by mouth 3 (three) times daily as needed for cough. Do not take with alcohol or while driving or operating heavy machinery.  May cause drowsiness.   diltiazem (CARDIZEM) 30 MG tablet Take 1 tablet (30 mg total) by mouth 2 (two) times daily. May  take an additional 30 mg tablet daily as needed for palpitations   fluticasone (FLONASE) 50 MCG/ACT nasal spray Place 1 spray into both nostrils at bedtime.   lisinopril (ZESTRIL) 5 MG tablet Take 1 tablet (5 mg total) by mouth daily.   LORazepam (ATIVAN) 0.5 MG tablet Take 0.5 mg by mouth daily as needed.   metoCLOPramide (REGLAN) 5 MG tablet Take 1 tablet (5 mg total) by mouth 4 (four) times daily. Before meals and at bedtime   pantoprazole (PROTONIX) 40 MG tablet TAKE 1 TABLET(40 MG) BY MOUTH DAILY   prazosin (MINIPRESS) 1 MG capsule 1 mg if needed   rivaroxaban (XARELTO) 20 MG TABS tablet Take 1 tablet  (20 mg total) by mouth daily with supper.   zolpidem (AMBIEN) 10 MG tablet Take 10 mg by mouth at bedtime as needed.   No facility-administered encounter medications on file as of 06/10/2022.     REVIEW OF SYSTEMS  : All other systems reviewed and negative except where noted in the History of Present Illness.   PHYSICAL EXAM: BP (!) 122/90 (BP Location: Left Arm, Patient Position: Sitting, Cuff Size: Large)   Pulse 68   Ht '5\' 5"'$  (1.651 m)   Wt 226 lb 8 oz (102.7 kg)   LMP 04/01/2021 (Exact Date)   BMI 37.69 kg/m  General: Well developed female in no acute distress Head: Normocephalic and atraumatic Eyes:  Sclerae anicteric, conjunctiva pink. Ears: Normal auditory acuity Lungs: Clear throughout to auscultation; no W/R/R. Heart: Regular rate and rhythm; no M/R/G. Abdomen: Soft, non-distended.  BS present.  Non-tender. Musculoskeletal: Symmetrical with no gross deformities  Skin: No lesions on visible extremities Extremities: No edema  Neurological: Alert oriented x 4, grossly non-focal Psychological:  Alert and cooperative. Normal mood and affect  ASSESSMENT AND PLAN: *Upper to mid abdominal pain and early satiety: GES showed delayed gastric emptying.  Prescribed Reglan 5 mg 4 times daily, but is only using it maybe a couple of times a day because she does not eat meals 3 times a day.  I have advised her to take it before her largest meal which is usually in the evenings, at bedtime, and first thing in the morning regularly to see how she does with that.  New prescription sent to pharmacy.  Could consider trying to have her restart her nortriptyline every night and increase the dose of that or since she does not really use that even try amitriptyline or mirtazapine instead.  If she continues with primary complaints of abdominal pain. *Stool leakage: Describes that after she has a bowel movement and cleans herself that she will go to the bathroom later on and will have stool in the  perianal area.  Maybe she is not completely emptying the rectum.  Says that Benefiber 2 teaspoons mixed in 8 ounces of liquid daily is not really helping much, but she is tolerating it.  Will have her increase it to twice daily.  **We will follow-up 4 to 6 weeks.   CC:  Alvira Monday, FNP

## 2022-06-10 NOTE — Patient Instructions (Addendum)
Start Benefiber  2 teaspoons in 8 ounces of liquid twice daily.   We have sent the following medications to your pharmacy for you to pick up at your convenience: Reglan 5 mg three times daily, first thing in the morning, before dinner and before bed.   _______________________________________________________  If your blood pressure at your visit was 140/90 or greater, please contact your primary care physician to follow up on this.  _______________________________________________________  If you are age 50 or older, your body mass index should be between 23-30. Your Body mass index is 37.69 kg/m. If this is out of the aforementioned range listed, please consider follow up with your Primary Care Provider.  If you are age 38 or younger, your body mass index should be between 19-25. Your Body mass index is 37.69 kg/m. If this is out of the aformentioned range listed, please consider follow up with your Primary Care Provider.   ________________________________________________________  The [facility] providers would like to encourage you to use [facility] to communicate with providers for non-urgent requests or questions.  Due to long hold times on the telephone, sending your provider a message by [facility] may be a faster and more efficient way to get a response.  Please allow 48 business hours for a response.  Please remember that this is for non-urgent requests.  _______________________________________________________

## 2022-06-11 NOTE — Progress Notes (Signed)
Noted  

## 2022-06-17 ENCOUNTER — Other Ambulatory Visit (HOSPITAL_BASED_OUTPATIENT_CLINIC_OR_DEPARTMENT_OTHER): Payer: Self-pay

## 2022-06-17 DIAGNOSIS — D860 Sarcoidosis of lung: Secondary | ICD-10-CM

## 2022-06-20 ENCOUNTER — Ambulatory Visit: Payer: 59 | Admitting: Adult Health

## 2022-06-20 ENCOUNTER — Ambulatory Visit (INDEPENDENT_AMBULATORY_CARE_PROVIDER_SITE_OTHER): Payer: 59 | Admitting: Pulmonary Disease

## 2022-06-20 DIAGNOSIS — D860 Sarcoidosis of lung: Secondary | ICD-10-CM | POA: Diagnosis not present

## 2022-06-20 LAB — PULMONARY FUNCTION TEST
DL/VA % pred: 67 %
DL/VA: 2.86 ml/min/mmHg/L
DLCO cor % pred: 32 %
DLCO cor: 6.93 ml/min/mmHg
DLCO unc % pred: 32 %
DLCO unc: 6.93 ml/min/mmHg
FEF 25-75 Post: 1.05 L/sec
FEF 25-75 Pre: 1.08 L/sec
FEF2575-%Change-Post: -2 %
FEF2575-%Pred-Post: 38 %
FEF2575-%Pred-Pre: 39 %
FEV1-%Change-Post: -2 %
FEV1-%Pred-Post: 35 %
FEV1-%Pred-Pre: 36 %
FEV1-Post: 1.01 L
FEV1-Pre: 1.04 L
FEV1FVC-%Change-Post: 5 %
FEV1FVC-%Pred-Pre: 103 %
FEV6-%Change-Post: -6 %
FEV6-%Pred-Post: 32 %
FEV6-%Pred-Pre: 35 %
FEV6-Post: 1.16 L
FEV6-Pre: 1.24 L
FEV6FVC-%Change-Post: 1 %
FEV6FVC-%Pred-Post: 102 %
FEV6FVC-%Pred-Pre: 101 %
FVC-%Change-Post: -7 %
FVC-%Pred-Post: 32 %
FVC-%Pred-Pre: 34 %
FVC-Post: 1.16 L
FVC-Pre: 1.25 L
Post FEV1/FVC ratio: 87 %
Post FEV6/FVC ratio: 100 %
Pre FEV1/FVC ratio: 83 %
Pre FEV6/FVC Ratio: 99 %
RV % pred: 77 %
RV: 1.45 L
TLC % pred: 62 %
TLC: 3.23 L

## 2022-06-20 NOTE — Progress Notes (Signed)
Full PFT Performed Today  

## 2022-06-20 NOTE — Patient Instructions (Signed)
Full PFT Performed Today  

## 2022-06-21 ENCOUNTER — Ambulatory Visit (INDEPENDENT_AMBULATORY_CARE_PROVIDER_SITE_OTHER): Payer: Managed Care, Other (non HMO) | Admitting: Adult Health

## 2022-06-21 ENCOUNTER — Encounter: Payer: Self-pay | Admitting: Adult Health

## 2022-06-21 VITALS — BP 128/88 | HR 65 | Temp 98.3°F | Ht 65.0 in | Wt 228.4 lb

## 2022-06-21 DIAGNOSIS — G4733 Obstructive sleep apnea (adult) (pediatric): Secondary | ICD-10-CM

## 2022-06-21 DIAGNOSIS — D860 Sarcoidosis of lung: Secondary | ICD-10-CM

## 2022-06-21 DIAGNOSIS — I2699 Other pulmonary embolism without acute cor pulmonale: Secondary | ICD-10-CM

## 2022-06-21 DIAGNOSIS — J479 Bronchiectasis, uncomplicated: Secondary | ICD-10-CM

## 2022-06-21 MED ORDER — SPIRIVA RESPIMAT 2.5 MCG/ACT IN AERS: 2.0000 | INHALATION_SPRAY | Freq: Every day | RESPIRATORY_TRACT | 5 refills | Status: DC

## 2022-06-21 MED ORDER — SPIRIVA RESPIMAT 1.25 MCG/ACT IN AERS: 2.0000 | INHALATION_SPRAY | Freq: Every day | RESPIRATORY_TRACT | 0 refills | Status: DC

## 2022-06-21 NOTE — Assessment & Plan Note (Signed)
Fibrotic pulmonary sarcoidosis with bronchiectasis-PFT show further decline in lung function.  Patient has moderate to severe restriction and decreased diffusing capacity.  Will check high-resolution CT chest for progressive fibrotic changes. Add Spiriva to see if this helps with symptom burden.  Would recommend changing ACE inhibitor as may be aggravating her daily chronic cough.  Activity as tolerated.  Yearly eye exams.  Plan  Patient Instructions  Begin Spiriva 2 puffs daily .  Mucinex DM Twice daily As needed  cough/congestion  Continue on Xarelto '20mg'$  daily.  Avoid NSAID meds , (advil, motrin, etc)  Continue on Flutter valve for cough/congestion .  Albuterol inhaler As needed   Advance activity as tolerated.  Discuss with Nephrology that Lisinopril may be making your cough worse.  Set up for HRCT Chest.  Yearly eye exams   Wear CPAP At bedtime  all night long  Saline nasal spray and gel As needed   Wear for at least 4-6 hrs or more each night .  Do not drive if sleepy .  Bring SD card next visit .   Follow up with Dr. Halford Chessman or Landon Truax NP in 3 months  Please contact office for sooner follow up if symptoms do not improve or worsen or seek emergency care

## 2022-06-21 NOTE — Assessment & Plan Note (Signed)
Continue on CPAP at bedtime.  Encouraged on compliance.  Download on return.  Patient is to bring her card

## 2022-06-21 NOTE — Patient Instructions (Addendum)
Begin Spiriva 2 puffs daily .  Mucinex DM Twice daily As needed  cough/congestion  Continue on Xarelto '20mg'$  daily.  Avoid NSAID meds , (advil, motrin, etc)  Continue on Flutter valve for cough/congestion .  Albuterol inhaler As needed   Advance activity as tolerated.  Discuss with Nephrology that Lisinopril may be making your cough worse.  Set up for HRCT Chest.  Yearly eye exams   Wear CPAP At bedtime  all night long  Saline nasal spray and gel As needed   Wear for at least 4-6 hrs or more each night .  Do not drive if sleepy .  Bring SD card next visit .   Follow up with Dr. Halford Chessman or Symia Herdt NP in 3 months  Please contact office for sooner follow up if symptoms do not improve or worsen or seek emergency care

## 2022-06-21 NOTE — Assessment & Plan Note (Signed)
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  Continue current regimen.  Patient education given

## 2022-06-21 NOTE — Assessment & Plan Note (Signed)
Continue with mucociliary clearance.  Add Spiriva.  Flutter valve as needed.  Plan  Patient Instructions  Begin Spiriva 2 puffs daily .  Mucinex DM Twice daily As needed  cough/congestion  Continue on Xarelto '20mg'$  daily.  Avoid NSAID meds , (advil, motrin, etc)  Continue on Flutter valve for cough/congestion .  Albuterol inhaler As needed   Advance activity as tolerated.  Discuss with Nephrology that Lisinopril may be making your cough worse.  Set up for HRCT Chest.  Yearly eye exams   Wear CPAP At bedtime  all night long  Saline nasal spray and gel As needed   Wear for at least 4-6 hrs or more each night .  Do not drive if sleepy .  Bring SD card next visit .   Follow up with Dr. Halford Chessman or Cambridge Deleo NP in 3 months  Please contact office for sooner follow up if symptoms do not improve or worsen or seek emergency care  n

## 2022-06-21 NOTE — Progress Notes (Signed)
$'@Patient'p$  ID: Hailey Ross, female    DOB: 1970-12-01, 52 y.o.   MRN: [id number]  Chief Complaint  Patient presents with   Follow-up    Referring provider: Alvira Monday, FNP  HPI: 52 year old female former smoker followed for fibrotic pulmonary sarcoidosis, bronchiectasis and obstructive sleep apnea.  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 Medical history significant for chronic kidney disease and hypertension Raises her grandchildren (daughter passed away)    TEST/EVENTS :  Labs 04/21/15 >> HIV negative, Quantiferon gold negative, ACE 195, ANA, RF negative, ESR 32     Chest imaging:  CT chest 03/18/15 >> biapical thickening with traction BTX, BTX RML and lingula, subcarinal LAN 1.7 cm, patchy increased interstitial markings   HRCT chest 03/07/18 >> borderline LAN, patchy GGO, septal thickening, architectural distortion, cylindrical and varicose BTX     CT chest June 18, 2019 showed peripheral segmental and subsegmental bilateral pulmonary emboli, bulky mediastinal and hilar lymph nodes, bilateral apical and lingular honeycombing and bilateral upper lobe mild traction bronchiectasis.  Nonspecific groundglass opacities in the left base and right middle lobe.  Not significantly changed  CT chest Aug 13, 2021 right hilar and mediastinal adenopathy similar, biapical subpleural fibrosis with honeycombing and traction bronchiectasis cluster of groundglass density in the right lower lobe, negative for PE   Venous Dopplers were negative for DVT.     2D echo showed a preserved EF.  Mildly reduced right ventricular systolic function and moderately elevated pulmonary artery systolic pressure at 57 mmHg.  Moderate grade 3 protruding plaque involving the distal aortic arch and proximal descending aorta   Echo 09/2019    Home sleep study was done July 22, 2019.  This showed mild sleep apnea with an AHI at 9.5/hour and SPO2 low at 74%   PFT  PFT  05/26/15 >> FEV1 1.67 (66%), FEV1% 88, TLC 2/78 (53%), DLCO 36%   09/26/2019-pulmonary function test-FEV1 54%, ratio 91, FVC 47%, DLCO 45%  06/21/2022 Follow up ; Sarcoid, PE, OSA Patient presents for 60-monthfollow-up.  Patient has underlying sarcoidosis with bronchiectasis.  Complains of intermittent cough and shortness of breath that is chronic -no significant changes since last office visit .  Activity level is the same. . She tries to stay active with grandkids. Is able to do housework but has to rest between activities. Unable to work, applying for disability.  PFTs done on June 20, 2022 showed decreased lung function with FEV1 at 36%, ratio 83, FVC 34%, no significant bronchodilator response, total lung capacity 62%, DLCO 32%. Patient is on ACE inhibitor. Seen in urgent care last month for cough. Chest xray showed chronic changes. Treated for viral cough with Tessalon. Cough is better. Has daily cough that waxes and wanes.  Has a history of unprovoked PE in the past on lifelong anticoagulation therapy with Xarelto.  Endorses compliance.  Denies any hemoptysis. Patient does have sleep apnea is on nocturnal CPAP.  Patient says she does not wear her CPAP every night. Is wearing more since last  visit.  She did not bring in her CPAP SD card.  Was unable to get download.  We discussed importance of compliance.    No Known Allergies  Immunization History  Administered Date(s) Administered   Influenza Inj Mdck Quad Pf 12/25/2019   Influenza,inj,Quad PF,6+ Mos 12/21/2018, 02/22/2021, 12/17/2021   Influenza,inj,Quad PF,6-35 Mos 01/10/2019   PFIZER(Purple Top)SARS-COV-2 Vaccination 08/14/2019, 09/10/2019, 02/07/2020   Pneumococcal Polysaccharide-23 02/12/2014   Tdap 02/12/2014  Zoster Recombinat (Shingrix) 12/17/2021    Past Medical History:  Diagnosis Date   Acute cholecystitis 08/24/2016   Acute kidney injury superimposed on chronic kidney disease ([place]) 06/20/2019   Acute respiratory  failure with hypoxia ([place]) 06/19/2019   Annual visit for general adult medical examination with abnormal findings 04/16/2019   Arthritis    right knee   Bronchiectasis ([place])    CKD (chronic kidney disease)    Iron deficiency    Lightheadedness 05/10/2019   PE (pulmonary thromboembolism) ([place]) 06/2019   Pre-syncope 04/16/2019   Sarcoidosis    Sarcoidosis    Sleep apnea    c pap   Smoker 02/12/2014    Tobacco History: Social History   Tobacco Use  Smoking Status Former   Packs/day: 1.00   Years: 20.00   Total pack years: 20.00   Types: Cigarettes   Quit date: 03/21/2015   Years since quitting: 7.2   Passive exposure: Past  Smokeless Tobacco Never   Counseling given: Not Answered   Outpatient Medications Prior to Visit  Medication Sig Dispense Refill   acetaminophen (TYLENOL) 500 MG tablet Take 500 mg by mouth every 4 (four) hours as needed for mild pain, moderate pain or headache.      albuterol (VENTOLIN HFA) 108 (90 Base) MCG/ACT inhaler Inhale 2 puffs into the lungs every 6 (six) hours as needed for wheezing or shortness of breath. 8 g 0   azelastine (ASTELIN) 0.1 % nasal spray Place 1 spray into both nostrils at bedtime. Use in each nostril as directed 30 mL 12   benzonatate (TESSALON) 100 MG capsule Take 1 capsule (100 mg total) by mouth 3 (three) times daily as needed for cough. Do not take with alcohol or while driving or operating heavy machinery.  May cause drowsiness. 21 capsule 0   diltiazem (CARDIZEM) 30 MG tablet Take 1 tablet (30 mg total) by mouth 2 (two) times daily. May take an additional 30 mg tablet daily as needed for palpitations 270 tablet 3   fluticasone (FLONASE) 50 MCG/ACT nasal spray Place 1 spray into both nostrils at bedtime. 16 g 2   lisinopril (ZESTRIL) 5 MG tablet Take 1 tablet (5 mg total) by mouth daily. 90 tablet 1   LORazepam (ATIVAN) 0.5 MG tablet Take 0.5 mg by mouth daily as needed.     metoCLOPramide (REGLAN) 5 MG tablet Take 1 tablet (5 mg  total) by mouth 3 (three) times daily. Before meals and at bedtime 90 tablet 1   pantoprazole (PROTONIX) 40 MG tablet TAKE 1 TABLET(40 MG) BY MOUTH DAILY 90 tablet 3   prazosin (MINIPRESS) 1 MG capsule 1 mg if needed     rivaroxaban (XARELTO) 20 MG TABS tablet Take 1 tablet (20 mg total) by mouth daily with supper. 90 tablet 3   zolpidem (AMBIEN) 10 MG tablet Take 10 mg by mouth at bedtime as needed.     No facility-administered medications prior to visit.     Review of Systems:   Constitutional:   No  weight loss, night sweats,  Fevers, chills,  +fatigue, or  lassitude.  HEENT:   No headaches,  Difficulty swallowing,  Tooth/dental problems, or  Sore throat,                No sneezing, itching, ear ache, nasal congestion, post nasal drip,   CV:  No chest pain,  Orthopnea, PND, swelling in lower extremities, anasarca, dizziness, palpitations, syncope.   GI  No heartburn, indigestion, abdominal pain,  nausea, vomiting, diarrhea, change in bowel habits, loss of appetite, bloody stools.   Resp:  No chest wall deformity  Skin: no rash or lesions.  GU: no dysuria, change in color of urine, no urgency or frequency.  No flank pain, no hematuria   MS:  No joint pain or swelling.  No decreased range of motion.  No back pain.    Physical Exam  BP 128/88   Pulse 65   Temp 98.3 F (36.8 C) (Oral)   Ht '5\' 5"'$  (1.651 m)   Wt 228 lb 6.4 oz (103.6 kg)   LMP 04/01/2021 (Exact Date)   SpO2 93%   BMI 38.01 kg/m   GEN: A/Ox3; pleasant , NAD, well nourished    HEENT:  Reading/AT,  EACs-clear, TMs-wnl, NOSE-clear, THROAT-clear, no lesions, no postnasal drip or exudate noted.   NECK:  Supple w/ fair ROM; no JVD; normal carotid impulses w/o bruits; no thyromegaly or nodules palpated; no lymphadenopathy.    RESP  Clear  P & A; w/o, wheezes/ rales/ or rhonchi. no accessory muscle use, no dullness to percussion  CARD:  RRR, no m/r/g, no peripheral edema, pulses intact, no cyanosis or  clubbing.  GI:   Soft & nt; nml bowel sounds; no organomegaly or masses detected.   Musco: Warm bil, no deformities or joint swelling noted.   Neuro: alert, no focal deficits noted.    Skin: Warm, no lesions or rashes    Lab Results:  CBC     BNP No results found for: "BNP"  ProBNP No results found for: "PROBNP"  Imaging: No results found.       Latest Ref Rng & Units 06/20/2022   11:05 AM 09/26/2019    2:56 PM 05/26/2015    2:59 PM  PFT Results  FVC-Pre L 1.25  P  1.78   FVC-Predicted Pre % 34  P 47  57   FVC-Post L 1.16  P 1.39  1.88   FVC-Predicted Post % 32  P 45  60   Pre FEV1/FVC % % 83  P 91  85   Post FEV1/FCV % % 87  P 85  88   FEV1-Pre L 1.04  P 1.33  1.51   FEV1-Predicted Pre % 36  P 54  59   FEV1-Post L 1.01  P 1.18  1.67   DLCO uncorrected ml/min/mmHg 6.93  P 9.96  9.41   DLCO UNC% % 32  P 45  36   DLCO corrected ml/min/mmHg 6.93  P 10.18  9.38   DLCO COR %Predicted % 32  P 46  36   DLVA Predicted % 67  P 105  73   TLC L 3.23  P 3.17  2.78   TLC % Predicted % 62  P 61  53   RV % Predicted % 77  P 89  44     P Preliminary result    No results found for: "NITRICOXIDE"      Assessment & Plan:   Sarcoidosis of lung (HCC) Fibrotic pulmonary sarcoidosis with bronchiectasis-PFT show further decline in lung function.  Patient has moderate to severe restriction and decreased diffusing capacity.  Will check high-resolution CT chest for progressive fibrotic changes. Add Spiriva to see if this helps with symptom burden.  Would recommend changing ACE inhibitor as may be aggravating her daily chronic cough.  Activity as tolerated.  Yearly eye exams.  Plan  Patient Instructions  Begin Spiriva 2 puffs daily .  Mucinex DM  Twice daily As needed  cough/congestion  Continue on Xarelto '20mg'$  daily.  Avoid NSAID meds , (advil, motrin, etc)  Continue on Flutter valve for cough/congestion .  Albuterol inhaler As needed   Advance activity as tolerated.   Discuss with Nephrology that Lisinopril may be making your cough worse.  Set up for HRCT Chest.  Yearly eye exams   Wear CPAP At bedtime  all night long  Saline nasal spray and gel As needed   Wear for at least 4-6 hrs or more each night .  Do not drive if sleepy .  Bring SD card next visit .   Follow up with Dr. Halford Chessman or Yoanna Jurczyk NP in 3 months  Please contact office for sooner follow up if symptoms do not improve or worsen or seek emergency care     Bronchiectasis ([place]) Continue with mucociliary clearance.  Add Spiriva.  Flutter valve as needed.  Plan  Patient Instructions  Begin Spiriva 2 puffs daily .  Mucinex DM Twice daily As needed  cough/congestion  Continue on Xarelto '20mg'$  daily.  Avoid NSAID meds , (advil, motrin, etc)  Continue on Flutter valve for cough/congestion .  Albuterol inhaler As needed   Advance activity as tolerated.  Discuss with Nephrology that Lisinopril may be making your cough worse.  Set up for HRCT Chest.  Yearly eye exams   Wear CPAP At bedtime  all night long  Saline nasal spray and gel As needed   Wear for at least 4-6 hrs or more each night .  Do not drive if sleepy .  Bring SD card next visit .   Follow up with Dr. Halford Chessman or Nadirah Socorro NP in 3 months  Please contact office for sooner follow up if symptoms do not improve or worsen or seek emergency care  n   Bilateral pulmonary embolism ([place]) 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  Continue current regimen.  Patient education given  OSA (obstructive sleep apnea) Continue on CPAP at bedtime.  Encouraged on compliance.  Download on return.  Patient is to bring her card     I spent   40 minutes dedicated to the care of this patient on the date of this encounter to include pre-visit review of records, face-to-face time with the patient discussing conditions above, post visit ordering of testing, clinical documentation with the electronic health record,  making appropriate referrals as documented, and communicating necessary findings to members of the patients care team.   Rexene Edison, NP 06/21/2022

## 2022-06-22 NOTE — Progress Notes (Signed)
Reviewed and agree with assessment/plan.   Chesley Mires, MD [facility] Pulmonary/Critical Care 06/22/2022, 11:27 AM Pager:  [phone]

## 2022-06-26 LAB — PULMONARY FUNCTION TEST
DL/VA % pred: 67 %
DL/VA: 2.86 ml/min/mmHg/L
DLCO cor % pred: 32 %
DLCO cor: 6.93 ml/min/mmHg
DLCO unc % pred: 32 %
DLCO unc: 6.93 ml/min/mmHg
FEF 25-75 Post: 1.05 L/sec
FEF 25-75 Pre: 1.08 L/sec
FEF2575-%Change-Post: -2 %
FEF2575-%Pred-Post: 38 %
FEF2575-%Pred-Pre: 39 %
FEV1-%Change-Post: -2 %
FEV1-%Pred-Post: 35 %
FEV1-%Pred-Pre: 36 %
FEV1-Post: 1.01 L
FEV1-Pre: 1.04 L
FEV1FVC-%Change-Post: 5 %
FEV1FVC-%Pred-Pre: 103 %
FEV6-%Change-Post: -6 %
FEV6-%Pred-Post: 32 %
FEV6-%Pred-Pre: 35 %
FEV6-Post: 1.16 L
FEV6-Pre: 1.24 L
FEV6FVC-%Change-Post: 1 %
FEV6FVC-%Pred-Post: 102 %
FEV6FVC-%Pred-Pre: 101 %
FVC-%Change-Post: -7 %
FVC-%Pred-Post: 32 %
FVC-%Pred-Pre: 34 %
FVC-Post: 1.16 L
FVC-Pre: 1.25 L
Post FEV1/FVC ratio: 87 %
Post FEV6/FVC ratio: 100 %
Pre FEV1/FVC ratio: 83 %
Pre FEV6/FVC Ratio: 99 %
RV % pred: 77 %
RV: 1.45 L
TLC % pred: 62 %
TLC: 3.23 L

## 2022-07-05 ENCOUNTER — Other Ambulatory Visit: Payer: Self-pay | Admitting: Adult Health

## 2022-07-05 ENCOUNTER — Encounter: Payer: Self-pay | Admitting: Pulmonary Disease

## 2022-07-06 NOTE — Telephone Encounter (Signed)
I have not received any disability forms recently for Hailey Ross.

## 2022-07-26 NOTE — Telephone Encounter (Signed)
PCC's, I am sending you this encounter that has her new insurance info in case you need it.

## 2022-08-12 ENCOUNTER — Ambulatory Visit (INDEPENDENT_AMBULATORY_CARE_PROVIDER_SITE_OTHER): Payer: BC Managed Care – PPO | Admitting: Pulmonary Disease

## 2022-08-12 ENCOUNTER — Encounter: Payer: Self-pay | Admitting: Pulmonary Disease

## 2022-08-12 VITALS — BP 136/88 | HR 63 | Ht 65.0 in | Wt 220.8 lb

## 2022-08-12 DIAGNOSIS — D860 Sarcoidosis of lung: Secondary | ICD-10-CM | POA: Diagnosis not present

## 2022-08-12 DIAGNOSIS — G4733 Obstructive sleep apnea (adult) (pediatric): Secondary | ICD-10-CM | POA: Diagnosis not present

## 2022-08-12 DIAGNOSIS — J479 Bronchiectasis, uncomplicated: Secondary | ICD-10-CM

## 2022-08-12 MED ORDER — BREZTRI AEROSPHERE 160-9-4.8 MCG/ACT IN AERO: 2.0000 | INHALATION_SPRAY | Freq: Two times a day (BID) | RESPIRATORY_TRACT | 3 refills | Status: DC

## 2022-08-12 MED ORDER — BREZTRI AEROSPHERE 160-9-4.8 MCG/ACT IN AERO: 2.0000 | INHALATION_SPRAY | Freq: Two times a day (BID) | RESPIRATORY_TRACT | 6 refills | Status: DC

## 2022-08-12 NOTE — Progress Notes (Signed)
  Compliance DL for pt OV w/ VS Verified by [facility] 08/12/2022

## 2022-08-12 NOTE — Progress Notes (Signed)
[facility]  Chief Complaint  Patient presents with   Follow-up    Pt f/u states that she has had an ongoing cough for 1wk. States that she is doing well otherwise    Constitutional:  BP 136/88   Pulse 63   Ht 5\' 5"  (1.651 m)   Wt 220 lb 12.8 oz (100.2 kg)   LMP 04/01/2021 (Exact Date)   SpO2 94%   BMI 36.74 kg/m   Past Medical History:  CKD, Anemia, PE March 2021  Past Surgical History:  Her  has a past surgical history that includes Tubal ligation (02/1993) and Cholecystectomy (N/A, 08/25/2016).  Brief Summary:  Hailey Ross is a 52 y.o. female former smoker with pulmonary sarcoidosis, obstructive sleep apnea, and allergic rhinitis.      Subjective:   She saw Hailey Ross.  PFT showed progression of diffusion defect.  High resolution CT chest scheduled for June 6.  Spiriva helps, but still has a cough and sometimes brings up clear phlegm.  Her joints have been hurting more and she feels more stiff.  CPAP helps.  She knows she needs to get back into using on a regular basis.  Physical Exam:   Appearance - well kempt   ENMT - no sinus tenderness, no oral exudate, no LAN, Mallampati 3 airway, no stridor, wears dentures, deviated septum  Respiratory - equal breath sounds bilaterally, no wheezing or rales  CV - s1s2 regular rate and rhythm, no murmurs  Ext - no clubbing, no edema  Skin - no rashes  Psych - normal mood and affect  Pulmonary testing:  Labs 04/21/15 >> HIV negative, Quantiferon gold negative, ACE 195, ANA, RF negative, ESR 32 PFT 05/26/15 >> FEV1 1.67 (66%), FEV1% 88, TLC 2/78 (53%), DLCO 36% PFT 09/26/19 >> FEV1 1.33 (54%), FEV1% 91, TLC 3.17 (61%), DLCO 45% PFT 06/20/22 >> FEV1 1.04 (36%), FEV1% 83, TLC 3.23 (62%), DLCO 32%  Chest Imaging:  CT chest 03/18/15 >> biapical thickening with traction BTX, BTX RML and lingula, subcarinal LAN 1.7 cm, patchy increased interstitial markings HRCT chest  03/07/18 >> borderline LAN, patchy GGO, septal thickening, architectural distortion, cylindrical and varicose BTX CT angio chest 06/18/19 >> peripheral and subsegmental PEs, bulky LAN CT angio chest 07/15/19 >> apical fibrosis, no change in LAN CT angio chest 12/23/20 >> apical fibrosis with honeycombing and traction BTX  Sleep Tests:  HST 07/23/19 >> AHI 9.5, SpO2 low 74% Auto CPAP 07/05/21 to 08/03/21 >> used on 13 of 30 nights with average 3 hrs 45 minutes.  Average AHI 0.1 with median CPAP 8 and 95 th percentile CPAP 12 cm H2O  Cardiac Tests:  Echo 10/19/20 >> EF 55 to 60%, RVSP 35.5 mmHg  Social History:  She  reports that she quit smoking about 7 years ago. Her smoking use included cigarettes. She has a 20.00 pack-year smoking history. She has been exposed to tobacco smoke. She has never used smokeless tobacco. She reports that she does not currently use alcohol. She reports that she does not use drugs.  Family History:  Her family history includes Cancer in her mother; Colon polyps in her mother; Healthy in her daughter, sister, sister, sister, sister, son, and son; Hypertension in her mother.     Assessment/Plan:   Obstructive sleep apnea. - she is compliant with therapy and reports benefit from CPAP - uses [employer] for her DME - continue auto CPAP 5 - 15 cm H2O - encouraged her  to use CPAP whenever she is asleep  Upper airway cough with post nasal drip with deviated nasal septum. - will have her try nasal irrigation, flonase, and azelastine  Fibrotic pulmonary sarcoidosis. - has progressive diffusion defect - HRCT chest scheduled for June 2024 - will try breztri in place of spiriva; demonstrated inhaler technique and provided sample  Unprovoked pulmonary embolism from March 2021 with positive lupus anticoagulant. - remains on life long xarelto - followed by Dr. Ellin Saba with Hematology/Oncology  Positive ANA. - has progressive joint stiffness - follow up with Dr. Pollyann Savoy with rheumatology  CKD 2. - followed by Dr. Wolfgang Phoenix with nephrology  Time Spent Involved in Patient Care on Day of Examination:  37 minutes  Follow up:   Patient Instructions  Breztri two puffs in the morning and two puffs in the evening, and rinse your mouth after each use.  Stop spiriva once you start breztri.  Follow up in June after your CT chest is done.  Medication List:   Allergies as of 08/12/2022   No Known Allergies      Medication List        Accurate as of Aug 12, 2022  9:49 AM. If you have any questions, ask your nurse or doctor.          STOP taking these medications    Spiriva Respimat 1.25 MCG/ACT Aers Generic drug: Tiotropium Bromide Monohydrate Stopped by: Coralyn Helling, MD   Spiriva Respimat 2.5 MCG/ACT Aers Generic drug: Tiotropium Bromide Monohydrate Stopped by: Coralyn Helling, MD       TAKE these medications    acetaminophen 500 MG tablet Commonly known as: TYLENOL Take 500 mg by mouth every 4 (four) hours as needed for mild pain, moderate pain or headache.   albuterol 108 (90 Base) MCG/ACT inhaler Commonly known as: VENTOLIN HFA Inhale 2 puffs into the lungs every 6 (six) hours as needed for wheezing or shortness of breath.   azelastine 0.1 % nasal spray Commonly known as: ASTELIN Place 1 spray into both nostrils at bedtime. Use in each nostril as directed   benzonatate 100 MG capsule Commonly known as: TESSALON Take 1 capsule (100 mg total) by mouth 3 (three) times daily as needed for cough. Do not take with alcohol or while driving or operating heavy machinery.  May cause drowsiness.   Breztri Aerosphere 160-9-4.8 MCG/ACT Aero Generic drug: Budeson-Glycopyrrol-Formoterol Inhale 2 puffs into the lungs in the morning and at bedtime. Started by: Coralyn Helling, MD   Jerrye Bushy [phone] MCG/ACT Aero Generic drug: Budeson-Glycopyrrol-Formoterol Inhale 2 puffs into the lungs in the morning and at bedtime. Started by:  Coralyn Helling, MD   diltiazem 30 MG tablet Commonly known as: CARDIZEM Take 1 tablet (30 mg total) by mouth 2 (two) times daily. May take an additional 30 mg tablet daily as needed for palpitations   fluticasone 50 MCG/ACT nasal spray Commonly known as: FLONASE Place 1 spray into both nostrils at bedtime.   lisinopril 5 MG tablet Commonly known as: ZESTRIL Take 1 tablet (5 mg total) by mouth daily.   LORazepam 0.5 MG tablet Commonly known as: ATIVAN Take 0.5 mg by mouth daily as needed.   metoCLOPramide 5 MG tablet Commonly known as: Reglan Take 1 tablet (5 mg total) by mouth 3 (three) times daily. Before meals and at bedtime   pantoprazole 40 MG tablet Commonly known as: PROTONIX TAKE 1 TABLET(40 MG) BY MOUTH DAILY   prazosin 1 MG capsule Commonly known as: MINIPRESS  1 mg if needed   Xarelto 20 MG Tabs tablet Generic drug: rivaroxaban TAKE 1 TABLET(20 MG) BY MOUTH DAILY WITH SUPPER   zolpidem 10 MG tablet Commonly known as: AMBIEN Take 10 mg by mouth at bedtime as needed.        Signature:  Coralyn Helling, MD [facility] Pulmonary/Critical Care Pager - [phone] 08/12/2022, 9:49 AM

## 2022-08-12 NOTE — Patient Instructions (Signed)
Breztri two puffs in the morning and two puffs in the evening, and rinse your mouth after each use.  Stop spiriva once you start breztri.  Follow up in June after your CT chest is done.

## 2022-08-17 NOTE — Progress Notes (Deleted)
Office Visit Note  Patient: Hailey Ross             Date of Birth: [birth year]           MRN: [id number]             PCP: Gilmore Laroche, FNP Referring: Donell Beers, FNP Visit Date: 08/31/2022 Occupation: @GUAROCC @  Subjective:  No chief complaint on file.   History of Present Illness: Hailey Ross is a 52 y.o. female ***     Activities of Daily Living:  Patient reports morning stiffness for *** {minute/hour:19697}.   Patient {ACTIONS;DENIES/REPORTS:21021675::"Denies"} nocturnal pain.  Difficulty dressing/grooming: {ACTIONS;DENIES/REPORTS:21021675::"Denies"} Difficulty climbing stairs: {ACTIONS;DENIES/REPORTS:21021675::"Denies"} Difficulty getting out of chair: {ACTIONS;DENIES/REPORTS:21021675::"Denies"} Difficulty using hands for taps, buttons, cutlery, and/or writing: {ACTIONS;DENIES/REPORTS:21021675::"Denies"}  No Rheumatology ROS completed.   PMFS History:  Patient Active Problem List   Diagnosis Date Noted   Upper abdominal pain 04/29/2022   Early satiety 04/29/2022   Acute bronchitis 03/21/2022   Immunization due 12/17/2021   Need for immunization against influenza 12/17/2021   Fatigue 12/17/2021   Encounter for routine adult physical exam with abnormal findings 08/30/2021   CAP (community acquired pneumonia) 06/16/2021   Acute midline low back pain without sciatica 04/22/2021   Left lower quadrant abdominal pain 04/20/2021   Atypical chest pain 01/26/2021   Synovitis of left knee 01/19/2021   GERD (gastroesophageal reflux disease) 12/29/2020   Abdominal pain, epigastric 12/28/2020   Primary osteoarthritis of left knee 10/27/2020   Grief at loss of child 06/09/2020   Insomnia 06/09/2020   Right ankle swelling 02/05/2020   Encounter for examination following treatment at hospital 01/28/2020   Palpitations 01/28/2020   Menorrhagia with regular cycle 10/15/2019   OSA (obstructive sleep apnea) 10/08/2019   Right knee pain 08/29/2019    Positive ANA (antinuclear antibody) 08/29/2019   Essential hypertension 07/04/2019   Pain and swelling of right knee 07/04/2019   Hyperlipidemia 07/04/2019   Stage 2 chronic kidney disease 07/04/2019   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   Encounter for support and coordination of transition of care 07/04/2019   Daytime hypersomnolence 07/02/2019   Pulmonary hypertension (HCC) 07/02/2019   CKD (chronic kidney disease)    Bilateral pulmonary embolism (HCC) 06/18/2019   Lightheadedness 05/10/2019   Vitamin D deficiency 05/10/2019   Class 2 obesity due to excess calories with body mass index (BMI) of 37.0 to 37.9 in adult 04/16/2019   Sarcoidosis 05/04/2015   Bronchiectasis (HCC) 05/04/2015    Past Medical History:  Diagnosis Date   Acute cholecystitis 08/24/2016   Acute kidney injury superimposed on chronic kidney disease (HCC) 06/20/2019   Acute respiratory failure with hypoxia (HCC) 06/19/2019   Annual visit for general adult medical examination with abnormal findings 04/16/2019   Arthritis    right knee   Bronchiectasis (HCC)    CKD (chronic kidney disease)    Iron deficiency    Lightheadedness 05/10/2019   PE (pulmonary thromboembolism) (HCC) 06/2019   Pre-syncope 04/16/2019   Sarcoidosis    Sarcoidosis    Sleep apnea    c pap   Smoker 02/12/2014    Family History  Problem Relation Age of Onset   Hypertension Mother    Cancer Mother    Colon polyps Mother    Healthy Sister    Healthy Sister    Healthy Sister    Healthy Sister    Healthy Son    Healthy Son    Healthy Daughter    Colon cancer  Neg Hx    Esophageal cancer Neg Hx    Rectal cancer Neg Hx    Stomach cancer Neg Hx    Past Surgical History:  Procedure Laterality Date   CHOLECYSTECTOMY N/A 08/25/2016   Procedure: LAPAROSCOPIC CHOLECYSTECTOMY;  Surgeon: Abigail Miyamoto, MD;  Location: [facility] OR;  Service: General;  Laterality: N/A;   TUBAL LIGATION  02/1993   Social History   Social History Narrative   Live  with partners [name]-19 years   3 children.    1-[name] (girl) 27 at home with her: 2 grandchildren in her home as well    [name]- 26 expecting   [name]-25      Enjoy: read, play games on tablet, grandkids      Diet: Veggies, does not eat a lot of fried foods, enjoys chicken and fruit   Caffeine: sodas and coffee (with sugar)   Water: Does not drink a lot at all      Wears seat beat   Does not wear sunscreen   Smoke and carbon monoxide detectors   Does not use phone while driving    Immunization History  Administered Date(s) Administered   Influenza Inj Mdck Quad Pf 12/25/2019   Influenza,inj,Quad PF,6+ Mos 12/21/2018, 02/22/2021, 12/17/2021   Influenza,inj,Quad PF,6-35 Mos 01/10/2019   PFIZER(Purple Top)SARS-COV-2 Vaccination 08/14/2019, 09/10/2019, 02/07/2020   Pneumococcal Polysaccharide-23 02/12/2014   Tdap 02/12/2014   Zoster Recombinat (Shingrix) 12/17/2021     Objective: Vital Signs: LMP 04/01/2021 (Exact Date)    Physical Exam   Musculoskeletal Exam: ***  CDAI Exam: CDAI Score: -- Patient Global: --; Provider Global: -- Swollen: --; Tender: -- Joint Exam 08/31/2022   No joint exam has been documented for this visit   There is currently no information documented on the homunculus. Go to the Rheumatology activity and complete the homunculus joint exam.  Investigation: No additional findings.  Imaging: No results found.  Recent Labs: Lab Results  Component Value Date   WBC 8.4 12/20/2021   HGB 12.6 12/20/2021   PLT 278 12/20/2021   NA 141 11/19/2021   K 4.3 11/19/2021   CL 105 11/19/2021   CO2 27 11/19/2021   GLUCOSE 106 (H) 11/19/2021   BUN 15 11/19/2021   CREATININE 1.17 (H) 11/19/2021   BILITOT 0.5 11/19/2021   ALKPHOS 75 11/19/2021   AST 19 11/19/2021   ALT 15 11/19/2021   PROT 7.3 11/19/2021   ALBUMIN 3.8 11/19/2021   CALCIUM 9.3 11/19/2021   GFRAA 62 09/23/2020   QFTBGOLD Negative 05/01/2015   QFTBGOLDPLUS NEGATIVE 11/08/2019     Speciality Comments: No specialty comments available.  Procedures:  No procedures performed Allergies: Patient has no known allergies.   Assessment / Plan:     Visit Diagnoses: Sarcoidosis  Positive ANA (antinuclear antibody)  Primary osteoarthritis of both hands  Trochanteric bursitis of both hips  Effusion, right knee  Chronic pain of left knee  Stage 3a chronic kidney disease (HCC)  Pulmonary hypertension (HCC)  Bilateral pulmonary embolism (HCC)  Bronchiectasis without complication (HCC)  Mixed hyperlipidemia  Essential hypertension  Secondary hyperparathyroidism (HCC)  OSA (obstructive sleep apnea)  Daytime hypersomnolence  Trochanteric bursitis of left hip  Vitamin D deficiency  Orders: No orders of the defined types were placed in this encounter.  No orders of the defined types were placed in this encounter.   Face-to-face time spent with patient was *** minutes. Greater than 50% of time was spent in counseling and coordination of care.  Follow-Up Instructions: No follow-ups  on file.   Gearldine Bienenstock, PA-C  Note - This record has been created using Dragon software.  Chart creation errors have been sought, but may not always  have been located. Such creation errors do not reflect on  the standard of medical care.

## 2022-08-29 NOTE — Progress Notes (Unsigned)
Office Visit Note  Patient: Hailey Ross             Date of Birth: 07-03-1970           MRN: [id number]             PCP: Gilmore Laroche, FNP Referring: Donell Beers, FNP Visit Date: 08/30/2022 Occupation: @GUAROCC @  Subjective:  No chief complaint on file.   History of Present Illness: Hailey Ross is a 52 y.o. female ***     Activities of Daily Living:  Patient reports morning stiffness for *** {minute/hour:19697}.   Patient {ACTIONS;DENIES/REPORTS:21021675::"Denies"} nocturnal pain.  Difficulty dressing/grooming: {ACTIONS;DENIES/REPORTS:21021675::"Denies"} Difficulty climbing stairs: {ACTIONS;DENIES/REPORTS:21021675::"Denies"} Difficulty getting out of chair: {ACTIONS;DENIES/REPORTS:21021675::"Denies"} Difficulty using hands for taps, buttons, cutlery, and/or writing: {ACTIONS;DENIES/REPORTS:21021675::"Denies"}  No Rheumatology ROS completed.   PMFS History:  Patient Active Problem List   Diagnosis Date Noted   Upper abdominal pain 04/29/2022   Early satiety 04/29/2022   Acute bronchitis 03/21/2022   Immunization due 12/17/2021   Need for immunization against influenza 12/17/2021   Fatigue 12/17/2021   Encounter for routine adult physical exam with abnormal findings 08/30/2021   CAP (community acquired pneumonia) 06/16/2021   Acute midline low back pain without sciatica 04/22/2021   Left lower quadrant abdominal pain 04/20/2021   Atypical chest pain 01/26/2021   Synovitis of left knee 01/19/2021   GERD (gastroesophageal reflux disease) 12/29/2020   Abdominal pain, epigastric 12/28/2020   Primary osteoarthritis of left knee 10/27/2020   Grief at loss of child 06/09/2020   Insomnia 06/09/2020   Right ankle swelling 02/05/2020   Encounter for examination following treatment at hospital 01/28/2020   Palpitations 01/28/2020   Menorrhagia with regular cycle 10/15/2019   OSA (obstructive sleep apnea) 10/08/2019   Right knee pain 08/29/2019    Positive ANA (antinuclear antibody) 08/29/2019   Essential hypertension 07/04/2019   Pain and swelling of right knee 07/04/2019   Hyperlipidemia 07/04/2019   Stage 2 chronic kidney disease 07/04/2019   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   Encounter for support and coordination of transition of care 07/04/2019   Daytime hypersomnolence 07/02/2019   Pulmonary hypertension (HCC) 07/02/2019   CKD (chronic kidney disease)    Bilateral pulmonary embolism (HCC) 06/18/2019   Lightheadedness 05/10/2019   Vitamin D deficiency 05/10/2019   Class 2 obesity due to excess calories with body mass index (BMI) of 37.0 to 37.9 in adult 04/16/2019   Sarcoidosis 05/04/2015   Bronchiectasis (HCC) 05/04/2015    Past Medical History:  Diagnosis Date   Acute cholecystitis 08/24/2016   Acute kidney injury superimposed on chronic kidney disease (HCC) 06/20/2019   Acute respiratory failure with hypoxia (HCC) 06/19/2019   Annual visit for general adult medical examination with abnormal findings 04/16/2019   Arthritis    right knee   Bronchiectasis (HCC)    CKD (chronic kidney disease)    Iron deficiency    Lightheadedness 05/10/2019   PE (pulmonary thromboembolism) (HCC) 06/2019   Pre-syncope 04/16/2019   Sarcoidosis    Sarcoidosis    Sleep apnea    c pap   Smoker 02/12/2014    Family History  Problem Relation Age of Onset   Hypertension Mother    Cancer Mother    Colon polyps Mother    Healthy Sister    Healthy Sister    Healthy Sister    Healthy Sister    Healthy Son    Healthy Son    Healthy Daughter    Colon cancer  Neg Hx    Esophageal cancer Neg Hx    Rectal cancer Neg Hx    Stomach cancer Neg Hx    Past Surgical History:  Procedure Laterality Date   CHOLECYSTECTOMY N/A 08/25/2016   Procedure: LAPAROSCOPIC CHOLECYSTECTOMY;  Surgeon: Abigail Miyamoto, MD;  Location: [facility] OR;  Service: General;  Laterality: N/A;   TUBAL LIGATION  02/1993   Social History   Social History Narrative   Live  with partners [name]-19 years   3 children.    1-[name] (girl) 27 at home with her: 2 grandchildren in her home as well    [name]- 26 expecting   [name]-25      Enjoy: read, play games on tablet, grandkids      Diet: Veggies, does not eat a lot of fried foods, enjoys chicken and fruit   Caffeine: sodas and coffee (with sugar)   Water: Does not drink a lot at all      Wears seat beat   Does not wear sunscreen   Smoke and carbon monoxide detectors   Does not use phone while driving    Immunization History  Administered Date(s) Administered   Influenza Inj Mdck Quad Pf 12/25/2019   Influenza,inj,Quad PF,6+ Mos 12/21/2018, 02/22/2021, 12/17/2021   Influenza,inj,Quad PF,6-35 Mos 01/10/2019   PFIZER(Purple Top)SARS-COV-2 Vaccination 08/14/2019, 09/10/2019, 02/07/2020   Pneumococcal Polysaccharide-23 02/12/2014   Tdap 02/12/2014   Zoster Recombinat (Shingrix) 12/17/2021     Objective: Vital Signs: LMP 04/01/2021 (Exact Date)    Physical Exam   Musculoskeletal Exam: ***  CDAI Exam: CDAI Score: -- Patient Global: --; Provider Global: -- Swollen: --; Tender: -- Joint Exam 08/30/2022   No joint exam has been documented for this visit   There is currently no information documented on the homunculus. Go to the Rheumatology activity and complete the homunculus joint exam.  Investigation: No additional findings.  Imaging: No results found.  Recent Labs: Lab Results  Component Value Date   WBC 8.4 12/20/2021   HGB 12.6 12/20/2021   PLT 278 12/20/2021   NA 141 11/19/2021   K 4.3 11/19/2021   CL 105 11/19/2021   CO2 27 11/19/2021   GLUCOSE 106 (H) 11/19/2021   BUN 15 11/19/2021   CREATININE 1.17 (H) 11/19/2021   BILITOT 0.5 11/19/2021   ALKPHOS 75 11/19/2021   AST 19 11/19/2021   ALT 15 11/19/2021   PROT 7.3 11/19/2021   ALBUMIN 3.8 11/19/2021   CALCIUM 9.3 11/19/2021   GFRAA 62 09/23/2020   QFTBGOLD Negative 05/01/2015   QFTBGOLDPLUS NEGATIVE 11/08/2019     Speciality Comments: No specialty comments available.  Procedures:  No procedures performed Allergies: Patient has no known allergies.   Assessment / Plan:     Visit Diagnoses: No diagnosis found.  Orders: No orders of the defined types were placed in this encounter.  No orders of the defined types were placed in this encounter.   Face-to-face time spent with patient was *** minutes. Greater than 50% of time was spent in counseling and coordination of care.  Follow-Up Instructions: No follow-ups on file.   Ellen Henri, CMA  Note - This record has been created using Animal nutritionist.  Chart creation errors have been sought, but may not always  have been located. Such creation errors do not reflect on  the standard of medical care.

## 2022-08-30 ENCOUNTER — Ambulatory Visit: Payer: BC Managed Care – PPO | Attending: Rheumatology | Admitting: Physician Assistant

## 2022-08-30 ENCOUNTER — Encounter: Payer: Self-pay | Admitting: Physician Assistant

## 2022-08-30 ENCOUNTER — Ambulatory Visit: Payer: BC Managed Care – PPO

## 2022-08-30 ENCOUNTER — Ambulatory Visit (INDEPENDENT_AMBULATORY_CARE_PROVIDER_SITE_OTHER): Payer: BC Managed Care – PPO

## 2022-08-30 VITALS — BP 130/87 | HR 62 | Resp 16 | Ht 65.0 in | Wt 221.4 lb

## 2022-08-30 DIAGNOSIS — M79641 Pain in right hand: Secondary | ICD-10-CM | POA: Diagnosis not present

## 2022-08-30 DIAGNOSIS — G4733 Obstructive sleep apnea (adult) (pediatric): Secondary | ICD-10-CM

## 2022-08-30 DIAGNOSIS — R768 Other specified abnormal immunological findings in serum: Secondary | ICD-10-CM | POA: Diagnosis not present

## 2022-08-30 DIAGNOSIS — E559 Vitamin D deficiency, unspecified: Secondary | ICD-10-CM

## 2022-08-30 DIAGNOSIS — M19041 Primary osteoarthritis, right hand: Secondary | ICD-10-CM | POA: Diagnosis not present

## 2022-08-30 DIAGNOSIS — D869 Sarcoidosis, unspecified: Secondary | ICD-10-CM

## 2022-08-30 DIAGNOSIS — M79642 Pain in left hand: Secondary | ICD-10-CM | POA: Diagnosis not present

## 2022-08-30 DIAGNOSIS — I272 Pulmonary hypertension, unspecified: Secondary | ICD-10-CM

## 2022-08-30 DIAGNOSIS — R7689 Other specified abnormal immunological findings in serum: Secondary | ICD-10-CM

## 2022-08-30 DIAGNOSIS — E782 Mixed hyperlipidemia: Secondary | ICD-10-CM

## 2022-08-30 DIAGNOSIS — M25562 Pain in left knee: Secondary | ICD-10-CM | POA: Diagnosis not present

## 2022-08-30 DIAGNOSIS — M25462 Effusion, left knee: Secondary | ICD-10-CM

## 2022-08-30 DIAGNOSIS — G4719 Other hypersomnia: Secondary | ICD-10-CM

## 2022-08-30 DIAGNOSIS — M25461 Effusion, right knee: Secondary | ICD-10-CM

## 2022-08-30 DIAGNOSIS — I2699 Other pulmonary embolism without acute cor pulmonale: Secondary | ICD-10-CM

## 2022-08-30 DIAGNOSIS — G8929 Other chronic pain: Secondary | ICD-10-CM

## 2022-08-30 DIAGNOSIS — M7061 Trochanteric bursitis, right hip: Secondary | ICD-10-CM | POA: Diagnosis not present

## 2022-08-30 DIAGNOSIS — M19042 Primary osteoarthritis, left hand: Secondary | ICD-10-CM

## 2022-08-30 DIAGNOSIS — N1831 Chronic kidney disease, stage 3a: Secondary | ICD-10-CM

## 2022-08-30 DIAGNOSIS — N2581 Secondary hyperparathyroidism of renal origin: Secondary | ICD-10-CM

## 2022-08-30 DIAGNOSIS — I10 Essential (primary) hypertension: Secondary | ICD-10-CM

## 2022-08-30 DIAGNOSIS — M7062 Trochanteric bursitis, left hip: Secondary | ICD-10-CM

## 2022-08-30 DIAGNOSIS — J479 Bronchiectasis, uncomplicated: Secondary | ICD-10-CM

## 2022-08-30 MED ORDER — LIDOCAINE HCL 1 % IJ SOLN
1.5000 mL | INTRAMUSCULAR | Status: AC | PRN
Start: 2022-08-30 — End: 2022-08-30
  Administered 2022-08-30: 1.5 mL

## 2022-08-30 MED ORDER — TRIAMCINOLONE ACETONIDE 40 MG/ML IJ SUSP
40.0000 mg | INTRAMUSCULAR | Status: AC | PRN
Start: 2022-08-30 — End: 2022-08-30
  Administered 2022-08-30: 40 mg via INTRA_ARTICULAR

## 2022-08-30 NOTE — Patient Instructions (Signed)
Knee Exercises Ask your health care provider which exercises are safe for you. Do exercises exactly as told by your health care provider and adjust them as directed. It is normal to feel mild stretching, pulling, tightness, or discomfort as you do these exercises. Stop right away if you feel sudden pain or your pain gets worse. Do not begin these exercises until told by your health care provider. Stretching and range-of-motion exercises These exercises warm up your muscles and joints and improve the movement and flexibility of your knee. These exercises also help to relieve pain and swelling. Knee extension, prone  Lie on your abdomen (prone position) on a bed. Place your left / right knee just beyond the edge of the surface so your knee is not on the bed. You can put a towel under your left / right thigh just above your kneecap for comfort. Relax your leg muscles and allow gravity to straighten your knee (extension). You should feel a stretch behind your left / right knee. Hold this position for __________ seconds. Scoot up so your knee is supported between repetitions. Repeat __________ times. Complete this exercise __________ times a day. Knee flexion, active  Lie on your back with both legs straight. If this causes back discomfort, bend your left / right knee so your foot is flat on the floor. Slowly slide your left / right heel back toward your buttocks. Stop when you feel a gentle stretch in the front of your knee or thigh (flexion). Hold this position for __________ seconds. Slowly slide your left / right heel back to the starting position. Repeat __________ times. Complete this exercise __________ times a day. Quadriceps stretch, prone  Lie on your abdomen on a firm surface, such as a bed or padded floor. Bend your left / right knee and hold your ankle. If you cannot reach your ankle or pant leg, loop a belt around your foot and grab the belt instead. Gently pull your heel toward your  buttocks. Your knee should not slide out to the side. You should feel a stretch in the front of your thigh and knee (quadriceps). Hold this position for __________ seconds. Repeat __________ times. Complete this exercise __________ times a day. Hamstring, supine  Lie on your back (supine position). Loop a belt or towel over the ball of your left / right foot. The ball of your foot is on the walking surface, right under your toes. Straighten your left / right knee and slowly pull on the belt to raise your leg until you feel a gentle stretch behind your knee (hamstring). Do not let your knee bend while you do this. Keep your other leg flat on the floor. Hold this position for __________ seconds. Repeat __________ times. Complete this exercise __________ times a day. Strengthening exercises These exercises build strength and endurance in your knee. Endurance is the ability to use your muscles for a long time, even after they get tired. Quadriceps, isometric This exercise strengthens the muscles in front of your thigh (quadriceps) without moving your knee joint (isometric). Lie on your back with your left / right leg extended and your other knee bent. Put a rolled towel or small pillow under your knee if told by your health care provider. Slowly tense the muscles in the front of your left / right thigh. You should see your kneecap slide up toward your hip or see increased dimpling just above the knee. This motion will push the back of the knee toward the floor.  For __________ seconds, hold the muscle as tight as you can without increasing your pain. Relax the muscles slowly and completely. Repeat __________ times. Complete this exercise __________ times a day. Straight leg raises This exercise strengthens the muscles in front of your thigh (quadriceps) and the muscles that move your hips (hip flexors). Lie on your back with your left / right leg extended and your other knee bent. Tense the  muscles in the front of your left / right thigh. You should see your kneecap slide up or see increased dimpling just above the knee. Your thigh may even shake a bit. Keep these muscles tight as you raise your leg 4-6 inches (10-15 cm) off the floor. Do not let your knee bend. Hold this position for __________ seconds. Keep these muscles tense as you lower your leg. Relax your muscles slowly and completely after each repetition. Repeat __________ times. Complete this exercise __________ times a day. Hamstring, isometric  Lie on your back on a firm surface. Bend your left / right knee about __________ degrees. Dig your left / right heel into the surface as if you are trying to pull it toward your buttocks. Tighten the muscles in the back of your thighs (hamstring) to "dig" as hard as you can without increasing any pain. Hold this position for __________ seconds. Release the tension gradually and allow your muscles to relax completely for __________ seconds after each repetition. Repeat __________ times. Complete this exercise __________ times a day. Hamstring curls If told by your health care provider, do this exercise while wearing ankle weights. Begin with __________lb / kg weights. Then increase the weight by 1 lb (0.5 kg) increments. Do not wear ankle weights that are more than __________lb / kg. Lie on your abdomen with your legs straight. Bend your left / right knee as far as you can without feeling pain. Keep your hips flat against the floor. Hold this position for __________ seconds. Slowly lower your leg to the starting position. Repeat __________ times. Complete this exercise __________ times a day. Squats This exercise strengthens the muscles in front of your thigh and knee (quadriceps). Stand in front of a table, with your feet and knees pointing straight ahead. You may rest your hands on the table for balance but not for support. Slowly bend your knees and lower your hips like you  are going to sit in a chair. Keep your weight over your heels, not over your toes. Keep your lower legs upright so they are parallel with the table legs. Do not let your hips go lower than your knees. Do not bend lower than told by your health care provider. If your knee pain increases, do not bend as low. Hold the squat position for __________ seconds. Slowly push with your legs to return to standing. Do not use your hands to pull yourself to standing. Repeat __________ times. Complete this exercise __________ times a day. Wall slides This exercise strengthens the muscles in front of your thigh and knee (quadriceps). Lean your back against a smooth wall or door, and walk your feet out 18-24 inches (46-61 cm) from it. Place your feet hip-width apart. Slowly slide down the wall or door until your knees bend __________ degrees. Keep your knees over your heels, not over your toes. Keep your knees in line with your hips. Hold this position for __________ seconds. Repeat __________ times. Complete this exercise __________ times a day. Straight leg raises, side-lying This exercise strengthens the muscles that rotate  the leg at the hip and move it away from your body (hip abductors). Lie on your side with your left / right leg in the top position. Lie so your head, shoulder, knee, and hip line up. You may bend your bottom knee to help you keep your balance. Roll your hips slightly forward so your hips are stacked directly over each other and your left / right knee is facing forward. Leading with your heel, lift your top leg 4-6 inches (10-15 cm). You should feel the muscles in your outer hip lifting. Do not let your foot drift forward. Do not let your knee roll toward the ceiling. Hold this position for __________ seconds. Slowly return your leg to the starting position. Let your muscles relax completely after each repetition. Repeat __________ times. Complete this exercise __________ times a  day. Straight leg raises, prone This exercise stretches the muscles that move your hips away from the front of the pelvis (hip extensors). Lie on your abdomen on a firm surface. You can put a pillow under your hips if that is more comfortable. Tense the muscles in your buttocks and lift your left / right leg about 4-6 inches (10-15 cm). Keep your knee straight as you lift your leg. Hold this position for __________ seconds. Slowly lower your leg to the starting position. Let your leg relax completely after each repetition. Repeat __________ times. Complete this exercise __________ times a day. This information is not intended to replace advice given to you by your health care provider. Make sure you discuss any questions you have with your health care provider. Document Revised: 12/08/2020 Document Reviewed: 12/08/2020 [employer] Patient Education  2023 [employer]   Hand Exercises Hand exercises can be helpful for almost anyone. These exercises can strengthen the hands, improve flexibility and movement, and increase blood flow to the hands. These results can make work and daily tasks easier. Hand exercises can be especially helpful for people who have joint pain from arthritis or have nerve damage from overuse (carpal tunnel syndrome). These exercises can also help people who have injured a hand. Exercises Most of these hand exercises are gentle stretching and motion exercises. It is usually safe to do them often throughout the day. Warming up your hands before exercise may help to reduce stiffness. You can do this with gentle massage or by placing your hands in warm water for 10-15 minutes. It is normal to feel some stretching, pulling, tightness, or mild discomfort as you begin new exercises. This will gradually improve. Stop an exercise right away if you feel sudden, severe pain or your pain gets worse. Ask your health care provider which exercises are best for you. Knuckle bend or "claw"  fist  Stand or sit with your arm, hand, and all five fingers pointed straight up. Make sure to keep your wrist straight during the exercise. Gently bend your fingers down toward your palm until the tips of your fingers are touching the top of your palm. Keep your big knuckle straight and just bend the small knuckles in your fingers. Hold this position for __________ seconds. Straighten (extend) your fingers back to the starting position. Repeat this exercise 5-10 times with each hand. Full finger fist  Stand or sit with your arm, hand, and all five fingers pointed straight up. Make sure to keep your wrist straight during the exercise. Gently bend your fingers into your palm until the tips of your fingers are touching the middle of your palm. Hold this position  for __________ seconds. Extend your fingers back to the starting position, stretching every joint fully. Repeat this exercise 5-10 times with each hand. Straight fist Stand or sit with your arm, hand, and all five fingers pointed straight up. Make sure to keep your wrist straight during the exercise. Gently bend your fingers at the big knuckle, where your fingers meet your hand, and the middle knuckle. Keep the knuckle at the tips of your fingers straight and try to touch the bottom of your palm. Hold this position for __________ seconds. Extend your fingers back to the starting position, stretching every joint fully. Repeat this exercise 5-10 times with each hand. Tabletop  Stand or sit with your arm, hand, and all five fingers pointed straight up. Make sure to keep your wrist straight during the exercise. Gently bend your fingers at the big knuckle, where your fingers meet your hand, as far down as you can while keeping the small knuckles in your fingers straight. Think of forming a tabletop with your fingers. Hold this position for __________ seconds. Extend your fingers back to the starting position, stretching every joint  fully. Repeat this exercise 5-10 times with each hand. Finger spread  Place your hand flat on a table with your palm facing down. Make sure your wrist stays straight as you do this exercise. Spread your fingers and thumb apart from each other as far as you can until you feel a gentle stretch. Hold this position for __________ seconds. Bring your fingers and thumb tight together again. Hold this position for __________ seconds. Repeat this exercise 5-10 times with each hand. Making circles  Stand or sit with your arm, hand, and all five fingers pointed straight up. Make sure to keep your wrist straight during the exercise. Make a circle by touching the tip of your thumb to the tip of your index finger. Hold for __________ seconds. Then open your hand wide. Repeat this motion with your thumb and each finger on your hand. Repeat this exercise 5-10 times with each hand. Thumb motion  Sit with your forearm resting on a table and your wrist straight. Your thumb should be facing up toward the ceiling. Keep your fingers relaxed as you move your thumb. Lift your thumb up as high as you can toward the ceiling. Hold for __________ seconds. Bend your thumb across your palm as far as you can, reaching the tip of your thumb for the small finger (pinkie) side of your palm. Hold for __________ seconds. Repeat this exercise 5-10 times with each hand. Grip strengthening  Hold a stress ball or other soft ball in the middle of your hand. Slowly increase the pressure, squeezing the ball as much as you can without causing pain. Think of bringing the tips of your fingers into the middle of your palm. All of your finger joints should bend when doing this exercise. Hold your squeeze for __________ seconds, then relax. Repeat this exercise 5-10 times with each hand. Contact a health care provider if: Your hand pain or discomfort gets much worse when you do an exercise. Your hand pain or discomfort does not  improve within 2 hours after you exercise. If you have any of these problems, stop doing these exercises right away. Do not do them again unless your health care provider says that you can. Get help right away if: You develop sudden, severe hand pain or swelling. If this happens, stop doing these exercises right away. Do not do them again unless your health  care provider says that you can. This information is not intended to replace advice given to you by your health care provider. Make sure you discuss any questions you have with your health care provider. Document Revised: 07/09/2020 Document Reviewed: 07/16/2020 [employer] Patient Education  2023 [employer].

## 2022-08-30 NOTE — Progress Notes (Signed)
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  No erosive changes noted.  Please notify the patient. We can schedule an ultrasound of both hands in the future if her symptoms persist or worsen.

## 2022-08-31 ENCOUNTER — Ambulatory Visit: Payer: 59 | Admitting: Physician Assistant

## 2022-08-31 DIAGNOSIS — D869 Sarcoidosis, unspecified: Secondary | ICD-10-CM

## 2022-08-31 DIAGNOSIS — M19042 Primary osteoarthritis, left hand: Secondary | ICD-10-CM

## 2022-08-31 DIAGNOSIS — G4733 Obstructive sleep apnea (adult) (pediatric): Secondary | ICD-10-CM

## 2022-08-31 DIAGNOSIS — R768 Other specified abnormal immunological findings in serum: Secondary | ICD-10-CM

## 2022-08-31 DIAGNOSIS — I10 Essential (primary) hypertension: Secondary | ICD-10-CM

## 2022-08-31 DIAGNOSIS — I2699 Other pulmonary embolism without acute cor pulmonale: Secondary | ICD-10-CM

## 2022-08-31 DIAGNOSIS — G4719 Other hypersomnia: Secondary | ICD-10-CM

## 2022-08-31 DIAGNOSIS — E559 Vitamin D deficiency, unspecified: Secondary | ICD-10-CM

## 2022-08-31 DIAGNOSIS — N1831 Chronic kidney disease, stage 3a: Secondary | ICD-10-CM

## 2022-08-31 DIAGNOSIS — E782 Mixed hyperlipidemia: Secondary | ICD-10-CM

## 2022-08-31 DIAGNOSIS — J479 Bronchiectasis, uncomplicated: Secondary | ICD-10-CM

## 2022-08-31 DIAGNOSIS — M7061 Trochanteric bursitis, right hip: Secondary | ICD-10-CM

## 2022-08-31 DIAGNOSIS — G8929 Other chronic pain: Secondary | ICD-10-CM

## 2022-08-31 DIAGNOSIS — M25461 Effusion, right knee: Secondary | ICD-10-CM

## 2022-08-31 DIAGNOSIS — M7062 Trochanteric bursitis, left hip: Secondary | ICD-10-CM

## 2022-08-31 DIAGNOSIS — I272 Pulmonary hypertension, unspecified: Secondary | ICD-10-CM

## 2022-08-31 DIAGNOSIS — N2581 Secondary hyperparathyroidism of renal origin: Secondary | ICD-10-CM

## 2022-08-31 LAB — URINALYSIS, ROUTINE W REFLEX MICROSCOPIC
Nitrite: NEGATIVE
Specific Gravity, Urine: 1.029 (ref 1.001–1.035)

## 2022-08-31 LAB — COMPLETE METABOLIC PANEL WITH GFR
ALT: 13 U/L (ref 6–29)
Creat: 1.27 mg/dL — ABNORMAL HIGH (ref 0.50–1.03)
Glucose, Bld: 97 mg/dL (ref 65–99)
Total Bilirubin: 0.4 mg/dL (ref 0.2–1.2)
Total Protein: 6.9 g/dL (ref 6.1–8.1)

## 2022-08-31 LAB — C3 AND C4: C4 Complement: 45 mg/dL (ref 15–57)

## 2022-08-31 LAB — PROTEIN / CREATININE RATIO, URINE
Protein/Creatinine Ratio: 0.05 mg/mg creat (ref 0.024–0.184)
Total Protein, Urine: 17 mg/dL (ref 5–24)

## 2022-09-01 LAB — COMPLETE METABOLIC PANEL WITH GFR
AG Ratio: 1.4 (calc) (ref 1.0–2.5)
AST: 18 U/L (ref 10–35)
Albumin: 4 g/dL (ref 3.6–5.1)
Alkaline phosphatase (APISO): 104 U/L (ref 37–153)
BUN/Creatinine Ratio: 12 (calc) (ref 6–22)
BUN: 15 mg/dL (ref 7–25)
CO2: 28 mmol/L (ref 20–32)
Calcium: 9.6 mg/dL (ref 8.6–10.4)
Chloride: 106 mmol/L (ref 98–110)
Globulin: 2.9 g/dL (calc) (ref 1.9–3.7)
Potassium: 4.3 mmol/L (ref 3.5–5.3)
Sodium: 141 mmol/L (ref 135–146)
eGFR: 51 mL/min/{1.73_m2} — ABNORMAL LOW (ref >=60)

## 2022-09-01 LAB — ANTI-DNA ANTIBODY, DOUBLE-STRANDED: ds DNA Ab: <1 IU/mL

## 2022-09-01 LAB — URINALYSIS, ROUTINE W REFLEX MICROSCOPIC
Bilirubin Urine: NEGATIVE
Glucose, UA: NEGATIVE
Hgb urine dipstick: NEGATIVE
Leukocytes,Ua: NEGATIVE
Protein, ur: NEGATIVE
pH: 5.5 (ref 5.0–8.0)

## 2022-09-01 LAB — SEDIMENTATION RATE: Sed Rate: 36 mm/h — ABNORMAL HIGH (ref 0–30)

## 2022-09-01 LAB — RHEUMATOID FACTOR: Rheumatoid fact SerPl-aCnc: <10 IU/mL (ref <14)

## 2022-09-01 LAB — CYCLIC CITRUL PEPTIDE ANTIBODY, IGG: Cyclic Citrullin Peptide Ab: <16 UNITS

## 2022-09-01 LAB — ANGIOTENSIN CONVERTING ENZYME: Angiotensin-Converting Enzyme: 130 U/L — ABNORMAL HIGH (ref 9–67)

## 2022-09-01 LAB — PROTEIN / CREATININE RATIO, URINE
Creatinine, Urine: 341 mg/dL — ABNORMAL HIGH (ref 20–275)
Protein/Creat Ratio: 50 mg/g creat (ref 24–184)

## 2022-09-01 LAB — ANTI-NUCLEAR AB-TITER (ANA TITER)
ANA TITER: 1:160 {titer} — ABNORMAL HIGH
ANA Titer 1: 1:80 {titer} — ABNORMAL HIGH

## 2022-09-01 LAB — C-REACTIVE PROTEIN: CRP: 9.1 mg/L — ABNORMAL HIGH (ref <8.0)

## 2022-09-01 LAB — ANA: Anti Nuclear Antibody (ANA): POSITIVE — AB

## 2022-09-01 LAB — C3 AND C4: C3 Complement: 173 mg/dL (ref 83–193)

## 2022-09-01 NOTE — Progress Notes (Signed)
ACE is elevated.  ESR and CRP elevated-indicating inflammation.   Creatinine remains elevated-1.27 and GFR is low-51.  Avoid the use of NSAIDs.  Trace ketones in urine. No proteinuria.  RF negative. Complements WNL.  dsDNA is negative

## 2022-09-02 ENCOUNTER — Ambulatory Visit (INDEPENDENT_AMBULATORY_CARE_PROVIDER_SITE_OTHER): Payer: BC Managed Care – PPO | Admitting: Family Medicine

## 2022-09-02 ENCOUNTER — Encounter: Payer: Self-pay | Admitting: Family Medicine

## 2022-09-02 VITALS — BP 157/91 | HR 58 | Ht 65.0 in | Wt 219.1 lb

## 2022-09-02 DIAGNOSIS — E0789 Other specified disorders of thyroid: Secondary | ICD-10-CM

## 2022-09-02 DIAGNOSIS — Z0001 Encounter for general adult medical examination with abnormal findings: Secondary | ICD-10-CM | POA: Diagnosis not present

## 2022-09-02 DIAGNOSIS — Z23 Encounter for immunization: Secondary | ICD-10-CM | POA: Diagnosis not present

## 2022-09-02 DIAGNOSIS — R7301 Impaired fasting glucose: Secondary | ICD-10-CM

## 2022-09-02 DIAGNOSIS — E782 Mixed hyperlipidemia: Secondary | ICD-10-CM | POA: Diagnosis not present

## 2022-09-02 DIAGNOSIS — Z1159 Encounter for screening for other viral diseases: Secondary | ICD-10-CM | POA: Diagnosis not present

## 2022-09-02 DIAGNOSIS — E559 Vitamin D deficiency, unspecified: Secondary | ICD-10-CM | POA: Diagnosis not present

## 2022-09-02 DIAGNOSIS — I10 Essential (primary) hypertension: Secondary | ICD-10-CM | POA: Diagnosis not present

## 2022-09-02 DIAGNOSIS — Z1231 Encounter for screening mammogram for malignant neoplasm of breast: Secondary | ICD-10-CM

## 2022-09-02 NOTE — Assessment & Plan Note (Signed)
Elevated BP in the clinic Reports not taking my medications today and yesterday Asymptomatic Encouraged compliance with treatment regimen as prescribed Will monitor BP at her next visit

## 2022-09-02 NOTE — Patient Instructions (Addendum)
I appreciate the opportunity to provide care to you today!    Follow up:  3 months and 1 year for CPE  Labs: please stop by the lab today to get your blood drawn (CBC, CMP, TSH, Lipid profile, HgA1c, Vit D)   Schedule mammogram for 10/2022     Encouraged to Exercise: If you are able: 30 -60 minutes a day ,4 days a week, or 150 minutes a week. The longer the better. Combine stretch, strength, and aerobic activities Encourage to eat whole Food, Plant Predominant Nutrition is highly recommended: Eat Plenty of vegetables, Mushrooms, fruits, Legumes, Whole Grains, Nuts, seeds in lieu of processed meats, processed snacks/pastries red meat, poultry, eggs.      Please continue to a heart-healthy diet and increase your physical activities. Try to exercise for at least five days a week.      It was a pleasure to see you and I look forward to continuing to work together on your health and well-being. Please do not hesitate to call the office if you need care or have questions about your care.   Have a wonderful day and week. With Gratitude, Gilmore Laroche MSN, FNP-BC

## 2022-09-02 NOTE — Progress Notes (Signed)
Complete physical exam  Patient: Hailey Ross   DOB: July 25, 1970   52 y.o. Female  MRN: [id number]  Subjective:    Chief Complaint  Patient presents with   Annual Exam    Cpe today    MEY SULAK is a 52 y.o. female who presents today for a complete physical exam. She reports consuming a general diet. The patient does not participate in regular exercise at present. She generally feels well. She reports sleeping well. She does not have additional problems to discuss today.    Most recent fall risk assessment:    09/02/2022    8:20 AM  Fall Risk   Falls in the past year? 0  Number falls in past yr: 0  Injury with Fall? 0  Risk for fall due to : No Fall Risks  Follow up Falls evaluation completed     Most recent depression screenings:    09/02/2022    8:20 AM 12/17/2021    8:43 AM  PHQ 2/9 Scores  PHQ - 2 Score 0 0  PHQ- 9 Score 0     Dental: No current dental problems and Last dental visit: 08/22/2022  Patient Active Problem List   Diagnosis Date Noted   Upper abdominal pain 04/29/2022   Early satiety 04/29/2022   Acute bronchitis 03/21/2022   Immunization due 12/17/2021   Need for immunization against influenza 12/17/2021   Fatigue 12/17/2021   Encounter for annual general medical examination with abnormal findings in adult 08/30/2021   CAP (community acquired pneumonia) 06/16/2021   Acute midline low back pain without sciatica 04/22/2021   Left lower quadrant abdominal pain 04/20/2021   Atypical chest pain 01/26/2021   Synovitis of left knee 01/19/2021   GERD (gastroesophageal reflux disease) 12/29/2020   Abdominal pain, epigastric 12/28/2020   Primary osteoarthritis of left knee 10/27/2020   Grief at loss of child 06/09/2020   Insomnia 06/09/2020   Right ankle swelling 02/05/2020   Encounter for examination following treatment at hospital 01/28/2020   Palpitations 01/28/2020   Menorrhagia with regular cycle 10/15/2019   OSA (obstructive sleep  apnea) 10/08/2019   Right knee pain 08/29/2019   Positive ANA (antinuclear antibody) 08/29/2019   Essential hypertension 07/04/2019   Pain and swelling of right knee 07/04/2019   Hyperlipidemia 07/04/2019   Stage 2 chronic kidney disease 07/04/2019   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   Encounter for support and coordination of transition of care 07/04/2019   Daytime hypersomnolence 07/02/2019   Pulmonary hypertension (HCC) 07/02/2019   CKD (chronic kidney disease)    Bilateral pulmonary embolism (HCC) 06/18/2019   Lightheadedness 05/10/2019   Vitamin D deficiency 05/10/2019   Class 2 obesity due to excess calories with body mass index (BMI) of 37.0 to 37.9 in adult 04/16/2019   Sarcoidosis 05/04/2015   Bronchiectasis (HCC) 05/04/2015   Past Medical History:  Diagnosis Date   Acute cholecystitis 08/24/2016   Acute kidney injury superimposed on chronic kidney disease (HCC) 06/20/2019   Acute respiratory failure with hypoxia (HCC) 06/19/2019   Annual visit for general adult medical examination with abnormal findings 04/16/2019   Arthritis    right knee   Bronchiectasis (HCC)    CKD (chronic kidney disease)    Iron deficiency    Lightheadedness 05/10/2019   PE (pulmonary thromboembolism) (HCC) 06/2019   Pre-syncope 04/16/2019   Sarcoidosis    Sarcoidosis    Sleep apnea    c pap   Smoker 02/12/2014   Past Surgical  History:  Procedure Laterality Date   CHOLECYSTECTOMY N/A 08/25/2016   Procedure: LAPAROSCOPIC CHOLECYSTECTOMY;  Surgeon: Abigail Miyamoto, MD;  Location: [facility] OR;  Service: General;  Laterality: N/A;   TUBAL LIGATION  02/1993   Social History   Tobacco Use   Smoking status: Former    Packs/day: 1.00    Years: 20.00    Additional pack years: 0.00    Total pack years: 20.00    Types: Cigarettes    Quit date: 03/21/2015    Years since quitting: 7.4    Passive exposure: Past   Smokeless tobacco: Never  Vaping Use   Vaping Use: Never used  Substance Use Topics    Alcohol use: Not Currently    Comment: Rare   Drug use: Never   Social History   Socioeconomic History   Marital status: Married    Spouse name: Not on file   Number of children: 3   Years of education: Not on file   Highest education level: High school graduate  Occupational History   Occupation: Administrator, [employer] ([employer])  Tobacco Use   Smoking status: Former    Packs/day: 1.00    Years: 20.00    Additional pack years: 0.00    Total pack years: 20.00    Types: Cigarettes    Quit date: 03/21/2015    Years since quitting: 7.4    Passive exposure: Past   Smokeless tobacco: Never  Vaping Use   Vaping Use: Never used  Substance and Sexual Activity   Alcohol use: Not Currently    Comment: Rare   Drug use: Never   Sexual activity: Yes    Birth control/protection: Surgical  Other Topics Concern   Not on file  Social History Narrative   Live with partners [name]-19 years   3 children.    1-[name] (girl) 27 at home with her: 2 grandchildren in her home as well    [name]- 26 expecting   [name]-25      Enjoy: read, play games on tablet, grandkids      Diet: Veggies, does not eat a lot of fried foods, enjoys chicken and fruit   Caffeine: sodas and coffee (with sugar)   Water: Does not drink a lot at all      Wears seat beat   Does not wear sunscreen   Smoke and carbon monoxide detectors   Does not use phone while driving    Social Determinants of Health   Financial Resource Strain: Low Risk  (10/10/2018)   Overall Financial Resource Strain (CARDIA)    Difficulty of Paying Living Expenses: Not hard at all  Food Insecurity: No Food Insecurity (10/10/2018)   Hunger Vital Sign    Worried About Running Out of Food in the Last Year: Never true    Ran Out of Food in the Last Year: Never true  Transportation Needs: No Transportation Needs (10/10/2018)   PRAPARE - Administrator, Civil Service (Medical): No    Lack of Transportation (Non-Medical): No  Physical Activity:  Sufficiently Active (10/10/2018)   Exercise Vital Sign    Days of Exercise per Week: 7 days    Minutes of Exercise per Session: 60 min  Stress: No Stress Concern Present (10/10/2018)   [employer] of Occupational Health - Occupational Stress Questionnaire    Feeling of Stress : Not at all  Social Connections: Moderately Isolated (10/10/2018)   Social Connection and Isolation Panel [NHANES]    Frequency of Communication with Friends and  Family: More than three times a week    Frequency of Social Gatherings with Friends and Family: Once a week    Attends Religious Services: More than 4 times per year    Active Member of [employer] or Organizations: No    Attends Banker Meetings: Never    Marital Status: Never married  Intimate Partner Violence: Not At Risk (10/10/2018)   Humiliation, Afraid, Rape, and Kick questionnaire    Fear of Current or Ex-Partner: No    Emotionally Abused: No    Physically Abused: No    Sexually Abused: No   Family Status  Relation Name Status   Mother  Alive   Father  Alive   Sister  Alive   Sister  Alive   Sister  Alive   Sister  Alive   MGM  Deceased   MGF  Deceased   PGM  Deceased   PGF  Deceased   Son  Alive   Son  Alive   Daughter  Alive   Neg Hx  (Not Specified)   Family History  Problem Relation Age of Onset   Hypertension Mother    Cancer Mother    Colon polyps Mother    Healthy Sister    Healthy Sister    Healthy Sister    Healthy Sister    Healthy Son    Healthy Son    Healthy Daughter    Colon cancer Neg Hx    Esophageal cancer Neg Hx    Rectal cancer Neg Hx    Stomach cancer Neg Hx    No Known Allergies    Patient Care Team: Gilmore Laroche, FNP as PCP - General (Family Medicine) Wyline Mood, Dorothe Pea, MD as PCP - Cardiology (Cardiology) Doreatha Massed, MD as Consulting Physician (Hematology) Jena Gauss, Gerrit Friends, MD as Consulting Physician (Gastroenterology)   Outpatient Medications Prior to Visit  Medication  Sig   acetaminophen (TYLENOL) 500 MG tablet Take 500 mg by mouth every 4 (four) hours as needed for mild pain, moderate pain or headache.    albuterol (VENTOLIN HFA) 108 (90 Base) MCG/ACT inhaler Inhale 2 puffs into the lungs every 6 (six) hours as needed for wheezing or shortness of breath.   azelastine (ASTELIN) 0.1 % nasal spray Place 1 spray into both nostrils at bedtime. Use in each nostril as directed   benzonatate (TESSALON) 100 MG capsule Take 1 capsule (100 mg total) by mouth 3 (three) times daily as needed for cough. Do not take with alcohol or while driving or operating heavy machinery.  May cause drowsiness.   Budeson-Glycopyrrol-Formoterol (BREZTRI AEROSPHERE) 160-9-4.8 MCG/ACT AERO Inhale 2 puffs into the lungs in the morning and at bedtime.   Budeson-Glycopyrrol-Formoterol (BREZTRI AEROSPHERE) 160-9-4.8 MCG/ACT AERO Inhale 2 puffs into the lungs in the morning and at bedtime.   diltiazem (CARDIZEM) 30 MG tablet Take 1 tablet (30 mg total) by mouth 2 (two) times daily. May take an additional 30 mg tablet daily as needed for palpitations   fluticasone (FLONASE) 50 MCG/ACT nasal spray Place 1 spray into both nostrils at bedtime.   lisinopril (ZESTRIL) 5 MG tablet Take 1 tablet (5 mg total) by mouth daily.   LORazepam (ATIVAN) 0.5 MG tablet Take 0.5 mg by mouth daily as needed.   metoCLOPramide (REGLAN) 5 MG tablet Take 1 tablet (5 mg total) by mouth 3 (three) times daily. Before meals and at bedtime   olmesartan (BENICAR) 5 MG tablet Take 5 mg by mouth daily.   pantoprazole (PROTONIX)  40 MG tablet TAKE 1 TABLET(40 MG) BY MOUTH DAILY   prazosin (MINIPRESS) 1 MG capsule 1 mg if needed   XARELTO 20 MG TABS tablet TAKE 1 TABLET(20 MG) BY MOUTH DAILY WITH SUPPER   zolpidem (AMBIEN) 10 MG tablet Take 10 mg by mouth at bedtime as needed.   No facility-administered medications prior to visit.    Review of Systems  Constitutional:  Negative for chills, fever and malaise/fatigue.  HENT:   Negative for congestion and sinus pain.   Eyes:  Negative for pain, discharge and redness.  Respiratory:  Negative for cough, sputum production and shortness of breath.   Cardiovascular:  Negative for chest pain, palpitations, claudication and leg swelling.  Gastrointestinal:  Negative for diarrhea, heartburn and nausea.  Genitourinary:  Negative for flank pain and frequency.  Musculoskeletal:  Negative for back pain and joint pain.  Skin:  Negative for itching.  Neurological:  Negative for dizziness, seizures and headaches.  Endo/Heme/Allergies:  Negative for environmental allergies.  Psychiatric/Behavioral:  Negative for memory loss. The patient does not have insomnia.        Objective:    BP (!) 157/91   Pulse (!) 58   Ht 5\' 5"  (1.651 m)   Wt 219 lb 1.9 oz (99.4 kg)   LMP 04/01/2021 (Exact Date)   SpO2 90%   BMI 36.46 kg/m  BP Readings from Last 3 Encounters:  09/02/22 (!) 157/91  08/30/22 130/87  08/12/22 136/88   Wt Readings from Last 3 Encounters:  09/02/22 219 lb 1.9 oz (99.4 kg)  08/30/22 221 lb 6.4 oz (100.4 kg)  08/12/22 220 lb 12.8 oz (100.2 kg)      Physical Exam HENT:     Head: Normocephalic.     Left Ear: External ear normal.     Mouth/Throat:     Mouth: Mucous membranes are moist.  Eyes:     Extraocular Movements: Extraocular movements intact.     Pupils: Pupils are equal, round, and reactive to light.  Cardiovascular:     Rate and Rhythm: Normal rate and regular rhythm.     Heart sounds: Normal heart sounds. No murmur heard. Pulmonary:     Effort: Pulmonary effort is normal.     Breath sounds: Normal breath sounds.  Abdominal:     Palpations: Abdomen is soft.     Tenderness: There is no right CVA tenderness or left CVA tenderness.  Musculoskeletal:     Right lower leg: No edema.     Left lower leg: No edema.  Skin:    Findings: No lesion or rash.  Neurological:     Mental Status: She is alert and oriented to person, place, and time.      GCS: GCS eye subscore is 4. GCS verbal subscore is 5. GCS motor subscore is 6.     Cranial Nerves: No facial asymmetry.     Sensory: No sensory deficit.     Motor: No weakness.     Coordination: Coordination normal. Finger-Nose-Finger Test normal.     Gait: Gait normal.  Psychiatric:        Judgment: Judgment normal.     No results found for any visits on 09/02/22. Last CBC Lab Results  Component Value Date   WBC 8.4 12/20/2021   HGB 12.6 12/20/2021   HCT 40.0 12/20/2021   MCV 85.8 12/20/2021   MCH 27.0 12/20/2021   RDW 14.0 12/20/2021   PLT 278 12/20/2021   Last metabolic panel Lab Results  Component  Value Date   GLUCOSE 97 08/30/2022   NA 141 08/30/2022   K 4.3 08/30/2022   CL 106 08/30/2022   CO2 28 08/30/2022   BUN 15 08/30/2022   CREATININE 1.27 (H) 08/30/2022   EGFR 51 (L) 08/30/2022   CALCIUM 9.6 08/30/2022   PROT 6.9 08/30/2022   ALBUMIN 3.8 11/19/2021   BILITOT 0.4 08/30/2022   ALKPHOS 75 11/19/2021   AST 18 08/30/2022   ALT 13 08/30/2022   ANIONGAP 9 11/19/2021   Last lipids Lab Results  Component Value Date   CHOL 237 (H) 12/17/2021   HDL 69 12/17/2021   LDLCALC 156 (H) 12/17/2021   TRIG 70 12/17/2021   CHOLHDL 3.4 12/17/2021   Last hemoglobin A1c Lab Results  Component Value Date   HGBA1C 5.7 (H) 04/22/2019   Last thyroid functions Lab Results  Component Value Date   TSH 1.17 01/22/2021   Last vitamin D Lab Results  Component Value Date   VD25OH 36.4 12/17/2021   Last vitamin B12 and Folate Lab Results  Component Value Date   VITAMINB12 243 09/29/2021   FOLATE 8.8 09/29/2021        Assessment & Plan:    Routine Health Maintenance and Physical Exam  Immunization History  Administered Date(s) Administered   Influenza Inj Mdck Quad Pf 12/25/2019   Influenza,inj,Quad PF,6+ Mos 12/21/2018, 02/22/2021, 12/17/2021   Influenza,inj,Quad PF,6-35 Mos 01/10/2019   PFIZER(Purple Top)SARS-COV-2 Vaccination 08/14/2019, 09/10/2019,  02/07/2020   Pneumococcal Polysaccharide-23 02/12/2014   Tdap 02/12/2014   Zoster Recombinat (Shingrix) 12/17/2021    Health Maintenance  Topic Date Due   Hepatitis C Screening  Never done   COVID-19 Vaccine (4 - 2023-24 season) 12/10/2021   Zoster Vaccines- Shingrix (2 of 2) 02/11/2022   Lung Cancer Screening  08/14/2022   MAMMOGRAM  10/27/2022   INFLUENZA VACCINE  11/10/2022   PAP SMEAR-Modifier  10/27/2023   DTaP/Tdap/Td (2 - Td or Tdap) 02/13/2024   Colonoscopy  09/19/2029   HIV Screening  Completed   HPV VACCINES  Aged Out    Discussed health benefits of physical activity, and encouraged her to engage in regular exercise appropriate for her age and condition.  Encounter for annual general medical examination with abnormal findings in adult Assessment & Plan: Physical exam as documented Discussed heart-healthy diet  Encouraged to Exercise: If you are able: 30 -60 minutes a day ,4 days a week, or 150 minutes a week. The longer the better. Combine stretch, strength, and aerobic activities Encourage to eat whole Food, Plant Predominant Nutrition is highly recommended: Eat Plenty of vegetables, Mushrooms, fruits, Legumes, Whole Grains, Nuts, seeds in lieu of processed meats, processed snacks/pastries red meat, poultry, eggs.  Will f/u in 1 year for CPE      Essential hypertension Assessment & Plan: Elevated BP in the clinic Reports not taking my medications today and yesterday Asymptomatic Encouraged compliance with treatment regimen as prescribed Will monitor BP at her next visit  Orders: -     CBC with Differential/Platelet -     CMP14+EGFR  Breast cancer screening by mammogram -     3D Screening Mammogram, Left and Right  Immunization due  Encounter for hepatitis C screening test for low risk patient -     Hepatitis C antibody  Mixed hyperlipidemia -     Lipid panel  Vitamin D deficiency -     VITAMIN D 25 Hydroxy (Vit-D Deficiency, Fractures)  Other  specified disorders of thyroid -  TSH + free T4  Impaired fasting blood sugar -     Hemoglobin A1c    Return in about 1 year (around 09/02/2023) for CPE.     Gilmore Laroche, FNP

## 2022-09-02 NOTE — Assessment & Plan Note (Signed)
Physical exam as documented Discussed heart-healthy diet  Encouraged to Exercise: If you are able: 30 -60 minutes a day ,4 days a week, or 150 minutes a week. The longer the better. Combine stretch, strength, and aerobic activities Encourage to eat whole Food, Plant Predominant Nutrition is highly recommended: Eat Plenty of vegetables, Mushrooms, fruits, Legumes, Whole Grains, Nuts, seeds in lieu of processed meats, processed snacks/pastries red meat, poultry, eggs.  Will f/u in 1 year for CPE    

## 2022-09-03 LAB — CMP14+EGFR
ALT: 14 IU/L (ref 0–32)
AST: 17 IU/L (ref 0–40)
Albumin/Globulin Ratio: 1.4 (ref 1.2–2.2)
Albumin: 4.2 g/dL (ref 3.8–4.9)
Alkaline Phosphatase: 113 IU/L (ref 44–121)
BUN/Creatinine Ratio: 15 (ref 9–23)
BUN: 18 mg/dL (ref 6–24)
Bilirubin Total: 0.5 mg/dL (ref 0.0–1.2)
CO2: 23 mmol/L (ref 20–29)
Calcium: 9.4 mg/dL (ref 8.7–10.2)
Chloride: 105 mmol/L (ref 96–106)
Creatinine, Ser: 1.24 mg/dL — ABNORMAL HIGH (ref 0.57–1.00)
Globulin, Total: 2.9 g/dL (ref 1.5–4.5)
Glucose: 84 mg/dL (ref 70–99)
Potassium: 4.6 mmol/L (ref 3.5–5.2)
Sodium: 139 mmol/L (ref 134–144)
Total Protein: 7.1 g/dL (ref 6.0–8.5)
eGFR: 52 mL/min/{1.73_m2} — ABNORMAL LOW (ref >59)

## 2022-09-03 LAB — CBC WITH DIFFERENTIAL/PLATELET
Basophils Absolute: 0 10*3/uL (ref 0.0–0.2)
Basos: 0 %
EOS (ABSOLUTE): 0 10*3/uL (ref 0.0–0.4)
Eos: 0 %
Hematocrit: 41.3 % (ref 34.0–46.6)
Hemoglobin: 12.8 g/dL (ref 11.1–15.9)
Immature Grans (Abs): 0 10*3/uL (ref 0.0–0.1)
Immature Granulocytes: 0 %
Lymphocytes Absolute: 3 10*3/uL (ref 0.7–3.1)
Lymphs: 30 %
MCH: 25.7 pg — ABNORMAL LOW (ref 26.6–33.0)
MCHC: 31 g/dL — ABNORMAL LOW (ref 31.5–35.7)
MCV: 83 fL (ref 79–97)
Monocytes Absolute: 0.5 10*3/uL (ref 0.1–0.9)
Monocytes: 5 %
Neutrophils Absolute: 6.4 10*3/uL (ref 1.4–7.0)
Neutrophils: 65 %
Platelets: 353 10*3/uL (ref 150–450)
RBC: 4.99 x10E6/uL (ref 3.77–5.28)
RDW: 13.3 % (ref 11.7–15.4)
WBC: 10 10*3/uL (ref 3.4–10.8)

## 2022-09-03 LAB — LIPID PANEL
Chol/HDL Ratio: 3.1 ratio (ref 0.0–4.4)
Cholesterol, Total: 221 mg/dL — ABNORMAL HIGH (ref 100–199)
HDL: 72 mg/dL (ref >39)
LDL Chol Calc (NIH): 138 mg/dL — ABNORMAL HIGH (ref 0–99)
Triglycerides: 65 mg/dL (ref 0–149)
VLDL Cholesterol Cal: 11 mg/dL (ref 5–40)

## 2022-09-03 LAB — TSH+FREE T4
Free T4: 1.03 ng/dL (ref 0.82–1.77)
TSH: 1.99 u[IU]/mL (ref 0.450–4.500)

## 2022-09-03 LAB — HEMOGLOBIN A1C
Est. average glucose Bld gHb Est-mCnc: 131 mg/dL
Hgb A1c MFr Bld: 6.2 % — ABNORMAL HIGH (ref 4.8–5.6)

## 2022-09-03 LAB — VITAMIN D 25 HYDROXY (VIT D DEFICIENCY, FRACTURES): Vit D, 25-Hydroxy: 30.3 ng/mL (ref 30.0–100.0)

## 2022-09-03 LAB — HEPATITIS C ANTIBODY: Hep C Virus Ab: NONREACTIVE

## 2022-09-06 ENCOUNTER — Other Ambulatory Visit: Payer: Self-pay | Admitting: Family Medicine

## 2022-09-06 DIAGNOSIS — E785 Hyperlipidemia, unspecified: Secondary | ICD-10-CM

## 2022-09-06 MED ORDER — ROSUVASTATIN CALCIUM 10 MG PO TABS
10.0000 mg | ORAL_TABLET | Freq: Every day | ORAL | 3 refills | Status: DC
Start: 2022-09-06 — End: 2022-10-18

## 2022-09-06 NOTE — Progress Notes (Signed)
ANA remains positive.  Anti-CCP negative.

## 2022-09-06 NOTE — Progress Notes (Signed)
The 10-year ASCVD risk score (Arnett DK, et al., 2019) is: 6.8%   Values used to calculate the score:     Age: 52 years     Sex: Female     Is Non-Hispanic African American: Yes     Diabetic: No     Tobacco smoker: No     Systolic Blood Pressure: 157 mmHg     Is BP treated: Yes     HDL Cholesterol: 72 mg/dL     Total Cholesterol: 221 mg/dL

## 2022-09-06 NOTE — Progress Notes (Signed)
Your cholesterol levels are elevated. I want your LDL to be less than 100. I recommend avoiding simple carbohydrates including cakes, sweet desserts, ice cream, soda (diet or regular), sweet tea, candies, chips, cookies, store-bought juices, alcohol in excess of 1-2 drinks a day, lemonade, artificial sweeteners, donuts, coffee creamers, and sugar-free products.  I recommend avoiding greasy, fatty foods with increased physical activity. A prescription for rosuvastatin 10 mg has been sent to your pharmacy to help decrease your cholesterol levels. You are hgA1c is 6.2. This means that you are prediabetic. I recommend decreasing your intake of foods high in sugar and increasing your consumption of water, at least 64 ounces daily.

## 2022-09-12 DIAGNOSIS — F320 Major depressive disorder, single episode, mild: Secondary | ICD-10-CM | POA: Diagnosis not present

## 2022-09-12 DIAGNOSIS — F411 Generalized anxiety disorder: Secondary | ICD-10-CM | POA: Diagnosis not present

## 2022-09-12 DIAGNOSIS — F4311 Post-traumatic stress disorder, acute: Secondary | ICD-10-CM | POA: Diagnosis not present

## 2022-09-13 ENCOUNTER — Ambulatory Visit: Payer: BC Managed Care – PPO | Admitting: Internal Medicine

## 2022-09-15 ENCOUNTER — Ambulatory Visit
Admission: RE | Admit: 2022-09-15 | Discharge: 2022-09-15 | Disposition: A | Discharge status: Home / Self Care | Payer: BC Managed Care – PPO | Source: Ambulatory Visit | Attending: Adult Health | Admitting: Adult Health

## 2022-09-15 DIAGNOSIS — D860 Sarcoidosis of lung: Secondary | ICD-10-CM

## 2022-09-15 DIAGNOSIS — I77810 Thoracic aortic ectasia: Secondary | ICD-10-CM | POA: Diagnosis not present

## 2022-09-20 ENCOUNTER — Telehealth: Payer: Self-pay

## 2022-09-20 DIAGNOSIS — D869 Sarcoidosis, unspecified: Secondary | ICD-10-CM | POA: Diagnosis not present

## 2022-09-20 DIAGNOSIS — N1831 Chronic kidney disease, stage 3a: Secondary | ICD-10-CM | POA: Diagnosis not present

## 2022-09-20 DIAGNOSIS — E559 Vitamin D deficiency, unspecified: Secondary | ICD-10-CM | POA: Diagnosis not present

## 2022-09-20 DIAGNOSIS — I129 Hypertensive chronic kidney disease with stage 1 through stage 4 chronic kidney disease, or unspecified chronic kidney disease: Secondary | ICD-10-CM | POA: Diagnosis not present

## 2022-09-20 NOTE — Telephone Encounter (Signed)
ATC X1 LVM for patient to bring SD card from CPAP machine to apt tomorrow

## 2022-09-21 ENCOUNTER — Ambulatory Visit: Payer: Managed Care, Other (non HMO) | Admitting: Adult Health

## 2022-09-22 ENCOUNTER — Encounter: Payer: Self-pay | Admitting: Family Medicine

## 2022-09-23 NOTE — Telephone Encounter (Signed)
Pt returning a missed call

## 2022-09-23 NOTE — Telephone Encounter (Signed)
Call made in error. NFN

## 2022-09-26 ENCOUNTER — Ambulatory Visit (INDEPENDENT_AMBULATORY_CARE_PROVIDER_SITE_OTHER): Payer: BC Managed Care – PPO | Admitting: Pulmonary Disease

## 2022-09-26 ENCOUNTER — Encounter: Payer: Self-pay | Admitting: Pulmonary Disease

## 2022-09-26 ENCOUNTER — Encounter (HOSPITAL_BASED_OUTPATIENT_CLINIC_OR_DEPARTMENT_OTHER): Payer: Self-pay | Admitting: Pulmonary Disease

## 2022-09-26 VITALS — BP 118/62 | HR 55 | Ht 65.0 in | Wt 213.8 lb

## 2022-09-26 DIAGNOSIS — G4733 Obstructive sleep apnea (adult) (pediatric): Secondary | ICD-10-CM

## 2022-09-26 DIAGNOSIS — D860 Sarcoidosis of lung: Secondary | ICD-10-CM

## 2022-09-26 DIAGNOSIS — J479 Bronchiectasis, uncomplicated: Secondary | ICD-10-CM

## 2022-09-26 MED ORDER — PREDNISONE 10 MG PO TABS: ORAL_TABLET | ORAL | 0 refills | Status: DC

## 2022-09-26 NOTE — Patient Instructions (Addendum)
Prednisone 20 mg daily for 2 weeks, then 10 mg daily until next appointment  Follow up in 3 weeks

## 2022-09-26 NOTE — Progress Notes (Signed)
[facility]  Chief Complaint  Patient presents with   Follow-up    F/U after CT scan that she completed last week. Denies any new concerns.     Constitutional:  BP 118/62   Pulse (!) 55   Ht 5\' 5"  (1.651 m)   Wt 213 lb 13.5 oz (97 kg)   LMP 04/01/2021 (Exact Date)   SpO2 97% Comment: on RA  BMI 35.59 kg/m   Past Medical History:  CKD, Anemia, PE March 2021  Past Surgical History:  Her  has a past surgical history that includes Tubal ligation (02/1993) and Cholecystectomy (N/A, 08/25/2016).  Brief Summary:  Hailey Ross is a 52 y.o. female former smoker with pulmonary sarcoidosis, obstructive sleep apnea, and allergic rhinitis.      Subjective:   CT chest showed changes of fibrocavitary sarcoidosis but also areas of active inflammation.  She gets winded more easily with activity.  No issues using breztri.  Not having chest congestion or wheeze.  Sleeping okay.  Physical Exam:   Appearance - well kempt   ENMT - no sinus tenderness, no oral exudate, no LAN, Mallampati 3 airway, no stridor, wears dentures, deviated septum  Respiratory - equal breath sounds bilaterally, no wheezing or rales  CV - s1s2 regular rate and rhythm, no murmurs  Ext - no clubbing, no edema  Skin - no rashes  Psych - normal mood and affect   Pulmonary testing:  Labs 04/21/15 >> HIV negative, Quantiferon gold negative, ACE 195, ANA, RF negative, ESR 32 PFT 05/26/15 >> FEV1 1.67 (66%), FEV1% 88, TLC 2/78 (53%), DLCO 36% PFT 09/26/19 >> FEV1 1.33 (54%), FEV1% 91, TLC 3.17 (61%), DLCO 45% PFT 06/20/22 >> FEV1 1.04 (36%), FEV1% 83, TLC 3.23 (62%), DLCO 32%  Chest Imaging:  CT chest 03/18/15 >> biapical thickening with traction BTX, BTX RML and lingula, subcarinal LAN 1.7 cm, patchy increased interstitial markings HRCT chest 03/07/18 >> borderline LAN, patchy GGO, septal thickening, architectural distortion, cylindrical and varicose BTX CT  angio chest 06/18/19 >> peripheral and subsegmental PEs, bulky LAN CT angio chest 07/15/19 >> apical fibrosis, no change in LAN CT angio chest 12/23/20 >> apical fibrosis with honeycombing and traction BTX HRCT chest 09/21/22 >> ascending thoracic aorta 4.5 cm; patchy GGO, septal thickening, subpleural reticulation, BTX and scattered honeycombing (chronic sarcoid with mild progression)  Sleep Tests:  HST 07/23/19 >> AHI 9.5, SpO2 low 74% Auto CPAP 07/05/21 to 08/03/21 >> used on 13 of 30 nights with average 3 hrs 45 minutes.  Average AHI 0.1 with median CPAP 8 and 95 th percentile CPAP 12 cm H2O  Cardiac Tests:  Echo 10/19/20 >> EF 55 to 60%, RVSP 35.5 mmHg  Social History:  She  reports that she quit smoking about 7 years ago. Her smoking use included cigarettes. She has a 20.00 pack-year smoking history. She has been exposed to tobacco smoke. She has never used smokeless tobacco. She reports that she does not currently use alcohol. She reports that she does not use drugs.  Family History:  Her family history includes Cancer in her mother; Colon polyps in her mother; Healthy in her daughter, sister, sister, sister, sister, son, and son; Hypertension in her mother.     Assessment/Plan:   Fibrotic pulmonary sarcoidosis. - has progressive diffusion defect that is now severe and CT chest shows areas of GGO - will try her on prednisone for next few weeks, and if she has some improvement then  will plan to transition to methotrexate or azathioprine - continue breztri two puffs bid - letter written for her disability application  Obstructive sleep apnea. - she is compliant with therapy and reports benefit from CPAP - uses [employer] for her DME - continue auto CPAP 5 to 15 cm H2O  Upper airway cough with post nasal drip with deviated nasal septum. 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 - remains  on life long xarelto - followed by Dr. Ellin Saba with Hematology/Oncology  Positive ANA. - has progressive joint stiffness - follow up with Dr. Pollyann Savoy with rheumatology  CKD 2. - followed by Dr. Wolfgang Phoenix with nephrology  Time Spent Involved in Patient Care on Day of Examination:  38 minutes  Follow up:   Patient Instructions  Prednisone 20 mg daily for 2 weeks, then 10 mg daily until next appointment  Follow up in 3 weeks  Medication List:   Allergies as of 09/26/2022   No Known Allergies      Medication List        Accurate as of September 26, 2022 10:46 AM. If you have any questions, ask your nurse or doctor.          acetaminophen 500 MG tablet Commonly known as: TYLENOL Take 500 mg by mouth every 4 (four) hours as needed for mild pain, moderate pain or headache.   albuterol 108 (90 Base) MCG/ACT inhaler Commonly known as: VENTOLIN HFA Inhale 2 puffs into the lungs every 6 (six) hours as needed for wheezing or shortness of breath.   azelastine 0.1 % nasal spray Commonly known as: ASTELIN Place 1 spray into both nostrils at bedtime. Use in each nostril as directed   benzonatate 100 MG capsule Commonly known as: TESSALON Take 1 capsule (100 mg total) by mouth 3 (three) times daily as needed for cough. Do not take with alcohol or while driving or operating heavy machinery.  May cause drowsiness.   Breztri Aerosphere 160-9-4.8 MCG/ACT Aero Generic drug: Budeson-Glycopyrrol-Formoterol Inhale 2 puffs into the lungs in the morning and at bedtime. What changed: Another medication with the same name was removed. Continue taking this medication, and follow the directions you see here. Changed by: Coralyn Helling, MD   diltiazem 30 MG tablet Commonly known as: CARDIZEM Take 1 tablet (30 mg total) by mouth 2 (two) times daily. May take an additional 30 mg tablet daily as needed for palpitations   fluticasone 50 MCG/ACT nasal spray Commonly known as:  FLONASE Place 1 spray into both nostrils at bedtime.   lisinopril 5 MG tablet Commonly known as: ZESTRIL Take 1 tablet (5 mg total) by mouth daily.   LORazepam 0.5 MG tablet Commonly known as: ATIVAN Take 0.5 mg by mouth daily as needed.   metoCLOPramide 5 MG tablet Commonly known as: Reglan Take 1 tablet (5 mg total) by mouth 3 (three) times daily. Before meals and at bedtime   olmesartan 5 MG tablet Commonly known as: BENICAR Take 5 mg by mouth daily.   pantoprazole 40 MG tablet Commonly known as: PROTONIX TAKE 1 TABLET(40 MG) BY MOUTH DAILY   prazosin 1 MG capsule Commonly known as: MINIPRESS 1 mg if needed   predniSONE 10 MG tablet Commonly known as: DELTASONE Take 2 tablets (20 mg total) by mouth daily with breakfast for 14 days, THEN 1 tablet (10 mg total) daily with breakfast for 14 days. Start taking on: September 26, 2022 Started by: Coralyn Helling, MD   rosuvastatin 10 MG tablet Commonly known as: Crestor Take 1 tablet (10 mg total) by mouth daily.   Xarelto 20 MG Tabs tablet Generic drug: rivaroxaban TAKE 1 TABLET(20 MG) BY MOUTH DAILY WITH SUPPER   zolpidem 10 MG tablet Commonly known as: AMBIEN Take 10 mg by mouth at bedtime as needed.        Signature:  Coralyn Helling, MD [facility] Pager - [phone] 09/26/2022, 10:46 AM

## 2022-10-03 ENCOUNTER — Ambulatory Visit: Payer: BC Managed Care – PPO | Attending: Nurse Practitioner | Admitting: Nurse Practitioner

## 2022-10-03 ENCOUNTER — Other Ambulatory Visit: Payer: Self-pay | Admitting: Nurse Practitioner

## 2022-10-03 VITALS — BP 118/72 | HR 51 | Ht 65.0 in | Wt 214.6 lb

## 2022-10-03 DIAGNOSIS — N1831 Chronic kidney disease, stage 3a: Secondary | ICD-10-CM

## 2022-10-03 DIAGNOSIS — Z87898 Personal history of other specified conditions: Secondary | ICD-10-CM | POA: Diagnosis not present

## 2022-10-03 DIAGNOSIS — G4733 Obstructive sleep apnea (adult) (pediatric): Secondary | ICD-10-CM

## 2022-10-03 DIAGNOSIS — R0789 Other chest pain: Secondary | ICD-10-CM

## 2022-10-03 DIAGNOSIS — R001 Bradycardia, unspecified: Secondary | ICD-10-CM

## 2022-10-03 DIAGNOSIS — I7121 Aneurysm of the ascending aorta, without rupture: Secondary | ICD-10-CM

## 2022-10-03 DIAGNOSIS — Z86711 Personal history of pulmonary embolism: Secondary | ICD-10-CM

## 2022-10-03 DIAGNOSIS — J479 Bronchiectasis, uncomplicated: Secondary | ICD-10-CM

## 2022-10-03 DIAGNOSIS — R42 Dizziness and giddiness: Secondary | ICD-10-CM

## 2022-10-03 DIAGNOSIS — E785 Hyperlipidemia, unspecified: Secondary | ICD-10-CM

## 2022-10-03 DIAGNOSIS — I272 Pulmonary hypertension, unspecified: Secondary | ICD-10-CM

## 2022-10-03 DIAGNOSIS — D860 Sarcoidosis of lung: Secondary | ICD-10-CM

## 2022-10-03 MED ORDER — DILTIAZEM HCL 30 MG PO TABS: 30.0000 mg | ORAL_TABLET | Freq: Every day | ORAL | 1 refills | Status: DC

## 2022-10-03 NOTE — Patient Instructions (Addendum)
Medication Instructions:  .Your physician has recommended you make the following change in your medication:  Reduce diltiazem to 1 30 Mg tablet Daily as needed for palpitations Continue all other medications as prescribed  Labwork: none  Testing/Procedures: none  Follow-Up: Your physician recommends that you schedule a follow-up appointment in: 6 months with Philis Nettle or [facility]  Any Other Special Instructions Will Be Listed Below (If Applicable).  If you need a refill on your cardiac medications before your next appointment, please call your pharmacy.

## 2022-10-03 NOTE — Progress Notes (Signed)
Office Visit    Patient Name: Hailey Ross Date of Encounter: 10/03/2022  PCP:  Gilmore Laroche, FNP   [facility]  Cardiologist:  Dina Rich, MD  Advanced Practice Provider:  No care team member to display Electrophysiologist:  None    Chief Complaint    Hailey Ross is a 52 y.o. female with a hx of palpitations, sarcoidosis/bronchiectasis, CKD stage 3a (sees Dr. Wolfgang Phoenix), thoracic ascending aortic aneurysm (noted on CT scan 09/2022), chronic hx of lightheadedness, pulmonary HTN, OSA on CPAP, history of unprovoked PE, and hyperlipidemia, who presents today for scheduled follow-up.    Past Medical History    Past Medical History:  Diagnosis Date   Acute cholecystitis 08/24/2016   Acute kidney injury superimposed on chronic kidney disease (HCC) 06/20/2019   Acute respiratory failure with hypoxia (HCC) 06/19/2019   Annual visit for general adult medical examination with abnormal findings 04/16/2019   Arthritis    right knee   Bronchiectasis (HCC)    CKD (chronic kidney disease)    Iron deficiency    Lightheadedness 05/10/2019   PE (pulmonary thromboembolism) (HCC) 06/2019   Pre-syncope 04/16/2019   Sarcoidosis    Sarcoidosis    Sleep apnea    c pap   Smoker 02/12/2014   Past Surgical History:  Procedure Laterality Date   CHOLECYSTECTOMY N/A 08/25/2016   Procedure: LAPAROSCOPIC CHOLECYSTECTOMY;  Surgeon: Abigail Miyamoto, MD;  Location: [facility] OR;  Service: General;  Laterality: N/A;   TUBAL LIGATION  02/1993    Allergies  No Known Allergies  History of Present Illness    Hailey Ross is a very pleasant 52 y.o. female with a PMH as mentioned above.  30-day event monitor in 2021 revealed no significant arrhythmias, was started on diltiazem 30 mg twice daily for symptoms of palpitations.  History of bilateral PEs found in 2021, repeat CT PE 1 month later showed resolution of PEs.  Repeat monitor in 2022 revealed rare PACs, rare  episodes of SVT up to 12 beats, rare PVCs.  History of lightheadedness, seen by neurology.  EEG normal, MRI and carotid Doppler benign.  Closely followed by pulmonary for history of sarcoidosis/bronchiectasis.   Last seen by Dr. Dina Rich on July 06, 2021.  Was instructed that she could take an additional 30 mg of diltiazem as needed for palpitations.  Underwent orthostatics for lightheadedness, negative.  Recommended to follow-up with neurology and stated if no clear etiology found, to consider trial of midodrine.  Today she presents for scheduled follow-up. She states she recently had a CT scan done of her chest, was told she needs to follow-up with Cardiology. Continues to admit to longstanding hx of lightheadedness since 2021. Described as intermittent, sometimes gets worse, feeling lightheaded and fatigued today. Admits to intermittent chest pain (4/10) for a while, described as tightness along left upper anterior chest wall, lasting a few seconds in duration, and not bothersome per her report, occasional left upper extremity numbness, says she sleeps on her left side. Denies any shortness of breath, palpitations, syncope, presyncope, dizziness, orthopnea, PND, swelling or significant weight changes, acute bleeding, or claudication. Admits she has been noncompliant with CPAP but planning on resuming this.   SH: Husband is also a patient of mine.   EKGs/Labs/Other Studies Reviewed:   The following studies were reviewed today:   EKG:  EKG is ordered today.  The ekg ordered today demonstrates SB, 51 bpm, nonspecific ST segment abnormality, no acute ischemic changes.  CT chest 09/21/2022:  IMPRESSION: 1. Chronic imaging stigmata compatible with reported clinical history of sarcoidosis, with mild progression compared to prior study from 08/13/2021, as above. 2. Dilatation of the pulmonic trunk (4.2 cm in diameter), concerning for pulmonary arterial hypertension. 3. Aortic atherosclerosis  with mild aneurysmal dilatation of the ascending thoracic aorta (4.5 cm in diameter).   Aortic Atherosclerosis (ICD10-I70.0). Aortic aneurysm NOS (ICD10-I71.9).  Myoview 03/2021:    Lexiscan stress is electrically negative for ischemia   Myoview scan with probable normal perfusion and mild soft tissue attenuaton (breast)   No significant ischemia or scar   LVEF is 67% with normal wall motion.   Low risk study   Monitor 12/2020:  14 day monitor Rare supraventricular ectopy in the form of isolated PACs, coupletes, triplets. Rare episodes of SVT up to 12 beats. Rare ventricular ectopy in the form of isolated PVCs, couplets Reported symptoms correlate with sinus rhythm     Patch Wear Time:  13 days and 23 hours (2022-06-14T11:36:38-0400 to 2022-06-28T10:57:53-0400)   Patient had a min HR of 45 bpm, max HR of 151 bpm, and avg HR of 73 bpm. Predominant underlying rhythm was Sinus Rhythm. 11 Supraventricular Tachycardia runs occurred, the run with the fastest interval lasting 9 beats with a max rate of 148 bpm, the  longest lasting 12 beats with an avg rate of 122 bpm. Isolated SVEs were rare (<1.0%), SVE Couplets were rare (<1.0%), and SVE Triplets were rare (<1.0%). Isolated VEs were rare (<1.0%), VE Couplets were rare (<1.0%), and no VE Triplets were present.   Echo 10/2020:  1. Left ventricular ejection fraction, by estimation, is 55 to 60%. The  left ventricle has normal function. The left ventricle has no regional  wall motion abnormalities. Left ventricular diastolic parameters were  normal. Normal global longitudinal  strain of -20.3%.   2. Right ventricular systolic function is normal. The right ventricular  size is normal. There is normal pulmonary artery systolic pressure. The  estimated right ventricular systolic pressure is 35.5 mmHg.   3. The mitral valve is grossly normal. Trivial mitral valve  regurgitation.   4. The aortic valve is tricuspid. Aortic valve regurgitation is  not  visualized.   5. The inferior vena cava is normal in size with greater than 50%  respiratory variability, suggesting right atrial pressure of 3 mmHg.   Comparison(s): Echocardiogram done 09/13/19 showed an EF of 55-60%.  Recent Labs: 09/02/2022: ALT 14; BUN 18; Creatinine, Ser 1.24; Hemoglobin 12.8; Platelets 353; Potassium 4.6; Sodium 139; TSH 1.990  Recent Lipid Panel    Component Value Date/Time   CHOL 221 (H) 09/02/2022 0855   TRIG 65 09/02/2022 0855   HDL 72 09/02/2022 0855   CHOLHDL 3.1 09/02/2022 0855   CHOLHDL 4.0 04/22/2019 0839   VLDL 22 02/17/2014 0831   LDLCALC 138 (H) 09/02/2022 0855   LDLCALC 152 (H) 04/22/2019 0839    Risk Assessment/Calculations:   The 10-year ASCVD risk score (Arnett DK, et al., 2019) is: 2.3%   Values used to calculate the score:     Age: 79 years     Sex: Female     Is Non-Hispanic African American: Yes     Diabetic: No     Tobacco smoker: No     Systolic Blood Pressure: 118 mmHg     Is BP treated: Yes     HDL Cholesterol: 72 mg/dL     Total Cholesterol: 221 mg/dL  Home Medications   Current Meds  Medication  Sig   acetaminophen (TYLENOL) 500 MG tablet Take 500 mg by mouth every 4 (four) hours as needed for mild pain, moderate pain or headache.    albuterol (VENTOLIN HFA) 108 (90 Base) MCG/ACT inhaler Inhale 2 puffs into the lungs every 6 (six) hours as needed for wheezing or shortness of breath.   Budeson-Glycopyrrol-Formoterol (BREZTRI AEROSPHERE) 160-9-4.8 MCG/ACT AERO Inhale 2 puffs into the lungs in the morning and at bedtime.   buPROPion (WELLBUTRIN XL) 150 MG 24 hr tablet Take 150 mg by mouth every morning.   fluticasone (FLONASE) 50 MCG/ACT nasal spray Place 1 spray into both nostrils at bedtime.   lisinopril (ZESTRIL) 5 MG tablet Take 1 tablet (5 mg total) by mouth daily.   LORazepam (ATIVAN) 0.5 MG tablet Take 0.5 mg by mouth daily as needed.   metoCLOPramide (REGLAN) 5 MG tablet Take 1 tablet (5 mg total) by mouth 3  (three) times daily. Before meals and at bedtime   olmesartan (BENICAR) 5 MG tablet Take 5 mg by mouth daily.   pantoprazole (PROTONIX) 40 MG tablet TAKE 1 TABLET(40 MG) BY MOUTH DAILY   prazosin (MINIPRESS) 1 MG capsule 1 mg if needed   predniSONE (DELTASONE) 10 MG tablet Take 2 tablets (20 mg total) by mouth daily with breakfast for 14 days, THEN 1 tablet (10 mg total) daily with breakfast for 14 days.   rosuvastatin (CRESTOR) 10 MG tablet Take 1 tablet (10 mg total) by mouth daily.   XARELTO 20 MG TABS tablet TAKE 1 TABLET(20 MG) BY MOUTH DAILY WITH SUPPER   zolpidem (AMBIEN) 10 MG tablet Take 10 mg by mouth at bedtime as needed.    diltiazem (CARDIZEM) 30 MG tablet Take 1 tablet (30 mg total) by mouth 2 (two) times daily. May take an additional 30 mg tablet daily as needed for palpitations     Review of Systems    All other systems reviewed and are otherwise negative except as noted above.  Physical Exam    VS:  BP 118/72   Pulse (!) 51   Ht 5\' 5"  (1.651 m)   Wt 214 lb 9.6 oz (97.3 kg)   LMP 04/01/2021 (Exact Date)   SpO2 96%   BMI 35.71 kg/m  , BMI Body mass index is 35.71 kg/m.  Wt Readings from Last 3 Encounters:  10/03/22 214 lb 9.6 oz (97.3 kg)  09/26/22 213 lb 13.5 oz (97 kg)  09/02/22 219 lb 1.9 oz (99.4 kg)     GEN: Well nourished, well developed, in no acute distress. HEENT: normal. Neck: Supple, no JVD, carotid bruits, or masses. Cardiac: S1/S2, slow rate and regular rhythm, no murmurs, rubs, or gallops. No clubbing, cyanosis, edema.  Radials/PT 2+ and equal bilaterally.  Respiratory:  Respirations regular and unlabored, clear to auscultation bilaterally. GI: Soft, nontender, nondistended. MS: No deformity or atrophy. Skin: Warm and dry, no rash. Neuro:  Strength and sensation are intact. Psych: Normal affect.  Assessment & Plan    Atypical chest pain Very atypical presentation. Past NST in 2022 negative. No definite coronary artery calcifications noted on  recent chest CT. EKG reassuring today. Will continue to monitor at this time. No medication changes at this time. Heart healthy diet and regular cardiovascular exercise encouraged. ED precautions discussed.  Hx of palpitations, bradycardia Denies any recent palpitations. HR 51 bpm d/t noncompliance with CPAP. Instructed patient to take Diltiazem 30 mg daily PRN for palpitations. Heart healthy diet and regular cardiovascular exercise encouraged.   Lightheadedness Longstanding history.  Past orthostatics negative in office. Follow-up with Neurology as scheduled. If no clear etiology, agree with Dr. Verna Czech recommendation to consider future trial of midodrine to see if this relieves symptoms.   Pulmonary HTN, Sarcoidosis/Bronchiectasis, hx of unprovoked PE, OSA Denies any worsening symptoms. Continue to follow-up with Dr. Craige Cotta. Continue Xarelto, denies any bleeding issues, on appropriate dosage. Encouraged compliance with CPAP machine.   HLD LDL 138 08/2022. Managed by PCP. Recommend increasing Crestor to 20 mg daily. Will route note to PCP. Heart healthy diet and regular cardiovascular exercise encouraged.   6. Thoracic ascending aortic aneurysm Noted on chest CT 09/2022. Recommend repeating CT imaging at next follow-up visit for monitoring. Care precautions and ED precautions discussed.   7. CKD stage 3a Most recent labs show stable kidney function. Avoid nephrotoxic agents. Continue to follow-up with Nephrology and PCP.   Disposition: Follow up in 6 month(s) with Dina Rich, MD or APP.  Signed, Sharlene Dory, NP 10/03/2022, 11:28 AM [facility]

## 2022-10-04 ENCOUNTER — Encounter (HOSPITAL_COMMUNITY): Payer: Self-pay

## 2022-10-04 ENCOUNTER — Ambulatory Visit: Payer: BC Managed Care – PPO | Admitting: Family Medicine

## 2022-10-04 ENCOUNTER — Emergency Department (HOSPITAL_COMMUNITY)
Admission: EM | Admit: 2022-10-04 | Discharge: 2022-10-04 | Disposition: A | Discharge status: Home / Self Care | Payer: BC Managed Care – PPO | Attending: Emergency Medicine | Admitting: Emergency Medicine

## 2022-10-04 ENCOUNTER — Emergency Department (HOSPITAL_COMMUNITY): Payer: BC Managed Care – PPO

## 2022-10-04 ENCOUNTER — Other Ambulatory Visit: Payer: Self-pay

## 2022-10-04 ENCOUNTER — Encounter: Payer: Self-pay | Admitting: Family Medicine

## 2022-10-04 VITALS — BP 123/82 | HR 53 | Ht 65.0 in | Wt 214.1 lb

## 2022-10-04 DIAGNOSIS — K5909 Other constipation: Secondary | ICD-10-CM

## 2022-10-04 DIAGNOSIS — N939 Abnormal uterine and vaginal bleeding, unspecified: Secondary | ICD-10-CM | POA: Diagnosis not present

## 2022-10-04 DIAGNOSIS — K644 Residual hemorrhoidal skin tags: Secondary | ICD-10-CM | POA: Diagnosis not present

## 2022-10-04 DIAGNOSIS — K649 Unspecified hemorrhoids: Secondary | ICD-10-CM | POA: Diagnosis not present

## 2022-10-04 DIAGNOSIS — D72829 Elevated white blood cell count, unspecified: Secondary | ICD-10-CM | POA: Diagnosis not present

## 2022-10-04 DIAGNOSIS — N189 Chronic kidney disease, unspecified: Secondary | ICD-10-CM | POA: Insufficient documentation

## 2022-10-04 DIAGNOSIS — R457 State of emotional shock and stress, unspecified: Secondary | ICD-10-CM | POA: Diagnosis not present

## 2022-10-04 DIAGNOSIS — K529 Noninfective gastroenteritis and colitis, unspecified: Secondary | ICD-10-CM | POA: Diagnosis not present

## 2022-10-04 DIAGNOSIS — Z7901 Long term (current) use of anticoagulants: Secondary | ICD-10-CM | POA: Diagnosis not present

## 2022-10-04 DIAGNOSIS — R58 Hemorrhage, not elsewhere classified: Secondary | ICD-10-CM | POA: Diagnosis not present

## 2022-10-04 DIAGNOSIS — E669 Obesity, unspecified: Secondary | ICD-10-CM | POA: Diagnosis not present

## 2022-10-04 DIAGNOSIS — R1030 Lower abdominal pain, unspecified: Secondary | ICD-10-CM | POA: Diagnosis not present

## 2022-10-04 LAB — CBC WITH DIFFERENTIAL/PLATELET
Abs Immature Granulocytes: 0.1 10*3/uL — ABNORMAL HIGH (ref 0.00–0.07)
Basophils Absolute: 0 10*3/uL (ref 0.0–0.1)
Basophils Relative: 0 %
Eosinophils Absolute: 0 10*3/uL (ref 0.0–0.5)
Eosinophils Relative: 0 %
HCT: 41.7 % (ref 36.0–46.0)
Hemoglobin: 13.2 g/dL (ref 12.0–15.0)
Immature Granulocytes: 1 %
Lymphocytes Relative: 28 %
Lymphs Abs: 3.7 10*3/uL (ref 0.7–4.0)
MCH: 26.7 pg (ref 26.0–34.0)
MCHC: 31.7 g/dL (ref 30.0–36.0)
MCV: 84.4 fL (ref 80.0–100.0)
Monocytes Absolute: 1 10*3/uL (ref 0.1–1.0)
Monocytes Relative: 7 %
Neutro Abs: 8.6 10*3/uL — ABNORMAL HIGH (ref 1.7–7.7)
Neutrophils Relative %: 64 %
Platelets: 291 10*3/uL (ref 150–400)
RBC: 4.94 MIL/uL (ref 3.87–5.11)
RDW: 14 % (ref 11.5–15.5)
WBC: 13.5 10*3/uL — ABNORMAL HIGH (ref 4.0–10.5)
nRBC: 0 % (ref 0.0–0.2)

## 2022-10-04 LAB — COMPREHENSIVE METABOLIC PANEL
ALT: 22 U/L (ref 0–44)
AST: 19 U/L (ref 15–41)
Albumin: 3.6 g/dL (ref 3.5–5.0)
Alkaline Phosphatase: 73 U/L (ref 38–126)
Anion gap: 10 (ref 5–15)
BUN: 14 mg/dL (ref 6–20)
CO2: 25 mmol/L (ref 22–32)
Calcium: 9.1 mg/dL (ref 8.9–10.3)
Chloride: 104 mmol/L (ref 98–111)
Creatinine, Ser: 1.32 mg/dL — ABNORMAL HIGH (ref 0.44–1.00)
GFR, Estimated: 49 mL/min — ABNORMAL LOW (ref >60)
Glucose, Bld: 95 mg/dL (ref 70–99)
Potassium: 3.4 mmol/L — ABNORMAL LOW (ref 3.5–5.1)
Sodium: 139 mmol/L (ref 135–145)
Total Bilirubin: 1 mg/dL (ref 0.3–1.2)
Total Protein: 7 g/dL (ref 6.5–8.1)

## 2022-10-04 LAB — LIPASE, BLOOD: Lipase: 33 U/L (ref 11–51)

## 2022-10-04 MED ORDER — SENNOSIDES-DOCUSATE SODIUM 8.6-50 MG PO TABS
1.0000 | ORAL_TABLET | Freq: Every day | ORAL | 0 refills | Status: DC
Start: 2022-10-04 — End: 2022-12-06

## 2022-10-04 MED ORDER — IOHEXOL 300 MG/ML  SOLN
100.0000 mL | Freq: Once | INTRAMUSCULAR | Status: AC | PRN
  Administered 2022-10-04: 100 mL via INTRAVENOUS

## 2022-10-04 MED ORDER — AMOXICILLIN-POT CLAVULANATE 875-125 MG PO TABS
1.0000 | ORAL_TABLET | Freq: Once | ORAL | Status: AC
  Administered 2022-10-04: 1 via ORAL
  Filled 2022-10-04: qty 1

## 2022-10-04 MED ORDER — AMOXICILLIN-POT CLAVULANATE 875-125 MG PO TABS: 1.0000 | ORAL_TABLET | Freq: Two times a day (BID) | ORAL | 0 refills | Status: DC

## 2022-10-04 NOTE — ED Provider Notes (Signed)
[place] EMERGENCY DEPARTMENT AT [facility] Provider Note   CSN: [id number] Arrival date & time: 10/04/22  1759     History  Chief Complaint  Patient presents with   Vaginal Bleeding    Hailey Ross is a 52 y.o. female.   Vaginal Bleeding Associated symptoms: abdominal pain and dizziness   Associated symptoms: no fever and no nausea         Hailey Ross is a 52 y.o. female with past medical history of sarcoidosis, chronic kidney disease, prior PE, anticoagulated on Xarelto who presents to the Emergency Department complaining of sudden onset of vaginal bleeding earlier today.  States that she has been constipated for 1 week.  Was recommended by her GI provider to take Dulcolax and MiraLAX today she endorses a large bowel movement and about 2 hours later felt like she was going to have another bowel movement but wiped and saw blood coming from her vagina.  She endorses a sensation of "tightness" across her upper abdomen and cramping spasm type pains throughout her abdomen.  Symptoms have been associated with some weakness and dizziness upon standing as well.  Describes the blood as bright red in color.  Patient does not believe the blood is coming from her rectum.  Denies any nausea vomiting fever or chills.  Postmenopausal and had tubal ligation in the 1990s    Home Medications Prior to Admission medications   Medication Sig Start Date End Date Taking? Authorizing Provider  acetaminophen (TYLENOL) 500 MG tablet Take 500 mg by mouth every 4 (four) hours as needed for mild pain, moderate pain or headache.     [provider]  albuterol (VENTOLIN HFA) 108 (90 Base) MCG/ACT inhaler Inhale 2 puffs into the lungs every 6 (six) hours as needed for wheezing or shortness of breath. 08/11/21   Leath-Warren, Sadie Haber, NP  Budeson-Glycopyrrol-Formoterol (BREZTRI AEROSPHERE) 160-9-4.8 MCG/ACT AERO Inhale 2 puffs into the lungs in the morning and at bedtime. 08/12/22    Coralyn Helling, MD  buPROPion (WELLBUTRIN XL) 150 MG 24 hr tablet Take 150 mg by mouth every morning. 09/12/22   [provider]  diltiazem (CARDIZEM) 30 MG tablet Take 1 tablet (30 mg total) by mouth daily. May take an additional 30 mg tablet daily as needed for palpitations 10/03/22   Sharlene Dory, NP  fluticasone [facility]) 50 MCG/ACT nasal spray Place 1 spray into both nostrils at bedtime. 08/04/21   Coralyn Helling, MD  lisinopril (ZESTRIL) 5 MG tablet Take 1 tablet (5 mg total) by mouth daily. 12/17/21   Paseda, Baird Kay, FNP  LORazepam (ATIVAN) 0.5 MG tablet Take 0.5 mg by mouth daily as needed. 01/21/21   [provider]  metoCLOPramide (REGLAN) 5 MG tablet Take 1 tablet (5 mg total) by mouth 3 (three) times daily. Before meals and at bedtime 06/10/22   Zehr, Shanda Bumps D, PA-C  olmesartan (BENICAR) 5 MG tablet Take 5 mg by mouth daily. 08/01/22   [provider]  pantoprazole (PROTONIX) 40 MG tablet TAKE 1 TABLET(40 MG) BY MOUTH DAILY 04/14/22   Parrett, Virgel Bouquet, NP  prazosin (MINIPRESS) 1 MG capsule 1 mg if needed 07/27/20   [provider]  predniSONE (DELTASONE) 10 MG tablet Take 2 tablets (20 mg total) by mouth daily with breakfast for 14 days, THEN 1 tablet (10 mg total) daily with breakfast for 14 days. 09/26/22 10/24/22  Coralyn Helling, MD  rosuvastatin (CRESTOR) 10 MG tablet Take 1 tablet (10 mg total) by  mouth daily. 09/06/22   Gilmore Laroche, FNP  senna-docusate (SENOKOT-S) 8.6-50 MG tablet Take 1 tablet by mouth daily. 10/04/22   Gilmore Laroche, FNP  XARELTO 20 MG TABS tablet TAKE 1 TABLET(20 MG) BY MOUTH DAILY WITH SUPPER 07/06/22   Parrett, Karen Kinnard S, NP  zolpidem (AMBIEN) 10 MG tablet Take 10 mg by mouth at bedtime as needed. 03/18/21   [provider]      Allergies    Patient has no known allergies.    Review of Systems   Review of Systems  Constitutional:  Negative for appetite change and fever.  Respiratory:  Negative for shortness of breath.    Cardiovascular:  Negative for chest pain.  Gastrointestinal:  Positive for abdominal pain and diarrhea. Negative for nausea and vomiting.  Genitourinary:  Positive for vaginal bleeding.  Neurological:  Positive for dizziness and light-headedness. Negative for syncope and weakness.  Psychiatric/Behavioral:  Negative for confusion.     Physical Exam Updated Vital Signs BP 139/87   Pulse 67   Temp 98.2 F (36.8 C) (Oral)   Resp 18   Ht 5\' 5"  (1.651 m)   Wt 97.1 kg   LMP 04/01/2021 (Exact Date)   SpO2 91%   BMI 35.63 kg/m  Physical Exam Vitals and nursing note reviewed.  Constitutional:      General: She is not in acute distress.    Appearance: Normal appearance. She is not ill-appearing or toxic-appearing.  HENT:     Mouth/Throat:     Mouth: Mucous membranes are moist.     Pharynx: Oropharynx is clear.  Cardiovascular:     Rate and Rhythm: Normal rate and regular rhythm.     Pulses: Normal pulses.  Pulmonary:     Effort: Pulmonary effort is normal.  Abdominal:     General: There is no distension.     Palpations: Abdomen is soft. There is no mass.     Tenderness: There is abdominal tenderness in the right upper quadrant, epigastric area and left upper quadrant. There is no guarding.  Genitourinary:    Rectum: Guaiac result positive. External hemorrhoid present. No mass. Normal anal tone.     Comments: DRE performed by me, small external hemorrhoid seen, no active bleeding.  No palpable rectal mass. Musculoskeletal:        General: Normal range of motion.     Right lower leg: No edema.     Left lower leg: No edema.  Skin:    General: Skin is warm.     Capillary Refill: Capillary refill takes less than 2 seconds.  Neurological:     General: No focal deficit present.     Mental Status: She is alert.     Sensory: No sensory deficit.     Motor: No weakness.     ED Results / Procedures / Treatments   Labs (all labs ordered are listed, but only abnormal results are  displayed) Labs Reviewed  CBC WITH DIFFERENTIAL/PLATELET - Abnormal; Notable for the following components:      Result Value   WBC 13.5 (*)    Neutro Abs 8.6 (*)    Abs Immature Granulocytes 0.10 (*)    All other components within normal limits  COMPREHENSIVE METABOLIC PANEL - Abnormal; Notable for the following components:   Potassium 3.4 (*)    Creatinine, Ser 1.32 (*)    GFR, Estimated 49 (*)    All other components within normal limits  LIPASE, BLOOD  POC OCCULT BLOOD, ED  EKG None  Radiology CT ABDOMEN PELVIS W CONTRAST  Result Date: 10/04/2022 CLINICAL DATA:  Constipation and lower abdominal pain. EXAM: CT ABDOMEN AND PELVIS WITH CONTRAST TECHNIQUE: Multidetector CT imaging of the abdomen and pelvis was performed using the standard protocol following bolus administration of intravenous contrast. RADIATION DOSE REDUCTION: This exam was performed according to the departmental dose-optimization program which includes automated exposure control, adjustment of the mA and/or kV according to patient size and/or use of iterative reconstruction technique. CONTRAST:  OMNIPAQUE IOHEXOL 300 MG/ML  SOLN COMPARISON:  April 22, 2021 FINDINGS: Lower chest: No acute abnormality. Hepatobiliary: No focal liver abnormality is seen. Status post cholecystectomy. No biliary dilatation. Pancreas: Unremarkable. No pancreatic ductal dilatation or surrounding inflammatory changes. Spleen: Normal in size without focal abnormality. Adrenals/Urinary Tract: Adrenal glands are unremarkable. Kidneys are normal, without renal calculi, focal lesion, or hydronephrosis. Bladder is unremarkable. Stomach/Bowel: Stomach is within normal limits. Appendix appears normal. No evidence of bowel dilatation. The ascending and proximal to mid transverse colon are mildly thickened and inflamed. Vascular/Lymphatic: No significant vascular findings are present. No enlarged abdominal or pelvic lymph nodes. Reproductive: Uterus  and bilateral adnexa are unremarkable. Other: No abdominal wall hernia or abnormality. No abdominopelvic ascites. Musculoskeletal: No acute or significant osseous findings. IMPRESSION: 1. Mild colitis involving the ascending and proximal to mid transverse colon. 2. Evidence of prior cholecystectomy. Electronically Signed   By: Aram Candela M.D.   On: 10/04/2022 20:55     Procedures Procedures    Medications Ordered in ED Medications - No data to display  ED Course/ Medical Decision Making/ A&P                             Medical Decision Making Patient here with concern for vaginal bleeding with abdominal pain.  Describes pain of her upper abdomen.  Believes that she had blood from her vagina after having a large bowel movement.  Was constipated for 1 week bowels moved after taking laxative.  Also reports some generalized weakness and dizziness with standing.  Differential would include but not limited to vaginal bleeding, bleeding external hemorrhoid, acute GI bleed,  Amount and/or Complexity of Data Reviewed Labs: ordered.    Details: Labs interpreted by me, mild leukocytosis with white count of 13,000, chemistries without significant derangement, mild elevated serum creatinine but near baseline.  Lipase unremarkable Radiology: ordered.    Details: CT abdomen and pelvis shows mild colitis involving the ascending and proximal mid transverse colon Discussion of management or test interpretation with external provider(s): Patient here with abdominal pain reported vaginal bleeding after large bowel movement.  I suspect that bleeding secondary to a bleeding external hemorrhoid.  CT abdomen pelvis shows colitis which is likely cause of patient's abdominal pain.  Patient overall well-appearing.  Hemodynamically stable.  Felt to be appropriate for discharge home, will start on antibiotics recommended sitz bath's and over-the-counter hemorrhoid cream as hemorrhoid is small.  I feel that she  would benefit from GI follow-up, resources information provided.  She is agreeable to close outpatient follow-up and return precautions were given.  Risk Prescription drug management.            Final Clinical Impression(s) / ED Diagnoses Final diagnoses:  Colitis  External hemorrhoid    Rx / DC Orders ED Discharge Orders     None         Rosey Bath 10/07/22 1515    Bethann Berkshire,  MD 10/10/22 1137

## 2022-10-04 NOTE — Assessment & Plan Note (Addendum)
Reports that she has not had a BM for 1 week She has tried otc miralax with minimum relief Will treat today with senokot Encouraged to increase her physical activity, fiber intake and fluid intake Encouraged to f/u with GI as scheduled

## 2022-10-04 NOTE — Progress Notes (Signed)
Established Patient Office Visit  Subjective:  Patient ID: Hailey Ross, female    DOB: 1971-03-24  Age: 52 y.o. MRN: [id number]  CC:  Chief Complaint  Patient presents with   Obesity    Pt would like to discuss weight loss medication options since A1c level is up and is obese. Has tried to eat better more fruits and vegetables, has also added walking to her daily routine, needs a boost.     HPI Hailey Ross is a 52 y.o. female with past medical history of GERD, essential hypertension and atypical chest pain presents for f/u of  chronic medical conditions. For the details of today's visit, please refer to the assessment and plan.     Past Medical History:  Diagnosis Date   Acute cholecystitis 08/24/2016   Acute kidney injury superimposed on chronic kidney disease (HCC) 06/20/2019   Acute respiratory failure with hypoxia (HCC) 06/19/2019   Annual visit for general adult medical examination with abnormal findings 04/16/2019   Arthritis    right knee   Bronchiectasis (HCC)    CKD (chronic kidney disease)    Iron deficiency    Lightheadedness 05/10/2019   PE (pulmonary thromboembolism) (HCC) 06/2019   Pre-syncope 04/16/2019   Sarcoidosis    Sarcoidosis    Sleep apnea    c pap   Smoker 02/12/2014    Past Surgical History:  Procedure Laterality Date   CHOLECYSTECTOMY N/A 08/25/2016   Procedure: LAPAROSCOPIC CHOLECYSTECTOMY;  Surgeon: Abigail Miyamoto, MD;  Location: [facility] OR;  Service: General;  Laterality: N/A;   TUBAL LIGATION  02/1993    Family History  Problem Relation Age of Onset   Hypertension Mother    Cancer Mother    Colon polyps Mother    Healthy Sister    Healthy Sister    Healthy Sister    Healthy Sister    Healthy Son    Healthy Son    Healthy Daughter    Colon cancer Neg Hx    Esophageal cancer Neg Hx    Rectal cancer Neg Hx    Stomach cancer Neg Hx     Social History   Socioeconomic History   Marital status: Married    Spouse name: Not on  file   Number of children: 3   Years of education: Not on file   Highest education level: High school graduate  Occupational History   Occupation: Administrator, [employer] ([employer])  Tobacco Use   Smoking status: Former    Packs/day: 1.00    Years: 20.00    Additional pack years: 0.00    Total pack years: 20.00    Types: Cigarettes    Quit date: 03/21/2015    Years since quitting: 7.5    Passive exposure: Past   Smokeless tobacco: Never  Vaping Use   Vaping Use: Never used  Substance and Sexual Activity   Alcohol use: Not Currently    Comment: Rare   Drug use: Never   Sexual activity: Yes    Birth control/protection: Surgical  Other Topics Concern   Not on file  Social History Narrative   Live with partners [name]-19 years   3 children.    1-[name] (girl) 27 at home with her: 2 grandchildren in her home as well    [name]- 26 expecting   [name]-25      Enjoy: read, play games on tablet, grandkids      Diet: Veggies, does not eat a lot of fried foods, enjoys chicken and  fruit   Caffeine: sodas and coffee (with sugar)   Water: Does not drink a lot at all      Wears seat beat   Does not wear sunscreen   Smoke and carbon monoxide detectors   Does not use phone while driving    Social Determinants of Health   Financial Resource Strain: Low Risk  (10/10/2018)   Overall Financial Resource Strain (CARDIA)    Difficulty of Paying Living Expenses: Not hard at all  Food Insecurity: No Food Insecurity (10/10/2018)   Hunger Vital Sign    Worried About Running Out of Food in the Last Year: Never true    Ran Out of Food in the Last Year: Never true  Transportation Needs: No Transportation Needs (10/10/2018)   PRAPARE - Administrator, Civil Service (Medical): No    Lack of Transportation (Non-Medical): No  Physical Activity: Sufficiently Active (10/10/2018)   Exercise Vital Sign    Days of Exercise per Week: 7 days    Minutes of Exercise per Session: 60 min  Stress: No Stress  Concern Present (10/10/2018)   [employer] of Occupational Health - Occupational Stress Questionnaire    Feeling of Stress : Not at all  Social Connections: Moderately Isolated (10/10/2018)   Social Connection and Isolation Panel [NHANES]    Frequency of Communication with Friends and Family: More than three times a week    Frequency of Social Gatherings with Friends and Family: Once a week    Attends Religious Services: More than 4 times per year    Active Member of [employer] or Organizations: No    Attends Banker Meetings: Never    Marital Status: Never married  Intimate Partner Violence: Not At Risk (10/10/2018)   Humiliation, Afraid, Rape, and Kick questionnaire    Fear of Current or Ex-Partner: No    Emotionally Abused: No    Physically Abused: No    Sexually Abused: No    Outpatient Medications Prior to Visit  Medication Sig Dispense Refill   acetaminophen (TYLENOL) 500 MG tablet Take 500 mg by mouth every 4 (four) hours as needed for mild pain, moderate pain or headache.      albuterol (VENTOLIN HFA) 108 (90 Base) MCG/ACT inhaler Inhale 2 puffs into the lungs every 6 (six) hours as needed for wheezing or shortness of breath. 8 g 0   Budeson-Glycopyrrol-Formoterol (BREZTRI AEROSPHERE) 160-9-4.8 MCG/ACT AERO Inhale 2 puffs into the lungs in the morning and at bedtime. 10.7 g 3   buPROPion (WELLBUTRIN XL) 150 MG 24 hr tablet Take 150 mg by mouth every morning.     diltiazem (CARDIZEM) 30 MG tablet Take 1 tablet (30 mg total) by mouth daily. May take an additional 30 mg tablet daily as needed for palpitations 135 tablet 1   fluticasone (FLONASE) 50 MCG/ACT nasal spray Place 1 spray into both nostrils at bedtime. 16 g 2   lisinopril (ZESTRIL) 5 MG tablet Take 1 tablet (5 mg total) by mouth daily. 90 tablet 1   LORazepam (ATIVAN) 0.5 MG tablet Take 0.5 mg by mouth daily as needed.     metoCLOPramide (REGLAN) 5 MG tablet Take 1 tablet (5 mg total) by mouth 3 (three) times  daily. Before meals and at bedtime 90 tablet 1   olmesartan (BENICAR) 5 MG tablet Take 5 mg by mouth daily.     pantoprazole (PROTONIX) 40 MG tablet TAKE 1 TABLET(40 MG) BY MOUTH DAILY 90 tablet 3   prazosin (MINIPRESS)  1 MG capsule 1 mg if needed     predniSONE (DELTASONE) 10 MG tablet Take 2 tablets (20 mg total) by mouth daily with breakfast for 14 days, THEN 1 tablet (10 mg total) daily with breakfast for 14 days. 42 tablet 0   rosuvastatin (CRESTOR) 10 MG tablet Take 1 tablet (10 mg total) by mouth daily. 90 tablet 3   XARELTO 20 MG TABS tablet TAKE 1 TABLET(20 MG) BY MOUTH DAILY WITH SUPPER 90 tablet 3   zolpidem (AMBIEN) 10 MG tablet Take 10 mg by mouth at bedtime as needed.     No facility-administered medications prior to visit.    No Known Allergies  ROS Review of Systems  Constitutional:  Negative for chills and fever.  Eyes:  Negative for visual disturbance.  Respiratory:  Negative for chest tightness and shortness of breath.   Neurological:  Negative for dizziness and headaches.      Objective:    Physical Exam Constitutional:      Appearance: She is obese.  HENT:     Head: Normocephalic.     Mouth/Throat:     Mouth: Mucous membranes are moist.  Cardiovascular:     Rate and Rhythm: Normal rate.     Heart sounds: Normal heart sounds.  Pulmonary:     Effort: Pulmonary effort is normal.     Breath sounds: Normal breath sounds.  Neurological:     Mental Status: She is alert.     BP 123/82   Pulse (!) 53   Ht 5\' 5"  (1.651 m)   Wt 214 lb 1.9 oz (97.1 kg)   LMP 04/01/2021 (Exact Date)   SpO2 92%   BMI 35.63 kg/m  Wt Readings from Last 3 Encounters:  10/04/22 214 lb 1.9 oz (97.1 kg)  10/03/22 214 lb 9.6 oz (97.3 kg)  09/26/22 213 lb 13.5 oz (97 kg)    Lab Results  Component Value Date   TSH 1.990 09/02/2022   Lab Results  Component Value Date   WBC 10.0 09/02/2022   HGB 12.8 09/02/2022   HCT 41.3 09/02/2022   MCV 83 09/02/2022   PLT 353  09/02/2022   Lab Results  Component Value Date   NA 139 09/02/2022   K 4.6 09/02/2022   CO2 23 09/02/2022   GLUCOSE 84 09/02/2022   BUN 18 09/02/2022   CREATININE 1.24 (H) 09/02/2022   BILITOT 0.5 09/02/2022   ALKPHOS 113 09/02/2022   AST 17 09/02/2022   ALT 14 09/02/2022   PROT 7.1 09/02/2022   ALBUMIN 4.2 09/02/2022   CALCIUM 9.4 09/02/2022   ANIONGAP 9 11/19/2021   EGFR 52 (L) 09/02/2022   GFR 53.13 (L) 04/20/2021   Lab Results  Component Value Date   CHOL 221 (H) 09/02/2022   Lab Results  Component Value Date   HDL 72 09/02/2022   Lab Results  Component Value Date   LDLCALC 138 (H) 09/02/2022   Lab Results  Component Value Date   TRIG 65 09/02/2022   Lab Results  Component Value Date   CHOLHDL 3.1 09/02/2022   Lab Results  Component Value Date   HGBA1C 6.2 (H) 09/02/2022      Assessment & Plan:  Obesity (BMI 30-39.9) Assessment & Plan: Admit to minimum physical activity  Reports eating a mixture of fruits and vegetables but also eating fatty, greasy foods Reviewed my weight management plan with the patient Encouraged to implement lifestyle changes for 3 months Wt Readings from Last 3 Encounters:  10/04/22 214 lb 1.9 oz (97.1 kg)  10/03/22 214 lb 9.6 oz (97.3 kg)  09/26/22 213 lb 13.5 oz (97 kg)      Other constipation Assessment & Plan: Reports that she has not had a BM for 1 week She has tried otc miralax with minimum relief Will treat today with senokot Encouraged to increase her physical activity, fiber intake and fluid intake Encouraged to f/u with GI as scheduled  Orders: -     Sennosides-Docusate Sodium; Take 1 tablet by mouth daily.  Dispense: 20 tablet; Refill: 0    Follow-up: Return in about 1 month (around 11/03/2022) for obesity.   Gilmore Laroche, FNP

## 2022-10-04 NOTE — Assessment & Plan Note (Signed)
Admit to minimum physical activity  Reports eating a mixture of fruits and vegetables but also eating fatty, greasy foods Reviewed my weight management plan with the patient Encouraged to implement lifestyle changes for 3 months Wt Readings from Last 3 Encounters:  10/04/22 214 lb 1.9 oz (97.1 kg)  10/03/22 214 lb 9.6 oz (97.3 kg)  09/26/22 213 lb 13.5 oz (97 kg)

## 2022-10-04 NOTE — Patient Instructions (Addendum)
I appreciate the opportunity to provide care to you today!    Follow up:  1 month  Healthy weight loss tips Eat three meals per day at times discussed. Cut out all diet bevergages and drink only water Eat whole food plant based meals Cut out junk food, fast food and processed foods Exercise 150 minutes a week Lose 1-2 lbs per week. Keep a food journal Choose foods that grow in a garden or in a fruit orchard and protein of animals with fins or feathers.  Lifestyle Medicine - Whole Food, Plant Predominant Nutrition is highly recommended: Eat Plenty of vegetables, Mushrooms, fruits, Legumes, Whole Grains, Nuts, seeds in lieu of processed meats, processed snacks/pastries red meat, poultry, eggs.    -It is better to avoid simple carbohydrates including: Cakes, Sweet Desserts, Ice Cream, Soda (diet and regular), Sweet Tea, Candies, Chips, Cookies, Store Bought Juices, Alcohol in Excess of  1-2 drinks a day, Lemonade,  Artificial Sweeteners, Doughnuts, Coffee Creamers, "Sugar-free" Products, etc, etc.  This is not a complete list..... Exercise: If you are able: 30 -60 minutes a day ,4 days a week, or 150 minutes a week.  The longer the better.  Combine stretch, strength, and aerobic activities.  If you were told in the past that you have high risk for cardiovascular diseases, you may seek evaluation by your heart doctor prior to initiating moderate to intense exercise programs.     Please continue to a heart-healthy diet and increase your physical activities. Try to exercise for at least five days a week.      It was a pleasure to see you and I look forward to continuing to work together on your health and well-being. Please do not hesitate to call the office if you need care or have questions about your care.   Have a wonderful day and week. With Gratitude, Gilmore Laroche MSN, FNP-BC

## 2022-10-04 NOTE — ED Triage Notes (Signed)
[facility] for vaginal bleeding. Pt was constipated about 7 days ("extremely small amt') advised from GI dr to take Miralx and Dulcolax to unstop. Took 3 scoops miralax and 4 ducolax today and had bm. Then about 2 hours later, felt she had to have another bm, when wiped saw blood. VS wnl, not painful, but cramping "contracting" feeling. "Tightness around upper abdomen. Pt hx of Pulmonary embolism in 2021.

## 2022-10-04 NOTE — Discharge Instructions (Signed)
Your workup this evening shows you likely have colitis.  Will treat this with antibiotics.  Please take the antibiotic as directed until finished.  Your bleeding is felt to come from a hemorrhoid.  Try using over-the-counter Preparation H twice daily.  You may also do sitz bath's.  Please call your GI provider to arrange follow-up appointment.  Return to the emergency department for any new or worsening symptoms.

## 2022-10-05 ENCOUNTER — Telehealth: Payer: Self-pay

## 2022-10-05 MED ORDER — DILTIAZEM HCL 30 MG PO TABS
30.0000 mg | ORAL_TABLET | Freq: Every day | ORAL | 1 refills | Status: AC | PRN
Start: 2022-10-05 — End: ?

## 2022-10-05 NOTE — Addendum Note (Signed)
Addended by: Sharlene Dory on: 10/05/2022 04:18 PM   Modules accepted: Orders

## 2022-10-05 NOTE — Transitions of Care (Post Inpatient/ED Visit) (Signed)
10/05/2022  Name: Hailey Ross MRN: [id number] DOB: 1970-09-27  Today's TOC FU Call Status: Today's TOC FU Call Status:: Successful TOC FU Call Competed TOC FU Call Complete Date: 10/05/22  Transition Care Management Follow-up Telephone Call Date of Discharge: 10/04/22 [facility] Type of Discharge: Emergency Department Reason for ED Visit: Other: (Colitis) How have you been since you were released from the hospital?: Better Any questions or concerns?: (S) Yes Patient Questions/Concerns:: pt not sure if she should take fiber or stool softener- has reached out to GI and waiting to hear back from them- also was told that her Crestor may have caused her constipation.Marland Kitchen and to stop that..  Items Reviewed: Did you receive and understand the discharge instructions provided?: Yes Medications obtained,verified, and reconciled?: Yes (Medications Reviewed) Any new allergies since your discharge?: No Dietary orders reviewed?: Yes  Medications Reviewed Today: Medications Reviewed Today     Reviewed by Merleen Nicely, LPN (Licensed Practical Nurse) on 10/05/22 at 1309  Med List Status: <None>   Medication Order Taking? Sig Documenting Provider Last Dose Status Informant  acetaminophen (TYLENOL) 500 MG tablet [id number] Yes Take 500 mg by mouth every 4 (four) hours as needed for mild pain, moderate pain or headache.  [provider] Taking Active Self  albuterol (VENTOLIN HFA) 108 (90 Base) MCG/ACT inhaler [id number] Yes Inhale 2 puffs into the lungs every 6 (six) hours as needed for wheezing or shortness of breath. Leath-Warren, Sadie Haber, NP Taking Active   amoxicillin-clavulanate (AUGMENTIN) 875-125 MG tablet [id number] Yes Take 1 tablet by mouth every 12 (twelve) hours. Triplett, Tammy, PA-C Taking Active   Budeson-Glycopyrrol-Formoterol (BREZTRI AEROSPHERE) 160-9-4.8 MCG/ACT AERO [id number] Yes Inhale 2 puffs into the lungs in the morning and at bedtime.  Coralyn Helling, MD Taking Active   buPROPion (WELLBUTRIN XL) 150 MG 24 hr tablet [id number] Yes Take 150 mg by mouth every morning. [provider] Taking Active   diltiazem (CARDIZEM) 30 MG tablet [id number] Yes Take 1 tablet (30 mg total) by mouth daily. May take an additional 30 mg tablet daily as needed for palpitations Sharlene Dory, NP Taking Active   fluticasone (FLONASE) 50 MCG/ACT nasal spray [id number] Yes Place 1 spray into both nostrils at bedtime. Coralyn Helling, MD Taking Active            Med Note Laural Benes, Maximino Sarin   Fri Dec 17, 2021  8:42 AM) As needed  lisinopril (ZESTRIL) 5 MG tablet [id number] Yes Take 1 tablet (5 mg total) by mouth daily. Donell Beers, FNP Taking Active   LORazepam (ATIVAN) 0.5 MG tablet [id number] Yes Take 0.5 mg by mouth daily as needed. [provider] Taking Active   metoCLOPramide (REGLAN) 5 MG tablet [id number] Yes Take 1 tablet (5 mg total) by mouth 3 (three) times daily. Before meals and at bedtime Zehr, Princella Pellegrini, PA-C Taking Active   olmesartan (BENICAR) 5 MG tablet [id number] Yes Take 5 mg by mouth daily. [provider] Taking Active   pantoprazole (PROTONIX) 40 MG tablet [id number] Yes TAKE 1 TABLET(40 MG) BY MOUTH DAILY Parrett, Tammy S, NP Taking Active   prazosin (MINIPRESS) 1 MG capsule [id number] Yes 1 mg if needed [provider] Taking Active   predniSONE (DELTASONE) 10 MG tablet [id number] Yes Take 2 tablets (20 mg total) by mouth daily with breakfast for 14 days, THEN 1 tablet (10 mg total) daily with breakfast for 14 days. Coralyn Helling, MD Taking Active   rosuvastatin (CRESTOR) 10 MG  tablet [id number] No Take 1 tablet (10 mg total) by mouth daily.  Patient not taking: Reported on 10/05/2022   Gilmore Laroche, FNP Not Taking Active   senna-docusate (SENOKOT-S) 8.6-50 MG tablet [id number] No Take 1 tablet by mouth daily.  Patient not taking: Reported on 10/05/2022   Gilmore Laroche, FNP Not Taking Active   XARELTO  20 MG TABS tablet [id number] Yes TAKE 1 TABLET(20 MG) BY MOUTH DAILY WITH SUPPER Parrett, Tammy S, NP Taking Active   zolpidem (AMBIEN) 10 MG tablet [id number] Yes Take 10 mg by mouth at bedtime as needed. [provider] Taking Active            Med Note Daneil Dolin Oct 03, 2022  9:55 AM)              Home Care and Equipment/Supplies: Were Home Health Services Ordered?: No Any new equipment or medical supplies ordered?: No  Functional Questionnaire: Do you need assistance with bathing/showering or dressing?: No Do you need assistance with meal preparation?: No Do you need assistance with eating?: No Do you have difficulty maintaining continence: No Do you need assistance with getting out of bed/getting out of a chair/moving?: No Do you have difficulty managing or taking your medications?: No  Follow up appointments reviewed: PCP Follow-up appointment confirmed?: No (pt needs appt to discuss medication- nothing available- will ask front desk to call pt to schedule fu appt) MD Provider Line Number:[phone] Given: Yes Follow-up Provider: Gilmore Laroche FNP Specialist Hospital Follow-up appointment confirmed?: No Reason Specialist Follow-Up Not Confirmed: Patient has Specialist Provider Number and will Call for Appointment Do you need transportation to your follow-up appointment?: No Do you understand care options if your condition(s) worsen?: Yes-patient verbalized understanding    SIGNATURE  Woodfin Ganja LPN [facility] Nurse Health Advisor Direct Dial [phone]

## 2022-10-05 NOTE — Telephone Encounter (Signed)
Hailey Ross see the notes from pt. She was instructed on bowel purge which had good results but she developed rectal bleeding and went to the ED. She was told the hemorrhoids were bleeding but she also had CT scan that shows mild colitis.  Please advise

## 2022-10-12 ENCOUNTER — Ambulatory Visit: Payer: Managed Care, Other (non HMO) | Admitting: Neurology

## 2022-10-18 ENCOUNTER — Encounter: Payer: Self-pay | Admitting: Family Medicine

## 2022-10-18 ENCOUNTER — Ambulatory Visit: Payer: BC Managed Care – PPO | Admitting: Family Medicine

## 2022-10-18 ENCOUNTER — Ambulatory Visit (HOSPITAL_BASED_OUTPATIENT_CLINIC_OR_DEPARTMENT_OTHER): Payer: BC Managed Care – PPO | Admitting: Adult Health

## 2022-10-18 ENCOUNTER — Encounter (HOSPITAL_BASED_OUTPATIENT_CLINIC_OR_DEPARTMENT_OTHER): Payer: Self-pay | Admitting: Adult Health

## 2022-10-18 ENCOUNTER — Other Ambulatory Visit: Payer: Self-pay | Admitting: Adult Health

## 2022-10-18 VITALS — BP 120/78 | HR 55 | Temp 98.5°F | Ht 65.0 in | Wt 213.6 lb

## 2022-10-18 VITALS — BP 129/85 | HR 91 | Ht 65.0 in | Wt 212.1 lb

## 2022-10-18 DIAGNOSIS — N1831 Chronic kidney disease, stage 3a: Secondary | ICD-10-CM

## 2022-10-18 DIAGNOSIS — E7849 Other hyperlipidemia: Secondary | ICD-10-CM | POA: Diagnosis not present

## 2022-10-18 DIAGNOSIS — I2699 Other pulmonary embolism without acute cor pulmonale: Secondary | ICD-10-CM | POA: Diagnosis not present

## 2022-10-18 DIAGNOSIS — J479 Bronchiectasis, uncomplicated: Secondary | ICD-10-CM | POA: Diagnosis not present

## 2022-10-18 DIAGNOSIS — K529 Noninfective gastroenteritis and colitis, unspecified: Secondary | ICD-10-CM | POA: Diagnosis not present

## 2022-10-18 DIAGNOSIS — G4733 Obstructive sleep apnea (adult) (pediatric): Secondary | ICD-10-CM

## 2022-10-18 DIAGNOSIS — Z23 Encounter for immunization: Secondary | ICD-10-CM | POA: Diagnosis not present

## 2022-10-18 DIAGNOSIS — D860 Sarcoidosis of lung: Secondary | ICD-10-CM | POA: Diagnosis not present

## 2022-10-18 MED ORDER — PREDNISONE 5 MG PO TABS
ORAL_TABLET | ORAL | 0 refills | Status: DC
Start: 2022-10-18 — End: 2022-12-05

## 2022-10-18 MED ORDER — FOLIC ACID 1 MG PO TABS: 1.0000 mg | ORAL_TABLET | Freq: Every day | ORAL | 5 refills | Status: DC

## 2022-10-18 MED ORDER — EZETIMIBE 10 MG PO TABS
10.0000 mg | ORAL_TABLET | Freq: Every day | ORAL | 3 refills | Status: DC
Start: 2022-10-18 — End: 2022-12-06

## 2022-10-18 MED ORDER — METHOTREXATE SODIUM 2.5 MG PO TABS: ORAL_TABLET | ORAL | 5 refills | Status: DC

## 2022-10-18 NOTE — Assessment & Plan Note (Signed)
Encouraged on CPAP compliance  

## 2022-10-18 NOTE — Assessment & Plan Note (Signed)
Clinically improved on Breztri.  Continue on mucociliary clearance regimen.  Plan  Patient Instructions  Begin Methotrexate 5mg  weekly, increase by 2.5mg  every 2 weeks until you get to 10mg  weekly.  Continue on Prednisone 10mg  daily for 2 week then decrease Prednisone 5mg  daily for 1 week and then every other day for 1 week and stop.  Begin Folic acid 1mg  daily.  Do not drink alcohol while taking.  Labs today.  Follow up in 6 weeks with Dr. Craige Cotta  or Martine Bleecker NP and As needed.

## 2022-10-18 NOTE — Patient Instructions (Addendum)
I appreciate the opportunity to provide care to you today!    Follow up:  3 months  Labs: please stop by the lab today to get your blood drawn (CBC, BMP)   Attached with your AVS, you will find valuable resources for self-education. I highly recommend dedicating some time to thoroughly examine them.   Please continue to a heart-healthy diet and increase your physical activities. Try to exercise for at least five days a week.    It was a pleasure to see you and I look forward to continuing to work together on your health and well-being. Please do not hesitate to call the office if you need care or have questions about your care.  In case of emergency, please visit the Emergency Department for urgent care, or contact our clinic at [phone] to schedule an appointment. We're here to help you!   Have a wonderful day and week. With Gratitude, Gilmore Laroche MSN, FNP-BC

## 2022-10-18 NOTE — Assessment & Plan Note (Addendum)
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  Clinically patient has improved on steroids.  Along with triple therapy ICS/bronchodilators. Long discussion regarding chronic use of steroids.  Patient had a significant decline in lung function on PFTs in March.  Recent CT chest shows progressive fibrotic changes.  Will place patient on a steroid sparing agent with methotrexate.  Slowly wean off of steroids.  Will begin methotrexate 5 mg weekly increasing by 2.5 mg every 2 weeks up to maintenance dose of 10 mg weekly.  Patient education given on methotrexate.  Advised of no alcohol usage.  Will also begin folic acid replacement.  Recent lab work with CBC and CMET , HEP C were reviewed and unrevealing.  Will need a Hep B test .  If negative can begin methotrexate.  She will slowly wean off of prednisone over the next few weeks. Continue with yearly eye exams Patient will return back in 4 to 6 weeks.   Plan  Patient Instructions  Begin Methotrexate 5mg  weekly, increase by 2.5mg  every 2 weeks until you get to 10mg  weekly.  Continue on Prednisone 10mg  daily for 2 week then decrease Prednisone 5mg  daily for 1 week and then every other day for 1 week and stop.  Begin Folic acid 1mg  daily.  Do not drink alcohol while taking.  Labs today.  Follow up in 6 weeks with Dr. Craige Cotta  or Wyeth Hoffer NP and As needed.

## 2022-10-18 NOTE — Assessment & Plan Note (Signed)
Continue follow-up with nephrology. 

## 2022-10-18 NOTE — Progress Notes (Signed)
Established Patient Office Visit  Subjective:  Patient ID: Hailey Ross, female    DOB: 1971/03/06  Age: 52 y.o. MRN: [id number]  CC:  Chief Complaint  Patient presents with   Follow-up    F/u from 10/04/2022    HPI Hailey Ross is a 52 y.o. female with past medical history of sarcoidosis, chronic kidney disease, prior PE, anticoagulated on Xarelto presents for ED f/u.  Colitis: The patient was seen in the ED on 10/04/2022 with complaints of constipation and abdominal pain.  She had a CT scan of the abdomen pelvis which showed mild colitis involving the ascending and proximal to mild transverse colon.  She was started on Augmentin for 7 days and recommended sitz bath's and over-the-counter hemorrhoid cream.  She reports not taking MiraLAX for about a week and reports not starting the Benefiber.  She has been taking probiotics and reports normal bowel movements with no complaints of constipation or hematochezia since discharged from the ED.  Past Medical History:  Diagnosis Date   Acute cholecystitis 08/24/2016   Acute kidney injury superimposed on chronic kidney disease (HCC) 06/20/2019   Acute respiratory failure with hypoxia (HCC) 06/19/2019   Annual visit for general adult medical examination with abnormal findings 04/16/2019   Arthritis    right knee   Bronchiectasis (HCC)    CKD (chronic kidney disease)    Iron deficiency    Lightheadedness 05/10/2019   PE (pulmonary thromboembolism) (HCC) 06/2019   Pre-syncope 04/16/2019   Sarcoidosis    Sarcoidosis    Sleep apnea    c pap   Smoker 02/12/2014    Past Surgical History:  Procedure Laterality Date   CHOLECYSTECTOMY N/A 08/25/2016   Procedure: LAPAROSCOPIC CHOLECYSTECTOMY;  Surgeon: Abigail Miyamoto, MD;  Location: [facility] OR;  Service: General;  Laterality: N/A;   TUBAL LIGATION  02/1993    Family History  Problem Relation Age of Onset   Hypertension Mother    Cancer Mother    Colon polyps Mother    Healthy Sister     Healthy Sister    Healthy Sister    Healthy Sister    Healthy Son    Healthy Son    Healthy Daughter    Colon cancer Neg Hx    Esophageal cancer Neg Hx    Rectal cancer Neg Hx    Stomach cancer Neg Hx     Social History   Socioeconomic History   Marital status: Married    Spouse name: Not on file   Number of children: 3   Years of education: Not on file   Highest education level: High school graduate  Occupational History   Occupation: Administrator, [employer] ([employer])  Tobacco Use   Smoking status: Former    Packs/day: 1.00    Years: 20.00    Additional pack years: 0.00    Total pack years: 20.00    Types: Cigarettes    Quit date: 03/21/2015    Years since quitting: 7.5    Passive exposure: Past   Smokeless tobacco: Never  Vaping Use   Vaping Use: Never used  Substance and Sexual Activity   Alcohol use: Not Currently    Comment: Rare   Drug use: Never   Sexual activity: Yes    Birth control/protection: Surgical  Other Topics Concern   Not on file  Social History Narrative   Live with partners [name]-19 years   3 children.    1-[name] (girl) 27 at home with her: 2 grandchildren  in her home as well    [name]- 26 expecting   [name]-25      Enjoy: read, play games on tablet, grandkids      Diet: Veggies, does not eat a lot of fried foods, enjoys chicken and fruit   Caffeine: sodas and coffee (with sugar)   Water: Does not drink a lot at all      Wears seat beat   Does not wear sunscreen   Smoke and carbon monoxide detectors   Does not use phone while driving    Social Determinants of Health   Financial Resource Strain: Low Risk  (10/10/2018)   Overall Financial Resource Strain (CARDIA)    Difficulty of Paying Living Expenses: Not hard at all  Food Insecurity: No Food Insecurity (10/10/2018)   Hunger Vital Sign    Worried About Running Out of Food in the Last Year: Never true    Ran Out of Food in the Last Year: Never true  Transportation Needs: No Transportation  Needs (10/10/2018)   PRAPARE - Administrator, Civil Service (Medical): No    Lack of Transportation (Non-Medical): No  Physical Activity: Sufficiently Active (10/10/2018)   Exercise Vital Sign    Days of Exercise per Week: 7 days    Minutes of Exercise per Session: 60 min  Stress: No Stress Concern Present (10/10/2018)   [employer] of Occupational Health - Occupational Stress Questionnaire    Feeling of Stress : Not at all  Social Connections: Moderately Isolated (10/10/2018)   Social Connection and Isolation Panel [NHANES]    Frequency of Communication with Friends and Family: More than three times a week    Frequency of Social Gatherings with Friends and Family: Once a week    Attends Religious Services: More than 4 times per year    Active Member of [employer] or Organizations: No    Attends Banker Meetings: Never    Marital Status: Never married  Intimate Partner Violence: Not At Risk (10/10/2018)   Humiliation, Afraid, Rape, and Kick questionnaire    Fear of Current or Ex-Partner: No    Emotionally Abused: No    Physically Abused: No    Sexually Abused: No    Outpatient Medications Prior to Visit  Medication Sig Dispense Refill   acetaminophen (TYLENOL) 500 MG tablet Take 500 mg by mouth every 4 (four) hours as needed for mild pain, moderate pain or headache.      albuterol (VENTOLIN HFA) 108 (90 Base) MCG/ACT inhaler Inhale 2 puffs into the lungs every 6 (six) hours as needed for wheezing or shortness of breath. 8 g 0   amoxicillin-clavulanate (AUGMENTIN) 875-125 MG tablet Take 1 tablet by mouth every 12 (twelve) hours. 14 tablet 0   Budeson-Glycopyrrol-Formoterol (BREZTRI AEROSPHERE) 160-9-4.8 MCG/ACT AERO Inhale 2 puffs into the lungs in the morning and at bedtime. 10.7 g 3   buPROPion (WELLBUTRIN XL) 150 MG 24 hr tablet Take 150 mg by mouth every morning.     diltiazem (CARDIZEM) 30 MG tablet Take 1 tablet (30 mg total) by mouth daily as needed (For  palpitations). May take an additional 30 mg tablet daily as needed for palpitations 135 tablet 1   fluticasone (FLONASE) 50 MCG/ACT nasal spray Place 1 spray into both nostrils at bedtime. 16 g 2   lisinopril (ZESTRIL) 5 MG tablet Take 1 tablet (5 mg total) by mouth daily. 90 tablet 1   LORazepam (ATIVAN) 0.5 MG tablet Take 0.5 mg by mouth daily  as needed.     metoCLOPramide (REGLAN) 5 MG tablet Take 1 tablet (5 mg total) by mouth 3 (three) times daily. Before meals and at bedtime 90 tablet 1   olmesartan (BENICAR) 5 MG tablet Take 5 mg by mouth daily.     pantoprazole (PROTONIX) 40 MG tablet TAKE 1 TABLET(40 MG) BY MOUTH DAILY 90 tablet 3   prazosin (MINIPRESS) 1 MG capsule 1 mg if needed     predniSONE (DELTASONE) 10 MG tablet Take 2 tablets (20 mg total) by mouth daily with breakfast for 14 days, THEN 1 tablet (10 mg total) daily with breakfast for 14 days. 42 tablet 0   senna-docusate (SENOKOT-S) 8.6-50 MG tablet Take 1 tablet by mouth daily. 20 tablet 0   XARELTO 20 MG TABS tablet TAKE 1 TABLET(20 MG) BY MOUTH DAILY WITH SUPPER 90 tablet 3   zolpidem (AMBIEN) 10 MG tablet Take 10 mg by mouth at bedtime as needed.     rosuvastatin (CRESTOR) 10 MG tablet Take 1 tablet (10 mg total) by mouth daily. (Patient not taking: Reported on 10/18/2022) 90 tablet 3   No facility-administered medications prior to visit.    No Known Allergies  ROS Review of Systems  Constitutional:  Negative for chills and fever.  Eyes:  Negative for visual disturbance.  Respiratory:  Negative for chest tightness and shortness of breath.   Neurological:  Negative for dizziness and headaches.      Objective:    Physical Exam HENT:     Head: Normocephalic.     Mouth/Throat:     Mouth: Mucous membranes are moist.  Cardiovascular:     Rate and Rhythm: Normal rate.     Heart sounds: Normal heart sounds.  Pulmonary:     Effort: Pulmonary effort is normal.     Breath sounds: Normal breath sounds.  Neurological:      Mental Status: She is alert.     BP 129/85   Pulse 91   Ht 5\' 5"  (1.651 m)   Wt 212 lb 1.9 oz (96.2 kg)   LMP 04/01/2021 (Exact Date)   SpO2 97%   BMI 35.30 kg/m  Wt Readings from Last 3 Encounters:  10/18/22 212 lb 1.9 oz (96.2 kg)  10/04/22 214 lb 1.8 oz (97.1 kg)  10/04/22 214 lb 1.9 oz (97.1 kg)    Lab Results  Component Value Date   TSH 1.990 09/02/2022   Lab Results  Component Value Date   WBC 13.5 (H) 10/04/2022   HGB 13.2 10/04/2022   HCT 41.7 10/04/2022   MCV 84.4 10/04/2022   PLT 291 10/04/2022   Lab Results  Component Value Date   NA 139 10/04/2022   K 3.4 (L) 10/04/2022   CO2 25 10/04/2022   GLUCOSE 95 10/04/2022   BUN 14 10/04/2022   CREATININE 1.32 (H) 10/04/2022   BILITOT 1.0 10/04/2022   ALKPHOS 73 10/04/2022   AST 19 10/04/2022   ALT 22 10/04/2022   PROT 7.0 10/04/2022   ALBUMIN 3.6 10/04/2022   CALCIUM 9.1 10/04/2022   ANIONGAP 10 10/04/2022   EGFR 52 (L) 09/02/2022   GFR 53.13 (L) 04/20/2021   Lab Results  Component Value Date   CHOL 221 (H) 09/02/2022   Lab Results  Component Value Date   HDL 72 09/02/2022   Lab Results  Component Value Date   LDLCALC 138 (H) 09/02/2022   Lab Results  Component Value Date   TRIG 65 09/02/2022   Lab Results  Component  Value Date   CHOLHDL 3.1 09/02/2022   Lab Results  Component Value Date   HGBA1C 6.2 (H) 09/02/2022      Assessment & Plan:  Colitis Assessment & Plan: She will be following up with GI in August 2024 Encouraged to continue increasing her fluid, fiber and exercise Encouraged adherence to recommendations made by GI providers Will assess CBC and BMP today  Orders: -     CBC -     BMP8+EGFR  Immunization due -     Varicella-zoster vaccine IM  Other hyperlipidemia -     Ezetimibe; Take 1 tablet (10 mg total) by mouth daily.  Dispense: 90 tablet; Refill: 3    Follow-up: Return in about 3 months (around 01/18/2023).   Gilmore Laroche, FNP

## 2022-10-18 NOTE — Assessment & Plan Note (Signed)
She will be following up with GI in August 2024 Encouraged to continue increasing her fluid, fiber and exercise Encouraged adherence to recommendations made by GI providers Will assess CBC and BMP today

## 2022-10-18 NOTE — Assessment & Plan Note (Signed)
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  Continue follow-up with hematology.  Endorses compliance on Xarelto.  Patient is occasion given on anticoagulation therapy

## 2022-10-18 NOTE — Patient Instructions (Addendum)
Begin Methotrexate 5mg  weekly, increase by 2.5mg  every 2 weeks until you get to 10mg  weekly.  Continue on Prednisone 10mg  daily for 2 week then decrease Prednisone 5mg  daily for 1 week and then every other day for 1 week and stop.  Begin Folic acid 1mg  daily.  Do not drink alcohol while taking.  Labs today.  Follow up in 6 weeks with Dr. Craige Cotta  or Parrett NP and As needed.

## 2022-10-18 NOTE — Progress Notes (Signed)
Reviewed and agree with assessment/plan.   Coralyn Helling, MD [facility] Pulmonary/Critical Care 10/18/2022, 3:14 PM Pager:  [phone]

## 2022-10-18 NOTE — Progress Notes (Signed)
@Patient  ID: Hailey Ross, female    DOB: [birth year], 52 y.o.   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 Medical history significant for chronic kidney disease and hypertension Patient raises her grandchildren (daughter passed away)  TEST/EVENTS :  Labs 04/21/15 >> HIV negative, Quantiferon gold negative, ACE 195, ANA, RF negative, ESR 32     Chest imaging:  CT chest 03/18/15 >> biapical thickening with traction BTX, BTX RML and lingula, subcarinal LAN 1.7 cm, patchy increased interstitial markings   HRCT chest 03/07/18 >> borderline LAN, patchy GGO, septal thickening, architectural distortion, cylindrical and varicose BTX     CT chest June 18, 2019 showed peripheral segmental and subsegmental bilateral pulmonary emboli, bulky mediastinal and hilar lymph nodes, bilateral apical and lingular honeycombing and bilateral upper lobe mild traction bronchiectasis.  Nonspecific groundglass opacities in the left base and right middle lobe.  Not significantly changed   CT chest Aug 13, 2021 right hilar and mediastinal adenopathy similar, biapical subpleural fibrosis with honeycombing and traction bronchiectasis cluster of groundglass density in the right lower lobe, negative for PE  High-resolution CT chest in September 15, 2022 showed patchy areas of groundglass attenuation, septal thickening, subpleural reticulation and bronchiectasis with scattered areas of honeycombing mid to upper lung predominance with more advanced fibrotic changes mild progression since May 2023   Venous Dopplers were negative for DVT.     2D echo showed a preserved EF.  Mildly reduced right ventricular systolic function and  moderately elevated pulmonary artery systolic pressure at 57 mmHg.  Moderate grade 3 protruding plaque involving the distal aortic arch and proximal descending aorta   Echo 09/2019    Home sleep study was done July 22, 2019.  This showed mild sleep apnea with an AHI at 9.5/hour and SPO2 low at 74%   PFT  PFT 05/26/15 >> FEV1 1.67 (66%), FEV1% 88, TLC 2/78 (53%), DLCO 36%   09/26/2019-pulmonary function test-FEV1 54%, ratio 91, FVC 47%, DLCO 45%  PFTs done on June 20, 2022 showed decreased lung function with FEV1 at 36%, ratio 83, FVC 34%, no significant bronchodilator response, total lung capacity 62%, DLCO 32%.   10/18/2022 Follow up: Fibrotic Sarcoid, Bronchiectasis , OSA  Patient presents for a 97-month follow-up.  Patient is followed for chronic fibrotic pulmonary sarcoidosis.  PFTs earlier this year in March showed decreased lung function.  Subsequent follow-up high-resolution CT chest June in June showed mild progression and fibrotic changes.  Patient was started low-dose steroid challenge.  Spiriva was changed over to [place].  Since last visit patient says she has noticed some improvement in her breathing.  Does not feel as short of breath.  Cough is improved and her activity level has improved slightly.  Patient denies any hemoptysis, chest pain, orthopnea or edema.  Patient was also changed off of her ACE inhibitor.  She is currently on prednisone 10 mg daily.  Patient has underlying sleep apnea.  Wears CPAP once or twice a week.  Says that she needs to become more compliant.  We discussed potential complications of untreated sleep apnea.  Says she feels better when she wears her CPAP.      No Known Allergies  Immunization History  Administered Date(s) Administered   Influenza Inj Mdck  Quad Pf 12/25/2019   Influenza,inj,Quad PF,6+ Mos 12/21/2018, 02/22/2021, 12/17/2021   Influenza,inj,Quad PF,6-35 Mos 01/10/2019   PFIZER(Purple Top)SARS-COV-2 Vaccination 08/14/2019, 09/10/2019,  02/07/2020   Pneumococcal Polysaccharide-23 02/12/2014   Tdap 02/12/2014   Zoster Recombinant(Shingrix) 12/17/2021, 10/18/2022    Past Medical History:  Diagnosis Date   Acute cholecystitis 08/24/2016   Acute kidney injury superimposed on chronic kidney disease (HCC) 06/20/2019   Acute respiratory failure with hypoxia (HCC) 06/19/2019   Annual visit for general adult medical examination with abnormal findings 04/16/2019   Arthritis    right knee   Bronchiectasis (HCC)    CKD (chronic kidney disease)    Iron deficiency    Lightheadedness 05/10/2019   PE (pulmonary thromboembolism) (HCC) 06/2019   Pre-syncope 04/16/2019   Sarcoidosis    Sarcoidosis    Sleep apnea    c pap   Smoker 02/12/2014    Tobacco History: Social History   Tobacco Use  Smoking Status Former   Packs/day: 1.00   Years: 20.00   Additional pack years: 0.00   Total pack years: 20.00   Types: Cigarettes   Quit date: 03/21/2015   Years since quitting: 7.5   Passive exposure: Past  Smokeless Tobacco Never   Counseling given: Not Answered   Outpatient Medications Prior to Visit  Medication Sig Dispense Refill   acetaminophen (TYLENOL) 500 MG tablet Take 500 mg by mouth every 4 (four) hours as needed for mild pain, moderate pain or headache.      albuterol (VENTOLIN HFA) 108 (90 Base) MCG/ACT inhaler Inhale 2 puffs into the lungs every 6 (six) hours as needed for wheezing or shortness of breath. 8 g 0   amoxicillin-clavulanate (AUGMENTIN) 875-125 MG tablet Take 1 tablet by mouth every 12 (twelve) hours. 14 tablet 0   Budeson-Glycopyrrol-Formoterol (BREZTRI AEROSPHERE) 160-9-4.8 MCG/ACT AERO Inhale 2 puffs into the lungs in the morning and at bedtime. 10.7 g 3   buPROPion (WELLBUTRIN XL) 150 MG 24 hr tablet Take 150 mg by mouth every morning.     diltiazem (CARDIZEM) 30 MG tablet Take 1 tablet (30 mg total) by mouth daily as needed (For palpitations). May take an additional 30 mg tablet daily as needed for  palpitations 135 tablet 1   ezetimibe (ZETIA) 10 MG tablet Take 1 tablet (10 mg total) by mouth daily. 90 tablet 3   fluticasone (FLONASE) 50 MCG/ACT nasal spray Place 1 spray into both nostrils at bedtime. 16 g 2   lisinopril (ZESTRIL) 5 MG tablet Take 1 tablet (5 mg total) by mouth daily. 90 tablet 1   LORazepam (ATIVAN) 0.5 MG tablet Take 0.5 mg by mouth daily as needed.     metoCLOPramide (REGLAN) 5 MG tablet Take 1 tablet (5 mg total) by mouth 3 (three) times daily. Before meals and at bedtime 90 tablet 1   olmesartan (BENICAR) 5 MG tablet Take 5 mg by mouth daily.     pantoprazole (PROTONIX) 40 MG tablet TAKE 1 TABLET(40 MG) BY MOUTH DAILY 90 tablet 3   prazosin (MINIPRESS) 1 MG capsule 1 mg if needed     senna-docusate (SENOKOT-S) 8.6-50 MG tablet Take 1 tablet by mouth daily. 20 tablet 0   XARELTO 20 MG TABS tablet TAKE 1 TABLET(20 MG) BY MOUTH DAILY WITH SUPPER 90 tablet 3   zolpidem (AMBIEN) 10 MG tablet Take 10 mg by mouth at bedtime as needed.     predniSONE (DELTASONE) 10 MG tablet Take 2 tablets (20 mg total) by mouth daily with breakfast  for 14 days, THEN 1 tablet (10 mg total) daily with breakfast for 14 days. 42 tablet 0   No facility-administered medications prior to visit.     Review of Systems:   Constitutional:   No  weight loss, night sweats,  Fevers, chills, + fatigue, or  lassitude.  HEENT:   No headaches,  Difficulty swallowing,  Tooth/dental problems, or  Sore throat,                No sneezing, itching, ear ache, nasal congestion, post nasal drip,   CV:  No chest pain,  Orthopnea, PND, swelling in lower extremities, anasarca, dizziness, palpitations, syncope.   GI  No heartburn, indigestion, abdominal pain, nausea, vomiting, diarrhea, change in bowel habits, loss of appetite, bloody stools.   Resp:  No chest wall deformity  Skin: no rash or lesions.  GU: no dysuria, change in color of urine, no urgency or frequency.  No flank pain, no hematuria   MS:  No  joint pain or swelling.  No decreased range of motion.  No back pain.    Physical Exam  BP 120/78   Pulse (!) 55   Temp 98.5 F (36.9 C) (Oral)   Ht 5\' 5"  (1.651 m)   Wt 213 lb 9.6 oz (96.9 kg)   LMP 04/01/2021 (Exact Date)   SpO2 95%   BMI 35.54 kg/m   GEN: A/Ox3; pleasant , NAD, well nourished    HEENT:  /AT,  NOSE-clear, THROAT-clear, no lesions, no postnasal drip or exudate noted.   NECK:  Supple w/ fair ROM; no JVD; normal carotid impulses w/o bruits; no thyromegaly or nodules palpated; no lymphadenopathy.    RESP  Clear  P & A; w/o, wheezes/ rales/ or rhonchi. no accessory muscle use, no dullness to percussion  CARD:  RRR, no m/r/g, no peripheral edema, pulses intact, no cyanosis or clubbing.  GI:   Soft & nt; nml bowel sounds; no organomegaly or masses detected.   Musco: Warm bil, no deformities or joint swelling noted.   Neuro: alert, no focal deficits noted.    Skin: Warm, no lesions or rashes    Lab Results:  CBC    Component Value Date/Time   WBC 13.5 (H) 10/04/2022 2005   RBC 4.94 10/04/2022 2005   HGB 13.2 10/04/2022 2005   HGB 12.8 09/02/2022 0855   HCT 41.7 10/04/2022 2005   HCT 41.3 09/02/2022 0855   PLT 291 10/04/2022 2005   PLT 353 09/02/2022 0855   MCV 84.4 10/04/2022 2005   MCV 83 09/02/2022 0855   MCH 26.7 10/04/2022 2005   MCHC 31.7 10/04/2022 2005   RDW 14.0 10/04/2022 2005   RDW 13.3 09/02/2022 0855   LYMPHSABS 3.7 10/04/2022 2005   LYMPHSABS 3.0 09/02/2022 0855   MONOABS 1.0 10/04/2022 2005   EOSABS 0.0 10/04/2022 2005   EOSABS 0.0 09/02/2022 0855   BASOSABS 0.0 10/04/2022 2005   BASOSABS 0.0 09/02/2022 0855    BMET    Component Value Date/Time   NA 139 10/04/2022 2005   NA 139 09/02/2022 0855   K 3.4 (L) 10/04/2022 2005   CL 104 10/04/2022 2005   CO2 25 10/04/2022 2005   GLUCOSE 95 10/04/2022 2005   BUN 14 10/04/2022 2005   BUN 18 09/02/2022 0855   CREATININE 1.32 (H) 10/04/2022 2005   CREATININE 1.27 (H)  08/30/2022 1041   CALCIUM 9.1 10/04/2022 2005   GFRNONAA 49 (L) 10/04/2022 2005   GFRNONAA 53 (L) 09/23/2020 1458  GFRAA 62 09/23/2020 1458    BNP No results found for: "BNP"  ProBNP No results found for: "PROBNP"  Imaging: CT ABDOMEN PELVIS W CONTRAST  Result Date: 10/04/2022 CLINICAL DATA:  Constipation and lower abdominal pain. EXAM: CT ABDOMEN AND PELVIS WITH CONTRAST TECHNIQUE: Multidetector CT imaging of the abdomen and pelvis was performed using the standard protocol following bolus administration of intravenous contrast. RADIATION DOSE REDUCTION: This exam was performed according to the departmental dose-optimization program which includes automated exposure control, adjustment of the mA and/or kV according to patient size and/or use of iterative reconstruction technique. CONTRAST:  OMNIPAQUE IOHEXOL 300 MG/ML  SOLN COMPARISON:  April 22, 2021 FINDINGS: Lower chest: No acute abnormality. Hepatobiliary: No focal liver abnormality is seen. Status post cholecystectomy. No biliary dilatation. Pancreas: Unremarkable. No pancreatic ductal dilatation or surrounding inflammatory changes. Spleen: Normal in size without focal abnormality. Adrenals/Urinary Tract: Adrenal glands are unremarkable. Kidneys are normal, without renal calculi, focal lesion, or hydronephrosis. Bladder is unremarkable. Stomach/Bowel: Stomach is within normal limits. Appendix appears normal. No evidence of bowel dilatation. The ascending and proximal to mid transverse colon are mildly thickened and inflamed. Vascular/Lymphatic: No significant vascular findings are present. No enlarged abdominal or pelvic lymph nodes. Reproductive: Uterus and bilateral adnexa are unremarkable. Other: No abdominal wall hernia or abnormality. No abdominopelvic ascites. Musculoskeletal: No acute or significant osseous findings. IMPRESSION: 1. Mild colitis involving the ascending and proximal to mid transverse colon. 2. Evidence of prior  cholecystectomy. Electronically Signed   By: Aram Candela M.D.   On: 10/04/2022 20:55    lidocaine (XYLOCAINE) 1 % (with pres) injection 1.5 mL     Date Action Dose Route User   Discharged on 10/04/2022   Admitted on 10/04/2022   08/30/2022 1027 Given 1.5 mL Other (Left Knee) Gearldine Bienenstock, PA-C      triamcinolone acetonide [facility]) injection 40 mg     Date Action Dose Route User   Discharged on 10/04/2022   Admitted on 10/04/2022   08/30/2022 1027 Given 40 mg Intra-articular (Left Knee) Gearldine Bienenstock, PA-C          Latest Ref Rng & Units 06/20/2022   11:05 AM 09/26/2019    2:56 PM 05/26/2015    2:59 PM  PFT Results  FVC-Pre L 1.25  P 1.25  1.78   FVC-Predicted Pre % 34  P 34  57   FVC-Post L 1.16  P 1.16  1.88   FVC-Predicted Post % 32  P 32  60   Pre FEV1/FVC % % 83  P 83  85   Post FEV1/FCV % % 87  P 87  88   FEV1-Pre L 1.04  P 1.04  1.51   FEV1-Predicted Pre % 36  P 36  59   FEV1-Post L 1.01  P 1.01  1.67   DLCO uncorrected ml/min/mmHg 6.93  P 6.93  9.41   DLCO UNC% % 32  P 32  36   DLCO corrected ml/min/mmHg 6.93  P 6.93  9.38   DLCO COR %Predicted % 32  P 32  36   DLVA Predicted % 67  P 67  73   TLC L 3.23  P 3.23  2.78   TLC % Predicted % 62  P 62  53   RV % Predicted % 77  P 77  44     P Preliminary result    No results found for: "NITRICOXIDE"      Assessment & Plan:  Sarcoidosis of lung (HCC) Progressive fibrotic pulmonary sarcoidosis with bronchiectasis.  Clinically patient has improved on steroids.  Along with triple therapy ICS/bronchodilators. Long discussion regarding chronic use of steroids.  Patient had a significant decline in lung function on PFTs in March.  Recent CT chest shows progressive fibrotic changes.  Will place patient on a steroid sparing agent with methotrexate.  Slowly wean off of steroids.  Will begin methotrexate 5 mg weekly increasing by 2.5 mg every 2 weeks up to maintenance dose of 10 mg weekly.  Patient education given  on methotrexate.  Advised of no alcohol usage.  Will also begin folic acid replacement.  Recent lab work with CBC and CMET , HEP C were reviewed and unrevealing.  Will need a Hep B test .  If negative can begin methotrexate.  She will slowly wean off of prednisone over the next few weeks. Continue with yearly eye exams Patient will return back in 4 to 6 weeks.   Plan  Patient Instructions  Begin Methotrexate 5mg  weekly, increase by 2.5mg  every 2 weeks until you get to 10mg  weekly.  Continue on Prednisone 10mg  daily for 2 week then decrease Prednisone 5mg  daily for 1 week and then every other day for 1 week and stop.  Begin Folic acid 1mg  daily.  Do not drink alcohol while taking.  Labs today.  Follow up in 6 weeks with Dr. Craige Cotta  or Kahlil Cowans NP and As needed.       Bronchiectasis (HCC) Clinically improved on Breztri.  Continue on mucociliary clearance regimen.  Plan  Patient Instructions  Begin Methotrexate 5mg  weekly, increase by 2.5mg  every 2 weeks until you get to 10mg  weekly.  Continue on Prednisone 10mg  daily for 2 week then decrease Prednisone 5mg  daily for 1 week and then every other day for 1 week and stop.  Begin Folic acid 1mg  daily.  Do not drink alcohol while taking.  Labs today.  Follow up in 6 weeks with Dr. Craige Cotta  or Timber Marshman NP and As needed.       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  Continue follow-up with hematology.  Endorses compliance on Xarelto.  Patient is occasion given on anticoagulation therapy  CKD (chronic kidney disease) Continue follow-up with nephrology  OSA (obstructive sleep apnea) Encouraged on CPAP compliance    I spent  47  minutes dedicated to the care of this patient on the date of this encounter to include pre-visit review of records, face-to-face time with the patient discussing conditions above, post visit ordering of testing, clinical documentation with the electronic health  record, making appropriate referrals as documented, and communicating necessary findings to members of the patients care team.   Rubye Oaks, NP 10/18/2022

## 2022-10-19 ENCOUNTER — Telehealth: Payer: Self-pay | Admitting: Adult Health

## 2022-10-19 DIAGNOSIS — G4733 Obstructive sleep apnea (adult) (pediatric): Secondary | ICD-10-CM | POA: Diagnosis not present

## 2022-10-19 LAB — BMP8+EGFR
BUN/Creatinine Ratio: 15 (ref 9–23)
BUN: 16 mg/dL (ref 6–24)
CO2: 24 mmol/L (ref 20–29)
Calcium: 9.1 mg/dL (ref 8.7–10.2)
Chloride: 106 mmol/L (ref 96–106)
Creatinine, Ser: 1.09 mg/dL — ABNORMAL HIGH (ref 0.57–1.00)
Glucose: 72 mg/dL (ref 70–99)
Potassium: 4 mmol/L (ref 3.5–5.2)
Sodium: 144 mmol/L (ref 134–144)
eGFR: 61 mL/min/{1.73_m2} (ref >59)

## 2022-10-19 LAB — CBC
Hematocrit: 40.6 % (ref 34.0–46.6)
Hemoglobin: 12.9 g/dL (ref 11.1–15.9)
MCH: 26.9 pg (ref 26.6–33.0)
MCHC: 31.8 g/dL (ref 31.5–35.7)
MCV: 85 fL (ref 79–97)
Platelets: 280 10*3/uL (ref 150–450)
RBC: 4.79 x10E6/uL (ref 3.77–5.28)
RDW: 14.6 % (ref 11.7–15.4)
WBC: 13.2 10*3/uL — ABNORMAL HIGH (ref 3.4–10.8)

## 2022-10-19 LAB — HEPATITIS B SURFACE ANTIGEN: Hepatitis B Surface Ag: NEGATIVE

## 2022-10-19 NOTE — Telephone Encounter (Signed)
[facility] paperwork dropped off left in box was just seen by NP Parrett

## 2022-10-20 NOTE — Progress Notes (Signed)
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       Continue wrist splint and if gets worse, contact us for NCV-EMG     Subjective:  Hailey Ross is a 52 year old left-handed female with sarcoidosis, HTN, CKD and history of PE on AC who follows up for dizziness and left arm numbness.     UPDATE: Current medication:  Reglan, olmesartan, lisinopril, diltiazem, prazosin, methotrexate, Xarelto  Started nortriptyline to treat both neuralgia and possible vestibular migraine.  No improvement.  She knows she started it but does not know why she stopped because she has had changes in a lot of medications recently  Repeat B12 and folate in June 2023 were 243 and 8.8 respectively.  Recommended starting OTC B12 daily.  She no longer is taking it.  Started wearing left wrist splint at night.  Improved.    HISTORY: She began having dizzy spells in November 2020.  It is not positional and would come on suddenly.  It is a sensation of feeling "loopy" and would last for a couple of seconds.  Sometimes she may see black spots but no tunnel vision or double vision.  Sometimes she may have palpitations.  No headache, nausea, diaphoresis, focal numbness or weakness.  Frequency varies. It may occur frequently for several days and she may have days to weeks without episodes.  In January 2021, she had a coughing spell that caused her to momentarily loss consciousness for a couple of seconds.  She did not fall but was holding a mug and the next thing she saw it on the floor.  There was some associated palpitations and tachycardia as well.  30 day cardiac event monitor in May 2021 showed no significant arrhythmias.  Echocardiogram in June 2021 showed EF 55-60%  with no wall motion abnormalities, mildly elevated PASP of 35.3 mmHg, mildly dilated LA and RA and mild to moderate tricuspid regurgitation.  She saw neurology in November 2021.  MRI of brain with and without contrast on 02/25/2020 personally reviewed showed a partially empty sella but overall unremarkable study.  Carotid ultrasound on 03/02/2020 showed no hemodynamically significant stenosis.  Etiology believed to be multifactorial, related to dehydration, low blood pressure, menstrual cycle and pulmonary sarcoidosis.  She also had been experiencing atypical chest pain and palpitations.  She has followed up with cardiology.  Repeat echo overall unchanged.  Repeat cardiac event monitor showed 11 runs of SVT, longest lasting 9 beats with maximum rate of 148 but no significant sustained arrhythmias.  Lisinopril was stopped due to low blood pressure, but no change in the dizziness.  No history of migraines.   She also reports numbness in the left hand and arm.  Mostly notices it laying in bed.  No pain or weakness.  For a few months, she also notes numbness and tingling in her toes.  She reports treated for B12 deficiency last year but not currently.  She also reports tinnitus.  Workup for episodic dizziness/lightheadedness:  EEG on 02/15/2021 was normal.  CTA of head was ordered but would not be covered by insurance.  No change.  May last few minutes off and on up to 2 weeks.  Maybe mild throbbing headache  Workup for left arm numbness:  NCV-EMG on 02/23/2021 showed evidence of mild  carpal tunnel syndrome.  Advised to wear wrist splint but she forgot to buy one.  Still feels the numbness at night.    Workup for neuropathy:  Labs from 01/22/2021 revealed low B12 and folate of 172 and 5.7 respectively.  Advised to start OTC B12 and folic acid 400mg  daily.  Numbness and tingling in feet are improved.  Other labs unremarkable, including TSH 1.17 and normal SPEP/IFE  PAST MEDICAL HISTORY: Past Medical  History:  Diagnosis Date   Acute cholecystitis 08/24/2016   Acute kidney injury superimposed on chronic kidney disease (HCC) 06/20/2019   Acute respiratory failure with hypoxia (HCC) 06/19/2019   Annual visit for general adult medical examination with abnormal findings 04/16/2019   Arthritis    right knee   Bronchiectasis (HCC)    CKD (chronic kidney disease)    Iron deficiency    Lightheadedness 05/10/2019   PE (pulmonary thromboembolism) (HCC) 06/2019   Pre-syncope 04/16/2019   Sarcoidosis    Sarcoidosis    Sleep apnea    c pap   Smoker 02/12/2014    MEDICATIONS: Current Outpatient Medications on File Prior to Visit  Medication Sig Dispense Refill   acetaminophen (TYLENOL) 500 MG tablet Take 500 mg by mouth every 4 (four) hours as needed for mild pain, moderate pain or headache.      albuterol (VENTOLIN HFA) 108 (90 Base) MCG/ACT inhaler Inhale 2 puffs into the lungs every 6 (six) hours as needed for wheezing or shortness of breath. 8 g 0   amoxicillin-clavulanate (AUGMENTIN) 875-125 MG tablet Take 1 tablet by mouth every 12 (twelve) hours. 14 tablet 0   Budeson-Glycopyrrol-Formoterol (BREZTRI AEROSPHERE) 160-9-4.8 MCG/ACT AERO Inhale 2 puffs into the lungs in the morning and at bedtime. 10.7 g 3   buPROPion (WELLBUTRIN XL) 150 MG 24 hr tablet Take 150 mg by mouth every morning.     diltiazem (CARDIZEM) 30 MG tablet Take 1 tablet (30 mg total) by mouth daily as needed (For palpitations). May take an additional 30 mg tablet daily as needed for palpitations 135 tablet 1   ezetimibe (ZETIA) 10 MG tablet Take 1 tablet (10 mg total) by mouth daily. 90 tablet 3   fluticasone (FLONASE) 50 MCG/ACT nasal spray Place 1 spray into both nostrils at bedtime. 16 g 2   folic acid (FOLVITE) 1 MG tablet Take 1 tablet (1 mg total) by mouth daily. 30 tablet 5   lisinopril (ZESTRIL) 5 MG tablet Take 1 tablet (5 mg total) by mouth daily. 90 tablet 1   LORazepam (ATIVAN) 0.5 MG tablet Take 0.5 mg by mouth daily  as needed.     methotrexate (RHEUMATREX) 2.5 MG tablet Take 2 tabs weekly for 2 weeks then increase to 3 tabs weekly for 2 weeks , then 4 tabs weekly . 16 tablet 5   metoCLOPramide (REGLAN) 5 MG tablet Take 1 tablet (5 mg total) by mouth 3 (three) times daily. Before meals and at bedtime 90 tablet 1   olmesartan (BENICAR) 5 MG tablet Take 5 mg by mouth daily.     pantoprazole (PROTONIX) 40 MG tablet TAKE 1 TABLET(40 MG) BY MOUTH DAILY 90 tablet 3   prazosin (MINIPRESS) 1 MG capsule 1 mg if needed     predniSONE (DELTASONE) 5 MG tablet 2 tabs daily for 2 weeks, then 1 tab daily for 1 week and then every other day for 1 week and stop . 28 tablet 0   senna-docusate (SENOKOT-S) 8.6-50 MG tablet Take 1 tablet by mouth  daily. 20 tablet 0   XARELTO 20 MG TABS tablet TAKE 1 TABLET(20 MG) BY MOUTH DAILY WITH SUPPER 90 tablet 3   zolpidem (AMBIEN) 10 MG tablet Take 10 mg by mouth at bedtime as needed.     No current facility-administered medications on file prior to visit.    ALLERGIES: No Known Allergies  FAMILY HISTORY: Family History  Problem Relation Age of Onset   Hypertension Mother    Cancer Mother    Colon polyps Mother    Healthy Sister    Healthy Sister    Healthy Sister    Healthy Sister    Healthy Son    Healthy Son    Healthy Daughter    Colon cancer Neg Hx    Esophageal cancer Neg Hx    Rectal cancer Neg Hx    Stomach cancer Neg Hx       Objective:  Blood pressure (!) 147/93, pulse (!) 59, height 5\' 5"  (1.651 m), weight 214 lb 9.6 oz (97.3 kg), last menstrual period 04/01/2021, SpO2 90%. General: No acute distress.  Patient appears well-groomed.   Head:  Normocephalic/atraumatic Neck:  Supple.  No paraspinal tenderness.  Full range of motion. Heart:  Regular rate and rhythm. Neuro:  Alert and oriented.  Speech fluent and not dysarthric.  Language intact.  CN II-XII intact.  Bulk and tone normal.  Muscle strength 5/5 throughout.  Deep tendon reflexes 2+ throughout.  Gait  normal.  Romberg negative.     Shon Millet, DO  CC: Edwin Dada, FNP

## 2022-10-21 ENCOUNTER — Encounter: Payer: Self-pay | Admitting: Neurology

## 2022-10-21 ENCOUNTER — Ambulatory Visit: Payer: BC Managed Care – PPO | Admitting: Neurology

## 2022-10-21 ENCOUNTER — Other Ambulatory Visit (HOSPITAL_BASED_OUTPATIENT_CLINIC_OR_DEPARTMENT_OTHER): Payer: Self-pay | Admitting: Pulmonary Disease

## 2022-10-21 ENCOUNTER — Telehealth: Payer: Self-pay

## 2022-10-21 VITALS — BP 147/93 | HR 59 | Ht 65.0 in | Wt 214.6 lb

## 2022-10-21 DIAGNOSIS — R200 Anesthesia of skin: Secondary | ICD-10-CM | POA: Diagnosis not present

## 2022-10-21 DIAGNOSIS — R202 Paresthesia of skin: Secondary | ICD-10-CM | POA: Diagnosis not present

## 2022-10-21 DIAGNOSIS — R42 Dizziness and giddiness: Secondary | ICD-10-CM | POA: Diagnosis not present

## 2022-10-31 ENCOUNTER — Telehealth: Payer: Self-pay | Admitting: Adult Health

## 2022-10-31 NOTE — Telephone Encounter (Signed)
Request for medical records from Social [employer] was rec'd via fax on 5/9 - I faxed 79 pages of medical records and test results to fax# [phone].   I also faxed those same 79 pages of notes to [employer] disability - fax# [phone].  I called the patient this morning and verified she has been out of work since 05/20/2020.   I filled in the [employer] disability form that the patient dropped off on 7/10 and have given it to Rubye Oaks, NP for signature.  Once the form is signed it will be faxed to [employer] at the above fax#.  The patient will be contacted so she can come pick up a copy of the form at the [employer].

## 2022-10-31 NOTE — Telephone Encounter (Signed)
I have faxed the disability form to [employer] & rec'd a fax confirm.  I spoke to pt to make aware & advised a copy is waiting for the patient at the front desk.  Pt verbalized understanding & nothing further needed at [facility] time.

## 2022-11-07 ENCOUNTER — Ambulatory Visit: Payer: BC Managed Care – PPO | Admitting: Family Medicine

## 2022-11-08 ENCOUNTER — Encounter: Payer: Self-pay | Admitting: Nurse Practitioner

## 2022-11-08 ENCOUNTER — Ambulatory Visit: Payer: BC Managed Care – PPO | Attending: Nurse Practitioner | Admitting: Nurse Practitioner

## 2022-11-08 VITALS — BP 126/66 | HR 84 | Ht 65.0 in | Wt 214.8 lb

## 2022-11-08 DIAGNOSIS — Z87898 Personal history of other specified conditions: Secondary | ICD-10-CM | POA: Diagnosis not present

## 2022-11-08 DIAGNOSIS — I272 Pulmonary hypertension, unspecified: Secondary | ICD-10-CM

## 2022-11-08 DIAGNOSIS — R42 Dizziness and giddiness: Secondary | ICD-10-CM | POA: Diagnosis not present

## 2022-11-08 DIAGNOSIS — Z86711 Personal history of pulmonary embolism: Secondary | ICD-10-CM

## 2022-11-08 DIAGNOSIS — R079 Chest pain, unspecified: Secondary | ICD-10-CM | POA: Diagnosis not present

## 2022-11-08 DIAGNOSIS — J479 Bronchiectasis, uncomplicated: Secondary | ICD-10-CM

## 2022-11-08 DIAGNOSIS — E785 Hyperlipidemia, unspecified: Secondary | ICD-10-CM

## 2022-11-08 DIAGNOSIS — D860 Sarcoidosis of lung: Secondary | ICD-10-CM

## 2022-11-08 DIAGNOSIS — I7121 Aneurysm of the ascending aorta, without rupture: Secondary | ICD-10-CM

## 2022-11-08 DIAGNOSIS — G4733 Obstructive sleep apnea (adult) (pediatric): Secondary | ICD-10-CM

## 2022-11-08 DIAGNOSIS — N1831 Chronic kidney disease, stage 3a: Secondary | ICD-10-CM

## 2022-11-08 NOTE — Patient Instructions (Addendum)
Medication Instructions:  Your physician recommends that you continue on your current medications as directed. Please refer to the Current Medication list given to you today.  Labwork: None  Testing/Procedures: Your physician has requested that you have an exercise tolerance test. For further information please visit [email or web address] Please also follow instruction sheet, as given. Your physician has requested that you have an echocardiogram. Echocardiography is a painless test that uses sound waves to create images of your heart. It provides your doctor with information about the size and shape of your heart and how well your heart's chambers and valves are working. This procedure takes approximately one hour. There are no restrictions for this procedure. Please do NOT wear cologne, perfume, aftershave, or lotions (deodorant is allowed). Please arrive 15 minutes prior to your appointment time.  Follow-Up: Your physician recommends that you schedule a follow-up appointment in: 6 Weeks with Philis Nettle   Any Other Special Instructions Will Be Listed Below (If Applicable).  If you need a refill on your cardiac medications before your next appointment, please call your pharmacy.

## 2022-11-08 NOTE — Progress Notes (Signed)
Office Visit    Patient Name: Hailey Ross Date of Encounter: 11/08/2022 PCP:  Gilmore Laroche, FNP [facility]  Cardiologist:  Dina Rich, MD  Advanced Practice Provider:  No care team member to display Electrophysiologist:  None    Chief Complaint and HPI    Hailey Ross is a very pleasant 52 y.o. female with a hx of palpitations, sarcoidosis/bronchiectasis, CKD stage 3a (sees Dr. Wolfgang Phoenix), thoracic ascending aortic aneurysm (noted on CT scan 09/2022), chronic hx of lightheadedness, pulmonary HTN, OSA on CPAP, history of unprovoked PE, and hyperlipidemia, who presents today for chest pain/dizziness evaluation.    30-day event monitor in 2021 revealed no significant arrhythmias, was started on diltiazem 30 mg twice daily for symptoms of palpitations.  History of bilateral PEs found in 2021, repeat CT PE 1 month later showed resolution of PEs.  Repeat monitor in 2022 revealed rare PACs, rare episodes of SVT up to 12 beats, rare PVCs.  History of lightheadedness, seen by neurology.  EEG normal, MRI and carotid Doppler benign.  Closely followed by pulmonary for history of sarcoidosis/bronchiectasis.   Last seen by Dr. Dina Rich on July 06, 2021.  Was instructed that she could take an additional 30 mg of diltiazem as needed for palpitations.  Underwent orthostatics for lightheadedness, negative.  Recommended to follow-up with neurology and stated if no clear etiology found, to consider trial of midodrine.  I last saw her scheduled follow-up on October 03, 2022. Had a recent chest CT scan performed, was told to follow-up with Cardiology. Continued to admit to longstanding hx of lightheadedness since 2021. Described as intermittent feeling of lightheadedness and noted fatigue. Admitted to intermittent chest pain (4/10) for a while, described as tightness along left upper anterior chest wall, lasting a few seconds in duration, and not bothersome per her  report, occasional left upper extremity numbness, noted sleeping on left side. Admitted to being noncompliant with CPAP but was planning on resuming this.   She contacted our office last week noting chest pain. She presents to our office today for evaluation. She states this started 2 weeks ago, better recently than last week. Describes CP as an intermittent, dull burning sensation along left breast that she describes as subtle. She also describes a sensation along her left shoulder and neck. Saw her Neurologist 2 weeks ago for her longstanding history of lightheadedness who told her it is coming from her heart. Says she is feeling frustrated as she is not sure what is causing her symptoms. She has intermittent lightheadedness mainly noticed with extended periods of standing/walking. BP has been labile recently. Denies any worsening shortness of breath, recent palpitations, syncope, presyncope, dizziness, orthopnea, PND, swelling or significant weight changes, acute bleeding, or claudication.  SH: Husband is also a patient of mine.   EKGs/Labs/Other Studies Reviewed:   The following studies were reviewed today:   EKG:  EKG is not ordered today.    CT chest 09/21/2022:  IMPRESSION: 1. Chronic imaging stigmata compatible with reported clinical history of sarcoidosis, with mild progression compared to prior study from 08/13/2021, as above. 2. Dilatation of the pulmonic trunk (4.2 cm in diameter), concerning for pulmonary arterial hypertension. 3. Aortic atherosclerosis with mild aneurysmal dilatation of the ascending thoracic aorta (4.5 cm in diameter).   Aortic Atherosclerosis (ICD10-I70.0). Aortic aneurysm NOS (ICD10-I71.9).  Myoview 03/2021:    Lexiscan stress is electrically negative for ischemia   Myoview scan with probable normal perfusion and mild soft tissue attenuaton (  breast)   No significant ischemia or scar   LVEF is 67% with normal wall motion.   Low risk study   Monitor  12/2020:  14 day monitor Rare supraventricular ectopy in the form of isolated PACs, coupletes, triplets. Rare episodes of SVT up to 12 beats. Rare ventricular ectopy in the form of isolated PVCs, couplets Reported symptoms correlate with sinus rhythm     Patch Wear Time:  13 days and 23 hours (2022-06-14T11:36:38-0400 to 2022-06-28T10:57:53-0400)   Patient had a min HR of 45 bpm, max HR of 151 bpm, and avg HR of 73 bpm. Predominant underlying rhythm was Sinus Rhythm. 11 Supraventricular Tachycardia runs occurred, the run with the fastest interval lasting 9 beats with a max rate of 148 bpm, the  longest lasting 12 beats with an avg rate of 122 bpm. Isolated SVEs were rare (<1.0%), SVE Couplets were rare (<1.0%), and SVE Triplets were rare (<1.0%). Isolated VEs were rare (<1.0%), VE Couplets were rare (<1.0%), and no VE Triplets were present.   Echo 10/2020:  1. Left ventricular ejection fraction, by estimation, is 55 to 60%. The  left ventricle has normal function. The left ventricle has no regional  wall motion abnormalities. Left ventricular diastolic parameters were  normal. Normal global longitudinal  strain of -20.3%.   2. Right ventricular systolic function is normal. The right ventricular  size is normal. There is normal pulmonary artery systolic pressure. The  estimated right ventricular systolic pressure is 35.5 mmHg.   3. The mitral valve is grossly normal. Trivial mitral valve  regurgitation.   4. The aortic valve is tricuspid. Aortic valve regurgitation is not  visualized.   5. The inferior vena cava is normal in size with greater than 50%  respiratory variability, suggesting right atrial pressure of 3 mmHg.   Comparison(s): Echocardiogram done 09/13/19 showed an EF of 55-60%.   Risk Assessment/Calculations:   The 10-year ASCVD risk score (Arnett DK, et al., 2019) is: 3%   Values used to calculate the score:     Age: 57 years     Sex: Female     Is Non-Hispanic African  American: Yes     Diabetic: No     Tobacco smoker: No     Systolic Blood Pressure: 126 mmHg     Is BP treated: Yes     HDL Cholesterol: 72 mg/dL     Total Cholesterol: 221 mg/dL     Review of Systems    All other systems reviewed and are otherwise negative except as noted above.  Physical Exam    VS:  BP 126/66   Pulse 84   Ht 5\' 5"  (1.651 m)   Wt 214 lb 12.8 oz (97.4 kg)   LMP 04/01/2021 (Exact Date)   SpO2 94%   BMI 35.74 kg/m  , BMI Body mass index is 35.74 kg/m.  Wt Readings from Last 3 Encounters:  11/08/22 214 lb 12.8 oz (97.4 kg)  10/21/22 214 lb 9.6 oz (97.3 kg)  10/18/22 213 lb 9.6 oz (96.9 kg)     GEN: Well nourished, well developed, in no acute distress. HEENT: normal. Neck: Supple, no JVD, carotid bruits, or masses. Cardiac: S1/S2, RRR, no murmurs, rubs, or gallops. No clubbing, cyanosis, edema.  Radials/PT 2+ and equal bilaterally.  Respiratory:  Respirations regular and unlabored, clear to auscultation bilaterally. GI: Soft, nontender, nondistended. MS: No deformity or atrophy. Skin: Warm and dry, no rash. Neuro:  Strength and sensation are intact. Psych: Normal affect.  Assessment & Plan    Chest pain of uncertain etiology Some atypical features of her chest pain. Past NST in 2022 negative. No definite coronary artery calcifications noted on recent chest CT. Past EKG reassuring from last visit. Denies any active CP. Discussed ischemic evaluation including risks and benefits of ETT, and she verbalized understanding and is agreeable to proceed. Will update Echocardiogram to evaluate structure and function of heart and to evaluate for any wall motion abnormalities.  No medication changes at this time. Heart healthy diet and regular cardiovascular exercise encouraged. ED precautions discussed.  Informed Consent   Shared Decision Making/Informed Consent The risks [chest pain, shortness of breath, cardiac arrhythmias, dizziness, blood pressure fluctuations,  myocardial infarction, stroke/transient ischemic attack, and life-threatening complications (estimated to be 1 in 10,000)], benefits (risk stratification, diagnosing coronary artery disease, treatment guidance) and alternatives of an exercise tolerance test were discussed in detail with Ms. [place] and she agrees to proceed.  Hx of palpitations Denies any recent palpitations. Previously instructed patient to take Diltiazem 30 mg daily PRN for palpitations. Heart healthy diet and regular cardiovascular exercise encouraged.   Lightheadedness Longstanding history. Past orthostatics negative in office. Follow-up with Neurology as scheduled. Arranging ETT and Echo as mentioned above. If testing unremarkable, agree with Dr. Verna Czech recommendation to consider future trial of midodrine to see if this relieves symptoms.   Pulmonary HTN, Sarcoidosis/Bronchiectasis, hx of unprovoked PE, OSA Denies any worsening symptoms. Continue to follow-up with Dr. Craige Cotta. Continue Xarelto, denies any bleeding issues, on appropriate dosage. Encouraged compliance with CPAP machine.   HLD LDL 138 08/2022. Managed by PCP. Recommend increasing Crestor to 20 mg daily. Will route note to PCP. Heart healthy diet and regular cardiovascular exercise encouraged.   6. Thoracic ascending aortic aneurysm Noted on chest CT 09/2022. Recommend repeating CT imaging in 6 months for monitoring. Care precautions and ED precautions discussed.   7. CKD stage 3a Most recent labs show stable kidney function. Avoid nephrotoxic agents. Continue to follow-up with Nephrology and PCP.   Disposition: Follow up in 6 weeks with Dina Rich, MD or APP.  Signed, Sharlene Dory, NP 11/08/2022, 12:12 PM [facility]

## 2022-11-16 ENCOUNTER — Telehealth: Payer: Self-pay | Admitting: Nurse Practitioner

## 2022-11-16 ENCOUNTER — Ambulatory Visit (HOSPITAL_COMMUNITY)
Admission: RE | Admit: 2022-11-16 | Discharge: 2022-11-16 | Disposition: A | Discharge status: Home / Self Care | Payer: BC Managed Care – PPO | Source: Ambulatory Visit | Attending: Nurse Practitioner | Admitting: Nurse Practitioner

## 2022-11-16 DIAGNOSIS — R079 Chest pain, unspecified: Secondary | ICD-10-CM | POA: Insufficient documentation

## 2022-11-16 LAB — EXERCISE TOLERANCE TEST
Angina Index: 0
Base ST Depression (mm): 0 mm
Duke Treadmill Score: 3
Estimated workload: 4.6
Exercise duration (min): 2 min
Exercise duration (sec): 53 s
MPHR: 168 {beats}/min
Peak HR: 122 {beats}/min
Percent HR: 72 %
RPE: 13
Rest HR: 65 {beats}/min
ST Depression (mm): 0 mm

## 2022-11-16 NOTE — Telephone Encounter (Signed)
Called and s/w patient at 1730 today and reviewed ETT. Pt states she is doing well. Could not complete ETT d/t dyspnea. Discussed Coronary CTA for further ischemic evaluation and she is agreeable to proceed. BMET 4 weeks ago shows stable kidney function. Will write one time prescription for Metoprolol Tartrate to take 2 hours prior to testing. Patient verbalized understanding of phone conversation and was appreciative of my call.   Sharlene Dory, NP

## 2022-11-17 ENCOUNTER — Other Ambulatory Visit: Payer: Self-pay

## 2022-11-17 ENCOUNTER — Other Ambulatory Visit: Payer: BC Managed Care – PPO

## 2022-11-17 DIAGNOSIS — R079 Chest pain, unspecified: Secondary | ICD-10-CM

## 2022-11-17 MED ORDER — METOPROLOL TARTRATE 100 MG PO TABS: ORAL_TABLET | ORAL | 0 refills | Status: DC

## 2022-11-19 DIAGNOSIS — G4733 Obstructive sleep apnea (adult) (pediatric): Secondary | ICD-10-CM | POA: Diagnosis not present

## 2022-11-22 ENCOUNTER — Ambulatory Visit (HOSPITAL_COMMUNITY)
Admission: RE | Admit: 2022-11-22 | Discharge: 2022-11-22 | Disposition: A | Discharge status: Home / Self Care | Payer: BC Managed Care – PPO | Source: Ambulatory Visit | Attending: Nurse Practitioner | Admitting: Nurse Practitioner

## 2022-11-22 DIAGNOSIS — R079 Chest pain, unspecified: Secondary | ICD-10-CM | POA: Insufficient documentation

## 2022-11-22 DIAGNOSIS — R072 Precordial pain: Secondary | ICD-10-CM

## 2022-11-22 MED ORDER — IOHEXOL 350 MG/ML SOLN
95.0000 mL | Freq: Once | INTRAVENOUS | Status: AC | PRN
  Administered 2022-11-22: 95 mL via INTRAVENOUS

## 2022-11-22 MED ORDER — NITROGLYCERIN 0.4 MG SL SUBL
SUBLINGUAL_TABLET | SUBLINGUAL | Status: AC
  Filled 2022-11-22: qty 2

## 2022-11-22 MED ORDER — NITROGLYCERIN 0.4 MG SL SUBL
0.8000 mg | SUBLINGUAL_TABLET | Freq: Once | SUBLINGUAL | Status: AC
  Administered 2022-11-22: 0.8 mg via SUBLINGUAL

## 2022-11-29 ENCOUNTER — Encounter: Payer: Self-pay | Admitting: Gastroenterology

## 2022-11-29 ENCOUNTER — Ambulatory Visit: Payer: BC Managed Care – PPO | Attending: Nurse Practitioner

## 2022-11-29 ENCOUNTER — Ambulatory Visit: Payer: BC Managed Care – PPO | Admitting: Gastroenterology

## 2022-11-29 VITALS — BP 146/90 | HR 62 | Ht 65.0 in | Wt 216.0 lb

## 2022-11-29 DIAGNOSIS — K3184 Gastroparesis: Secondary | ICD-10-CM | POA: Diagnosis not present

## 2022-11-29 DIAGNOSIS — R079 Chest pain, unspecified: Secondary | ICD-10-CM

## 2022-11-29 MED ORDER — METOCLOPRAMIDE HCL 5 MG PO TABS: 5.0000 mg | ORAL_TABLET | Freq: Two times a day (BID) | ORAL | 5 refills | Status: DC

## 2022-11-29 NOTE — Progress Notes (Signed)
Noted  

## 2022-11-29 NOTE — Patient Instructions (Signed)
We have sent the following medications to your pharmacy for you to pick up at your convenience: Reglan 5 mg twice daily.   _______________________________________________________  If your blood pressure at your visit was 140/90 or greater, please contact your primary care physician to follow up on this.  _______________________________________________________  If you are age 52 or older, your body mass index should be between 23-30. Your Body mass index is 35.94 kg/m. If this is out of the aforementioned range listed, please consider follow up with your Primary Care Provider.  If you are age 53 or younger, your body mass index should be between 19-25. Your Body mass index is 35.94 kg/m. If this is out of the aformentioned range listed, please consider follow up with your Primary Care Provider.   ________________________________________________________  The [place] [facility] providers would like to encourage you to use [facility] to communicate with providers for non-urgent requests or questions.  Due to long hold times on the telephone, sending your provider a message by [facility] may be a faster and more efficient way to get a response.  Please allow 48 business hours for a response.  Please remember that this is for non-urgent requests.  _______________________________________________________

## 2022-11-29 NOTE — Progress Notes (Signed)
11/29/2022 Hailey Ross [id number] 07/08/1970   HISTORY OF PRESENT ILLNESS: This is a 52 year old female who is a patient of Dr. Lamar Sprinkles.  She follows here for issues with gastroparesis and abdominal pain.  Gastric emptying scan was abnormal and findings compatible with delayed gastric emptying with only 47% emptying at 4 hours with normal being greater than or equal to 90%.  At her last visit with me in March I had started her Reglan 5 mg twice daily.  She says she has been doing about the same.  She is not significantly distressed by her symptoms at this point.  Upper abdominal pain comes and goes, about a 5 out of 10 on the pain scale.  No significant nausea or vomiting.  She was having some stool leakage and I recommended Benefiber and then asked her to increase it to twice daily.  She had an episode back in June where she got constipated and we recommended a bowel purge.  Then she went to the ED with rectal bleeding abdominal pain and CT scan showed some colitis.  She never had diarrhea.  I gave her antibiotics, which she completed.  We thought maybe she induced some colitis with the bowel purge.  She said that the episode of constipation really came out of nowhere.  Now she is back to pretty much her baseline.  She is only using the Benefiber once daily.  EGD 09/2019:   1. Nonspecific gastric erythema. 2. GERD   Negative Hpylori.   Colonoscopy 09/2019 was normal.  Past Medical History:  Diagnosis Date   Acute cholecystitis 08/24/2016   Acute kidney injury superimposed on chronic kidney disease (HCC) 06/20/2019   Acute respiratory failure with hypoxia (HCC) 06/19/2019   Annual visit for general adult medical examination with abnormal findings 04/16/2019   Arthritis    right knee   Bronchiectasis (HCC)    CKD (chronic kidney disease)    Iron deficiency    Lightheadedness 05/10/2019   PE (pulmonary thromboembolism) (HCC) 06/2019   Pre-syncope 04/16/2019   Sarcoidosis    Sarcoidosis     Sleep apnea    c pap   Smoker 02/12/2014   Past Surgical History:  Procedure Laterality Date   CHOLECYSTECTOMY N/A 08/25/2016   Procedure: LAPAROSCOPIC CHOLECYSTECTOMY;  Surgeon: Abigail Miyamoto, MD;  Location: [facility] OR;  Service: General;  Laterality: N/A;   TUBAL LIGATION  02/1993    reports that she quit smoking about 7 years ago. Her smoking use included cigarettes. She started smoking about 27 years ago. She has a 20 pack-year smoking history. She has been exposed to tobacco smoke. She has never used smokeless tobacco. She reports that she does not currently use alcohol. She reports that she does not use drugs. family history includes Cancer in her mother; Colon polyps in her mother; Healthy in her daughter, sister, sister, sister, sister, son, and son; Hypertension in her mother. No Known Allergies    Outpatient Encounter Medications as of 11/29/2022  Medication Sig   acetaminophen (TYLENOL) 500 MG tablet Take 500 mg by mouth every 4 (four) hours as needed for mild pain, moderate pain or headache.    albuterol (VENTOLIN HFA) 108 (90 Base) MCG/ACT inhaler Inhale 2 puffs into the lungs every 6 (six) hours as needed for wheezing or shortness of breath.   Budeson-Glycopyrrol-Formoterol (BREZTRI AEROSPHERE) 160-9-4.8 MCG/ACT AERO Inhale 2 puffs into the lungs in the morning and at bedtime.   diltiazem (CARDIZEM) 30 MG tablet Take 1 tablet (  30 mg total) by mouth daily as needed (For palpitations). May take an additional 30 mg tablet daily as needed for palpitations   ezetimibe (ZETIA) 10 MG tablet Take 1 tablet (10 mg total) by mouth daily.   fluticasone (FLONASE) 50 MCG/ACT nasal spray Place 1 spray into both nostrils at bedtime.   folic acid (FOLVITE) 1 MG tablet Take 1 tablet (1 mg total) by mouth daily.   lisinopril (ZESTRIL) 5 MG tablet Take 1 tablet (5 mg total) by mouth daily.   LORazepam (ATIVAN) 0.5 MG tablet Take 0.5 mg by mouth daily as needed.   methotrexate (RHEUMATREX) 2.5 MG  tablet Take 2 tabs weekly for 2 weeks then increase to 3 tabs weekly for 2 weeks , then 4 tabs weekly .   metoCLOPramide (REGLAN) 5 MG tablet Take 1 tablet (5 mg total) by mouth 3 (three) times daily. Before meals and at bedtime   metoprolol tartrate (LOPRESSOR) 100 MG tablet Take 1 tablet 2 hours prior to CT Scan   olmesartan (BENICAR) 5 MG tablet Take 5 mg by mouth daily.   pantoprazole (PROTONIX) 40 MG tablet TAKE 1 TABLET(40 MG) BY MOUTH DAILY   prazosin (MINIPRESS) 1 MG capsule 1 mg if needed   predniSONE (DELTASONE) 5 MG tablet 2 tabs daily for 2 weeks, then 1 tab daily for 1 week and then every other day for 1 week and stop .   senna-docusate (SENOKOT-S) 8.6-50 MG tablet Take 1 tablet by mouth daily.   XARELTO 20 MG TABS tablet TAKE 1 TABLET(20 MG) BY MOUTH DAILY WITH SUPPER   zolpidem (AMBIEN) 10 MG tablet Take 10 mg by mouth at bedtime as needed.   No facility-administered encounter medications on file as of 11/29/2022.     REVIEW OF SYSTEMS  : All other systems reviewed and negative except where noted in the History of Present Illness.   PHYSICAL EXAM: BP (!) 146/90   Pulse 62   Ht 5\' 5"  (1.651 m)   Wt 216 lb (98 kg)   LMP 04/01/2021 (Exact Date)   BMI 35.94 kg/m  General: Well developed female in no acute distress Head: Normocephalic and atraumatic Eyes:  Sclerae anicteric, conjunctiva pink. Ears: Normal auditory acuity Lungs: Clear throughout to auscultation; no W/R/R. Heart: Regular rate and rhythm; no M/R/G. Abdomen: Soft, non-distended.  BS present.  Non-tender. Musculoskeletal: Symmetrical with no gross deformities  Skin: No lesions on visible extremities Extremities: No edema  Neurological: Alert oriented x 4, grossly non-focal Psychological:  Alert and cooperative. Normal mood and affect  ASSESSMENT AND PLAN: *Upper to mid abdominal pain and early satiety: GES showed delayed gastric emptying.  Is currently taking Reglan 5 mg twice daily.  She says that she is  doing about the same.  Is not significantly distressed by her symptoms at this point.  Will just plan to leave the dosing the same.  New prescription sent to pharmacy.  Could consider trying to have her restart her nortriptyline every night and increase the dose of that or since she does not really use that even try amitriptyline or mirtazapine instead if she continues with primary complaints of abdominal pain.  Will plan to see her back in about 6 months. *Stool leakage: Is using Benefiber powder daily.  I had recommended trying to increase it to BID, which she said that she did for a while but is back to once daily.  Doing ok with it this way.  CC:  Gilmore Laroche, FNP

## 2022-11-30 LAB — ECHOCARDIOGRAM COMPLETE
AR max vel: 2.66 cm2
AV Area VTI: 2.46 cm2
AV Area mean vel: 2.36 cm2
AV Mean grad: 5 mmHg
AV Peak grad: 9.6 mmHg
Ao pk vel: 1.55 m/s
Area-P 1/2: 3.26 cm2
Calc EF: 63.4 %
Height: 65 in
MV VTI: 2.75 cm2
S' Lateral: 2.9 cm
Single Plane A2C EF: 61.1 %
Single Plane A4C EF: 64 %
Weight: 3456 [oz_av]

## 2022-12-05 ENCOUNTER — Ambulatory Visit
Admission: RE | Admit: 2022-12-05 | Discharge: 2022-12-05 | Disposition: A | Discharge status: Home / Self Care | Payer: BC Managed Care – PPO | Source: Ambulatory Visit

## 2022-12-05 ENCOUNTER — Ambulatory Visit: Payer: BC Managed Care – PPO | Admitting: Family Medicine

## 2022-12-05 ENCOUNTER — Emergency Department (HOSPITAL_BASED_OUTPATIENT_CLINIC_OR_DEPARTMENT_OTHER): Payer: BC Managed Care – PPO

## 2022-12-05 ENCOUNTER — Ambulatory Visit (INDEPENDENT_AMBULATORY_CARE_PROVIDER_SITE_OTHER): Payer: BC Managed Care – PPO

## 2022-12-05 ENCOUNTER — Other Ambulatory Visit: Payer: Self-pay

## 2022-12-05 ENCOUNTER — Encounter (HOSPITAL_BASED_OUTPATIENT_CLINIC_OR_DEPARTMENT_OTHER): Payer: Self-pay

## 2022-12-05 ENCOUNTER — Observation Stay (HOSPITAL_BASED_OUTPATIENT_CLINIC_OR_DEPARTMENT_OTHER)
Admission: EM | Admit: 2022-12-05 | Discharge: 2022-12-06 | Disposition: A | Discharge status: Home / Self Care | Payer: BC Managed Care – PPO | Attending: Internal Medicine | Admitting: Internal Medicine

## 2022-12-05 VITALS — BP 119/82 | HR 98 | Temp 99.7°F | Resp 16

## 2022-12-05 DIAGNOSIS — N1831 Chronic kidney disease, stage 3a: Secondary | ICD-10-CM | POA: Insufficient documentation

## 2022-12-05 DIAGNOSIS — Z87891 Personal history of nicotine dependence: Secondary | ICD-10-CM | POA: Diagnosis not present

## 2022-12-05 DIAGNOSIS — R0902 Hypoxemia: Secondary | ICD-10-CM | POA: Diagnosis not present

## 2022-12-05 DIAGNOSIS — I129 Hypertensive chronic kidney disease with stage 1 through stage 4 chronic kidney disease, or unspecified chronic kidney disease: Secondary | ICD-10-CM | POA: Diagnosis not present

## 2022-12-05 DIAGNOSIS — Z79899 Other long term (current) drug therapy: Secondary | ICD-10-CM | POA: Diagnosis not present

## 2022-12-05 DIAGNOSIS — Z7901 Long term (current) use of anticoagulants: Secondary | ICD-10-CM | POA: Insufficient documentation

## 2022-12-05 DIAGNOSIS — Z862 Personal history of diseases of the blood and blood-forming organs and certain disorders involving the immune mechanism: Secondary | ICD-10-CM | POA: Insufficient documentation

## 2022-12-05 DIAGNOSIS — J9601 Acute respiratory failure with hypoxia: Secondary | ICD-10-CM | POA: Diagnosis not present

## 2022-12-05 DIAGNOSIS — Z1152 Encounter for screening for COVID-19: Secondary | ICD-10-CM | POA: Insufficient documentation

## 2022-12-05 DIAGNOSIS — R059 Cough, unspecified: Secondary | ICD-10-CM | POA: Diagnosis not present

## 2022-12-05 DIAGNOSIS — R0602 Shortness of breath: Secondary | ICD-10-CM | POA: Diagnosis not present

## 2022-12-05 DIAGNOSIS — I2699 Other pulmonary embolism without acute cor pulmonale: Secondary | ICD-10-CM | POA: Diagnosis present

## 2022-12-05 DIAGNOSIS — R040 Epistaxis: Secondary | ICD-10-CM | POA: Diagnosis present

## 2022-12-05 DIAGNOSIS — R058 Other specified cough: Secondary | ICD-10-CM | POA: Diagnosis not present

## 2022-12-05 DIAGNOSIS — K219 Gastro-esophageal reflux disease without esophagitis: Secondary | ICD-10-CM | POA: Diagnosis present

## 2022-12-05 DIAGNOSIS — R918 Other nonspecific abnormal finding of lung field: Secondary | ICD-10-CM | POA: Diagnosis not present

## 2022-12-05 DIAGNOSIS — Z86711 Personal history of pulmonary embolism: Secondary | ICD-10-CM | POA: Insufficient documentation

## 2022-12-05 DIAGNOSIS — J22 Unspecified acute lower respiratory infection: Secondary | ICD-10-CM | POA: Insufficient documentation

## 2022-12-05 DIAGNOSIS — D869 Sarcoidosis, unspecified: Secondary | ICD-10-CM | POA: Diagnosis present

## 2022-12-05 DIAGNOSIS — E785 Hyperlipidemia, unspecified: Secondary | ICD-10-CM | POA: Diagnosis present

## 2022-12-05 DIAGNOSIS — J0110 Acute frontal sinusitis, unspecified: Secondary | ICD-10-CM | POA: Diagnosis present

## 2022-12-05 DIAGNOSIS — I10 Essential (primary) hypertension: Secondary | ICD-10-CM | POA: Diagnosis present

## 2022-12-05 LAB — COMPREHENSIVE METABOLIC PANEL
ALT: 27 U/L (ref 0–44)
AST: 32 U/L (ref 15–41)
Albumin: 4 g/dL (ref 3.5–5.0)
Alkaline Phosphatase: 89 U/L (ref 38–126)
Anion gap: 12 (ref 5–15)
BUN: 17 mg/dL (ref 6–20)
CO2: 22 mmol/L (ref 22–32)
Calcium: 10 mg/dL (ref 8.9–10.3)
Chloride: 100 mmol/L (ref 98–111)
Creatinine, Ser: 1.28 mg/dL — ABNORMAL HIGH (ref 0.44–1.00)
GFR, Estimated: 50 mL/min — ABNORMAL LOW (ref >60)
Glucose, Bld: 149 mg/dL — ABNORMAL HIGH (ref 70–99)
Potassium: 4.1 mmol/L (ref 3.5–5.1)
Sodium: 134 mmol/L — ABNORMAL LOW (ref 135–145)
Total Bilirubin: 0.7 mg/dL (ref 0.3–1.2)
Total Protein: 7.5 g/dL (ref 6.5–8.1)

## 2022-12-05 LAB — CBC WITH DIFFERENTIAL/PLATELET
Abs Immature Granulocytes: 0.2 10*3/uL — ABNORMAL HIGH (ref 0.00–0.07)
Basophils Absolute: 0 10*3/uL (ref 0.0–0.1)
Basophils Relative: 0 %
Eosinophils Absolute: 0 10*3/uL (ref 0.0–0.5)
Eosinophils Relative: 0 %
HCT: 36.8 % (ref 36.0–46.0)
Hemoglobin: 12.1 g/dL (ref 12.0–15.0)
Immature Granulocytes: 1 %
Lymphocytes Relative: 13 %
Lymphs Abs: 2.3 10*3/uL (ref 0.7–4.0)
MCH: 27.6 pg (ref 26.0–34.0)
MCHC: 32.9 g/dL (ref 30.0–36.0)
MCV: 84 fL (ref 80.0–100.0)
Monocytes Absolute: 0.4 10*3/uL (ref 0.1–1.0)
Monocytes Relative: 2 %
Neutro Abs: 15.5 10*3/uL — ABNORMAL HIGH (ref 1.7–7.7)
Neutrophils Relative %: 84 %
Platelets: 338 10*3/uL (ref 150–400)
RBC: 4.38 MIL/uL (ref 3.87–5.11)
RDW: 13.7 % (ref 11.5–15.5)
WBC: 18.5 10*3/uL — ABNORMAL HIGH (ref 4.0–10.5)
nRBC: 0 % (ref 0.0–0.2)

## 2022-12-05 LAB — RESP PANEL BY RT-PCR (RSV, FLU A&B, COVID)  RVPGX2
Influenza A by PCR: NEGATIVE
Influenza B by PCR: NEGATIVE
Resp Syncytial Virus by PCR: NEGATIVE
SARS Coronavirus 2 by RT PCR: NEGATIVE

## 2022-12-05 MED ORDER — PROMETHAZINE-DM 6.25-15 MG/5ML PO SYRP: 5.0000 mL | ORAL_SOLUTION | Freq: Four times a day (QID) | ORAL | 0 refills | Status: DC | PRN

## 2022-12-05 MED ORDER — AMOXICILLIN-POT CLAVULANATE 875-125 MG PO TABS: 1.0000 | ORAL_TABLET | Freq: Two times a day (BID) | ORAL | 0 refills | Status: DC

## 2022-12-05 MED ORDER — PREDNISONE 20 MG PO TABS: 40.0000 mg | ORAL_TABLET | Freq: Every day | ORAL | 0 refills | Status: AC

## 2022-12-05 MED ORDER — IOHEXOL 350 MG/ML SOLN
100.0000 mL | Freq: Once | INTRAVENOUS | Status: AC | PRN
  Administered 2022-12-05: 75 mL via INTRAVENOUS

## 2022-12-05 NOTE — ED Triage Notes (Signed)
Pt states that she has had productive cough x 3 days. Pt was seen at [facility] and xray was negative and she is waiting for covid results. Pt comes in tonight because she had minimal blood-tinged sputum.

## 2022-12-05 NOTE — Progress Notes (Signed)
Plan of Care Note for accepted transfer   Patient: Hailey Ross MRN: [id number]   DOA: 12/05/2022  Facility requesting transfer: [facility]   Requesting Provider: Dr. Earlene Plater   Reason for transfer: Acute hypoxic respiratory failure   Facility course: 52 yr old lady with HTN, CKD 3A, pulmonary sarcoidosis, PE on Xarelto, pulmonary hypertension, and OSA who presents with cough, congestion, and blood-tinged sputum.  She is afebrile with normal heart rate, normal respiratory rate, and stable blood pressure but was saturating low to mid 80s on room air while at rest, and dropped to the 70s with ambulation in the ED.  ED workup notable for creatinine 1.28, WBC 18,500, negative respiratory virus panel, and CTA chest without evidence for PE or other acute disease.  She was started on supplemental oxygen.  Plan of care: The patient is accepted for admission to Telemetry unit, at [facility].   Author: Briscoe Deutscher, MD 12/05/2022  Check [email or web address] for on-call coverage.  Nursing staff, Please call [facility] Admits & Consults System-Wide number on Amion as soon as patient's arrival, so appropriate admitting provider can evaluate the pt.

## 2022-12-05 NOTE — ED Provider Notes (Signed)
[place] EMERGENCY DEPARTMENT AT [facility] Provider Note   CSN: [id number] Arrival date & time: 12/05/22  2022     History {Add pertinent medical, surgical, social history, OB history to HPI:1} Chief Complaint  Patient presents with   Cough    MARKITA URRABAZO is a 52 y.o. female.   Cough 52 year old female history of prior PE on Xarelto, CKD, sarcoidosis presenting for cough, shortness of breath, hemoptysis.  Patient states for 3 days she has had reactive cough, chills and mild headache.  Today she continued to feel bad so went to urgent care.  They did a chest x-ray which showed chronic findings and sent her home.  However today she developed some hemoptysis, coughing up a small amount of bright red blood.  No blood clots.  No melena or hematochezia or hematemesis.  She presents for evaluation.  Feels similar to prior episode of PE although she is been taking her Xarelto has not missed any doses.  No chest pain.  She is otherwise been at her baseline health.     Home Medications Prior to Admission medications   Medication Sig Start Date End Date Taking? Authorizing Provider  acetaminophen (TYLENOL) 500 MG tablet Take 500 mg by mouth every 4 (four) hours as needed for mild pain, moderate pain or headache.     [provider]  albuterol (VENTOLIN HFA) 108 (90 Base) MCG/ACT inhaler Inhale 2 puffs into the lungs every 6 (six) hours as needed for wheezing or shortness of breath. 08/11/21   Leath-Warren, Sadie Haber, NP  amoxicillin-clavulanate (AUGMENTIN) 875-125 MG tablet Take 1 tablet by mouth every 12 (twelve) hours for 5 days. 12/05/22 12/10/22  Leath-Warren, Sadie Haber, NP  Budeson-Glycopyrrol-Formoterol (BREZTRI AEROSPHERE) 160-9-4.8 MCG/ACT AERO Inhale 2 puffs into the lungs in the morning and at bedtime. 08/12/22   Coralyn Helling, MD  diltiazem (CARDIZEM) 30 MG tablet Take 1 tablet (30 mg total) by mouth daily as needed (For palpitations). May take an additional 30 mg  tablet daily as needed for palpitations 10/05/22   Sharlene Dory, NP  ezetimibe (ZETIA) 10 MG tablet Take 1 tablet (10 mg total) by mouth daily. 10/18/22   Gilmore Laroche, FNP  fluticasone (FLONASE) 50 MCG/ACT nasal spray Place 1 spray into both nostrils at bedtime. 08/04/21   Coralyn Helling, MD  folic acid (FOLVITE) 1 MG tablet Take 1 tablet (1 mg total) by mouth daily. 10/18/22   Parrett, Virgel Bouquet, NP  lisinopril (ZESTRIL) 5 MG tablet Take 1 tablet (5 mg total) by mouth daily. 12/17/21   Paseda, Baird Kay, FNP  LORazepam (ATIVAN) 0.5 MG tablet Take 0.5 mg by mouth daily as needed. 01/21/21   [provider]  methotrexate (RHEUMATREX) 2.5 MG tablet Take 2 tabs weekly for 2 weeks then increase to 3 tabs weekly for 2 weeks , then 4 tabs weekly . 10/18/22   Parrett, Virgel Bouquet, NP  metoCLOPramide (REGLAN) 5 MG tablet Take 1 tablet (5 mg total) by mouth 2 (two) times daily. 11/29/22   Zehr, Princella Pellegrini, PA-C  metoprolol tartrate (LOPRESSOR) 100 MG tablet Take 1 tablet 2 hours prior to CT Scan 11/17/22   Sharlene Dory, NP  olmesartan (BENICAR) 5 MG tablet Take 5 mg by mouth daily. 08/01/22   [provider]  pantoprazole (PROTONIX) 40 MG tablet TAKE 1 TABLET(40 MG) BY MOUTH DAILY 04/14/22   Parrett, Virgel Bouquet, NP  prazosin (MINIPRESS) 1 MG capsule 1 mg if needed 07/27/20   [provider]  predniSONE (  DELTASONE) 20 MG tablet Take 2 tablets (40 mg total) by mouth daily with breakfast for 5 days. 12/05/22 12/10/22  Leath-Warren, Sadie Haber, NP  promethazine-dextromethorphan (PROMETHAZINE-DM) 6.25-15 MG/5ML syrup Take 5 mLs by mouth 4 (four) times daily as needed. 12/05/22   Leath-Warren, Sadie Haber, NP  senna-docusate (SENOKOT-S) 8.6-50 MG tablet Take 1 tablet by mouth daily. 10/04/22   Gilmore Laroche, FNP  XARELTO 20 MG TABS tablet TAKE 1 TABLET(20 MG) BY MOUTH DAILY WITH SUPPER 07/06/22   Parrett, Tammy S, NP  zolpidem (AMBIEN) 10 MG tablet Take 10 mg by mouth at bedtime as needed. 03/18/21   [provider]      Allergies    Patient has no known allergies.    Review of Systems   Review of Systems  Respiratory:  Positive for cough.   Review of systems completed and notable as per HPI.  ROS otherwise negative.   Physical Exam Updated Vital Signs BP (!) 119/93   Pulse 86   Temp 98.7 F (37.1 C)   Resp 20   LMP 04/01/2021 (Exact Date)   SpO2 94%  Physical Exam Vitals and nursing note reviewed.  Constitutional:      General: She is not in acute distress.    Appearance: She is well-developed.  HENT:     Head: Normocephalic and atraumatic.     Mouth/Throat:     Mouth: Mucous membranes are moist.     Pharynx: Oropharynx is clear.  Eyes:     Extraocular Movements: Extraocular movements intact.     Conjunctiva/sclera: Conjunctivae normal.     Pupils: Pupils are equal, round, and reactive to light.  Cardiovascular:     Rate and Rhythm: Normal rate and regular rhythm.     Heart sounds: No murmur heard. Pulmonary:     Effort: Pulmonary effort is normal. No respiratory distress.     Breath sounds: Normal breath sounds.  Abdominal:     Palpations: Abdomen is soft.     Tenderness: There is no abdominal tenderness. There is no guarding or rebound.  Musculoskeletal:        General: No swelling.     Cervical back: Neck supple.     Right lower leg: No edema.     Left lower leg: No edema.  Skin:    General: Skin is warm and dry.     Capillary Refill: Capillary refill takes less than 2 seconds.  Neurological:     General: No focal deficit present.     Mental Status: She is alert and oriented to person, place, and time. Mental status is at baseline.  Psychiatric:        Mood and Affect: Mood normal.     ED Results / Procedures / Treatments   Labs (all labs ordered are listed, but only abnormal results are displayed) Labs Reviewed  RESP PANEL BY RT-PCR (RSV, FLU A&B, COVID)  [id number]    EKG None  Radiology DG Chest 2 View  Result Date: 12/05/2022 CLINICAL  DATA:  cough, SOB, hx of sarcoidosis EXAM: CHEST - 2 VIEW COMPARISON:  05/17/2022 FINDINGS: Largely stable coarse biapical and perihilar coarse pulmonary interstitial opacities. No convincing new airspace disease or overt edema. Heart size and mediastinal contours are within normal limits. No effusion. Visualized bones unremarkable. IMPRESSION: Chronic pulmonary interstitial opacities. No acute findings. Electronically Signed   By: Corlis Leak M.D.   On: 12/05/2022 10:32    Procedures Procedures  {Document cardiac monitor, telemetry assessment procedure when  appropriate:1}  Medications Ordered in ED Medications - No data to display  ED Course/ Medical Decision Making/ A&P   {   Click here for ABCD2, HEART and other calculatorsREFRESH Note before signing :1}                              Medical Decision Making  Medical Decision Making:   ELVER VANFLEET is a 52 y.o. female who presented to the ED today with hemoptysis, cough.  Vital signs reviewed.  On my examination she is hypoxic requiring 2 L nasal cannula.  She is clear lungs and normal work of breathing.  She does have viral infection, evaluate for COVID, flu.  However am concerned for possible PE given hemoptysis and history, also consider exacerbation of her sarcoidosis, pneumonia.  Will obtain CT scan and workup.   {crccomplexity:27900} Reviewed and confirmed nursing documentation for past medical history, family history, social history.  Initial Study Results:   Laboratory  All laboratory results reviewed.  Labs notable for ***  ***EKG EKG was reviewed independently. Rate, rhythm, axis, intervals all examined and without medically relevant abnormality. ST segments without concerns for elevations.    Radiology:  All images reviewed independently. ***Agree with radiology report at this time.      Consults: Case discussed with ***.   Reassessment and Plan:   ***    Patient's presentation is most consistent with {EM  COPA:27473}     {Document critical care time when appropriate:1} {Document review of labs and clinical decision tools ie heart score, Chads2Vasc2 etc:1}  {Document your independent review of radiology images, and any outside records:1} {Document your discussion with family members, caretakers, and with consultants:1} {Document social determinants of health affecting pt's care:1} {Document your decision making why or why not admission, treatments were needed:1} Final Clinical Impression(s) / ED Diagnoses Final diagnoses:  None    Rx / DC Orders ED Discharge Orders     None

## 2022-12-05 NOTE — ED Provider Notes (Addendum)
[facility]    CSN: [id number] Arrival date & time: 12/05/22  4098      History   Chief Complaint Chief Complaint  Patient presents with   Cough    Mucus headache lower back pain - Entered by patient    HPI Hailey Ross is a 52 y.o. female.   The history is provided by the patient.   The patient presents for complaints of a productive cough that started over the past 2 to 3 days.  Patient also complains of chest tightness, wheezing and intermittent shortness of breath.  Patient reports she does have a history of underlying sarcoidosis.  She reports this morning, she did experience 1 episode of hemoptysis, but wonders if this may have also come from the nosebleed that she experienced.  Patient denies fever, chills, sore throat, difficulty breathing, chest pain, abdominal pain, nausea, vomiting, or diarrhea.  She reports she has been taking Mucinex for her symptoms.  Past Medical History:  Diagnosis Date   Acute cholecystitis 08/24/2016   Acute kidney injury superimposed on chronic kidney disease (HCC) 06/20/2019   Acute respiratory failure with hypoxia (HCC) 06/19/2019   Annual visit for general adult medical examination with abnormal findings 04/16/2019   Arthritis    right knee   Bronchiectasis (HCC)    CKD (chronic kidney disease)    Iron deficiency    Lightheadedness 05/10/2019   PE (pulmonary thromboembolism) (HCC) 06/2019   Pre-syncope 04/16/2019   Sarcoidosis    Sarcoidosis    Sleep apnea    c pap   Smoker 02/12/2014    Patient Active Problem List   Diagnosis Date Noted   Gastroparesis 11/29/2022   Colitis 10/18/2022   Other constipation 10/04/2022   Upper abdominal pain 04/29/2022   Early satiety 04/29/2022   Acute bronchitis 03/21/2022   Immunization due 12/17/2021   Need for immunization against influenza 12/17/2021   Fatigue 12/17/2021   Encounter for annual general medical examination with abnormal findings in adult 08/30/2021   CAP  (community acquired pneumonia) 06/16/2021   Acute midline low back pain without sciatica 04/22/2021   Left lower quadrant abdominal pain 04/20/2021   Atypical chest pain 01/26/2021   Synovitis of left knee 01/19/2021   GERD (gastroesophageal reflux disease) 12/29/2020   Abdominal pain, epigastric 12/28/2020   Primary osteoarthritis of left knee 10/27/2020   Grief at loss of child 06/09/2020   Insomnia 06/09/2020   Right ankle swelling 02/05/2020   Encounter for examination following treatment at hospital 01/28/2020   Palpitations 01/28/2020   Menorrhagia with regular cycle 10/15/2019   OSA (obstructive sleep apnea) 10/08/2019   Right knee pain 08/29/2019   Positive ANA (antinuclear antibody) 08/29/2019   Essential hypertension 07/04/2019   Pain and swelling of right knee 07/04/2019   Hyperlipidemia 07/04/2019   Stage 2 chronic kidney disease 07/04/2019   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   Encounter for support and coordination of transition of care 07/04/2019   Daytime hypersomnolence 07/02/2019   Pulmonary hypertension (HCC) 07/02/2019   CKD (chronic kidney disease)    Bilateral pulmonary embolism (HCC) 06/18/2019   Lightheadedness 05/10/2019   Vitamin D deficiency 05/10/2019   Obesity (BMI 30-39.9) 04/16/2019   Sarcoidosis 05/04/2015   Bronchiectasis (HCC) 05/04/2015    Past Surgical History:  Procedure Laterality Date   CHOLECYSTECTOMY N/A 08/25/2016   Procedure: LAPAROSCOPIC CHOLECYSTECTOMY;  Surgeon: Abigail Miyamoto, MD;  Location: [facility] OR;  Service: General;  Laterality: N/A;   TUBAL LIGATION  02/1993  OB History     Gravida  3   Para  3   Term  3   Preterm      AB      Living  3      SAB      IAB      Ectopic      Multiple      Live Births               Home Medications    Prior to Admission medications   Medication Sig Start Date End Date Taking? Authorizing Provider  amoxicillin-clavulanate (AUGMENTIN) 875-125 MG tablet Take 1  tablet by mouth every 12 (twelve) hours for 5 days. 12/05/22 12/10/22 Yes Denario Bagot-Warren, Sadie Haber, NP  predniSONE (DELTASONE) 20 MG tablet Take 2 tablets (40 mg total) by mouth daily with breakfast for 5 days. 12/05/22 12/10/22 Yes Kerin Kren-Warren, Sadie Haber, NP  promethazine-dextromethorphan (PROMETHAZINE-DM) 6.25-15 MG/5ML syrup Take 5 mLs by mouth 4 (four) times daily as needed. 12/05/22  Yes Jaci Desanto-Warren, Sadie Haber, NP  acetaminophen (TYLENOL) 500 MG tablet Take 500 mg by mouth every 4 (four) hours as needed for mild pain, moderate pain or headache.     [provider]  albuterol (VENTOLIN HFA) 108 (90 Base) MCG/ACT inhaler Inhale 2 puffs into the lungs every 6 (six) hours as needed for wheezing or shortness of breath. 08/11/21   Fortunata Betty-Warren, Sadie Haber, NP  Budeson-Glycopyrrol-Formoterol (BREZTRI AEROSPHERE) 160-9-4.8 MCG/ACT AERO Inhale 2 puffs into the lungs in the morning and at bedtime. 08/12/22   Coralyn Helling, MD  diltiazem (CARDIZEM) 30 MG tablet Take 1 tablet (30 mg total) by mouth daily as needed (For palpitations). May take an additional 30 mg tablet daily as needed for palpitations 10/05/22   Sharlene Dory, NP  ezetimibe (ZETIA) 10 MG tablet Take 1 tablet (10 mg total) by mouth daily. 10/18/22   Gilmore Laroche, FNP  fluticasone (FLONASE) 50 MCG/ACT nasal spray Place 1 spray into both nostrils at bedtime. 08/04/21   Coralyn Helling, MD  folic acid (FOLVITE) 1 MG tablet Take 1 tablet (1 mg total) by mouth daily. 10/18/22   Parrett, Virgel Bouquet, NP  lisinopril (ZESTRIL) 5 MG tablet Take 1 tablet (5 mg total) by mouth daily. 12/17/21   Paseda, Baird Kay, FNP  LORazepam (ATIVAN) 0.5 MG tablet Take 0.5 mg by mouth daily as needed. 01/21/21   [provider]  methotrexate (RHEUMATREX) 2.5 MG tablet Take 2 tabs weekly for 2 weeks then increase to 3 tabs weekly for 2 weeks , then 4 tabs weekly . 10/18/22   Parrett, Virgel Bouquet, NP  metoCLOPramide (REGLAN) 5 MG tablet Take 1 tablet (5 mg total) by mouth  2 (two) times daily. 11/29/22   Zehr, Princella Pellegrini, PA-C  metoprolol tartrate (LOPRESSOR) 100 MG tablet Take 1 tablet 2 hours prior to CT Scan 11/17/22   Sharlene Dory, NP  olmesartan (BENICAR) 5 MG tablet Take 5 mg by mouth daily. 08/01/22   [provider]  pantoprazole (PROTONIX) 40 MG tablet TAKE 1 TABLET(40 MG) BY MOUTH DAILY 04/14/22   Parrett, Virgel Bouquet, NP  prazosin (MINIPRESS) 1 MG capsule 1 mg if needed 07/27/20   [provider]  senna-docusate (SENOKOT-S) 8.6-50 MG tablet Take 1 tablet by mouth daily. 10/04/22   Gilmore Laroche, FNP  XARELTO 20 MG TABS tablet TAKE 1 TABLET(20 MG) BY MOUTH DAILY WITH SUPPER 07/06/22   Parrett, Tammy S, NP  zolpidem (AMBIEN) 10 MG tablet Take 10 mg by mouth  at bedtime as needed. 03/18/21   [provider]    Family History Family History  Problem Relation Age of Onset   Hypertension Mother    Cancer Mother    Colon polyps Mother    Healthy Sister    Healthy Sister    Healthy Sister    Healthy Sister    Healthy Son    Healthy Son    Healthy Daughter    Colon cancer Neg Hx    Esophageal cancer Neg Hx    Rectal cancer Neg Hx    Stomach cancer Neg Hx     Social History Social History   Tobacco Use   Smoking status: Former    Current packs/day: 0.00    Average packs/day: 1 pack/day for 20.0 years (20.0 ttl pk-yrs)    Types: Cigarettes    Start date: 03/21/1995    Quit date: 03/21/2015    Years since quitting: 7.7    Passive exposure: Past   Smokeless tobacco: Never  Vaping Use   Vaping status: Never Used  Substance Use Topics   Alcohol use: Not Currently    Comment: Rare   Drug use: Never     Allergies   Patient has no known allergies.   Review of Systems Review of Systems Per HPI  Physical Exam Triage Vital Signs ED Triage Vitals  Encounter Vitals Group     BP 12/05/22 0928 119/82     Systolic BP Percentile --      Diastolic BP Percentile --      Pulse Rate 12/05/22 0928 98     Resp 12/05/22 0928  16     Temp 12/05/22 0928 99.7 F (37.6 C)     Temp Source 12/05/22 0928 Oral     SpO2 12/05/22 0928 96 %     Weight --      Height --      Head Circumference --      Peak Flow --      Pain Score 12/05/22 0933 8     Pain Loc --      Pain Education --      Exclude from Growth Chart --    No data found.  Updated Vital Signs BP 119/82 (BP Location: Right Arm)   Pulse 98   Temp 99.7 F (37.6 C) (Oral)   Resp 16   LMP 04/01/2021 (Exact Date)   SpO2 96%   Visual Acuity Right Eye Distance:   Left Eye Distance:   Bilateral Distance:    Right Eye Near:   Left Eye Near:    Bilateral Near:     Physical Exam Vitals and nursing note reviewed.  Constitutional:      General: She is not in acute distress.    Appearance: Normal appearance.  HENT:     Head: Normocephalic.     Right Ear: Tympanic membrane, ear canal and external ear normal.     Left Ear: Tympanic membrane, ear canal and external ear normal.     Nose: Congestion present.     Mouth/Throat:     Lips: Pink.     Mouth: Mucous membranes are moist.     Pharynx: Oropharynx is clear. Uvula midline. Posterior oropharyngeal erythema and postnasal drip present. No pharyngeal swelling, oropharyngeal exudate or uvula swelling.  Eyes:     Extraocular Movements: Extraocular movements intact.     Conjunctiva/sclera: Conjunctivae normal.     Pupils: Pupils are equal, round, and reactive to light.  Cardiovascular:  Rate and Rhythm: Normal rate and regular rhythm.     Pulses: Normal pulses.     Heart sounds: Normal heart sounds.  Pulmonary:     Effort: Pulmonary effort is normal. No respiratory distress.     Breath sounds: No stridor. No wheezing, rhonchi or rales.     Comments: Decreased air movement in the bilateral lower lung fields Abdominal:     General: Bowel sounds are normal.     Palpations: Abdomen is soft.     Tenderness: There is no abdominal tenderness.  Musculoskeletal:     Cervical back: Normal range of  motion.  Lymphadenopathy:     Cervical: No cervical adenopathy.  Skin:    General: Skin is warm and dry.  Neurological:     General: No focal deficit present.     Mental Status: She is alert and oriented to person, place, and time.  Psychiatric:        Mood and Affect: Mood normal.        Behavior: Behavior normal.      UC Treatments / Results  Labs (all labs ordered are listed, but only abnormal results are displayed) Labs Reviewed  SARS CORONAVIRUS 2 (TAT 6-24 HRS)    EKG   Radiology DG Chest 2 View  Result Date: 12/05/2022 CLINICAL DATA:  cough, SOB, hx of sarcoidosis EXAM: CHEST - 2 VIEW COMPARISON:  05/17/2022 FINDINGS: Largely stable coarse biapical and perihilar coarse pulmonary interstitial opacities. No convincing new airspace disease or overt edema. Heart size and mediastinal contours are within normal limits. No effusion. Visualized bones unremarkable. IMPRESSION: Chronic pulmonary interstitial opacities. No acute findings. Electronically Signed   By: Corlis Leak M.D.   On: 12/05/2022 10:32    Procedures Procedures (including critical care time)  Medications Ordered in UC Medications - No data to display  Initial Impression / Assessment and Plan / UC Course  I have reviewed the triage vital signs and the nursing notes.  Pertinent labs & imaging results that were available during my care of the patient were reviewed by me and considered in my medical decision making (see chart for details).  The patient is well-appearing, she is in no acute distress, vital signs are stable.  COVID test is pending, she is a candidate to receive molnupiravir if the COVID test is positive.  X-ray does not show any acute cardiopulmonary disease.  She does have chronic pulmonary interstitial opacities, consistent with her underlying history of sarcoidosis.  Will treat empirically for possible infection with Augmentin 875/125 mg tablets, and Promethazine DM for her cough.  Supportive  care recommendations were provided and discussed with the patient to include increasing fluids, allowing for plenty of rest, over-the-counter analgesics, considering use of her CPAP for use of a humidifier, and sleeping elevated.  Patient was advised to follow-up with her pulmonologist if symptoms fail to improve with his treatment.  Patient is in agreement with this plan of care and verbalizes understanding.  All questions were answered.  Patient stable for discharge.   Final Clinical Impressions(s) / UC Diagnoses   Final diagnoses:  Lower respiratory infection  History of sarcoidosis  Encounter for screening for COVID-19     Discharge Instructions      The chest x-ray does not show pneumonia.  It does show findings consistent with your underlying history of sarcoidosis. Take medication as prescribed. Increase fluids and allow for plenty of rest. Try to resume use of your CPAP or use a humidifier in your bedroom  at nighttime to help with cough.  Also recommend trying to sleep elevated on pillows while symptoms persist. May take over-the-counter Tylenol as needed for pain or discomfort. If symptoms are not improving with this treatment, I would like for you to follow-up with your pulmonologist for further evaluation. Follow-up as needed.     ED Prescriptions     Medication Sig Dispense Auth. Provider   predniSONE (DELTASONE) 20 MG tablet Take 2 tablets (40 mg total) by mouth daily with breakfast for 5 days. 10 tablet Finesse Fielder-Warren, Sadie Haber, NP   amoxicillin-clavulanate (AUGMENTIN) 875-125 MG tablet Take 1 tablet by mouth every 12 (twelve) hours for 5 days. 10 tablet Josi Roediger-Warren, Sadie Haber, NP   promethazine-dextromethorphan (PROMETHAZINE-DM) 6.25-15 MG/5ML syrup Take 5 mLs by mouth 4 (four) times daily as needed. 118 mL Jaquail Mclees-Warren, Sadie Haber, NP      PDMP not reviewed this encounter.   Abran Cantor, NP 12/05/22 1038    Velora Horstman-Warren, Sadie Haber, NP 12/05/22  1048

## 2022-12-05 NOTE — ED Notes (Signed)
Patient transported to CT 

## 2022-12-05 NOTE — ED Triage Notes (Signed)
Pt c/o cough with a small amount of blood in the phlegm, headache, nose bleed that occurred last night, nasal congestion, chest congestion with tightness and heaviness headache body ache x 1 week started with the cough and progressively worsened.

## 2022-12-05 NOTE — Discharge Instructions (Addendum)
The chest x-ray does not show pneumonia.  It does show findings consistent with your underlying history of sarcoidosis. Take medication as prescribed. Increase fluids and allow for plenty of rest. Try to resume use of your CPAP or use a humidifier in your bedroom at nighttime to help with cough.  Also recommend trying to sleep elevated on pillows while symptoms persist. May take over-the-counter Tylenol as needed for pain or discomfort. If symptoms are not improving with this treatment, I would like for you to follow-up with your pulmonologist for further evaluation. Follow-up as needed.

## 2022-12-05 NOTE — ED Notes (Signed)
Patient SpO2 78-82% after coughing spell. EDP at bedside. Patient states baseline SpO2 92-94%. Nasal Cannula placed on patient at 2L

## 2022-12-06 ENCOUNTER — Encounter: Payer: Self-pay | Admitting: Family Medicine

## 2022-12-06 ENCOUNTER — Encounter (HOSPITAL_COMMUNITY): Payer: Self-pay | Admitting: Internal Medicine

## 2022-12-06 DIAGNOSIS — J9601 Acute respiratory failure with hypoxia: Secondary | ICD-10-CM

## 2022-12-06 DIAGNOSIS — N1831 Chronic kidney disease, stage 3a: Secondary | ICD-10-CM | POA: Diagnosis not present

## 2022-12-06 DIAGNOSIS — Z79899 Other long term (current) drug therapy: Secondary | ICD-10-CM | POA: Diagnosis not present

## 2022-12-06 DIAGNOSIS — Z7901 Long term (current) use of anticoagulants: Secondary | ICD-10-CM | POA: Insufficient documentation

## 2022-12-06 DIAGNOSIS — J0110 Acute frontal sinusitis, unspecified: Secondary | ICD-10-CM | POA: Diagnosis present

## 2022-12-06 DIAGNOSIS — I129 Hypertensive chronic kidney disease with stage 1 through stage 4 chronic kidney disease, or unspecified chronic kidney disease: Secondary | ICD-10-CM | POA: Diagnosis not present

## 2022-12-06 DIAGNOSIS — R042 Hemoptysis: Secondary | ICD-10-CM | POA: Diagnosis not present

## 2022-12-06 DIAGNOSIS — Z1152 Encounter for screening for COVID-19: Secondary | ICD-10-CM | POA: Diagnosis not present

## 2022-12-06 DIAGNOSIS — R040 Epistaxis: Secondary | ICD-10-CM | POA: Diagnosis present

## 2022-12-06 DIAGNOSIS — R059 Cough, unspecified: Secondary | ICD-10-CM | POA: Diagnosis not present

## 2022-12-06 DIAGNOSIS — Z86711 Personal history of pulmonary embolism: Secondary | ICD-10-CM | POA: Diagnosis not present

## 2022-12-06 DIAGNOSIS — Z87891 Personal history of nicotine dependence: Secondary | ICD-10-CM | POA: Diagnosis not present

## 2022-12-06 LAB — RESPIRATORY PANEL BY PCR

## 2022-12-06 LAB — BRAIN NATRIURETIC PEPTIDE: B Natriuretic Peptide: 26 pg/mL (ref 0.0–100.0)

## 2022-12-06 LAB — SARS CORONAVIRUS 2 (TAT 6-24 HRS): SARS Coronavirus 2: NEGATIVE

## 2022-12-06 MED ORDER — ONDANSETRON HCL 4 MG/2ML IJ SOLN: 4.0000 mg | Freq: Four times a day (QID) | INTRAMUSCULAR | Status: DC | PRN

## 2022-12-06 MED ORDER — AMOXICILLIN-POT CLAVULANATE 875-125 MG PO TABS: 1.0000 | ORAL_TABLET | Freq: Two times a day (BID) | ORAL | Status: DC

## 2022-12-06 MED ORDER — IPRATROPIUM-ALBUTEROL 0.5-2.5 (3) MG/3ML IN SOLN: 3.0000 mL | RESPIRATORY_TRACT | Status: DC | PRN

## 2022-12-06 MED ORDER — ONDANSETRON HCL 4 MG PO TABS: 4.0000 mg | ORAL_TABLET | Freq: Four times a day (QID) | ORAL | Status: DC | PRN

## 2022-12-06 MED ORDER — ACETAMINOPHEN 650 MG RE SUPP: 650.0000 mg | Freq: Four times a day (QID) | RECTAL | Status: DC | PRN

## 2022-12-06 MED ORDER — PREDNISONE 20 MG PO TABS: 40.0000 mg | ORAL_TABLET | Freq: Every day | ORAL | Status: DC

## 2022-12-06 MED ORDER — ACETAMINOPHEN 325 MG PO TABS: 650.0000 mg | ORAL_TABLET | Freq: Four times a day (QID) | ORAL | Status: DC | PRN

## 2022-12-06 MED ORDER — AMOXICILLIN-POT CLAVULANATE 875-125 MG PO TABS: 1.0000 | ORAL_TABLET | Freq: Two times a day (BID) | ORAL | 0 refills | Status: DC

## 2022-12-06 NOTE — Plan of Care (Signed)
  Problem: Education: Goal: Knowledge of General Education information will improve Description: Including pain rating scale, medication(s)/side effects and non-pharmacologic comfort measures Outcome: Completed/Met   Problem: Clinical Measurements: Goal: Ability to maintain clinical measurements within normal limits will improve Outcome: Completed/Met Goal: Will remain free from infection Outcome: Completed/Met Goal: Diagnostic test results will improve Outcome: Completed/Met Goal: Respiratory complications will improve Outcome: Progressing Goal: Cardiovascular complication will be avoided Outcome: Progressing   Problem: Activity: Goal: Risk for activity intolerance will decrease Outcome: Completed/Met

## 2022-12-06 NOTE — Consult Note (Signed)
NAME:  Hailey Ross, MRN:  [id number], DOB:  26-Jul-1970, LOS: 0 ADMISSION DATE:  12/05/2022, CONSULTATION DATE:  12/06/22 REFERRING MD:  Robb Matar - TRH , CHIEF COMPLAINT:  cough   History of Present Illness:  52 yo F PMH sarcoidosis of lung recently changed from pred to methotrexate, OSA, Bronchiectasis, PE on xarelto, CKD3a, gastroparesis  who presented to ED 8/26 with cough, associated SOB, wheezing hemoptysis, chills, HA x 3d. Also has had some nosebleeds-- last dose of Xarelto was Sunday 8/25. She initially presented to UC at [facility] 8/26 and was Rx 5d augmentin, 5d pred 40, promethazine after CXR was assuring against acute findings. Later presented to [facility] ED after small volume hemoptysis, and reportedly feeling similar to when she had prior PE. It sounds like at DB she had a low sat reading as well.  CTA chest neg for PE. Shows BUL honeycombing, fibrotic changes.    Admitted to [facility] 8/27.  PCCM is consulted in this setting.   In chart review looks like in June the patient was tried on prednisone for her sarcoid and pt began process of transitioning from chronic pred to methotrexate (goal 10mg  methotrexate)    Pertinent  Medical History  Sarcoidosis of lung, with fibrotic changes  OSA Bronchiectasis   Significant Hospital Events: Including procedures, antibiotic start and stop dates in addition to other pertinent events   8/26 UC then [facility] for cough.  8/27 admit OBV [facility]. PCCM consult   Interim History / Subjective:  Admitted to [facility], consulted   Ambulating the halls   Objective   Blood pressure (!) 129/90, pulse 83, temperature 98.3 F (36.8 C), temperature source Oral, resp. rate 18, height 5\' 5"  (1.651 m), weight 95.8 kg, last menstrual period 04/01/2021, SpO2 98%.        Intake/Output Summary (Last 24 hours) at 12/06/2022 1027 Last data filed at 12/06/2022 2536 Gross per 24 hour  Intake 240 ml  Output --  Net 240 ml   Filed Weights   12/06/22 0520  Weight:  95.8 kg    Examination: General: wdwn middle aged F NAD  HENT: NCAt  Lungs: Symmetrical chest expansion even unlabored on RA  Cardiovascular: cap refill brisk  Abdomen: round  Extremities: no acute joint deformity  Neuro: AAOX4 GU: no foley   Resolved Hospital Problem list     Assessment & Plan:   Sinusitis -- allergic vs infectious, favor infectious   Sarcoidosis of Lung, fibro-cavitary -- recently started on immunosuppression (Prednisone 09/2022, changed to methotrexate 10/2022)  Bronchiectasis  Nosebleed, mild and self limited  OSA on CAPAP 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 CTA doesn't look like there is dz progression of her sarcoid.  -think the low SpO2 at DB was likely spurious -- has fake nails.  -hemoptysis was a few specks of blood. Could be either from cough/capillary rupture vs related to nosebleed  P -think she can go home -- rec 10d of augmentin + 5d pred 40  -ok to resume xarelto  -send RVP -send sputum cx  -has follow up with pulm outpt in a few days   Best Practice (right click and "Reselect all SmartList Selections" daily)   Per primary   Labs   CBC: Recent Labs  Lab 12/05/22 2159  WBC 18.5*  NEUTROABS 15.5*  HGB 12.1  HCT 36.8  MCV 84.0  PLT 338    Basic Metabolic Panel: Recent Labs  Lab 12/05/22 2159  NA 134*  K 4.1  CL 100  CO2 22  GLUCOSE 149*  BUN 17  CREATININE 1.28*  CALCIUM 10.0   GFR: Estimated Creatinine Clearance: 58.8 mL/min (A) (by C-G formula based on SCr of 1.28 mg/dL (H)). Recent Labs  Lab 12/05/22 2159  WBC 18.5*    Liver Function Tests: Recent Labs  Lab 12/05/22 2159  AST 32  ALT 27  ALKPHOS 89  BILITOT 0.7  PROT 7.5  ALBUMIN 4.0   No results for input(s): "LIPASE", "AMYLASE" in the last 168 hours. No results for input(s): "AMMONIA" in the last 168 hours.  ABG    Component Value Date/Time   TCO2 26 08/24/2016 1422     Coagulation  Profile: No results for input(s): "INR", "PROTIME" in the last 168 hours.  Cardiac Enzymes: No results for input(s): "CKTOTAL", "CKMB", "CKMBINDEX", "TROPONINI" in the last 168 hours.  HbA1C: Hgb A1c MFr Bld  Date/Time Value Ref Range Status  09/02/2022 08:55 AM 6.2 (H) 4.8 - 5.6 % Final    Comment:             Prediabetes: 5.7 - 6.4          Diabetes: >6.4          Glycemic control for adults with diabetes: <7.0   04/22/2019 08:39 AM 5.7 (H) <5.7 % of total Hgb Final    Comment:    For someone without known diabetes, a hemoglobin  A1c value between 5.7% and 6.4% is consistent with prediabetes and should be confirmed with a  follow-up test. . For someone with known diabetes, a value <7% indicates that their diabetes is well controlled. A1c targets should be individualized based on duration of diabetes, age, comorbid conditions, and other considerations. . This assay result is consistent with an increased risk of diabetes. . Currently, no consensus exists regarding use of hemoglobin A1c for diagnosis of diabetes for children. .     CBG: No results for input(s): "GLUCAP" in the last 168 hours.  Review of Systems:   Review of Systems  Constitutional:  Positive for chills.  HENT:  Positive for sinus pain.   Respiratory:  Positive for cough, hemoptysis, sputum production and shortness of breath.   Cardiovascular: Negative.   Skin: Negative.      Past Medical History:  She,  has a past medical history of Acute cholecystitis (08/24/2016), Acute kidney injury superimposed on chronic kidney disease (HCC) (06/20/2019), Acute respiratory failure with hypoxia (HCC) (06/19/2019), Annual visit for general adult medical examination with abnormal findings (04/16/2019), Arthritis, Bronchiectasis (HCC), CKD (chronic kidney disease), Iron deficiency, Lightheadedness (05/10/2019), PE (pulmonary thromboembolism) (HCC) (06/2019), Pre-syncope (04/16/2019), Sarcoidosis, Sarcoidosis, Sleep apnea,  and Smoker (02/12/2014).   Surgical History:   Past Surgical History:  Procedure Laterality Date   CHOLECYSTECTOMY N/A 08/25/2016   Procedure: LAPAROSCOPIC CHOLECYSTECTOMY;  Surgeon: Abigail Miyamoto, MD;  Location: [facility] OR;  Service: General;  Laterality: N/A;   TUBAL LIGATION  02/1993     Social History:   reports that she quit smoking about 7 years ago. Her smoking use included cigarettes. She started smoking about 27 years ago. She has a 20 pack-year smoking history. She has been exposed to tobacco smoke. She has never used smokeless tobacco. She reports that she does not currently use alcohol. She reports that she does not use drugs.   Family History:  Her family history includes Cancer in her mother; Colon polyps in her mother; Healthy in her daughter, sister, sister, sister, sister, son,  and son; Hypertension in her mother. There is no history of Colon cancer, Esophageal cancer, Rectal cancer, or Stomach cancer.   Allergies No Known Allergies   Home Medications  Prior to Admission medications   Medication Sig Start Date End Date Taking? Authorizing Provider  acetaminophen (TYLENOL) 500 MG tablet Take 500 mg by mouth every 4 (four) hours as needed for mild pain, moderate pain or headache.    Yes [provider]  albuterol (VENTOLIN HFA) 108 (90 Base) MCG/ACT inhaler Inhale 2 puffs into the lungs every 6 (six) hours as needed for wheezing or shortness of breath. 08/11/21  Yes Leath-Warren, Sadie Haber, NP  amoxicillin-clavulanate (AUGMENTIN) 875-125 MG tablet Take 1 tablet by mouth every 12 (twelve) hours for 5 days. 12/05/22 12/10/22 Yes Leath-Warren, Sadie Haber, NP  Budeson-Glycopyrrol-Formoterol (BREZTRI AEROSPHERE) 160-9-4.8 MCG/ACT AERO Inhale 2 puffs into the lungs in the morning and at bedtime. 08/12/22  Yes Coralyn Helling, MD  diltiazem (CARDIZEM) 30 MG tablet Take 1 tablet (30 mg total) by mouth daily as needed (For palpitations). May take an additional 30 mg tablet daily as  needed for palpitations 10/05/22  Yes Sharlene Dory, NP  LORazepam (ATIVAN) 0.5 MG tablet Take 0.5 mg by mouth daily as needed for anxiety. 01/21/21  Yes [provider]  metoCLOPramide (REGLAN) 5 MG tablet Take 1 tablet (5 mg total) by mouth 2 (two) times daily. 11/29/22  Yes Zehr, Princella Pellegrini, PA-C  olmesartan (BENICAR) 5 MG tablet Take 5 mg by mouth daily. 08/01/22  Yes [provider]  pantoprazole (PROTONIX) 40 MG tablet TAKE 1 TABLET(40 MG) BY MOUTH DAILY 04/14/22  Yes Parrett, Tammy S, NP  prazosin (MINIPRESS) 1 MG capsule Take 1 mg by mouth at bedtime as needed (Sleep). 07/27/20  Yes [provider]  predniSONE (DELTASONE) 20 MG tablet Take 2 tablets (40 mg total) by mouth daily with breakfast for 5 days. 12/05/22 12/10/22 Yes Leath-Warren, Sadie Haber, NP  promethazine-dextromethorphan (PROMETHAZINE-DM) 6.25-15 MG/5ML syrup Take 5 mLs by mouth 4 (four) times daily as needed. 12/05/22  Yes Leath-Warren, Sadie Haber, NP  rosuvastatin (CRESTOR) 10 MG tablet Take 10 mg by mouth at bedtime. 12/03/22  Yes [provider]  XARELTO 20 MG TABS tablet TAKE 1 TABLET(20 MG) BY MOUTH DAILY WITH SUPPER 07/06/22  Yes Parrett, Tammy S, NP  zolpidem (AMBIEN) 10 MG tablet Take 10 mg by mouth at bedtime as needed for sleep. 03/18/21  Yes [provider]  metoprolol tartrate (LOPRESSOR) 100 MG tablet Take 1 tablet 2 hours prior to CT Scan Patient not taking: Reported on 12/06/2022 11/17/22   Sharlene Dory, NP     Critical care time: na      Tessie Fass MSN, AGACNP-BC [facility] Pulmonary/Critical Care Medicine Amion for pager  12/06/2022, 9:58 AM

## 2022-12-06 NOTE — H&P (Signed)
History and Physical    Patient: Hailey Ross:[id number] DOB: Oct 20, 1970 DOA: 12/05/2022 DOS: the patient was seen and examined on 12/06/2022 PCP: Gilmore Laroche, FNP  Patient coming from: Home  Chief Complaint:  Chief Complaint  Patient presents with   Cough   HPI: Hailey Ross is a 52 y.o. female with medical history significant of acute cholecystitis, cholecystectomy, history of AKI superimposed on stage IIIa CKD, iron deficiency, lightheadedness, bilateral pulmonary embolism, presyncope, history of cigarette smoking, sleep apnea, sarcoidosis, history of acute respiratory failure with hypoxia who presented to the emergency department with complaints of frontal headache, productive cough which is occasionally blood t tinged, and episode of epistaxis on Sunday evening, wheezing, chest congestion with tightness and heaviness for the past week.  She was seen at the urgent care yesterday and was given prescriptions for Augmentin and prednisone. He denied fever, chills or night sweats.  No palpitations, diaphoresis, PND, orthopnea or pitting edema of the lower extremities.  Occasional dyspepsia, following with GI and given metoclopramide recently.  No recent abdominal pain, nausea, emesis, diarrhea, constipation, melena or hematochezia.  No flank pain, dysuria, frequency or hematuria.  No polyuria, polydipsia, polyphagia or blurred vision.   Lab work: Coronavirus, influenza and RSV PCR was negative.  CBC showed a white count of 18.5, hemoglobin 12.1 g/dL platelets 956.  BNP was normal.  CMP showed a glucose of 149 and creatinine 1.29 mg/deciliter.  The rest of the CMP measurements were normal after sodium was corrected.  Imaging: Two-view chest radiograph with chronic pulmonary interstitial changes.  No acute findings.  CTA chest showing chronic changes in the lungs consistent with clinical history of sarcoidosis.  No evidence to suggest PE.  Prominence of the ascending aorta at 3.9 cm.   Scattered hilar and mediastinal nodes likely related to the patient's known history of sarcoidosis.   ED course: Initial vital signs were temperature 98.7 F, pulse 90, respiration 20, BP 113/89 mmHg O2 sat 99% on room air.  The patient subsequently became hypoxic to 83% and was placed on nasal cannula oxygen at 2 LPM.  Review of Systems: As mentioned in the history of present illness. All other systems reviewed and are negative. Past Medical History:  Diagnosis Date   Acute cholecystitis 08/24/2016   Acute kidney injury superimposed on chronic kidney disease (HCC) 06/20/2019   Acute respiratory failure with hypoxia (HCC) 06/19/2019   Annual visit for general adult medical examination with abnormal findings 04/16/2019   Arthritis    right knee   Bronchiectasis (HCC)    CKD (chronic kidney disease)    Iron deficiency    Lightheadedness 05/10/2019   PE (pulmonary thromboembolism) (HCC) 06/2019   Pre-syncope 04/16/2019   Sarcoidosis    Sarcoidosis    Sleep apnea    c pap   Smoker 02/12/2014   Past Surgical History:  Procedure Laterality Date   CHOLECYSTECTOMY N/A 08/25/2016   Procedure: LAPAROSCOPIC CHOLECYSTECTOMY;  Surgeon: Abigail Miyamoto, MD;  Location: [facility] OR;  Service: General;  Laterality: N/A;   TUBAL LIGATION  02/1993   Social History:  reports that she quit smoking about 7 years ago. Her smoking use included cigarettes. She started smoking about 27 years ago. She has a 20 pack-year smoking history. She has been exposed to tobacco smoke. She has never used smokeless tobacco. She reports that she does not currently use alcohol. She reports that she does not use drugs.  No Known Allergies  Family History  Problem Relation Age of  Onset   Hypertension Mother    Cancer Mother    Colon polyps Mother    Healthy Sister    Healthy Sister    Healthy Sister    Healthy Sister    Healthy Son    Healthy Son    Healthy Daughter    Colon cancer Neg Hx    Esophageal cancer Neg Hx     Rectal cancer Neg Hx    Stomach cancer Neg Hx     Prior to Admission medications   Medication Sig Start Date End Date Taking? Authorizing Provider  acetaminophen (TYLENOL) 500 MG tablet Take 500 mg by mouth every 4 (four) hours as needed for mild pain, moderate pain or headache.     [provider]  albuterol (VENTOLIN HFA) 108 (90 Base) MCG/ACT inhaler Inhale 2 puffs into the lungs every 6 (six) hours as needed for wheezing or shortness of breath. 08/11/21   Leath-Warren, Sadie Haber, NP  amoxicillin-clavulanate (AUGMENTIN) 875-125 MG tablet Take 1 tablet by mouth every 12 (twelve) hours for 5 days. 12/05/22 12/10/22  Leath-Warren, Sadie Haber, NP  Budeson-Glycopyrrol-Formoterol (BREZTRI AEROSPHERE) 160-9-4.8 MCG/ACT AERO Inhale 2 puffs into the lungs in the morning and at bedtime. 08/12/22   Coralyn Helling, MD  diltiazem (CARDIZEM) 30 MG tablet Take 1 tablet (30 mg total) by mouth daily as needed (For palpitations). May take an additional 30 mg tablet daily as needed for palpitations 10/05/22   Sharlene Dory, NP  ezetimibe (ZETIA) 10 MG tablet Take 1 tablet (10 mg total) by mouth daily. 10/18/22   Gilmore Laroche, FNP  fluticasone (FLONASE) 50 MCG/ACT nasal spray Place 1 spray into both nostrils at bedtime. 08/04/21   Coralyn Helling, MD  folic acid (FOLVITE) 1 MG tablet Take 1 tablet (1 mg total) by mouth daily. 10/18/22   Parrett, Virgel Bouquet, NP  lisinopril (ZESTRIL) 5 MG tablet Take 1 tablet (5 mg total) by mouth daily. 12/17/21   Paseda, Baird Kay, FNP  LORazepam (ATIVAN) 0.5 MG tablet Take 0.5 mg by mouth daily as needed. 01/21/21   [provider]  methotrexate (RHEUMATREX) 2.5 MG tablet Take 2 tabs weekly for 2 weeks then increase to 3 tabs weekly for 2 weeks , then 4 tabs weekly . 10/18/22   Parrett, Virgel Bouquet, NP  metoCLOPramide (REGLAN) 5 MG tablet Take 1 tablet (5 mg total) by mouth 2 (two) times daily. 11/29/22   Zehr, Princella Pellegrini, PA-C  metoprolol tartrate (LOPRESSOR) 100 MG tablet Take 1  tablet 2 hours prior to CT Scan 11/17/22   Sharlene Dory, NP  olmesartan (BENICAR) 5 MG tablet Take 5 mg by mouth daily. 08/01/22   [provider]  pantoprazole (PROTONIX) 40 MG tablet TAKE 1 TABLET(40 MG) BY MOUTH DAILY 04/14/22   Parrett, Virgel Bouquet, NP  prazosin (MINIPRESS) 1 MG capsule 1 mg if needed 07/27/20   [provider]  predniSONE (DELTASONE) 20 MG tablet Take 2 tablets (40 mg total) by mouth daily with breakfast for 5 days. 12/05/22 12/10/22  Leath-Warren, Sadie Haber, NP  promethazine-dextromethorphan (PROMETHAZINE-DM) 6.25-15 MG/5ML syrup Take 5 mLs by mouth 4 (four) times daily as needed. 12/05/22   Leath-Warren, Sadie Haber, NP  senna-docusate (SENOKOT-S) 8.6-50 MG tablet Take 1 tablet by mouth daily. 10/04/22   Gilmore Laroche, FNP  XARELTO 20 MG TABS tablet TAKE 1 TABLET(20 MG) BY MOUTH DAILY WITH SUPPER 07/06/22   Parrett, Tammy S, NP  zolpidem (AMBIEN) 10 MG tablet Take 10 mg by mouth at bedtime as  needed. 03/18/21   [provider]    Physical Exam: Vitals:   12/05/22 2140 12/06/22 0104 12/06/22 0508 12/06/22 0520  BP:  125/78 116/78   Pulse: 86 81 76   Resp:  (!) 26 18   Temp:  98.5 F (36.9 C) 98.5 F (36.9 C)   TempSrc:  Oral Oral   SpO2: 94% 97% 100%   Weight:    95.8 kg  Height:    5\' 5"  (1.651 m)   Physical Exam Vitals and nursing note reviewed.  Constitutional:      General: She is awake. She is not in acute distress.    Appearance: Normal appearance. She is obese.  HENT:     Head: Normocephalic.     Nose: No rhinorrhea.     Mouth/Throat:     Mouth: Mucous membranes are moist.  Eyes:     General: No scleral icterus.    Pupils: Pupils are equal, round, and reactive to light.  Cardiovascular:     Rate and Rhythm: Normal rate and regular rhythm.  Pulmonary:     Effort: Pulmonary effort is normal.  Abdominal:     General: Bowel sounds are normal. There is no distension.     Palpations: Abdomen is soft.  Musculoskeletal:     Cervical  back: Neck supple.     Right lower leg: No edema.     Left lower leg: No edema.  Skin:    General: Skin is warm and dry.  Neurological:     General: No focal deficit present.     Mental Status: She is alert and oriented to person, place, and time.  Psychiatric:        Mood and Affect: Mood normal.        Behavior: Behavior normal. Behavior is cooperative.     Data Reviewed:  Results are pending, will review when available.  11/30/2022 transthoracic echocardiogram IMPRESSIONS:   1. Left ventricular ejection fraction, by estimation, is 60 to 65%. The  left ventricle has normal function. The left ventricle has no regional  wall motion abnormalities. Left ventricular diastolic parameters were  normal.   2. Right ventricular systolic function is normal. The right ventricular  size is normal.   3. The mitral valve is normal in structure. No evidence of mitral valve  regurgitation. No evidence of mitral stenosis.   4. The tricuspid valve is abnormal.   5. The aortic valve is tricuspid. Aortic valve regurgitation is not  visualized. No aortic stenosis is present.   6. Aortic dilatation noted. 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   7. The inferior vena cava is normal in size with greater than 50%  respiratory variability, suggesting right atrial pressure of 3 mmHg.   Assessment and Plan: Principal Problem:   Acute respiratory failure with hypoxia (HCC) In the setting of:   Sarcoidosis Observation/telemetry As needed supplemental oxygen. Continue prednisone 40 mg p.o. daily in a.m. As needed bronchodilators. PCCM consult appreciated.   -Will follow the recommendations:  1)Check respiratory panel by PCR. 2)Check sputum Gram stain, C&S. 3) continue amoxicillin and prednisone. 4) follow-up with pulmonary in the next few days as scheduled.  Active Problems:   Epistaxis In the setting of:   Acute frontal sinusitis Continue Augmentin 875 mg p.o.  twice daily. Continue prednisone 40 mg p.o. x 5 days. May resume DOAC.    Bilateral pulmonary embolism (HCC) Continue rivaroxaban 20 mg p.o. daily.    Essential  hypertension Continue olmesartan 5 mg p.o. daily. Continue prazosin 1 mg p.o. at bedtime as needed.    Hyperlipidemia Continue rosuvastatin 10 mg p.o. daily.    GERD (gastroesophageal reflux disease) Continue pantoprazole 40 mg p.o. daily. Continue metoclopramide 5 mg p.o. twice daily. Follow-up with gastroenterology as scheduled or needed.    Advance Care Planning:   Code Status: Full Code   Consults:   Family Communication:   Severity of Illness: The appropriate patient status for this patient is OBSERVATION. Observation status is judged to be reasonable and necessary in order to provide the required intensity of service to ensure the patient's safety. The patient's presenting symptoms, physical exam findings, and initial radiographic and laboratory data in the context of their medical condition is felt to place them at decreased risk for further clinical deterioration. Furthermore, it is anticipated that the patient will be medically stable for discharge from the hospital within 2 midnights of admission.   Author: Bobette Mo, MD 12/06/2022 7:13 AM  For on call review [email or web address].   This document was prepared using Dragon voice recognition software and may contain some unintended transcription errors.

## 2022-12-06 NOTE — Plan of Care (Signed)
  Problem: Health Behavior/Discharge Planning: Goal: Ability to manage health-related needs will improve 12/06/2022 0520 by Malachi Carl, RN Outcome: Progressing 12/06/2022 0519 by Malachi Carl, RN Outcome: Progressing   Problem: Clinical Measurements: Goal: Ability to maintain clinical measurements within normal limits will improve Outcome: Progressing Goal: Will remain free from infection Outcome: Progressing Goal: Diagnostic test results will improve Outcome: Progressing Goal: Respiratory complications will improve Outcome: Progressing Goal: Cardiovascular complication will be avoided Outcome: Progressing   Problem: Activity: Goal: Risk for activity intolerance will decrease Outcome: Progressing   Problem: Nutrition: Goal: Adequate nutrition will be maintained 12/06/2022 0520 by Malachi Carl, RN Outcome: Progressing 12/06/2022 0519 by Malachi Carl, RN Outcome: Progressing   Problem: Coping: Goal: Level of anxiety will decrease 12/06/2022 0520 by Malachi Carl, RN Outcome: Progressing 12/06/2022 0519 by Malachi Carl, RN Outcome: Progressing   Problem: Elimination: Goal: Will not experience complications related to bowel motility 12/06/2022 0520 by Malachi Carl, RN Outcome: Progressing 12/06/2022 0519 by Malachi Carl, RN Outcome: Progressing Goal: Will not experience complications related to urinary retention 12/06/2022 0520 by Malachi Carl, RN Outcome: Progressing 12/06/2022 0519 by Malachi Carl, RN Outcome: Progressing   Problem: Pain Managment: Goal: General experience of comfort will improve 12/06/2022 0520 by Malachi Carl, RN Outcome: Progressing 12/06/2022 0519 by Malachi Carl, RN Outcome: Progressing   Problem: Safety: Goal: Ability to remain free from injury will improve 12/06/2022 0520 by Malachi Carl, RN Outcome: Progressing 12/06/2022 0519 by Malachi Carl, RN Outcome: Progressing   Problem: Skin Integrity: Goal: Risk for impaired skin integrity will decrease Outcome: Progressing

## 2022-12-06 NOTE — Progress Notes (Signed)
Reviewed discharge instructions with patient including meds and follow up appointments. Pt verbalized understanding. All questions answered. Brandley Aldrete, Yancey Flemings, RN

## 2022-12-06 NOTE — Discharge Summary (Signed)
Physician Discharge Summary   Patient: Hailey Ross MRN: [id number] DOB: [birth year]  Admit date:     12/05/2022  Discharge date: 12/06/22  Discharge Physician: Bobette Mo   PCP: Gilmore Laroche, FNP   Recommendations at discharge:   Follow-up with pulmonary as scheduled. Continue Augmentin and prednisone. 5 more days of therapy has been added to Augmentin twice daily.  Discharge Diagnoses: Active Problems:   Sarcoidosis   Bilateral pulmonary embolism (HCC)   Essential hypertension   Hyperlipidemia   GERD (gastroesophageal reflux disease)   Acute respiratory failure with hypoxia (HCC)   Chronic anticoagulation   Epistaxis   Acute frontal sinusitis  Hospital Course: Chief Complaint  Patient presents with   Cough    HPI: CAITLEN Ross is a 52 y.o. female with medical history significant of acute cholecystitis, cholecystectomy, history of AKI superimposed on stage IIIa CKD, iron deficiency, lightheadedness, bilateral pulmonary embolism, presyncope, history of cigarette smoking, sleep apnea, sarcoidosis, history of acute respiratory failure with hypoxia who presented to the emergency department with complaints of frontal headache, productive cough which is occasionally blood t tinged, and episode of epistaxis on Sunday evening, wheezing, chest congestion with tightness and heaviness for the past week.  She was seen at the urgent care yesterday and was given prescriptions for Augmentin and prednisone. He denied fever, chills or night sweats.  No palpitations, diaphoresis, PND, orthopnea or pitting edema of the lower extremities.  Occasional dyspepsia, following with GI and given metoclopramide recently.  No recent abdominal pain, nausea, emesis, diarrhea, constipation, melena or hematochezia.  No flank pain, dysuria, frequency or hematuria.  No polyuria, polydipsia, polyphagia or blurred vision.    Lab work: Coronavirus, influenza and RSV PCR was negative.  CBC showed a  white count of 18.5, hemoglobin 12.1 g/dL platelets 782.  BNP was normal.  CMP showed a glucose of 149 and creatinine 1.29 mg/deciliter.  The rest of the CMP measurements were normal after sodium was corrected.   Imaging: Two-view chest radiograph with chronic pulmonary interstitial changes.  No acute findings.  CTA chest showing chronic changes in the lungs consistent with clinical history of sarcoidosis.  No evidence to suggest PE.  Prominence of the ascending aorta at 3.9 cm.  Scattered hilar and mediastinal nodes likely related to the patient's known history of sarcoidosis.   ED course: Initial vital signs were temperature 98.7 F, pulse 90, respiration 20, BP 113/89 mmHg O2 sat 99% on room air.  The patient subsequently became hypoxic to 83% and was placed on nasal cannula oxygen at 2 LPM.   Review of Systems: As mentioned in the history of present illness. All other systems reviewed and are negative.  Assessment and Plan:   Acute respiratory failure with hypoxia (HCC) In the setting of:   Sarcoidosis Observation/telemetry As needed supplemental oxygen. Continue prednisone 40 mg p.o. daily in a.m. As needed bronchodilators. PCCM consult appreciated.   -Will follow the recommendations:  1)Check respiratory panel by PCR. 2)Check sputum Gram stain, C&S. 3) continue amoxicillin and prednisone. 4) follow-up with pulmonary in the next few days as scheduled.  Active Problems:   Epistaxis In the setting of:   Acute frontal sinusitis Continue Augmentin 875 mg p.o. twice daily. Continue prednisone 40 mg p.o. x 5 days. May resume DOAC.    Bilateral pulmonary embolism (HCC) Continue rivaroxaban 20 mg p.o. daily.    Essential hypertension Continue olmesartan 5 mg p.o. daily. Continue prazosin 1 mg p.o. at bedtime as needed.  Hyperlipidemia Continue rosuvastatin 10 mg p.o. daily.    GERD (gastroesophageal reflux disease) Continue pantoprazole 40 mg p.o. daily. Continue  metoclopramide 5 mg p.o. twice daily. Follow-up with gastroenterology as scheduled or needed.   Consultants: Levon Hedger, MD (PCCM). Procedures performed: None.  Disposition: Home Diet recommendation:  Cardiac diet DISCHARGE MEDICATION: Allergies as of 12/06/2022   No Known Allergies      Medication List     STOP taking these medications    metoprolol tartrate 100 MG tablet Commonly known as: LOPRESSOR   prazosin 1 MG capsule Commonly known as: MINIPRESS   zolpidem 10 MG tablet Commonly known as: AMBIEN       TAKE these medications    acetaminophen 500 MG tablet Commonly known as: TYLENOL Take 500 mg by mouth every 4 (four) hours as needed for mild pain, moderate pain or headache.   albuterol 108 (90 Base) MCG/ACT inhaler Commonly known as: VENTOLIN HFA Inhale 2 puffs into the lungs every 6 (six) hours as needed for wheezing or shortness of breath.   amoxicillin-clavulanate 875-125 MG tablet Commonly known as: AUGMENTIN Take 1 tablet by mouth 2 (two) times daily. What changed: when to take this   Breztri Aerosphere 160-9-4.8 MCG/ACT Aero Generic drug: Budeson-Glycopyrrol-Formoterol Inhale 2 puffs into the lungs in the morning and at bedtime.   diltiazem 30 MG tablet Commonly known as: CARDIZEM Take 1 tablet (30 mg total) by mouth daily as needed (For palpitations). May take an additional 30 mg tablet daily as needed for palpitations   LORazepam 0.5 MG tablet Commonly known as: ATIVAN Take 0.5 mg by mouth daily as needed for anxiety.   metoCLOPramide 5 MG tablet Commonly known as: Reglan Take 1 tablet (5 mg total) by mouth 2 (two) times daily.   olmesartan 5 MG tablet Commonly known as: BENICAR Take 5 mg by mouth daily.   pantoprazole 40 MG tablet Commonly known as: PROTONIX TAKE 1 TABLET(40 MG) BY MOUTH DAILY   predniSONE 20 MG tablet Commonly known as: DELTASONE Take 2 tablets (40 mg total) by mouth daily with breakfast for 5 days.    promethazine-dextromethorphan 6.25-15 MG/5ML syrup Commonly known as: PROMETHAZINE-DM Take 5 mLs by mouth 4 (four) times daily as needed.   rosuvastatin 10 MG tablet Commonly known as: CRESTOR Take 10 mg by mouth at bedtime.   Xarelto 20 MG Tabs tablet Generic drug: rivaroxaban TAKE 1 TABLET(20 MG) BY MOUTH DAILY WITH SUPPER        Discharge Exam: Filed Weights   12/06/22 0520  Weight: 95.8 kg   Physical Exam Vitals and nursing note reviewed.  Constitutional:      General: She is awake. She is not in acute distress.    Appearance: Normal appearance. She is obese. She is not ill-appearing.  HENT:     Head: Normocephalic.     Nose: No rhinorrhea.     Mouth/Throat:     Mouth: Mucous membranes are moist.  Eyes:     General: No scleral icterus.    Pupils: Pupils are equal, round, and reactive to light.  Neck:     Vascular: No JVD.  Cardiovascular:     Rate and Rhythm: Normal rate and regular rhythm.     Heart sounds: S1 normal and S2 normal.  Pulmonary:     Effort: Pulmonary effort is normal.     Breath sounds: Normal breath sounds. No wheezing, rhonchi or rales.  Abdominal:     General: Abdomen is protuberant. Bowel sounds are  normal. There is no distension.     Palpations: Abdomen is soft.     Tenderness: There is no abdominal tenderness. There is no guarding.  Musculoskeletal:     Cervical back: Neck supple.     Right lower leg: No edema.     Left lower leg: No edema.  Skin:    General: Skin is warm and dry.  Neurological:     General: No focal deficit present.     Mental Status: She is alert and oriented to person, place, and time.  Psychiatric:        Mood and Affect: Mood normal.        Behavior: Behavior normal. Behavior is cooperative.      Condition at discharge: good  The results of significant diagnostics from this hospitalization (including imaging, microbiology, ancillary and laboratory) are listed below for reference.   Imaging Studies: CT  Angio Chest PE W/Cm &/Or Wo Cm  Result Date: 12/05/2022 CLINICAL DATA:  Productive cough for 3 days, initial encounter EXAM: CT ANGIOGRAPHY CHEST WITH CONTRAST TECHNIQUE: Multidetector CT imaging of the chest was performed using the standard protocol during bolus administration of intravenous contrast. Multiplanar CT image reconstructions and MIPs were obtained to evaluate the vascular anatomy. RADIATION DOSE REDUCTION: This exam was performed according to the departmental dose-optimization program which includes automated exposure control, adjustment of the mA and/or kV according to patient size and/or use of iterative reconstruction technique. CONTRAST:  75mL OMNIPAQUE IOHEXOL 350 MG/ML SOLN COMPARISON:  None Available. FINDINGS: Cardiovascular: Ascending thoracic aorta is measuring up to 3.9 cm. Normal tapering in the aortic arch is noted. No dissection is seen. No cardiac enlargement is noted. The pulmonary artery shows a normal branching pattern bilaterally. No filling defect to suggest pulmonary embolism is noted. Mediastinum/Nodes: Thoracic inlet is within normal limits. Scattered lymph nodes are noted through the hila and mediastinum. Right hilar index node measures up to 16 mm in short axis. AP window node measures 14 mm in short axis. Prevascular nodes measure up to 9 mm in short axis. These are new from previous exams. The esophagus as visualized is within normal limits. Lungs/Pleura: Diffuse honeycombing is noted in the upper lobes bilaterally. Scattered fibrotic changes are seen similar to that noted on the prior exam. No acute infiltrate or sizable effusion is noted. Upper Abdomen: Visualized upper abdomen shows no acute abnormality Musculoskeletal: No chest wall abnormality. No acute or significant osseous findings. Review of the MIP images confirms the above findings. IMPRESSION: Chronic changes lungs consistent with the given clinical history of sarcoidosis. The overall appearance is stable from  June of 2024. No evidence to suggest pulmonary embolism. Prominence of the ascending aorta 3.9 cm. Scattered hilar and mediastinal nodes likely related to the patient's known history of sarcoidosis. Electronically Signed   By: Alcide Clever M.D.   On: 12/05/2022 23:11   DG Chest 2 View  Result Date: 12/05/2022 CLINICAL DATA:  cough, SOB, hx of sarcoidosis EXAM: CHEST - 2 VIEW COMPARISON:  05/17/2022 FINDINGS: Largely stable coarse biapical and perihilar coarse pulmonary interstitial opacities. No convincing new airspace disease or overt edema. Heart size and mediastinal contours are within normal limits. No effusion. Visualized bones unremarkable. IMPRESSION: Chronic pulmonary interstitial opacities. No acute findings. Electronically Signed   By: Corlis Leak M.D.   On: 12/05/2022 10:32   CT CORONARY MORPH W/CTA COR W/SCORE W/CA W/CM &/OR WO/CM  Addendum Date: 11/30/2022   ADDENDUM REPORT: 11/30/2022 11:33 EXAM: OVER-READ INTERPRETATION  CT CHEST  The following report is an over-read performed by radiologist Dr. Curly Shores [facility] on 11/30/2022. This over-read does not include interpretation of cardiac or coronary anatomy or pathology. The coronary CTA interpretation by the cardiologist is attached. COMPARISON:  09/15/2022 FINDINGS: Cardiovascular: No incidental pulmonary artery filling defects to suggest PE. See the body of the report for additional information. Mediastinum/Nodes: No suspicious adenopathy identified. Imaged mediastinal structures are unremarkable. Lungs/Pleura: Interstitial changes consistent pulmonary fibrosis at the bases as well as in the lingula and right middle lobe where there is also evidence of bronchiectatic changes. Findings appear similar to the prior study. No pleural effusion or pneumothorax. Upper Abdomen: No acute abnormality. Musculoskeletal: No chest wall abnormality. No acute or significant osseous findings. IMPRESSION: Pulmonary interstitial fibrosis  and bronchiectasis which was more fully evaluated on the recent prior study. Electronically Signed   By: Layla Maw M.D.   On: 11/30/2022 11:33   Result Date: 11/30/2022 CLINICAL DATA:  This is a 52 year old female with anginal symptoms. EXAM: Cardiac/Coronary  CTA TECHNIQUE: The patient was scanned on a [employer]. FINDINGS: A 100 kV prospective scan was triggered in the descending thoracic aorta at 111 HU's. Axial non-contrast 3 mm slices were carried out through the heart. The data set was analyzed on a dedicated work station and scored using the Agatson method. Gantry rotation speed was 250 msecs and collimation was .6 mm. No beta blockade and 0.8 mg of sl NTG was given. The 3D data set was reconstructed in 5% intervals of the 67-82 % of the R-R cycle. Diastolic phases were analyzed on a dedicated work station using MPR, MIP and VRT modes. The patient received 80 cc of contrast. Aorta: Dilatation of the proximal ascending aorta (42.7 mm). No calcifications. No dissection. Aortic Valve:  Trileaflet.  No calcifications. Coronary Arteries:  Normal coronary origin.  Right dominance. RCA is a large dominant artery that gives rise to PDA and PLA. There is no plaque. Left main is a large artery that gives rise to LAD and LCX arteries. LAD is a large vessel that has no plaque. LCX is a non-dominant artery that gives rise to one large OM1 branch. There is no plaque. Coronary Calcium Score: Left main: 0 Left anterior descending artery: 0 Left circumflex artery: 0 Right coronary artery: 0 Total: 0 Percentile: 0 Other findings: Normal pulmonary vein drainage into the left atrium. Normal left atrial appendage without a thrombus. Normal size of the pulmonary artery. IMPRESSION: 1. Coronary calcium score of 0. This was 0 percentile for age and sex matched control. 2. Normal coronary origin with right dominance. 3. CAD-RADS 0. No evidence of CAD (0%). Consider non-atherosclerotic causes of chest pain. The  noncardiac portion of this study will be interpreted in separate report by the radiologist. Electronically Signed: By: Thomasene Ripple D.O. On: 11/22/2022 14:17   ECHOCARDIOGRAM COMPLETE  Result Date: 11/30/2022    ECHOCARDIOGRAM REPORT   Patient Name:   JANELLY FAZEL Date of Exam: 11/29/2022 Medical Rec #:  [id number]         Height:       65.0 in Accession #:    [id number]        Weight:       214.8 lb Date of Birth:  06-17-1970         BSA:          2.039 m Patient Age:    67 years  BP:           146/90 mmHg Patient Gender: F                 HR:           72 bpm. Exam Location:  [facility] Procedure: 2D Echo, Cardiac Doppler and Color Doppler Indications:    R07.9* Chest pain, unspecified  History:        Patient has prior history of Echocardiogram examinations, most                 recent 10/19/2020. CTA-11/22/2022 Ascending Aorta 42.7 mm,                 Pulmonary HTN; Risk Factors:Hypertension, Sleep Apnea, Former                 Smoker and Dyslipidemia.  Sonographer:    Dominica Severin RCS, RVS Referring Phys: [phone] [facility]  Sonographer Comments: Global longitudinal strain was attempted. IMPRESSIONS  1. Left ventricular ejection fraction, by estimation, is 60 to 65%. The left ventricle has normal function. The left ventricle has no regional wall motion abnormalities. Left ventricular diastolic parameters were normal.  2. Right ventricular systolic function is normal. The right ventricular size is normal.  3. The mitral valve is normal in structure. No evidence of mitral valve regurgitation. No evidence of mitral stenosis.  4. The tricuspid valve is abnormal.  5. The aortic valve is tricuspid. Aortic valve regurgitation is not visualized. No aortic stenosis is present.  6. Aortic dilatation noted. There is mild dilatation of the ascending aorta, measuring 43 mm.  7. The inferior vena cava is normal in size with greater than 50% respiratory variability, suggesting right atrial pressure of 3 mmHg.  Comparison(s): EF 55-60%. No RWMA.  FINDINGS  Left Ventricle: Left ventricular ejection fraction, by estimation, is 60 to 65%. The left ventricle has normal function. The left ventricle has no regional wall motion abnormalities. The left ventricular internal cavity size was normal in size. There is  no left ventricular hypertrophy. Left ventricular diastolic parameters were normal. Right Ventricle: The right ventricular size is normal. Right vetricular wall thickness was not well visualized. Right ventricular systolic function is normal. Left Atrium: Left atrial size was normal in size. Right Atrium: Right atrial size was normal in size. Pericardium: There is no evidence of pericardial effusion. Mitral Valve: The mitral valve is normal in structure. No evidence of mitral valve regurgitation. No evidence of mitral valve stenosis. MV peak gradient, 3.2 mmHg. The mean mitral valve gradient is 1.0 mmHg. Tricuspid Valve: The tricuspid valve is abnormal. Tricuspid valve regurgitation is mild . No evidence of tricuspid stenosis. Aortic Valve: The aortic valve is tricuspid. Aortic valve regurgitation is not visualized. No aortic stenosis is present. Aortic valve mean gradient measures 5.0 mmHg. Aortic valve peak gradient measures 9.6 mmHg. Aortic valve area, by VTI measures 2.46 cm. Pulmonic Valve: The pulmonic valve was not well visualized. Pulmonic valve regurgitation is not visualized. No evidence of pulmonic stenosis. Aorta: Aortic dilatation noted and the aortic root is normal in size and structure. There is mild dilatation of the ascending aorta, measuring 43 mm. Venous: The inferior vena cava is normal in size with greater than 50% respiratory variability, suggesting right atrial pressure of 3 mmHg. IAS/Shunts: No atrial level shunt detected by color flow Doppler.  LEFT VENTRICLE PLAX 2D LVIDd:         4.70 cm     Diastology LVIDs:  2.90 cm     LV e' medial:    7.07 cm/s LV PW:         1.00 cm     LV E/e'  medial:  10.8 LV IVS:        1.00 cm     LV e' lateral:   14.00 cm/s LVOT diam:     2.10 cm     LV E/e' lateral: 5.5 LV SV:         77 LV SV Index:   38 LVOT Area:     3.46 cm  LV Volumes (MOD) LV vol d, MOD A2C: 45.2 ml LV vol d, MOD A4C: 76.6 ml LV vol s, MOD A2C: 17.6 ml LV vol s, MOD A4C: 27.6 ml LV SV MOD A2C:     27.6 ml LV SV MOD A4C:     76.6 ml LV SV MOD BP:      41.2 ml RIGHT VENTRICLE RV Basal diam:  3.30 cm RV Mid diam:    3.40 cm RV S prime:     13.40 cm/s TAPSE (M-mode): 2.6 cm LEFT ATRIUM           Index        RIGHT ATRIUM           Index LA diam:      3.70 cm 1.81 cm/m   RA Area:     13.10 cm LA Vol (A2C): 24.4 ml 11.97 ml/m  RA Volume:   27.20 ml  13.34 ml/m LA Vol (A4C): 35.6 ml 17.46 ml/m  AORTIC VALVE                     PULMONIC VALVE AV Area (Vmax):    2.66 cm      PV Vmax:       1.23 m/s AV Area (Vmean):   2.36 cm      PV Peak grad:  6.0 mmHg AV Area (VTI):     2.46 cm AV Vmax:           155.00 cm/s AV Vmean:          105.000 cm/s AV VTI:            0.314 m AV Peak Grad:      9.6 mmHg AV Mean Grad:      5.0 mmHg LVOT Vmax:         119.00 cm/s LVOT Vmean:        71.400 cm/s LVOT VTI:          0.223 m LVOT/AV VTI ratio: 0.71  AORTA Ao Root diam: 3.40 cm Ao Asc diam:  4.30 cm MITRAL VALVE               TRICUSPID VALVE MV Area (PHT): 3.26 cm    TR Peak grad:   49.0 mmHg MV Area VTI:   2.75 cm    TR Vmax:        350.00 cm/s MV Peak grad:  3.2 mmHg MV Mean grad:  1.0 mmHg    SHUNTS MV Vmax:       0.89 m/s    Systemic VTI:  0.22 m MV Vmean:      55.6 cm/s   Systemic Diam: 2.10 cm MV Decel Time: 233 msec MV E velocity: 76.70 cm/s MV A velocity: 75.80 cm/s MV E/A ratio:  1.01 Dina Rich MD Electronically signed by Dina Rich MD Signature Date/Time: 11/30/2022/10:06:16 AM    Final  Exercise Tolerance Test  Result Date: 11/16/2022   Patient exercised for 2 minutes 53 seconds, according to Bruce protocol, achieving 4.60 METS. Heart rate increased from 65 bpm to 122 bpm which is  72% of maximal, age-predicted heart rate. Test was stopped due to dyspnea. No angina during the test. Poor exercise capacity. Duke Treadmill score is +2.5, medium risk.   Stress ECG is negative for arrhythmias but recovery ECG is positive for occasional PVCs.   Overall, non-diagnostic stress ECG due to failure to achieve 85% of maximal, age-predicted heart rate. Consider alternative testing like Nuclear stress test or CTA cardiac for evaluation of chest pain.    Microbiology: Results for orders placed or performed during the hospital encounter of 12/05/22  Resp panel by RT-PCR (RSV, Flu A&B, Covid) Anterior Nasal Swab     Status: None   Collection Time: 12/05/22  8:44 PM   Specimen: Anterior Nasal Swab  Result Value Ref Range Status   SARS Coronavirus 2 by RT PCR NEGATIVE NEGATIVE Final    Comment: (NOTE) SARS-CoV-2 target nucleic acids are NOT DETECTED.  The SARS-CoV-2 RNA is generally detectable in upper respiratory specimens during the acute phase of infection. The lowest concentration of SARS-CoV-2 viral copies this assay can detect is 138 copies/mL. A negative result does not preclude SARS-Cov-2 infection and should not be used as the sole basis for treatment or other patient management decisions. A negative result may occur with  improper specimen collection/handling, submission of specimen other than nasopharyngeal swab, presence of viral mutation(s) within the areas targeted by this assay, and inadequate number of viral copies(<138 copies/mL). A negative result must be combined with clinical observations, patient history, and epidemiological information. The expected result is Negative.  Fact Sheet for Patients:  BloggerCourse.com  Fact Sheet for Healthcare Providers:  SeriousBroker.it  This test is no t yet approved or cleared by the [place] FDA and  has been authorized for detection and/or diagnosis of SARS-CoV-2 by FDA  under an Emergency Use Authorization (EUA). This EUA will remain  in effect (meaning this test can be used) for the duration of the COVID-19 declaration under Section 564(b)(1) of the Act, 21 U.S.C.section 360bbb-3(b)(1), unless the authorization is terminated  or revoked sooner.       Influenza A by PCR NEGATIVE NEGATIVE Final   Influenza B by PCR NEGATIVE NEGATIVE Final    Comment: (NOTE) The Xpert Xpress SARS-CoV-2/FLU/RSV plus assay is intended as an aid in the diagnosis of influenza from Nasopharyngeal swab specimens and should not be used as a sole basis for treatment. Nasal washings and aspirates are unacceptable for Xpert Xpress SARS-CoV-2/FLU/RSV testing.  Fact Sheet for Patients: BloggerCourse.com  Fact Sheet for Healthcare Providers: SeriousBroker.it  This test is not yet approved or cleared by the [place] FDA and has been authorized for detection and/or diagnosis of SARS-CoV-2 by FDA under an Emergency Use Authorization (EUA). This EUA will remain in effect (meaning this test can be used) for the duration of the COVID-19 declaration under Section 564(b)(1) of the Act, 21 U.S.C. section 360bbb-3(b)(1), unless the authorization is terminated or revoked.     Resp Syncytial Virus by PCR NEGATIVE NEGATIVE Final    Comment: (NOTE) Fact Sheet for Patients: BloggerCourse.com  Fact Sheet for Healthcare Providers: SeriousBroker.it  This test is not yet approved or cleared by the [place] FDA and has been authorized for detection and/or diagnosis of SARS-CoV-2 by FDA under an Emergency Use Authorization (EUA). This EUA will remain in  effect (meaning this test can be used) for the duration of the COVID-19 declaration under Section 564(b)(1) of the Act, 21 U.S.C. section 360bbb-3(b)(1), unless the authorization is terminated or revoked.  Performed at [facility], [address] [place], [place] [address]   Culture, blood (Routine X 2) w Reflex to ID Panel     Status: None (Preliminary result)   Collection Time: 12/06/22  7:28 AM   Specimen: BLOOD RIGHT ARM  Result Value Ref Range Status   Specimen Description   Final    BLOOD RIGHT ARM Performed at [facility], [address] [place], [place] [address]    Special Requests   Final    BOTTLES DRAWN AEROBIC AND ANAEROBIC Blood Culture adequate volume Performed at [facility], [address], [place], [place] [address]    Culture PENDING  Incomplete   Report Status PENDING  Incomplete  Culture, blood (Routine X 2) w Reflex to ID Panel     Status: None (Preliminary result)   Collection Time: 12/06/22  7:28 AM   Specimen: BLOOD LEFT HAND  Result Value Ref Range Status   Specimen Description   Final    BLOOD LEFT HAND Performed at [facility], [address], [place], [place] [address]    Special Requests   Final    BOTTLES DRAWN AEROBIC AND ANAEROBIC Blood Culture adequate volume Performed at [facility], [address] [place], [place] [address]    Culture PENDING  Incomplete   Report Status PENDING  Incomplete    Labs: CBC: Recent Labs  Lab 12/05/22 2159  WBC 18.5*  NEUTROABS 15.5*  HGB 12.1  HCT 36.8  MCV 84.0  PLT 338   Basic Metabolic Panel: Recent Labs  Lab 12/05/22 2159  NA 134*  K 4.1  CL 100  CO2 22  GLUCOSE 149*  BUN 17  CREATININE 1.28*  CALCIUM 10.0   Liver Function Tests: Recent Labs  Lab 12/05/22 2159  AST 32  ALT 27  ALKPHOS 89  BILITOT 0.7  PROT 7.5  ALBUMIN 4.0   CBG: No results for input(s): "GLUCAP" in the last 168 hours.  Discharge time spent: less than 30 minutes.  Signed: Bobette Mo, MD [facility] 12/06/2022  This document was prepared using Dragon voice recognition software and may contain some unintended transcription errors.

## 2022-12-07 ENCOUNTER — Telehealth: Payer: Self-pay

## 2022-12-07 NOTE — Transitions of Care (Post Inpatient/ED Visit) (Signed)
12/07/2022  Name: Hailey Ross MRN: [id number] DOB: 06/17/1970  Today's TOC FU Call Status: Today's TOC FU Call Status:: Successful TOC FU Call Completed TOC FU Call Complete Date: 12/07/22 Patient's Name and Date of Birth confirmed.  Transition Care Management Follow-up Telephone Call Date of Discharge: 12/06/22 Discharge Facility: [facility]) Type of Discharge: Inpatient Admission Primary Inpatient Discharge Diagnosis:: hypoxemia How have you been since you were released from the hospital?: Better Any questions or concerns?: No  Items Reviewed: Did you receive and understand the discharge instructions provided?: Yes Medications obtained,verified, and reconciled?: Yes (Medications Reviewed) Any new allergies since your discharge?: No Dietary orders reviewed?: Yes Do you have support at home?: Yes People in Home: spouse  Medications Reviewed Today: Medications Reviewed Today     Reviewed by Karena Addison, LPN (Licensed Practical Nurse) on 12/07/22 at 1133  Med List Status: <None>   Medication Order Taking? Sig Documenting Provider Last Dose Status Informant  acetaminophen (TYLENOL) 500 MG tablet [id number] No Take 500 mg by mouth every 4 (four) hours as needed for mild pain, moderate pain or headache.  [provider] 12/05/2022 Active Self, Pharmacy Records  albuterol (VENTOLIN HFA) 108 (90 Base) MCG/ACT inhaler [id number] No Inhale 2 puffs into the lungs every 6 (six) hours as needed for wheezing or shortness of breath. Leath-Warren, Sadie Haber, NP UNk Active Self, Pharmacy Records  amoxicillin-clavulanate (AUGMENTIN) 875-125 MG tablet [id number]  Take 1 tablet by mouth 2 (two) times daily. Bobette Mo, MD  Active   Budeson-Glycopyrrol-Formoterol [facility] AEROSPHERE) 160-9-4.8 MCG/ACT Sandrea Matte [id number] No Inhale 2 puffs into the lungs in the morning and at bedtime. Coralyn Helling, MD Past Week Active Self, Pharmacy Records  diltiazem (CARDIZEM) 30 MG tablet  [id number] No Take 1 tablet (30 mg total) by mouth daily as needed (For palpitations). May take an additional 30 mg tablet daily as needed for palpitations Sharlene Dory, NP Taking Active Self, Pharmacy Records  LORazepam (ATIVAN) 0.5 MG tablet [id number] No Take 0.5 mg by mouth daily as needed for anxiety. [provider] Lajoyce Lauber Self, Pharmacy Records  metoCLOPramide (REGLAN) 5 MG tablet [id number] No Take 1 tablet (5 mg total) by mouth 2 (two) times daily. Zehr, Princella Pellegrini, PA-C Past Week Active Self, Pharmacy Records  olmesartan [facility]) 5 MG tablet [id number] No Take 5 mg by mouth daily. [provider] Past Week Active Self, Pharmacy Records  pantoprazole (PROTONIX) 40 MG tablet [id number] No TAKE 1 TABLET(40 MG) BY MOUTH DAILY Parrett, Tammy S, NP Past Week Active Self, Pharmacy Records  predniSONE (DELTASONE) 20 MG tablet [id number] No Take 2 tablets (40 mg total) by mouth daily with breakfast for 5 days. Abran Cantor, NP 12/05/2022 Active Self, Pharmacy Records           Med Note Gemma Payor Dec 06, 2022  9:07 AM) Mora Appl 08/26.   promethazine-dextromethorphan (PROMETHAZINE-DM) 6.25-15 MG/5ML syrup [id number] No Take 5 mLs by mouth 4 (four) times daily as needed. Abran Cantor, NP 12/05/2022 Active Self, Pharmacy Records  rosuvastatin (CRESTOR) 10 MG tablet [id number] No Take 10 mg by mouth at bedtime. [provider] Past Week Active Self, Pharmacy Records  XARELTO 20 MG TABS tablet [id number] No TAKE 1 TABLET(20 MG) BY MOUTH DAILY WITH SUPPER Parrett, Virgel Bouquet, NP 12/04/2022 2000 Active Self, Pharmacy Records            Home Care and Equipment/Supplies: Were Home Health Services Ordered?: NA Any new equipment or  medical supplies ordered?: NA  Functional Questionnaire: Do you need assistance with bathing/showering or dressing?: No Do you need assistance with meal preparation?: No Do you need assistance with eating?: No Do you  have difficulty maintaining continence: No Do you need assistance with getting out of bed/getting out of a chair/moving?: No Do you have difficulty managing or taking your medications?: No  Follow up appointments reviewed: PCP Follow-up appointment confirmed?: NA Specialist Hospital Follow-up appointment confirmed?: Yes Date of Specialist follow-up appointment?: 12/13/22 Follow-Up Specialty Provider:: pulmo Do you need transportation to your follow-up appointment?: No Do you understand care options if your condition(s) worsen?: Yes-patient verbalized understanding    SIGNATURE Karena Addison, LPN [facility] Nurse Health Advisor Direct Dial [phone]

## 2022-12-11 LAB — CULTURE, BLOOD (ROUTINE X 2)
Culture: NO GROWTH
Culture: NO GROWTH
Special Requests: ADEQUATE
Special Requests: ADEQUATE

## 2022-12-13 ENCOUNTER — Ambulatory Visit (INDEPENDENT_AMBULATORY_CARE_PROVIDER_SITE_OTHER): Payer: BC Managed Care – PPO | Admitting: Pulmonary Disease

## 2022-12-13 ENCOUNTER — Encounter (HOSPITAL_BASED_OUTPATIENT_CLINIC_OR_DEPARTMENT_OTHER): Payer: Self-pay | Admitting: Pulmonary Disease

## 2022-12-13 VITALS — BP 118/86 | HR 78 | Resp 18 | Ht 65.0 in | Wt 213.7 lb

## 2022-12-13 DIAGNOSIS — D860 Sarcoidosis of lung: Secondary | ICD-10-CM | POA: Diagnosis not present

## 2022-12-13 MED ORDER — PREDNISONE 5 MG PO TABS: ORAL_TABLET | ORAL | 0 refills | Status: AC

## 2022-12-13 MED ORDER — AZELASTINE HCL 0.1 % NA SOLN: 1.0000 | Freq: Every day | NASAL | 12 refills | Status: DC

## 2022-12-13 MED ORDER — METHOTREXATE SODIUM 2.5 MG PO TABS: ORAL_TABLET | ORAL | 0 refills | Status: DC

## 2022-12-13 NOTE — Patient Instructions (Signed)
Prednisone 5 mg pill >> 4 pills daily for 1 week, then 3 pills daily for 1 week, then 2 pills daily for 1 week, then 1 pill daily for 1 week, then stop prednisone.  Methotrexate 2.5 mg pill >> take 2 pill weekly for 2 weeks, then 3 pills weekly for 2 weeks, then 4 pills weekly and stay on this until your next appointment.  Take folic acid daily.  Astepro 1 spray in each nostril daily.  Follow up in 6 weeks with Tammy Parrett.

## 2022-12-13 NOTE — Progress Notes (Signed)
[place]  Chief Complaint  Patient presents with   Follow-up    Patient on CPAP-she didn't bring card-just started back using it.     Constitutional:  BP 118/86   Pulse 78   Resp 18   Ht 5\' 5"  (1.651 m)   Wt 213 lb 11.2 oz (96.9 kg)   LMP 04/01/2021 (Exact Date)   SpO2 98%   BMI 35.56 kg/m   Past Medical History:  CKD, Anemia, PE March 2021  Past Surgical History:  Her  has a past surgical history that includes Tubal ligation (02/1993) and Cholecystectomy (N/A, 08/25/2016).  Brief Summary:  Hailey Ross is a 52 y.o. female former smoker with pulmonary sarcoidosis, obstructive sleep apnea, and allergic rhinitis.      Subjective:   Saw Tammy Parrett in July.  Started on methotrexate.  She didn't realize she needed to continue this, and has been off this for the past two weeks.  She was seen in hospital in August 2024 for hemoptysis.  CT chest showed chronic changes of sarcoidosis.  She has lots of sinus congestion and was having nose bleeds.  Still has congestion.  Hasn't coughed up blood for the past 3 days.  Has several more days of augmentin left.  Physical Exam:   Appearance - well kempt   ENMT - no sinus tenderness, no oral exudate, no LAN, Mallampati 3 airway, no stridor, wears dentures, deviated septum  Respiratory - equal breath sounds bilaterally, no wheezing or rales  CV - s1s2 regular rate and rhythm, no murmurs  Ext - no clubbing, no edema  Skin - no rashes  Psych - normal mood and affect   Pulmonary testing:  Labs 04/21/15 >> HIV negative, Quantiferon gold negative, ACE 195, ANA, RF negative, ESR 32 PFT 05/26/15 >> FEV1 1.67 (66%), FEV1% 88, TLC 2/78 (53%), DLCO 36% PFT 09/26/19 >> FEV1 1.33 (54%), FEV1% 91, TLC 3.17 (61%), DLCO 45% PFT 06/20/22 >> FEV1 1.04 (36%), FEV1% 83, TLC 3.23 (62%), DLCO 32%  Chest Imaging:  CT chest 03/18/15 >> biapical thickening with traction BTX, BTX RML and lingula,  subcarinal LAN 1.7 cm, patchy increased interstitial markings HRCT chest 03/07/18 >> borderline LAN, patchy GGO, septal thickening, architectural distortion, cylindrical and varicose BTX CT angio chest 06/18/19 >> peripheral and subsegmental PEs, bulky LAN CT angio chest 07/15/19 >> apical fibrosis, no change in LAN CT angio chest 12/23/20 >> apical fibrosis with honeycombing and traction BTX HRCT chest 09/21/22 >> ascending thoracic aorta 4.5 cm; patchy GGO, septal thickening, subpleural reticulation, BTX and scattered honeycombing (chronic sarcoid with mild progression)  Sleep Tests:  HST 07/23/19 >> AHI 9.5, SpO2 low 74% Auto CPAP 07/05/21 to 08/03/21 >> used on 13 of 30 nights with average 3 hrs 45 minutes.  Average AHI 0.1 with median CPAP 8 and 95 th percentile CPAP 12 cm H2O  Cardiac Tests:  Echo 11/29/22 >> EF 60 to 65%, aortic root 43 mm  Social History:  She  reports that she quit smoking about 7 years ago. Her smoking use included cigarettes. She started smoking about 27 years ago. She has a 20 pack-year smoking history. She has been exposed to tobacco smoke. She has never used smokeless tobacco. She reports that she does not currently use alcohol. She reports that she does not use drugs.  Family History:  Her family history includes Cancer in her mother; Colon polyps in her mother; Healthy in her daughter, sister, sister, sister,  sister, son, and son; Hypertension in her mother.     Assessment/Plan:   Fibrotic pulmonary sarcoidosis. - discussed that methotrexate is a maintenance medication and will need to be resumed once she finishes antibiotics for sinus infection - will start her back on prednisone 20 mg daily and taper off over the next several weeks - will start methotrexate 5 mg weekly and increase as tolerate to 10 mg weekly - she is to take 1 mg folic acid daily - continue breztri two puffs bid  Obstructive sleep apnea. - she is compliant with therapy and reports benefit  from CPAP - uses [employer] for her DME - continue auto CPAP 5 to 15 cm H2O  Upper airway cough with post nasal drip with deviated nasal septum. 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 - remains on life long xarelto - followed by Dr. Ellin Saba with Hematology/Oncology  Positive ANA. - has progressive joint stiffness - follow up with Dr. Pollyann Savoy with rheumatology  CKD 2. - followed by Dr. Wolfgang Phoenix with nephrology  Time Spent Involved in Patient Care on Day of Examination:  37 minutes  Follow up:   Patient Instructions  Prednisone 5 mg pill >> 4 pills daily for 1 week, then 3 pills daily for 1 week, then 2 pills daily for 1 week, then 1 pill daily for 1 week, then stop prednisone.  Methotrexate 2.5 mg pill >> take 2 pill weekly for 2 weeks, then 3 pills weekly for 2 weeks, then 4 pills weekly and stay on this until your next appointment.  Take folic acid daily.  Astepro 1 spray in each nostril daily.  Follow up in 6 weeks with Tammy Parrett.  Medication List:   Allergies as of 12/13/2022   No Known Allergies      Medication List        Accurate as of December 13, 2022 12:15 PM. If you have any questions, ask your nurse or doctor.          acetaminophen 500 MG tablet Commonly known as: TYLENOL Take 500 mg by mouth every 4 (four) hours as needed for mild pain, moderate pain or headache.   albuterol 108 (90 Base) MCG/ACT inhaler Commonly known as: VENTOLIN HFA Inhale 2 puffs into the lungs every 6 (six) hours as needed for wheezing or shortness of breath.   amoxicillin-clavulanate 875-125 MG tablet Commonly known as: AUGMENTIN Take 1 tablet by mouth 2 (two) times daily.   azelastine 0.1 % nasal spray Commonly known as: ASTELIN Place 1 spray into both nostrils daily at 2 am. Use in each nostril as directed Started by: Caralyn Guile Aerosphere 160-9-4.8 MCG/ACT Aero Generic drug: Budeson-Glycopyrrol-Formoterol Inhale 2 puffs into the lungs in the morning and at bedtime.   diltiazem 30 MG tablet Commonly known as: CARDIZEM Take 1 tablet (30 mg total) by mouth daily as needed (For palpitations). May take an additional 30 mg tablet daily as needed for palpitations   LORazepam 0.5 MG tablet Commonly known as: ATIVAN Take 0.5 mg by mouth daily as needed for anxiety.   methotrexate 2.5 MG tablet Commonly known as: RHEUMATREX Take 1 tablet (2.5 mg total) by mouth once a week for 14 days, THEN 3 tablets (7.5 mg total) once a week for 14 days, THEN 4 tablets (10 mg total) once a week for 14 days. Caution:Chemotherapy. Protect from light.Marland Kitchen  Start taking on: December 13, 2022 Started by: Coralyn Helling   metoCLOPramide 5 MG tablet Commonly known as: Reglan Take 1 tablet (5 mg total) by mouth 2 (two) times daily.   olmesartan 5 MG tablet Commonly known as: BENICAR Take 5 mg by mouth daily.   pantoprazole 40 MG tablet Commonly known as: PROTONIX TAKE 1 TABLET(40 MG) BY MOUTH DAILY   predniSONE 5 MG tablet Commonly known as: DELTASONE Take 4 tablets (20 mg total) by mouth daily with breakfast for 7 days, THEN 3 tablets (15 mg total) daily with breakfast for 7 days, THEN 2 tablets (10 mg total) daily with breakfast for 7 days, THEN 1 tablet (5 mg total) daily with breakfast for 7 days. Start taking on: December 13, 2022 Started by: Coralyn Helling   promethazine-dextromethorphan 6.25-15 MG/5ML syrup Commonly known as: PROMETHAZINE-DM Take 5 mLs by mouth 4 (four) times daily as needed.   rosuvastatin 10 MG tablet Commonly known as: CRESTOR Take 10 mg by mouth at bedtime.   Xarelto 20 MG Tabs tablet Generic drug: rivaroxaban TAKE 1 TABLET(20 MG) BY MOUTH DAILY WITH SUPPER        Signature:  Coralyn Helling, MD [facility] Pager - [phone] 12/13/2022, 12:15 PM

## 2022-12-16 ENCOUNTER — Telehealth: Payer: Self-pay | Admitting: Pulmonary Disease

## 2022-12-16 NOTE — Telephone Encounter (Signed)
Received a request from [employer] for recent office notes - faxed 12/06/22 hospital discharge summary and 12/13/22 office notes from appointment with Dr. Craige Cotta.   Fax# [phone]

## 2022-12-16 NOTE — Progress Notes (Unsigned)
[facility] [address], [place] [address]   CLINIC:  Medical Oncology/Hematology  PCP:  Gilmore Laroche, FNP [address] [place] [address] [phone]   REASON FOR VISIT:  Follow-up for unprovoked pulmonary embolism   CURRENT THERAPY: Xarelto  INTERVAL HISTORY:   [name] 52 y.o. female returns for routine follow-up of unprovoked pulmonary embolism.  She was last seen by Rojelio Brenner PA-C on 12/27/2021.  Since her last visit, she was hospitalized from 12/05/2022 through 12/06/2022 for acute hypoxic respiratory failure in the setting of sarcoidosis; CTA was negative for acute PE.  At today's visit, she reports feeling well.  She denies any symptoms of recurrent DVT or PE.  She has intermittent knee swelling, but denies any other peripheral edema, pain, or erythema.  She has intermittent cough and shortness of breath from her sarcoidosis but denies any changes in her baseline respiratory status; she continues to take daily Breztri and is currently on prednisone taper as well.  No chest pain or palpitations.  She continues to take Xarelto daily as prescribed.  She has occasional rare nosebleeds; no rectal bleeding or melena.  She has 100% energy and 100% appetite. She endorses that she is maintaining a stable weight.   ASSESSMENT & PLAN:  1.  Unprovoked bilateral pulmonary embolism - Presentation with right knee pain and swelling and on and off lightheadedness since January 2021.  Presented to the ER on 06/18/2019 with worsening lightheadedness and coughing up blood-streaked sputum. - CT angiogram on 06/18/2019 showed peripheral segmental and subsegmental bilateral pulmonary emboli with no right heart strain.  Bulky mediastinal and hilar lymph nodes and pulmonary findings consistent with history of sarcoidosis. - Bilateral leg Doppler was negative on the same day for DVT. - Lupus anticoagulant was positive x1, negative on repeat testing.   Anticardiolipin antibodies negative.  Factor V Leiden and PT gene negative. - Indefinite anticoagulation was recommended. - She is taking Xarelto without any major bleeding issues. - Hospitalized for hypoxic respiratory failure in August 2024, but CTA at that time was negative for PE.   - Most recent labs (12/19/2022) shows normal D-dimer.  No anemia.  Mild leukocytosis on CBC/D likely secondary to current prednisone taper). - No current symptoms of recurrent DVT or PE.  - PLAN: Continue indefinite Xarelto, as benefits of anticoagulation continue to outweigh risks at this time.  We will discharge patient from clinic, with instructions for PCP follow-up and to return to clinic if she is considering stopping blood thinner for any reason, is having any major bleeding episodes, or has any recurrent VTE.    2.  Other history -Sarcoidosis: Diagnosed January 2018, treated with prednisone for 1.5 years.  She is now weaned off of prednisone and use inhaler.  She follows with Dr. Craige Cotta. - Other PMH significant for CKD (follows with Dr. Wolfgang Phoenix), positive ANA (follows with Dr. Corliss Skains) - She is a former smoker.  Smoked 1 PPD x 22 years, quit 5+ years ago  PLAN SUMMARY: >> Tentative discharge to PCP     REVIEW OF SYSTEMS:   Review of Systems  Constitutional:  Negative for appetite change, chills, diaphoresis, fatigue, fever and unexpected weight change.  HENT:   Negative for lump/mass and nosebleeds.   Eyes:  Negative for eye problems.  Respiratory:  Positive for cough and shortness of breath (sarcoidosis). Negative for hemoptysis.   Cardiovascular:  Negative for chest pain, leg swelling and palpitations.  Gastrointestinal:  Negative for abdominal  pain, blood in stool, constipation, diarrhea, nausea and vomiting.  Genitourinary:  Negative for hematuria.   Skin: Negative.   Neurological:  Positive for dizziness. Negative for headaches and light-headedness.  Hematological:  Does not bruise/bleed easily.      PHYSICAL EXAM:  ECOG PERFORMANCE STATUS: 1 - Symptomatic but completely ambulatory  Vitals:   12/19/22 1000  BP: 111/89  Pulse: 60  Resp: 18  Temp: 98.7 F (37.1 C)  SpO2: 100%   Filed Weights   12/19/22 1000  Weight: 218 lb 0.6 oz (98.9 kg)   Physical Exam Constitutional:      Appearance: Normal appearance. She is obese.  Cardiovascular:     Heart sounds: Normal heart sounds.  Pulmonary:     Breath sounds: Normal breath sounds.  Neurological:     General: No focal deficit present.     Mental Status: Mental status is at baseline.  Psychiatric:        Behavior: Behavior normal. Behavior is cooperative.     PAST MEDICAL/SURGICAL HISTORY:  Past Medical History:  Diagnosis Date   Acute cholecystitis 08/24/2016   Acute kidney injury superimposed on chronic kidney disease (HCC) 06/20/2019   Acute respiratory failure with hypoxia (HCC) 06/19/2019   Annual visit for general adult medical examination with abnormal findings 04/16/2019   Arthritis    right knee   Bronchiectasis (HCC)    CKD (chronic kidney disease)    Iron deficiency    Lightheadedness 05/10/2019   PE (pulmonary thromboembolism) (HCC) 06/2019   Pre-syncope 04/16/2019   Sarcoidosis    Sarcoidosis    Sleep apnea    c pap   Smoker 02/12/2014   Past Surgical History:  Procedure Laterality Date   CHOLECYSTECTOMY N/A 08/25/2016   Procedure: LAPAROSCOPIC CHOLECYSTECTOMY;  Surgeon: Abigail Miyamoto, MD;  Location: [facility] OR;  Service: General;  Laterality: N/A;   TUBAL LIGATION  02/1993    SOCIAL HISTORY:  Social History   Socioeconomic History   Marital status: Married    Spouse name: Not on file   Number of children: 3   Years of education: Not on file   Highest education level: High school graduate  Occupational History   Occupation: Administrator, [employer] ([employer])  Tobacco Use   Smoking status: Former    Current packs/day: 0.00    Average packs/day: 1 pack/day for 20.0 years (20.0 ttl pk-yrs)    Types:  Cigarettes    Start date: 03/21/1995    Quit date: 03/21/2015    Years since quitting: 7.7    Passive exposure: Past   Smokeless tobacco: Never  Vaping Use   Vaping status: Never Used  Substance and Sexual Activity   Alcohol use: Not Currently    Comment: Rare   Drug use: Never   Sexual activity: Yes    Birth control/protection: Surgical  Other Topics Concern   Not on file  Social History Narrative   Live with partners [name]-19 years   3 children.    1-[name] (girl) 27 at home with her: 2 grandchildren in her home as well    [name]- 26 expecting   [name]-25      Enjoy: read, play games on tablet, grandkids      Diet: Veggies, does not eat a lot of fried foods, enjoys chicken and fruit   Caffeine: sodas and coffee (with sugar)   Water: Does not drink a lot at all      Wears seat beat   Does not wear sunscreen   Smoke  and carbon monoxide detectors   Does not use phone while driving    Social Determinants of Health   Financial Resource Strain: Low Risk  (10/10/2018)   Overall Financial Resource Strain (CARDIA)    Difficulty of Paying Living Expenses: Not hard at all  Food Insecurity: No Food Insecurity (12/06/2022)   Hunger Vital Sign    Worried About Running Out of Food in the Last Year: Never true    Ran Out of Food in the Last Year: Never true  Transportation Needs: No Transportation Needs (12/06/2022)   PRAPARE - Administrator, Civil Service (Medical): No    Lack of Transportation (Non-Medical): No  Physical Activity: Sufficiently Active (10/10/2018)   Exercise Vital Sign    Days of Exercise per Week: 7 days    Minutes of Exercise per Session: 60 min  Stress: No Stress Concern Present (10/10/2018)   [employer] of Occupational Health - Occupational Stress Questionnaire    Feeling of Stress : Not at all  Social Connections: Unknown (08/24/2021)   [facility] from [facility]   Social Network    Social Network: Not on file  Intimate Partner Violence:  Not At Risk (12/06/2022)   Humiliation, Afraid, Rape, and Kick questionnaire    Fear of Current or Ex-Partner: No    Emotionally Abused: No    Physically Abused: No    Sexually Abused: No    FAMILY HISTORY:  Family History  Problem Relation Age of Onset   Hypertension Mother    Cancer Mother    Colon polyps Mother    Healthy Sister    Healthy Sister    Healthy Sister    Healthy Sister    Healthy Son    Healthy Son    Healthy Daughter    Colon cancer Neg Hx    Esophageal cancer Neg Hx    Rectal cancer Neg Hx    Stomach cancer Neg Hx     CURRENT MEDICATIONS:  Outpatient Encounter Medications as of 12/19/2022  Medication Sig   acetaminophen (TYLENOL) 500 MG tablet Take 500 mg by mouth every 4 (four) hours as needed for mild pain, moderate pain or headache.    albuterol (VENTOLIN HFA) 108 (90 Base) MCG/ACT inhaler Inhale 2 puffs into the lungs every 6 (six) hours as needed for wheezing or shortness of breath.   amoxicillin-clavulanate (AUGMENTIN) 875-125 MG tablet Take 1 tablet by mouth 2 (two) times daily.   azelastine (ASTELIN) 0.1 % nasal spray Place 1 spray into both nostrils daily at 2 am. Use in each nostril as directed   Budeson-Glycopyrrol-Formoterol (BREZTRI AEROSPHERE) 160-9-4.8 MCG/ACT AERO Inhale 2 puffs into the lungs in the morning and at bedtime.   diltiazem (CARDIZEM) 30 MG tablet Take 1 tablet (30 mg total) by mouth daily as needed (For palpitations). May take an additional 30 mg tablet daily as needed for palpitations   LORazepam (ATIVAN) 0.5 MG tablet Take 0.5 mg by mouth daily as needed for anxiety.   methotrexate (RHEUMATREX) 2.5 MG tablet Take 1 tablet (2.5 mg total) by mouth once a week for 14 days, THEN 3 tablets (7.5 mg total) once a week for 14 days, THEN 4 tablets (10 mg total) once a week for 14 days. Caution:Chemotherapy. Protect from light..   metoCLOPramide (REGLAN) 5 MG tablet Take 1 tablet (5 mg total) by mouth 2 (two) times daily.   olmesartan  (BENICAR) 5 MG tablet Take 5 mg by mouth daily.   pantoprazole (PROTONIX) 40 MG  tablet TAKE 1 TABLET(40 MG) BY MOUTH DAILY   predniSONE (DELTASONE) 5 MG tablet Take 4 tablets (20 mg total) by mouth daily with breakfast for 7 days, THEN 3 tablets (15 mg total) daily with breakfast for 7 days, THEN 2 tablets (10 mg total) daily with breakfast for 7 days, THEN 1 tablet (5 mg total) daily with breakfast for 7 days.   promethazine-dextromethorphan (PROMETHAZINE-DM) 6.25-15 MG/5ML syrup Take 5 mLs by mouth 4 (four) times daily as needed.   rosuvastatin (CRESTOR) 10 MG tablet Take 10 mg by mouth at bedtime.   XARELTO 20 MG TABS tablet TAKE 1 TABLET(20 MG) BY MOUTH DAILY WITH SUPPER   No facility-administered encounter medications on file as of 12/19/2022.    ALLERGIES:  No Known Allergies  LABORATORY DATA:  I have reviewed the labs as listed.  CBC    Component Value Date/Time   WBC 12.7 (H) 12/19/2022 0915   RBC 4.45 12/19/2022 0915   HGB 12.2 12/19/2022 0915   HGB 12.9 10/18/2022 0945   HCT 39.5 12/19/2022 0915   HCT 40.6 10/18/2022 0945   PLT 313 12/19/2022 0915   PLT 280 10/18/2022 0945   MCV 88.8 12/19/2022 0915   MCV 85 10/18/2022 0945   MCH 27.4 12/19/2022 0915   MCHC 30.9 12/19/2022 0915   RDW 14.5 12/19/2022 0915   RDW 14.6 10/18/2022 0945   LYMPHSABS 2.3 12/05/2022 2159   LYMPHSABS 3.0 09/02/2022 0855   MONOABS 0.4 12/05/2022 2159   EOSABS 0.0 12/05/2022 2159   EOSABS 0.0 09/02/2022 0855   BASOSABS 0.0 12/05/2022 2159   BASOSABS 0.0 09/02/2022 0855      Latest Ref Rng & Units 12/05/2022    9:59 PM 10/18/2022    9:47 AM 10/04/2022    8:05 PM  CMP  Glucose 70 - 99 mg/dL 562  72  95   BUN 6 - 20 mg/dL 17  16  14    Creatinine 0.44 - 1.00 mg/dL 1.30  8.65  7.84   Sodium 135 - 145 mmol/L 134  144  139   Potassium 3.5 - 5.1 mmol/L 4.1  4.0  3.4   Chloride 98 - 111 mmol/L 100  106  104   CO2 22 - 32 mmol/L 22  24  25    Calcium 8.9 - 10.3 mg/dL 69.6  9.1  9.1   Total Protein  6.5 - 8.1 g/dL 7.5   7.0   Total Bilirubin 0.3 - 1.2 mg/dL 0.7   1.0   Alkaline Phos 38 - 126 U/L 89   73   AST 15 - 41 U/L 32   19   ALT 0 - 44 U/L 27   22     DIAGNOSTIC IMAGING:  I have independently reviewed the relevant imaging and discussed with the patient.   WRAP UP:  All questions were answered. The patient knows to call the clinic with any problems, questions or concerns.  Medical decision making: Moderate  Time spent on visit: I spent 20 minutes counseling the patient face to face. The total time spent in the appointment was 30 minutes and more than 50% was on counseling.  Carnella Guadalajara, PA-C  12/19/22 10:39 AM

## 2022-12-17 IMAGING — US US EXTREM LOW VENOUS*L*
1 series · 13 of 24 positions shown · non-contrast
Comparison: X-ray left knee 10/27/2020

CLINICAL DATA: Edema.

EXAM:
Left LOWER EXTREMITY VENOUS DOPPLER ULTRASOUND
TECHNIQUE: Gray-scale sonography with compression, as well as color and duplex
ultrasound, were performed to evaluate the deep venous system(s)
from the level of the common femoral vein through the popliteal and
proximal calf veins.

[Series 1: us venous img lower uni left (dvt) · portal-venous · 39 acquisitions, 13 frames shown]
[im 1/39]
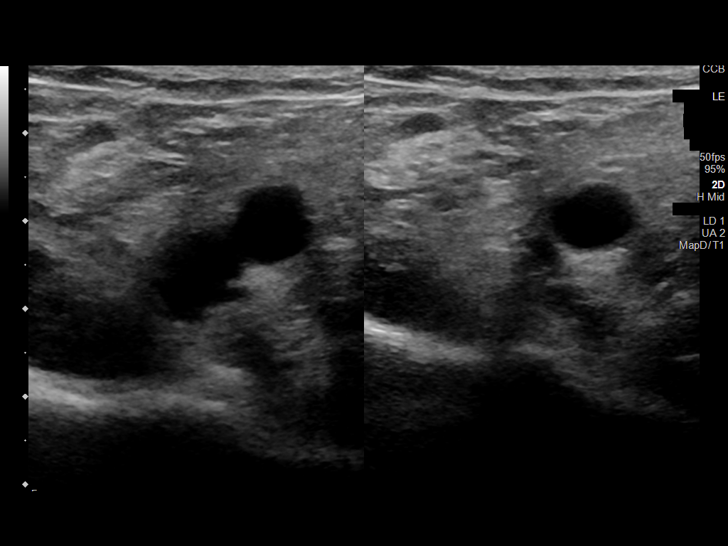
[im 4/39]
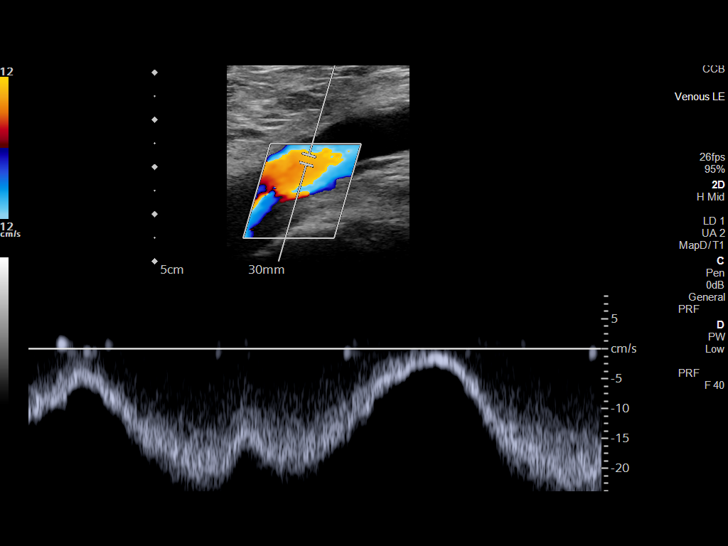
[im 7/39]
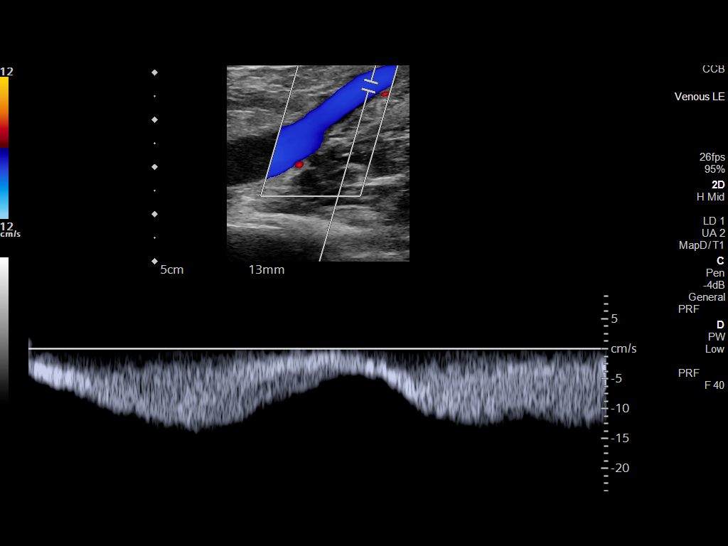
[im 10/39]
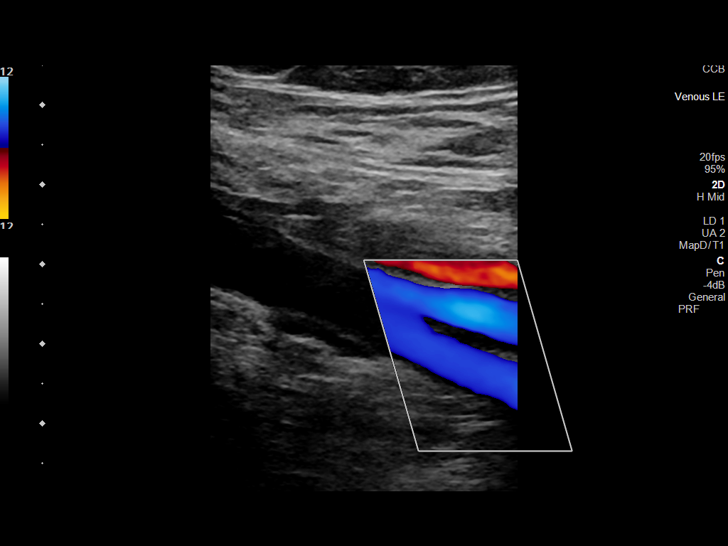
[im 14/39]
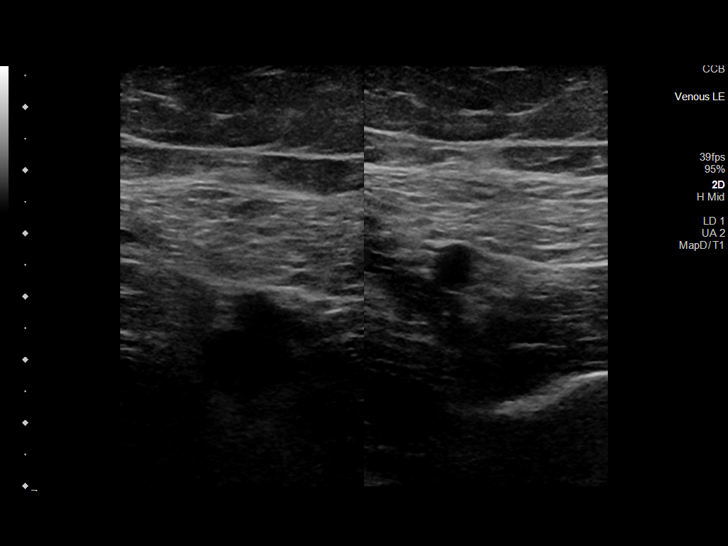
[im 17/39]
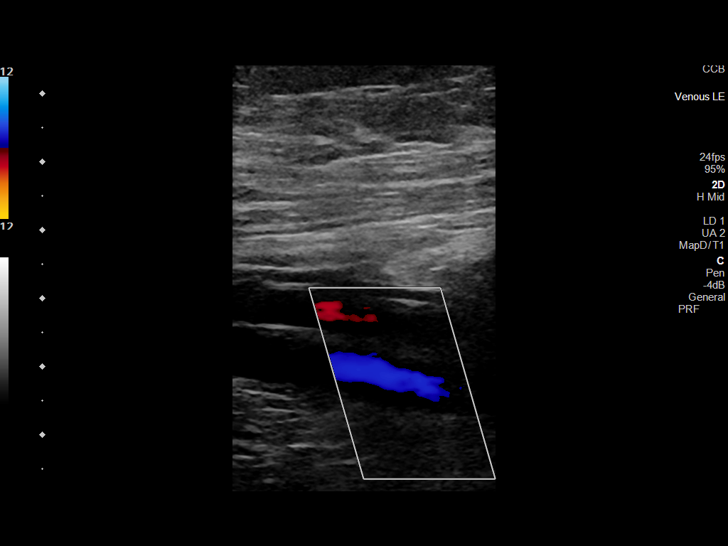
[im 22/39]
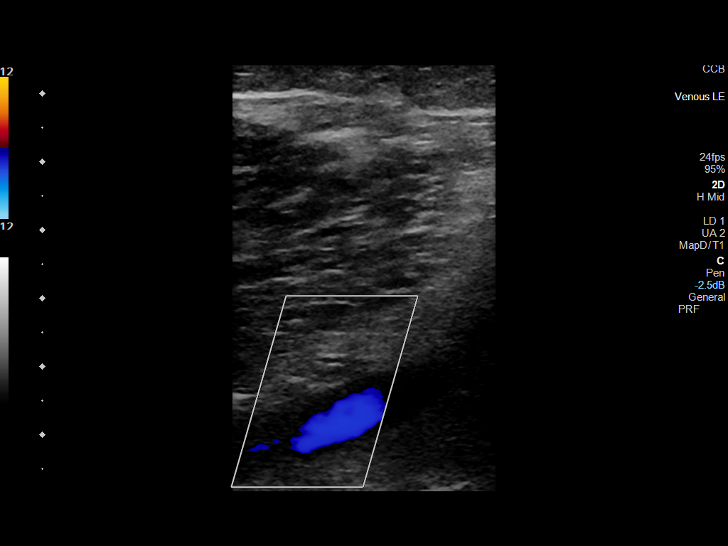
[im 24/39]
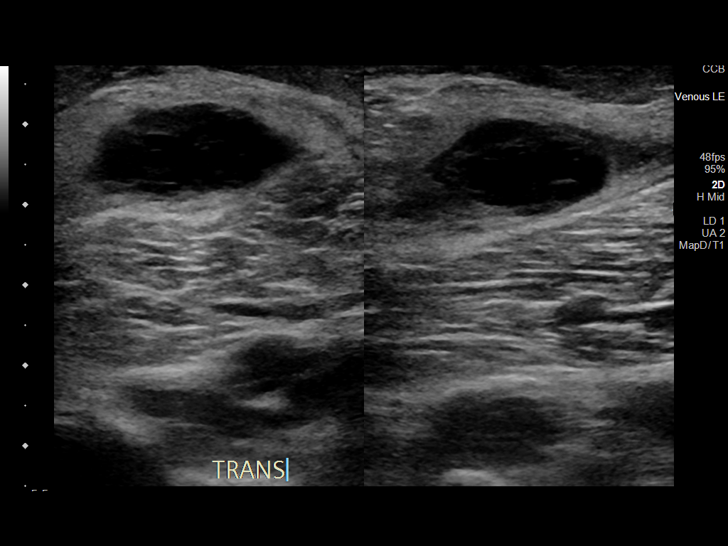
[im 27/39]
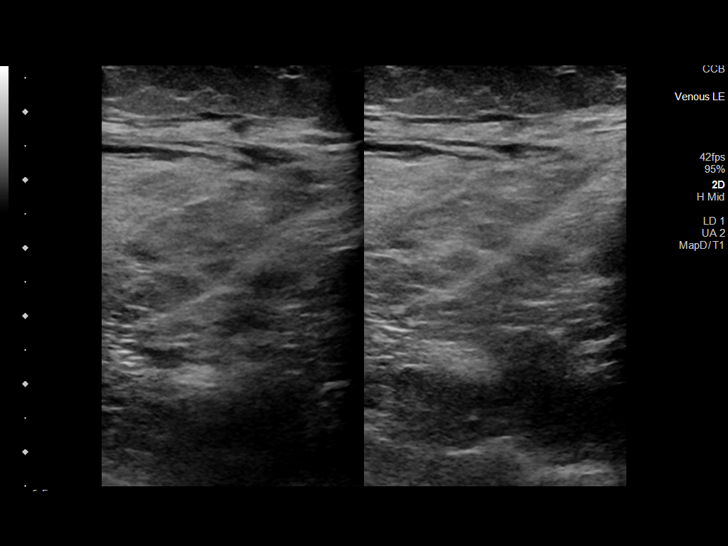
[im 30/39]
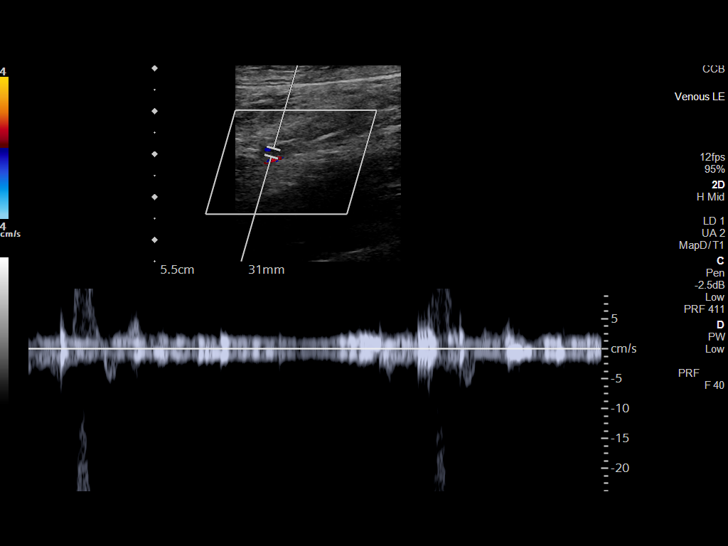
[im 34/39]
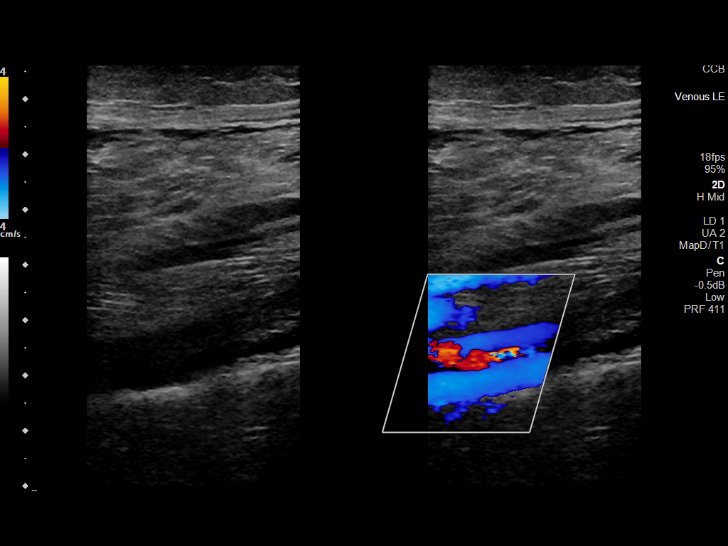
[im 35/39]
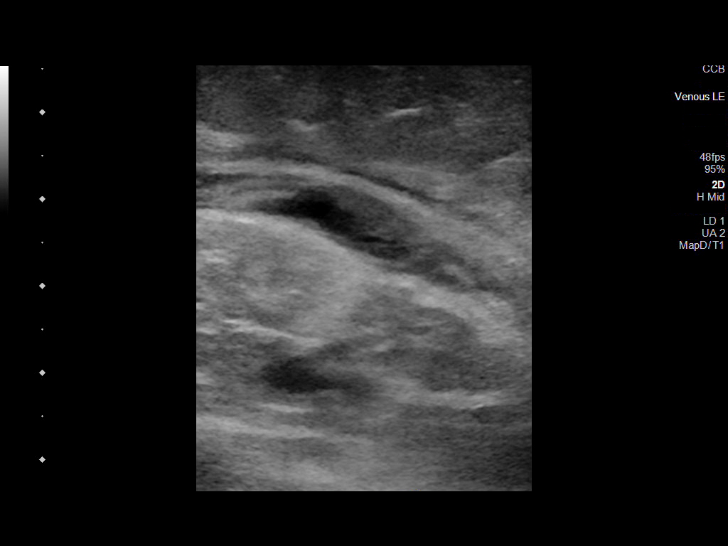
[im 39/39]
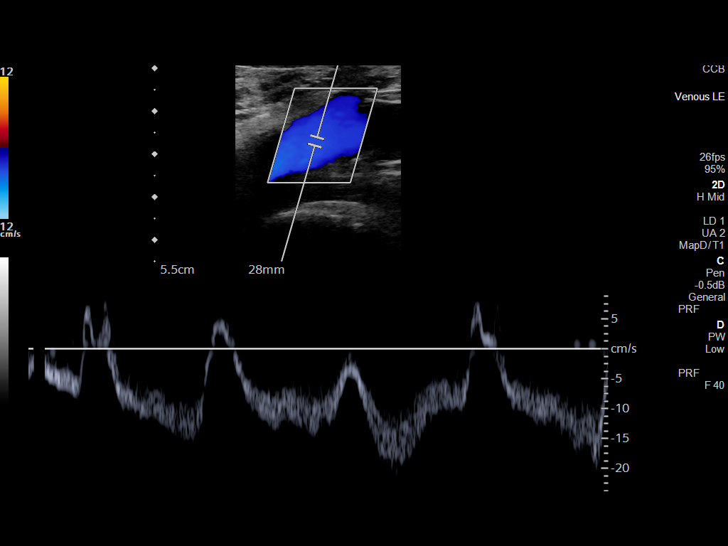

[13 of 24 positions shown; findings below may reference images not displayed]

FINDINGS: VENOUS

Normal compressibility of the common femoral, superficial femoral,
and popliteal veins, as well as the visualized calf veins.
Visualized portions of profunda femoral vein and great saphenous
vein unremarkable. No filling defects to suggest DVT on grayscale or
color Doppler imaging. Doppler waveforms show normal direction of
venous flow, normal respiratory plasticity and response to
augmentation.

Limited views of the contralateral common femoral vein are
unremarkable.

OTHER

There is a 2.9 x 2.3 x 1.3 cm fluid density lesion along the medial
left popliteal fossa.

Limitations: none
IMPRESSION: 1. No left lower extremity deep venous thrombosis.
2. A 2.9 x 2.3 x 1.3 cm fluid density lesion within the medial
popliteal fossa may represent a Baker's cyst versus liquified
hematoma versus large meniscal cyst.

## 2022-12-19 ENCOUNTER — Inpatient Hospital Stay (HOSPITAL_BASED_OUTPATIENT_CLINIC_OR_DEPARTMENT_OTHER): Payer: BC Managed Care – PPO | Admitting: Physician Assistant

## 2022-12-19 ENCOUNTER — Inpatient Hospital Stay: Payer: BC Managed Care – PPO | Attending: Hematology

## 2022-12-19 VITALS — BP 111/89 | HR 60 | Temp 98.7°F | Resp 18 | Wt 218.0 lb

## 2022-12-19 DIAGNOSIS — Z7952 Long term (current) use of systemic steroids: Secondary | ICD-10-CM | POA: Diagnosis not present

## 2022-12-19 DIAGNOSIS — Z7901 Long term (current) use of anticoagulants: Secondary | ICD-10-CM | POA: Diagnosis not present

## 2022-12-19 DIAGNOSIS — N189 Chronic kidney disease, unspecified: Secondary | ICD-10-CM | POA: Diagnosis not present

## 2022-12-19 DIAGNOSIS — I2699 Other pulmonary embolism without acute cor pulmonale: Secondary | ICD-10-CM

## 2022-12-19 DIAGNOSIS — Z87891 Personal history of nicotine dependence: Secondary | ICD-10-CM | POA: Insufficient documentation

## 2022-12-19 DIAGNOSIS — Z86711 Personal history of pulmonary embolism: Secondary | ICD-10-CM | POA: Insufficient documentation

## 2022-12-19 DIAGNOSIS — D869 Sarcoidosis, unspecified: Secondary | ICD-10-CM | POA: Diagnosis not present

## 2022-12-19 LAB — CBC
HCT: 39.5 % (ref 36.0–46.0)
Hemoglobin: 12.2 g/dL (ref 12.0–15.0)
MCH: 27.4 pg (ref 26.0–34.0)
MCHC: 30.9 g/dL (ref 30.0–36.0)
MCV: 88.8 fL (ref 80.0–100.0)
Platelets: 313 10*3/uL (ref 150–400)
RBC: 4.45 MIL/uL (ref 3.87–5.11)
RDW: 14.5 % (ref 11.5–15.5)
WBC: 12.7 10*3/uL — ABNORMAL HIGH (ref 4.0–10.5)
nRBC: 0 % (ref 0.0–0.2)

## 2022-12-19 LAB — D-DIMER, QUANTITATIVE: D-Dimer, Quant: <0.27 ug{FEU}/mL (ref 0.00–0.50)

## 2022-12-19 NOTE — Patient Instructions (Signed)
[facility] at [facility] **VISIT SUMMARY & IMPORTANT INSTRUCTIONS **   You were seen today by Rojelio Brenner PA-C for your history of blood clots.   Since you have been doing very well on the Xarelto since you were diagnosed with blood clots in 2021, we will tentatively discharge you from our clinic.  Please adhere to the following recommendations after being discharged from our clinic: Continue taking Xarelto indefinitely. See your primary care doctor at least once a year.  Your primary care doctor can refill your Xarelto/blood thinner prescriptions. Make an appointment to see [place] if any of the following occur:  You are considering stopping your blood thinner for any reason. You are having issues with bleeding while taking blood thinner. You have another blood clot despite being on blood thinner.  ** Thank you for trusting me with your healthcare!  I strive to provide all of my patients with quality care at each visit.  If you receive a survey for this visit, I would be so grateful to you for taking the time to provide feedback.  Thank you in advance!  ~ [name] Monforte                   Dr. Doreatha Massed   &   Rojelio Brenner, PA-C   - - - - - - - - - - - - - - - - - -    Thank you for choosing [facility] at [facility] to provide your oncology and hematology care.  To afford each patient quality time with our provider, please arrive at least 15 minutes before your scheduled appointment time.   If you have a lab appointment with the Cancer Center please come in thru the Main Entrance and check in at the main information desk.  You need to re-schedule your appointment should you arrive 10 or more minutes late.  We strive to give you quality time with our providers, and arriving late affects you and other patients whose appointments are after yours.  Also, if you no show three or more times for appointments you may be dismissed from the  clinic at the providers discretion.     Again, thank you for choosing [facility].  Our hope is that these requests will decrease the amount of time that you wait before being seen by our physicians.       _____________________________________________________________  Should you have questions after your visit to [facility], please contact our office at [phone] and follow the prompts.  Our office hours are 8:00 a.m. and 4:30 p.m. Monday - Friday.  Please note that voicemails left after 4:00 p.m. may not be returned until the following business day.  We are closed weekends and major holidays.  You do have access to a nurse 24-7, just call the main number to the clinic [phone] and do not press any options, hold on the line and a nurse will answer the phone.    For prescription refill requests, have your pharmacy contact our office and allow 72 hours.

## 2022-12-20 DIAGNOSIS — G4733 Obstructive sleep apnea (adult) (pediatric): Secondary | ICD-10-CM | POA: Diagnosis not present

## 2022-12-22 ENCOUNTER — Ambulatory Visit: Payer: BC Managed Care – PPO | Admitting: Nurse Practitioner

## 2022-12-22 NOTE — Progress Notes (Deleted)
Office Visit    Patient Name: [name] Date of Encounter: 12/22/2022 PCP:  Gilmore Laroche, FNP [facility]  Cardiologist:  Dina Rich, MD  Advanced Practice Provider:  No care team member to display Electrophysiologist:  None    Chief Complaint and HPI    Hailey Ross is a very pleasant 52 y.o. female with a hx of palpitations, sarcoidosis/bronchiectasis, CKD stage 3a (sees Dr. Wolfgang Phoenix), thoracic ascending aortic aneurysm (noted on CT scan 09/2022), chronic hx of lightheadedness, pulmonary HTN, OSA on CPAP, history of unprovoked PE, and hyperlipidemia, who presents today for chest pain/dizziness evaluation.    30-day event monitor in 2021 revealed no significant arrhythmias, was started on diltiazem 30 mg twice daily for symptoms of palpitations.  History of bilateral PEs found in 2021, repeat CT PE 1 month later showed resolution of PEs.  Repeat monitor in 2022 revealed rare PACs, rare episodes of SVT up to 12 beats, rare PVCs.  History of lightheadedness, seen by neurology.  EEG normal, MRI and carotid Doppler benign.  Closely followed by pulmonary for history of sarcoidosis/bronchiectasis.   Last seen by Dr. Dina Rich on July 06, 2021.  Was instructed that she could take an additional 30 mg of diltiazem as needed for palpitations.  Underwent orthostatics for lightheadedness, negative.  Recommended to follow-up with neurology and stated if no clear etiology found, to consider trial of midodrine.  I last saw her scheduled follow-up on October 03, 2022. Had a recent chest CT scan performed, was told to follow-up with Cardiology. Continued to admit to longstanding hx of lightheadedness since 2021. Described as intermittent feeling of lightheadedness and noted fatigue. Admitted to intermittent chest pain (4/10) for a while, described as tightness along left upper anterior chest wall, lasting a few seconds in duration, and not bothersome per her  report, occasional left upper extremity numbness, noted sleeping on left side. Admitted to being noncompliant with CPAP but was planning on resuming this.   She contacted our office last week noting chest pain. She presents to our office today for evaluation. She states this started 2 weeks ago, better recently than last week. Describes CP as an intermittent, dull burning sensation along left breast that she describes as subtle. She also describes a sensation along her left shoulder and neck. Saw her Neurologist 2 weeks ago for her longstanding history of lightheadedness who told her it is coming from her heart. Says she is feeling frustrated as she is not sure what is causing her symptoms. She has intermittent lightheadedness mainly noticed with extended periods of standing/walking. BP has been labile recently. Denies any worsening shortness of breath, recent palpitations, syncope, presyncope, dizziness, orthopnea, PND, swelling or significant weight changes, acute bleeding, or claudication.  SH: Husband is also a patient of mine.   EKGs/Labs/Other Studies Reviewed:   The following studies were reviewed today:   EKG:  EKG is not ordered today.    CT chest 09/21/2022:  IMPRESSION: 1. Chronic imaging stigmata compatible with reported clinical history of sarcoidosis, with mild progression compared to prior study from 08/13/2021, as above. 2. Dilatation of the pulmonic trunk (4.2 cm in diameter), concerning for pulmonary arterial hypertension. 3. Aortic atherosclerosis with mild aneurysmal dilatation of the ascending thoracic aorta (4.5 cm in diameter).   Aortic Atherosclerosis (ICD10-I70.0). Aortic aneurysm NOS (ICD10-I71.9).  Myoview 03/2021:    Lexiscan stress is electrically negative for ischemia   Myoview scan with probable normal perfusion and mild soft tissue attenuaton (  breast)   No significant ischemia or scar   LVEF is 67% with normal wall motion.   Low risk study   Monitor  12/2020:  14 day monitor Rare supraventricular ectopy in the form of isolated PACs, coupletes, triplets. Rare episodes of SVT up to 12 beats. Rare ventricular ectopy in the form of isolated PVCs, couplets Reported symptoms correlate with sinus rhythm     Patch Wear Time:  13 days and 23 hours (2022-06-14T11:36:38-0400 to 2022-06-28T10:57:53-0400)   Patient had a min HR of 45 bpm, max HR of 151 bpm, and avg HR of 73 bpm. Predominant underlying rhythm was Sinus Rhythm. 11 Supraventricular Tachycardia runs occurred, the run with the fastest interval lasting 9 beats with a max rate of 148 bpm, the  longest lasting 12 beats with an avg rate of 122 bpm. Isolated SVEs were rare (<1.0%), SVE Couplets were rare (<1.0%), and SVE Triplets were rare (<1.0%). Isolated VEs were rare (<1.0%), VE Couplets were rare (<1.0%), and no VE Triplets were present.   Echo 10/2020:  1. Left ventricular ejection fraction, by estimation, is 55 to 60%. The  left ventricle has normal function. The left ventricle has no regional  wall motion abnormalities. Left ventricular diastolic parameters were  normal. Normal global longitudinal  strain of -20.3%.   2. Right ventricular systolic function is normal. The right ventricular  size is normal. There is normal pulmonary artery systolic pressure. The  estimated right ventricular systolic pressure is 35.5 mmHg.   3. The mitral valve is grossly normal. Trivial mitral valve  regurgitation.   4. The aortic valve is tricuspid. Aortic valve regurgitation is not  visualized.   5. The inferior vena cava is normal in size with greater than 50%  respiratory variability, suggesting right atrial pressure of 3 mmHg.   Comparison(s): Echocardiogram done 09/13/19 showed an EF of 55-60%.   Risk Assessment/Calculations:   The 10-year ASCVD risk score (Arnett DK, et al., 2019) is: 1.8%   Values used to calculate the score:     Age: 20 years     Sex: Female     Is Non-Hispanic African  American: Yes     Diabetic: No     Tobacco smoker: No     Systolic Blood Pressure: 111 mmHg     Is BP treated: Yes     HDL Cholesterol: 72 mg/dL     Total Cholesterol: 221 mg/dL     Review of Systems    All other systems reviewed and are otherwise negative except as noted above.  Physical Exam    VS:  LMP 04/01/2021 (Exact Date)  , BMI There is no height or weight on file to calculate BMI.  Wt Readings from Last 3 Encounters:  12/19/22 218 lb 0.6 oz (98.9 kg)  12/13/22 213 lb 11.2 oz (96.9 kg)  12/06/22 211 lb 3.2 oz (95.8 kg)     GEN: Well nourished, well developed, in no acute distress. HEENT: normal. Neck: Supple, no JVD, carotid bruits, or masses. Cardiac: S1/S2, RRR, no murmurs, rubs, or gallops. No clubbing, cyanosis, edema.  Radials/PT 2+ and equal bilaterally.  Respiratory:  Respirations regular and unlabored, clear to auscultation bilaterally. GI: Soft, nontender, nondistended. MS: No deformity or atrophy. Skin: Warm and dry, no rash. Neuro:  Strength and sensation are intact. Psych: Normal affect.  Assessment & Plan    Chest pain of uncertain etiology Some atypical features of her chest pain. Past NST in 2022 negative. No definite coronary artery calcifications  noted on recent chest CT. Past EKG reassuring from last visit. Denies any active CP. Discussed ischemic evaluation including risks and benefits of ETT, and she verbalized understanding and is agreeable to proceed. Will update Echocardiogram to evaluate structure and function of heart and to evaluate for any wall motion abnormalities.  No medication changes at this time. Heart healthy diet and regular cardiovascular exercise encouraged. ED precautions discussed.  Informed Consent   Shared Decision Making/Informed Consent The risks [chest pain, shortness of breath, cardiac arrhythmias, dizziness, blood pressure fluctuations, myocardial infarction, stroke/transient ischemic attack, and life-threatening  complications (estimated to be 1 in 10,000)], benefits (risk stratification, diagnosing coronary artery disease, treatment guidance) and alternatives of an exercise tolerance test were discussed in detail with Ms. Petitfrere and she agrees to proceed.  Hx of palpitations Denies any recent palpitations. Previously instructed patient to take Diltiazem 30 mg daily PRN for palpitations. Heart healthy diet and regular cardiovascular exercise encouraged.   Lightheadedness Longstanding history. Past orthostatics negative in office. Follow-up with Neurology as scheduled. Arranging ETT and Echo as mentioned above. If testing unremarkable, agree with Dr. Verna Czech recommendation to consider future trial of midodrine to see if this relieves symptoms.   Pulmonary HTN, Sarcoidosis/Bronchiectasis, hx of unprovoked PE, OSA Denies any worsening symptoms. Continue to follow-up with Dr. Craige Cotta. Continue Xarelto, denies any bleeding issues, on appropriate dosage. Encouraged compliance with CPAP machine.   HLD LDL 138 08/2022. Managed by PCP. Recommend increasing Crestor to 20 mg daily. Will route note to PCP. Heart healthy diet and regular cardiovascular exercise encouraged.   6. Thoracic ascending aortic aneurysm Noted on chest CT 09/2022. Recommend repeating CT imaging in 6 months for monitoring. Care precautions and ED precautions discussed.   7. CKD stage 3a Most recent labs show stable kidney function. Avoid nephrotoxic agents. Continue to follow-up with Nephrology and PCP.   Disposition: Follow up in 6 weeks with Dina Rich, MD or APP.  Signed, Sharlene Dory, NP 12/22/2022, 7:59 AM [facility]

## 2022-12-23 ENCOUNTER — Encounter: Payer: Self-pay | Admitting: Nurse Practitioner

## 2023-01-07 ENCOUNTER — Other Ambulatory Visit (HOSPITAL_BASED_OUTPATIENT_CLINIC_OR_DEPARTMENT_OTHER): Payer: Self-pay | Admitting: Pulmonary Disease

## 2023-01-09 DIAGNOSIS — N189 Chronic kidney disease, unspecified: Secondary | ICD-10-CM | POA: Diagnosis not present

## 2023-01-09 DIAGNOSIS — D869 Sarcoidosis, unspecified: Secondary | ICD-10-CM | POA: Diagnosis not present

## 2023-01-09 DIAGNOSIS — R809 Proteinuria, unspecified: Secondary | ICD-10-CM | POA: Diagnosis not present

## 2023-01-09 DIAGNOSIS — D631 Anemia in chronic kidney disease: Secondary | ICD-10-CM | POA: Diagnosis not present

## 2023-01-11 DIAGNOSIS — N1831 Chronic kidney disease, stage 3a: Secondary | ICD-10-CM | POA: Diagnosis not present

## 2023-01-11 DIAGNOSIS — I129 Hypertensive chronic kidney disease with stage 1 through stage 4 chronic kidney disease, or unspecified chronic kidney disease: Secondary | ICD-10-CM | POA: Diagnosis not present

## 2023-01-11 DIAGNOSIS — D869 Sarcoidosis, unspecified: Secondary | ICD-10-CM | POA: Diagnosis not present

## 2023-01-11 DIAGNOSIS — I7121 Aneurysm of the ascending aorta, without rupture: Secondary | ICD-10-CM | POA: Diagnosis not present

## 2023-01-12 ENCOUNTER — Other Ambulatory Visit: Payer: Self-pay | Admitting: Nurse Practitioner

## 2023-01-14 LAB — HM MAMMOGRAPHY

## 2023-01-17 ENCOUNTER — Ambulatory Visit (INDEPENDENT_AMBULATORY_CARE_PROVIDER_SITE_OTHER): Payer: BC Managed Care – PPO | Admitting: Family Medicine

## 2023-01-17 ENCOUNTER — Encounter: Payer: Self-pay | Admitting: Family Medicine

## 2023-01-17 VITALS — BP 152/101 | HR 67 | Ht 65.0 in | Wt 221.1 lb

## 2023-01-17 DIAGNOSIS — E559 Vitamin D deficiency, unspecified: Secondary | ICD-10-CM | POA: Diagnosis not present

## 2023-01-17 DIAGNOSIS — E7849 Other hyperlipidemia: Secondary | ICD-10-CM | POA: Diagnosis not present

## 2023-01-17 DIAGNOSIS — R7301 Impaired fasting glucose: Secondary | ICD-10-CM | POA: Diagnosis not present

## 2023-01-17 DIAGNOSIS — N3281 Overactive bladder: Secondary | ICD-10-CM | POA: Diagnosis not present

## 2023-01-17 DIAGNOSIS — I10 Essential (primary) hypertension: Secondary | ICD-10-CM

## 2023-01-17 DIAGNOSIS — E038 Other specified hypothyroidism: Secondary | ICD-10-CM

## 2023-01-17 DIAGNOSIS — G4733 Obstructive sleep apnea (adult) (pediatric): Secondary | ICD-10-CM | POA: Diagnosis not present

## 2023-01-17 MED ORDER — MIRABEGRON ER 25 MG PO TB24: 25.0000 mg | ORAL_TABLET | Freq: Every day | ORAL | 1 refills | Status: DC

## 2023-01-17 MED ORDER — OLMESARTAN MEDOXOMIL 20 MG PO TABS
20.0000 mg | ORAL_TABLET | Freq: Every day | ORAL | 1 refills | Status: DC
Start: 2023-01-17 — End: 2023-02-20

## 2023-01-17 NOTE — Progress Notes (Unsigned)
Established Patient Office Visit  Subjective:  Patient ID: Hailey Ross, female    DOB: 1970/05/05  Age: 52 y.o. MRN: [id number]  CC:  Chief Complaint  Patient presents with   Care Management    3 month f/u   Back Pain    Pt reports back pain ongoing for about 1 week and a half. Unsure where this came from.    HPI Hailey Ross is a 52 y.o. female with past medical history of hypertension, hyperlipidemia presents for f/u of  chronic medical conditions. For the details of today's visit, please refer to the assessment and plan.  Lower back pain: She reports that her symptoms have resolved.  No complaints or concerns reported today.     Past Medical History:  Diagnosis Date   Acute cholecystitis 08/24/2016   Acute kidney injury superimposed on chronic kidney disease (HCC) 06/20/2019   Acute respiratory failure with hypoxia (HCC) 06/19/2019   Annual visit for general adult medical examination with abnormal findings 04/16/2019   Arthritis    right knee   Bronchiectasis (HCC)    CKD (chronic kidney disease)    Iron deficiency    Lightheadedness 05/10/2019   PE (pulmonary thromboembolism) (HCC) 06/2019   Pre-syncope 04/16/2019   Sarcoidosis    Sarcoidosis    Sleep apnea    c pap   Smoker 02/12/2014    Past Surgical History:  Procedure Laterality Date   CHOLECYSTECTOMY N/A 08/25/2016   Procedure: LAPAROSCOPIC CHOLECYSTECTOMY;  Surgeon: Abigail Miyamoto, MD;  Location: [facility];  Service: General;  Laterality: N/A;   TUBAL LIGATION  02/1993    Family History  Problem Relation Age of Onset   Hypertension Mother    Cancer Mother    Colon polyps Mother    Healthy Sister    Healthy Sister    Healthy Sister    Healthy Sister    Healthy Son    Healthy Son    Healthy Daughter    Colon cancer Neg Hx    Esophageal cancer Neg Hx    Rectal cancer Neg Hx    Stomach cancer Neg Hx     Social History   Socioeconomic History   Marital status: Married    Spouse name: Not on  file   Number of children: 3   Years of education: Not on file   Highest education level: High school graduate  Occupational History   Occupation: Administrator, [employer] ([employer])  Tobacco Use   Smoking status: Former    Current packs/day: 0.00    Average packs/day: 1 pack/day for 20.0 years (20.0 ttl pk-yrs)    Types: Cigarettes    Start date: 03/21/1995    Quit date: 03/21/2015    Years since quitting: 7.8    Passive exposure: Past   Smokeless tobacco: Never  Vaping Use   Vaping status: Never Used  Substance and Sexual Activity   Alcohol use: Not Currently    Comment: Rare   Drug use: Never   Sexual activity: Yes    Birth control/protection: Surgical  Other Topics Concern   Not on file  Social History Narrative   Live with partners [name]-19 years   3 children.    1-[name] (girl) 27 at home with her: 2 grandchildren in her home as well    [name]- 26 expecting   [name]-25      Enjoy: read, play games on tablet, grandkids      Diet: Veggies, does not eat a lot of fried  foods, enjoys chicken and fruit   Caffeine: sodas and coffee (with sugar)   Water: Does not drink a lot at all      Wears seat beat   Does not wear sunscreen   Smoke and carbon monoxide detectors   Does not use phone while driving    Social Determinants of Health   Financial Resource Strain: Low Risk  (10/10/2018)   Overall Financial Resource Strain (CARDIA)    Difficulty of Paying Living Expenses: Not hard at all  Food Insecurity: No Food Insecurity (12/06/2022)   Hunger Vital Sign    Worried About Running Out of Food in the Last Year: Never true    Ran Out of Food in the Last Year: Never true  Transportation Needs: No Transportation Needs (12/06/2022)   PRAPARE - Administrator, Civil Service (Medical): No    Lack of Transportation (Non-Medical): No  Physical Activity: Sufficiently Active (10/10/2018)   Exercise Vital Sign    Days of Exercise per Week: 7 days    Minutes of Exercise per Session:  60 min  Stress: No Stress Concern Present (10/10/2018)   [employer] of Occupational Health - Occupational Stress Questionnaire    Feeling of Stress : Not at all  Social Connections: Unknown (08/24/2021)   Received from [facility]   Social Network    Social Network: Not on file  Intimate Partner Violence: Not At Risk (12/06/2022)   Humiliation, Afraid, Rape, and Kick questionnaire    Fear of Current or Ex-Partner: No    Emotionally Abused: No    Physically Abused: No    Sexually Abused: No    Outpatient Medications Prior to Visit  Medication Sig Dispense Refill   acetaminophen (TYLENOL) 500 MG tablet Take 500 mg by mouth every 4 (four) hours as needed for mild pain, moderate pain or headache.      albuterol (VENTOLIN HFA) 108 (90 Base) MCG/ACT inhaler Inhale 2 puffs into the lungs every 6 (six) hours as needed for wheezing or shortness of breath. 8 g 0   azelastine (ASTELIN) 0.1 % nasal spray Place 1 spray into both nostrils daily at 2 am. Use in each nostril as directed 30 mL 12   Budeson-Glycopyrrol-Formoterol (BREZTRI AEROSPHERE) 160-9-4.8 MCG/ACT AERO Inhale 2 puffs into the lungs in the morning and at bedtime. 10.7 g 3   diltiazem (CARDIZEM) 30 MG tablet Take 1 tablet (30 mg total) by mouth daily as needed (For palpitations). May take an additional 30 mg tablet daily as needed for palpitations 135 tablet 1   LORazepam (ATIVAN) 0.5 MG tablet Take 0.5 mg by mouth daily as needed for anxiety.     methotrexate (RHEUMATREX) 2.5 MG tablet Take 4 tablets (10 mg total) by mouth once a week. 16 tablet 0   metoCLOPramide (REGLAN) 5 MG tablet Take 1 tablet (5 mg total) by mouth 2 (two) times daily. 60 tablet 5   pantoprazole (PROTONIX) 40 MG tablet TAKE 1 TABLET(40 MG) BY MOUTH DAILY 90 tablet 3   rosuvastatin (CRESTOR) 10 MG tablet Take 10 mg by mouth at bedtime.     XARELTO 20 MG TABS tablet TAKE 1 TABLET(20 MG) BY MOUTH DAILY WITH SUPPER 90 tablet 3    amoxicillin-clavulanate (AUGMENTIN) 875-125 MG tablet Take 1 tablet by mouth 2 (two) times daily. 20 tablet 0   olmesartan (BENICAR) 5 MG tablet Take 5 mg by mouth daily.     promethazine-dextromethorphan (PROMETHAZINE-DM) 6.25-15 MG/5ML syrup Take 5 mLs by  mouth 4 (four) times daily as needed. 118 mL 0   No facility-administered medications prior to visit.    No Known Allergies  ROS Review of Systems  Constitutional:  Negative for chills and fever.  Eyes:  Negative for visual disturbance.  Respiratory:  Negative for chest tightness and shortness of breath.   Neurological:  Negative for dizziness and headaches.      Objective:    Physical Exam HENT:     Head: Normocephalic.     Mouth/Throat:     Mouth: Mucous membranes are moist.  Cardiovascular:     Rate and Rhythm: Normal rate.     Heart sounds: Normal heart sounds.  Pulmonary:     Effort: Pulmonary effort is normal.     Breath sounds: Normal breath sounds.  Neurological:     Mental Status: She is alert.     BP (!) 152/101 (BP Location: Left Arm)   Pulse 67   Ht 5\' 5"  (1.651 m)   Wt 221 lb 1.9 oz (100.3 kg)   LMP 04/01/2021 (Exact Date)   SpO2 92%   BMI 36.80 kg/m  Wt Readings from Last 3 Encounters:  01/17/23 221 lb 1.9 oz (100.3 kg)  12/19/22 218 lb 0.6 oz (98.9 kg)  12/13/22 213 lb 11.2 oz (96.9 kg)    Lab Results  Component Value Date   TSH 0.494 01/17/2023   Lab Results  Component Value Date   WBC 12.0 (H) 01/17/2023   HGB 13.0 01/17/2023   HCT 41.4 01/17/2023   MCV 87 01/17/2023   PLT 374 01/17/2023   Lab Results  Component Value Date   NA 146 (H) 01/17/2023   K 4.6 01/17/2023   CO2 21 01/17/2023   GLUCOSE 102 (H) 01/17/2023   BUN 17 01/17/2023   CREATININE 1.07 (H) 01/17/2023   BILITOT 0.4 01/17/2023   ALKPHOS 95 01/17/2023   AST 21 01/17/2023   ALT 18 01/17/2023   PROT 6.7 01/17/2023   ALBUMIN 4.0 01/17/2023   CALCIUM 9.2 01/17/2023   ANIONGAP 12 12/05/2022   EGFR 62 01/17/2023    GFR 53.13 (L) 04/20/2021   Lab Results  Component Value Date   CHOL 199 01/17/2023   Lab Results  Component Value Date   HDL 93 01/17/2023   Lab Results  Component Value Date   LDLCALC 96 01/17/2023   Lab Results  Component Value Date   TRIG 56 01/17/2023   Lab Results  Component Value Date   CHOLHDL 2.1 01/17/2023   Lab Results  Component Value Date   HGBA1C 6.1 (H) 01/17/2023      Assessment & Plan:  Essential hypertension Assessment & Plan: Uncontrolled Hypertension Management  Your current blood pressure is above the target goal of <140/90 mmHg. To address this, please start taking Olmesartan 20 mg daily   Medication Instructions: Take your blood pressure medication at the same time each day. After taking your medication, check your blood pressure at least an hour later. If your first reading is >140/90 mmHg, wait at least 10 minutes and recheck your blood pressure. Side Effects: In the initial days of therapy, you may experience dizziness or lightheadedness as your body adjusts to the lower blood pressure; this is expected. Diet and Lifestyle: Adhere to a low-sodium diet, limiting intake to less than 1500 mg daily, and increase your physical activity. Avoid over-the-counter NSAIDs such as ibuprofen and naproxen while on this medication. Hydration and Nutrition: Stay well-hydrated by drinking at least 64 ounces of  water daily. Increase your servings of fruits and vegetables and avoid excessive sodium in your diet. Long-Term Considerations: Uncontrolled hypertension can increase the risk of cardiovascular diseases, including stroke, coronary artery disease, and heart failure.  Please report to the emergency department if your blood pressure exceeds 180/120 and is accompanied by symptoms such as headaches, chest pain, palpitations, blurred vision, or dizziness.   Orders: -     Olmesartan Medoxomil; Take 1 tablet (20 mg total) by mouth daily.  Dispense: 30 tablet;  Refill: 1  Overactive bladder Assessment & Plan: The patient reports involuntary leakage of fluid, especially at nighttime. She states that she has performed Kegel exercises in the past for symptom relief. She denies fever or chills. Overactive bladder Please start taking Myrbetriq 25 mg daily  Nonpharmacological interventions for managing overactive bladder (OAB) can significantly improve symptoms and quality of life. Here are several effective strategies: -Pelvic Floor Exercises (Kegel Exercises) Strengthening the pelvic floor muscles can help improve bladder control. To perform Kegel exercises, contract the pelvic floor muscles (as if stopping the flow of urine), hold for a few seconds, then relax. Repeat several times a day. -Fluid Management Monitor Fluid Intake: While staying hydrated is essential, be mindful of the types and timing of fluid intake. Reduce consumption of diuretics like caffeine and alcohol, especially in the evening. Evening Fluid Restriction: Limit fluid intake a few hours before bedtime to minimize nighttime trips to the bathroom. -Dietary Modifications Avoid bladder irritants such as spicy foods, citrus fruits, artificial sweeteners, and carbonated beverages that may exacerbate symptoms.  Orders: -     Mirabegron ER; Take 1 tablet (25 mg total) by mouth daily.  Dispense: 30 tablet; Refill: 1  Other hyperlipidemia Assessment & Plan: She takes rosuvastatin 10 mg daily Reports treatment compliance Encouraged decreasing her intake of greasy, starchy, fatty foods with increased physical activity Lab Results  Component Value Date   CHOL 199 01/17/2023   HDL 93 01/17/2023   LDLCALC 96 01/17/2023   TRIG 56 01/17/2023   CHOLHDL 2.1 01/17/2023     Orders: -     Lipid panel -     CMP14+EGFR -     CBC with Differential/Platelet  IFG (impaired fasting glucose) -     Hemoglobin A1c  Vitamin D deficiency -     VITAMIN D 25 Hydroxy (Vit-D Deficiency,  Fractures)  Other specified hypothyroidism -     TSH + free T4  Note: This chart has been completed using Engineer, civil (consulting) software, and while attempts have been made to ensure accuracy, certain words and phrases may not be transcribed as intended.    Follow-up: Return in about 1 month (around 02/17/2023) for BP.   Gilmore Laroche, FNP

## 2023-01-17 NOTE — Patient Instructions (Addendum)
I appreciate the opportunity to provide care to you today!    Follow up:  1 months  Labs: please stop by the lab today to get your blood drawn (CBC, CMP, TSH, Lipid profile, HgA1c, Vit D)  Hypertension Management  Your current blood pressure is above the target goal of <140/90 mmHg. To address this, please start taking Olmesartan 20 mg daily   Medication Instructions: Take your blood pressure medication at the same time each day. After taking your medication, check your blood pressure at least an hour later. If your first reading is >140/90 mmHg, wait at least 10 minutes and recheck your blood pressure. Side Effects: In the initial days of therapy, you may experience dizziness or lightheadedness as your body adjusts to the lower blood pressure; this is expected. Diet and Lifestyle: Adhere to a low-sodium diet, limiting intake to less than 1500 mg daily, and increase your physical activity. Avoid over-the-counter NSAIDs such as ibuprofen and naproxen while on this medication. Hydration and Nutrition: Stay well-hydrated by drinking at least 64 ounces of water daily. Increase your servings of fruits and vegetables and avoid excessive sodium in your diet. Long-Term Considerations: Uncontrolled hypertension can increase the risk of cardiovascular diseases, including stroke, coronary artery disease, and heart failure.  Please report to the emergency department if your blood pressure exceeds 180/120 and is accompanied by symptoms such as headaches, chest pain, palpitations, blurred vision, or dizziness.   Overactive bladder Please start taking Myrbetriq 25 mg daily  Nonpharmacological interventions for managing overactive bladder (OAB) can significantly improve symptoms and quality of life. Here are several effective strategies: -Pelvic Floor Exercises (Kegel Exercises) Strengthening the pelvic floor muscles can help improve bladder control. To perform Kegel exercises, contract the pelvic floor  muscles (as if stopping the flow of urine), hold for a few seconds, then relax. Repeat several times a day. -Fluid Management Monitor Fluid Intake: While staying hydrated is essential, be mindful of the types and timing of fluid intake. Reduce consumption of diuretics like caffeine and alcohol, especially in the evening. Evening Fluid Restriction: Limit fluid intake a few hours before bedtime to minimize nighttime trips to the bathroom. -Dietary Modifications Avoid bladder irritants such as spicy foods, citrus fruits, artificial sweeteners, and carbonated beverages that may exacerbate symptoms.     Please stop by your local pharmacy and get your Tdap and Shingles vaccine  Referrals today-   Attached with your AVS, you will find valuable resources for self-education. I highly recommend dedicating some time to thoroughly examine them.   Please continue to a heart-healthy diet and increase your physical activities. Try to exercise for at least five days a week.    It was a pleasure to see you and I look forward to continuing to work together on your health and well-being. Please do not hesitate to call the office if you need care or have questions about your care.  In case of emergency, please visit the Emergency Department for urgent care, or contact our clinic at [phone] to schedule an appointment. We're here to help you!   Have a wonderful day and week. With Gratitude, Gilmore Laroche MSN, FNP-BC

## 2023-01-18 DIAGNOSIS — N3281 Overactive bladder: Secondary | ICD-10-CM | POA: Insufficient documentation

## 2023-01-18 LAB — CBC WITH DIFFERENTIAL/PLATELET
Basophils Absolute: 0 10*3/uL (ref 0.0–0.2)
Basos: 0 %
EOS (ABSOLUTE): 0 10*3/uL (ref 0.0–0.4)
Eos: 0 %
Hematocrit: 41.4 % (ref 34.0–46.6)
Hemoglobin: 13 g/dL (ref 11.1–15.9)
Immature Grans (Abs): 0.1 10*3/uL (ref 0.0–0.1)
Immature Granulocytes: 1 %
Lymphocytes Absolute: 3.5 10*3/uL — ABNORMAL HIGH (ref 0.7–3.1)
Lymphs: 29 %
MCH: 27.3 pg (ref 26.6–33.0)
MCHC: 31.4 g/dL — ABNORMAL LOW (ref 31.5–35.7)
MCV: 87 fL (ref 79–97)
Monocytes Absolute: 0.5 10*3/uL (ref 0.1–0.9)
Monocytes: 4 %
Neutrophils Absolute: 7.9 10*3/uL — ABNORMAL HIGH (ref 1.4–7.0)
Neutrophils: 66 %
Platelets: 374 10*3/uL (ref 150–450)
RBC: 4.77 x10E6/uL (ref 3.77–5.28)
RDW: 12.8 % (ref 11.7–15.4)
WBC: 12 10*3/uL — ABNORMAL HIGH (ref 3.4–10.8)

## 2023-01-18 LAB — CMP14+EGFR
ALT: 18 [IU]/L (ref 0–32)
AST: 21 [IU]/L (ref 0–40)
Albumin: 4 g/dL (ref 3.8–4.9)
Alkaline Phosphatase: 95 [IU]/L (ref 44–121)
BUN/Creatinine Ratio: 16 (ref 9–23)
BUN: 17 mg/dL (ref 6–24)
Bilirubin Total: 0.4 mg/dL (ref 0.0–1.2)
CO2: 21 mmol/L (ref 20–29)
Calcium: 9.2 mg/dL (ref 8.7–10.2)
Chloride: 107 mmol/L — ABNORMAL HIGH (ref 96–106)
Creatinine, Ser: 1.07 mg/dL — ABNORMAL HIGH (ref 0.57–1.00)
Globulin, Total: 2.7 g/dL (ref 1.5–4.5)
Glucose: 102 mg/dL — ABNORMAL HIGH (ref 70–99)
Potassium: 4.6 mmol/L (ref 3.5–5.2)
Sodium: 146 mmol/L — ABNORMAL HIGH (ref 134–144)
Total Protein: 6.7 g/dL (ref 6.0–8.5)
eGFR: 62 mL/min/{1.73_m2} (ref >59)

## 2023-01-18 LAB — LIPID PANEL
Chol/HDL Ratio: 2.1 {ratio} (ref 0.0–4.4)
Cholesterol, Total: 199 mg/dL (ref 100–199)
HDL: 93 mg/dL (ref >39)
LDL Chol Calc (NIH): 96 mg/dL (ref 0–99)
Triglycerides: 56 mg/dL (ref 0–149)
VLDL Cholesterol Cal: 10 mg/dL (ref 5–40)

## 2023-01-18 LAB — HEMOGLOBIN A1C
Est. average glucose Bld gHb Est-mCnc: 128 mg/dL
Hgb A1c MFr Bld: 6.1 % — ABNORMAL HIGH (ref 4.8–5.6)

## 2023-01-18 LAB — TSH+FREE T4
Free T4: 0.98 ng/dL (ref 0.82–1.77)
TSH: 0.494 u[IU]/mL (ref 0.450–4.500)

## 2023-01-18 LAB — VITAMIN D 25 HYDROXY (VIT D DEFICIENCY, FRACTURES): Vit D, 25-Hydroxy: 28.3 ng/mL — ABNORMAL LOW (ref 30.0–100.0)

## 2023-01-18 NOTE — Assessment & Plan Note (Signed)
Uncontrolled Hypertension Management  Your current blood pressure is above the target goal of <140/90 mmHg. To address this, please start taking Olmesartan 20 mg daily   Medication Instructions: Take your blood pressure medication at the same time each day. After taking your medication, check your blood pressure at least an hour later. If your first reading is >140/90 mmHg, wait at least 10 minutes and recheck your blood pressure. Side Effects: In the initial days of therapy, you may experience dizziness or lightheadedness as your body adjusts to the lower blood pressure; this is expected. Diet and Lifestyle: Adhere to a low-sodium diet, limiting intake to less than 1500 mg daily, and increase your physical activity. Avoid over-the-counter NSAIDs such as ibuprofen and naproxen while on this medication. Hydration and Nutrition: Stay well-hydrated by drinking at least 64 ounces of water daily. Increase your servings of fruits and vegetables and avoid excessive sodium in your diet. Long-Term Considerations: Uncontrolled hypertension can increase the risk of cardiovascular diseases, including stroke, coronary artery disease, and heart failure.  Please report to the emergency department if your blood pressure exceeds 180/120 and is accompanied by symptoms such as headaches, chest pain, palpitations, blurred vision, or dizziness.

## 2023-01-18 NOTE — Progress Notes (Signed)
Office Visit Note  Patient: Hailey Ross             Date of Birth: 08-17-1970           MRN: [id number]             PCP: Gilmore Laroche, FNP Referring: Donell Beers, FNP Visit Date: 01/31/2023 Occupation: @GUAROCC @  Subjective:  Joint stiffness  History of Present Illness: Hailey Ross is a 52 y.o. female with sarcoidosis and osteoarthritis.  She was hospitalized in August 2024 with epistaxis.  At the time she was diagnosed with acute frontal sinus infection and was treated with antibiotics.  She was also placed on prednisone.  Patient was also evaluated by Creig Hines in July 2024.  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  At that visit she was placed on methotrexate which was gradually increased.  Patient states after taking 1 dose of methotrexate she stopped it as she thought she was supposed to take only 1 dose.  She was seen by Dr. Craige Cotta on December 13, 2022 who advised her to resume methotrexate 2.5 mg, 4 tablets p.o. weekly on a regular basis along with folic acid 1 mg p.o. daily.  He denies any side effects from methotrexate.  She has been taking methotrexate on a regular basis since then.  She also has history of bronchiectasis which has been doing better on Breztri.  She denies any joint pain or discomfort.  She remains on Xarelto for pulmonary embolism.  Left knee joint injection in May 2024.  She had no recurrence of pain in her left knee since then.  She denies any discomfort in her hands today.    Activities of Daily Living:  Patient reports morning stiffness for 10-15 minutes.   Patient Denies nocturnal pain.  Difficulty dressing/grooming: Denies Difficulty climbing stairs: Reports Difficulty getting out of chair: Denies Difficulty using hands for taps, buttons, cutlery, and/or writing: Denies  Review of Systems  Constitutional:  Negative for fatigue.  HENT:  Positive for mouth dryness. Negative for mouth  sores.   Eyes:  Negative for dryness.  Respiratory:  Positive for cough, shortness of breath and wheezing.   Cardiovascular:  Negative for chest pain and palpitations.  Gastrointestinal:  Negative for blood in stool, constipation and diarrhea.  Endocrine: Negative for increased urination.  Genitourinary:  Negative for involuntary urination.  Musculoskeletal:  Positive for joint pain, joint pain, myalgias, morning stiffness, muscle tenderness and myalgias. Negative for gait problem, joint swelling and muscle weakness.  Skin:  Negative for color change, rash, hair loss and sensitivity to sunlight.  Allergic/Immunologic: Negative for susceptible to infections.  Neurological:  Positive for dizziness and headaches.  Hematological:  Negative for swollen glands.  Psychiatric/Behavioral:  Negative for depressed mood and sleep disturbance. The patient is not nervous/anxious.     PMFS History:  Patient Active Problem List   Diagnosis Date Noted   Overactive bladder 01/18/2023   Chronic anticoagulation 12/06/2022   Epistaxis 12/06/2022   Acute frontal sinusitis 12/06/2022   Acute respiratory failure with hypoxia (HCC) 12/05/2022   Gastroparesis 11/29/2022   Colitis 10/18/2022   Other constipation 10/04/2022   Upper abdominal pain 04/29/2022   Early satiety 04/29/2022   Acute bronchitis 03/21/2022   Immunization due 12/17/2021   Need for immunization against influenza 12/17/2021   Fatigue 12/17/2021   Encounter for annual general medical examination with abnormal findings in adult 08/30/2021   CAP (community acquired pneumonia) 06/16/2021  Acute midline low back pain without sciatica 04/22/2021   Left lower quadrant abdominal pain 04/20/2021   Atypical chest pain 01/26/2021   Synovitis of left knee 01/19/2021   GERD (gastroesophageal reflux disease) 12/29/2020   Abdominal pain, epigastric 12/28/2020   Primary osteoarthritis of left knee 10/27/2020   Grief at loss of child 06/09/2020    Insomnia 06/09/2020   Right ankle swelling 02/05/2020   Encounter for examination following treatment at hospital 01/28/2020   Palpitations 01/28/2020   Menorrhagia with regular cycle 10/15/2019   OSA (obstructive sleep apnea) 10/08/2019   Right knee pain 08/29/2019   Positive ANA (antinuclear antibody) 08/29/2019   Essential hypertension 07/04/2019   Pain and swelling of right knee 07/04/2019   Hyperlipidemia 07/04/2019   Stage 2 chronic kidney disease 07/04/2019   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   Encounter for support and coordination of transition of care 07/04/2019   Daytime hypersomnolence 07/02/2019   Pulmonary hypertension (HCC) 07/02/2019   CKD (chronic kidney disease)    Bilateral pulmonary embolism (HCC) 06/18/2019   Lightheadedness 05/10/2019   Vitamin D deficiency 05/10/2019   Obesity (BMI 30-39.9) 04/16/2019   Sarcoidosis 05/04/2015   Bronchiectasis (HCC) 05/04/2015    Past Medical History:  Diagnosis Date   Acute cholecystitis 08/24/2016   Acute kidney injury superimposed on chronic kidney disease (HCC) 06/20/2019   Acute respiratory failure with hypoxia (HCC) 06/19/2019   Annual visit for general adult medical examination with abnormal findings 04/16/2019   Arthritis    right knee   Bronchiectasis (HCC)    CKD (chronic kidney disease)    Iron deficiency    Lightheadedness 05/10/2019   PE (pulmonary thromboembolism) (HCC) 06/2019   Pre-syncope 04/16/2019   Sarcoidosis    Sarcoidosis    Sleep apnea    c pap   Smoker 02/12/2014    Family History  Problem Relation Age of Onset   Hypertension Mother    Cancer Mother    Colon polyps Mother    Healthy Sister    Healthy Sister    Healthy Sister    Healthy Sister    Healthy Son    Healthy Son    Healthy Daughter    Colon cancer Neg Hx    Esophageal cancer Neg Hx    Rectal cancer Neg Hx    Stomach cancer Neg Hx    Past Surgical History:  Procedure Laterality Date   CHOLECYSTECTOMY N/A 08/25/2016    Procedure: LAPAROSCOPIC CHOLECYSTECTOMY;  Surgeon: Abigail Miyamoto, MD;  Location: [facility] OR;  Service: General;  Laterality: N/A;   TUBAL LIGATION  02/1993   Social History   Social History Narrative   Live with partners [name]-19 years   3 children.    1-[name] (girl) 27 at home with her: 2 grandchildren in her home as well    [name]- 26 expecting   [name]-25      Enjoy: read, play games on tablet, grandkids      Diet: Veggies, does not eat a lot of fried foods, enjoys chicken and fruit   Caffeine: sodas and coffee (with sugar)   Water: Does not drink a lot at all      Wears seat beat   Does not wear sunscreen   Smoke and carbon monoxide detectors   Does not use phone while driving    Immunization History  Administered Date(s) Administered   Influenza Inj Mdck Quad Pf 12/25/2019   Influenza,inj,Quad PF,6+ Mos 12/21/2018, 02/22/2021, 12/17/2021   Influenza,inj,Quad PF,6-35 Mos 01/10/2019  Influenza-Unspecified 12/20/2022   PFIZER(Purple Top)SARS-COV-2 Vaccination 08/14/2019, 09/10/2019, 02/07/2020   Pneumococcal Polysaccharide-23 02/12/2014   Tdap 02/12/2014   Zoster Recombinant(Shingrix) 12/17/2021, 10/18/2022     Objective: Vital Signs: BP 116/80 (BP Location: Left Arm, Patient Position: Sitting, Cuff Size: Large)   Pulse 87   Resp 16   Ht 5\' 5"  (1.651 m)   Wt 224 lb 3.2 oz (101.7 kg)   LMP 04/01/2021 (Exact Date)   BMI 37.31 kg/m    Physical Exam Vitals and nursing note reviewed.  Constitutional:      Appearance: She is well-developed.  HENT:     Head: Normocephalic and atraumatic.  Eyes:     Conjunctiva/sclera: Conjunctivae normal.  Cardiovascular:     Rate and Rhythm: Normal rate and regular rhythm.     Heart sounds: Normal heart sounds.  Pulmonary:     Effort: Pulmonary effort is normal.     Breath sounds: Normal breath sounds.  Abdominal:     General: Bowel sounds are normal.     Palpations: Abdomen is soft.  Musculoskeletal:     Cervical back:  Normal range of motion.  Lymphadenopathy:     Cervical: No cervical adenopathy.  Skin:    General: Skin is warm and dry.     Capillary Refill: Capillary refill takes less than 2 seconds.  Neurological:     Mental Status: She is alert and oriented to person, place, and time.  Psychiatric:        Behavior: Behavior normal.      Musculoskeletal Exam: Cervical, thoracic and lumbar spine 1 good range of motion.  Shoulders, elbows, wrist joints, MCPs PIPs and DIPs with good range of motion with no synovitis.  Hip joints, deep knee joints were in good range of motion without any warmth swelling or effusion.  There is no tenderness over ankles or MTPs.  CDAI Exam: CDAI Score: -- Patient Global: --; Provider Global: -- Swollen: --; Tender: -- Joint Exam 01/31/2023   No joint exam has been documented for this visit   There is currently no information documented on the homunculus. Go to the Rheumatology activity and complete the homunculus joint exam.  Investigation: No additional findings.  Imaging: No results found.  Recent Labs: Lab Results  Component Value Date   WBC 12.0 (H) 01/17/2023   HGB 13.0 01/17/2023   PLT 374 01/17/2023   NA 146 (H) 01/17/2023   K 4.6 01/17/2023   CL 107 (H) 01/17/2023   CO2 21 01/17/2023   GLUCOSE 102 (H) 01/17/2023   BUN 17 01/17/2023   CREATININE 1.07 (H) 01/17/2023   BILITOT 0.4 01/17/2023   ALKPHOS 95 01/17/2023   AST 21 01/17/2023   ALT 18 01/17/2023   PROT 6.7 01/17/2023   ALBUMIN 4.0 01/17/2023   CALCIUM 9.2 01/17/2023   GFRAA 62 09/23/2020   QFTBGOLD Negative 05/01/2015   QFTBGOLDPLUS NEGATIVE 11/08/2019    Speciality Comments: No specialty comments available.  Procedures:  No procedures performed Allergies: Patient has no known allergies.   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 Craige Cotta in 2020.  Patient was evaluated by Creig Hines in July 2024.  At that visit she  was placed on methotrexate based on the recent CT scan which showed progressive fibrotic changes.  Patient states she took 1 dose of methotrexate and then stopped it.  Methotrexate was resumed again by Dr. Craige Cotta in September.  Patient has been taking methotrexate 2.5 mg  p.o. weekly along with folic acid 1 mg p.o. daily without any interruption.  She denies any side effects from methotrexate.  She continues to have some shortness of breath and cough.  High risk medication use-she is on methotrexate 4 tablets p.o. weekly along with folic acid 1 mg p.o. daily prescribed by Dr. Craige Cotta.  Labs have been monitored by Dr. Craige Cotta.  Labs obtained on January 17, 2023 showed WBC count elevated at 12.8 and creatinine elevated at 1.07.  Patient has known history of chronic renal disease.  Positive ANA (antinuclear antibody) - Low titer positive ANA, ENA negative, anticardiolipin negative, beta-2 GP 1 negative, lupus anticoagulant negative (lupus anticoagulant was positive once).  Primary osteoarthritis of both hands -she denies any discomfort in her hands today.  No synovitis was noted.  X-rays of both hands were obtained on 11/08/2019 which were consistent with osteoarthritis.  No erosive changes were noted.  Trochanteric bursitis of both hips-she has intermittent discomfort in the trochanteric region.  IT band stretches were advised.  Effusion, left knee -she had left knee joint cortisone injection on Aug 30, 2022.  She had no recurrence of swelling since then.  She denies any discomfort today.  Followed by Dr. Roda Shutters.  X-rays of the left knee were updated on 01/28/2022 which were consistent with mild osteoarthritis and minimal periarticular spurring.  Chronic pain of left knee - Recurrent left knee joint effusion in the past.  Stage 3a chronic kidney disease (HCC) - Followed by Dr. Wolfgang Phoenix.  CKD since 2017.  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 Lucio Edward office visit note on 09/15/21.  Bilateral pulmonary embolism (HCC) -  Unprovoked.  She is on on anticoagulation-xarelto.  She is followed by hematology.  Other medical problems are listed as follows:  Pulmonary hypertension (HCC)  Bronchiectasis without complication (HCC) - Under the care of Dr. Craige Cotta.  Essential hypertension-blood pressure was normal at 116/80.  Mixed hyperlipidemia  Vitamin D deficiency  Secondary hyperparathyroidism (HCC)  OSA (obstructive sleep apnea) - Under the care of Dr. Craige Cotta.  Daytime hypersomnolence  Orders: No orders of the defined types were placed in this encounter.  No orders of the defined types were placed in this encounter.    Follow-Up Instructions: Return in about 6 months (around 08/01/2023) for Sarcoidosis and osteoarthritis.   Pollyann Savoy, MD  Note - This record has been created using Animal nutritionist.  Chart creation errors have been sought, but may not always  have been located. Such creation errors do not reflect on  the standard of medical care.

## 2023-01-18 NOTE — Assessment & Plan Note (Signed)
She takes rosuvastatin 10 mg daily Reports treatment compliance Encouraged decreasing her intake of greasy, starchy, fatty foods with increased physical activity Lab Results  Component Value Date   CHOL 199 01/17/2023   HDL 93 01/17/2023   LDLCALC 96 01/17/2023   TRIG 56 01/17/2023   CHOLHDL 2.1 01/17/2023

## 2023-01-18 NOTE — Assessment & Plan Note (Signed)
The patient reports involuntary leakage of fluid, especially at nighttime. She states that she has performed Kegel exercises in the past for symptom relief. She denies fever or chills. Overactive bladder Please start taking Myrbetriq 25 mg daily  Nonpharmacological interventions for managing overactive bladder (OAB) can significantly improve symptoms and quality of life. Here are several effective strategies: -Pelvic Floor Exercises (Kegel Exercises) Strengthening the pelvic floor muscles can help improve bladder control. To perform Kegel exercises, contract the pelvic floor muscles (as if stopping the flow of urine), hold for a few seconds, then relax. Repeat several times a day. -Fluid Management Monitor Fluid Intake: While staying hydrated is essential, be mindful of the types and timing of fluid intake. Reduce consumption of diuretics like caffeine and alcohol, especially in the evening. Evening Fluid Restriction: Limit fluid intake a few hours before bedtime to minimize nighttime trips to the bathroom. -Dietary Modifications Avoid bladder irritants such as spicy foods, citrus fruits, artificial sweeteners, and carbonated beverages that may exacerbate symptoms.

## 2023-01-19 ENCOUNTER — Ambulatory Visit: Payer: BC Managed Care – PPO | Attending: Nurse Practitioner | Admitting: Nurse Practitioner

## 2023-01-19 ENCOUNTER — Encounter: Payer: Self-pay | Admitting: Nurse Practitioner

## 2023-01-19 VITALS — BP 130/80 | HR 67 | Ht 65.0 in | Wt 219.4 lb

## 2023-01-19 DIAGNOSIS — J479 Bronchiectasis, uncomplicated: Secondary | ICD-10-CM

## 2023-01-19 DIAGNOSIS — D860 Sarcoidosis of lung: Secondary | ICD-10-CM

## 2023-01-19 DIAGNOSIS — I272 Pulmonary hypertension, unspecified: Secondary | ICD-10-CM

## 2023-01-19 DIAGNOSIS — E785 Hyperlipidemia, unspecified: Secondary | ICD-10-CM

## 2023-01-19 DIAGNOSIS — I7121 Aneurysm of the ascending aorta, without rupture: Secondary | ICD-10-CM

## 2023-01-19 DIAGNOSIS — N1831 Chronic kidney disease, stage 3a: Secondary | ICD-10-CM

## 2023-01-19 DIAGNOSIS — R42 Dizziness and giddiness: Secondary | ICD-10-CM

## 2023-01-19 DIAGNOSIS — I10 Essential (primary) hypertension: Secondary | ICD-10-CM

## 2023-01-19 DIAGNOSIS — R0789 Other chest pain: Secondary | ICD-10-CM | POA: Diagnosis not present

## 2023-01-19 DIAGNOSIS — G4733 Obstructive sleep apnea (adult) (pediatric): Secondary | ICD-10-CM

## 2023-01-19 DIAGNOSIS — Z86711 Personal history of pulmonary embolism: Secondary | ICD-10-CM

## 2023-01-19 NOTE — Patient Instructions (Addendum)

## 2023-01-19 NOTE — Progress Notes (Signed)
Office Visit    Patient Name: Hailey Ross Date of Encounter: 01/19/2023 PCP:  Gilmore Laroche, FNP [facility]  Cardiologist:  Dina Rich, MD  Advanced Practice Provider:  No care team member to display Electrophysiologist:  None    Chief Complaint and HPI    Hailey Ross is a very pleasant 52 y.o. female with a hx of palpitations, sarcoidosis/bronchiectasis, CKD stage 3a (sees Dr. Wolfgang Phoenix), thoracic ascending aortic aneurysm (noted on CT scan 09/2022), chronic hx of lightheadedness, pulmonary HTN, OSA on CPAP, history of unprovoked PE, and hyperlipidemia, who presents today for chest pain/dizziness evaluation.    30-day event monitor in 2021 revealed no significant arrhythmias, was started on diltiazem 30 mg twice daily for symptoms of palpitations.  History of bilateral PEs found in 2021, repeat CT PE 1 month later showed resolution of PEs.  Repeat monitor in 2022 revealed rare PACs, rare episodes of SVT up to 12 beats, rare PVCs.  History of lightheadedness, seen by neurology.  EEG normal, MRI and carotid Doppler benign.  Closely followed by pulmonary for history of sarcoidosis/bronchiectasis.   Last seen by Dr. Dina Rich on July 06, 2021.  Was instructed that she could take an additional 30 mg of diltiazem as needed for palpitations.  Underwent orthostatics for lightheadedness, negative.  Recommended to follow-up with neurology and stated if no clear etiology found, to consider trial of midodrine.  11/08/2022 - Prior to office visit, she contacted our office noting chest pain. Noted this started 2 weeks ago, better recently. Described CP as an intermittent, dull burning sensation along left breast that she described as subtle. Described a sensation along her left shoulder and neck. Saw her Neurologist 2 weeks ago for her longstanding history of lightheadedness who told her it is coming from her heart. Her intermittent lightheadedness mainly  noticed with extended periods of standing/walking. BP was labile recently. Denied any worsening shortness of breath, recent palpitations, syncope, presyncope, dizziness, orthopnea, PND, swelling or significant weight changes, acute bleeding, or claudication.   CCTA 11/2022 revealed coronary calcium score of 0. Echo report noted below.   Hospital admission 12/05/2022  - 12/06/2022 for acute respiratory failure with hypoxia. Patient noted HA, productive cough that was reported to be occasionally blood tinged, noted recent episode of epistaxis with chest congestion, wheezing, and chest tightness/heaviness. 2 view CXR revealed chronic pulmonary interstitial changes, nothing acute CTA of chest showed chronic changes consistent with sarcoidosis, no evidence of PE, ascending aorta measuring 3.9 cm. Instructed to f/u with pulmonary and instructed to continue Augmentin and prednisone.   Today she presents for follow-up. Doing better since leaving the hospital. Continues to note mild intermittent chest pain, doesn't feel burning sensation as previously, said this has been ongoing for a while. Notes pain from left wrist that radiates to her shoulder, previous MD stated she may have a mild case of carpal tunnel syndrome. Says her PCP recently increased her BP medication as she has noticed this has been elevated recently. Denies any palpitations, syncope, presyncope, dizziness, orthopnea, PND, swelling or significant weight changes, acute bleeding, or claudication. Still notes lightheadedness, has been evaluated by Neuro.   SH: Husband is also a patient of mine.   EKGs/Labs/Other Studies Reviewed:   The following studies were reviewed today:   EKG:  EKG is not ordered today.    Echo 11/2022:   1. Left ventricular ejection fraction, by estimation, is 60 to 65%. The  left ventricle has normal function. The  left ventricle has no regional  wall motion abnormalities. Left ventricular diastolic parameters were   normal.   2. Right ventricular systolic function is normal. The right ventricular  size is normal.   3. The mitral valve is normal in structure. No evidence of mitral valve regurgitation. No evidence of mitral stenosis.   4. The tricuspid valve is abnormal.   5. The aortic valve is tricuspid. Aortic valve regurgitation is not  visualized. No aortic stenosis is present.   6. Aortic dilatation noted. 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   7. The inferior vena cava is normal in size with greater than 50%  respiratory variability, suggesting right atrial pressure of 3 mmHg.   Comparison(s): EF 55-60%. No RWMA.  CCTA 11/2022:  IMPRESSION: 1. Coronary calcium score of 0. This was 0 percentile for age and sex matched control.   2. Normal coronary origin with right dominance.   3. CAD-RADS 0. No evidence of CAD (0%). Consider non-atherosclerotic causes of chest pain.   The noncardiac portion of this study will be interpreted in separate report by the radiologist.  IMPRESSION: Pulmonary interstitial fibrosis and bronchiectasis which was more fully evaluated on the recent prior study.  CT chest 09/21/2022:  IMPRESSION: 1. Chronic imaging stigmata compatible with reported clinical history of sarcoidosis, with mild progression compared to prior study from 08/13/2021, as above. 2. Dilatation of the pulmonic trunk (4.2 cm in diameter), concerning for pulmonary arterial hypertension. 3. Aortic atherosclerosis with mild aneurysmal dilatation of the ascending thoracic aorta (4.5 cm in diameter).   Aortic Atherosclerosis (ICD10-I70.0). Aortic aneurysm NOS (ICD10-I71.9).  Myoview 03/2021:    Lexiscan stress is electrically negative for ischemia   Myoview scan with probable normal perfusion and mild soft tissue attenuaton (breast)   No significant ischemia or scar   LVEF is 67% with normal wall motion.   Low risk study   Monitor 12/2020:  14 day  monitor Rare supraventricular ectopy in the form of isolated PACs, coupletes, triplets. Rare episodes of SVT up to 12 beats. Rare ventricular ectopy in the form of isolated PVCs, couplets Reported symptoms correlate with sinus rhythm     Patch Wear Time:  13 days and 23 hours (2022-06-14T11:36:38-0400 to 2022-06-28T10:57:53-0400)   Patient had a min HR of 45 bpm, max HR of 151 bpm, and avg HR of 73 bpm. Predominant underlying rhythm was Sinus Rhythm. 11 Supraventricular Tachycardia runs occurred, the run with the fastest interval lasting 9 beats with a max rate of 148 bpm, the  longest lasting 12 beats with an avg rate of 122 bpm. Isolated SVEs were rare (<1.0%), SVE Couplets were rare (<1.0%), and SVE Triplets were rare (<1.0%). Isolated VEs were rare (<1.0%), VE Couplets were rare (<1.0%), and no VE Triplets were present.   Echo 10/2020:  1. Left ventricular ejection fraction, by estimation, is 55 to 60%. The  left ventricle has normal function. The left ventricle has no regional  wall motion abnormalities. Left ventricular diastolic parameters were  normal. Normal global longitudinal  strain of -20.3%.   2. Right ventricular systolic function is normal. The right ventricular  size is normal. There is normal pulmonary artery systolic pressure. The  estimated right ventricular systolic pressure is 35.5 mmHg.   3. The mitral valve is grossly normal. Trivial mitral valve  regurgitation.   4. The aortic valve is tricuspid. Aortic valve regurgitation is not  visualized.   5. The inferior vena cava is normal in  size with greater than 50%  respiratory variability, suggesting right atrial pressure of 3 mmHg.   Comparison(s): Echocardiogram done 09/13/19 showed an EF of 55-60%.   Risk Assessment/Calculations:   The 10-year ASCVD risk score (Arnett DK, et al., 2019) is: 2.2%   Values used to calculate the score:     Age: 87 years     Sex: Female     Is Non-Hispanic African American: Yes      Diabetic: No     Tobacco smoker: No     Systolic Blood Pressure: 130 mmHg     Is BP treated: Yes     HDL Cholesterol: 93 mg/dL     Total Cholesterol: 199 mg/dL     Review of Systems    All other systems reviewed and are otherwise negative except as noted above.  Physical Exam    VS:  BP 130/80   Pulse 67   Ht 5\' 5"  (1.651 m)   Wt 219 lb 6.4 oz (99.5 kg)   LMP 04/01/2021 (Exact Date)   SpO2 97%   BMI 36.51 kg/m  , BMI Body mass index is 36.51 kg/m.  Wt Readings from Last 3 Encounters:  01/19/23 219 lb 6.4 oz (99.5 kg)  01/17/23 221 lb 1.9 oz (100.3 kg)  12/19/22 218 lb 0.6 oz (98.9 kg)     GEN: Well nourished, well developed, in no acute distress. HEENT: normal. Neck: Supple, no JVD, carotid bruits, or masses. Cardiac: S1/S2, RRR, no murmurs, rubs, or gallops. No clubbing, cyanosis, edema.  Radials/PT 2+ and equal bilaterally.  Respiratory:  Respirations regular and unlabored, clear to auscultation bilaterally. MS: No deformity or atrophy. Skin: Warm and dry, no rash. Neuro:  Strength and sensation are intact. Psych: Normal affect.  Assessment & Plan    Atypical chest pain Atypical features of her chest pain. Past NST in 2022 negative. Recent CCTA with coronary calcium score of 0. Provided reassurance to patient that etiology of patient's symptoms is most likely not cardiac. No medication changes at this time. Heart healthy diet and regular cardiovascular exercise encouraged. ED precautions discussed.  Lightheadedness Longstanding history. Past orthostatics negative in office. Follow-up with Neurology as scheduled. Discussed trialing Midodrine with aptient, however came to shared medical decision making that we will hold off for now d/t her recent elevated BP readings.   Pulmonary HTN, Sarcoidosis/Bronchiectasis, hx of unprovoked PE, OSA Denies any recent worsening symptoms. Continue to follow-up with Pulmonology. Continue Xarelto, denies any bleeding issues, on  appropriate dosage. Encouraged compliance with CPAP machine.   HLD LDL 96 01/2023. Managed by PCP. Continue current medication regimen. Heart healthy diet and regular cardiovascular exercise encouraged.   6. Thoracic ascending aortic aneurysm Recent CT imaging revealed ascending aorta measuring 3.9 cm. Stable. Will continue to monitor over time. Care precautions and ED precautions discussed.   7. CKD stage 3a Most recent labs show stable kidney function. Avoid nephrotoxic agents. Continue to follow-up with Nephrology and PCP.   8. HTN BP today 130/80. Has had recently increased BP readings. BP medication recently increased by PCP. Encouraged consistent use of CPAP, verbalized understanding, as this could be contributing to her elevated readings. Continue current medication regimen. Discussed to monitor BP at home at least 2 hours after medications and sitting for 5-10 minutes. BP log and salty six diet sheet given. Discussed to contact our office if SBP consistently > 130. Discussed goal SBP < 130.   Disposition: Follow up in 6 months with Dina Rich, MD or  APP.  Signed, Sharlene Dory, NP 01/19/2023, 11:04 AM [facility]

## 2023-01-23 ENCOUNTER — Other Ambulatory Visit: Payer: Self-pay | Admitting: Family Medicine

## 2023-01-23 DIAGNOSIS — E559 Vitamin D deficiency, unspecified: Secondary | ICD-10-CM

## 2023-01-23 MED ORDER — VITAMIN D (ERGOCALCIFEROL) 1.25 MG (50000 UNIT) PO CAPS: 50000.0000 [IU] | ORAL_CAPSULE | ORAL | 1 refills | Status: DC

## 2023-01-24 ENCOUNTER — Ambulatory Visit: Payer: BC Managed Care – PPO | Admitting: Adult Health

## 2023-01-24 ENCOUNTER — Encounter: Payer: Self-pay | Admitting: Adult Health

## 2023-01-24 VITALS — BP 110/80 | HR 63 | Temp 98.1°F | Ht 65.0 in | Wt 219.4 lb

## 2023-01-24 DIAGNOSIS — I2699 Other pulmonary embolism without acute cor pulmonale: Secondary | ICD-10-CM

## 2023-01-24 DIAGNOSIS — J479 Bronchiectasis, uncomplicated: Secondary | ICD-10-CM | POA: Diagnosis not present

## 2023-01-24 DIAGNOSIS — N182 Chronic kidney disease, stage 2 (mild): Secondary | ICD-10-CM

## 2023-01-24 DIAGNOSIS — D860 Sarcoidosis of lung: Secondary | ICD-10-CM

## 2023-01-24 DIAGNOSIS — G4733 Obstructive sleep apnea (adult) (pediatric): Secondary | ICD-10-CM

## 2023-01-24 NOTE — Assessment & Plan Note (Addendum)
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  She has now been weaned off of prednisone.  She is currently on methotrexate 10 mg weekly .  She remains on folic acid daily.  Lab work last week was reviewed and appears stable.  Last high-resolution CT chest was June 2024 that showed progressive fibrotic changes.  Most recent PFTs were March 2024 that showed decline in lung function.-Will check spirometry with [facility] on return visit. May be candidate for anti fibrotic therapy . Echo with no pulmonary hyptertension  Continue with yearly eye exams.  Plan  Patient Instructions  Continue on  Methotrexate 2.5mg  4 tabs weekly- Mondays  Continue Folic acid 1mg  daily.  Do not drink alcohol while taking.  Continue on Xarleto 20mg  daily  Continue on Breztri 2 puffs Twice daily, rinse after use.  Albuterol inhaler As needed   Wear CPAP At bedtime   Follow up in  2  months  with Dr. Marchelle Gearing in [facility] clinic spot and As needed  (new patient)

## 2023-01-24 NOTE — Assessment & Plan Note (Signed)
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  Continue on current regimen with Xarelto.  Patient education given

## 2023-01-24 NOTE — Addendum Note (Signed)
Addended by: Delrae Rend on: 01/24/2023 01:29 PM   Modules accepted: Orders

## 2023-01-24 NOTE — Assessment & Plan Note (Signed)
Continue on mucociliary clearance regimen.  Patient is improved clinically on Breztri.  Check spirometry with [facility] on return visit

## 2023-01-24 NOTE — Patient Instructions (Addendum)
Continue on  Methotrexate 2.5mg  4 tabs weekly- Mondays  Continue Folic acid 1mg  daily.  Do not drink alcohol while taking.  Continue on Xarleto 20mg  daily  Continue on Breztri 2 puffs Twice daily, rinse after use.  Albuterol inhaler As needed   Wear CPAP At bedtime   Follow up in  2  months  with Dr. Marchelle Gearing in [facility] clinic spot and As needed  (new patient)

## 2023-01-24 NOTE — Assessment & Plan Note (Signed)
Encouraged to use CPAP each night

## 2023-01-24 NOTE — Progress Notes (Signed)
@Patient  ID: Hailey Ross, female    DOB: May 16, 1970, 52 y.o.   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 Weaned off Steroids 2018   TEST/EVENTS :  Labs 04/21/15 >> HIV negative, Quantiferon gold negative, ACE 195, ANA, RF negative, ESR 32     Chest imaging:  CT chest 03/18/15 >> biapical thickening with traction BTX, BTX RML and lingula, subcarinal LAN 1.7 cm, patchy increased interstitial markings   HRCT chest 03/07/18 >> borderline LAN, patchy GGO, septal thickening, architectural distortion, cylindrical and varicose BTX    CT chest June 18, 2019 showed peripheral segmental and subsegmental bilateral pulmonary emboli, bulky mediastinal and hilar lymph nodes, bilateral apical and lingular honeycombing and bilateral upper lobe mild traction bronchiectasis.  Nonspecific groundglass opacities in the left base and right middle lobe.  Not significantly changed   CT chest Aug 13, 2021 right hilar and mediastinal adenopathy similar, biapical subpleural fibrosis with honeycombing and traction bronchiectasis cluster of groundglass density in the right lower lobe, negative for PE   High-resolution CT chest in September 15, 2022 showed patchy areas of groundglass attenuation, septal thickening, subpleural reticulation and bronchiectasis with scattered areas of honeycombing mid to upper lung predominance with more advanced fibrotic changes mild  progression since May 2023   Venous Dopplers were negative for DVT.     2D echo showed a preserved EF.  Mildly reduced right ventricular systolic function and moderately elevated pulmonary artery systolic pressure at 57 mmHg.  Moderate grade 3 protruding plaque involving the distal aortic arch and proximal descending aorta   Echo 09/2019    Home sleep study was done July 22, 2019.  This showed mild sleep apnea with an AHI at 9.5/hour and SPO2 low at 74%  PFT  PFT 05/26/15 >> FEV1 1.67 (66%), FEV1% 88, TLC 2/78 (53%), DLCO 36%   09/26/2019-pulmonary function test-FEV1 54%, ratio 91, FVC 47%, DLCO 45%   PFTs done on June 20, 2022 showed decreased lung function with FEV1 at 36%, ratio 83, FVC 34%, no significant bronchodilator response, total lung capacity 62%, DLCO 32%.   01/24/2023 Follow up : Fibrotic sarcoidosis, bronchiectasis, sleep apnea Patient presents for 1 month follow-up.  Patient is followed for chronic fibrotic pulmonary sarcoidosis.  Patient has had clinical decline over last year decreased activity tolerance,  decreased lung function on PFTs and progression of fibrotic changes on CT chest.  Patient was started on methotrexate October 18, 2022.  Unfortunately patient stopped medication and was off for couple weeks.  She was restarted back on methotrexate December 13, 2022. Last visit was having a slow to resolved asthmatic bronchitis after admission in August.  Was recommended to finish up a course of antibiotics.  And then restart her methotrexate.  Patient was restarted on prednisone last visit 20 mg daily for 1 week with a slow taper by 5 mg each week and then stop.  She is now off steroids. Patient says she remains on methotrexate 2.5 mg 4 tablets weekly on Mondays   She is also taking  folic acid.  Labs last week showed stable kidney function and LFTs. No flare of cough or wheezing . No hemoptysis . Flu shot is utd.  Not that active, tries to walk some.   She has underlying sleep  apnea.  She remains on CPAP at bedtime.  Says she tries to wear her CPAP each night . CPAP download was requested. Using the nasal mask.   Patient has a history of unprovoked PE with a positive lupus anticoagulant.  She remains on lifelong Xarelto.  Endorses compliance.  Applying for disability, unable to work due to medical issues.    No Known Allergies  Immunization History  Administered Date(s) Administered   Influenza Inj Mdck Quad Pf 12/25/2019   Influenza,inj,Quad PF,6+ Mos 12/21/2018, 02/22/2021, 12/17/2021   Influenza,inj,Quad PF,6-35 Mos 01/10/2019   Influenza-Unspecified 12/20/2022   PFIZER(Purple Top)SARS-COV-2 Vaccination 08/14/2019, 09/10/2019, 02/07/2020   Pneumococcal Polysaccharide-23 02/12/2014   Tdap 02/12/2014   Zoster Recombinant(Shingrix) 12/17/2021, 10/18/2022    Past Medical History:  Diagnosis Date   Acute cholecystitis 08/24/2016   Acute kidney injury superimposed on chronic kidney disease (HCC) 06/20/2019   Acute respiratory failure with hypoxia (HCC) 06/19/2019   Annual visit for general adult medical examination with abnormal findings 04/16/2019   Arthritis    right knee   Bronchiectasis (HCC)    CKD (chronic kidney disease)    Iron deficiency    Lightheadedness 05/10/2019   PE (pulmonary thromboembolism) (HCC) 06/2019   Pre-syncope 04/16/2019   Sarcoidosis    Sarcoidosis    Sleep apnea    c pap   Smoker 02/12/2014    Tobacco History: Social History   Tobacco Use  Smoking Status Former   Current packs/day: 0.00   Average packs/day: 1 pack/day for 20.0 years (20.0 ttl pk-yrs)   Types: Cigarettes   Start date: 03/21/1995   Quit date: 03/21/2015   Years since quitting: 7.8   Passive exposure: Past  Smokeless Tobacco Never   Counseling given: Not Answered   Outpatient Medications Prior to Visit  Medication Sig Dispense Refill   acetaminophen (TYLENOL) 500 MG tablet Take 500 mg by mouth every 4 (four) hours as needed for mild pain, moderate  pain or headache.      albuterol (VENTOLIN HFA) 108 (90 Base) MCG/ACT inhaler Inhale 2 puffs into the lungs every 6 (six) hours as needed for wheezing or shortness of breath. 8 g 0   azelastine (ASTELIN) 0.1 % nasal spray Place 1 spray into both nostrils daily at 2 am. Use in each nostril as directed 30 mL 12   Budeson-Glycopyrrol-Formoterol (BREZTRI AEROSPHERE) 160-9-4.8 MCG/ACT AERO Inhale 2 puffs into the lungs in the morning and at bedtime. 10.7 g 3   diltiazem (CARDIZEM) 30 MG tablet Take 1 tablet (30 mg total) by mouth daily as needed (For palpitations). May take an additional 30 mg tablet daily as needed for palpitations 135 tablet 1   LORazepam (ATIVAN) 0.5 MG tablet Take 0.5 mg by mouth daily as needed for anxiety.     methotrexate (RHEUMATREX) 2.5 MG tablet Take 4 tablets (10 mg total) by mouth once a week. 16 tablet 0   metoCLOPramide (REGLAN) 5 MG tablet Take 1 tablet (5 mg total) by mouth 2 (two) times daily. 60 tablet 5   mirabegron ER (MYRBETRIQ) 25 MG TB24 tablet Take 1 tablet (25 mg total) by mouth daily. 30 tablet 1   olmesartan (BENICAR) 20 MG tablet Take 1 tablet (20 mg total) by mouth daily. 30 tablet 1  pantoprazole (PROTONIX) 40 MG tablet TAKE 1 TABLET(40 MG) BY MOUTH DAILY 90 tablet 3   rosuvastatin (CRESTOR) 10 MG tablet Take 10 mg by mouth at bedtime.     Vitamin D, Ergocalciferol, (DRISDOL) 1.25 MG (50000 UNIT) CAPS capsule Take 1 capsule (50,000 Units total) by mouth every 7 (seven) days. 20 capsule 1   XARELTO 20 MG TABS tablet TAKE 1 TABLET(20 MG) BY MOUTH DAILY WITH SUPPER 90 tablet 3   No facility-administered medications prior to visit.     Review of Systems:   Constitutional:   No  weight loss, night sweats,  Fevers, chills, + fatigue, or  lassitude.  HEENT:   No headaches,  Difficulty swallowing,  Tooth/dental problems, or  Sore throat,                No sneezing, itching, ear ache, nasal congestion, post nasal drip,   CV:  No chest pain,  Orthopnea,  PND, swelling in lower extremities, anasarca, dizziness, palpitations, syncope.   GI  No heartburn, indigestion, abdominal pain, nausea, vomiting, diarrhea, change in bowel habits, loss of appetite, bloody stools.   Resp:   No chest wall deformity  Skin: no rash or lesions.  GU: no dysuria, change in color of urine, no urgency or frequency.  No flank pain, no hematuria   MS:  No joint pain or swelling.  No decreased range of motion.  No back pain.    Physical Exam  BP 110/80 (BP Location: Left Arm, Patient Position: Sitting, Cuff Size: Large)   Pulse 63   Temp 98.1 F (36.7 C) (Oral)   Ht 5\' 5"  (1.651 m)   Wt 219 lb 6.4 oz (99.5 kg)   LMP 04/01/2021 (Exact Date)   SpO2 95%   BMI 36.51 kg/m   GEN: A/Ox3; pleasant , NAD, well nourished    HEENT:  [place]/AT,  NOSE-clear, THROAT-clear, no lesions, no postnasal drip or exudate noted.   NECK:  Supple w/ fair ROM; no JVD; normal carotid impulses w/o bruits; no thyromegaly or nodules palpated; no lymphadenopathy.    RESP  Clear  P & A; w/o, wheezes/ rales/ or rhonchi. no accessory muscle use, no dullness to percussion  CARD:  RRR, no m/r/g, no peripheral edema, pulses intact, no cyanosis or clubbing.  GI:   Soft & nt; nml bowel sounds; no organomegaly or masses detected.   Musco: Warm bil, no deformities or joint swelling noted.   Neuro: alert, no focal deficits noted.    Skin: Warm, no lesions or rashes    Lab Results:  CBC    Component Value Date/Time   WBC 12.0 (H) 01/17/2023 1045   WBC 12.7 (H) 12/19/2022 0915   RBC 4.77 01/17/2023 1045   RBC 4.45 12/19/2022 0915   HGB 13.0 01/17/2023 1045   HCT 41.4 01/17/2023 1045   PLT 374 01/17/2023 1045   MCV 87 01/17/2023 1045   MCH 27.3 01/17/2023 1045   MCH 27.4 12/19/2022 0915   MCHC 31.4 (L) 01/17/2023 1045   MCHC 30.9 12/19/2022 0915   RDW 12.8 01/17/2023 1045   LYMPHSABS 3.5 (H) 01/17/2023 1045   MONOABS 0.4 12/05/2022 2159   EOSABS 0.0 01/17/2023 1045    BASOSABS 0.0 01/17/2023 1045    BMET    Component Value Date/Time   NA 146 (H) 01/17/2023 1045   K 4.6 01/17/2023 1045   CL 107 (H) 01/17/2023 1045   CO2 21 01/17/2023 1045   GLUCOSE 102 (H) 01/17/2023 1045  GLUCOSE 149 (H) 12/05/2022 2159   BUN 17 01/17/2023 1045   CREATININE 1.07 (H) 01/17/2023 1045   CREATININE 1.27 (H) 08/30/2022 1041   CALCIUM 9.2 01/17/2023 1045   GFRNONAA 50 (L) 12/05/2022 2159   GFRNONAA 53 (L) 09/23/2020 1458   GFRAA 62 09/23/2020 1458    BNP    Component Value Date/Time   BNP 26.0 12/05/2022 2159    ProBNP No results found for: "PROBNP"  Imaging: No results found.  Administration History     None          Latest Ref Rng & Units 06/20/2022   11:05 AM 09/26/2019    2:56 PM 05/26/2015    2:59 PM  PFT Results  FVC-Pre L 1.25  P 1.25  1.78   FVC-Predicted Pre % 34  P 34  57   FVC-Post L 1.16  P 1.16  1.88   FVC-Predicted Post % 32  P 32  60   Pre FEV1/FVC % % 83  P 83  85   Post FEV1/FCV % % 87  P 87  88   FEV1-Pre L 1.04  P 1.04  1.51   FEV1-Predicted Pre % 36  P 36  59   FEV1-Post L 1.01  P 1.01  1.67   DLCO uncorrected ml/min/mmHg 6.93  P 6.93  9.41   DLCO UNC% % 32  P 32  36   DLCO corrected ml/min/mmHg 6.93  P 6.93  9.38   DLCO COR %Predicted % 32  P 32  36   DLVA Predicted % 67  P 67  73   TLC L 3.23  P 3.23  2.78   TLC % Predicted % 62  P 62  53   RV % Predicted % 77  P 77  44     P Preliminary result    No results found for: "NITRICOXIDE"      Assessment & Plan:   Sarcoidosis of lung (HCC) Progressive fibrotic pulmonary sarcoidosis with bronchiectasis-started on methotrexate July 2024 .  She has now been weaned off of prednisone.  She is currently on methotrexate 10 mg weekly .  She remains on folic acid daily.  Lab work last week was reviewed and appears stable.  Last high-resolution CT chest was June 2024 that showed progressive fibrotic changes.  Most recent PFTs were March 2024 that showed decline in lung  function.-Will check spirometry with DLCO on return visit.  Plan  Patient Instructions  Continue on  Methotrexate 2.5mg  4 tabs weekly- Mondays  Continue Folic acid 1mg  daily.  Do not drink alcohol while taking.  Continue on Xarleto 20mg  daily  Continue on Breztri 2 puffs Twice daily, rinse after use.  Albuterol inhaler As needed   Wear CPAP At bedtime   Follow up in  2  months  with Dr. Marchelle Gearing in [facility] clinic spot and As needed  (new patient)      OSA (obstructive sleep apnea) Encouraged to use CPAP each night  Bilateral pulmonary embolism (HCC) History of unprovoked PE on lifelong anticoagulation therapy.  Continue on current regimen with Xarelto.  Patient education given  Bronchiectasis (HCC) Continue on mucociliary clearance regimen.  Patient is improved clinically on Breztri.  Check spirometry with DLCO on return visit   I spent 45   minutes dedicated to the care of this patient on the date of this encounter to include pre-visit review of records, face-to-face time with the patient discussing conditions above, post visit ordering of testing,  clinical documentation with the electronic health record, making appropriate referrals as documented, and communicating necessary findings to members of the patients care team.    Rubye Oaks, NP 01/24/2023

## 2023-01-24 NOTE — Assessment & Plan Note (Signed)
CKD -Scr /GFR improved. Continue on lower dose Methotrexate. Will send message to Nephrology regarding Methotrexate, may need to change due to hx of CKD. Monitor renal fxn closely .  Cont follow up with Nephrology

## 2023-01-31 ENCOUNTER — Ambulatory Visit: Payer: BC Managed Care – PPO | Attending: Rheumatology | Admitting: Rheumatology

## 2023-01-31 ENCOUNTER — Encounter: Payer: Self-pay | Admitting: Rheumatology

## 2023-01-31 VITALS — BP 116/80 | HR 87 | Resp 16 | Ht 65.0 in | Wt 224.2 lb

## 2023-01-31 DIAGNOSIS — Z01419 Encounter for gynecological examination (general) (routine) without abnormal findings: Secondary | ICD-10-CM | POA: Diagnosis not present

## 2023-01-31 DIAGNOSIS — M7061 Trochanteric bursitis, right hip: Secondary | ICD-10-CM

## 2023-01-31 DIAGNOSIS — M19042 Primary osteoarthritis, left hand: Secondary | ICD-10-CM

## 2023-01-31 DIAGNOSIS — I10 Essential (primary) hypertension: Secondary | ICD-10-CM

## 2023-01-31 DIAGNOSIS — Z79899 Other long term (current) drug therapy: Secondary | ICD-10-CM

## 2023-01-31 DIAGNOSIS — N1831 Chronic kidney disease, stage 3a: Secondary | ICD-10-CM

## 2023-01-31 DIAGNOSIS — N2581 Secondary hyperparathyroidism of renal origin: Secondary | ICD-10-CM

## 2023-01-31 DIAGNOSIS — D869 Sarcoidosis, unspecified: Secondary | ICD-10-CM

## 2023-01-31 DIAGNOSIS — M19041 Primary osteoarthritis, right hand: Secondary | ICD-10-CM

## 2023-01-31 DIAGNOSIS — G8929 Other chronic pain: Secondary | ICD-10-CM

## 2023-01-31 DIAGNOSIS — R768 Other specified abnormal immunological findings in serum: Secondary | ICD-10-CM

## 2023-01-31 DIAGNOSIS — M25562 Pain in left knee: Secondary | ICD-10-CM

## 2023-01-31 DIAGNOSIS — E782 Mixed hyperlipidemia: Secondary | ICD-10-CM

## 2023-01-31 DIAGNOSIS — I272 Pulmonary hypertension, unspecified: Secondary | ICD-10-CM

## 2023-01-31 DIAGNOSIS — I2699 Other pulmonary embolism without acute cor pulmonale: Secondary | ICD-10-CM

## 2023-01-31 DIAGNOSIS — M25462 Effusion, left knee: Secondary | ICD-10-CM

## 2023-01-31 DIAGNOSIS — J479 Bronchiectasis, uncomplicated: Secondary | ICD-10-CM

## 2023-01-31 DIAGNOSIS — E559 Vitamin D deficiency, unspecified: Secondary | ICD-10-CM

## 2023-01-31 DIAGNOSIS — M7062 Trochanteric bursitis, left hip: Secondary | ICD-10-CM

## 2023-01-31 DIAGNOSIS — M79642 Pain in left hand: Secondary | ICD-10-CM

## 2023-01-31 DIAGNOSIS — G4719 Other hypersomnia: Secondary | ICD-10-CM

## 2023-01-31 DIAGNOSIS — R7689 Other specified abnormal immunological findings in serum: Secondary | ICD-10-CM

## 2023-01-31 DIAGNOSIS — Z124 Encounter for screening for malignant neoplasm of cervix: Secondary | ICD-10-CM | POA: Diagnosis not present

## 2023-01-31 DIAGNOSIS — Z1231 Encounter for screening mammogram for malignant neoplasm of breast: Secondary | ICD-10-CM | POA: Diagnosis not present

## 2023-01-31 DIAGNOSIS — G4733 Obstructive sleep apnea (adult) (pediatric): Secondary | ICD-10-CM

## 2023-01-31 NOTE — Patient Instructions (Signed)
Vaccines You are taking a medication(s) that can suppress your immune system.  The following immunizations are recommended: Flu annually Covid-19  RSV Td/Tdap (tetanus, diphtheria, pertussis) every 10 years Pneumonia (Prevnar 15 then Pneumovax 23 at least 1 year apart.  Alternatively, can take Prevnar 20 without needing additional dose) Shingrix: 2 doses from 4 weeks to 6 months apart  Please check with your PCP to make sure you are up to date.   If you have signs or symptoms of an infection or start antibiotics: First, call your PCP for workup of your infection. Hold your medication through the infection, until you complete your antibiotics, and until symptoms resolve if you take the following: Injectable medication (Actemra, Benlysta, Cimzia, Cosentyx, Enbrel, Humira, Kevzara, Orencia, Remicade, Simponi, Stelara, Taltz, Tremfya) Methotrexate Leflunomide (Arava) Mycophenolate (Cellcept) Harriette Ohara, Olumiant, or Rinvoq

## 2023-02-16 DIAGNOSIS — R9230 Dense breasts, unspecified: Secondary | ICD-10-CM | POA: Diagnosis not present

## 2023-02-17 DIAGNOSIS — N6019 Diffuse cystic mastopathy of unspecified breast: Secondary | ICD-10-CM | POA: Diagnosis not present

## 2023-02-17 DIAGNOSIS — G4733 Obstructive sleep apnea (adult) (pediatric): Secondary | ICD-10-CM | POA: Diagnosis not present

## 2023-02-20 ENCOUNTER — Ambulatory Visit (INDEPENDENT_AMBULATORY_CARE_PROVIDER_SITE_OTHER): Payer: BC Managed Care – PPO | Admitting: Family Medicine

## 2023-02-20 ENCOUNTER — Encounter: Payer: Self-pay | Admitting: Family Medicine

## 2023-02-20 VITALS — BP 136/78 | HR 67 | Ht 65.0 in | Wt 221.1 lb

## 2023-02-20 DIAGNOSIS — I10 Essential (primary) hypertension: Secondary | ICD-10-CM

## 2023-02-20 DIAGNOSIS — E7849 Other hyperlipidemia: Secondary | ICD-10-CM | POA: Diagnosis not present

## 2023-02-20 DIAGNOSIS — R7303 Prediabetes: Secondary | ICD-10-CM | POA: Diagnosis not present

## 2023-02-20 DIAGNOSIS — K219 Gastro-esophageal reflux disease without esophagitis: Secondary | ICD-10-CM

## 2023-02-20 MED ORDER — PANTOPRAZOLE SODIUM 40 MG PO TBEC: 40.0000 mg | DELAYED_RELEASE_TABLET | Freq: Every day | ORAL | 3 refills | Status: DC

## 2023-02-20 MED ORDER — BLOOD GLUCOSE TEST VI STRP: 1.0000 | ORAL_STRIP | Freq: Three times a day (TID) | 0 refills | Status: AC

## 2023-02-20 MED ORDER — LANCET DEVICE MISC: 1.0000 | Freq: Three times a day (TID) | 0 refills | Status: AC

## 2023-02-20 MED ORDER — ROSUVASTATIN CALCIUM 10 MG PO TABS: 10.0000 mg | ORAL_TABLET | Freq: Every day | ORAL | 1 refills | Status: DC

## 2023-02-20 MED ORDER — OLMESARTAN MEDOXOMIL 20 MG PO TABS
20.0000 mg | ORAL_TABLET | Freq: Every day | ORAL | 1 refills | Status: DC
Start: 2023-02-20 — End: 2023-09-20

## 2023-02-20 MED ORDER — LANCETS MISC. MISC: 1.0000 | Freq: Three times a day (TID) | 0 refills | Status: AC

## 2023-02-20 MED ORDER — BLOOD GLUCOSE MONITORING SUPPL DEVI: 1.0000 | Freq: Three times a day (TID) | 0 refills | Status: DC

## 2023-02-20 NOTE — Assessment & Plan Note (Addendum)
The patient reports occasional lightheadedness and mentions that she currently eats about one meal daily, working with a nutritionist to improve her diet. She was encouraged to check her blood sugar at least once daily, particularly when experiencing symptoms of lightheadedness or dizziness. A glucose kit and meter will be provided to monitor her blood sugar levels. She was advised to change positions slowly if she feels lightheaded, increase her fluid intake to at least 64 ounces daily, and follow up if she experiences chest pain, syncope, or shortness of breath associated with her lightheadedness.

## 2023-02-20 NOTE — Progress Notes (Signed)
Established Patient Office Visit  Subjective:  Patient ID: Hailey Ross, female    DOB: 1971-01-19  Age: 52 y.o. MRN: [id number]  CC:  Chief Complaint  Patient presents with   Care Management    1 month f/u, pt reports pressure in head at times, feels fatigued    HPI Hailey Ross is a 52 y.o. female presents for  hypertension f/u of  chronic medical conditions. For the details of today's visit, please refer to the assessment and plan.       Past Medical History:  Diagnosis Date   Acute cholecystitis 08/24/2016   Acute kidney injury superimposed on chronic kidney disease (HCC) 06/20/2019   Acute respiratory failure with hypoxia (HCC) 06/19/2019   Annual visit for general adult medical examination with abnormal findings 04/16/2019   Arthritis    right knee   Bronchiectasis (HCC)    CKD (chronic kidney disease)    Iron deficiency    Lightheadedness 05/10/2019   PE (pulmonary thromboembolism) (HCC) 06/2019   Pre-syncope 04/16/2019   Sarcoidosis    Sarcoidosis    Sleep apnea    c pap   Smoker 02/12/2014    Past Surgical History:  Procedure Laterality Date   CHOLECYSTECTOMY N/A 08/25/2016   Procedure: LAPAROSCOPIC CHOLECYSTECTOMY;  Surgeon: Abigail Miyamoto, MD;  Location: [facility] OR;  Service: General;  Laterality: N/A;   TUBAL LIGATION  02/1993    Family History  Problem Relation Age of Onset   Hypertension Mother    Cancer Mother    Colon polyps Mother    Healthy Sister    Healthy Sister    Healthy Sister    Healthy Sister    Healthy Son    Healthy Son    Healthy Daughter    Colon cancer Neg Hx    Esophageal cancer Neg Hx    Rectal cancer Neg Hx    Stomach cancer Neg Hx     Social History   Socioeconomic History   Marital status: Married    Spouse name: Not on file   Number of children: 3   Years of education: Not on file   Highest education level: High school graduate  Occupational History   Occupation: Administrator, [employer] ([employer])  Tobacco Use    Smoking status: Former    Current packs/day: 0.00    Average packs/day: 1 pack/day for 20.0 years (20.0 ttl pk-yrs)    Types: Cigarettes    Start date: 03/21/1995    Quit date: 03/21/2015    Years since quitting: 7.9    Passive exposure: Past   Smokeless tobacco: Never  Vaping Use   Vaping status: Never Used  Substance and Sexual Activity   Alcohol use: Not Currently    Comment: Rare   Drug use: Never   Sexual activity: Yes    Birth control/protection: Surgical  Other Topics Concern   Not on file  Social History Narrative   Live with partners [name]-19 years   3 children.    1-[name] (girl) 27 at home with her: 2 grandchildren in her home as well    [name]- 26 expecting   [name]-25      Enjoy: read, play games on tablet, grandkids      Diet: Veggies, does not eat a lot of fried foods, enjoys chicken and fruit   Caffeine: sodas and coffee (with sugar)   Water: Does not drink a lot at all      Wears seat beat   Does not wear  sunscreen   Smoke and carbon monoxide detectors   Does not use phone while driving    Social Determinants of Health   Financial Resource Strain: Low Risk  (10/10/2018)   Overall Financial Resource Strain (CARDIA)    Difficulty of Paying Living Expenses: Not hard at all  Food Insecurity: No Food Insecurity (12/06/2022)   Hunger Vital Sign    Worried About Running Out of Food in the Last Year: Never true    Ran Out of Food in the Last Year: Never true  Transportation Needs: No Transportation Needs (12/06/2022)   PRAPARE - Administrator, Civil Service (Medical): No    Lack of Transportation (Non-Medical): No  Physical Activity: Sufficiently Active (10/10/2018)   Exercise Vital Sign    Days of Exercise per Week: 7 days    Minutes of Exercise per Session: 60 min  Stress: No Stress Concern Present (10/10/2018)   [employer] of Occupational Health - Occupational Stress Questionnaire    Feeling of Stress : Not at all  Social Connections:  Unknown (08/24/2021)   Received from [facility], [facility]   Social Network    Social Network: Not on file  Intimate Partner Violence: Not At Risk (12/06/2022)   Humiliation, Afraid, Rape, and Kick questionnaire    Fear of Current or Ex-Partner: No    Emotionally Abused: No    Physically Abused: No    Sexually Abused: No    Outpatient Medications Prior to Visit  Medication Sig Dispense Refill   acetaminophen (TYLENOL) 500 MG tablet Take 500 mg by mouth every 4 (four) hours as needed for mild pain, moderate pain or headache.      albuterol (VENTOLIN HFA) 108 (90 Base) MCG/ACT inhaler Inhale 2 puffs into the lungs every 6 (six) hours as needed for wheezing or shortness of breath. 8 g 0   azelastine (ASTELIN) 0.1 % nasal spray Place 1 spray into both nostrils daily at 2 am. Use in each nostril as directed (Patient taking differently: Place 1 spray into both nostrils as needed. Use in each nostril as directed) 30 mL 12   Budeson-Glycopyrrol-Formoterol (BREZTRI AEROSPHERE) 160-9-4.8 MCG/ACT AERO Inhale 2 puffs into the lungs in the morning and at bedtime. 10.7 g 3   diltiazem (CARDIZEM) 30 MG tablet Take 1 tablet (30 mg total) by mouth daily as needed (For palpitations). May take an additional 30 mg tablet daily as needed for palpitations 135 tablet 1   LORazepam (ATIVAN) 0.5 MG tablet Take 0.5 mg by mouth daily as needed for anxiety.     methotrexate (RHEUMATREX) 2.5 MG tablet Take 4 tablets (10 mg total) by mouth once a week. 16 tablet 0   metoCLOPramide (REGLAN) 5 MG tablet Take 1 tablet (5 mg total) by mouth 2 (two) times daily. 60 tablet 5   mirabegron ER (MYRBETRIQ) 25 MG TB24 tablet Take 1 tablet (25 mg total) by mouth daily. 30 tablet 1   Vitamin D, Ergocalciferol, (DRISDOL) 1.25 MG (50000 UNIT) CAPS capsule Take 1 capsule (50,000 Units total) by mouth every 7 (seven) days. 20 capsule 1   XARELTO 20 MG TABS tablet TAKE 1 TABLET(20 MG) BY MOUTH DAILY WITH SUPPER 90 tablet 3    olmesartan (BENICAR) 20 MG tablet Take 1 tablet (20 mg total) by mouth daily. 30 tablet 1   pantoprazole (PROTONIX) 40 MG tablet TAKE 1 TABLET(40 MG) BY MOUTH DAILY 90 tablet 3   rosuvastatin (CRESTOR) 10 MG tablet Take 10 mg by mouth at  bedtime.     No facility-administered medications prior to visit.    No Known Allergies  ROS Review of Systems  Constitutional:  Negative for chills and fever.  Eyes:  Negative for visual disturbance.  Respiratory:  Negative for chest tightness and shortness of breath.   Neurological:  Negative for dizziness and headaches.      Objective:    Physical Exam HENT:     Head: Normocephalic.     Mouth/Throat:     Mouth: Mucous membranes are moist.  Cardiovascular:     Rate and Rhythm: Normal rate.     Heart sounds: Normal heart sounds.  Pulmonary:     Effort: Pulmonary effort is normal.     Breath sounds: Normal breath sounds.  Neurological:     Mental Status: She is alert.     BP 136/78   Pulse 67   Ht 5\' 5"  (1.651 m)   Wt 221 lb 1.9 oz (100.3 kg)   LMP 04/01/2021 (Exact Date)   SpO2 98%   BMI 36.80 kg/m  Wt Readings from Last 3 Encounters:  02/20/23 221 lb 1.9 oz (100.3 kg)  01/31/23 224 lb 3.2 oz (101.7 kg)  01/24/23 219 lb 6.4 oz (99.5 kg)    Lab Results  Component Value Date   TSH 0.494 01/17/2023   Lab Results  Component Value Date   WBC 12.0 (H) 01/17/2023   HGB 13.0 01/17/2023   HCT 41.4 01/17/2023   MCV 87 01/17/2023   PLT 374 01/17/2023   Lab Results  Component Value Date   NA 146 (H) 01/17/2023   K 4.6 01/17/2023   CO2 21 01/17/2023   GLUCOSE 102 (H) 01/17/2023   BUN 17 01/17/2023   CREATININE 1.07 (H) 01/17/2023   BILITOT 0.4 01/17/2023   ALKPHOS 95 01/17/2023   AST 21 01/17/2023   ALT 18 01/17/2023   PROT 6.7 01/17/2023   ALBUMIN 4.0 01/17/2023   CALCIUM 9.2 01/17/2023   ANIONGAP 12 12/05/2022   EGFR 62 01/17/2023   GFR 53.13 (L) 04/20/2021   Lab Results  Component Value Date   CHOL 199  01/17/2023   Lab Results  Component Value Date   HDL 93 01/17/2023   Lab Results  Component Value Date   LDLCALC 96 01/17/2023   Lab Results  Component Value Date   TRIG 56 01/17/2023   Lab Results  Component Value Date   CHOLHDL 2.1 01/17/2023   Lab Results  Component Value Date   HGBA1C 6.1 (H) 01/17/2023      Assessment & Plan:  Essential hypertension Assessment & Plan: The patient's blood pressure is well-controlled, and she is asymptomatic today in the clinic. She was encouraged to continue taking Olmesartan 20 mg daily, maintain a low-sodium diet, and increase physical activity. She reports a mild frontal headache rated at 2 out of 10 without any red flag symptoms and was advised to continue taking Tylenol as needed, increase her fluid intake to at least 64 ounces daily, and ensure adequate rest. The patient is under neurology care and was encouraged to follow up as needed. She also acknowledges minimal physical activity, which may be contributing to her fatigue, so she was encouraged to gradually increase her physical activity to 150 minutes of moderate-intensity exercise weekly. Her previous visit indicated slightly low vitamin D levels, for which she was started on a vitamin D supplement; she was reminded to continue taking the supplement and to incorporate regular exercise for optimal health.   BP  Readings from Last 3 Encounters:  02/20/23 136/78  01/31/23 116/80  01/24/23 110/80      Orders: -     Olmesartan Medoxomil; Take 1 tablet (20 mg total) by mouth daily.  Dispense: 90 tablet; Refill: 1  Prediabetes Assessment & Plan: The patient reports occasional lightheadedness and mentions that she currently eats about one meal daily, working with a nutritionist to improve her diet. She was encouraged to check her blood sugar at least once daily, particularly when experiencing symptoms of lightheadedness or dizziness. A glucose kit and meter will be provided to monitor  her blood sugar levels. She was advised to change positions slowly if she feels lightheaded, increase her fluid intake to at least 64 ounces daily, and follow up if she experiences chest pain, syncope, or shortness of breath associated with her lightheadedness.     Orders: -     Blood Glucose Monitoring Suppl; 1 each by Does not apply route in the morning, at noon, and at bedtime. May substitute to any manufacturer covered by patient's insurance.  Dispense: 1 each; Refill: 0 -     Blood Glucose Test; 1 each by In Vitro route in the morning, at noon, and at bedtime. May substitute to any manufacturer covered by patient's insurance.  Dispense: 100 strip; Refill: 0 -     Lancet Device; 1 each by Does not apply route in the morning, at noon, and at bedtime. May substitute to any manufacturer covered by patient's insurance.  Dispense: 1 each; Refill: 0 -     Lancets Misc.; 1 each by Does not apply route in the morning, at noon, and at bedtime. May substitute to any manufacturer covered by patient's insurance.  Dispense: 100 each; Refill: 0  Gastroesophageal reflux disease without esophagitis Assessment & Plan: Refills sent GERD diet encouraged  Orders: -     Pantoprazole Sodium; Take 1 tablet (40 mg total) by mouth daily.  Dispense: 90 tablet; Refill: 3  Other hyperlipidemia -     Rosuvastatin Calcium; Take 1 tablet (10 mg total) by mouth at bedtime.  Dispense: 90 tablet; Refill: 1  Note: This chart has been completed using Engineer, civil (consulting) software, and while attempts have been made to ensure accuracy, certain words and phrases may not be transcribed as intended.    Follow-up: Return in about 3 months (around 05/23/2023).   Gilmore Laroche, FNP

## 2023-02-20 NOTE — Assessment & Plan Note (Signed)
Refills sent GERD diet encouraged

## 2023-02-20 NOTE — Assessment & Plan Note (Addendum)
The patient's blood pressure is well-controlled, and she is asymptomatic today in the clinic. She was encouraged to continue taking Olmesartan 20 mg daily, maintain a low-sodium diet, and increase physical activity. She reports a mild frontal headache rated at 2 out of 10 without any red flag symptoms and was advised to continue taking Tylenol as needed, increase her fluid intake to at least 64 ounces daily, and ensure adequate rest. The patient is under neurology care and was encouraged to follow up as needed. She also acknowledges minimal physical activity, which may be contributing to her fatigue, so she was encouraged to gradually increase her physical activity to 150 minutes of moderate-intensity exercise weekly. Her previous visit indicated slightly low vitamin D levels, for which she was started on a vitamin D supplement; she was reminded to continue taking the supplement and to incorporate regular exercise for optimal health.   BP Readings from Last 3 Encounters:  02/20/23 136/78  01/31/23 116/80  01/24/23 110/80

## 2023-02-20 NOTE — Patient Instructions (Addendum)
I appreciate the opportunity to provide care to you today!  Follow up:  3 months   Please seek immediate medical attention at the Emergency Department if you experience any of the following symptoms in conjunction with your headaches:  Acute onset of a severe headache described as "the worst headache of my life," particularly in a patient with no prior history of headaches. Unrelenting headaches that do not improve with conservative treatments or pain that progressively worsens. Lancinating or "ice-pick" pain. Severe headaches accompanied by a stiff neck and/or fever. Headaches associated with changes in mental status or level of consciousness. Persistent headaches following trauma to the head or neck. A significant change in pattern or severity of headaches in a patient with a longstanding history of chronic headaches. Your prompt evaluation is crucial for appropriate diagnosis and management.     Attached with your AVS, you will find valuable resources for self-education. I highly recommend dedicating some time to thoroughly examine them.   Please continue to a heart-healthy diet and increase your physical activities. Try to exercise for at least five days a week.    It was a pleasure to see you and I look forward to continuing to work together on your health and well-being. Please do not hesitate to call the office if you need care or have questions about your care.  In case of emergency, please visit the Emergency Department for urgent care, or contact our clinic at [phone] to schedule an appointment. We're here to help you!   Have a wonderful day and week. With Gratitude, Gilmore Laroche MSN, FNP-BC

## 2023-03-19 DIAGNOSIS — G4733 Obstructive sleep apnea (adult) (pediatric): Secondary | ICD-10-CM | POA: Diagnosis not present

## 2023-03-22 ENCOUNTER — Telehealth: Payer: Self-pay | Admitting: Adult Health

## 2023-03-22 NOTE — Telephone Encounter (Signed)
Patient Hailey Ross dropped off DMV documentation for [employer].

## 2023-03-27 NOTE — Telephone Encounter (Signed)
Form placed in Tammy's review folder.

## 2023-03-30 ENCOUNTER — Encounter: Payer: Self-pay | Admitting: *Deleted

## 2023-03-30 ENCOUNTER — Ambulatory Visit: Payer: BC Managed Care – PPO | Admitting: Nurse Practitioner

## 2023-03-30 NOTE — Telephone Encounter (Signed)
Form completed and signed by Tammy.  LVM for patient to see if she wanted the [facility] form mailed to her or if she wants to come by the office to pick it office.  Will await return call.

## 2023-04-17 DIAGNOSIS — G4733 Obstructive sleep apnea (adult) (pediatric): Secondary | ICD-10-CM | POA: Diagnosis not present

## 2023-04-22 DIAGNOSIS — G4733 Obstructive sleep apnea (adult) (pediatric): Secondary | ICD-10-CM | POA: Diagnosis not present

## 2023-04-24 ENCOUNTER — Ambulatory Visit: Payer: BC Managed Care – PPO | Admitting: Internal Medicine

## 2023-04-24 ENCOUNTER — Encounter: Payer: Self-pay | Admitting: Internal Medicine

## 2023-04-24 VITALS — BP 115/78 | HR 65 | Wt 226.0 lb

## 2023-04-24 DIAGNOSIS — J209 Acute bronchitis, unspecified: Secondary | ICD-10-CM | POA: Diagnosis not present

## 2023-04-24 DIAGNOSIS — D860 Sarcoidosis of lung: Secondary | ICD-10-CM | POA: Diagnosis not present

## 2023-04-24 DIAGNOSIS — J8410 Pulmonary fibrosis, unspecified: Secondary | ICD-10-CM | POA: Diagnosis not present

## 2023-04-24 DIAGNOSIS — Z86711 Personal history of pulmonary embolism: Secondary | ICD-10-CM | POA: Diagnosis not present

## 2023-04-24 DIAGNOSIS — Z79899 Other long term (current) drug therapy: Secondary | ICD-10-CM

## 2023-04-24 DIAGNOSIS — Z5181 Encounter for therapeutic drug level monitoring: Secondary | ICD-10-CM

## 2023-04-24 MED ORDER — PREDNISONE 10 MG PO TABS: ORAL_TABLET | ORAL | 0 refills | Status: DC

## 2023-04-24 MED ORDER — AZITHROMYCIN 250 MG PO TABS: ORAL_TABLET | ORAL | 0 refills | Status: AC

## 2023-04-24 NOTE — Patient Instructions (Addendum)
 ICD-10-CM   1. Acute bronchitis, unspecified organism  J20.9     2. 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 Pulmonary fibrosis (HCC)  J84.10     4. History of pulmonary embolus (PE)  Z86.711     5. High risk medication use  Z79.899     6. Encounter for therapeutic drug monitoring  Z51.81       Acute bronchitis, unspecified organism  Plan  - Zpak  = Please take prednisone  40 mg x1 day, then 30 mg x1 day, then 20 mg x1 day, then 10 mg x1 day, and then 5 mg x1 day and stop   Sarcoidosis of lung (HCC) High risk medication use Encounter for therapeutic drug monitoring Pulmonary fibrosis (HCC)  - might be slowly progressive  Plan'  - continue breztri , methotrexate  , folic acid  as before  - check LFT dermatitis = do spirometyr and dlco in 3 months -  -If progressive entertain antifibrotic's - take ILD questionnaire with your and bring it back with you  History of pulmonary embolus (PE)  Plan  - continue life long xarelto    Hx  of OSA  Plan  - Wear CPAP At bedtime    Follwup  - 30 min   With Dr R in 3 months but after spiro and dlco

## 2023-04-24 NOTE — Progress Notes (Signed)
 ient ID: Hailey Ross, female    DOB: 1970-10-26, 53 y.o.   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 Weaned off Steroids 2018   TEST/EVENTS :  Labs 04/21/15 >> HIV negative, Quantiferon gold negative, ACE 195, ANA, RF negative, ESR 32     Chest imaging:  CT chest 03/18/15 >> biapical thickening with traction BTX, BTX RML and lingula, subcarinal LAN 1.7 cm, patchy increased interstitial markings   HRCT chest 03/07/18 >> borderline LAN, patchy GGO, septal thickening, architectural distortion, cylindrical and varicose BTX    CT chest June 18, 2019 showed peripheral segmental and subsegmental bilateral pulmonary emboli, bulky mediastinal and hilar lymph nodes, bilateral apical and lingular honeycombing and bilateral upper lobe mild traction bronchiectasis.  Nonspecific groundglass opacities in the left base and right middle lobe.  Not significantly changed   CT chest Aug 13, 2021 right hilar and mediastinal adenopathy similar, biapical subpleural fibrosis with honeycombing and traction bronchiectasis cluster of groundglass density in the right lower lobe, negative for PE   High-resolution CT chest in September 15, 2022 showed patchy areas of groundglass attenuation, septal thickening, subpleural reticulation and bronchiectasis with scattered areas of honeycombing mid to upper lung predominance with more advanced fibrotic changes mild  progression since May 2023   Venous Dopplers were negative for DVT.     2D echo showed a preserved EF.  Mildly reduced right ventricular systolic function and moderately elevated pulmonary artery systolic pressure at 57 mmHg.  Moderate grade 3 protruding plaque involving the distal aortic arch and proximal descending aorta   Echo 09/2019    Home sleep study was done July 22, 2019.  This showed mild sleep apnea with an AHI at 9.5/hour and SPO2 low at 74%  PFT  PFT 05/26/15 >> FEV1 1.67 (66%), FEV1% 88, TLC 2/78 (53%), DLCO 36%   09/26/2019-pulmonary function test-FEV1 54%, ratio 91, FVC 47%, DLCO 45%   PFTs done on June 20, 2022 showed decreased lung function with FEV1 at 36%, ratio 83, FVC 34%, no significant bronchodilator response, total lung capacity 62%, DLCO 32%.   01/24/2023 Follow up : Fibrotic sarcoidosis, bronchiectasis, sleep apnea Patient presents for 1 month follow-up.  Patient is followed for chronic fibrotic pulmonary sarcoidosis.  Patient has had clinical decline over last year decreased activity tolerance,  decreased lung function on PFTs and progression of fibrotic changes on CT chest.  Patient was started on methotrexate  October 18, 2022.  Unfortunately patient stopped medication and was off for couple weeks.  She was restarted back on methotrexate  December 13, 2022. Last visit was having a slow to resolved asthmatic bronchitis after admission in August.  Was recommended to finish up a course of antibiotics.  And then restart her methotrexate .  Patient was restarted on prednisone  last visit 20 mg daily for 1 week with a slow taper by 5 mg each week and then stop.  She is now off steroids. Patient says she remains on methotrexate  2.5 mg 4 tablets weekly on Mondays   She is  also taking folic acid .  Labs last week showed stable kidney function and LFTs. No flare of cough or wheezing . No hemoptysis . Flu shot is utd.  Not that active, tries to walk some.   She has underlying sleep  apnea.  She remains on CPAP at bedtime.  Says she tries to wear her CPAP each night . CPAP download was requested. Using the nasal mask.   Patient has a history of unprovoked PE with a positive lupus anticoagulant.  She remains on lifelong Xarelto .  Endorses compliance.  Applying for disability, unable to work due to medical issues.   OV 04/24/2023 -transfer of care from Dr. Shellia who has left the practice to Dr. Geronimo in the ILD center.  She has sarcoidosis with fibrotic changes.  Subjective:  Patient ID: Hailey Ross, female , DOB: 1971-03-31 , age 53 y.o. , MRN: [id number] , ADDRESS: [id number] [place] [address] PCP Zarwolo, Gloria, FNP Patient Care Team: Zarwolo, Gloria, FNP as PCP - General (Family Medicine) Alvan, Dorn FALCON, MD as PCP - Cardiology (Cardiology) Rogers Hai, MD as Consulting Physician (Hematology) Shaaron Lamar HERO, MD as Consulting Physician (Gastroenterology)  This Provider for this visit: Treatment Team:  Attending Provider: Geronimo Amel, MD    04/24/2023 -   Chief Complaint  Patient presents with   Consult    Denies any concerns. Use to see Dr.sood, states breathing has been fine. Using breztri  inhaler      HPI CHONTEL WARNING 53 y.o. 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  Sleep apnea on CPAP.  She has a history of sarcoidosis.  Along with fibrotic changes she says she has been following with Dr. Ingrid in 2017.  In 2024 she said her PFTs were declining.  She does have shortness of breath on exertion relieved by rest particularly for cleaning the house or doing any vacuuming.  While climbing up a hill.  This been going on for a long time and is chronic.  She came in for this chronic visit and ILD evaluation but she reported acute problems of having worsening cough for the last [redacted] weeks along with chest tightness along with sputum that is yellow-green and is also  increased shortness of breath.  Her husband and her grandkids have been sick with respiratory infection in this winter season as recently as last week and she has been exposed to them.  She is on methotrexate  for her sarcoid.  Her last CT scan of the chest was in August 2024.    PFT     Latest Ref Rng & Units 06/20/2022   11:05 AM 09/26/2019    2:56 PM 05/26/2015    2:59 PM  ILD indicators  FVC-Pre L 1.25  P 1.25  1.78   FVC-Predicted Pre % 34  P 34  57   FVC-Post L 1.16  P 1.16  1.88   FVC-Predicted Post % 32  P 32  60   TLC L 3.23  P 3.23  2.78   TLC Predicted % 62  P 62  53   DLCO uncorrected ml/min/mmHg 6.93  P 6.93  9.41   DLCO UNC %Pred % 32  P 32  36   DLCO Corrected ml/min/mmHg 6.93  P 6.93  9.38   DLCO COR %Pred % 32  P 32  36     P Preliminary result     CT Chest August 2024 Narrative &  Impression  CLINICAL DATA:  Productive cough for 3 days, initial encounter   EXAM: CT ANGIOGRAPHY CHEST WITH CONTRAST   TECHNIQUE: Multidetector CT imaging of the chest was performed using the standard protocol during bolus administration of intravenous contrast. Multiplanar CT image reconstructions and MIPs were obtained to evaluate the vascular anatomy.   RADIATION DOSE REDUCTION: This exam was performed according to the departmental dose-optimization program which includes automated exposure control, adjustment of the mA and/or kV according to patient size and/or use of iterative reconstruction technique.   CONTRAST:  75mL OMNIPAQUE  IOHEXOL  350 MG/ML SOLN   COMPARISON:  None Available.   FINDINGS: Cardiovascular: Ascending thoracic aorta is measuring up to 3.9 cm. Normal tapering in the aortic arch is noted. No dissection is seen. No cardiac enlargement is noted. The pulmonary artery shows a normal branching pattern bilaterally. No filling defect to suggest pulmonary embolism is noted.   Mediastinum/Nodes: Thoracic inlet is within normal limits. Scattered lymph nodes  are noted through the hila and mediastinum. Right hilar index node measures up to 16 mm in short axis. AP window node measures 14 mm in short axis. Prevascular nodes measure up to 9 mm in short axis. These are new from previous exams. The esophagus as visualized is within normal limits.   Lungs/Pleura: Diffuse honeycombing is noted in the upper lobes bilaterally. Scattered fibrotic changes are seen similar to that noted on the prior exam. No acute infiltrate or sizable effusion is noted.   Upper Abdomen: Visualized upper abdomen shows no acute abnormality   Musculoskeletal: No chest wall abnormality. No acute or significant osseous findings.   Review of the MIP images confirms the above findings.   IMPRESSION: Chronic changes lungs consistent with the given clinical history of sarcoidosis. The overall appearance is stable from June of 2024.   No evidence to suggest pulmonary embolism.   Prominence of the ascending aorta 3.9 cm.   Scattered hilar and mediastinal nodes likely related to the patient's known history of sarcoidosis.     Electronically Signed   By: Oneil Devonshire M.D.   On: 12/05/2022 23:11       has a past medical history of Acute cholecystitis (08/24/2016), Acute kidney injury superimposed on chronic kidney disease (HCC) (06/20/2019), Acute respiratory failure with hypoxia (HCC) (06/19/2019), Annual visit for general adult medical examination with abnormal findings (04/16/2019), Arthritis, Bronchiectasis (HCC), CKD (chronic kidney disease), Iron deficiency, Lightheadedness (05/10/2019), PE (pulmonary thromboembolism) (HCC) (06/2019), Pre-syncope (04/16/2019), Sarcoidosis, Sarcoidosis, Sleep apnea, and Smoker (02/12/2014).   reports that she quit smoking about 8 years ago. Her smoking use included cigarettes. She started smoking about 28 years ago. She has a 20 pack-year smoking history. She has been exposed to tobacco smoke. She has never used smokeless tobacco.  Past  Surgical History:  Procedure Laterality Date   CHOLECYSTECTOMY N/A 08/25/2016   Procedure: LAPAROSCOPIC CHOLECYSTECTOMY;  Surgeon: Vernetta Berg, MD;  Location: [facility] OR;  Service: General;  Laterality: N/A;   TUBAL LIGATION  02/1993    No Known Allergies  Immunization History  Administered Date(s) Administered   Influenza Inj Mdck Quad Pf 12/25/2019   Influenza,inj,Quad PF,6+ Mos 12/21/2018, 02/22/2021, 12/17/2021   Influenza,inj,Quad PF,6-35 Mos 01/10/2019   Influenza-Unspecified 12/20/2022   PFIZER(Purple Top)SARS-COV-2 Vaccination 08/14/2019, 09/10/2019, 02/07/2020   Pneumococcal Polysaccharide-23 02/12/2014   Tdap 02/12/2014   Zoster Recombinant(Shingrix ) 12/17/2021, 10/18/2022    Family History  Problem Relation Age of Onset   Hypertension Mother    Cancer Mother    Colon polyps Mother  Healthy Sister    Healthy Sister    Healthy Sister    Healthy Sister    Healthy Son    Healthy Son    Healthy Daughter    Colon cancer Neg Hx    Esophageal cancer Neg Hx    Rectal cancer Neg Hx    Stomach cancer Neg Hx      Current Outpatient Medications:    acetaminophen  (TYLENOL ) 500 MG tablet, Take 500 mg by mouth every 4 (four) hours as needed for mild pain, moderate pain or headache. , Disp: , Rfl:    albuterol  (VENTOLIN  HFA) 108 (90 Base) MCG/ACT inhaler, Inhale 2 puffs into the lungs every 6 (six) hours as needed for wheezing or shortness of breath., Disp: 8 g, Rfl: 0   azelastine  (ASTELIN ) 0.1 % nasal spray, Place 1 spray into both nostrils daily at 2 am. Use in each nostril as directed (Patient taking differently: Place 1 spray into both nostrils as needed. Use in each nostril as directed), Disp: 30 mL, Rfl: 12   azithromycin  (ZITHROMAX ) 250 MG tablet, Take 2 tablets (500 mg total) by mouth daily for 1 day, THEN 1 tablet (250 mg total) daily for 1 day, THEN 1 tablet (250 mg total) daily for 1 day, THEN 1 tablet (250 mg total) daily for 1 day, THEN 1 tablet (250 mg total)  daily for 1 day., Disp: 6 tablet, Rfl: 0   Budeson-Glycopyrrol-Formoterol  (BREZTRI  AEROSPHERE) 160-9-4.8 MCG/ACT AERO, Inhale 2 puffs into the lungs in the morning and at bedtime., Disp: 10.7 g, Rfl: 3   diltiazem  (CARDIZEM ) 30 MG tablet, Take 1 tablet (30 mg total) by mouth daily as needed (For palpitations). May take an additional 30 mg tablet daily as needed for palpitations, Disp: 135 tablet, Rfl: 1   LORazepam (ATIVAN) 0.5 MG tablet, Take 0.5 mg by mouth daily as needed for anxiety., Disp: , Rfl:    methotrexate  (RHEUMATREX) 2.5 MG tablet, Take 4 tablets (10 mg total) by mouth once a week., Disp: 16 tablet, Rfl: 0   metoCLOPramide  (REGLAN ) 5 MG tablet, Take 1 tablet (5 mg total) by mouth 2 (two) times daily., Disp: 60 tablet, Rfl: 5   mirabegron  ER (MYRBETRIQ ) 25 MG TB24 tablet, Take 1 tablet (25 mg total) by mouth daily., Disp: 30 tablet, Rfl: 1   olmesartan  (BENICAR ) 20 MG tablet, Take 1 tablet (20 mg total) by mouth daily., Disp: 90 tablet, Rfl: 1   pantoprazole  (PROTONIX ) 40 MG tablet, Take 1 tablet (40 mg total) by mouth daily., Disp: 90 tablet, Rfl: 3   predniSONE  (DELTASONE ) 10 MG tablet, Take 4 tabs by mouth once daily x4 days, then 3 tabs x4 days, 2 tabs x4 days, 1 tab x4 days and stop., Disp: 40 tablet, Rfl: 0   rosuvastatin  (CRESTOR ) 10 MG tablet, Take 1 tablet (10 mg total) by mouth at bedtime., Disp: 90 tablet, Rfl: 1   Vitamin D , Ergocalciferol , (DRISDOL ) 1.25 MG (50000 UNIT) CAPS capsule, Take 1 capsule (50,000 Units total) by mouth every 7 (seven) days., Disp: 20 capsule, Rfl: 1   XARELTO  20 MG TABS tablet, TAKE 1 TABLET(20 MG) BY MOUTH DAILY WITH SUPPER, Disp: 90 tablet, Rfl: 3   Blood Glucose Monitoring Suppl DEVI, 1 each by Does not apply route in the morning, at noon, and at bedtime. May substitute to any manufacturer covered by patient's insurance., Disp: 1 each, Rfl: 0      Objective:   Vitals:   04/24/23 1354  BP: 115/78  Pulse:  65  SpO2: 93%  Weight: 226 lb (102.5  kg)    Estimated body mass index is 37.61 kg/m as calculated from the following:   Height as of 02/20/23: 5' 5 (1.651 m).   Weight as of this encounter: 226 lb (102.5 kg).  @WEIGHTCHANGE @  Filed Weights   04/24/23 1354  Weight: 226 lb (102.5 kg)     Physical Exam   General: No distress. Looks well O2 at rest: no Cane present: no Sitting in wheel chair: no Frail: no Obese: YES Neuro: Alert and Oriented x 3. GCS 15. Speech normal Psych: Pleasant Resp:  Barrel Chest - no.  Wheeze - no, Crackles - no, No overt respiratory distress CVS: Normal heart sounds. Murmurs - no Ext: Stigmata of Connective Tissue Disease - no HEENT: Normal upper airway. PEERL +. No post nasal drip        Assessment:       ICD-10-CM   1. Acute bronchitis, unspecified organism  J20.9     2. 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 Pulmonary fibrosis (HCC)  J84.10 Pulmonary function test    4. History of pulmonary embolus (PE)  Z86.711     5. High risk medication use  Z79.899     6. Encounter for therapeutic drug monitoring  Z51.81 Pulmonary function test    Hepatic function panel         Plan:     Patient Instructions     ICD-10-CM   1. Acute bronchitis, unspecified organism  J20.9     2. 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 Pulmonary fibrosis (HCC)  J84.10     4. History of pulmonary embolus (PE)  Z86.711     5. High risk medication use  Z79.899     6. Encounter for therapeutic drug monitoring  Z51.81       Acute bronchitis, unspecified organism  Plan  - Zpak  = Please take prednisone  40 mg x1 day, then 30 mg x1 day, then 20 mg x1 day, then 10 mg x1 day, and then 5 mg x1 day and stop   Sarcoidosis of lung (HCC) High risk medication use Encounter for therapeutic drug monitoring Pulmonary fibrosis (HCC)  - might be slowly progressive  Plan'  - continue breztri , methotrexate  , folic acid  as before  - check LFT dermatitis = do spirometyr and dlco in 3  months -  -If progressive entertain antifibrotic's - take ILD questionnaire with your and bring it back with you  History of pulmonary embolus (PE)  Plan  - continue life long xarelto    Hx  of OSA  Plan  - Wear CPAP At bedtime    Follwup  - 30 min   With Dr R in 3 months but after spiro and dlco    FOLLOWUP Return in about 3 months (around 07/23/2023) for 30 min visit, after Spiro and DLCO, with Dr Geronimo, Face to Face Visit, ILD, Sarcoidosis.    SIGNATURE    Dr. Dorethia Geronimo, M.D., F.C.C.P,  Pulmonary and Critical Care Medicine Staff Physician, [facility] Director - Interstitial Lung Disease  Program  Pulmonary Fibrosis [facility] at [facility] [place], [place], [address]  Pager: [phone], If no answer or between  15:00h - 7:00h: call [phone] Telephone: [phone]  5:59 PM 04/24/2023

## 2023-04-25 ENCOUNTER — Encounter: Payer: Self-pay | Admitting: Gastroenterology

## 2023-04-25 ENCOUNTER — Other Ambulatory Visit (INDEPENDENT_AMBULATORY_CARE_PROVIDER_SITE_OTHER): Payer: BC Managed Care – PPO

## 2023-04-25 ENCOUNTER — Ambulatory Visit: Payer: BC Managed Care – PPO | Admitting: Gastroenterology

## 2023-04-25 VITALS — BP 130/88 | HR 75 | Ht 65.0 in | Wt 224.0 lb

## 2023-04-25 DIAGNOSIS — K5900 Constipation, unspecified: Secondary | ICD-10-CM

## 2023-04-25 DIAGNOSIS — K3184 Gastroparesis: Secondary | ICD-10-CM

## 2023-04-25 DIAGNOSIS — Z5181 Encounter for therapeutic drug level monitoring: Secondary | ICD-10-CM

## 2023-04-25 LAB — HEPATIC FUNCTION PANEL
ALT: 23 U/L (ref 0–35)
AST: 23 U/L (ref 0–37)
Albumin: 4.1 g/dL (ref 3.5–5.2)
Alkaline Phosphatase: 98 U/L (ref 39–117)
Bilirubin, Direct: 0 mg/dL (ref 0.0–0.3)
Total Bilirubin: 0.4 mg/dL (ref 0.2–1.2)
Total Protein: 7.4 g/dL (ref 6.0–8.3)

## 2023-04-25 NOTE — Progress Notes (Signed)
 Agree with assessment and plan

## 2023-04-25 NOTE — Progress Notes (Signed)
 Chief Complaint: alternating bowel habits Primary GI MD: Dr. Abran  HPI: 53 year old female history of pulmonary embolism (2021) on Xarelto , sarcoidosis, gastroparesis, sleep apnea, and others as listed below presents for evaluation of alternating bowel habits.  Last seen 11/29/2022 by Harlene Mail PA-C (well known to [facility] and patient of Dr. Abran)  PREVIOUS HISTORY Gastric emptying scan was abnormal with findings compatible with delayed gastric emptying with 47% emptying at 4 hours (normal greater than 90%) Patient was started on Reglan  5 Mg twice daily March 2024 with no change in symptoms.  Symptoms are typically characterized by intermittent upper abdominal pain.  No nausea/vomiting.  Seen in ED June 2024 for rectal bleeding and abdominal pain with CT scan showing colitis.  The symptoms had resolved and she was having some fecal leakage at last appointment for which she was recommended to do Benefiber twice daily  ---------------------TODAY-----------------------------  Discussed the use of AI scribe software for clinical note transcription with the patient, who gave verbal consent to proceed.  The patient, with a history of gastroparesis, presents with concerns about fluctuating bowel habits. She reports a period of constipation last year, which resolved but has since recurred. In December, she experienced a few days of loose stools, followed by a sudden onset of constipation. Despite taking Miralax and a stool softener, the constipation persisted, causing significant discomfort and lower back pain. The patient also reports episodes of nausea during bowel movements, and on one occasion, vomiting.  The patient's symptoms have caused her significant distress as she has googled her symptoms and is concerned about her pancreas. However, since starting a stool softener, the patient reports an improvement in her bowel habits. She has not experienced any issues with the stool softener thus  far.  The patient's bowel habits seem to fluctuate depending on her diet, hydration, and stress levels. The patient's mother had pancreatic issues, which has heightened her concern about her own health. However, recent investigations, including a colonoscopy, endoscopy, and CT scan, have not revealed any significant abnormalities.      PREVIOUS GI WORKUP   EGD 09/2019: 1. Nonspecific gastric erythema. 2. GERD Negative Hpylori.   Colonoscopy 09/2019 was normal.  Past Medical History:  Diagnosis Date   Acute cholecystitis 08/24/2016   Acute kidney injury superimposed on chronic kidney disease (HCC) 06/20/2019   Acute respiratory failure with hypoxia (HCC) 06/19/2019   Annual visit for general adult medical examination with abnormal findings 04/16/2019   Arthritis    right knee   Bronchiectasis (HCC)    CKD (chronic kidney disease)    Iron deficiency    Lightheadedness 05/10/2019   PE (pulmonary thromboembolism) (HCC) 06/2019   Pre-syncope 04/16/2019   Sarcoidosis    Sarcoidosis    Sleep apnea    c pap   Smoker 02/12/2014    Past Surgical History:  Procedure Laterality Date   CHOLECYSTECTOMY N/A 08/25/2016   Procedure: LAPAROSCOPIC CHOLECYSTECTOMY;  Surgeon: Vernetta Berg, MD;  Location: [facility] OR;  Service: General;  Laterality: N/A;   TUBAL LIGATION  02/1993    Current Outpatient Medications  Medication Sig Dispense Refill   acetaminophen  (TYLENOL ) 500 MG tablet Take 500 mg by mouth every 4 (four) hours as needed for mild pain, moderate pain or headache.      albuterol  (VENTOLIN  HFA) 108 (90 Base) MCG/ACT inhaler Inhale 2 puffs into the lungs every 6 (six) hours as needed for wheezing or shortness of breath. 8 g 0   azelastine  (ASTELIN ) 0.1 % nasal spray  Place 1 spray into both nostrils daily at 2 am. Use in each nostril as directed (Patient taking differently: Place 1 spray into both nostrils as needed. Use in each nostril as directed) 30 mL 12   azithromycin  (ZITHROMAX ) 250 MG  tablet Take 2 tablets (500 mg total) by mouth daily for 1 day, THEN 1 tablet (250 mg total) daily for 1 day, THEN 1 tablet (250 mg total) daily for 1 day, THEN 1 tablet (250 mg total) daily for 1 day, THEN 1 tablet (250 mg total) daily for 1 day. 6 tablet 0   Budeson-Glycopyrrol-Formoterol  (BREZTRI  AEROSPHERE) 160-9-4.8 MCG/ACT AERO Inhale 2 puffs into the lungs in the morning and at bedtime. 10.7 g 3   diltiazem  (CARDIZEM ) 30 MG tablet Take 1 tablet (30 mg total) by mouth daily as needed (For palpitations). May take an additional 30 mg tablet daily as needed for palpitations 135 tablet 1   LORazepam (ATIVAN) 0.5 MG tablet Take 0.5 mg by mouth daily as needed for anxiety.     methotrexate  (RHEUMATREX) 2.5 MG tablet Take 4 tablets (10 mg total) by mouth once a week. 16 tablet 0   metoCLOPramide  (REGLAN ) 5 MG tablet Take 1 tablet (5 mg total) by mouth 2 (two) times daily. 60 tablet 5   mirabegron  ER (MYRBETRIQ ) 25 MG TB24 tablet Take 1 tablet (25 mg total) by mouth daily. 30 tablet 1   olmesartan  (BENICAR ) 20 MG tablet Take 1 tablet (20 mg total) by mouth daily. 90 tablet 1   pantoprazole  (PROTONIX ) 40 MG tablet Take 1 tablet (40 mg total) by mouth daily. 90 tablet 3   predniSONE  (DELTASONE ) 10 MG tablet Take 4 tabs by mouth once daily x4 days, then 3 tabs x4 days, 2 tabs x4 days, 1 tab x4 days and stop. 40 tablet 0   rosuvastatin  (CRESTOR ) 10 MG tablet Take 1 tablet (10 mg total) by mouth at bedtime. 90 tablet 1   Vitamin D , Ergocalciferol , (DRISDOL ) 1.25 MG (50000 UNIT) CAPS capsule Take 1 capsule (50,000 Units total) by mouth every 7 (seven) days. 20 capsule 1   XARELTO  20 MG TABS tablet TAKE 1 TABLET(20 MG) BY MOUTH DAILY WITH SUPPER 90 tablet 3   No current facility-administered medications for this visit.    Allergies as of 04/25/2023   (No Known Allergies)    Family History  Problem Relation Age of Onset   Hypertension Mother    Cancer Mother    Colon polyps Mother    Healthy Sister     Healthy Sister    Healthy Sister    Healthy Sister    Healthy Son    Healthy Son    Healthy Daughter    Colon cancer Neg Hx    Esophageal cancer Neg Hx    Rectal cancer Neg Hx    Stomach cancer Neg Hx     Social History   Socioeconomic History   Marital status: Married    Spouse name: Not on file   Number of children: 3   Years of education: Not on file   Highest education level: High school graduate  Occupational History   Occupation: administrator, 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    1-[name] (girl) 27 at home with her: 2 grandchildren in her home as well    [name]- 26 expecting   [name]-25      Enjoy: read, play games on tablet, grandkids      Diet: Veggies, does not eat a lot of fried foods, enjoys chicken and fruit   Caffeine: sodas and coffee (with sugar)   Water: Does not drink a lot at all      Wears seat beat   Does not wear sunscreen   Smoke and carbon monoxide detectors   Does not use phone while driving    Social Drivers of Corporate Investment Banker Strain: Low Risk  (10/10/2018)   Overall Financial Resource Strain (CARDIA)    Difficulty of Paying Living Expenses: Not hard at all  Food Insecurity: No Food Insecurity (12/06/2022)   Hunger Vital Sign    Worried About Running Out of Food in the Last Year: Never true    Ran Out of Food in the Last Year: Never true  Transportation  Needs: No Transportation Needs (12/06/2022)   PRAPARE - Administrator, Civil Service (Medical): No    Lack of Transportation (Non-Medical): No  Physical Activity: Sufficiently Active (10/10/2018)   Exercise Vital Sign    Days of Exercise per Week: 7 days    Minutes of Exercise per Session: 60 min  Stress: No Stress Concern Present (10/10/2018)   [employer] of Occupational Health - Occupational Stress Questionnaire    Feeling of Stress : Not at all  Social Connections: Unknown (08/24/2021)   Received from [facility], [facility]   Social Network    Social Network: Not on file  Intimate Partner Violence: Not At Risk (12/06/2022)   Humiliation, Afraid, Rape, and Kick questionnaire    Fear of Current or Ex-Partner: No    Emotionally Abused: No    Physically Abused: No    Sexually Abused: No    Review of Systems:    Constitutional: No weight loss, fever, chills, weakness or fatigue HEENT: Eyes: No change in vision               Ears, Nose, Throat:  No change in hearing or congestion Skin: No rash or itching Cardiovascular: No chest pain, chest pressure or palpitations   Respiratory: No SOB or cough Gastrointestinal: See HPI and otherwise negative Genitourinary: No dysuria or change in urinary frequency Neurological: No headache, dizziness or syncope Musculoskeletal: No new muscle or joint pain Hematologic: No bleeding or bruising Psychiatric: No history of depression or anxiety    Physical Exam:  Vital signs: BP 130/88   Pulse 75   Ht 5' 5 (1.651 m)   Wt 224 lb (101.6 kg)   LMP 04/01/2021 (Exact Date)   BMI 37.28 kg/m   Constitutional: NAD, Well developed, Well nourished, alert and cooperative Head:  Normocephalic and atraumatic. Eyes:   PEERL, EOMI. No icterus. Conjunctiva pink. Respiratory: Respirations even and unlabored. Lungs clear to auscultation bilaterally.   No wheezes, crackles, or rhonchi.  Cardiovascular:  Regular rate and rhythm. No  peripheral edema, cyanosis or pallor.  Gastrointestinal:  Soft, nondistended, nontender. No rebound or guarding. Normal bowel sounds. No appreciable masses or hepatomegaly. Rectal:  Not performed.  Msk:  Symmetrical without gross deformities. Without edema, no deformity or joint abnormality.  Neurologic:  Alert and  oriented x4;  grossly normal neurologically.  Skin:   Dry and intact without significant lesions or rashes. Psychiatric: Oriented to person, place and time. Demonstrates good judgement and reason without abnormal affect or behaviors.  RELEVANT LABS AND IMAGING: CBC    Component Value Date/Time   WBC 12.0 (H) 01/17/2023 1045   WBC 12.7 (H) 12/19/2022 0915   RBC 4.77 01/17/2023 1045   RBC 4.45 12/19/2022 0915   HGB 13.0 01/17/2023 1045   HCT 41.4 01/17/2023 1045   PLT 374 01/17/2023 1045   MCV 87 01/17/2023 1045   MCH 27.3 01/17/2023 1045   MCH 27.4 12/19/2022 0915   MCHC 31.4 (L) 01/17/2023 1045   MCHC 30.9 12/19/2022 0915   RDW 12.8 01/17/2023 1045   LYMPHSABS 3.5 (H) 01/17/2023 1045   MONOABS 0.4 12/05/2022 2159   EOSABS 0.0 01/17/2023 1045   BASOSABS 0.0 01/17/2023 1045    CMP     Component Value Date/Time   NA 146 (H) 01/17/2023 1045   K 4.6 01/17/2023 1045   CL 107 (H) 01/17/2023 1045   CO2 21 01/17/2023 1045   GLUCOSE 102 (H) 01/17/2023 1045   GLUCOSE 149 (H) 12/05/2022 2159   BUN 17 01/17/2023 1045   CREATININE 1.07 (H) 01/17/2023 1045   CREATININE 1.27 (H) 08/30/2022 1041   CALCIUM  9.2 01/17/2023 1045   PROT 6.7 01/17/2023 1045   ALBUMIN  4.0 01/17/2023 1045   AST 21 01/17/2023 1045   ALT 18 01/17/2023 1045   ALKPHOS 95 01/17/2023 1045   BILITOT 0.4 01/17/2023 1045   GFRNONAA 50 (L) 12/05/2022 2159   GFRNONAA 53 (L) 09/23/2020 1458   GFRAA 62 09/23/2020 1458     Assessment/Plan:      Gastroparesis  Well controlled on reglan  5mg  twice dail - continue reglan . Advised of possible side effect of tardive dyskinesia.  Constipation History of  alternating constipation and diarrhea, likely secondary to overflow from severe constipation. Recent improvement/resolution with stool softeners. No signs of pancreatic insufficiency. Reassuringly has had recent CT scan, EGD/colonoscopy. -Continue stool softeners regularly. -Add Miralax as needed for worsening constipation. -Follow up as needed if symptoms change or worsen.     [facility] 04/25/2023, 12:09 PM  Cc: Zarwolo, Gloria, FNP

## 2023-05-10 NOTE — Telephone Encounter (Signed)
ERROR

## 2023-05-18 DIAGNOSIS — G4733 Obstructive sleep apnea (adult) (pediatric): Secondary | ICD-10-CM | POA: Diagnosis not present

## 2023-05-26 ENCOUNTER — Ambulatory Visit: Payer: BC Managed Care – PPO | Admitting: Family Medicine

## 2023-05-26 VITALS — BP 156/91 | HR 67 | Resp 16 | Ht 65.0 in | Wt 224.0 lb

## 2023-05-26 DIAGNOSIS — L0291 Cutaneous abscess, unspecified: Secondary | ICD-10-CM | POA: Diagnosis not present

## 2023-05-26 MED ORDER — DOXYCYCLINE HYCLATE 100 MG PO TABS: 100.0000 mg | ORAL_TABLET | Freq: Two times a day (BID) | ORAL | 0 refills | Status: AC

## 2023-05-26 MED ORDER — MUPIROCIN 2 % EX OINT: 1.0000 | TOPICAL_OINTMENT | Freq: Two times a day (BID) | CUTANEOUS | 0 refills | Status: DC

## 2023-05-26 NOTE — Assessment & Plan Note (Signed)
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 Referral placed to dermatology for evaluation and possible excision of the cyst. Warm compresses may help promote drainage if no active infection is present. Will initiate therapy on doxycycline for seven days advised to avoid squeezing or manipulating the area to prevent worsening inflammation or secondary infection. Encourage hygiene measures, including using antibacterial soap and avoiding irritants like deodorants or shaving in the affected area. When to Seek Urgent Care: If the cyst becomes extremely painful, red, or swollen If fever, chills, or signs of spreading infection occur If the drainage increases or becomes foul-smelling

## 2023-05-26 NOTE — Progress Notes (Signed)
Acute Office Visit  Subjective:    Patient ID: Hailey Ross, female    DOB: [birth year], 53 y.o.   MRN: [id number]  Chief Complaint  Patient presents with   Cyst    Under right armpit x 1 month and has been draining a greenish yellow    HPI The patient presents today with complaints of a cyst in her right armpit, which has been leaking yellow purulent discharge for about a month. She reports experiencing flare-ups at least twice a year, typically lasting 1 to 2 weeks, but this episode has persisted for about a month. Initially, the cyst was larger in size, but it has since decreased to approximately the size of a penny. She states that the cyst is not tender to touch but experiences pain when it is squeezed. The patient reports having had a similar cyst lanced in the same location a few years ago in the ED and expresses interest in having it permanently removed. No fever or chills reported.   Past Medical History:  Diagnosis Date   Acute cholecystitis 08/24/2016   Acute kidney injury superimposed on chronic kidney disease (HCC) 06/20/2019   Acute respiratory failure with hypoxia (HCC) 06/19/2019   Annual visit for general adult medical examination with abnormal findings 04/16/2019   Arthritis    right knee   Bronchiectasis (HCC)    CKD (chronic kidney disease)    Iron deficiency    Lightheadedness 05/10/2019   PE (pulmonary thromboembolism) (HCC) 06/2019   Pre-syncope 04/16/2019   Sarcoidosis    Sarcoidosis    Sleep apnea    c pap   Smoker 02/12/2014    Past Surgical History:  Procedure Laterality Date   CHOLECYSTECTOMY N/A 08/25/2016   Procedure: LAPAROSCOPIC CHOLECYSTECTOMY;  Surgeon: Abigail Miyamoto, MD;  Location: [facility] OR;  Service: General;  Laterality: N/A;   TUBAL LIGATION  02/1993    Family History  Problem Relation Age of Onset   Hypertension Mother    Cancer Mother    Colon polyps Mother    Healthy Sister    Healthy Sister    Healthy Sister    Healthy Sister     Healthy Son    Healthy Son    Healthy Daughter    Colon cancer Neg Hx    Esophageal cancer Neg Hx    Rectal cancer Neg Hx    Stomach cancer Neg Hx     Social History   Socioeconomic History   Marital status: Married    Spouse name: Not on file   Number of children: 3   Years of education: Not on file   Highest education level: High school graduate  Occupational History   Occupation: Administrator, 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    1-[name] (girl) 27 at  home with her: 2 grandchildren in her home as well    [name]- 26 expecting   [name]-25      Enjoy: read, play games on tablet, grandkids      Diet: Veggies, does not eat a lot of fried foods, enjoys chicken and fruit   Caffeine: sodas and coffee (with sugar)   Water: Does not drink a lot at all      Wears seat beat   Does not wear sunscreen   Smoke and carbon monoxide detectors   Does not use phone while driving    Social Drivers of Corporate investment banker Strain: Low Risk  (10/10/2018)   Overall Financial Resource Strain (CARDIA)    Difficulty of Paying Living Expenses: Not hard at all  Food Insecurity: No Food Insecurity (12/06/2022)   Hunger Vital Sign    Worried About Running Out of Food in the Last Year: Never true    Ran Out of Food in the Last Year: Never true  Transportation Needs: No Transportation Needs (12/06/2022)   PRAPARE -  Administrator, Civil Service (Medical): No    Lack of Transportation (Non-Medical): No  Physical Activity: Sufficiently Active (10/10/2018)   Exercise Vital Sign    Days of Exercise per Week: 7 days    Minutes of Exercise per Session: 60 min  Stress: No Stress Concern Present (10/10/2018)   [employer] of Occupational Health - Occupational Stress Questionnaire    Feeling of Stress : Not at all  Social Connections: Unknown (08/24/2021)   Received from [facility], [facility]   Social Network    Social Network: Not on file  Intimate Partner Violence: Not At Risk (12/06/2022)   Humiliation, Afraid, Rape, and Kick questionnaire    Fear of Current or Ex-Partner: No    Emotionally Abused: No    Physically Abused: No    Sexually Abused: No    Outpatient Medications Prior to Visit  Medication Sig Dispense Refill   acetaminophen (TYLENOL) 500 MG tablet Take 500 mg by mouth every 4 (four) hours as needed for mild pain, moderate pain or headache.      albuterol (VENTOLIN HFA) 108 (90 Base) MCG/ACT inhaler Inhale 2 puffs into the lungs every 6 (six) hours as needed for wheezing or shortness of breath. 8 g 0   azelastine (ASTELIN) 0.1 % nasal spray Place 1 spray into both nostrils daily at 2 am. Use in each nostril as directed (Patient taking differently: Place 1 spray into both nostrils as needed. Use in each nostril as directed) 30 mL 12   Budeson-Glycopyrrol-Formoterol (BREZTRI AEROSPHERE) 160-9-4.8 MCG/ACT AERO Inhale 2 puffs into the lungs in the morning and at bedtime. 10.7 g 3   diltiazem (CARDIZEM) 30 MG tablet Take 1 tablet (30 mg total) by mouth daily as needed (For palpitations). May take an additional 30 mg tablet daily as needed for palpitations 135 tablet 1   LORazepam (ATIVAN) 0.5 MG tablet Take 0.5 mg by mouth daily as needed for anxiety.     methotrexate (RHEUMATREX) 2.5 MG tablet Take 4 tablets (10 mg total) by mouth once a week. 16 tablet 0   metoCLOPramide  (REGLAN) 5 MG tablet Take 1 tablet (5 mg total) by mouth 2 (two) times daily. 60 tablet 5   mirabegron ER (MYRBETRIQ) 25 MG TB24 tablet Take 1 tablet (25 mg total) by mouth daily. 30 tablet 1   olmesartan (BENICAR) 20 MG tablet Take 1 tablet (20 mg total) by  mouth daily. 90 tablet 1   pantoprazole (PROTONIX) 40 MG tablet Take 1 tablet (40 mg total) by mouth daily. 90 tablet 3   rosuvastatin (CRESTOR) 10 MG tablet Take 1 tablet (10 mg total) by mouth at bedtime. 90 tablet 1   Vitamin D, Ergocalciferol, (DRISDOL) 1.25 MG (50000 UNIT) CAPS capsule Take 1 capsule (50,000 Units total) by mouth every 7 (seven) days. 20 capsule 1   XARELTO 20 MG TABS tablet TAKE 1 TABLET(20 MG) BY MOUTH DAILY WITH SUPPER 90 tablet 3   predniSONE (DELTASONE) 10 MG tablet Take 4 tabs by mouth once daily x4 days, then 3 tabs x4 days, 2 tabs x4 days, 1 tab x4 days and stop. 40 tablet 0   No facility-administered medications prior to visit.    No Known Allergies  Review of Systems  Constitutional:  Negative for chills and fever.  Eyes:  Negative for visual disturbance.  Respiratory:  Negative for chest tightness and shortness of breath.   Skin:  Positive for wound.  Neurological:  Negative for dizziness and headaches.       Objective:    Physical Exam HENT:     Head: Normocephalic.     Mouth/Throat:     Mouth: Mucous membranes are moist.  Cardiovascular:     Rate and Rhythm: Normal rate.     Heart sounds: Normal heart sounds.  Pulmonary:     Effort: Pulmonary effort is normal.     Breath sounds: Normal breath sounds.  Skin:    Findings: Lesion present.  Neurological:     Mental Status: She is alert.     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 Referral placed to dermatology for evaluation and possible excision of the cyst. Warm compresses may help promote drainage if no active infection is present. Will initiate therapy on doxycycline for seven days advised to avoid squeezing or manipulating the area to prevent worsening inflammation or secondary infection. Encourage hygiene measures, including using antibacterial soap and avoiding irritants like deodorants or shaving in the affected area. When to Seek Urgent Care: If the cyst becomes extremely painful, red, or swollen If fever, chills, or signs of spreading infection occur If the drainage increases or becomes foul-smelling   Orders: -     Mupirocin; Apply 1 Application topically 2 (two) times daily.  Dispense: 22 g; Refill: 0 -     Ambulatory referral to Dermatology -     Doxycycline Hyclate; Take 1 tablet (100 mg total) by mouth 2 (two) times daily for 7 days.  Dispense: 14 tablet; Refill: 0  Note: This chart has been completed using Engineer, civil (consulting) software, and while attempts have been made to ensure accuracy, certain words and phrases may not be transcribed as intended.    Gilmore Laroche, FNP

## 2023-05-26 NOTE — Patient Instructions (Addendum)
I appreciate the opportunity to provide care to you today!    Boil Management:  Antibiotic Therapy: Take doxycycline 100 mg twice daily (BID) for 7 days during flare-ups  Topical Antibiotic: Apply Mupirocin (Bactroban) to the affected area to reduce bacterial colonization on the skin.   Here are some nonpharmacological interventions to help manage and alleviate symptoms associated with boils:  Warm Compresses: Application: Apply a warm, moist compress to the affected area for 20 minutes several times a day. This can help increase blood circulation, promote drainage, and ease discomfort. Proper Hygiene: Cleansing: Gently wash the area around the boil with mild soap and water. Avoid squeezing or picking at the boil, as this can spread infection. Keep the Area Clean and Dry: Dressings: Cover the boil with a sterile bandage or dressing to protect it and keep it clean. Change the dressing regularly, especially if it becomes wet or soiled. Avoid Tight Clothing: Comfort: Wear loose, breathable clothing to prevent irritation and friction on the boil. Good Nutrition: Diet: Maintain a balanced diet rich in vitamins and minerals to support your immune system and overall skin health. Foods high in vitamin C, such as citrus fruits and leafy greens, can be beneficial. Stay Hydrated: Fluids: Drink plenty of water to support overall health and help your body fight the infection. Natural Remedies: Tea Tree Oil: Applying diluted tea tree oil (mixed with a carrier oil) to the boil may have antimicrobial properties and help with healing. Turmeric: Applying a paste made from turmeric powder and water to the boil may help reduce inflammation due to its natural anti-inflammatory properties. Avoid Sharing Personal Items: Prevention: Do not share towels, razors, or other personal items to prevent the spread of infection. Rest: Recovery: Ensure you get adequate rest to help your body fight the infection and  recover more quickly.    Referral:  Dermatology for possible excision of the cyst.   Attached with your AVS, you will find valuable resources for self-education. I highly recommend dedicating some time to thoroughly examine them.   Please continue to a heart-healthy diet and increase your physical activities. Try to exercise for at least five days a week.    It was a pleasure to see you and I look forward to continuing to work together on your health and well-being. Please do not hesitate to call the office if you need care or have questions about your care.  In case of emergency, please visit the Emergency Department for urgent care, or contact our clinic at [phone] to schedule an appointment. We're here to help you!   Have a wonderful day and week. With Gratitude, Gilmore Laroche MSN, FNP-BC

## 2023-06-05 NOTE — Telephone Encounter (Signed)
 Called the pt and there was no answer- LMTCB  Need further details on her symptoms

## 2023-06-05 NOTE — Telephone Encounter (Signed)
 She states she only coughing up green and yellow phlegm. Slight tightness in her chest. No pain or fever. She's been coughing for about 3 or 4 days.   Pharmacy: [facility] in [place]

## 2023-06-12 ENCOUNTER — Ambulatory Visit: Admitting: Internal Medicine

## 2023-06-12 ENCOUNTER — Ambulatory Visit (INDEPENDENT_AMBULATORY_CARE_PROVIDER_SITE_OTHER)

## 2023-06-12 ENCOUNTER — Encounter: Payer: Self-pay | Admitting: Internal Medicine

## 2023-06-12 ENCOUNTER — Telehealth: Payer: Self-pay | Admitting: *Deleted

## 2023-06-12 VITALS — BP 124/82 | HR 82 | Ht 65.0 in | Wt 228.0 lb

## 2023-06-12 DIAGNOSIS — N189 Chronic kidney disease, unspecified: Secondary | ICD-10-CM | POA: Diagnosis not present

## 2023-06-12 DIAGNOSIS — N1831 Chronic kidney disease, stage 3a: Secondary | ICD-10-CM | POA: Diagnosis not present

## 2023-06-12 DIAGNOSIS — R059 Cough, unspecified: Secondary | ICD-10-CM | POA: Diagnosis not present

## 2023-06-12 DIAGNOSIS — Z87891 Personal history of nicotine dependence: Secondary | ICD-10-CM

## 2023-06-12 DIAGNOSIS — J4489 Other specified chronic obstructive pulmonary disease: Secondary | ICD-10-CM

## 2023-06-12 DIAGNOSIS — D869 Sarcoidosis, unspecified: Secondary | ICD-10-CM | POA: Diagnosis not present

## 2023-06-12 DIAGNOSIS — J984 Other disorders of lung: Secondary | ICD-10-CM | POA: Diagnosis not present

## 2023-06-12 DIAGNOSIS — I129 Hypertensive chronic kidney disease with stage 1 through stage 4 chronic kidney disease, or unspecified chronic kidney disease: Secondary | ICD-10-CM | POA: Diagnosis not present

## 2023-06-12 MED ORDER — BUDESONIDE-FORMOTEROL FUMARATE 80-4.5 MCG/ACT IN AERO: INHALATION_SPRAY | RESPIRATORY_TRACT | 12 refills | Status: DC

## 2023-06-12 MED ORDER — DOXYCYCLINE HYCLATE 100 MG PO TABS: 100.0000 mg | ORAL_TABLET | Freq: Two times a day (BID) | ORAL | 0 refills | Status: DC

## 2023-06-12 MED ORDER — PREDNISONE 10 MG PO TABS: ORAL_TABLET | ORAL | 0 refills | Status: DC

## 2023-06-12 NOTE — Patient Instructions (Signed)
 Plan A = Automatic = Always=    symbicort 80 Take 2 puffs first thing in am and then another 2 puffs about 12 hours later.    Work on inhaler technique:  relax and gently blow all the way out then take a nice smooth full deep breath back in, triggering the inhaler at same time you start breathing in.  Hold breath in for at least  5 seconds if you can. Blow out symbicort  thru nose. Rinse and gargle with water when done.  If mouth or throat bother you at all,  try brushing teeth/gums/tongue with arm and hammer toothpaste/ make a slurry and gargle and spit out.      Plan B = Backup (to supplement plan A, not to replace it) Only use your albuterol inhaler as a rescue medication to be used if you can't catch your breath by resting or doing a relaxed purse lip breathing pattern.  - The less you use it, the better it will work when you need it. - Ok to use the inhaler up to 2 puffs  every 4 hours if you must but call for appointment if use goes up over your usual need - Don't leave home without it !!  (think of it like the spare tire for your car)   Doxy twice daily x 7 day s  Prednisone 10 mg take  4 each am x 2 days,   2 each am x 2 days,  1 each am x 2 days and stop    Please remember to go to the  x-ray department  for your tests - we will call you with the results when they are available    Keep prior appt

## 2023-06-12 NOTE — Telephone Encounter (Signed)
 Called and spoke with patient, scheduled patient for acute visit with Dr. Sherene Sires d/t symptoms since 2/25 (cough with green mucous, chest tightness and increase sob).  Kids in home have been sick, however, no one has tested positive for covid or flu.  Scheduled to see Dr. Sherene Sires at 3:30 pm, advised to arrive at 3:15 pm for check out.  She verbalized understanding.  Nothing further needed.

## 2023-06-12 NOTE — Progress Notes (Signed)
 Hailey Ross, female    DOB: 29-Jul-1970   MRN: [id number]   Brief patient profile:  45   yobf former pt of [facility]   self- referred to pulmonary clinic 06/12/2023  with recurrent chest tightness onset 06/09/24         History of Present Illness  06/12/2023  Pulmonary/Acute office eval Hailey Ross maint on breztri  re cough/ sob  Chief Complaint  Patient presents with   Cough    Xseveral weeks; productive; denies fever/chills/body aches/other symptoms  Dyspnea:  more sob since onset 2 weeks prior to OV  then chest tghtness x 06/09/24  some better p saba Cough: turned green am of ov with traces of brb  on DOAC Sleep: feels fine bed flat/ one pillow  SABA use: not today  2 YQM:VHQI     No obvious day to day or daytime pattern/variability or   mucus plugs or hemoptysis or cp or  subjective wheeze or overt sinus or hb symptoms.    Also denies any obvious fluctuation of symptoms with weather or environmental changes or other aggravating or alleviating factors except as outlined above   No unusual exposure hx or h/o childhood pna/ asthma or knowledge of premature birth.  Current Allergies, Complete Past Medical History, Past Surgical History, Family History, and Social History were reviewed in [employer] record.  ROS  The following are not active complaints unless bolded Hoarseness, sore throat, dysphagia, dental problems, itching, sneezing,  nasal congestion or discharge of excess mucus or purulent secretions, ear ache,   fever, chills, sweats, unintended wt loss or wt gain, classically pleuritic or exertional cp,  orthopnea pnd or arm/hand swelling  or leg swelling, presyncope, palpitations, abdominal pain, anorexia, nausea, vomiting, diarrhea  or change in bowel habits or change in bladder habits, change in stools or change in urine, dysuria, hematuria,  rash, arthralgias, visual complaints, headache, numbness, weakness or ataxia or problems with walking or coordination,   change in mood or  memory.             Outpatient Medications Prior to Visit  Medication Sig Dispense Refill   acetaminophen (TYLENOL) 500 MG tablet Take 500 mg by mouth every 4 (four) hours as needed for mild pain, moderate pain or headache.      albuterol (VENTOLIN HFA) 108 (90 Base) MCG/ACT inhaler Inhale 2 puffs into the lungs every 6 (six) hours as needed for wheezing or shortness of breath. 8 g 0   azelastine (ASTELIN) 0.1 % nasal spray Place 1 spray into both nostrils daily at 2 am. Use in each nostril as directed (Patient taking differently: Place 1 spray into both nostrils as needed. Use in each nostril as directed) 30 mL 12   Budeson-Glycopyrrol-Formoterol (BREZTRI AEROSPHERE) 160-9-4.8 MCG/ACT AERO Inhale 2 puffs into the lungs in the morning and at bedtime. 10.7 g 3   diltiazem (CARDIZEM) 30 MG tablet Take 1 tablet (30 mg total) by mouth daily as needed (For palpitations). May take an additional 30 mg tablet daily as needed for palpitations 135 tablet 1   LORazepam (ATIVAN) 0.5 MG tablet Take 0.5 mg by mouth daily as needed for anxiety.     methotrexate (RHEUMATREX) 2.5 MG tablet Take 4 tablets (10 mg total) by mouth once a week. 16 tablet 0   metoCLOPramide (REGLAN) 5 MG tablet Take 1 tablet (5 mg total) by mouth 2 (two) times daily. 60 tablet 5   mirabegron ER (MYRBETRIQ) 25 MG TB24 tablet Take  1 tablet (25 mg total) by mouth daily. 30 tablet 1   mupirocin ointment (BACTROBAN) 2 % Apply 1 Application topically 2 (two) times daily. 22 g 0   olmesartan (BENICAR) 20 MG tablet Take 1 tablet (20 mg total) by mouth daily. 90 tablet 1   pantoprazole (PROTONIX) 40 MG tablet Take 1 tablet (40 mg total) by mouth daily. 90 tablet 3   rosuvastatin (CRESTOR) 10 MG tablet Take 1 tablet (10 mg total) by mouth at bedtime. 90 tablet 1   Vitamin D, Ergocalciferol, (DRISDOL) 1.25 MG (50000 UNIT) CAPS capsule Take 1 capsule (50,000 Units total) by mouth every 7 (seven) days. 20 capsule 1   XARELTO 20  MG TABS tablet TAKE 1 TABLET(20 MG) BY MOUTH DAILY WITH SUPPER 90 tablet 3   No facility-administered medications prior to visit.    Past Medical History:  Diagnosis Date   Acute cholecystitis 08/24/2016   Acute kidney injury superimposed on chronic kidney disease (HCC) 06/20/2019   Acute respiratory failure with hypoxia (HCC) 06/19/2019   Annual visit for general adult medical examination with abnormal findings 04/16/2019   Arthritis    right knee   Bronchiectasis (HCC)    CKD (chronic kidney disease)    Iron deficiency    Lightheadedness 05/10/2019   PE (pulmonary thromboembolism) (HCC) 06/2019   Pre-syncope 04/16/2019   Sarcoidosis    Sarcoidosis    Sleep apnea    c pap   Smoker 02/12/2014      Objective:     BP 124/82   Pulse 82   Ht 5\' 5"  (1.651 m)   Wt 228 lb (103.4 kg)   LMP 04/01/2021 (Exact Date)   SpO2 92%   BMI 37.94 kg/m   SpO2: 92 %  Amb bf nad    HEENT : Oropharynx  clear      Nasal turbinates nl    NECK :  without  apparent JVD/ palpable Nodes/TM    LUNGS: no acc muscle use,  Nl contour chest which is clear to A and P bilaterally without cough on insp or exp maneuvers   CV:  RRR  no s3 or murmur or increase in P2, and no edema   ABD:  soft and nontender   MS:  Gait nl   ext warm without deformities Or obvious joint restrictions  calf tenderness, cyanosis or clubbing    SKIN: warm and dry without lesions    NEURO:  alert, approp, nl sensorium with  no motor or cerebellar deficits apparent.      CXR PA and Lateral:   06/12/2023 :    I personally reviewed images and impression is as follows:     Chronic coarse changes c/w prior sarcoid/ no acute dz   Official reading 1. No acute radiographic findings. 2. Chronic volume loss in the left hemithorax. 3. Chronic subpleural reticulation and interstitial coarsening, not significantly changed.    Assessment   Asthmatic bronchitis , chronic (HCC) Quit smoking 2018  PFT 06/20/22 >> FEV1 1.04  (36%),  ratio 0.83, TLC 3.23 (62%), DLCO 32%  - 06/12/2023  After extensive coaching inhaler device,  effectiveness =   60 % (short ti) > try symbicort 80 2bid instead of breztri  given the clear exam on 06/12/2023 and absence of airflow obst on pfts   Rx acute flare AB with doxy / prednisone and f/u as planned in this clinic - call sooner if needed         Each maintenance medication  was reviewed in detail including emphasizing most importantly the difference between maintenance and prns and under what circumstances the prns are to be triggered using an action plan format where appropriate.  Total time for H and P, chart review, counseling, reviewing hfa device(s) and generating customized AVS unique to this office visit / same day charting = 30 min acute eval of pt new to me with extensive data base to review.           Sandrea Hughs, MD 06/12/2023

## 2023-06-12 NOTE — Assessment & Plan Note (Signed)
 Quit smoking 2018  PFT 06/20/22 >> FEV1 1.04 (36%),  ratio 0.83, TLC 3.23 (62%), DLCO 32%  - 06/12/2023  After extensive coaching inhaler device,  effectiveness =   60 % (short ti) > try symbicort 80 2bid instead of breztri  given the clear exam on 06/12/2023 and absence of airflow obst on pfts   Rx acute flare AB with doxy / prednisone and f/u as planned in this clinic - call sooner if needed         Each maintenance medication was reviewed in detail including emphasizing most importantly the difference between maintenance and prns and under what circumstances the prns are to be triggered using an action plan format where appropriate.  Total time for H and P, chart review, counseling, reviewing hfa device(s) and generating customized AVS unique to this office visit / same day charting = 30 min acute eval of pt new to me with extensive data base to review.

## 2023-06-14 NOTE — Telephone Encounter (Signed)
 Per mychart message:  Was  my chest X-RAY normal. I haven't received a result yet?   Please advise and call patient with results

## 2023-06-15 ENCOUNTER — Encounter: Payer: Self-pay | Admitting: Family Medicine

## 2023-06-15 DIAGNOSIS — I129 Hypertensive chronic kidney disease with stage 1 through stage 4 chronic kidney disease, or unspecified chronic kidney disease: Secondary | ICD-10-CM | POA: Diagnosis not present

## 2023-06-15 DIAGNOSIS — N1831 Chronic kidney disease, stage 3a: Secondary | ICD-10-CM | POA: Diagnosis not present

## 2023-06-15 DIAGNOSIS — G4733 Obstructive sleep apnea (adult) (pediatric): Secondary | ICD-10-CM | POA: Diagnosis not present

## 2023-06-15 DIAGNOSIS — D869 Sarcoidosis, unspecified: Secondary | ICD-10-CM | POA: Diagnosis not present

## 2023-06-15 NOTE — Telephone Encounter (Signed)
 I called and spoke with the pt  I advised her that radiology has not read the cxr from 06/12/23 yet and that is the reason no report in her mychart   Dr Sherene Sires- can you look at the images and let us know if there is anything concerning you see? Thanks!

## 2023-06-15 NOTE — Telephone Encounter (Signed)
 I thought it was the same as priors showing mild scarring from sarcoid, no acute finding so no change in recs from OV

## 2023-06-19 ENCOUNTER — Telehealth: Payer: Self-pay | Admitting: Internal Medicine

## 2023-06-19 NOTE — Telephone Encounter (Signed)
 Received form from [employer]  They are asking for records from 03-12-23 to 06-16-23 The form asked about return to work date and for work restrictions  I called and spoke with the pt  She states that she is not working and there is no return to work date  She just needs her records faxed to [facility] [phone]- the deadline is 06/30/23   Her cxr from 06/12/23 was not read yet. I called [place] radiology and asked them to read now so we can fax report to [employer]  Will await the cxr report and then fax all records  Forms in my office until fax

## 2023-06-19 NOTE — Telephone Encounter (Signed)
 Faxed forms to [employer] [phone]. All information has been faxed including cxr report. NFN

## 2023-07-05 DIAGNOSIS — B9689 Other specified bacterial agents as the cause of diseases classified elsewhere: Secondary | ICD-10-CM | POA: Diagnosis not present

## 2023-07-05 DIAGNOSIS — L02421 Furuncle of right axilla: Secondary | ICD-10-CM | POA: Diagnosis not present

## 2023-07-16 DIAGNOSIS — G4733 Obstructive sleep apnea (adult) (pediatric): Secondary | ICD-10-CM | POA: Diagnosis not present

## 2023-07-21 DIAGNOSIS — G4733 Obstructive sleep apnea (adult) (pediatric): Secondary | ICD-10-CM | POA: Diagnosis not present

## 2023-07-21 NOTE — Progress Notes (Deleted)
 Office Visit Note  Patient: Hailey Ross             Date of Birth: 1970-11-17           MRN: [id number]             PCP: Gilmore Laroche, FNP Referring: Gilmore Laroche, FNP Visit Date: 08/03/2023 Occupation: @GUAROCC @  Subjective:  No chief complaint on file.   History of Present Illness: Hailey Ross is a 53 y.o. female ***     Activities of Daily Living:  Patient reports morning stiffness for *** {minute/hour:19697}.   Patient {ACTIONS;DENIES/REPORTS:21021675::"Denies"} nocturnal pain.  Difficulty dressing/grooming: {ACTIONS;DENIES/REPORTS:21021675::"Denies"} Difficulty climbing stairs: {ACTIONS;DENIES/REPORTS:21021675::"Denies"} Difficulty getting out of chair: {ACTIONS;DENIES/REPORTS:21021675::"Denies"} Difficulty using hands for taps, buttons, cutlery, and/or writing: {ACTIONS;DENIES/REPORTS:21021675::"Denies"}  No Rheumatology ROS completed.   PMFS History:  Patient Active Problem List   Diagnosis Date Noted   Asthmatic bronchitis , chronic (HCC) 06/12/2023   Abscess 05/26/2023   Prediabetes 02/20/2023   Multiple cysts of breast 02/17/2023   Overactive bladder 01/18/2023   Chronic anticoagulation 12/06/2022   Epistaxis 12/06/2022   Acute frontal sinusitis 12/06/2022   Acute respiratory failure with hypoxia (HCC) 12/05/2022   Gastroparesis 11/29/2022   Colitis 10/18/2022   Other constipation 10/04/2022   Upper abdominal pain 04/29/2022   Early satiety 04/29/2022   Acute bronchitis 03/21/2022   Immunization due 12/17/2021   Need for immunization against influenza 12/17/2021   Fatigue 12/17/2021   Encounter for annual general medical examination with abnormal findings in adult 08/30/2021   CAP (community acquired pneumonia) 06/16/2021   Acute midline low back pain without sciatica 04/22/2021   Left lower quadrant abdominal pain 04/20/2021   Atypical chest pain 01/26/2021   Synovitis of left knee 01/19/2021   GERD (gastroesophageal reflux  disease) 12/29/2020   Abdominal pain, epigastric 12/28/2020   Primary osteoarthritis of left knee 10/27/2020   Grief at loss of child 06/09/2020   Insomnia 06/09/2020   Right ankle swelling 02/05/2020   Encounter for examination following treatment at hospital 01/28/2020   Palpitations 01/28/2020   Menorrhagia with regular cycle 10/15/2019   OSA (obstructive sleep apnea) 10/08/2019   Right knee pain 08/29/2019   Positive ANA (antinuclear antibody) 08/29/2019   Essential hypertension 07/04/2019   Pain and swelling of right knee 07/04/2019   Hyperlipidemia 07/04/2019   Stage 2 chronic kidney disease 07/04/2019   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   Encounter for support and coordination of transition of care 07/04/2019   Daytime hypersomnolence 07/02/2019   Pulmonary hypertension (HCC) 07/02/2019   CKD (chronic kidney disease)    Bilateral pulmonary embolism (HCC) 06/18/2019   Lightheadedness 05/10/2019   Vitamin D deficiency 05/10/2019   Obesity (BMI 30-39.9) 04/16/2019   Sarcoidosis 05/04/2015   Bronchiectasis (HCC) 05/04/2015    Past Medical History:  Diagnosis Date   Acute cholecystitis 08/24/2016   Acute kidney injury superimposed on chronic kidney disease (HCC) 06/20/2019   Acute respiratory failure with hypoxia (HCC) 06/19/2019   Annual visit for general adult medical examination with abnormal findings 04/16/2019   Arthritis    right knee   Bronchiectasis (HCC)    CKD (chronic kidney disease)    Iron deficiency    Lightheadedness 05/10/2019   PE (pulmonary thromboembolism) (HCC) 06/2019   Pre-syncope 04/16/2019   Sarcoidosis    Sarcoidosis    Sleep apnea    c pap   Smoker 02/12/2014    Family History  Problem Relation Age of Onset   Hypertension Mother  Cancer Mother    Colon polyps Mother    Healthy Sister    Healthy Sister    Healthy Sister    Healthy Sister    Healthy Son    Healthy Son    Healthy Daughter    Colon cancer Neg Hx    Esophageal cancer Neg  Hx    Rectal cancer Neg Hx    Stomach cancer Neg Hx    Past Surgical History:  Procedure Laterality Date   CHOLECYSTECTOMY N/A 08/25/2016   Procedure: LAPAROSCOPIC CHOLECYSTECTOMY;  Surgeon: Abigail Miyamoto, MD;  Location: [facility] OR;  Service: General;  Laterality: N/A;   TUBAL LIGATION  02/1993   Social History   Social History Narrative   Live with partners [name]-19 years   3 children.    1-[name] (girl) 27 at home with her: 2 grandchildren in her home as well    [name]- 26 expecting   [name]-25      Enjoy: read, play games on tablet, grandkids      Diet: Veggies, does not eat a lot of fried foods, enjoys chicken and fruit   Caffeine: sodas and coffee (with sugar)   Water: Does not drink a lot at all      Wears seat beat   Does not wear sunscreen   Smoke and carbon monoxide detectors   Does not use phone while driving    Immunization History  Administered Date(s) Administered   Influenza Inj Mdck Quad Pf 12/25/2019   Influenza,inj,Quad PF,6+ Mos 12/21/2018, 02/22/2021, 12/17/2021   Influenza,inj,Quad PF,6-35 Mos 01/10/2019   Influenza-Unspecified 12/20/2022   PFIZER(Purple Top)SARS-COV-2 Vaccination 08/14/2019, 09/10/2019, 02/07/2020   Pneumococcal Polysaccharide-23 02/12/2014   Tdap 02/12/2014   Zoster Recombinant(Shingrix) 12/17/2021, 10/18/2022     Objective: Vital Signs: LMP 04/01/2021 (Exact Date)    Physical Exam   Musculoskeletal Exam: ***  CDAI Exam: CDAI Score: -- Patient Global: --; Provider Global: -- Swollen: --; Tender: -- Joint Exam 08/03/2023   No joint exam has been documented for this visit   There is currently no information documented on the homunculus. Go to the Rheumatology activity and complete the homunculus joint exam.  Investigation: No additional findings.  Imaging: No results found.  Recent Labs: Lab Results  Component Value Date   WBC 12.0 (H) 01/17/2023   HGB 13.0 01/17/2023   PLT 374 01/17/2023   NA 146 (H)  01/17/2023   K 4.6 01/17/2023   CL 107 (H) 01/17/2023   CO2 21 01/17/2023   GLUCOSE 102 (H) 01/17/2023   BUN 17 01/17/2023   CREATININE 1.07 (H) 01/17/2023   BILITOT 0.4 04/25/2023   ALKPHOS 98 04/25/2023   AST 23 04/25/2023   ALT 23 04/25/2023   PROT 7.4 04/25/2023   ALBUMIN 4.1 04/25/2023   CALCIUM 9.2 01/17/2023   GFRAA 62 09/23/2020   QFTBGOLD Negative 05/01/2015   QFTBGOLDPLUS NEGATIVE 11/08/2019    Speciality Comments: No specialty comments available.  Procedures:  No procedures performed Allergies: Patient has no known allergies.   Assessment / Plan:     Visit Diagnoses: No diagnosis found.  Orders: No orders of the defined types were placed in this encounter.  No orders of the defined types were placed in this encounter.   Face-to-face time spent with patient was *** minutes. Greater than 50% of time was spent in counseling and coordination of care.  Follow-Up Instructions: No follow-ups on file.   Ellen Henri, CMA  Note - This record has been created using Animal nutritionist.  Chart creation errors have been sought, but may not always  have been located. Such creation errors do not reflect on  the standard of medical care.

## 2023-07-24 ENCOUNTER — Other Ambulatory Visit: Payer: Self-pay | Admitting: Adult Health

## 2023-07-31 ENCOUNTER — Ambulatory Visit: Payer: BC Managed Care – PPO | Admitting: Internal Medicine

## 2023-07-31 ENCOUNTER — Ambulatory Visit (INDEPENDENT_AMBULATORY_CARE_PROVIDER_SITE_OTHER): Payer: BC Managed Care – PPO | Admitting: Internal Medicine

## 2023-07-31 ENCOUNTER — Encounter: Payer: Self-pay | Admitting: Internal Medicine

## 2023-07-31 VITALS — BP 126/74 | HR 58 | Ht 65.0 in | Wt 225.0 lb

## 2023-07-31 DIAGNOSIS — J8410 Pulmonary fibrosis, unspecified: Secondary | ICD-10-CM

## 2023-07-31 DIAGNOSIS — Z86711 Personal history of pulmonary embolism: Secondary | ICD-10-CM

## 2023-07-31 DIAGNOSIS — Z79899 Other long term (current) drug therapy: Secondary | ICD-10-CM

## 2023-07-31 DIAGNOSIS — D860 Sarcoidosis of lung: Secondary | ICD-10-CM | POA: Diagnosis not present

## 2023-07-31 DIAGNOSIS — Z5181 Encounter for therapeutic drug level monitoring: Secondary | ICD-10-CM

## 2023-07-31 LAB — PULMONARY FUNCTION TEST
DL/VA % pred: 72 %
DL/VA: 3.08 ml/min/mmHg/L
DLCO unc % pred: 35 %
DLCO unc: 7.69 ml/min/mmHg
FEF 25-75 Pre: 1.28 L/s
FEF2575-%Pred-Pre: 47 %
FEV1-%Pred-Pre: 37 %
FEV1-Pre: 1.07 L
FEV1FVC-%Pred-Pre: 106 %
FEV6-%Pred-Pre: 36 %
FEV6-Pre: 1.27 L
FEV6FVC-%Pred-Pre: 102 %
FVC-%Pred-Pre: 35 %
FVC-Pre: 1.27 L
Pre FEV1/FVC ratio: 84 %
Pre FEV6/FVC Ratio: 100 %
RV % pred: 67 %
RV: 1.29 L
TLC % pred: 52 %
TLC: 2.74 L

## 2023-07-31 NOTE — Patient Instructions (Signed)
 FVC/Pleth/Dlco performed today

## 2023-07-31 NOTE — Progress Notes (Signed)
 FVC/Pleth/Dlco performed today

## 2023-07-31 NOTE — Progress Notes (Signed)
 OV 04/24/2023 -transfer of care from Dr. Matilde Son who has left the practice to Dr. Bertrum Brodie in the ILD center.  She has sarcoidosis with fibrotic changes.  Subjective:  Patient ID: Hailey Ross, female , DOB: 1970-06-14 , age 53 y.o. , MRN: [id number] , ADDRESS: [address] [place] [address] PCP Zarwolo, Gloria, FNP Patient Care Team: Zarwolo, Gloria, FNP as PCP - General (Family Medicine) Amanda Jungling, Joyceann No, MD as PCP - Cardiology (Cardiology) Paulett Boros, MD as Consulting Physician (Hematology) Riley Cheadle Windsor Hatcher, MD as Consulting Physician (Gastroenterology)  This Provider for this visit: Treatment Team:  Attending Provider: Maire Scot, MD    04/24/2023 -   Chief Complaint  Patient presents with   Consult    Denies any concerns. Use to see Dr.sood, states breathing has been fine. Using breztri  inhaler      HPI Hailey Ross 53 y.o. 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  Sleep apnea on CPAP.  She has a history of sarcoidosis.  Along with fibrotic changes she says she has been following with Dr. Reta Cassis in 2017.  In 2024 she said her PFTs were declining.  She does have shortness of breath on exertion relieved by rest particularly for cleaning the house or doing any vacuuming.  While climbing up a hill.  This been going on for a long time and is chronic.  She came in for this chronic visit and ILD evaluation but she reported acute problems of having worsening cough for the last [redacted] weeks along with chest tightness along with sputum that is yellow-green and is also increased shortness of breath.  Her husband and her grandkids have been sick with respiratory infection in this winter season as recently as last week and she has been exposed to them.  She is on methotrexate  for her sarcoid.  Her last CT scan of the chest was in August 2024.    OV 07/31/2023  Subjective:  Patient ID: Hailey Ross, female , DOB: 1970-06-23 , age 53 y.o. , MRN: [id number] , ADDRESS: [address] [place] [address] PCP Zarwolo, Gloria, FNP Patient Care Team: Zarwolo, Gloria, FNP as PCP - General (Family Medicine) Amanda Jungling, Joyceann No, MD as PCP - Cardiology (Cardiology) Paulett Boros, MD as Consulting Physician (Hematology) Riley Cheadle Windsor Hatcher, MD as Consulting Physician (Gastroenterology)  This Provider for this visit: Treatment Team:  Attending Provider: Maire Scot, MD    07/31/2023 -   Chief Complaint  Patient presents with   Follow-up    PFT done today. Breathing is overall doing well.      HPI Hailey Ross 53 y.o. -Presents for followup of sarcoid.  On 06/12/23 saw Dr Anthon Kins acutely and given doxy/prednisone  but now well. [place], Interim Health status: No new complaints No new medical problems. No new surgeries. No ER visits. No Urgent care visits. No changes to medications. Dyspnea stable. Presents with exerton . There is also variability on some days There is some wheezing at night as well occassinal But overall stable.. Dr Waymond Hailey changed trelegy to symbicort   Lung function stable 2021 -> 2025 Eercise hypoxemia test -> stabl       SIT STAND TEST - goal 15 times   07/31/2023    O2 used ra   PRobe - finter or forehead forehead   Number sit and stand completed - goal 15 Yes 15   Time taken to complete 58 sec  Resting Pulse Ox/HR/Dyspnea  96% and 61/min and dyspnea of 1/10    Peak measures 90 % and 89/min and dyspnea of 5/10   Final Pulse Ox/HR 90% and 77/min and dyspnea of 2/10   Desaturated </= 88% NO   Desaturated <= 3% points YES   Got Tachycardic >/= 90/min no   Miscellaneous comments no      PFT     Latest Ref Rng & Units 07/31/2023   10:02 AM 06/20/2022   11:05 AM 09/26/2019    2:56 PM 05/26/2015    2:59 PM  PFT Results  FVC-Pre L 1.27  1.25  P 1.25  1.78   FVC-Predicted Pre % 35  34  P 34  57   FVC-Post L  1.16  P 1.16  1.88   FVC-Predicted  Post %  32  P 32  60   Pre FEV1/FVC % % 84  83  P 83  85   Post FEV1/FCV % %  87  P 87  88   FEV1-Pre L 1.07  1.04  P 1.04  1.51   FEV1-Predicted Pre % 37  36  P 36  59   FEV1-Post L  1.01  P 1.01  1.67   DLCO uncorrected ml/min/mmHg 7.69  6.93  P 6.93  9.41   DLCO UNC% % 35  32  P 32  36   DLCO corrected ml/min/mmHg  6.93  P 6.93  9.38   DLCO COR %Predicted %  32  P 32  36   DLVA Predicted % 72  67  P 67  73   TLC L 2.74  3.23  P 3.23  2.78   TLC % Predicted % 52  62  P 62  53   RV % Predicted % 67  77  P 77  44     P Preliminary result       LAB RESULTS last 96 hours No results found.       has a past medical history of Acute cholecystitis (08/24/2016), Acute kidney injury superimposed on chronic kidney disease (HCC) (06/20/2019), Acute respiratory failure with hypoxia (HCC) (06/19/2019), Annual visit for general adult medical examination with abnormal findings (04/16/2019), Arthritis, Bronchiectasis (HCC), CKD (chronic kidney disease), Iron deficiency, Lightheadedness (05/10/2019), PE (pulmonary thromboembolism) (HCC) (06/2019), Pre-syncope (04/16/2019), Sarcoidosis, Sarcoidosis, Sleep apnea, and Smoker (02/12/2014).   reports that she quit smoking about 8 years ago. Her smoking use included cigarettes. She started smoking about 28 years ago. She has a 20 pack-year smoking history. She has been exposed to tobacco smoke. She has never used smokeless tobacco.  Past Surgical History:  Procedure Laterality Date   CHOLECYSTECTOMY N/A 08/25/2016   Procedure: LAPAROSCOPIC CHOLECYSTECTOMY;  Surgeon: Oza Blumenthal, MD;  Location: [facility] OR;  Service: General;  Laterality: N/A;   TUBAL LIGATION  02/1993    No Known Allergies  Immunization History  Administered Date(s) Administered   Influenza Inj Mdck Quad Pf 12/25/2019   Influenza,inj,Quad PF,6+ Mos 12/21/2018, 02/22/2021, 12/17/2021   Influenza,inj,Quad PF,6-35 Mos 01/10/2019   Influenza-Unspecified 12/20/2022   PFIZER(Purple  Top)SARS-COV-2 Vaccination 08/14/2019, 09/10/2019, 02/07/2020   Pneumococcal Polysaccharide-23 02/12/2014   Tdap 02/12/2014   Zoster Recombinant(Shingrix ) 12/17/2021, 10/18/2022    Family History  Problem Relation Age of Onset   Hypertension Mother    Cancer Mother    Colon polyps Mother    Healthy Sister    Healthy Sister    Healthy Sister    Healthy Sister    Healthy  Son    Healthy Son    Healthy Daughter    Colon cancer Neg Hx    Esophageal cancer Neg Hx    Rectal cancer Neg Hx    Stomach cancer Neg Hx      Current Outpatient Medications:    acetaminophen  (TYLENOL ) 500 MG tablet, Take 500 mg by mouth every 4 (four) hours as needed for mild pain, moderate pain or headache. , Disp: , Rfl:    albuterol  (VENTOLIN  HFA) 108 (90 Base) MCG/ACT inhaler, Inhale 2 puffs into the lungs every 6 (six) hours as needed for wheezing or shortness of breath., Disp: 8 g, Rfl: 0   azelastine  (ASTELIN ) 0.1 % nasal spray, Place 1 spray into both nostrils daily at 2 am. Use in each nostril as directed (Patient taking differently: Place 1 spray into both nostrils as needed. Use in each nostril as directed), Disp: 30 mL, Rfl: 12   budesonide -formoterol  (SYMBICORT ) 80-4.5 MCG/ACT inhaler, Take 2 puffs first thing in am and then another 2 puffs about 12 hours later., Disp: 1 each, Rfl: 12   diltiazem  (CARDIZEM ) 30 MG tablet, Take 1 tablet (30 mg total) by mouth daily as needed (For palpitations). May take an additional 30 mg tablet daily as needed for palpitations, Disp: 135 tablet, Rfl: 1   LORazepam (ATIVAN) 0.5 MG tablet, Take 0.5 mg by mouth daily as needed for anxiety., Disp: , Rfl:    methotrexate  (RHEUMATREX) 2.5 MG tablet, Take 4 tablets (10 mg total) by mouth once a week., Disp: 16 tablet, Rfl: 0   metoCLOPramide  (REGLAN ) 5 MG tablet, Take 1 tablet (5 mg total) by mouth 2 (two) times daily., Disp: 60 tablet, Rfl: 5   mirabegron  ER (MYRBETRIQ ) 25 MG TB24 tablet, Take 1 tablet (25 mg total) by mouth  daily., Disp: 30 tablet, Rfl: 1   mupirocin  ointment (BACTROBAN ) 2 %, Apply 1 Application topically 2 (two) times daily., Disp: 22 g, Rfl: 0   olmesartan  (BENICAR ) 20 MG tablet, Take 1 tablet (20 mg total) by mouth daily., Disp: 90 tablet, Rfl: 1   pantoprazole  (PROTONIX ) 40 MG tablet, Take 1 tablet (40 mg total) by mouth daily., Disp: 90 tablet, Rfl: 3   rosuvastatin  (CRESTOR ) 10 MG tablet, Take 1 tablet (10 mg total) by mouth at bedtime., Disp: 90 tablet, Rfl: 1   Vitamin D , Ergocalciferol , (DRISDOL ) 1.25 MG (50000 UNIT) CAPS capsule, Take 1 capsule (50,000 Units total) by mouth every 7 (seven) days., Disp: 20 capsule, Rfl: 1   XARELTO  20 MG TABS tablet, TAKE 1 TABLET(20 MG) BY MOUTH DAILY WITH SUPPER, Disp: 90 tablet, Rfl: 3      Objective:   Vitals:   07/31/23 1118 07/31/23 1120  BP: 126/74   Pulse: (!) 58   SpO2: 94%   Weight:  225 lb (102.1 kg)  Height:  5\' 5"  (1.651 m)    Estimated body mass index is 37.44 kg/m as calculated from the following:   Height as of this encounter: 5\' 5"  (1.651 m).   Weight as of this encounter: 225 lb (102.1 kg).  @WEIGHTCHANGE @  [employer]   07/31/23 1120  Weight: 225 lb (102.1 kg)     Physical Exam   General: No distress. obese O2 at rest: no Cane present: no Sitting in wheel chair: no Frail: no Obese: no Neuro: Alert and Oriented x 3. GCS 15. Speech normal Psych: Pleasant Resp:  Barrel Chest - no.  Wheeze - no, Crackles - no, No overt respiratory distress CVS:  Normal heart sounds. Murmurs - no Ext: Stigmata of Connective Tissue Disease - no HEENT: Normal upper airway. PEERL +. No post nasal drip        Assessment:       ICD-10-CM   1. Sarcoidosis of lung (HCC)  D86.0 Pulmonary function test    CT Chest High Resolution    2. Pulmonary fibrosis (HCC)  J84.10 Pulmonary function test    CT Chest High Resolution    3. History of pulmonary embolus (PE)  Z86.711 Pulmonary function test    CT Chest High Resolution    4.  High risk medication use  Z79.899 Pulmonary function test    CT Chest High Resolution         Plan:     Patient Instructions  Sarcoidosis of lung (HCC) High risk medication use Encounter for therapeutic drug monitoring Pulmonary fibrosis (HCC)  - progressive since 2017 but stable 2021 -> 2025 - ok to be on symbicort  instead of brezri - continues on methotrexate /folic acid   Plan'  - continue symbicort ,, methotrexate  , folic acid  as before  - check LFT dermatitis = do spirometyr and dlco in 6 months - HRCT Chest in 6 months  History of pulmonary embolus (PE)  Plan  - continue life long xarelto    Hx  of OSA  Plan  - Wear CPAP At bedtime    Follwup  6 months; 15 min visit in 6 months but after breathing test  - symptom score and exercise hypoxemia test at followup   FOLLOWUP Return in about 6 months (around 01/30/2024) for Face to Face Visit, 15 min visit, after Spiro and DLCO.    SIGNATURE    Dr. Maire Scot, M.D., F.C.C.P,  Pulmonary and Critical Care Medicine Staff Physician, [facility] Director - Interstitial Lung Disease  Program  Pulmonary Fibrosis [facility] at [facility] [place], [place], [address]  Pager: [phone], If no answer or between  15:00h - 7:00h: call [phone] Telephone: [phone]  6:04 PM 07/31/2023

## 2023-07-31 NOTE — Patient Instructions (Addendum)
 Sarcoidosis of lung (HCC) High risk medication use Encounter for therapeutic drug monitoring Pulmonary fibrosis (HCC)  - progressive since 2017 but stable 2021 -> 2025 - ok to be on symbicort  instead of brezri - continues on methotrexate /folic acid   Plan'  - continue symbicort ,, methotrexate  , folic acid  as before  - check LFT dermatitis = do spirometyr and dlco in 6 months - HRCT Chest in 6 months  History of pulmonary embolus (PE)  Plan  - continue life long xarelto    Hx  of OSA  Plan  - Wear CPAP At bedtime    Follwup  6 months; 15 min visit in 6 months but after breathing test  - symptom score and exercise hypoxemia test at followup

## 2023-08-03 ENCOUNTER — Ambulatory Visit: Payer: BC Managed Care – PPO | Admitting: Rheumatology

## 2023-08-03 DIAGNOSIS — E559 Vitamin D deficiency, unspecified: Secondary | ICD-10-CM

## 2023-08-03 DIAGNOSIS — D869 Sarcoidosis, unspecified: Secondary | ICD-10-CM

## 2023-08-03 DIAGNOSIS — J479 Bronchiectasis, uncomplicated: Secondary | ICD-10-CM

## 2023-08-03 DIAGNOSIS — E782 Mixed hyperlipidemia: Secondary | ICD-10-CM

## 2023-08-03 DIAGNOSIS — N2581 Secondary hyperparathyroidism of renal origin: Secondary | ICD-10-CM

## 2023-08-03 DIAGNOSIS — Z79899 Other long term (current) drug therapy: Secondary | ICD-10-CM

## 2023-08-03 DIAGNOSIS — R768 Other specified abnormal immunological findings in serum: Secondary | ICD-10-CM

## 2023-08-03 DIAGNOSIS — M7061 Trochanteric bursitis, right hip: Secondary | ICD-10-CM

## 2023-08-03 DIAGNOSIS — N1831 Chronic kidney disease, stage 3a: Secondary | ICD-10-CM

## 2023-08-03 DIAGNOSIS — G4719 Other hypersomnia: Secondary | ICD-10-CM

## 2023-08-03 DIAGNOSIS — I2699 Other pulmonary embolism without acute cor pulmonale: Secondary | ICD-10-CM

## 2023-08-03 DIAGNOSIS — G8929 Other chronic pain: Secondary | ICD-10-CM

## 2023-08-03 DIAGNOSIS — M25462 Effusion, left knee: Secondary | ICD-10-CM

## 2023-08-03 DIAGNOSIS — M19041 Primary osteoarthritis, right hand: Secondary | ICD-10-CM

## 2023-08-03 DIAGNOSIS — I272 Pulmonary hypertension, unspecified: Secondary | ICD-10-CM

## 2023-08-03 DIAGNOSIS — G4733 Obstructive sleep apnea (adult) (pediatric): Secondary | ICD-10-CM

## 2023-08-03 DIAGNOSIS — I10 Essential (primary) hypertension: Secondary | ICD-10-CM

## 2023-08-09 IMAGING — DX DG CHEST 2V
2 series · 2 of 2 positions shown · non-contrast
Comparison: August 11, 2021.

CLINICAL DATA: Pneumonia.

EXAM:
CHEST - 2 VIEW

[chest pa]
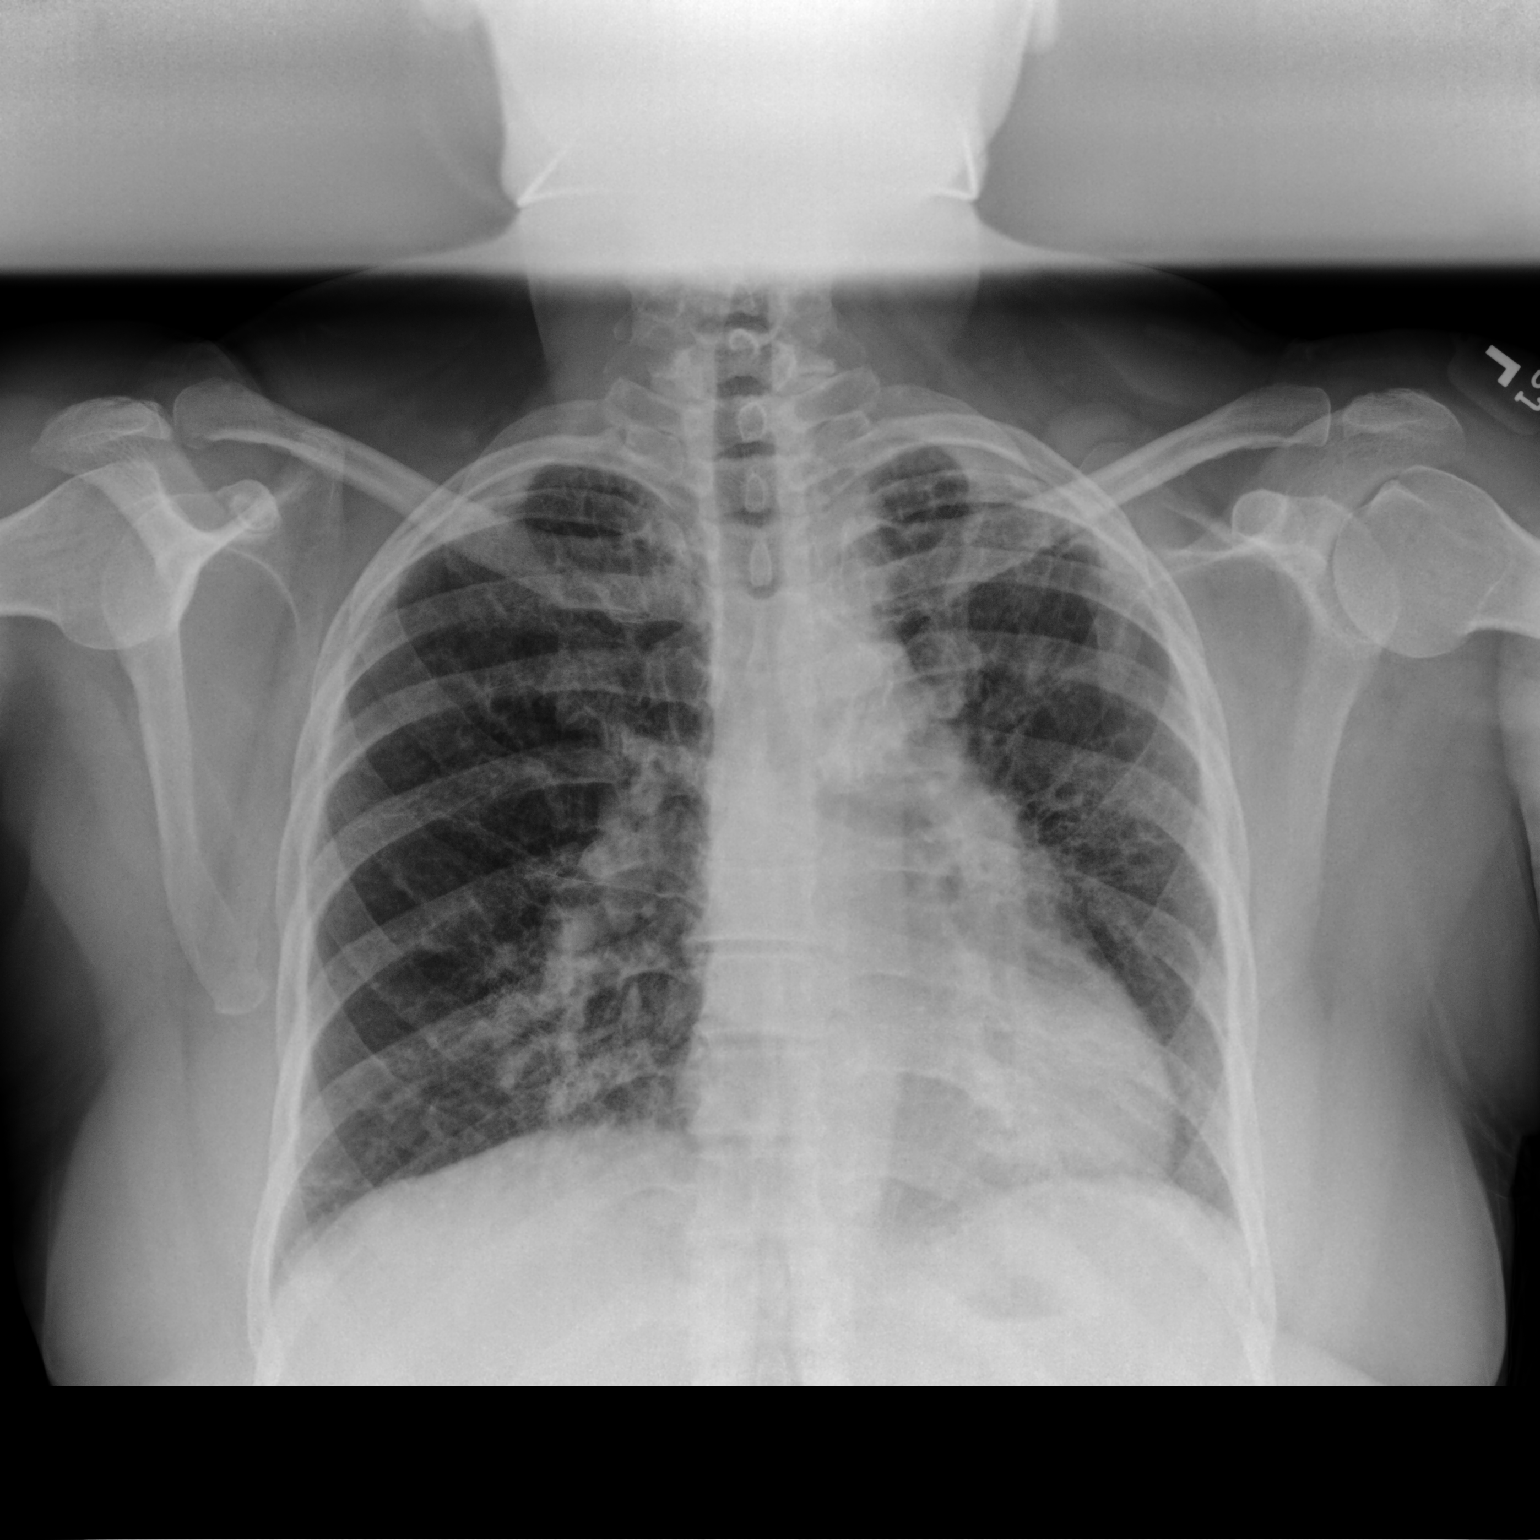

[chest lat]
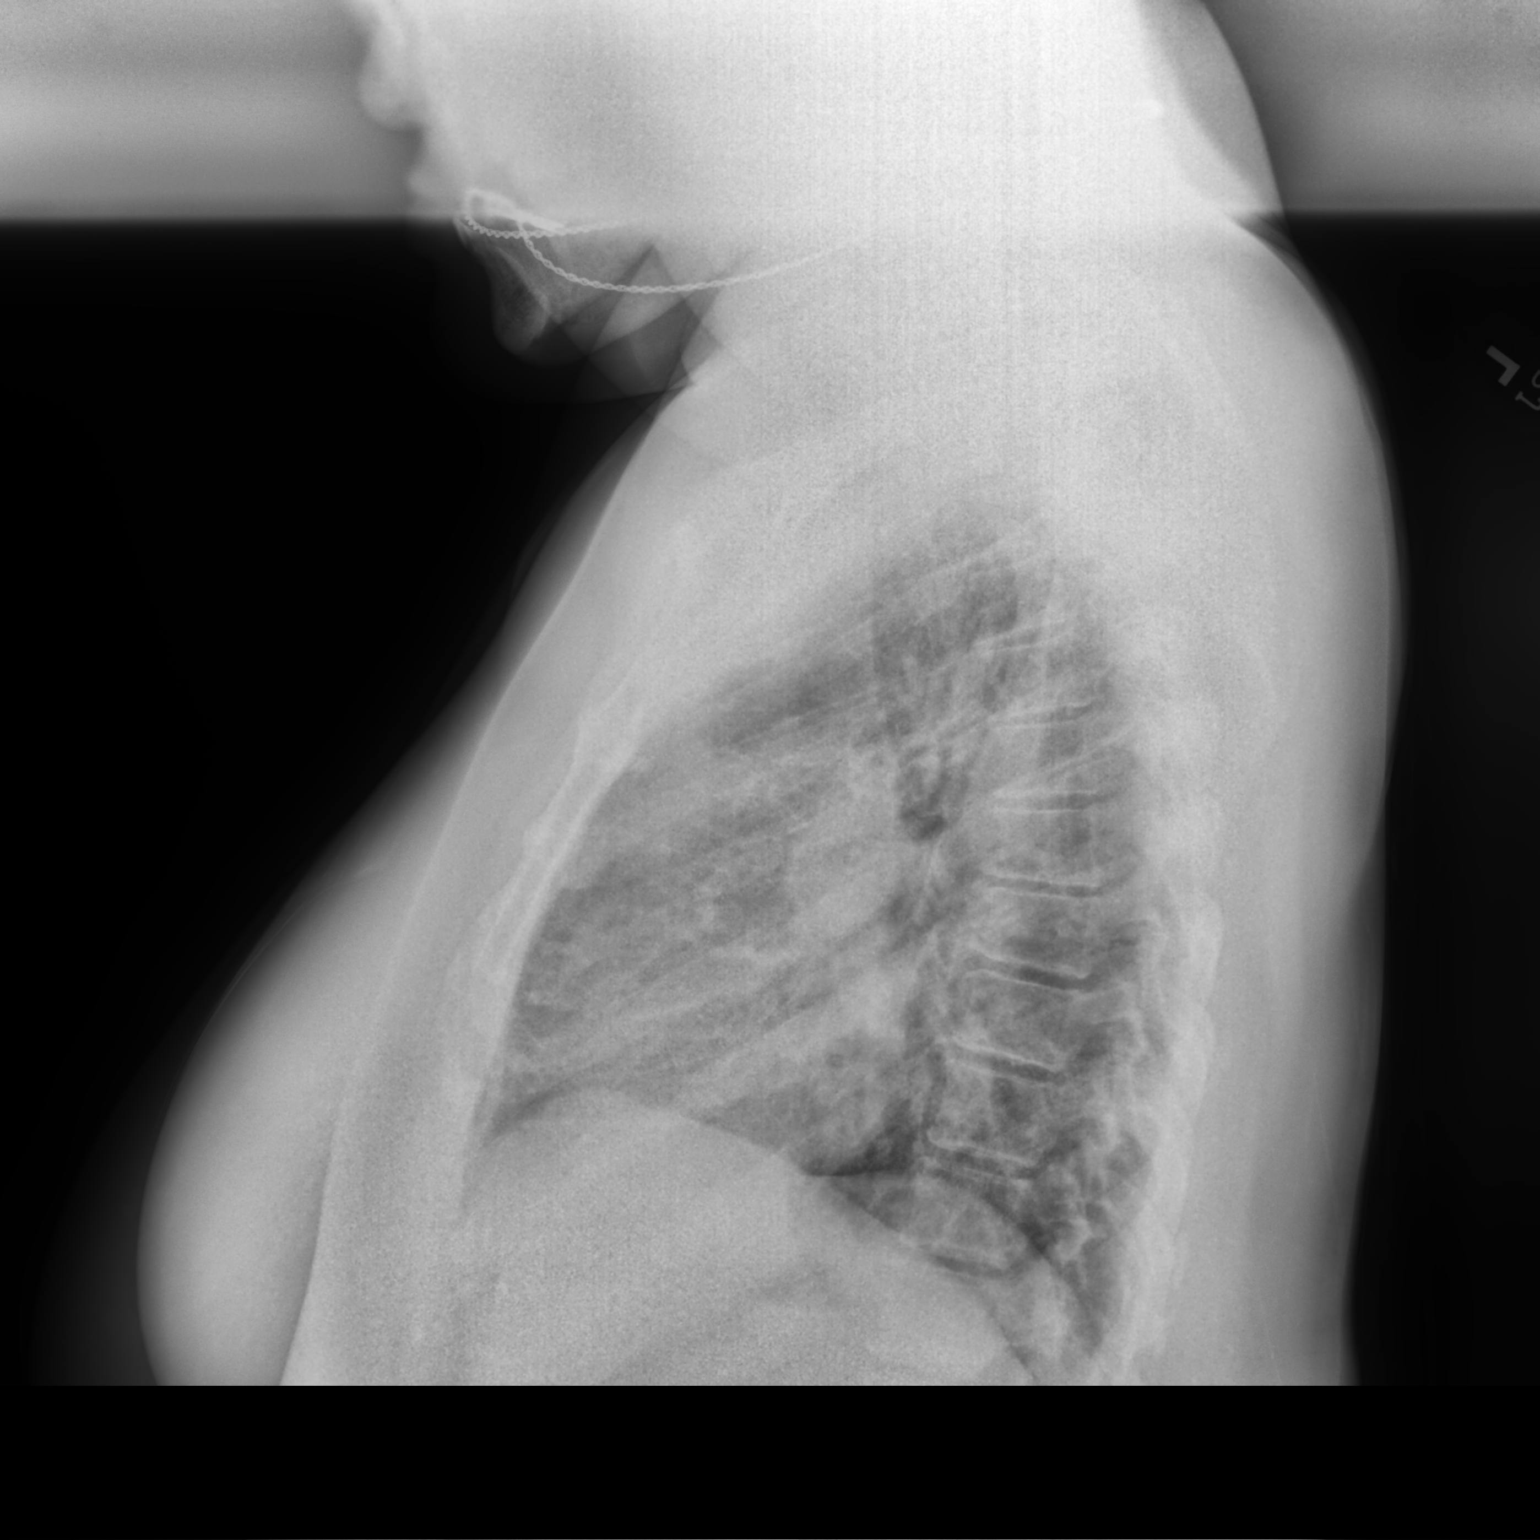

[2 of 2 positions shown; findings below may reference images not displayed]

FINDINGS: The heart size and mediastinal contours are within normal limits.
Stable coarse interstitial densities are noted bilaterally
consistent with history of sarcoidosis. No definite acute
abnormality is noted. The visualized skeletal structures are
unremarkable.
IMPRESSION: Stable coarse interstitial densities are noted consistent with
sarcoidosis. No acute abnormality is noted.

## 2023-08-15 NOTE — Progress Notes (Deleted)
 Office Visit Note  Patient: Hailey Ross             Date of Birth: 05/13/1970           MRN: [id number]             PCP: Zarwolo, Gloria, FNP Referring: Zarwolo, Gloria, FNP Visit Date: 08/29/2023 Occupation: @GUAROCC @  Subjective:    History of Present Illness: Hailey Ross is a 53 y.o. female with history of sarcoidosis.  She is taking methotrexate  4 tablets p.o. weekly along with folic acid  1 mg p.o. daily prescribed by Dr. Matilde Son.    Hepatic function panel WNL on 04/25/23.   CBC and CMP updated today.  Discussed the importance of holding methotrexate  if she develops signs or symptoms of an infection and to resume once the infection has completely cleared.   Activities of Daily Living:  Patient reports morning stiffness for *** {minute/hour:19697}.   Patient {ACTIONS;DENIES/REPORTS:21021675::"Denies"} nocturnal pain.  Difficulty dressing/grooming: {ACTIONS;DENIES/REPORTS:21021675::"Denies"} Difficulty climbing stairs: {ACTIONS;DENIES/REPORTS:21021675::"Denies"} Difficulty getting out of chair: {ACTIONS;DENIES/REPORTS:21021675::"Denies"} Difficulty using hands for taps, buttons, cutlery, and/or writing: {ACTIONS;DENIES/REPORTS:21021675::"Denies"}  No Rheumatology ROS completed.   PMFS History:  Patient Active Problem List   Diagnosis Date Noted   Asthmatic bronchitis , chronic (HCC) 06/12/2023   Abscess 05/26/2023   Prediabetes 02/20/2023   Multiple cysts of breast 02/17/2023   Overactive bladder 01/18/2023   Chronic anticoagulation 12/06/2022   Epistaxis 12/06/2022   Acute frontal sinusitis 12/06/2022   Acute respiratory failure with hypoxia (HCC) 12/05/2022   Gastroparesis 11/29/2022   Colitis 10/18/2022   Other constipation 10/04/2022   Upper abdominal pain 04/29/2022   Early satiety 04/29/2022   Acute bronchitis 03/21/2022   Immunization due 12/17/2021   Need for immunization against influenza 12/17/2021   Fatigue 12/17/2021   Encounter for annual  general medical examination with abnormal findings in adult 08/30/2021   CAP (community acquired pneumonia) 06/16/2021   Acute midline low back pain without sciatica 04/22/2021   Left lower quadrant abdominal pain 04/20/2021   Atypical chest pain 01/26/2021   Synovitis of left knee 01/19/2021   GERD (gastroesophageal reflux disease) 12/29/2020   Abdominal pain, epigastric 12/28/2020   Primary osteoarthritis of left knee 10/27/2020   Grief at loss of child 06/09/2020   Insomnia 06/09/2020   Right ankle swelling 02/05/2020   Encounter for examination following treatment at hospital 01/28/2020   Palpitations 01/28/2020   Menorrhagia with regular cycle 10/15/2019   OSA (obstructive sleep apnea) 10/08/2019   Right knee pain 08/29/2019   Positive ANA (antinuclear antibody) 08/29/2019   Essential hypertension 07/04/2019   Pain and swelling of right knee 07/04/2019   Hyperlipidemia 07/04/2019   Stage 2 chronic kidney disease 07/04/2019   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   Encounter for support and coordination of transition of care 07/04/2019   Daytime hypersomnolence 07/02/2019   Pulmonary hypertension (HCC) 07/02/2019   CKD (chronic kidney disease)    Bilateral pulmonary embolism (HCC) 06/18/2019   Lightheadedness 05/10/2019   Vitamin D  deficiency 05/10/2019   Obesity (BMI 30-39.9) 04/16/2019   Sarcoidosis 05/04/2015   Bronchiectasis (HCC) 05/04/2015    Past Medical History:  Diagnosis Date   Acute cholecystitis 08/24/2016   Acute kidney injury superimposed on chronic kidney disease (HCC) 06/20/2019   Acute respiratory failure with hypoxia (HCC) 06/19/2019   Annual visit for general adult medical examination with abnormal findings 04/16/2019   Arthritis    right knee   Bronchiectasis (HCC)    CKD (chronic kidney  disease)    Iron deficiency    Lightheadedness 05/10/2019   PE (pulmonary thromboembolism) (HCC) 06/2019   Pre-syncope 04/16/2019   Sarcoidosis    Sarcoidosis    Sleep  apnea    c pap   Smoker 02/12/2014    Family History  Problem Relation Age of Onset   Hypertension Mother    Cancer Mother    Colon polyps Mother    Healthy Sister    Healthy Sister    Healthy Sister    Healthy Sister    Healthy Son    Healthy Son    Healthy Daughter    Colon cancer Neg Hx    Esophageal cancer Neg Hx    Rectal cancer Neg Hx    Stomach cancer Neg Hx    Past Surgical History:  Procedure Laterality Date   CHOLECYSTECTOMY N/A 08/25/2016   Procedure: LAPAROSCOPIC CHOLECYSTECTOMY;  Surgeon: Oza Blumenthal, MD;  Location: [facility] OR;  Service: General;  Laterality: N/A;   TUBAL LIGATION  02/1993   Social History   Social History Narrative   Live with partners [name]-19 years   3 children.    1-[name] (girl) 27 at home with her: 2 grandchildren in her home as well    [name]- 26 expecting   [name]-25      Enjoy: read, play games on tablet, grandkids      Diet: Veggies, does not eat a lot of fried foods, enjoys chicken and fruit   Caffeine: sodas and coffee (with sugar)   Water: Does not drink a lot at all      Wears seat beat   Does not wear sunscreen   Smoke and carbon monoxide detectors   Does not use phone while driving    Immunization History  Administered Date(s) Administered   Influenza Inj Mdck Quad Pf 12/25/2019   Influenza,inj,Quad PF,6+ Mos 12/21/2018, 02/22/2021, 12/17/2021   Influenza,inj,Quad PF,6-35 Mos 01/10/2019   Influenza-Unspecified 12/20/2022   PFIZER(Purple Top)SARS-COV-2 Vaccination 08/14/2019, 09/10/2019, 02/07/2020   Pneumococcal Polysaccharide-23 02/12/2014   Tdap 02/12/2014   Zoster Recombinant(Shingrix ) 12/17/2021, 10/18/2022     Objective: Vital Signs: LMP 04/01/2021 (Exact Date)    Physical Exam Vitals and nursing note reviewed.  Constitutional:      Appearance: She is well-developed.  HENT:     Head: Normocephalic and atraumatic.  Eyes:     Conjunctiva/sclera: Conjunctivae normal.  Cardiovascular:     Rate and  Rhythm: Normal rate and regular rhythm.     Heart sounds: Normal heart sounds.  Pulmonary:     Effort: Pulmonary effort is normal.     Breath sounds: Normal breath sounds.  Abdominal:     General: Bowel sounds are normal.     Palpations: Abdomen is soft.  Musculoskeletal:     Cervical back: Normal range of motion.  Lymphadenopathy:     Cervical: No cervical adenopathy.  Skin:    General: Skin is warm and dry.     Capillary Refill: Capillary refill takes less than 2 seconds.  Neurological:     Mental Status: She is alert and oriented to person, place, and time.  Psychiatric:        Behavior: Behavior normal.      Musculoskeletal Exam: ***  CDAI Exam: CDAI Score: -- Patient Global: --; Provider Global: -- Swollen: --; Tender: -- Joint Exam 08/29/2023   No joint exam has been documented for this visit   There is currently no information documented on the homunculus. Go to the Rheumatology activity  and complete the homunculus joint exam.  Investigation: No additional findings.  Imaging: No results found.  Recent Labs: Lab Results  Component Value Date   WBC 12.0 (H) 01/17/2023   HGB 13.0 01/17/2023   PLT 374 01/17/2023   NA 146 (H) 01/17/2023   K 4.6 01/17/2023   CL 107 (H) 01/17/2023   CO2 21 01/17/2023   GLUCOSE 102 (H) 01/17/2023   BUN 17 01/17/2023   CREATININE 1.07 (H) 01/17/2023   BILITOT 0.4 04/25/2023   ALKPHOS 98 04/25/2023   AST 23 04/25/2023   ALT 23 04/25/2023   PROT 7.4 04/25/2023   ALBUMIN  4.1 04/25/2023   CALCIUM  9.2 01/17/2023   GFRAA 62 09/23/2020   QFTBGOLD Negative 05/01/2015   QFTBGOLDPLUS NEGATIVE 11/08/2019    Speciality Comments: No specialty comments available.  Procedures:  No procedures performed Allergies: Patient has no known allergies.   Assessment / Plan:     Visit Diagnoses: No diagnosis found.  Orders: No orders of the defined types were placed in this encounter.  No orders of the defined types were placed in  this encounter.   Face-to-face time spent with patient was *** minutes. Greater than 50% of time was spent in counseling and coordination of care.  Follow-Up Instructions: No follow-ups on file.   Dee Farber, CMA  Note - This record has been created using Animal nutritionist.  Chart creation errors have been sought, but may not always  have been located. Such creation errors do not reflect on  the standard of medical care.

## 2023-08-29 ENCOUNTER — Ambulatory Visit: Admitting: Physician Assistant

## 2023-08-29 DIAGNOSIS — E782 Mixed hyperlipidemia: Secondary | ICD-10-CM

## 2023-08-29 DIAGNOSIS — G4733 Obstructive sleep apnea (adult) (pediatric): Secondary | ICD-10-CM

## 2023-08-29 DIAGNOSIS — N1831 Chronic kidney disease, stage 3a: Secondary | ICD-10-CM

## 2023-08-29 DIAGNOSIS — G8929 Other chronic pain: Secondary | ICD-10-CM

## 2023-08-29 DIAGNOSIS — I10 Essential (primary) hypertension: Secondary | ICD-10-CM

## 2023-08-29 DIAGNOSIS — I2699 Other pulmonary embolism without acute cor pulmonale: Secondary | ICD-10-CM

## 2023-08-29 DIAGNOSIS — G4719 Other hypersomnia: Secondary | ICD-10-CM

## 2023-08-29 DIAGNOSIS — Z79899 Other long term (current) drug therapy: Secondary | ICD-10-CM

## 2023-08-29 DIAGNOSIS — M25462 Effusion, left knee: Secondary | ICD-10-CM

## 2023-08-29 DIAGNOSIS — M7061 Trochanteric bursitis, right hip: Secondary | ICD-10-CM

## 2023-08-29 DIAGNOSIS — M19041 Primary osteoarthritis, right hand: Secondary | ICD-10-CM

## 2023-08-29 DIAGNOSIS — R768 Other specified abnormal immunological findings in serum: Secondary | ICD-10-CM

## 2023-08-29 DIAGNOSIS — N2581 Secondary hyperparathyroidism of renal origin: Secondary | ICD-10-CM

## 2023-08-29 DIAGNOSIS — I272 Pulmonary hypertension, unspecified: Secondary | ICD-10-CM

## 2023-08-29 DIAGNOSIS — D869 Sarcoidosis, unspecified: Secondary | ICD-10-CM

## 2023-08-29 DIAGNOSIS — E559 Vitamin D deficiency, unspecified: Secondary | ICD-10-CM

## 2023-08-29 DIAGNOSIS — J479 Bronchiectasis, uncomplicated: Secondary | ICD-10-CM

## 2023-08-30 NOTE — Progress Notes (Signed)
 Office Visit Note  Patient: Hailey Ross             Date of Birth: [birth year]           MRN: [id number]             PCP: Zarwolo, Gloria, FNP Referring: Zarwolo, Gloria, FNP Visit Date: 09/01/2023 Occupation: @GUAROCC @  Subjective:  Left knee pain   History of Present Illness: ANEYAH LORTZ is a 53 y.o. female with history of sarcoidosis.  Patient was previously taking methotrexate  4 tablets by mouth once weekly and folic acid  1 mg daily prescribed by Dr. Matilde Son.  She is no longer under the care of Dr. Matilde Son but has established care with Dr. Bertrum Brodie.  Patient states that she ran out of her prescription for methotrexate  in February 2025.  She had previously tolerated methotrexate  without any side effects.  She has not had any recent or recurrent infections.  Patient had not noticed any changes in her symptoms while taking methotrexate .  Patient continues to experience intermittent pain and stiffness involving the left knee.  She has intermittent swelling in the left knee at times.  Patient requested a left knee joint cortisone injection today.  She denies any other joint pain or joint swelling at this time.  She denies any recent rashes.  She denies any new or worsening pulmonary symptoms.   Activities of Daily Living:  Patient reports morning stiffness for 15 minutes.   Patient Denies nocturnal pain.  Difficulty dressing/grooming: Denies Difficulty climbing stairs: Reports Difficulty getting out of chair: Denies Difficulty using hands for taps, buttons, cutlery, and/or writing: Denies  Review of Systems  Constitutional:  Negative for fatigue.  HENT:  Negative for mouth sores and mouth dryness.   Eyes:  Negative for dryness.  Respiratory:  Negative for shortness of breath.   Cardiovascular:  Negative for chest pain and palpitations.  Gastrointestinal:  Negative for blood in stool, constipation and diarrhea.  Endocrine: Negative for increased urination.  Genitourinary:   Negative for involuntary urination.  Musculoskeletal:  Positive for joint pain, joint pain, joint swelling and morning stiffness. Negative for gait problem, myalgias, muscle weakness, muscle tenderness and myalgias.  Skin:  Negative for color change, rash, hair loss and sensitivity to sunlight.  Allergic/Immunologic: Negative for susceptible to infections.  Neurological:  Negative for dizziness and headaches.  Hematological:  Negative for swollen glands.  Psychiatric/Behavioral:  Negative for depressed mood and sleep disturbance. The patient is not nervous/anxious.     PMFS History:  Patient Active Problem List   Diagnosis Date Noted   Asthmatic bronchitis , chronic (HCC) 06/12/2023   Abscess 05/26/2023   Prediabetes 02/20/2023   Multiple cysts of breast 02/17/2023   Overactive bladder 01/18/2023   Chronic anticoagulation 12/06/2022   Epistaxis 12/06/2022   Acute frontal sinusitis 12/06/2022   Acute respiratory failure with hypoxia (HCC) 12/05/2022   Gastroparesis 11/29/2022   Colitis 10/18/2022   Other constipation 10/04/2022   Upper abdominal pain 04/29/2022   Early satiety 04/29/2022   Acute bronchitis 03/21/2022   Immunization due 12/17/2021   Need for immunization against influenza 12/17/2021   Fatigue 12/17/2021   Encounter for annual general medical examination with abnormal findings in adult 08/30/2021   CAP (community acquired pneumonia) 06/16/2021   Acute midline low back pain without sciatica 04/22/2021   Left lower quadrant abdominal pain 04/20/2021   Atypical chest pain 01/26/2021   Synovitis of left knee 01/19/2021   GERD (gastroesophageal reflux disease) 12/29/2020  Abdominal pain, epigastric 12/28/2020   Primary osteoarthritis of left knee 10/27/2020   Grief at loss of child 06/09/2020   Insomnia 06/09/2020   Right ankle swelling 02/05/2020   Encounter for examination following treatment at hospital 01/28/2020   Palpitations 01/28/2020   Menorrhagia with  regular cycle 10/15/2019   OSA (obstructive sleep apnea) 10/08/2019   Right knee pain 08/29/2019   Positive ANA (antinuclear antibody) 08/29/2019   Essential hypertension 07/04/2019   Pain and swelling of right knee 07/04/2019   Hyperlipidemia 07/04/2019   Stage 2 chronic kidney disease 07/04/2019   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   Encounter for support and coordination of transition of care 07/04/2019   Daytime hypersomnolence 07/02/2019   Pulmonary hypertension (HCC) 07/02/2019   CKD (chronic kidney disease)    Bilateral pulmonary embolism (HCC) 06/18/2019   Lightheadedness 05/10/2019   Vitamin D  deficiency 05/10/2019   Obesity (BMI 30-39.9) 04/16/2019   Sarcoidosis 05/04/2015   Bronchiectasis (HCC) 05/04/2015    Past Medical History:  Diagnosis Date   Acute cholecystitis 08/24/2016   Acute kidney injury superimposed on chronic kidney disease (HCC) 06/20/2019   Acute respiratory failure with hypoxia (HCC) 06/19/2019   Annual visit for general adult medical examination with abnormal findings 04/16/2019   Arthritis    right knee   Bronchiectasis (HCC)    CKD (chronic kidney disease)    Iron deficiency    Lightheadedness 05/10/2019   PE (pulmonary thromboembolism) (HCC) 06/2019   Pre-syncope 04/16/2019   Sarcoidosis    Sarcoidosis    Sleep apnea    c pap   Smoker 02/12/2014    Family History  Problem Relation Age of Onset   Hypertension Mother    Cancer Mother    Colon polyps Mother    Healthy Sister    Healthy Sister    Healthy Sister    Healthy Sister    Healthy Daughter    Healthy Son    Healthy Son    Colon cancer Neg Hx    Esophageal cancer Neg Hx    Rectal cancer Neg Hx    Stomach cancer Neg Hx    Past Surgical History:  Procedure Laterality Date   CHOLECYSTECTOMY N/A 08/25/2016   Procedure: LAPAROSCOPIC CHOLECYSTECTOMY;  Surgeon: Oza Blumenthal, MD;  Location: [facility] OR;  Service: General;  Laterality: N/A;   TUBAL LIGATION  02/1993   Social History    Social History Narrative   Live with partners [name]-19 years   3 children.    1-[name] (girl) 27 at home with her: 2 grandchildren in her home as well    [name]- 26 expecting   [name]-25      Enjoy: read, play games on tablet, grandkids      Diet: Veggies, does not eat a lot of fried foods, enjoys chicken and fruit   Caffeine: sodas and coffee (with sugar)   Water: Does not drink a lot at all      Wears seat beat   Does not wear sunscreen   Smoke and carbon monoxide detectors   Does not use phone while driving    Immunization History  Administered Date(s) Administered   Influenza Inj Mdck Quad Pf 12/25/2019   Influenza,inj,Quad PF,6+ Mos 12/21/2018, 02/22/2021, 12/17/2021   Influenza,inj,Quad PF,6-35 Mos 01/10/2019   Influenza-Unspecified 12/20/2022   PFIZER(Purple Top)SARS-COV-2 Vaccination 08/14/2019, 09/10/2019, 02/07/2020   Pneumococcal Polysaccharide-23 02/12/2014   Tdap 02/12/2014   Zoster Recombinant(Shingrix ) 12/17/2021, 10/18/2022     Objective: Vital Signs: BP 132/89 (BP Location:  Left Arm, Patient Position: Sitting, Cuff Size: Normal)   Pulse (!) 59   Resp 17   Ht 5\' 5"  (1.651 m)   Wt 227 lb (103 kg)   LMP 04/01/2021 (Exact Date)   BMI 37.77 kg/m    Physical Exam Vitals and nursing note reviewed.  Constitutional:      Appearance: She is well-developed.  HENT:     Head: Normocephalic and atraumatic.  Eyes:     Conjunctiva/sclera: Conjunctivae normal.  Cardiovascular:     Rate and Rhythm: Normal rate and regular rhythm.     Heart sounds: Normal heart sounds.  Pulmonary:     Effort: Pulmonary effort is normal.     Breath sounds: Normal breath sounds.  Abdominal:     General: Bowel sounds are normal.     Palpations: Abdomen is soft.  Musculoskeletal:     Cervical back: Normal range of motion.  Lymphadenopathy:     Cervical: No cervical adenopathy.  Skin:    General: Skin is warm and dry.     Capillary Refill: Capillary refill takes less than  2 seconds.  Neurological:     Mental Status: She is alert and oriented to person, place, and time.  Psychiatric:        Behavior: Behavior normal.      Musculoskeletal Exam: C-spine, thoracic spine, lumbar spine good range of motion.  Shoulder joints, elbow joints, wrist joints, MCPs, PIPs, DIPs have good range of motion with no synovitis.  Complete fist formation bilaterally.  Hip joints have good range of motion with no groin pain.  Both knee joints have good range of motion with discomfort in the left knee and mild swelling noted.  Ankle joints have good range of motion with no tenderness or joint swelling.  No tenderness or synovitis over MTP joints.  CDAI Exam: CDAI Score: -- Patient Global: --; Provider Global: -- Swollen: --; Tender: -- Joint Exam 09/01/2023   No joint exam has been documented for this visit   There is currently no information documented on the homunculus. Go to the Rheumatology activity and complete the homunculus joint exam.  Investigation: No additional findings.  Imaging: No results found.  Recent Labs: Lab Results  Component Value Date   WBC 12.0 (H) 01/17/2023   HGB 13.0 01/17/2023   PLT 374 01/17/2023   NA 146 (H) 01/17/2023   K 4.6 01/17/2023   CL 107 (H) 01/17/2023   CO2 21 01/17/2023   GLUCOSE 102 (H) 01/17/2023   BUN 17 01/17/2023   CREATININE 1.07 (H) 01/17/2023   BILITOT 0.4 04/25/2023   ALKPHOS 98 04/25/2023   AST 23 04/25/2023   ALT 23 04/25/2023   PROT 7.4 04/25/2023   ALBUMIN  4.1 04/25/2023   CALCIUM  9.2 01/17/2023   GFRAA 62 09/23/2020   QFTBGOLD Negative 05/01/2015   QFTBGOLDPLUS NEGATIVE 11/08/2019    Speciality Comments: No specialty comments available.  Procedures:  Large Joint Inj: L knee on 09/01/2023 9:16 AM Indications: pain Details: 27 G 1.5 in needle, medial approach  Arthrogram: No  Medications: 1.5 mL lidocaine  1 %; 40 mg triamcinolone  acetonide 40 MG/ML Aspirate: 0 mL Outcome: tolerated well, no  immediate complications Procedure, treatment alternatives, risks and benefits explained, specific risks discussed. Consent was given by the patient. Immediately prior to procedure a time out was called to verify the correct patient, procedure, equipment, support staff and site/side marked as required. Patient was prepped and draped in the usual sterile fashion.     Allergies:  Patient has no known allergies.   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 Matilde Son in 2020.  Patient was evaluated by Arsenio Bigger in July 2024--started on methotrexate  due to progressive fibrotic changes noted on CT.  Patient had consistently been taking methotrexate  from September 2024 through February 2025 but ran out of the prescription.  She has established care with Dr. Bertrum Brodie but has not received a refill--reviewed Dr. Mardell Shade office visit note from 07/31/2023-progressive pulmonary fibrosis since 2017 but stable from  2021-2025-recommended continuing methotrexate  and folic acid .  Recommended repeating PFTs and high-resolution chest CT in fall 2025.  Patient had a chest x-ray on 06/12/2023 which revealed chronic subpleural reticulation and interstitial coarsening, not significantly changed.  Order for high-resolution chest CT is in place.  She has not noticed any new or worsening pulmonary symptoms. No EN lesions or other skin changes noted. She was previously tolerating methotrexate  without any side effects or recurrent infections.  Plan to update CBC and CMP today.  A refill for methotrexate  and folic acid  can be sent to the pharmacy pending lab results. She was advised to notify us  if she develops any new or worsening symptoms.  She will follow-up in the office in 5 to 6 months.  Positive ANA (antinuclear antibody) - Low titer positive ANA, ENA negative, anticardiolipin negative, beta-2 GP 1 negative, lupus anticoagulant negative (lupus anticoagulant was  positive once).  High risk medication use - Methotrexate  4 tablets p.o. weekly along with folic acid  1 mg p.o. daily-started by pulmonology.  Hepatic function panel WNL on 04/25/23.   CBC and CMP updated today.  Discussed that she will continue to require updated lab work every 3 months while taking methotrexate . No recent or recurrent infections.  Discussed the importance of holding methotrexate  if she develops signs or symptoms of an infection and to resume once the infection has completely cleared.   - Plan: CBC with Differential/Platelet, Comprehensive metabolic panel with GFR  Primary osteoarthritis of both hands - X-rays of both hands were obtained on 11/08/2019 which were consistent with osteoarthritis.  No erosive changes were noted.  No synovitis was noted on examination today.  Trochanteric bursitis of both hips: Intermittent discomfort.  Effusion, left knee - Followed by Dr. Christiane Cowing.  X-rays of the left knee were updated on 01/28/2022 which were consistent with mild osteoarthritis and minimal periarticular spurring.  Patient presents today with a recurrence of pain and stiffness involving the left knee.  Mild swelling was noted on examination today.  The patient requested a repeat left knee joint cortisone injection today.  She tolerated procedure well.  Procedure note was completed above.  Aftercare was discussed.  She was advised to notify us  if her symptoms persist or worsen.  Chronic pain of left knee - Recurrent left knee joint effusion in the past.  Patient presented today with a recurrence of pain in the left knee.  She has been experiencing stiffness and intermittent joint swelling.  She had a left knee joint cortisone injection performed on 08/30/2022 which was significant relief.  On examination she has discomfort with range of motion and mild swelling.  Different treatment options were discussed.  Patient requested a repeat left knee joint cortisone injection today.  She tolerated  procedure well.  Procedure note completed above.  Aftercare was discussed.  She was advised to notify us  if her symptoms persist or worsen.  Other medical conditions are listed as follows:   Stage  3a chronic kidney disease (HCC) - Followed by Dr. Carrolyn Clan.  CKD since 2017.  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 Mcarthur Speedy office visit note on 09/15/21. CMP With GFR updated today.   Pulmonary hypertension (HCC)  Bilateral pulmonary embolism (HCC) - Unprovoked.  She is on anticoagulation-xarelto  lifelong.  She is followed by hematology.  Essential hypertension: Blood pressure was slightly elevated today in the office and was rechecked prior to administering the cortisone injection.  Patient was advised to monitor her blood pressure closely following the cortisone injection today.  Bronchiectasis without complication (HCC) - Under the care of Dr. Matilde Son.  Mixed hyperlipidemia  Secondary hyperparathyroidism (HCC)  Vitamin D  deficiency  OSA (obstructive sleep apnea) - Under the care of Dr. Matilde Son.  Daytime hypersomnolence  Orders: Orders Placed This Encounter  Procedures   Large Joint Inj   CBC with Differential/Platelet   Comprehensive metabolic panel with GFR   No orders of the defined types were placed in this encounter.    Follow-Up Instructions: Return in about 5 months (around 02/01/2024) for Sarcoidosis.   Romayne Clubs, PA-C  Note - This record has been created using Dragon software.  Chart creation errors have been sought, but may not always  have been located. Such creation errors do not reflect on  the standard of medical care.

## 2023-09-01 ENCOUNTER — Ambulatory Visit: Attending: Physician Assistant | Admitting: Physician Assistant

## 2023-09-01 ENCOUNTER — Encounter: Payer: Self-pay | Admitting: Physician Assistant

## 2023-09-01 VITALS — BP 132/89 | HR 59 | Resp 17 | Ht 65.0 in | Wt 227.0 lb

## 2023-09-01 DIAGNOSIS — N1831 Chronic kidney disease, stage 3a: Secondary | ICD-10-CM | POA: Diagnosis not present

## 2023-09-01 DIAGNOSIS — M25462 Effusion, left knee: Secondary | ICD-10-CM | POA: Diagnosis not present

## 2023-09-01 DIAGNOSIS — D869 Sarcoidosis, unspecified: Secondary | ICD-10-CM

## 2023-09-01 DIAGNOSIS — M25562 Pain in left knee: Secondary | ICD-10-CM | POA: Diagnosis not present

## 2023-09-01 DIAGNOSIS — M7062 Trochanteric bursitis, left hip: Secondary | ICD-10-CM | POA: Diagnosis not present

## 2023-09-01 DIAGNOSIS — M19041 Primary osteoarthritis, right hand: Secondary | ICD-10-CM

## 2023-09-01 DIAGNOSIS — I272 Pulmonary hypertension, unspecified: Secondary | ICD-10-CM | POA: Diagnosis not present

## 2023-09-01 DIAGNOSIS — N2581 Secondary hyperparathyroidism of renal origin: Secondary | ICD-10-CM

## 2023-09-01 DIAGNOSIS — G4733 Obstructive sleep apnea (adult) (pediatric): Secondary | ICD-10-CM

## 2023-09-01 DIAGNOSIS — I10 Essential (primary) hypertension: Secondary | ICD-10-CM

## 2023-09-01 DIAGNOSIS — M19042 Primary osteoarthritis, left hand: Secondary | ICD-10-CM | POA: Insufficient documentation

## 2023-09-01 DIAGNOSIS — E559 Vitamin D deficiency, unspecified: Secondary | ICD-10-CM | POA: Diagnosis not present

## 2023-09-01 DIAGNOSIS — Z79899 Other long term (current) drug therapy: Secondary | ICD-10-CM

## 2023-09-01 DIAGNOSIS — M7061 Trochanteric bursitis, right hip: Secondary | ICD-10-CM

## 2023-09-01 DIAGNOSIS — J479 Bronchiectasis, uncomplicated: Secondary | ICD-10-CM | POA: Diagnosis not present

## 2023-09-01 DIAGNOSIS — G8929 Other chronic pain: Secondary | ICD-10-CM

## 2023-09-01 DIAGNOSIS — I2699 Other pulmonary embolism without acute cor pulmonale: Secondary | ICD-10-CM | POA: Diagnosis not present

## 2023-09-01 DIAGNOSIS — G4719 Other hypersomnia: Secondary | ICD-10-CM

## 2023-09-01 DIAGNOSIS — E782 Mixed hyperlipidemia: Secondary | ICD-10-CM | POA: Diagnosis not present

## 2023-09-01 DIAGNOSIS — R768 Other specified abnormal immunological findings in serum: Secondary | ICD-10-CM

## 2023-09-01 MED ORDER — TRIAMCINOLONE ACETONIDE 40 MG/ML IJ SUSP
40.0000 mg | INTRAMUSCULAR | Status: AC | PRN
  Administered 2023-09-01: 40 mg via INTRA_ARTICULAR

## 2023-09-01 MED ORDER — LIDOCAINE HCL 1 % IJ SOLN
1.5000 mL | INTRAMUSCULAR | Status: AC | PRN
  Administered 2023-09-01: 1.5 mL

## 2023-09-02 LAB — COMPREHENSIVE METABOLIC PANEL WITH GFR
AG Ratio: 1.3 (calc) (ref 1.0–2.5)
ALT: 14 U/L (ref 6–29)
AST: 21 U/L (ref 10–35)
Albumin: 3.9 g/dL (ref 3.6–5.1)
Alkaline phosphatase (APISO): 92 U/L (ref 37–153)
BUN/Creatinine Ratio: 12 (calc) (ref 6–22)
BUN: 15 mg/dL (ref 7–25)
CO2: 27 mmol/L (ref 20–32)
Calcium: 9.4 mg/dL (ref 8.6–10.4)
Chloride: 107 mmol/L (ref 98–110)
Creat: 1.23 mg/dL — ABNORMAL HIGH (ref 0.50–1.03)
Globulin: 3.1 g/dL (ref 1.9–3.7)
Glucose, Bld: 86 mg/dL (ref 65–99)
Potassium: 4.8 mmol/L (ref 3.5–5.3)
Sodium: 141 mmol/L (ref 135–146)
Total Bilirubin: 0.4 mg/dL (ref 0.2–1.2)
Total Protein: 7 g/dL (ref 6.1–8.1)
eGFR: 53 mL/min/{1.73_m2} — ABNORMAL LOW (ref >=60)

## 2023-09-02 LAB — CBC WITH DIFFERENTIAL/PLATELET
Absolute Lymphocytes: 3211 {cells}/uL (ref 850–3900)
Absolute Monocytes: 589 {cells}/uL (ref 200–950)
Basophils Absolute: 57 {cells}/uL (ref 0–200)
Basophils Relative: 0.6 %
Eosinophils Absolute: 247 {cells}/uL (ref 15–500)
Eosinophils Relative: 2.6 %
HCT: 41.6 % (ref 35.0–45.0)
Hemoglobin: 12.9 g/dL (ref 11.7–15.5)
MCH: 26.8 pg — ABNORMAL LOW (ref 27.0–33.0)
MCHC: 31 g/dL — ABNORMAL LOW (ref 32.0–36.0)
MCV: 86.3 fL (ref 80.0–100.0)
MPV: 9.9 fL (ref 7.5–12.5)
Monocytes Relative: 6.2 %
Neutro Abs: 5396 {cells}/uL (ref 1500–7800)
Neutrophils Relative %: 56.8 %
Platelets: 325 10*3/uL (ref 140–400)
RBC: 4.82 10*6/uL (ref 3.80–5.10)
RDW: 13.5 % (ref 11.0–15.0)
Total Lymphocyte: 33.8 %
WBC: 9.5 10*3/uL (ref 3.8–10.8)

## 2023-09-05 ENCOUNTER — Ambulatory Visit: Payer: Self-pay | Admitting: Physician Assistant

## 2023-09-05 NOTE — Progress Notes (Signed)
 CBC stable.  Creatinine is elevated-1.23 and GFR is low-53.   Patient reported being off of methotrexate  since February 2025.  Last 30-day supply was dispensed in September 2024.  Please clarify if she has been taking any NSAIDs? Any other medication changes?  Reviewed lab work with Dr. Michele Ahle to restart methotrexate  due to elevation of creatinine.  Plan to recheck out to Dr. Bertrum Brodie to discuss treatment alternatives.

## 2023-09-20 ENCOUNTER — Ambulatory Visit (INDEPENDENT_AMBULATORY_CARE_PROVIDER_SITE_OTHER): Payer: Self-pay

## 2023-09-20 VITALS — BP 135/88 | HR 68 | Ht 65.0 in | Wt 224.1 lb

## 2023-09-20 DIAGNOSIS — E559 Vitamin D deficiency, unspecified: Secondary | ICD-10-CM

## 2023-09-20 DIAGNOSIS — R5383 Other fatigue: Secondary | ICD-10-CM

## 2023-09-20 DIAGNOSIS — R7303 Prediabetes: Secondary | ICD-10-CM | POA: Diagnosis not present

## 2023-09-20 NOTE — Progress Notes (Signed)
   Acute Office Visit  Subjective:     Patient ID: Hailey Ross, female    DOB: 1970-10-19, 53 y.o.   MRN: [id number]  Chief Complaint  Patient presents with   Medical Management of Chronic Issues    Pt states feels tired all of the time, having legs cramps at night    HPI Patient is in today for fatigue and cramping in legs for past 1-2 months          ROS      Objective:    BP 135/88   Pulse 68   Ht 5' 5 (1.651 m)   Wt 224 lb 1.9 oz (101.7 kg)   LMP 04/01/2021 (Exact Date)   SpO2 (!) 89%   BMI 37.30 kg/m    Physical Exam Vitals and nursing note reviewed.  Constitutional:      Appearance: Normal appearance.  HENT:     Head: Normocephalic.     Right Ear: Tympanic membrane, ear canal and external ear normal.     Left Ear: Tympanic membrane, ear canal and external ear normal.     Nose: Nose normal.     Mouth/Throat:     Mouth: Mucous membranes are moist.     Pharynx: Oropharynx is clear.   Cardiovascular:     Rate and Rhythm: Normal rate and regular rhythm.  Pulmonary:     Effort: Pulmonary effort is normal.     Breath sounds: Normal breath sounds.   Musculoskeletal:        General: Normal range of motion.     Cervical back: Normal range of motion and neck supple.   Skin:    General: Skin is warm and dry.   Neurological:     Mental Status: She is alert and oriented to person, place, and time.   Psychiatric:        Mood and Affect: Mood normal.        Thought Content: Thought content normal.       Assessment & Plan:   Problem List Items Addressed This Visit       Other   Vitamin D  deficiency   She is taking weekly vitamin D  supplement.  Will recheck levels today.      Relevant Orders   Vitamin D  (25 hydroxy) (Completed)   Fatigue - Primary   Unremarkable exam.  Recent labs from last month were reviewed and unrevealing for any cause of fatigue.  Will check magnesium and iron levels for further evaluation.  Follow-up according to lab  results.       Relevant Orders   Magnesium (Completed)   Fe+TIBC+Fer (Completed)   Prediabetes   Recheck A1c levels.      Relevant Orders   HgB A1c (Completed)    No orders of the defined types were placed in this encounter.   No follow-ups on file.  Alison Irvine, FNP

## 2023-09-21 ENCOUNTER — Ambulatory Visit: Payer: Self-pay

## 2023-09-21 LAB — HEMOGLOBIN A1C
Est. average glucose Bld gHb Est-mCnc: 117 mg/dL
Hgb A1c MFr Bld: 5.7 % — ABNORMAL HIGH (ref 4.8–5.6)

## 2023-09-21 LAB — IRON,TIBC AND FERRITIN PANEL
Ferritin: 29 ng/mL (ref 15–150)
Iron Saturation: 11 % — ABNORMAL LOW (ref 15–55)
Iron: 42 ug/dL (ref 27–159)
Total Iron Binding Capacity: 367 ug/dL (ref 250–450)
UIBC: 325 ug/dL (ref 131–425)

## 2023-09-21 LAB — VITAMIN D 25 HYDROXY (VIT D DEFICIENCY, FRACTURES): Vit D, 25-Hydroxy: 31.2 ng/mL (ref 30.0–100.0)

## 2023-09-21 LAB — MAGNESIUM: Magnesium: 2.2 mg/dL (ref 1.6–2.3)

## 2023-09-21 NOTE — Assessment & Plan Note (Signed)
 Recheck A1c levels.

## 2023-09-21 NOTE — Assessment & Plan Note (Signed)
 Unremarkable exam.  Recent labs from last month were reviewed and unrevealing for any cause of fatigue.  Will check magnesium and iron levels for further evaluation.  Follow-up according to lab results.

## 2023-09-21 NOTE — Assessment & Plan Note (Signed)
 She is taking weekly vitamin D  supplement.  Will recheck levels today.

## 2023-10-04 ENCOUNTER — Telehealth: Payer: Self-pay | Admitting: Internal Medicine

## 2023-10-04 NOTE — Telephone Encounter (Signed)
 Fax received from [employer]. They need clarification on information and have sent a form to be filled out and returned by June 27th.

## 2023-10-05 NOTE — Telephone Encounter (Signed)
 Forms in green folder in [employer]- given to MR- Please sign ASAP since due tomorrow 10/06/23

## 2023-10-06 NOTE — Telephone Encounter (Signed)
 Rc'd signed fax. Will fax to [phone]. Will wait for confirmation and attach to claim. Will notify PT and offer a copy to her. Will put a copy in the accordion file.

## 2023-10-06 NOTE — Telephone Encounter (Signed)
 Forms completed and returned to [employer] staff

## 2023-10-11 ENCOUNTER — Telehealth: Payer: Self-pay | Admitting: *Deleted

## 2023-10-11 NOTE — Telephone Encounter (Signed)
 Patient advised we received paperwork from [employer]. Patient advised per Dr. Dolphus we are unable to complete the paperwork sent as it requires a FCE. Patient advised to reach out to PCP to see if they can complete or refer her for an FCE. Patient expressed understanding.

## 2023-10-18 ENCOUNTER — Ambulatory Visit: Admitting: Cardiology

## 2023-10-18 ENCOUNTER — Ambulatory Visit: Payer: Self-pay | Admitting: Nurse Practitioner

## 2023-10-18 NOTE — Progress Notes (Deleted)
 Clinical Summary Ms. Godar is a 53 y.o.female  seen today for follow up of the following medical problems.    1. Palpitations - ER visit 08/17/19 with tachycardia and dizziness. Reported episode of heart racing to 140s while driving. Prior feeling on uneasiness, followed by sudden palpitations. Checked HR on watch and was 144. Episodes lasted about 5 minutes - K 3.8 Cr 1.16 WBC 12 Hgb 12.8 Plt 444 hstrop 18-->17  Ddimer 0.49    - 2 total episodes of palpitations. - first episode in 07/2019 while sleeping, awoke her from sleep.  - feeling of heart racing. Walked around for a few minutes, did not resolve.  - came to ER 07/15/19, negative workup including CT PE - episode lasted about 15 minutes in total   - notes on her watch HRs up and down in general - can have minor palpitations at times.    - previously was drinking coffee x 1 day, sodas pepsi cans x 2, sweet tea glasses x 2-3, no energy drinks, no EtOH     - 08/2019 30 day monitor, no significant arrhythmias - started on dilt 30mg  bid for symptoms  rare palpitations at times, seem to be associated with her cycle. Dizziness seems to correlate with her cycle as well, dizziness not associated with palpitations.      - no recent symptoms. Has done well since starting diltiazem .    - 10/2020 14 monitor: rare PACs, rare episodes SVT up to 12 beats, rare PVCs 06/2021 ER visit with palpitations - trops neg x 2. K 3.7 EKG NSR. CXR no acute process - reports palpitations increased over the last 2 weeks. Started mild, increased in severity - Saturday more intense and longer duration.  - pneumonia early March. HR was 140 on apple watch     2. Lightheadedness - can occur with sitting, standing. At time scan be more intense when standing for long periods of time.  - - seen by neurology EEG normal, was to have CTA but troubles scheduling Carotid benign, MRI benign   Other medical issues not addressed this visit   2.  Sarcoid/Bronchiectasis - followed by pulmonary  -no significant SOB     3. CKD 2 - followed by Dr Rachele - most recent labs show renal function has been improving.    4. History of PE unprovoked - bilateral PEs in 06/2019, has been on xarelto  - repeat CT PE 07/2019 PEs had resolved.  - 06/2019 echo LVEF 60-65^, mild RV dysfunction, PASP 52 at time of PE   - no bleeding on xarelto     5. Pulmonary HTN - noted during echo during PE - repeat study shows PASP had decreased     6. OSA on cpap - mixed compliance     7. Hyperlipidmiea TC 230 HDL 58 TG 93 LDL 152 - upcomiing labs with pcp - compliant with statin  8. Atypical chest pain 03/2021 nuclear stress: no ischemia - 11/2022 coronary CTA: no CAD. Calcium  score of 0  9. Thoracic aneurysm - Recent CT imaging revealed ascending aorta measuring 3.9 cm  Past Medical History:  Diagnosis Date   Acute cholecystitis 08/24/2016   Acute kidney injury superimposed on chronic kidney disease (HCC) 06/20/2019   Acute respiratory failure with hypoxia (HCC) 06/19/2019   Annual visit for general adult medical examination with abnormal findings 04/16/2019   Arthritis    right knee   Bronchiectasis (HCC)    CKD (chronic kidney disease)  Iron deficiency    Lightheadedness 05/10/2019   PE (pulmonary thromboembolism) (HCC) 06/2019   Pre-syncope 04/16/2019   Sarcoidosis    Sarcoidosis    Sleep apnea    c pap   Smoker 02/12/2014     No Known Allergies   Current Outpatient Medications  Medication Sig Dispense Refill   acetaminophen  (TYLENOL ) 500 MG tablet Take 500 mg by mouth every 4 (four) hours as needed for mild pain, moderate pain or headache.      albuterol  (VENTOLIN  HFA) 108 (90 Base) MCG/ACT inhaler Inhale 2 puffs into the lungs every 6 (six) hours as needed for wheezing or shortness of breath. 8 g 0   azelastine  (ASTELIN ) 0.1 % nasal spray Place 1 spray into both nostrils daily at 2 am. Use in each nostril as directed (Patient  taking differently: Place 1 spray into both nostrils as needed. Use in each nostril as directed) 30 mL 12   budesonide -formoterol  (SYMBICORT ) 80-4.5 MCG/ACT inhaler Take 2 puffs first thing in am and then another 2 puffs about 12 hours later. 1 each 12   diltiazem  (CARDIZEM ) 30 MG tablet Take 1 tablet (30 mg total) by mouth daily as needed (For palpitations). May take an additional 30 mg tablet daily as needed for palpitations 135 tablet 1   LORazepam (ATIVAN) 0.5 MG tablet Take 0.5 mg by mouth as needed for anxiety.     methotrexate  (RHEUMATREX) 2.5 MG tablet Take 4 tablets (10 mg total) by mouth once a week. 16 tablet 0   rosuvastatin  (CRESTOR ) 10 MG tablet Take 1 tablet (10 mg total) by mouth at bedtime. 90 tablet 1   Vitamin D , Ergocalciferol , (DRISDOL ) 1.25 MG (50000 UNIT) CAPS capsule Take 1 capsule (50,000 Units total) by mouth every 7 (seven) days. 20 capsule 1   XARELTO  20 MG TABS tablet TAKE 1 TABLET(20 MG) BY MOUTH DAILY WITH SUPPER 90 tablet 3   No current facility-administered medications for this visit.     Past Surgical History:  Procedure Laterality Date   CHOLECYSTECTOMY N/A 08/25/2016   Procedure: LAPAROSCOPIC CHOLECYSTECTOMY;  Surgeon: Vernetta Berg, MD;  [facility];  Service: General;  Laterality: N/A;   TUBAL LIGATION  02/1993     No Known Allergies    Family History  Problem Relation Age of Onset   Hypertension Mother    Cancer Mother    Colon polyps Mother    Healthy Sister    Healthy Sister    Healthy Sister    Healthy Sister    Healthy Daughter    Healthy Son    Healthy Son    Colon cancer Neg Hx    Esophageal cancer Neg Hx    Rectal cancer Neg Hx    Stomach cancer Neg Hx      Social History Ms. Cranmore reports that she quit smoking about 8 years ago. Her smoking use included cigarettes. She started smoking about 28 years ago. She has a 20 pack-year smoking history. She has been exposed to tobacco smoke. She has never used smokeless  tobacco. Ms. Sigel reports that she does not currently use alcohol.   Review of Systems CONSTITUTIONAL: No weight loss, fever, chills, weakness or fatigue.  HEENT: Eyes: No visual loss, blurred vision, double vision or yellow sclerae.No hearing loss, sneezing, congestion, runny nose or sore throat.  SKIN: No rash or itching.  CARDIOVASCULAR:  RESPIRATORY: No shortness of breath, cough or sputum.  GASTROINTESTINAL: No anorexia, nausea, vomiting or diarrhea. No abdominal pain or blood.  GENITOURINARY: No burning on urination, no polyuria NEUROLOGICAL: No headache, dizziness, syncope, paralysis, ataxia, numbness or tingling in the extremities. No change in bowel or bladder control.  MUSCULOSKELETAL: No muscle, back pain, joint pain or stiffness.  LYMPHATICS: No enlarged nodes. No history of splenectomy.  PSYCHIATRIC: No history of depression or anxiety.  ENDOCRINOLOGIC: No reports of sweating, cold or heat intolerance. No polyuria or polydipsia.  SABRA   Physical Examination There were no vitals filed for this visit. There were no vitals filed for this visit.  Gen: resting comfortably, no acute distress HEENT: no scleral icterus, pupils equal round and reactive, no palptable cervical adenopathy,  CV Resp: Clear to auscultation bilaterally GI: abdomen is soft, non-tender, non-distended, normal bowel sounds, no hepatosplenomegaly MSK: extremities are warm, no edema.  Skin: warm, no rash Neuro:  no focal deficits Psych: appropriate affect   Diagnostic Studies  08/2019 event monitor 30 day event monitor Min HR 47, Max HR 135, Avg HR 70. Min HR occurred in early AM hours presumably while sleeping Symptoms correlate with sinus rhythm No significant arrhythmias   09/2019 echo IMPRESSIONS     1. Left ventricular ejection fraction, by estimation, is 55 to 60%. The  left ventricle has normal function. The left ventricle has no regional  wall motion abnormalities. Left ventricular  diastolic parameters were  normal.   2. Right ventricular systolic function is mildly reduced. The right  ventricular size is moderately enlarged. There is mildly elevated  pulmonary artery systolic pressure. The estimated right ventricular  systolic pressure is 35.3 mmHg.   3. Left atrial size was mildly dilated.   4. Right atrial size was mildly dilated.   5. The mitral valve is normal in structure. No evidence of mitral valve  regurgitation. No evidence of mitral stenosis.   6. Tricuspid valve regurgitation is mild to moderate.   7. The aortic valve is normal in structure. Aortic valve regurgitation is  not visualized. No aortic stenosis is present.   8. The inferior vena cava is normal in size with greater than 50%  respiratory variability, suggesting right atrial pressure of 3 mmHg.    10/2020 monitor 14 day monitor Rare supraventricular ectopy in the form of isolated PACs, coupletes, triplets. Rare episodes of SVT up to 12 beats. Rare ventricular ectopy in the form of isolated PVCs, couplets Reported symptoms correlate with sinus rhythm     03/2021 nuclear stress Lexiscan  stress is electrically negative for ischemia   Myoview  scan with probable normal perfusion and mild soft tissue attenuaton (breast)   No significant ischemia or scar   LVEF is 67% with normal wall motion.   Low risk study   Assessment and Plan   1. Palpitations -rare short runs of SVT on prior monitor - ongoing diltiazem . Soft bp's limit dosing of diltiazem , she is on low dose lisinopril  per renal for renal protection - will change dilt to 30mg  bid may take additional 30mg  prn     2.Lightheadedness - ongoing workup per neuro with f/u with Dr Skeet May 1 - was not able to get CTA he had ordered, I will message him to see if needs to be done prior to there visit - from review sarcoid can involve nervous system including autonomic nervous system. On my history symptoms with sitting or standing, can  sometimes be more intense when standing for extended period of time. Orthostatics are negative here. If no clear etiology found may consider trial of midodrine to see if helps symptoms.  Dorn PHEBE Ross, M.D., F.A.C.C.

## 2023-10-19 ENCOUNTER — Telehealth: Payer: Self-pay | Admitting: Family Medicine

## 2023-10-19 ENCOUNTER — Ambulatory Visit: Payer: Self-pay | Admitting: Nurse Practitioner

## 2023-10-19 DIAGNOSIS — G4733 Obstructive sleep apnea (adult) (pediatric): Secondary | ICD-10-CM | POA: Diagnosis not present

## 2023-10-19 NOTE — Telephone Encounter (Signed)
 Copied from CRM [id number]. Topic: Appointments - Transfer of Care >> Oct 19, 2023  3:11 PM Shereese L wrote: Pt is requesting to transfer FROM: gloria zarwolo Pt is requesting to transfer TO: Colette Torrence Hopkins Reason for requested transfer: patient rather have a MD It is the responsibility of the team the patient would like to transfer to (Dr. Colette, Torrence Hopkins) to reach out to the patient if for any reason this transfer is not acceptable.

## 2023-10-26 ENCOUNTER — Ambulatory Visit (INDEPENDENT_AMBULATORY_CARE_PROVIDER_SITE_OTHER): Admitting: Family Medicine

## 2023-10-26 ENCOUNTER — Encounter: Payer: Self-pay | Admitting: Family Medicine

## 2023-10-26 VITALS — BP 134/85 | HR 60 | Temp 97.6°F | Resp 18 | Ht 65.0 in | Wt 225.8 lb

## 2023-10-26 DIAGNOSIS — E7849 Other hyperlipidemia: Secondary | ICD-10-CM

## 2023-10-26 DIAGNOSIS — L918 Other hypertrophic disorders of the skin: Secondary | ICD-10-CM

## 2023-10-26 DIAGNOSIS — Z7689 Persons encountering health services in other specified circumstances: Secondary | ICD-10-CM

## 2023-10-26 DIAGNOSIS — Z23 Encounter for immunization: Secondary | ICD-10-CM

## 2023-10-26 NOTE — Progress Notes (Signed)
 New Patient Office Visit  Subjective    Patient ID: Hailey Ross, female    DOB: 02-02-1971  Age: 53 y.o. MRN: [id number]  CC:  Chief Complaint  Patient presents with   Establish Care    Patient is here to establish care with a new PCP, patient states that she would like to discuss a skin tag under her right arm that has been giving her problems for a while    HPI Hailey Ross presents to establish care. Pt is new to me.  She reports issues with a skin tag underneath her right arm. She says it's painful when rubbing against it. Not changed in size. She reports it's new for the last 2 years.  She has hx of HLD and use to be on Crestor . No longer takes due to running out of medicine. She is unsure if she needs this.  She had PE and hx of sarcoidosis. She is on Xarelto  since 2019-11-17. She is on Methotrexate  10 mg weekly. She would like PCV-20. She was having palpitations and is using Diltiazem  30 mg daily prn . She has hx of anxiety and using Ativan 0.5 mg daily prn. She sees psychiatrist and started with the death of her daughter in 11-16-2020 at the age of 78.   Outpatient Encounter Medications as of 10/26/2023  Medication Sig   acetaminophen  (TYLENOL ) 500 MG tablet Take 500 mg by mouth every 4 (four) hours as needed for mild pain, moderate pain or headache.    albuterol  (VENTOLIN  HFA) 108 (90 Base) MCG/ACT inhaler Inhale 2 puffs into the lungs every 6 (six) hours as needed for wheezing or shortness of breath.   budesonide -formoterol  (SYMBICORT ) 80-4.5 MCG/ACT inhaler Take 2 puffs first thing in am and then another 2 puffs about 12 hours later.   diltiazem  (CARDIZEM ) 30 MG tablet Take 1 tablet (30 mg total) by mouth daily as needed (For palpitations). May take an additional 30 mg tablet daily as needed for palpitations   LORazepam (ATIVAN) 0.5 MG tablet Take 0.5 mg by mouth as needed for anxiety.   Vitamin D , Ergocalciferol , (DRISDOL ) 1.25 MG (50000 UNIT) CAPS capsule Take 1 capsule  (50,000 Units total) by mouth every 7 (seven) days.   XARELTO  20 MG TABS tablet TAKE 1 TABLET(20 MG) BY MOUTH DAILY WITH SUPPER   rosuvastatin  (CRESTOR ) 10 MG tablet Take 1 tablet (10 mg total) by mouth at bedtime. (Patient not taking: Reported on 10/26/2023)   [DISCONTINUED] azelastine  (ASTELIN ) 0.1 % nasal spray Place 1 spray into both nostrils daily at 2 am. Use in each nostril as directed (Patient taking differently: Place 1 spray into both nostrils as needed. Use in each nostril as directed)   [DISCONTINUED] methotrexate  (RHEUMATREX) 2.5 MG tablet Take 4 tablets (10 mg total) by mouth once a week.   No facility-administered encounter medications on file as of 10/26/2023.    Past Medical History:  Diagnosis Date   Acute cholecystitis 08/24/2016   Acute kidney injury superimposed on chronic kidney disease (HCC) 06/20/2019   Acute respiratory failure with hypoxia (HCC) 06/19/2019   Arthritis    right knee   Bronchiectasis (HCC)    CKD (chronic kidney disease)    Iron deficiency    PE (pulmonary thromboembolism) (HCC) 06/2019   Pre-syncope 04/16/2019   Sarcoidosis    Sleep apnea    c pap   Smoker 02/12/2014    Past Surgical History:  Procedure Laterality Date   CHOLECYSTECTOMY N/A 08/25/2016   Procedure:  LAPAROSCOPIC CHOLECYSTECTOMY;  Surgeon: Vernetta Berg, MD;  Location: [facility] OR;  Service: General;  Laterality: N/A;   TUBAL LIGATION  02/1993    Family History  Problem Relation Age of Onset   Hypertension Mother    Cancer Mother    Colon polyps Mother    Healthy Sister    Healthy Sister    Healthy Sister    Healthy Sister    Healthy Daughter    Healthy Son    Healthy Son    Colon cancer Neg Hx    Esophageal cancer Neg Hx    Rectal cancer Neg Hx    Stomach cancer Neg Hx     Social History   Socioeconomic History   Marital status: Married    Spouse name: Not on file   Number of children: 3   Years of education: Not on file   Highest education level: High  school graduate  Occupational History   Occupation: Administrator, [employer] ([employer])  Tobacco Use   Smoking status: Former    Current packs/day: 0.00    Average packs/day: 1 pack/day for 20.0 years (20.0 ttl pk-yrs)    Types: Cigarettes    Start date: 03/21/1995    Quit date: 03/21/2015    Years since quitting: 8.6    Passive exposure: Past   Smokeless tobacco: Never  Vaping Use   Vaping status: Never Used  Substance and Sexual Activity   Alcohol use: Not Currently    Comment: Rare   Drug use: Never   Sexual activity: Yes    Birth control/protection: Surgical  Other Topics Concern   Not on file  Social History Narrative   Live with partners [name]-19 years   3 children.    1-[name] (girl) 27 at home with her: 2 grandchildren in her home as well    [name]- 26 expecting   [name]-25      Enjoy: read, play games on tablet, grandkids      Diet: Veggies, does not eat a lot of fried foods, enjoys chicken and fruit   Caffeine: sodas and coffee (with sugar)   Water: Does not drink a lot at all      Wears seat beat   Does not wear sunscreen   Smoke and carbon monoxide detectors   Does not use phone while driving    Social Drivers of Corporate investment banker Strain: Low Risk  (10/10/2018)   Overall Financial Resource Strain (CARDIA)    Difficulty of Paying Living Expenses: Not hard at all  Food Insecurity: No Food Insecurity (12/06/2022)   Hunger Vital Sign    Worried About Running Out of Food in the Last Year: Never true    Ran Out of Food in the Last Year: Never true  Transportation Needs: No Transportation Needs (12/06/2022)   PRAPARE - Administrator, Civil Service (Medical): No    Lack of Transportation (Non-Medical): No  Physical Activity: Sufficiently Active (10/10/2018)   Exercise Vital Sign    Days of Exercise per Week: 7 days    Minutes of Exercise per Session: 60 min  Stress: No Stress Concern Present (10/10/2018)   [employer] of Occupational Health -  Occupational Stress Questionnaire    Feeling of Stress : Not at all  Social Connections: Unknown (08/24/2021)   Received from [facility]   Social Network    Social Network: Not on file  Intimate Partner Violence: Not At Risk (12/06/2022)   Humiliation, Afraid, Rape, and Kick questionnaire  Fear of Current or Ex-Partner: No    Emotionally Abused: No    Physically Abused: No    Sexually Abused: No    Review of Systems  Skin:        Skin tag underneath right arm  All other systems reviewed and are negative.       Objective    LMP 04/01/2021 (Exact Date)   Physical Exam Vitals and nursing note reviewed.  Constitutional:      Appearance: Normal appearance. She is obese.  HENT:     Head: Normocephalic and atraumatic.     Right Ear: External ear normal.     Left Ear: External ear normal.     Nose: Nose normal.     Mouth/Throat:     Mouth: Mucous membranes are moist.     Pharynx: Oropharynx is clear.  Eyes:     Conjunctiva/sclera: Conjunctivae normal.     Pupils: Pupils are equal, round, and reactive to light.  Cardiovascular:     Rate and Rhythm: Normal rate and regular rhythm.     Pulses: Normal pulses.     Heart sounds: Normal heart sounds.  Pulmonary:     Effort: Pulmonary effort is normal.     Breath sounds: Normal breath sounds.  Skin:    General: Skin is warm.     Capillary Refill: Capillary refill takes less than 2 seconds.     Comments: Skin tag underneath right axilla  Neurological:     General: No focal deficit present.     Mental Status: She is alert and oriented to person, place, and time. Mental status is at baseline.  Psychiatric:        Mood and Affect: Mood normal.        Behavior: Behavior normal.        Thought Content: Thought content normal.        Judgment: Judgment normal.    Last lipids Lab Results  Component Value Date   CHOL 199 01/17/2023   HDL 93 01/17/2023   LDLCALC 96 01/17/2023   TRIG 56 01/17/2023   CHOLHDL 2.1  01/17/2023        Assessment & Plan:   Problem List Items Addressed This Visit   None  Encounter to establish care with new doctor  Skin tag -     Ambulatory referral to Dermatology  Other hyperlipidemia -     Lipid panel  Need for vaccination against Streptococcus pneumoniae -     Pneumococcal conjugate vaccine 20-valent  Pt with skin tag, wanting removal. Refer to dermatology. Pt with hx of HLD. Not taking statin. To repeat today to see if still indicated. PCV-20  No follow-ups on file.   Torrence CINDERELLA Barrier, MD

## 2023-10-27 ENCOUNTER — Other Ambulatory Visit: Payer: Self-pay | Admitting: Family Medicine

## 2023-10-27 ENCOUNTER — Ambulatory Visit: Payer: Self-pay | Admitting: Family Medicine

## 2023-10-27 DIAGNOSIS — E785 Hyperlipidemia, unspecified: Secondary | ICD-10-CM

## 2023-10-27 LAB — LIPID PANEL
Chol/HDL Ratio: 3.4 ratio (ref 0.0–4.4)
Cholesterol, Total: 240 mg/dL — ABNORMAL HIGH (ref 100–199)
HDL: 71 mg/dL (ref >39)
LDL Chol Calc (NIH): 159 mg/dL — ABNORMAL HIGH (ref 0–99)
Triglycerides: 63 mg/dL (ref 0–149)
VLDL Cholesterol Cal: 10 mg/dL (ref 5–40)

## 2023-10-27 MED ORDER — ROSUVASTATIN CALCIUM 5 MG PO TABS: 5.0000 mg | ORAL_TABLET | Freq: Every evening | ORAL | 1 refills | Status: DC

## 2023-11-01 ENCOUNTER — Encounter: Payer: Self-pay | Admitting: Internal Medicine

## 2023-11-01 ENCOUNTER — Ambulatory Visit: Admitting: Internal Medicine

## 2023-11-01 VITALS — BP 126/80 | HR 70 | Ht 65.0 in | Wt 229.0 lb

## 2023-11-01 DIAGNOSIS — R194 Change in bowel habit: Secondary | ICD-10-CM

## 2023-11-01 NOTE — Patient Instructions (Signed)
 Take 2 tablespoons of Citrucel in water or juice daily.  Please follow up as needed.  _______________________________________________________  If your blood pressure at your visit was 140/90 or greater, please contact your primary care physician to follow up on this.  _______________________________________________________  If you are age 53 or older, your body mass index should be between 23-30. Your Body mass index is 38.11 kg/m. If this is out of the aforementioned range listed, please consider follow up with your Primary Care Provider.  If you are age 46 or younger, your body mass index should be between 19-25. Your Body mass index is 38.11 kg/m. If this is out of the aformentioned range listed, please consider follow up with your Primary Care Provider.   ________________________________________________________  The [facility] providers would like to encourage you to use MYCHART to communicate with providers for non-urgent requests or questions.  Due to long hold times on the telephone, sending your provider a message by [facility] may be a faster and more efficient way to get a response.  Please allow 48 business hours for a response.  Please remember that this is for non-urgent requests.  _______________________________________________________  [facility] is using a team-based approach to care.  Your team is made up of your doctor and two to three APPS. Our APPS (Nurse Practitioners and Physician Assistants) work with your physician to ensure care continuity for you. They are fully qualified to address your health concerns and develop a treatment plan. They communicate directly with your gastroenterologist to care for you. Seeing the Advanced Practice Practitioners on your physician's team can help you by facilitating care more promptly, often allowing for earlier appointments, access to diagnostic testing, procedures, and other specialty referrals.

## 2023-11-01 NOTE — Progress Notes (Signed)
 HISTORY OF PRESENT ILLNESS:  Hailey Ross is a 53 y.o. female with past medical history as listed below who presents today regarding change in bowel habits and abdominal discomfort.  Patient has been seen in the past regarding various chronic abdominal complaints.  Extensive workup including colonoscopy, upper endoscopy, and imaging have been unremarkable.  Blood work the same.  Last seen in this office April 25, 2023.  Complaining of alternating bowel habits at that time.  Tells me that since that visit she did well until late June when she had 1 day of continual diarrhea.  No bowel movements the next day.  She describes constipation for a few days for which she took laxatives.  Overall she states that her bowels have been regular since January without taking any agents.  Currently states that her bowel habits have not returned to normal.  She wants to know why this happens.  She also tells me that she gets cramping under the right rib cage when she sneezes.  She wants to know why this happens.  No alarm features such as bleeding, fevers, or weight loss.  REVIEW OF SYSTEMS:  All non-GI ROS negative except for back pain, fatigue, muscle cramps, urinary leakage, shortness of breath  Past Medical History:  Diagnosis Date   Acute cholecystitis 08/24/2016   Acute kidney injury superimposed on chronic kidney disease (HCC) 06/20/2019   Acute respiratory failure with hypoxia (HCC) 06/19/2019   Arthritis    right knee   Bronchiectasis (HCC)    CKD (chronic kidney disease)    Iron deficiency    PE (pulmonary thromboembolism) (HCC) 06/2019   Pre-syncope 04/16/2019   Sarcoidosis    Sleep apnea    c pap   Smoker 02/12/2014    Past Surgical History:  Procedure Laterality Date   CHOLECYSTECTOMY N/A 08/25/2016   Procedure: LAPAROSCOPIC CHOLECYSTECTOMY;  Surgeon: Vernetta Berg, MD;  Location: [facility] OR;  Service: General;  Laterality: N/A;   TUBAL LIGATION  02/1993    Social History HANAN MCWILLIAMS  reports that she quit smoking about 8 years ago. Her smoking use included cigarettes. She started smoking about 28 years ago. She has a 20 pack-year smoking history. She has been exposed to tobacco smoke. She has never used smokeless tobacco. She reports that she does not currently use alcohol. She reports that she does not use drugs.  family history includes Cancer in her mother; Colon polyps in her mother; Healthy in her daughter, sister, sister, sister, sister, son, and son; Hypertension in her mother.  No Known Allergies     PHYSICAL EXAMINATION: Vital signs: BP 126/80   Pulse 70   Ht 5' 5 (1.651 m)   Wt 229 lb (103.9 kg)   LMP 04/01/2021 (Exact Date)   BMI 38.11 kg/m   Constitutional: generally well-appearing, no acute distress Psychiatric: alert and oriented x3, cooperative Eyes: extraocular movements intact, anicteric, conjunctiva pink Mouth: oral pharynx moist, no lesions Neck: supple no lymphadenopathy Cardiovascular: heart regular rate and rhythm, no murmur Lungs: clear to auscultation bilaterally Abdomen: soft, obese, nontender, nondistended, no obvious ascites, no peritoneal signs, normal bowel sounds, no organomegaly Rectal: Omitted Extremities: no clubbing, cyanosis, or lower extremity edema bilaterally Skin: no lesions on visible extremities Neuro: No focal deficits.  Cranial nerves intact  ASSESSMENT:  1.  Transient loose stools followed by constipation. 2.  Normal colonoscopy 2021 3.  Vague abdominal complaints.  Ongoing 4.  Normal EGD 2021 5.  Morbid obesity 6.  General Medical  problems including history of pulmonary sarcoidosis   PLAN:  1.  Recommend Citrucel 2 tablespoons daily to help regulate bowels 2.  Reassurance 3.  Repeat screening colonoscopy 2031 4.  Resume general medical care with PCP and other specialists A total time of 30 minutes was spent preparing to see the patient, obtaining comprehensive history, performing medically  appropriate physical examination, counseling and educating the patient regarding the above listed issues, answering multiple questions, directing symptomatic therapies, and documenting clinical information in the health record

## 2023-11-14 ENCOUNTER — Ambulatory Visit: Admitting: Orthopaedic Surgery

## 2023-11-17 ENCOUNTER — Other Ambulatory Visit (INDEPENDENT_AMBULATORY_CARE_PROVIDER_SITE_OTHER): Payer: Self-pay

## 2023-11-17 ENCOUNTER — Ambulatory Visit (INDEPENDENT_AMBULATORY_CARE_PROVIDER_SITE_OTHER): Admitting: Orthopaedic Surgery

## 2023-11-17 DIAGNOSIS — M1712 Unilateral primary osteoarthritis, left knee: Secondary | ICD-10-CM | POA: Diagnosis not present

## 2023-11-17 DIAGNOSIS — M25562 Pain in left knee: Secondary | ICD-10-CM

## 2023-11-17 DIAGNOSIS — G8929 Other chronic pain: Secondary | ICD-10-CM | POA: Diagnosis not present

## 2023-11-17 MED ORDER — BUPIVACAINE HCL 0.5 % IJ SOLN
2.0000 mL | INTRAMUSCULAR | Status: AC | PRN
Start: 2023-11-17 — End: 2023-11-17
  Administered 2023-11-17: 2 mL via INTRA_ARTICULAR

## 2023-11-17 MED ORDER — LIDOCAINE HCL 1 % IJ SOLN
2.0000 mL | INTRAMUSCULAR | Status: AC | PRN
  Administered 2023-11-17: 2 mL

## 2023-11-17 MED ORDER — METHYLPREDNISOLONE ACETATE 40 MG/ML IJ SUSP
40.0000 mg | INTRAMUSCULAR | Status: AC | PRN
  Administered 2023-11-17: 40 mg via INTRA_ARTICULAR

## 2023-11-17 NOTE — Progress Notes (Signed)
 Office Visit Note   Patient: Hailey Ross           Date of Birth: 05-31-1970           MRN: [id number] Visit Date: 11/17/2023              Requested by: Colette Torrence GRADE, MD [address] [place],  [place] [address] PCP: Colette Torrence GRADE, MD   Assessment & Plan: Visit Diagnoses:  1. Primary osteoarthritis of left knee     Plan: History of Present Illness Hailey Ross is a 53 year old female who presents with knee pain and stiffness.  Last week, she experienced significant pain and stiffness in the left knee, causing difficulty in maneuvering and increased limping. The knee was notably swollen.  She has received cortisone injections from her rheumatologist for swelling, with the last injection in May 2023 providing relief. Fluid has never been aspirated from the knee.  Currently, the knee remains stiff but is less painful than the previous week.  Physical Exam MUSCULOSKELETAL: Left knee with large joint effusion.  Pain with ROM.  No signs of infection.  Assessment and Plan Left knee joint effusion and rheumatoid arthritis Recurrent effusion with significant fluid accumulation and associated pain and stiffness. Previous cortisone injections effective. - Drain left knee joint effusion.  20 cc aspirated - Administer cortisone injection into left knee.  Follow-Up Instructions: No follow-ups on file.   Orders:  Orders Placed This Encounter  Procedures   XR KNEE 3 VIEW LEFT   Synovial Fluid Analysis, Complete   No orders of the defined types were placed in this encounter.     Procedures: Large Joint Inj: L knee on 11/17/2023 8:41 PM Details: 22 G needle Medications: 2 mL bupivacaine  0.5 %; 2 mL lidocaine  1 %; 40 mg methylPREDNISolone  acetate 40 MG/ML Outcome: tolerated well, no immediate complications Patient was prepped and draped in the usual sterile fashion.       Clinical Data: No additional findings.   Subjective: Chief Complaint  Patient  presents with   Left Knee - Pain    HPI  Review of Systems  Constitutional: Negative.   HENT: Negative.    Eyes: Negative.   Respiratory: Negative.    Cardiovascular: Negative.   Endocrine: Negative.   Musculoskeletal: Negative.   Neurological: Negative.   Hematological: Negative.   Psychiatric/Behavioral: Negative.    All other systems reviewed and are negative.    Objective: Vital Signs: LMP 04/01/2021 (Exact Date)   Physical Exam Vitals and nursing note reviewed.  Constitutional:      Appearance: She is well-developed.  HENT:     Head: Atraumatic.     Nose: Nose normal.  Eyes:     Extraocular Movements: Extraocular movements intact.  Cardiovascular:     Pulses: Normal pulses.  Pulmonary:     Effort: Pulmonary effort is normal.  Abdominal:     Palpations: Abdomen is soft.  Musculoskeletal:     Cervical back: Neck supple.  Skin:    General: Skin is warm.     Capillary Refill: Capillary refill takes less than 2 seconds.  Neurological:     Mental Status: She is alert. Mental status is at baseline.  Psychiatric:        Behavior: Behavior normal.        Thought Content: Thought content normal.        Judgment: Judgment normal.     Ortho Exam  Specialty Comments:  No specialty comments  available.  Imaging: XR KNEE 3 VIEW LEFT Result Date: 11/17/2023 X-rays of the left knee show no acute or structural abnormalities.  Mild arthritic changes.    PMFS History: Patient Active Problem List   Diagnosis Date Noted   Asthmatic bronchitis , chronic (HCC) 06/12/2023   Abscess 05/26/2023   Prediabetes 02/20/2023   Multiple cysts of breast 02/17/2023   Overactive bladder 01/18/2023   Chronic anticoagulation 12/06/2022   Epistaxis 12/06/2022   Acute frontal sinusitis 12/06/2022   Acute respiratory failure with hypoxia (HCC) 12/05/2022   Gastroparesis 11/29/2022   Colitis 10/18/2022   Other constipation 10/04/2022   Upper abdominal pain 04/29/2022   Early  satiety 04/29/2022   Acute bronchitis 03/21/2022   Immunization due 12/17/2021   Need for immunization against influenza 12/17/2021   Fatigue 12/17/2021   Encounter for annual general medical examination with abnormal findings in adult 08/30/2021   CAP (community acquired pneumonia) 06/16/2021   Acute midline low back pain without sciatica 04/22/2021   Left lower quadrant abdominal pain 04/20/2021   Atypical chest pain 01/26/2021   Synovitis of left knee 01/19/2021   GERD (gastroesophageal reflux disease) 12/29/2020   Abdominal pain, epigastric 12/28/2020   Primary osteoarthritis of left knee 10/27/2020   Grief at loss of child 06/09/2020   Insomnia 06/09/2020   Right ankle swelling 02/05/2020   Encounter for examination following treatment at hospital 01/28/2020   Palpitations 01/28/2020   Menorrhagia with regular cycle 10/15/2019   OSA (obstructive sleep apnea) 10/08/2019   Right knee pain 08/29/2019   Positive ANA (antinuclear antibody) 08/29/2019   Essential hypertension 07/04/2019   Pain and swelling of right knee 07/04/2019   Hyperlipidemia 07/04/2019   Stage 2 chronic kidney disease 07/04/2019   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   Encounter for support and coordination of transition of care 07/04/2019   Daytime hypersomnolence 07/02/2019   Pulmonary hypertension (HCC) 07/02/2019   CKD (chronic kidney disease)    Bilateral pulmonary embolism (HCC) 06/18/2019   Lightheadedness 05/10/2019   Vitamin D  deficiency 05/10/2019   Obesity (BMI 30-39.9) 04/16/2019   Sarcoidosis 05/04/2015   Bronchiectasis (HCC) 05/04/2015   Past Medical History:  Diagnosis Date   Acute cholecystitis 08/24/2016   Acute kidney injury superimposed on chronic kidney disease (HCC) 06/20/2019   Acute respiratory failure with hypoxia (HCC) 06/19/2019   Arthritis    right knee   Bronchiectasis (HCC)    CKD (chronic kidney disease)    Iron deficiency    PE (pulmonary thromboembolism) (HCC)  06/2019   Pre-syncope 04/16/2019   Sarcoidosis    Sleep apnea    c pap   Smoker 02/12/2014    Family History  Problem Relation Age of Onset   Hypertension Mother    Cancer Mother    Colon polyps Mother    Healthy Sister    Healthy Sister    Healthy Sister    Healthy Sister    Healthy Daughter    Healthy Son    Healthy Son    Colon cancer Neg Hx    Esophageal cancer Neg Hx    Rectal cancer Neg Hx    Stomach cancer Neg Hx     Past Surgical History:  Procedure Laterality Date   CHOLECYSTECTOMY N/A 08/25/2016   Procedure: LAPAROSCOPIC CHOLECYSTECTOMY;  Surgeon: Vernetta Berg, MD;  Location: [facility] OR;  Service: General;  Laterality: N/A;   TUBAL LIGATION  02/1993   Social History   Occupational History   Occupation: Administrator, [employer] ([employer])  Tobacco Use   Smoking status: Former    Current packs/day: 0.00    Average packs/day: 1 pack/day for 20.0 years (20.0 ttl pk-yrs)    Types: Cigarettes    Start date: 03/21/1995    Quit date: 03/21/2015    Years since quitting: 8.6    Passive exposure: Past   Smokeless tobacco: Never  Vaping Use   Vaping status: Never Used  Substance and Sexual Activity   Alcohol use: Not Currently    Comment: Rare   Drug use: Never   Sexual activity: Yes    Birth control/protection: Surgical

## 2023-11-18 LAB — SYNOVIAL FLUID ANALYSIS, COMPLETE
Basophils, %: 0 %
Eosinophils-Synovial: 2 % (ref 0–2)
Lymphocytes-Synovial Fld: 54 % (ref 0–74)
Monocyte/Macrophage: 4 % (ref 0–69)
Neutrophil, Synovial: 40 % — ABNORMAL HIGH (ref 0–24)
Synoviocytes, %: 0 % (ref 0–15)
WBC, Synovial: 174 {cells}/uL — ABNORMAL HIGH (ref <150)

## 2023-11-18 LAB — TIQ-NTM

## 2023-11-19 DIAGNOSIS — G4733 Obstructive sleep apnea (adult) (pediatric): Secondary | ICD-10-CM | POA: Diagnosis not present

## 2023-11-21 ENCOUNTER — Encounter: Payer: Self-pay | Admitting: Family Medicine

## 2023-11-30 NOTE — Progress Notes (Unsigned)
 OV 04/24/2023 -transfer of care from Dr. Shellia who has left the practice to Dr. Geronimo in the ILD center.  She has sarcoidosis with fibrotic changes.  Subjective:  Patient ID: Hailey Ross, female , DOB: 15-Mar-1971 , age 53 y.o. , MRN: [id number] , ADDRESS: [address] [place] [address] PCP Zarwolo, Gloria, FNP Patient Care Team: Zarwolo, Gloria, FNP as PCP - General (Family Medicine) Alvan, Dorn FALCON, MD as PCP - Cardiology (Cardiology) Rogers Hai, MD as Consulting Physician (Hematology) Shaaron Lamar HERO, MD as Consulting Physician (Gastroenterology)  This Provider for this visit: Treatment Team:  Attending Provider: Geronimo Amel, MD    04/24/2023 -   Chief Complaint  Patient presents with   Consult    Denies any concerns. Use to see Dr.sood, states breathing has been fine. Using breztri  inhaler      HPI Hailey Ross 53 y.o. 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  Sleep apnea on CPAP.  She has a history of sarcoidosis.  Along with fibrotic changes she says she has been following with Dr. Ingrid in 2017.  In 2024 she said her PFTs were declining.  She does have shortness of breath on exertion relieved by rest particularly for cleaning the house or doing any vacuuming.  While climbing up a hill.  This been going on for a long time and is chronic.  She came in for this chronic visit and ILD evaluation but she reported acute problems of having worsening cough for the last [redacted] weeks along with chest tightness along with sputum that is yellow-green and is also increased shortness of breath.  Her husband and her grandkids have been sick with respiratory infection in this winter season as recently as last week and she has been exposed to them.  She is on methotrexate  for her sarcoid.  Her last CT scan of the chest was in August 2024.    OV 07/31/2023  Subjective:  Patient ID: Hailey Ross, female , DOB: [birth year] , age 72 y.o. , MRN: [id number] , ADDRESS: [address] [place] [address] PCP Zarwolo, Gloria, FNP Patient Care Team: Zarwolo, Gloria, FNP as PCP - General (Family Medicine) Alvan, Dorn FALCON, MD as PCP - Cardiology (Cardiology) Rogers Hai, MD as Consulting Physician (Hematology) Shaaron Lamar HERO, MD as Consulting Physician (Gastroenterology)  This Provider for this visit: Treatment Team:  Attending Provider: Geronimo Amel, MD    07/31/2023 -   Chief Complaint  Patient presents with   Follow-up    PFT done today. Breathing is overall doing well.      HPI Hailey Ross 53 y.o. -Presents for followup of sarcoid.  On 06/12/23 saw Dr Kathrine acutely and given doxy/prednisone  but now well. Othwerise, Interim Health status: No new complaints No new medical problems. No new surgeries. No ER visits. No Urgent care visits. No changes to medications. Dyspnea stable. Presents with exerton . There is also variability on some days There is some wheezing at night as well occassinal But overall stable.. Dr Darlean changed trelegy to symbicort   Lung function stable 2021 -> 2025 Eercise hypoxemia test -> stabl      OV 12/01/2023  Subjective:  Patient ID: Hailey Ross, female , DOB: January 18, 1971 , age 66 y.o. , MRN: [id number] , ADDRESS: [address] [place] [address] PCP Colette Torrence GRADE, MD Patient Care Team: Colette Torrence GRADE, MD as PCP - General (Family Medicine) Alvan,  Dorn FALCON, MD as PCP - Cardiology (Cardiology) Shaaron Lamar HERO, MD as Consulting Physician (Gastroenterology)  This Provider for this visit: Treatment Team:  Attending Provider: Geronimo Amel, MD    12/01/2023 -   Chief Complaint  Patient presents with   Medical Management of Chronic Issues   Sarcoidosis    PFT done today. Breathing is overall doing well and no new co's.     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  She is also Breztri  from before. #History of pulmonary embolism on lifelong Xarelto  #History of OSA on CPAP #Prior greater than 20 pack smoker   HPI Hailey Ross 53 y.o. -last seen in April 2025.  This visit she was supposed to have CT scan of the chest but for some reason it was not scheduled.  She did have pulmonary function test but it is stable since 2021.  Is only progressive since 2017.  Since her last visit Interim Health status: No new complaints No new medical problems. No new surgeries. No ER visits. No Urgent care visits. No changes to medications.  She does have some mild dyspnea on exertion.  She says when she has the urge to go to the bathroom she will get short of breath with exertion.  She says this does not happen when she walks normally in the same distance.  She wanted to know why.  She has a gastroenterologist and did not get a clear-cut answer.  I speculated this could be because she is trying to rush to the bathroom.  She believes she is not rushing but she does use the word urge when trying to reach to the restroom.        SIT STAND TEST - goal 15 times   07/31/2023    O2 used ra   PRobe - finter or forehead forehead   Number sit and stand completed - goal 15 Yes 15   Time taken to complete 58 sec   Resting Pulse Ox/HR/Dyspnea  96% and 61/min and dyspnea of 1/10    Peak measures 90 % and 89/min and dyspnea of 5/10   Final Pulse Ox/HR 90% and 77/min and dyspnea of 2/10   Desaturated </= 88% NO   Desaturated <= 3% points YES   Got Tachycardic >/= 90/min no   Miscellaneous comments no       PFT     Latest Ref Rng & Units 12/01/2023    9:14 AM 07/31/2023   10:02 AM 06/20/2022   11:05 AM 09/26/2019    2:56 PM 05/26/2015    2:59 PM  PFT Results  FVC-Pre L 1.24  P 1.27  1.25  P 1.25  1.78   FVC-Predicted Pre % 34  P 35  34  P 34  57   FVC-Post L   1.16  P 1.16  1.88   FVC-Predicted Post %   32  P 32  60   Pre FEV1/FVC % % 82  P 84  83  P 83   85   Post FEV1/FCV % %   87  P 87  88   FEV1-Pre L 1.02  P 1.07  1.04  P 1.04  1.51   FEV1-Predicted Pre % 35  P 37  36  P 36  59   FEV1-Post L   1.01  P 1.01  1.67   DLCO uncorrected ml/min/mmHg 7.21  P 7.69  6.93  P 6.93  9.41   DLCO UNC% %  33  P 35  32  P 32  36   DLCO corrected ml/min/mmHg   6.93  P 6.93  9.38   DLCO COR %Predicted %   32  P 32  36   DLVA Predicted % 69  P 72  67  P 67  73   TLC L  2.74  3.23  P 3.23  2.78   TLC % Predicted %  52  62  P 62  53   RV % Predicted %  67  77  P 77  44     P Preliminary result       LAB RESULTS last 96 hours No results found.       has a past medical history of Acute cholecystitis (08/24/2016), Acute kidney injury superimposed on chronic kidney disease (HCC) (06/20/2019), Acute respiratory failure with hypoxia (HCC) (06/19/2019), Arthritis, Bronchiectasis (HCC), CKD (chronic kidney disease), Iron deficiency, PE (pulmonary thromboembolism) (HCC) (06/2019), Pre-syncope (04/16/2019), Sarcoidosis, Sleep apnea, and Smoker (02/12/2014).   reports that she quit smoking about 8 years ago. Her smoking use included cigarettes. She started smoking about 28 years ago. She has a 20 pack-year smoking history. She has been exposed to tobacco smoke. She has never used smokeless tobacco.  Past Surgical History:  Procedure Laterality Date   CHOLECYSTECTOMY N/A 08/25/2016   Procedure: LAPAROSCOPIC CHOLECYSTECTOMY;  Surgeon: Vernetta Berg, MD;  Location: [facility] OR;  Service: General;  Laterality: N/A;   TUBAL LIGATION  02/1993    No Known Allergies  Immunization History  Administered Date(s) Administered   Influenza Inj Mdck Quad Pf 12/25/2019   Influenza,inj,Quad PF,6+ Mos 12/21/2018, 02/22/2021, 12/17/2021   Influenza,inj,Quad PF,6-35 Mos 01/10/2019   Influenza-Unspecified 12/20/2022   PFIZER(Purple Top)SARS-COV-2 Vaccination 08/14/2019, 09/10/2019, 02/07/2020   PNEUMOCOCCAL CONJUGATE-20 10/26/2023   Pneumococcal Polysaccharide-23  02/12/2014   Tdap 02/12/2014   Zoster Recombinant(Shingrix ) 12/17/2021, 10/18/2022    Family History  Problem Relation Age of Onset   Hypertension Mother    Cancer Mother    Colon polyps Mother    Healthy Sister    Healthy Sister    Healthy Sister    Healthy Sister    Healthy Daughter    Healthy Son    Healthy Son    Colon cancer Neg Hx    Esophageal cancer Neg Hx    Rectal cancer Neg Hx    Stomach cancer Neg Hx      Current Outpatient Medications:    acetaminophen  (TYLENOL ) 500 MG tablet, Take 500 mg by mouth every 4 (four) hours as needed for mild pain, moderate pain or headache. , Disp: , Rfl:    albuterol  (VENTOLIN  HFA) 108 (90 Base) MCG/ACT inhaler, Inhale 2 puffs into the lungs every 6 (six) hours as needed for wheezing or shortness of breath., Disp: 8 g, Rfl: 0   budesonide -glycopyrrolate-formoterol  (BREZTRI  AEROSPHERE) 160-9-4.8 MCG/ACT AERO inhaler, Inhale 2 puffs into the lungs 2 (two) times daily., Disp: , Rfl:    diltiazem  (CARDIZEM ) 30 MG tablet, Take 1 tablet (30 mg total) by mouth daily as needed (For palpitations). May take an additional 30 mg tablet daily as needed for palpitations, Disp: 135 tablet, Rfl: 1   LORazepam (ATIVAN) 0.5 MG tablet, Take 0.5 mg by mouth as needed for anxiety., Disp: , Rfl:    rosuvastatin  (CRESTOR ) 5 MG tablet, Take 1 tablet (5 mg total) by mouth at bedtime., Disp: 90 tablet, Rfl: 1   Vitamin D , Ergocalciferol , (DRISDOL ) 1.25 MG (50000 UNIT) CAPS capsule, Take 1 capsule (  50,000 Units total) by mouth every 7 (seven) days., Disp: 20 capsule, Rfl: 1   XARELTO  20 MG TABS tablet, TAKE 1 TABLET(20 MG) BY MOUTH DAILY WITH SUPPER, Disp: 90 tablet, Rfl: 3   budesonide -formoterol  (SYMBICORT ) 80-4.5 MCG/ACT inhaler, Take 2 puffs first thing in am and then another 2 puffs about 12 hours later., Disp: 1 each, Rfl: 12      Objective:   Vitals:   12/01/23 1051  BP: 124/82  Pulse: 60  SpO2: 100%  Weight: 225 lb (102.1 kg)  Height: 5' 5 (1.651  m)    Estimated body mass index is 37.44 kg/m as calculated from the following:   Height as of this encounter: 5' 5 (1.651 m).   Weight as of this encounter: 225 lb (102.1 kg).  @WEIGHTCHANGE @  [employer]   12/01/23 1051  Weight: 225 lb (102.1 kg)     Physical Exam   General: No distress. Looks well O2 at rest: no Cane present: no Sitting in wheel chair: no Frail: no Obese: no Neuro: Alert and Oriented x 3. GCS 15. Speech normal Psych: Pleasant Resp:  Barrel Chest - no.  Wheeze - no, Crackles - no, No overt respiratory distress CVS: Normal heart sounds. Murmurs - no Ext: Stigmata of Connective Tissue Disease - no HEENT: Normal upper airway. PEERL +. No post nasal drip        Assessment/     Assessment & Plan Sarcoidosis of lung (HCC)  History of pulmonary embolus (PE)  Stopped smoking with greater than 20 pack year history    PLAN Patient Instructions  Sarcoidosis of lung (HCC) High risk medication use Encounter for therapeutic drug monitoring Pulmonary fibrosis (HCC) Quit smoking > 20 pack  - progressive since 2017 but stable 2021 -> 2025 August on breathing test -  continues on methotrexate /folic acid  via rheumatology - on breztri  - stable dyspnea on exertiona  Plan'  - continue brezri, methotrexate  , folic acid  as before - HRCT Chest in 6 months  History of pulmonary embolus (PE)  Plan  - continue life long xarelto    Hx  of OSA  Plan  - Wear CPAP At bedtime    Follwup  6 months; 15 min visit in 6 months but after  CT chest  - symptom score and exercise hypoxemia test at followup    FOLLOWUP    Return in about 6 months (around 06/02/2024) for 15 min visit, after HRCT chest, with Dr Geronimo, Face to Face Visit.    SIGNATURE    Dr. Dorethia Geronimo, M.D., F.C.C.P,  Pulmonary and Critical Care Medicine Staff Physician, [facility] Director - Interstitial Lung Disease  Program  Pulmonary Fibrosis [facility] [place], [place], [address]  Pager: [phone], If no answer or between  15:00h - 7:00h: call [phone] Telephone: [phone]  2:01 PM 12/01/2023

## 2023-12-01 ENCOUNTER — Ambulatory Visit (INDEPENDENT_AMBULATORY_CARE_PROVIDER_SITE_OTHER): Admitting: Internal Medicine

## 2023-12-01 ENCOUNTER — Encounter: Payer: Self-pay | Admitting: Internal Medicine

## 2023-12-01 ENCOUNTER — Ambulatory Visit: Admitting: Internal Medicine

## 2023-12-01 VITALS — BP 124/82 | HR 60 | Ht 65.0 in | Wt 225.0 lb

## 2023-12-01 DIAGNOSIS — Z87891 Personal history of nicotine dependence: Secondary | ICD-10-CM

## 2023-12-01 DIAGNOSIS — Z86711 Personal history of pulmonary embolism: Secondary | ICD-10-CM | POA: Diagnosis not present

## 2023-12-01 DIAGNOSIS — D860 Sarcoidosis of lung: Secondary | ICD-10-CM

## 2023-12-01 DIAGNOSIS — J479 Bronchiectasis, uncomplicated: Secondary | ICD-10-CM

## 2023-12-01 LAB — PULMONARY FUNCTION TEST
DL/VA % pred: 69 %
DL/VA: 2.95 ml/min/mmHg/L
DLCO unc % pred: 33 %
DLCO unc: 7.21 ml/min/mmHg
FEF 25-75 Pre: 1.03 L/s
FEF2575-%Pred-Pre: 38 %
FEV1-%Pred-Pre: 35 %
FEV1-Pre: 1.02 L
FEV1FVC-%Pred-Pre: 102 %
FEV6-%Pred-Pre: 35 %
FEV6-Pre: 1.24 L
FEV6FVC-%Pred-Pre: 102 %
FVC-%Pred-Pre: 34 %
FVC-Pre: 1.24 L
Pre FEV1/FVC ratio: 82 %
Pre FEV6/FVC Ratio: 100 %

## 2023-12-01 NOTE — Patient Instructions (Signed)
Spiro/DLCO performed today. 

## 2023-12-01 NOTE — Patient Instructions (Addendum)
 Sarcoidosis of lung (HCC) High risk medication use Encounter for therapeutic drug monitoring Pulmonary fibrosis (HCC) Quit smoking > 20 pack  - progressive since 2017 but stable 2021 -> 2025 August on breathing test -  continues on methotrexate /folic acid  via rheumatology - on breztri  - stable dyspnea on exertiona  Plan'  - continue brezri, methotrexate  , folic acid  as before - HRCT Chest in 6 months  History of pulmonary embolus (PE)  Plan  - continue life long xarelto    Hx  of OSA  Plan  - Wear CPAP At bedtime    Follwup  6 months; 15 min visit in 6 months but after  CT chest  - symptom score and exercise hypoxemia test at followup

## 2023-12-01 NOTE — Progress Notes (Signed)
Spiro/DLCO performed today. 

## 2023-12-07 ENCOUNTER — Ambulatory Visit (INDEPENDENT_AMBULATORY_CARE_PROVIDER_SITE_OTHER): Admitting: Family Medicine

## 2023-12-07 VITALS — BP 124/80 | HR 84 | Temp 98.2°F | Ht 65.0 in | Wt 229.0 lb

## 2023-12-07 DIAGNOSIS — L918 Other hypertrophic disorders of the skin: Secondary | ICD-10-CM | POA: Diagnosis not present

## 2023-12-07 NOTE — Progress Notes (Signed)
    SUBJECTIVE:   CHIEF COMPLAINT / HPI:   Skin tag In right armpit, has been there for a long time. Does get irritated and is sometimes painful. May get caught on her clothes but she is not sure. Makes shaving difficult. She is currently on a blood thinner.  PERTINENT  PMH / PSH: Reviewed.  OBJECTIVE:   BP 124/80   Pulse 84   Temp 98.2 F (36.8 C)   Ht 5' 5 (1.651 m)   Wt 229 lb (103.9 kg)   LMP 04/01/2021 (Exact Date)   SpO2 92%   BMI 38.11 kg/m   General: well-appearing, no acute distress. Pulm: No increased work of breathing. Right axilla with small <0.25 cm skin tag; no skin breakdown, erythema, or warmth to the area.  ASSESSMENT/PLAN:   Assessment & Plan Skin tag Skin tag removal reasonable due to discomfort. Area was prepped with alcohol pad and sterile surgical scissors used to remove skin tag. Patient tolerated procedure well, no bleeding. - aftercare instruction and return precautions given    Lauraine Norse, DO [facility]

## 2023-12-07 NOTE — Patient Instructions (Signed)
Skin Tag Removal Wound Care instructions  . Sometimes bandages are not necessary.  If a bandage is used, leave the original bandage on for 24 hours if possible.  If the bandage becomes soaked or soiled before that time, it is OK to remove it and examine the wound.  A small amount of post-operative bleeding is normal.  If excessive bleeding occurs, remove the bandage, place gauze over the site and apply continuous pressure (no peeking) over the area for 20-30 minutes.  If this does not stop the bleeding, try again for 40 minutes.  If this does not work, please call our clinic as soon as possible (even if after hours).    . Once a day, cleanse the wound with soap and water.  If a thick crust develops you may use a Q-tip dipped into dilute hydrogen peroxide (mix 1:1 with water) to dissolve it.  Hydrogen peroxide can slow the healing process, so use it only as needed.  After washing, apply Vaseline jelly or Polysporin ointment.  For best healing, the wound should be covered with a layer of ointment at all times.  This may mean re-applying the ointment several times a day.  For open wounds, continue until it has healed.    . If you have any swelling, keep the area elevated.  . Some redness, tenderness and white or yellow material in the wound is normal healing.  If the area becomes very sore and red, or develops a thick yellow-green material (pus), it may be infected; please notify us.    . Wound healing continues for up to one year following surgery.  It is not unusual to experience pain in the scar from time to time during the interval.  If the pain becomes severe or the scar thickens, you should notify the office.  A slight amount of redness in a scar is expected for the first six months.  After six months, the redness subsides and the scar will soften and fade.  The color difference becomes less noticeable with time.  If there are any problems, return for a post-op surgery check at your earliest  convenience.  . It is important to wear sunscreen daily with an SPF of 30 or higher over the treated sites and to wear sun-protective clothing.  . Please call our office for any questions or concerns.  

## 2023-12-12 ENCOUNTER — Ambulatory Visit: Admitting: Nurse Practitioner

## 2023-12-14 ENCOUNTER — Ambulatory Visit: Payer: Self-pay | Admitting: Adult Health

## 2023-12-26 DIAGNOSIS — N189 Chronic kidney disease, unspecified: Secondary | ICD-10-CM | POA: Diagnosis not present

## 2023-12-26 DIAGNOSIS — E559 Vitamin D deficiency, unspecified: Secondary | ICD-10-CM | POA: Diagnosis not present

## 2023-12-26 DIAGNOSIS — R809 Proteinuria, unspecified: Secondary | ICD-10-CM | POA: Diagnosis not present

## 2023-12-26 DIAGNOSIS — D631 Anemia in chronic kidney disease: Secondary | ICD-10-CM | POA: Diagnosis not present

## 2023-12-28 DIAGNOSIS — D869 Sarcoidosis, unspecified: Secondary | ICD-10-CM | POA: Diagnosis not present

## 2023-12-28 DIAGNOSIS — N1831 Chronic kidney disease, stage 3a: Secondary | ICD-10-CM | POA: Diagnosis not present

## 2023-12-28 DIAGNOSIS — E559 Vitamin D deficiency, unspecified: Secondary | ICD-10-CM | POA: Diagnosis not present

## 2023-12-28 DIAGNOSIS — I129 Hypertensive chronic kidney disease with stage 1 through stage 4 chronic kidney disease, or unspecified chronic kidney disease: Secondary | ICD-10-CM | POA: Diagnosis not present

## 2024-01-18 NOTE — Progress Notes (Unsigned)
 Office Visit Note  Patient: Hailey Ross             Date of Birth: 1970/10/07           MRN: [id number]             PCP: Colette Torrence GRADE, MD Referring: Edman Meade PEDLAR, FNP Visit Date: 02/01/2024 Occupation: Data Unavailable  Subjective:  Medication monitoring   History of Present Illness: Hailey Ross is a 53 y.o. female with history of sarcoidosis and osteoarthritis.  Patient reports that she has been off of methotrexate  for about 4 to 6 weeks due to the pharmacy not refilling the prescription.  Patient has had several interruptions with methotrexate  due to methotrexate  not being on auto refill.  She tolerates methotrexate  without any side effects.  She does not notice any new or worsening symptoms while taking methotrexate .  She has not noticed any clinical benefit while taking methotrexate .  Patient continues to have intermittent flares involving the left knee.  She denies any mechanical symptoms.  She was evaluated by Dr. Jerri on 11/17/2023 at which time she had a right knee joint aspiration.  Patient typically has relief for about 2 months after a cortisone injection.  She denies any increased discomfort in the right knee.  She experiences some stiffness in both hips after sitting for prolonged periods of time.  She denies any other joint pain or joint swelling at this time.  Patient states that she has occasional coughing but denies any shortness of breath.  The patient remains under care of ophthalmology yearly basis.  She denies any signs or symptoms of uveitis.  She denies any recent rashes.  Activities of Daily Living:  Patient reports morning stiffness for 15-20 minutes.   Patient Denies nocturnal pain.  Difficulty dressing/grooming: Denies Difficulty climbing stairs: Reports Difficulty getting out of chair: Denies Difficulty using hands for taps, buttons, cutlery, and/or writing: Reports  Review of Systems  Constitutional:  Negative for fatigue.  HENT:  Negative for  mouth sores and mouth dryness.   Eyes:  Negative for dryness.  Respiratory:  Negative for shortness of breath.   Cardiovascular:  Negative for chest pain and palpitations.  Gastrointestinal:  Positive for constipation and diarrhea. Negative for blood in stool.  Endocrine: Negative for increased urination.  Genitourinary:  Negative for involuntary urination.  Musculoskeletal:  Positive for joint pain, joint pain, joint swelling, myalgias, morning stiffness and myalgias. Negative for gait problem, muscle weakness and muscle tenderness.  Skin:  Negative for color change, rash, hair loss and sensitivity to sunlight.  Allergic/Immunologic: Negative for susceptible to infections.  Neurological:  Negative for dizziness and headaches.  Hematological:  Negative for swollen glands.  Psychiatric/Behavioral:  Negative for depressed mood and sleep disturbance. The patient is not nervous/anxious.     PMFS History:  Patient Active Problem List   Diagnosis Date Noted   Asthmatic bronchitis , chronic (HCC) 06/12/2023   Abscess 05/26/2023   Prediabetes 02/20/2023   Multiple cysts of breast 02/17/2023   Overactive bladder 01/18/2023   Chronic anticoagulation 12/06/2022   Epistaxis 12/06/2022   Acute frontal sinusitis 12/06/2022   Acute respiratory failure with hypoxia (HCC) 12/05/2022   Gastroparesis 11/29/2022   Colitis 10/18/2022   Other constipation 10/04/2022   Upper abdominal pain 04/29/2022   Early satiety 04/29/2022   Acute bronchitis 03/21/2022   Immunization due 12/17/2021   Need for immunization against influenza 12/17/2021   Fatigue 12/17/2021   Encounter for annual general medical  examination with abnormal findings in adult 08/30/2021   CAP (community acquired pneumonia) 06/16/2021   Acute midline low back pain without sciatica 04/22/2021   Left lower quadrant abdominal pain 04/20/2021   Atypical chest pain 01/26/2021   Synovitis of left knee 01/19/2021   GERD (gastroesophageal  reflux disease) 12/29/2020   Abdominal pain, epigastric 12/28/2020   Primary osteoarthritis of left knee 10/27/2020   Grief at loss of child 06/09/2020   Insomnia 06/09/2020   Right ankle swelling 02/05/2020   Encounter for examination following treatment at hospital 01/28/2020   Palpitations 01/28/2020   Menorrhagia with regular cycle 10/15/2019   OSA (obstructive sleep apnea) 10/08/2019   Right knee pain 08/29/2019   Positive ANA (antinuclear antibody) 08/29/2019   Essential hypertension 07/04/2019   Pain and swelling of right knee 07/04/2019   Hyperlipidemia 07/04/2019   Stage 2 chronic kidney disease 07/04/2019   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   Encounter for support and coordination of transition of care 07/04/2019   Daytime hypersomnolence 07/02/2019   Pulmonary hypertension (HCC) 07/02/2019   CKD (chronic kidney disease)    Bilateral pulmonary embolism (HCC) 06/18/2019   Lightheadedness 05/10/2019   Vitamin D  deficiency 05/10/2019   Obesity (BMI 30-39.9) 04/16/2019   Sarcoidosis 05/04/2015   Bronchiectasis (HCC) 05/04/2015    Past Medical History:  Diagnosis Date   Acute cholecystitis 08/24/2016   Acute kidney injury superimposed on chronic kidney disease 06/20/2019   Acute respiratory failure with hypoxia (HCC) 06/19/2019   Arthritis    right knee   Bronchiectasis (HCC)    CKD (chronic kidney disease)    Iron deficiency    PE (pulmonary thromboembolism) (HCC) 06/2019   Pre-syncope 04/16/2019   Sarcoidosis    Sleep apnea    c pap   Smoker 02/12/2014    Family History  Problem Relation Age of Onset   Hypertension Mother    Cancer Mother    Colon polyps Mother    Healthy Sister    Healthy Sister    Healthy Sister    Healthy Sister    Healthy Daughter    Healthy Son    Healthy Son    Colon cancer Neg Hx    Esophageal cancer Neg Hx    Rectal cancer Neg Hx    Stomach cancer Neg Hx    Past Surgical History:  Procedure Laterality Date   CHOLECYSTECTOMY  N/A 08/25/2016   Procedure: LAPAROSCOPIC CHOLECYSTECTOMY;  Surgeon: Vernetta Berg, MD;  Location: [facility] OR;  Service: General;  Laterality: N/A;   TUBAL LIGATION  02/1993   Social History   Tobacco Use   Smoking status: Former    Current packs/day: 0.00    Average packs/day: 1 pack/day for 20.0 years (20.0 ttl pk-yrs)    Types: Cigarettes    Start date: 03/21/1995    Quit date: 03/21/2015    Years since quitting: 8.8    Passive exposure: Past   Smokeless tobacco: Never  Vaping Use   Vaping status: Never Used  Substance Use Topics   Alcohol use: Not Currently    Comment: Rare   Drug use: Never   Social History   Social History Narrative   Live with partners [name]-19 years   3 children.    Daughter passed in 2022. She is raising her grandkids 66 and 69 y.o boy and girl.    Has 2 sons in [place] and 71. (As of 10/2023)      Enjoy: read, play games on tablet, grandkids  Diet: Veggies, does not eat a lot of fried foods, enjoys chicken and fruit   Caffeine: sodas and coffee (with sugar)   Water: Does not drink a lot at all      Wears seat beat   Does not wear sunscreen   Smoke and carbon monoxide detectors   Does not use phone while driving      Immunization History  Administered Date(s) Administered   Influenza Inj Mdck Quad Pf 12/25/2019   Influenza,inj,Quad PF,6+ Mos 12/21/2018, 02/22/2021, 12/17/2021   Influenza,inj,Quad PF,6-35 Mos 01/10/2019   Influenza-Unspecified 12/20/2022   PFIZER(Purple Top)SARS-COV-2 Vaccination 08/14/2019, 09/10/2019, 02/07/2020   PNEUMOCOCCAL CONJUGATE-20 10/26/2023   Pneumococcal Polysaccharide-23 02/12/2014   Tdap 02/12/2014   Zoster Recombinant(Shingrix ) 12/17/2021, 10/18/2022     Objective: Vital Signs: BP 130/89   Pulse 65   Temp 97.9 F (36.6 C)   Resp 14   Ht 5' 5 (1.651 m)   Wt 233 lb 12.8 oz (106.1 kg)   LMP 04/01/2021 (Exact Date)   BMI 38.91 kg/m    Physical Exam Vitals and nursing note reviewed.   Constitutional:      Appearance: She is well-developed.  HENT:     Head: Normocephalic and atraumatic.  Eyes:     Conjunctiva/sclera: Conjunctivae normal.  Cardiovascular:     Rate and Rhythm: Normal rate and regular rhythm.     Heart sounds: Normal heart sounds.  Pulmonary:     Effort: Pulmonary effort is normal.     Breath sounds: Normal breath sounds.  Abdominal:     General: Bowel sounds are normal.     Palpations: Abdomen is soft.  Musculoskeletal:     Cervical back: Normal range of motion.  Lymphadenopathy:     Cervical: No cervical adenopathy.  Skin:    General: Skin is warm and dry.     Capillary Refill: Capillary refill takes less than 2 seconds.  Neurological:     Mental Status: She is alert and oriented to person, place, and time.  Psychiatric:        Behavior: Behavior normal.      Musculoskeletal Exam: C-spine, thoracic spine, lumbar spine have good range of motion.  No midline spinal tenderness.  No SI joint tenderness.  Shoulder joints, elbow joints, wrist joints, MCPs, PIPs, DIPs have good range of motion with no synovitis.  Complete fist formation bilaterally.  Hip joints have good range of motion with no groin pain.  Synovial thickening of the left knee with discomfort with range of motion.  Right knee joint has good range of motion no warmth or effusion.  Ankle joints have good range of motion no tenderness or joint swelling.  No evidence of Achilles tendinitis or plantar fasciitis.   CDAI Exam: CDAI Score: -- Patient Global: --; Provider Global: -- Swollen: --; Tender: -- Joint Exam 02/01/2024   No joint exam has been documented for this visit   There is currently no information documented on the homunculus. Go to the Rheumatology activity and complete the homunculus joint exam.  Investigation: No additional findings.  Imaging: No results found.  Recent Labs: Lab Results  Component Value Date   WBC 9.5 09/01/2023   HGB 12.9 09/01/2023   PLT  325 09/01/2023   NA 141 09/01/2023   K 4.8 09/01/2023   CL 107 09/01/2023   CO2 27 09/01/2023   GLUCOSE 86 09/01/2023   BUN 15 09/01/2023   CREATININE 1.23 (H) 09/01/2023   BILITOT 0.4 09/01/2023   ALKPHOS 98 04/25/2023  AST 21 09/01/2023   ALT 14 09/01/2023   PROT 7.0 09/01/2023   ALBUMIN  4.1 04/25/2023   CALCIUM  9.4 09/01/2023   GFRAA 62 09/23/2020   QFTBGOLD Negative 05/01/2015   QFTBGOLDPLUS NEGATIVE 11/08/2019    Speciality Comments: No specialty comments available.  Procedures:  No procedures performed Allergies: Patient has no known allergies.    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 Chest x-ray updated on 06/12/2023: No acute radiographic findings.  Chronic subpleural reticulation and interstitial coarsening, not significantly changed.  A high-resolution chest CT has been ordered by Dr. Geronimo.No new or worsening pulmonary symptoms.  Evaluated by Dr. Geronimo on 12/01/2023-recommended continuing methotrexate  and folic acid . Progressive since 2017 but stable from 2021-2025.  Discussed the importance of following up with ophthalmology on a yearly basis.  No signs or symptoms of uveitis. No skin manifestations consistent with sarcoidosis. Patient continues to experience intermittent pain and swelling in the left knee.  She is not having any other signs or symptoms of inflammatory Tritus.  She was previously prescribed methotrexate  4 tablets by mouth once weekly by pulmonology.  She has been out of the prescription for the past 4 to 6 weeks.  According to the patient her left knee pain and inflammation do not improve while taking methotrexate  consistently.  She is okay with restarting methotrexate  pending lab results. Plan to update lab work today and we will send in a refill of methotrexate  pending results. She was  advised to notify us  if she develops any new or worsening symptoms.  Positive ANA (antinuclear antibody) - Low titer positive ANA, ENA negative, anticardiolipin negative, beta-2 GP 1 negative, lupus anticoagulant negative (lupus anticoagulant was positive once).  High risk medication use - Methotrexate  4 tablets p.o. weekly along with folic acid  1 mg p.o. daily-started by pulmonology.  CBC and CMP updated 09/01/23. Orders for CBC and CMP released today.  Discussed the importance of holding methotrexate  if she develops signs or symptoms of an infection and to resume once the infection has completely cleared.   - Plan: CBC with Differential/Platelet, Comprehensive metabolic panel with GFR  Primary osteoarthritis of both hands - X-rays of both hands were obtained on 11/08/2019 which were consistent with osteoarthritis.  No erosive changes were noted.  No synovitis noted on examination today.  Trochanteric bursitis of both hips: Not currently symptomatic.  She has occasional stiffness in both hips after sitting for prolonged periods of time.  No groin pain at this time.  Effusion, left knee - Followed by Dr. Jerri.  X-rays of the left knee were updated on 01/28/2022 which were consistent with mild osteoarthritis and minimal periarticular spurring. XR updated on 11/17/23 no acute or structural abnormalities.  Mild arthritic changes.  She had a left knee aspiration on 11/17/23 performed by Dr. Jerri.   Chronic pain of left knee: Aspiration performed on 11/17/23.   Other medical conditions are listed as follows:   Stage 3a chronic kidney disease (HCC) - Followed by Dr. Rachele.  CKD since 2017.  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 Juanito office visit note on 09/15/21. Creatinine 1.23 and Gfr is low-53 on 09/01/23.  Plan to check CMP with GFR today.   Pulmonary hypertension (HCC)  Bilateral pulmonary embolism (HCC) - Unprovoked.  She is on anticoagulation-xarelto  lifelong.  She is followed by  hematology.  Essential hypertension: BP was 130/89  today in the office.    Bronchiectasis without complication (HCC) - Under the care of Dr. Geronimo.   Mixed hyperlipidemia  Secondary hyperparathyroidism  Vitamin D  deficiency  OSA (obstructive sleep apnea)  Daytime hypersomnolence  Orders: Orders Placed This Encounter  Procedures   CBC with Differential/Platelet   Comprehensive metabolic panel with GFR   No orders of the defined types were placed in this encounter.   Follow-Up Instructions: Return in about 5 months (around 07/01/2024) for Sarcoidosis, Osteoarthritis.   Waddell CHRISTELLA Craze, PA-C  Note - This record has been created using Dragon software.  Chart creation errors have been sought, but may not always  have been located. Such creation errors do not reflect on  the standard of medical care.

## 2024-01-30 ENCOUNTER — Encounter: Payer: Self-pay | Admitting: Internal Medicine

## 2024-01-30 MED ORDER — RIVAROXABAN 20 MG PO TABS: 20.0000 mg | ORAL_TABLET | Freq: Every day | ORAL | 3 refills | Status: AC

## 2024-01-31 ENCOUNTER — Telehealth: Payer: Self-pay | Admitting: *Deleted

## 2024-01-31 ENCOUNTER — Other Ambulatory Visit (HOSPITAL_COMMUNITY): Payer: Self-pay

## 2024-01-31 NOTE — Telephone Encounter (Signed)
 She was on her husband's insurance, however he lost his job and she was on his insurance.  She has medicare, but did not have medication coverage, it will kick in on 02/10/24.  She runs out of xarelto  on 10/25. I advised her that we do not have samples of xarelto .  I looked it up on [employer] and found a coupon for [facility] [place]:    Price with [employer], email, or text this coupon to yourself. [facility] $ 72.53 Retail price:  [phone] Show this coupon at the pharmacy. BIN [id number] PCN [employer] Member ID [id number]  I text ed it to her phone from the computer and she received it.   Advised to show it to [facility] at [facility] and she will get it for $72.53.  She verbalized understanding.  She was very Adult nurse.  Nothing further needed.

## 2024-01-31 NOTE — Telephone Encounter (Signed)
 Do we have sample xarelto  or financial assistance

## 2024-01-31 NOTE — Telephone Encounter (Signed)
 Copied from CRM [id number]. Topic: Clinical - Medication Question >> Jan 30, 2024  2:20 PM Isabell A wrote: Reason for CRM: Patient states she wont be able to get insurance until November 1st, is there any way she can receive a sample of XARELTO  20 MG TABS tablet to last her until then.  Callback number: [phone]  ATC x1.  LVM to return call.  We do not have samples of xarelto .

## 2024-01-31 NOTE — Telephone Encounter (Signed)
 Has been handled.NFN

## 2024-01-31 NOTE — Telephone Encounter (Signed)
 Copied from CRM [id number]. Topic: Clinical - Medication Question >> Jan 30, 2024  2:20 PM Isabell A wrote: Reason for CRM: Patient states she wont be able to get insurance until November 1st, is there any way she can receive a sample of XARELTO  20 MG TABS tablet to last her until then.  Callback number: [phone] >> Jan 31, 2024  4:16 PM Chantha C wrote: Patient is returning Orvan Papadakis's call. Per CAL, warm transferred to CMA.

## 2024-02-01 ENCOUNTER — Encounter: Payer: Self-pay | Admitting: Physician Assistant

## 2024-02-01 ENCOUNTER — Telehealth: Payer: Self-pay

## 2024-02-01 ENCOUNTER — Ambulatory Visit: Attending: Physician Assistant | Admitting: Physician Assistant

## 2024-02-01 VITALS — BP 130/89 | HR 65 | Temp 97.9°F | Resp 14 | Ht 65.0 in | Wt 233.8 lb

## 2024-02-01 DIAGNOSIS — M25462 Effusion, left knee: Secondary | ICD-10-CM | POA: Diagnosis present

## 2024-02-01 DIAGNOSIS — G4719 Other hypersomnia: Secondary | ICD-10-CM | POA: Insufficient documentation

## 2024-02-01 DIAGNOSIS — I10 Essential (primary) hypertension: Secondary | ICD-10-CM | POA: Diagnosis present

## 2024-02-01 DIAGNOSIS — D869 Sarcoidosis, unspecified: Secondary | ICD-10-CM | POA: Diagnosis present

## 2024-02-01 DIAGNOSIS — M7061 Trochanteric bursitis, right hip: Secondary | ICD-10-CM | POA: Diagnosis present

## 2024-02-01 DIAGNOSIS — M7062 Trochanteric bursitis, left hip: Secondary | ICD-10-CM | POA: Diagnosis present

## 2024-02-01 DIAGNOSIS — G8929 Other chronic pain: Secondary | ICD-10-CM | POA: Diagnosis present

## 2024-02-01 DIAGNOSIS — M19041 Primary osteoarthritis, right hand: Secondary | ICD-10-CM | POA: Insufficient documentation

## 2024-02-01 DIAGNOSIS — E559 Vitamin D deficiency, unspecified: Secondary | ICD-10-CM | POA: Insufficient documentation

## 2024-02-01 DIAGNOSIS — M19042 Primary osteoarthritis, left hand: Secondary | ICD-10-CM | POA: Insufficient documentation

## 2024-02-01 DIAGNOSIS — Z79899 Other long term (current) drug therapy: Secondary | ICD-10-CM | POA: Diagnosis not present

## 2024-02-01 DIAGNOSIS — N1831 Chronic kidney disease, stage 3a: Secondary | ICD-10-CM | POA: Diagnosis present

## 2024-02-01 DIAGNOSIS — J479 Bronchiectasis, uncomplicated: Secondary | ICD-10-CM | POA: Diagnosis present

## 2024-02-01 DIAGNOSIS — G4733 Obstructive sleep apnea (adult) (pediatric): Secondary | ICD-10-CM | POA: Diagnosis present

## 2024-02-01 DIAGNOSIS — I272 Pulmonary hypertension, unspecified: Secondary | ICD-10-CM | POA: Insufficient documentation

## 2024-02-01 DIAGNOSIS — I2699 Other pulmonary embolism without acute cor pulmonale: Secondary | ICD-10-CM | POA: Insufficient documentation

## 2024-02-01 DIAGNOSIS — E782 Mixed hyperlipidemia: Secondary | ICD-10-CM | POA: Insufficient documentation

## 2024-02-01 DIAGNOSIS — N2581 Secondary hyperparathyroidism of renal origin: Secondary | ICD-10-CM | POA: Insufficient documentation

## 2024-02-01 DIAGNOSIS — R7689 Other specified abnormal immunological findings in serum: Secondary | ICD-10-CM | POA: Diagnosis not present

## 2024-02-01 DIAGNOSIS — M25562 Pain in left knee: Secondary | ICD-10-CM | POA: Diagnosis present

## 2024-02-01 LAB — CBC WITH DIFFERENTIAL/PLATELET
Absolute Lymphocytes: 3211 {cells}/uL (ref 850–3900)
Absolute Monocytes: 592 {cells}/uL (ref 200–950)
Basophils Absolute: 29 {cells}/uL (ref 0–200)
Basophils Relative: 0.3 %
Eosinophils Absolute: 204 {cells}/uL (ref 15–500)
Eosinophils Relative: 2.1 %
HCT: 41.8 % (ref 35.0–45.0)
Hemoglobin: 13 g/dL (ref 11.7–15.5)
MCH: 26.8 pg — ABNORMAL LOW (ref 27.0–33.0)
MCHC: 31.1 g/dL — ABNORMAL LOW (ref 32.0–36.0)
MCV: 86.2 fL (ref 80.0–100.0)
MPV: 10.2 fL (ref 7.5–12.5)
Monocytes Relative: 6.1 %
Neutro Abs: 5665 {cells}/uL (ref 1500–7800)
Neutrophils Relative %: 58.4 %
Platelets: 317 Thousand/uL (ref 140–400)
RBC: 4.85 Million/uL (ref 3.80–5.10)
RDW: 12.8 % (ref 11.0–15.0)
Total Lymphocyte: 33.1 %
WBC: 9.7 Thousand/uL (ref 3.8–10.8)

## 2024-02-01 LAB — COMPREHENSIVE METABOLIC PANEL WITH GFR
AG Ratio: 1.3 (calc) (ref 1.0–2.5)
ALT: 17 U/L (ref 6–29)
AST: 19 U/L (ref 10–35)
Albumin: 4.1 g/dL (ref 3.6–5.1)
Alkaline phosphatase (APISO): 88 U/L (ref 37–153)
BUN/Creatinine Ratio: 16 (calc) (ref 6–22)
BUN: 18 mg/dL (ref 7–25)
CO2: 27 mmol/L (ref 20–32)
Calcium: 9.6 mg/dL (ref 8.6–10.4)
Chloride: 107 mmol/L (ref 98–110)
Creat: 1.16 mg/dL — ABNORMAL HIGH (ref 0.50–1.03)
Globulin: 3.1 g/dL (ref 1.9–3.7)
Glucose, Bld: 84 mg/dL (ref 65–99)
Potassium: 4.2 mmol/L (ref 3.5–5.3)
Sodium: 141 mmol/L (ref 135–146)
Total Bilirubin: 0.5 mg/dL (ref 0.2–1.2)
Total Protein: 7.2 g/dL (ref 6.1–8.1)
eGFR: 56 mL/min/1.73m2 — ABNORMAL LOW (ref >=60)

## 2024-02-01 NOTE — Telephone Encounter (Signed)
 Per Waddell Craze, okay to send in methotrexate  4 tablets weekly pending lab results.   Per office note on 02/01/2024, She was previously prescribed methotrexate  4 tablets by mouth once weekly by pulmonology. She has been out of the prescription for the past 4 to 6 weeks. According to the patient her left knee pain and inflammation do not improve while taking methotrexate  consistently. She is okay with restarting methotrexate  pending lab results.

## 2024-02-01 NOTE — Patient Instructions (Signed)
 Standing Labs We placed an order today for your standing lab work.   Please have your standing labs drawn in January and every 3 months   Please have your labs drawn 2 weeks prior to your appointment so that the provider can discuss your lab results at your appointment, if possible.  Please note that you may see your imaging and lab results in MyChart before we have reviewed them. We will contact you once all results are reviewed. Please allow our office up to 72 hours to thoroughly review all of the results before contacting the office for clarification of your results.  WALK-IN LAB HOURS  Monday through Thursday from 8:00 am -12:30 pm and 1:00 pm-4:30 pm and Friday from 8:00 am-12:00 pm.  Patients with office visits requiring labs will be seen before walk-in labs.  You may encounter longer than normal wait times. Please allow additional time. Wait times may be shorter on  Monday and Thursday afternoons.  We do not book appointments for walk-in labs. We appreciate your patience and understanding with our staff.   Labs are drawn by [facility]. Please bring your co-pay at the time of your lab draw.  You may receive a bill from [facility] for your lab work.  Please note if you are on Hydroxychloroquine  and and an order has been placed for a Hydroxychloroquine  level,  you will need to have it drawn 4 hours or more after your last dose.  If you wish to have your labs drawn at another location, please call the office 24 hours in advance so we can fax the orders.  The office is located at [address], [address], [place], [place] [address]   If you have any questions regarding directions or hours of operation,  please call [phone].   As a reminder, please drink plenty of water prior to coming for your lab work. Thanks!

## 2024-02-02 ENCOUNTER — Other Ambulatory Visit (HOSPITAL_BASED_OUTPATIENT_CLINIC_OR_DEPARTMENT_OTHER): Payer: Self-pay

## 2024-02-02 ENCOUNTER — Ambulatory Visit: Admitting: Physician Assistant

## 2024-02-02 MED ORDER — BUDESONIDE-FORMOTEROL FUMARATE 80-4.5 MCG/ACT IN AERO: 2.0000 | INHALATION_SPRAY | Freq: Two times a day (BID) | RESPIRATORY_TRACT | 12 refills | Status: DC

## 2024-02-02 MED ORDER — RIVAROXABAN 20 MG PO TABS
20.0000 mg | ORAL_TABLET | Freq: Every day | ORAL | 3 refills | Status: DC
  Filled 2024-02-02: qty 30, 30d supply, fill #0
  Filled 2024-02-27: qty 30, 30d supply, fill #1

## 2024-02-02 MED ORDER — ROSUVASTATIN CALCIUM 5 MG PO TABS: 5.0000 mg | ORAL_TABLET | Freq: Every day | ORAL | 1 refills | Status: AC

## 2024-02-02 MED ORDER — CLINDAMYCIN PHOSPHATE 1 % EX SOLN: CUTANEOUS | 99 refills | Status: DC

## 2024-02-02 MED ORDER — PANTOPRAZOLE SODIUM 40 MG PO TBEC: 40.0000 mg | DELAYED_RELEASE_TABLET | Freq: Every day | ORAL | 3 refills | Status: DC

## 2024-02-02 MED ORDER — DOXYCYCLINE HYCLATE 100 MG PO CAPS: 100.0000 mg | ORAL_CAPSULE | Freq: Two times a day (BID) | ORAL | 2 refills | Status: AC | PRN

## 2024-02-04 ENCOUNTER — Ambulatory Visit: Payer: Self-pay | Admitting: Physician Assistant

## 2024-02-04 NOTE — Progress Notes (Signed)
 MCH and MCHC are low but stable. Rest of CBC WNL.   Creatinine remains elevated-1.16 and GFR is low but stable-56.  Avoid the use of NSAIDs.  Patient has been prescribed methotrexate  by pulmonology in the past--she will require close lab monitoring if she would like to resume low dose methotrexate .   She was taking 4 tablets weekly--she will require lab work every 3 months.   Ok to resume methotrexate  and folic acid -ok to send refills and recommend updating lab work in 1 month to make sure renal function remains stable.

## 2024-02-05 ENCOUNTER — Other Ambulatory Visit: Payer: Self-pay

## 2024-02-05 ENCOUNTER — Other Ambulatory Visit (HOSPITAL_BASED_OUTPATIENT_CLINIC_OR_DEPARTMENT_OTHER): Payer: Self-pay

## 2024-02-05 MED ORDER — METHOTREXATE SODIUM 2.5 MG PO TABS
10.0000 mg | ORAL_TABLET | ORAL | 0 refills | Status: AC
  Filled 2024-02-05: qty 48, 84d supply, fill #0

## 2024-02-05 MED ORDER — FOLIC ACID 1 MG PO TABS
1.0000 mg | ORAL_TABLET | Freq: Every day | ORAL | 0 refills | Status: DC
  Filled 2024-02-05: qty 90, 90d supply, fill #0

## 2024-02-05 NOTE — Telephone Encounter (Signed)
 Please review and sign

## 2024-02-05 NOTE — Telephone Encounter (Signed)
 Addressed in another lab call

## 2024-02-12 ENCOUNTER — Encounter: Payer: Self-pay | Admitting: Radiology

## 2024-02-15 ENCOUNTER — Other Ambulatory Visit (HOSPITAL_BASED_OUTPATIENT_CLINIC_OR_DEPARTMENT_OTHER): Payer: Self-pay

## 2024-02-16 ENCOUNTER — Other Ambulatory Visit (HOSPITAL_BASED_OUTPATIENT_CLINIC_OR_DEPARTMENT_OTHER): Payer: Self-pay

## 2024-02-19 ENCOUNTER — Ambulatory Visit: Admitting: Cardiology

## 2024-02-20 ENCOUNTER — Other Ambulatory Visit (HOSPITAL_BASED_OUTPATIENT_CLINIC_OR_DEPARTMENT_OTHER): Payer: Self-pay

## 2024-02-27 ENCOUNTER — Other Ambulatory Visit (HOSPITAL_BASED_OUTPATIENT_CLINIC_OR_DEPARTMENT_OTHER): Payer: Self-pay

## 2024-02-29 ENCOUNTER — Encounter: Payer: Self-pay | Admitting: Gastroenterology

## 2024-02-29 ENCOUNTER — Ambulatory Visit (INDEPENDENT_AMBULATORY_CARE_PROVIDER_SITE_OTHER): Admitting: Gastroenterology

## 2024-02-29 ENCOUNTER — Other Ambulatory Visit

## 2024-02-29 ENCOUNTER — Ambulatory Visit: Payer: Self-pay | Admitting: Gastroenterology

## 2024-02-29 VITALS — BP 126/76 | HR 61 | Ht 65.0 in | Wt 230.2 lb

## 2024-02-29 DIAGNOSIS — I2699 Other pulmonary embolism without acute cor pulmonale: Secondary | ICD-10-CM

## 2024-02-29 DIAGNOSIS — K5900 Constipation, unspecified: Secondary | ICD-10-CM | POA: Diagnosis not present

## 2024-02-29 DIAGNOSIS — I272 Pulmonary hypertension, unspecified: Secondary | ICD-10-CM | POA: Diagnosis not present

## 2024-02-29 DIAGNOSIS — R1030 Lower abdominal pain, unspecified: Secondary | ICD-10-CM

## 2024-02-29 DIAGNOSIS — J479 Bronchiectasis, uncomplicated: Secondary | ICD-10-CM

## 2024-02-29 DIAGNOSIS — E669 Obesity, unspecified: Secondary | ICD-10-CM

## 2024-02-29 LAB — TSH: TSH: 1.33 u[IU]/mL (ref 0.35–5.50)

## 2024-02-29 NOTE — Patient Instructions (Signed)
 _______________________________________________________  If your blood pressure at your visit was 140/90 or greater, please contact your primary care physician to follow up on this.  _______________________________________________________  If you are age 53 or older, your body mass index should be between 23-30. Your Body mass index is 38.32 kg/m. If this is out of the aforementioned range listed, please consider follow up with your Primary Care Provider.  If you are age 45 or younger, your body mass index should be between 19-25. Your Body mass index is 38.32 kg/m. If this is out of the aformentioned range listed, please consider follow up with your Primary Care Provider.   ________________________________________________________  The [facility] providers would like to encourage you to use MYCHART to communicate with providers for non-urgent requests or questions.  Due to long hold times on the telephone, sending your provider a message by [facility] may be a faster and more efficient way to get a response.  Please allow 48 business hours for a response.  Please remember that this is for non-urgent requests.  _______________________________________________________  [facility] is using a team-based approach to care.  Your team is made up of your doctor and two to three APPS. Our APPS (Nurse Practitioners and Physician Assistants) work with your physician to ensure care continuity for you. They are fully qualified to address your health concerns and develop a treatment plan. They communicate directly with your gastroenterologist to care for you. Seeing the Advanced Practice Practitioners on your physician's team can help you by facilitating care more promptly, often allowing for earlier appointments, access to diagnostic testing, procedures, and other specialty referrals.   Your provider has requested that you go to the basement level for lab work before leaving today. Press B on the  elevator. The lab is located at the first door on the left as you exit the elevator.  You have been scheduled for an appointment with Nestor Blower PA-C on 05-01-24 at 920am. Please arrive 10 minutes early for your appointment.  You have been scheduled for a CT scan of the abdomen and pelvis at [facility] ([address] [place], [place], [place] [address]).   You are scheduled on 03-12-24 at 230pm. You should arrive by 1230pm your appointment time for registration. Please follow the written instructions below on the day of your exam:  WARNING: IF YOU ARE ALLERGIC TO IODINE/X-RAY DYE, PLEASE NOTIFY RADIOLOGY IMMEDIATELY AT [phone]! YOU WILL BE GIVEN A 13 HOUR PREMEDICATION PREP.  1) Do not eat after 1230pm (4 hours prior to your test)  2) You will be given 2 bottles of oral contrast to drink on site.    Drink 1 bottle of contrast @ 1230pm (2 hours prior to your exam)  Drink 1 bottle of contrast @ 130pm (1 hour prior to your exam)  You may take any medications as prescribed with a small amount of water, if necessary. If you take any of the following medications: METFORMIN, GLUCOPHAGE, GLUCOVANCE, AVANDAMET, RIOMET, FORTAMET, ACTOPLUS MET, JANUMET, GLUMETZA or METAGLIP, you MAY be asked to HOLD this medication 48 hours AFTER the exam.  The purpose of you drinking the oral contrast is to aid in the visualization of your intestinal tract. The contrast solution may cause some diarrhea. Depending on your individual set of symptoms, you may also receive an intravenous injection of x-ray contrast/dye. Plan on being at [facility] for 30 minutes or longer, depending on the type of exam you are having performed.  This test typically takes 30-45  minutes to complete.  If you have any questions regarding your exam or if you need to reschedule, you may call the CT department at [phone] between the hours of 8:00 am and 5:00 pm, Monday-Friday.  Thank you,  Dr. Lynnie Bring

## 2024-02-29 NOTE — Progress Notes (Signed)
 Noted

## 2024-02-29 NOTE — Progress Notes (Signed)
 Chief Complaint: follow up Primary GI MD: Dr. Abran  HPI: 53 year old female history of palpitations, sarcoidosis/bronchiectasis, CKD stage III, thoracic aortic aneurysm, chronic lightheadedness, pulmonary hypertension, OSA on CPAP, PE (on anticoagulation), pulmonary hypertension, hyperlipidemia, presents for evaluation of constipation and loose stools   Discussed the use of AI scribe software for clinical note transcription with the patient, who gave verbal consent to proceed.  KAELEIGH WESTENDORF is a 53 year old female who presents with chronic constipation and abdominal pain.  She has experienced chronic constipation and abdominal pain since June 2025. Bowel movements are either loose or occur in very small increments, and she has not had a full bowel movement since June. Despite using Citrucel, a stool softener, and Miralax, she has not found relief.  The abdominal pain is described as a tightening band around her bowel, primarily on the lower left side, sometimes extending downward, and is accompanied by significant lower back pain lasting about two and a half weeks. She also experiences severe cramping in her upper abdomen, exacerbated by coughing or bending over.   Her past medical history includes sarcoidosis, which causes her to cough, and she is currently in menopause, having not had a menstrual cycle for three years.  A colonoscopy in 2021 was normal, and a CT scan of the abdomen in June 2024 showed some inflammation but no significant abnormalities. She expresses concern about her symptoms due to her mother's history of pancreatic cancer. No changes in water intake or diet and no new medications. Her thyroid  was checked last year and was normal.  Past Medical History:  Diagnosis Date   Acute cholecystitis 08/24/2016   Acute kidney injury superimposed on chronic kidney disease 06/20/2019   Acute respiratory failure with hypoxia (HCC) 06/19/2019   Arthritis    right knee    Bronchiectasis (HCC)    CKD (chronic kidney disease)    Iron deficiency    PE (pulmonary thromboembolism) (HCC) 06/2019   Pre-syncope 04/16/2019   Sarcoidosis    Sleep apnea    c pap   Smoker 02/12/2014    Past Surgical History:  Procedure Laterality Date   CHOLECYSTECTOMY N/A 08/25/2016   Procedure: LAPAROSCOPIC CHOLECYSTECTOMY;  Surgeon: Vernetta Berg, MD;  Location: [facility] OR;  Service: General;  Laterality: N/A;   TUBAL LIGATION  02/1993    Current Outpatient Medications  Medication Sig Dispense Refill   acetaminophen  (TYLENOL ) 500 MG tablet Take 500 mg by mouth every 4 (four) hours as needed for mild pain, moderate pain or headache.      albuterol  (VENTOLIN  HFA) 108 (90 Base) MCG/ACT inhaler Inhale 2 puffs into the lungs every 6 (six) hours as needed for wheezing or shortness of breath. 8 g 0   budesonide -formoterol  (SYMBICORT ) 80-4.5 MCG/ACT inhaler Take 2 puffs first thing in am and then another 2 puffs about 12 hours later. 1 each 12   budesonide -formoterol  (SYMBICORT ) 80-4.5 MCG/ACT inhaler Inhale 2 puffs into the lungs first thing in the morning and then 2 puffs about 12 hours later. 10.2 g 12   budesonide -glycopyrrolate-formoterol  (BREZTRI  AEROSPHERE) 160-9-4.8 MCG/ACT AERO inhaler Inhale 2 puffs into the lungs 2 (two) times daily.     clindamycin  (CLEOCIN  T) 1 % external solution Apply to affected area up to 2 times daily as needed. 60 mL 99   diltiazem  (CARDIZEM ) 30 MG tablet Take 1 tablet (30 mg total) by mouth daily as needed (For palpitations). May take an additional 30 mg tablet daily as needed for palpitations 135 tablet  1   doxycycline  (VIBRAMYCIN ) 100 MG capsule Take 1 capsule (100 mg total) by mouth 2 (two) times daily as needed with food and water (sun warning). 60 capsule 2   folic acid  (FOLVITE ) 1 MG tablet Take 1 tablet (1 mg total) by mouth daily. 90 tablet 0   LORazepam (ATIVAN) 0.5 MG tablet Take 0.5 mg by mouth as needed for anxiety.     methotrexate   (RHEUMATREX) 2.5 MG tablet Take 4 tablets (10 mg total) by mouth once a week. Caution:Chemotherapy. Protect from light. 48 tablet 0   pantoprazole  (PROTONIX ) 40 MG tablet Take 1 tablet (40 mg total) by mouth daily. 90 tablet 3   rivaroxaban  (XARELTO ) 20 MG TABS tablet Take 1 tablet (20 mg total) by mouth daily with supper. 90 tablet 3   rosuvastatin  (CRESTOR ) 5 MG tablet Take 1 tablet (5 mg total) by mouth at bedtime. 90 tablet 1   Vitamin D , Ergocalciferol , (DRISDOL ) 1.25 MG (50000 UNIT) CAPS capsule Take 1 capsule (50,000 Units total) by mouth every 7 (seven) days. 20 capsule 1   rivaroxaban  (XARELTO ) 20 MG TABS tablet Take 1 tablet (20 mg total) by mouth daily with supper. (Patient not taking: Reported on 02/29/2024) 90 tablet 3   rosuvastatin  (CRESTOR ) 5 MG tablet Take 1 tablet (5 mg total) by mouth at bedtime. (Patient not taking: Reported on 02/29/2024) 90 tablet 1   No current facility-administered medications for this visit.    Allergies as of 02/29/2024   (No Known Allergies)    Family History  Problem Relation Age of Onset   Hypertension Mother    Cancer Mother    Colon polyps Mother    Healthy Sister    Healthy Sister    Healthy Sister    Healthy Sister    Healthy Daughter    Healthy Son    Healthy Son    Colon cancer Neg Hx    Esophageal cancer Neg Hx    Rectal cancer Neg Hx    Stomach cancer Neg Hx     Social History   Socioeconomic History   Marital status: Married    Spouse name: Not on file   Number of children: 3   Years of education: Not on file   Highest education level: High school graduate  Occupational History   Occupation: administrator, 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    Daughter passed in 2022. She is raising her grandkids 43 and 23 y.o boy and girl.    Has 2 sons in [place] and 84. (As of 10/2023)      Enjoy: read, play games on tablet, grandkids      Diet: Veggies, does not eat a lot of fried foods, enjoys chicken and fruit   Caffeine: sodas and coffee (with  sugar)   Water: Does not drink a lot at all      Wears seat beat   Does not wear sunscreen   Smoke and carbon monoxide detectors   Does not use phone while driving    Social Drivers of Corporate Investment Banker Strain: Low Risk  (10/10/2018)   Overall Financial Resource Strain (CARDIA)    Difficulty of Paying Living Expenses: Not hard at all  Food Insecurity: No Food Insecurity (12/06/2022)   Hunger Vital Sign    Worried About Running Out of Food in the Last Year: Never true    Ran Out of Food in the Last Year: Never true  Transportation Needs: No Transportation Needs (12/06/2022)   PRAPARE - Administrator, Civil Service (Medical): No    Lack of Transportation (Non-Medical): No  Physical Activity: Sufficiently Active (10/10/2018)   Exercise Vital Sign    Days of Exercise per Week: 7 days    Minutes of Exercise per Session: 60 min  Stress: No Stress Concern Present (10/10/2018)   [employer] of Occupational Health - Occupational Stress Questionnaire    Feeling of Stress : Not at all  Social Connections: Unknown (08/24/2021)   Received from [facility]   Social Network    Social Network: Not on file  Intimate Partner Violence: Not At Risk (12/06/2022)   Humiliation, Afraid, Rape, and Kick questionnaire    Fear of Current or Ex-Partner: No    Emotionally Abused: No     Physically Abused: No    Sexually Abused: No    Review of Systems:    Constitutional: No weight loss, fever, chills, weakness or fatigue HEENT: Eyes: No change in vision               Ears, Nose, Throat:  No change in hearing or congestion Skin: No rash or itching Cardiovascular: No chest pain, chest pressure or palpitations   Respiratory: No SOB or cough Gastrointestinal: See HPI and otherwise negative Genitourinary: No dysuria or change in urinary frequency Neurological: No headache, dizziness or syncope Musculoskeletal: No new muscle or joint pain Hematologic: No bleeding or bruising Psychiatric: No history of depression or anxiety    Physical Exam:  Vital signs: BP 126/76   Pulse 61   Ht 5' 5 (1.651 m)   Wt 230 lb 4 oz (104.4 kg)   LMP 04/01/2021 (Exact Date)   SpO2 95%   BMI 38.32 kg/m   Constitutional: NAD, alert and cooperative Head:  Normocephalic and atraumatic. Eyes:   PEERL, EOMI. No icterus. Conjunctiva pink. Respiratory: Respirations even and unlabored. Lungs clear to auscultation bilaterally.   No wheezes, crackles, or rhonchi.  Cardiovascular:  Regular rate and rhythm. No peripheral edema, cyanosis or pallor.  Gastrointestinal:  Soft, nondistended, nontender. No rebound or guarding. Normal bowel sounds. No appreciable masses or hepatomegaly.  Negative Carnett's sign Rectal:  Declines Msk:  Symmetrical without gross deformities. Without edema, no deformity or joint abnormality.  Neurologic:  Alert and  oriented x4;  grossly normal neurologically.  Skin:   Dry and intact without significant lesions or rashes. Psychiatric: Oriented to person, place and time. Demonstrates good judgement and reason without abnormal affect or behaviors.   RELEVANT LABS AND IMAGING: CBC    Component Value Date/Time   WBC 9.7 02/01/2024 0933   RBC 4.85 02/01/2024 0933   HGB 13.0 02/01/2024 0933   HGB 13.0 01/17/2023 1045   HCT 41.8 02/01/2024 0933   HCT 41.4 01/17/2023 1045  PLT 317 02/01/2024 0933   PLT 374 01/17/2023 1045   MCV 86.2 02/01/2024 0933   MCV 87 01/17/2023 1045   MCH 26.8 (L) 02/01/2024 0933   MCHC 31.1 (L) 02/01/2024 0933   RDW 12.8 02/01/2024 0933   RDW 12.8 01/17/2023 1045   LYMPHSABS 3.5 (H) 01/17/2023 1045   MONOABS 0.4 12/05/2022 2159   EOSABS 204 02/01/2024 0933   EOSABS 0.0 01/17/2023 1045   BASOSABS 29 02/01/2024 0933   BASOSABS 0.0 01/17/2023 1045    CMP     Component Value Date/Time   NA 141 02/01/2024 0933   NA 146 (H) 01/17/2023 1045   K 4.2 02/01/2024 0933   CL 107 02/01/2024 0933   CO2 27 02/01/2024 0933   GLUCOSE 84 02/01/2024 0933   BUN 18 02/01/2024 0933   BUN 17 01/17/2023 1045   CREATININE 1.16 (H) 02/01/2024 0933   CALCIUM  9.6 02/01/2024 0933   PROT 7.2 02/01/2024 0933   PROT 6.7 01/17/2023 1045   ALBUMIN  4.1 04/25/2023 1149   ALBUMIN  4.0 01/17/2023 1045   AST 19 02/01/2024 0933   ALT 17 02/01/2024 0933   ALKPHOS 98 04/25/2023 1149   BILITOT 0.5 02/01/2024 0933   BILITOT 0.4 01/17/2023 1045   GFRNONAA 50 (L) 12/05/2022 2159   GFRNONAA 53 (L) 09/23/2020 1458   GFRAA 62 09/23/2020 1458     Assessment/Plan:   Lower abdominal pain Lower back pain Constipation Continue lower abdominal pain and constipation despite trial of Citrucel, stool softeners, MiraLAX.  Normal thyroid .  Patient is extremely concerned about this pain with CT from last year showing mild colitis as well as her mother's history of pancreatic cancer and she would like some cross-sectional imaging.  Reassuring colonoscopy in 2021.  She feels her stress about her symptoms further propel her symptoms - Trial of Linzess 145 mcg - CT abdomen pelvis with contrast for patient reassurance - IBgard for pain - Follow-up with me in 8 weeks  Upper abdominal pain Upper abdominal pain when coughing or bending over and worse with movement.  Suspect abdominal wall pain. Normal EGD 2021 - Trial of Salonpas  Pulmonary  hypertension Sarcoidosis/bronchiectasis History of unprovoked PE OSA On Xarelto  and following with pulmonology  Thoracic aortic aneurysm Recent CT imaging revealing ascending aorta measuring 3.9 cm, stable  CKD stage III    Nestor Mollie RIGGERS [facility] 02/29/2024, 10:11 AM  Cc: Colette Torrence GRADE, MD

## 2024-03-04 ENCOUNTER — Other Ambulatory Visit (HOSPITAL_BASED_OUTPATIENT_CLINIC_OR_DEPARTMENT_OTHER): Payer: Self-pay

## 2024-03-12 ENCOUNTER — Ambulatory Visit (HOSPITAL_COMMUNITY)

## 2024-03-20 ENCOUNTER — Other Ambulatory Visit (HOSPITAL_BASED_OUTPATIENT_CLINIC_OR_DEPARTMENT_OTHER): Payer: Self-pay

## 2024-03-20 ENCOUNTER — Ambulatory Visit (HOSPITAL_COMMUNITY)

## 2024-03-24 ENCOUNTER — Encounter: Payer: Self-pay | Admitting: Family Medicine

## 2024-03-25 ENCOUNTER — Other Ambulatory Visit: Payer: Self-pay | Admitting: Family Medicine

## 2024-03-25 DIAGNOSIS — E559 Vitamin D deficiency, unspecified: Secondary | ICD-10-CM

## 2024-03-27 ENCOUNTER — Ambulatory Visit (HOSPITAL_COMMUNITY)
Admission: RE | Admit: 2024-03-27 | Discharge: 2024-03-27 | Disposition: A | Discharge status: Home / Self Care | Source: Ambulatory Visit | Attending: Gastroenterology | Admitting: Gastroenterology

## 2024-03-27 DIAGNOSIS — K5900 Constipation, unspecified: Secondary | ICD-10-CM | POA: Diagnosis present

## 2024-03-27 DIAGNOSIS — R1030 Lower abdominal pain, unspecified: Secondary | ICD-10-CM | POA: Insufficient documentation

## 2024-03-27 MED ORDER — IOHEXOL 9 MG/ML PO SOLN: 1000.0000 mL | ORAL | Status: AC

## 2024-03-27 MED ORDER — IOHEXOL 300 MG/ML  SOLN
100.0000 mL | Freq: Once | INTRAMUSCULAR | Status: AC | PRN
  Administered 2024-03-27: 18:00:00 100 mL via INTRAVENOUS

## 2024-03-28 ENCOUNTER — Ambulatory Visit (HOSPITAL_COMMUNITY)

## 2024-04-02 ENCOUNTER — Ambulatory Visit: Payer: Self-pay | Admitting: Family Medicine

## 2024-04-02 LAB — VITAMIN D 25 HYDROXY (VIT D DEFICIENCY, FRACTURES): Vit D, 25-Hydroxy: 27.4 ng/mL — ABNORMAL LOW (ref 30.0–100.0)

## 2024-04-16 NOTE — Progress Notes (Unsigned)
 "  Office Visit Note  Patient: Hailey Ross             Date of Birth: 10/22/1970           MRN: [id number]             PCP: Colette Torrence GRADE, MD Referring: Colette Torrence GRADE, MD Visit Date: 04/25/2024 Occupation: Data Unavailable  Subjective:  No chief complaint on file.   History of Present Illness: TISHAWNA Ross is a 54 y.o. female ***     Activities of Daily Living:  Patient reports morning stiffness for *** {minute/hour:19697}.   Patient {ACTIONS;DENIES/REPORTS:21021675::Denies} nocturnal pain.  Difficulty dressing/grooming: {ACTIONS;DENIES/REPORTS:21021675::Denies} Difficulty climbing stairs: {ACTIONS;DENIES/REPORTS:21021675::Denies} Difficulty getting out of chair: {ACTIONS;DENIES/REPORTS:21021675::Denies} Difficulty using hands for taps, buttons, cutlery, and/or writing: {ACTIONS;DENIES/REPORTS:21021675::Denies}  No Rheumatology ROS completed.   PMFS History:  Patient Active Problem List   Diagnosis Date Noted   Asthmatic bronchitis , chronic (HCC) 06/12/2023   Abscess 05/26/2023   Prediabetes 02/20/2023   Multiple cysts of breast 02/17/2023   Overactive bladder 01/18/2023   Chronic anticoagulation 12/06/2022   Epistaxis 12/06/2022   Acute frontal sinusitis 12/06/2022   Acute respiratory failure with hypoxia (HCC) 12/05/2022   Gastroparesis 11/29/2022   Colitis 10/18/2022   Other constipation 10/04/2022   Upper abdominal pain 04/29/2022   Early satiety 04/29/2022   Acute bronchitis 03/21/2022   Immunization due 12/17/2021   Need for immunization against influenza 12/17/2021   Fatigue 12/17/2021   Encounter for annual general medical examination with abnormal findings in adult 08/30/2021   CAP (community acquired pneumonia) 06/16/2021   Acute midline low back pain without sciatica 04/22/2021   Left lower quadrant abdominal pain 04/20/2021   Atypical chest pain 01/26/2021   Synovitis of left knee 01/19/2021   GERD (gastroesophageal  reflux disease) 12/29/2020   Abdominal pain, epigastric 12/28/2020   Primary osteoarthritis of left knee 10/27/2020   Grief at loss of child 06/09/2020   Insomnia 06/09/2020   Right ankle swelling 02/05/2020   Encounter for examination following treatment at hospital 01/28/2020   Palpitations 01/28/2020   Menorrhagia with regular cycle 10/15/2019   OSA (obstructive sleep apnea) 10/08/2019   Right knee pain 08/29/2019   Positive ANA (antinuclear antibody) 08/29/2019   Essential hypertension 07/04/2019   Pain and swelling of right knee 07/04/2019   Hyperlipidemia 07/04/2019   Stage 2 chronic kidney disease 07/04/2019   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   Encounter for support and coordination of transition of care 07/04/2019   Daytime hypersomnolence 07/02/2019   Pulmonary hypertension (HCC) 07/02/2019   CKD (chronic kidney disease)    Bilateral pulmonary embolism (HCC) 06/18/2019   Lightheadedness 05/10/2019   Vitamin D  deficiency 05/10/2019   Obesity (BMI 30-39.9) 04/16/2019   Sarcoidosis 05/04/2015   Bronchiectasis (HCC) 05/04/2015    Past Medical History:  Diagnosis Date   Acute cholecystitis 08/24/2016   Acute kidney injury superimposed on chronic kidney disease 06/20/2019   Acute respiratory failure with hypoxia (HCC) 06/19/2019   Arthritis    right knee   Bronchiectasis (HCC)    CKD (chronic kidney disease)    Iron deficiency    PE (pulmonary thromboembolism) (HCC) 06/2019   Pre-syncope 04/16/2019   Sarcoidosis    Sleep apnea    c pap   Smoker 02/12/2014    Family History  Problem Relation Age of Onset   Hypertension Mother    Cancer Mother    Colon polyps Mother    Healthy Sister  Healthy Sister    Healthy Sister    Healthy Sister    Healthy Daughter    Healthy Son    Healthy Son    Colon cancer Neg Hx    Esophageal cancer Neg Hx    Rectal cancer Neg Hx    Stomach cancer Neg Hx    Past Surgical History:  Procedure Laterality Date   CHOLECYSTECTOMY  N/A 08/25/2016   Procedure: LAPAROSCOPIC CHOLECYSTECTOMY;  Surgeon: Vernetta Berg, MD;  Location: [facility] OR;  Service: General;  Laterality: N/A;   TUBAL LIGATION  02/1993   Social History[1] Social History   Social History Narrative   Live with partners [name]-19 years   3 children.    Daughter passed in 2022. She is raising her grandkids 57 and 93 y.o boy and girl.    Has 2 sons in [place] and 6. (As of 10/2023)      Enjoy: read, play games on tablet, grandkids      Diet: Veggies, does not eat a lot of fried foods, enjoys chicken and fruit   Caffeine: sodas and coffee (with sugar)   Water: Does not drink a lot at all      Wears seat beat   Does not wear sunscreen   Smoke and carbon monoxide detectors   Does not use phone while driving      Immunization History  Administered Date(s) Administered   Influenza Inj Mdck Quad Pf 12/25/2019   Influenza,inj,Quad PF,6+ Mos 12/21/2018, 02/22/2021, 12/17/2021   Influenza,inj,Quad PF,6-35 Mos 01/10/2019   Influenza-Unspecified 12/20/2022   PFIZER(Purple Top)SARS-COV-2 Vaccination 08/14/2019, 09/10/2019, 02/07/2020   PNEUMOCOCCAL CONJUGATE-20 10/26/2023   Pneumococcal Polysaccharide-23 02/12/2014   Tdap 02/12/2014   Zoster Recombinant(Shingrix ) 12/17/2021, 10/18/2022     Objective: Vital Signs: LMP 04/01/2021    Physical Exam   Musculoskeletal Exam: ***  CDAI Exam: CDAI Score: -- Patient Global: --; Provider Global: -- Swollen: --; Tender: -- Joint Exam 04/25/2024   No joint exam has been documented for this visit   There is currently no information documented on the homunculus. Go to the Rheumatology activity and complete the homunculus joint exam.  Investigation: No additional findings.  Imaging: CT ABDOMEN PELVIS W CONTRAST Result Date: 04/01/2024 CLINICAL DATA:  Lower abdominal pain.  Constipation. EXAM: CT ABDOMEN AND PELVIS WITH CONTRAST TECHNIQUE: Multidetector CT imaging of the abdomen and pelvis was  performed using the standard protocol following bolus administration of intravenous contrast. RADIATION DOSE REDUCTION: This exam was performed according to the departmental dose-optimization program which includes automated exposure control, adjustment of the mA and/or kV according to patient size and/or use of iterative reconstruction technique. CONTRAST:  OMNIPAQUE  IOHEXOL  300 MG/ML  SOLN COMPARISON:  10/04/2022 FINDINGS: Lower Chest: No acute findings. Hepatobiliary: No suspicious hepatic masses identified. Prior cholecystectomy. No evidence of biliary obstruction. Pancreas:  No mass or inflammatory changes. Spleen: Within normal limits in size and appearance. Adrenals/Urinary Tract: No suspicious masses identified. No evidence of ureteral calculi or hydronephrosis. Unremarkable unopacified urinary bladder. Stomach/Bowel: No evidence of obstruction, inflammatory process or abnormal fluid collections. Normal appendix visualized. Vascular/Lymphatic: No pathologically enlarged lymph nodes. No acute vascular findings. Reproductive:  No mass or other significant abnormality. Other:  None. Musculoskeletal:  No suspicious bone lesions identified. IMPRESSION: Negative. No acute findings or other significant abnormality. Electronically Signed   By: Norleen DELENA Kil M.D.   On: 04/01/2024 12:09    Recent Labs: Lab Results  Component Value Date   WBC 9.7 02/01/2024   HGB 13.0  02/01/2024   PLT 317 02/01/2024   NA 141 02/01/2024   K 4.2 02/01/2024   CL 107 02/01/2024   CO2 27 02/01/2024   GLUCOSE 84 02/01/2024   BUN 18 02/01/2024   CREATININE 1.16 (H) 02/01/2024   BILITOT 0.5 02/01/2024   ALKPHOS 98 04/25/2023   AST 19 02/01/2024   ALT 17 02/01/2024   PROT 7.2 02/01/2024   ALBUMIN  4.1 04/25/2023   CALCIUM  9.6 02/01/2024   GFRAA 62 09/23/2020   QFTBGOLD Negative 05/01/2015   QFTBGOLDPLUS NEGATIVE 11/08/2019    Speciality Comments: No specialty comments available.  Procedures:  No procedures  performed Allergies: Patient has no known allergies.   Assessment / Plan:     Visit Diagnoses: No diagnosis found.  Orders: No orders of the defined types were placed in this encounter.  No orders of the defined types were placed in this encounter.   Face-to-face time spent with patient was *** minutes. Greater than 50% of time was spent in counseling and coordination of care.  Follow-Up Instructions: No follow-ups on file.   Daved JAYSON Gavel, CMA  Note - This record has been created using Animal nutritionist.  Chart creation errors have been sought, but may not always  have been located. Such creation errors do not reflect on  the standard of medical care.    [1]  Social History Tobacco Use   Smoking status: Former    Current packs/day: 0.00    Average packs/day: 1 pack/day for 20.0 years (20.0 ttl pk-yrs)    Types: Cigarettes    Start date: 03/21/1995    Quit date: 03/21/2015    Years since quitting: 9.0    Passive exposure: Past   Smokeless tobacco: Never  Vaping Use   Vaping status: Never Used  Substance Use Topics   Alcohol use: Not Currently    Comment: Rare   Drug use: Never   "

## 2024-04-23 ENCOUNTER — Encounter: Payer: Self-pay | Admitting: Cardiology

## 2024-04-23 ENCOUNTER — Ambulatory Visit: Attending: Cardiology | Admitting: Cardiology

## 2024-04-23 VITALS — BP 118/84 | HR 75 | Ht 65.0 in | Wt 230.4 lb

## 2024-04-23 DIAGNOSIS — R0789 Other chest pain: Secondary | ICD-10-CM | POA: Insufficient documentation

## 2024-04-23 DIAGNOSIS — Z87898 Personal history of other specified conditions: Secondary | ICD-10-CM | POA: Diagnosis not present

## 2024-04-23 DIAGNOSIS — E782 Mixed hyperlipidemia: Secondary | ICD-10-CM | POA: Diagnosis not present

## 2024-04-23 NOTE — Patient Instructions (Signed)
 Medication Instructions:  Continue all current medications.   Labwork: none  Testing/Procedures: none  Follow-Up: 6 months   Any Other Special Instructions Will Be Listed Below (If Applicable).   If you need a refill on your cardiac medications before your next appointment, please call your pharmacy.

## 2024-04-23 NOTE — Progress Notes (Signed)
 "     Clinical Summary Hailey Ross is a 54 y.o.female seen today for follow up of the following medical problems.    1. Palpitations - ER visit 08/17/19 with tachycardia and dizziness. Reported episode of heart racing to 140s while driving. Prior feeling on uneasiness, followed by sudden palpitations. Checked HR on watch and was 144. Episodes lasted about 5 minutes - K 3.8 Cr 1.16 WBC 12 Hgb 12.8 Plt 444 hstrop 18-->17  Ddimer 0.49    - 2 total episodes of palpitations. - first episode in 07/2019 while sleeping, awoke her from sleep.  - feeling of heart racing. Walked around for a few minutes, did not resolve.  - came to ER 07/15/19, negative workup including CT PE - episode lasted about 15 minutes in total   - notes on her watch HRs up and down in general - can have minor palpitations at times.    - previously was drinking coffee x 1 day, sodas pepsi cans x 2, sweet tea glasses x 2-3, no energy drinks, no EtOH     - 08/2019 30 day monitor, no significant arrhythmias - started on dilt 30mg  bid for symptoms - 10/2020 14 monitor: rare PACs, rare episodes SVT up to 12 beats, rare PVCs - low HRs on daily diltiazem , changed to just prn.   - occasional palpitations, about 2-3 times a week. Lasts just a few seconds. Has not needed her prn diltiazem .  - limiting caffeine, no EtOH.    2. Lightheadedness - can occur with sitting, standing. At time scan be more intense when standing for long periods of time.  - - seen by neurology EEG normal, was to have CTA but troubles scheduling Carotid benign, MRI benign   -orthostatics have been benign in clinic -comes and goes, less peristent before. Often with standing for long periods of time.  - working to stay hydrated    2. Sarcoid/Bronchiectasis - followed by pulmonary  -no significant SOB     3. CKD 2 - followed by Dr Rachele - most recent labs show renal function has been improving.    4. History of PE unprovoked - bilateral PEs in  06/2019, has been on xarelto  - repeat CT PE 07/2019 PEs had resolved.  - 06/2019 echo LVEF 60-65^, mild RV dysfunction, PASP 52 at time of PE   - denies any bleeding on xarelto .    5. Pulmonary HTN - noted during echo during PE - repeat study shows PASP had decreased     6. OSA on cpap - mixed compliance     7. Hyperlipidmiea TC 230 HDL 58 TG 93 LDL 152 - upcomiing labs with pcp - compliant with statin  10/2023 TC 240 TG 63 HDL 61 LDL 159. She had stopped her statin for a period at this time, pcp restarted.   8. Chest pain -11/2022 coronary CTA: coronary calcium  score is 0, no CAD - 11/2022 echo: LVEF 60-65%, no WMAs, normal diastolic function, normal RV. Ascending aorta 43 mm.       Past Medical History:  Diagnosis Date   Acute cholecystitis 08/24/2016   Acute kidney injury superimposed on chronic kidney disease 06/20/2019   Acute respiratory failure with hypoxia (HCC) 06/19/2019   Arthritis    right knee   Bronchiectasis (HCC)    CKD (chronic kidney disease)    Iron deficiency    PE (pulmonary thromboembolism) (HCC) 06/2019   Pre-syncope 04/16/2019   Sarcoidosis    Sleep apnea    c  pap   Smoker 02/12/2014     Allergies[1]   Current Outpatient Medications  Medication Sig Dispense Refill   acetaminophen  (TYLENOL ) 500 MG tablet Take 500 mg by mouth every 4 (four) hours as needed for mild pain, moderate pain or headache.      albuterol  (VENTOLIN  HFA) 108 (90 Base) MCG/ACT inhaler Inhale 2 puffs into the lungs every 6 (six) hours as needed for wheezing or shortness of breath. 8 g 0   budesonide -formoterol  (SYMBICORT ) 80-4.5 MCG/ACT inhaler Take 2 puffs first thing in am and then another 2 puffs about 12 hours later. 1 each 12   budesonide -formoterol  (SYMBICORT ) 80-4.5 MCG/ACT inhaler Inhale 2 puffs into the lungs first thing in the morning and then 2 puffs about 12 hours later. 10.2 g 12   budesonide -glycopyrrolate-formoterol  (BREZTRI  AEROSPHERE) 160-9-4.8 MCG/ACT  AERO inhaler Inhale 2 puffs into the lungs 2 (two) times daily.     clindamycin  (CLEOCIN  T) 1 % external solution Apply to affected area up to 2 times daily as needed. 60 mL 99   diltiazem  (CARDIZEM ) 30 MG tablet Take 1 tablet (30 mg total) by mouth daily as needed (For palpitations). May take an additional 30 mg tablet daily as needed for palpitations 135 tablet 1   doxycycline  (VIBRAMYCIN ) 100 MG capsule Take 1 capsule (100 mg total) by mouth 2 (two) times daily as needed with food and water (sun warning). 60 capsule 2   folic acid  (FOLVITE ) 1 MG tablet Take 1 tablet (1 mg total) by mouth daily. 90 tablet 0   LORazepam (ATIVAN) 0.5 MG tablet Take 0.5 mg by mouth as needed for anxiety.     methotrexate  (RHEUMATREX) 2.5 MG tablet Take 4 tablets (10 mg total) by mouth once a week. Caution:Chemotherapy. Protect from light. 48 tablet 0   pantoprazole  (PROTONIX ) 40 MG tablet Take 1 tablet (40 mg total) by mouth daily. 90 tablet 3   rivaroxaban  (XARELTO ) 20 MG TABS tablet Take 1 tablet (20 mg total) by mouth daily with supper. (Patient not taking: Reported on 02/29/2024) 90 tablet 3   rivaroxaban  (XARELTO ) 20 MG TABS tablet Take 1 tablet (20 mg total) by mouth daily with supper. 90 tablet 3   rosuvastatin  (CRESTOR ) 5 MG tablet Take 1 tablet (5 mg total) by mouth at bedtime. (Patient not taking: Reported on 02/29/2024) 90 tablet 1   rosuvastatin  (CRESTOR ) 5 MG tablet Take 1 tablet (5 mg total) by mouth at bedtime. 90 tablet 1   Vitamin D , Ergocalciferol , (DRISDOL ) 1.25 MG (50000 UNIT) CAPS capsule Take 1 capsule (50,000 Units total) by mouth every 7 (seven) days. 20 capsule 1   No current facility-administered medications for this visit.     Past Surgical History:  Procedure Laterality Date   CHOLECYSTECTOMY N/A 08/25/2016   Procedure: LAPAROSCOPIC CHOLECYSTECTOMY;  Surgeon: Vernetta Berg, MD;  Location: [facility];  Service: General;  Laterality: N/A;   TUBAL LIGATION  02/1993      Allergies[2]    Family History  Problem Relation Age of Onset   Hypertension Mother    Cancer Mother    Colon polyps Mother    Healthy Sister    Healthy Sister    Healthy Sister    Healthy Sister    Healthy Daughter    Healthy Son    Healthy Son    Colon cancer Neg Hx    Esophageal cancer Neg Hx    Rectal cancer Neg Hx    Stomach cancer Neg Hx  Social History Hailey Ross reports that she quit smoking about 9 years ago. Her smoking use included cigarettes. She started smoking about 29 years ago. She has a 20 pack-year smoking history. She has been exposed to tobacco smoke. She has never used smokeless tobacco. Hailey Ross reports that she does not currently use alcohol.    Physical Examination Today's Vitals   04/23/24 1339  BP: 118/84  Pulse: 75  SpO2: 92%  Weight: 230 lb 6.4 oz (104.5 kg)  Height: 5' 5 (1.651 m)   Body mass index is 38.34 kg/m.  Gen: resting comfortably, no acute distress HEENT: no scleral icterus, pupils equal round and reactive, no palptable cervical adenopathy,  CV: RRR, no mrg, no jvd Resp: Clear to auscultation bilaterally GI: abdomen is soft, non-tender, non-distended, normal bowel sounds, no hepatosplenomegaly MSK: extremities are warm, no edema.  Skin: warm, no rash Neuro:  no focal deficits Psych: appropriate affect   Diagnostic Studies  08/2019 event monitor 30 day event monitor Min HR 47, Max HR 135, Avg HR 70. Min HR occurred in early AM hours presumably while sleeping Symptoms correlate with sinus rhythm No significant arrhythmias   09/2019 echo IMPRESSIONS     1. Left ventricular ejection fraction, by estimation, is 55 to 60%. The  left ventricle has normal function. The left ventricle has no regional  wall motion abnormalities. Left ventricular diastolic parameters were  normal.   2. Right ventricular systolic function is mildly reduced. The right  ventricular size is moderately enlarged. There is mildly  elevated  pulmonary artery systolic pressure. The estimated right ventricular  systolic pressure is 35.3 mmHg.   3. Left atrial size was mildly dilated.   4. Right atrial size was mildly dilated.   5. The mitral valve is normal in structure. No evidence of mitral valve  regurgitation. No evidence of mitral stenosis.   6. Tricuspid valve regurgitation is mild to moderate.   7. The aortic valve is normal in structure. Aortic valve regurgitation is  not visualized. No aortic stenosis is present.   8. The inferior vena cava is normal in size with greater than 50%  respiratory variability, suggesting right atrial pressure of 3 mmHg.    10/2020 monitor 14 day monitor Rare supraventricular ectopy in the form of isolated PACs, coupletes, triplets. Rare episodes of SVT up to 12 beats. Rare ventricular ectopy in the form of isolated PVCs, couplets Reported symptoms correlate with sinus rhythm     03/2021 nuclear stress Lexiscan  stress is electrically negative for ischemia   Myoview  scan with probable normal perfusion and mild soft tissue attenuaton (breast)   No significant ischemia or scar   LVEF is 67% with normal wall motion.   Low risk study     Assessment and Plan  1. Palpitations -prior monitors with benign ectopy, short runs SVT - low HRs on daily diltiazem , has just as prn now. - overall doing well continue current meds.  - EKG today shows NSR     2.HLD - above goal however had been off statin at time of last panel, back on now. Will have f/u labs with pcp  3. Chest pain - benign coronary CTA in 2024, mild infrequent symptoms - continue to monitor.       Dorn PHEBE Ross, M.D.     [1] No Known Allergies [2] No Known Allergies  "

## 2024-04-25 ENCOUNTER — Ambulatory Visit: Admitting: Rheumatology

## 2024-04-25 DIAGNOSIS — E559 Vitamin D deficiency, unspecified: Secondary | ICD-10-CM

## 2024-04-25 DIAGNOSIS — R7689 Other specified abnormal immunological findings in serum: Secondary | ICD-10-CM

## 2024-04-25 DIAGNOSIS — M25462 Effusion, left knee: Secondary | ICD-10-CM

## 2024-04-25 DIAGNOSIS — G8929 Other chronic pain: Secondary | ICD-10-CM

## 2024-04-25 DIAGNOSIS — I10 Essential (primary) hypertension: Secondary | ICD-10-CM

## 2024-04-25 DIAGNOSIS — Z79899 Other long term (current) drug therapy: Secondary | ICD-10-CM

## 2024-04-25 DIAGNOSIS — M7061 Trochanteric bursitis, right hip: Secondary | ICD-10-CM

## 2024-04-25 DIAGNOSIS — M19042 Primary osteoarthritis, left hand: Secondary | ICD-10-CM

## 2024-04-25 DIAGNOSIS — D869 Sarcoidosis, unspecified: Secondary | ICD-10-CM

## 2024-04-25 DIAGNOSIS — G4733 Obstructive sleep apnea (adult) (pediatric): Secondary | ICD-10-CM

## 2024-04-25 DIAGNOSIS — N2581 Secondary hyperparathyroidism of renal origin: Secondary | ICD-10-CM

## 2024-04-25 DIAGNOSIS — G4719 Other hypersomnia: Secondary | ICD-10-CM

## 2024-04-25 DIAGNOSIS — E782 Mixed hyperlipidemia: Secondary | ICD-10-CM

## 2024-04-25 DIAGNOSIS — I2699 Other pulmonary embolism without acute cor pulmonale: Secondary | ICD-10-CM

## 2024-04-25 DIAGNOSIS — I272 Pulmonary hypertension, unspecified: Secondary | ICD-10-CM

## 2024-04-25 DIAGNOSIS — N1831 Chronic kidney disease, stage 3a: Secondary | ICD-10-CM

## 2024-04-25 DIAGNOSIS — J479 Bronchiectasis, uncomplicated: Secondary | ICD-10-CM

## 2024-04-29 NOTE — Progress Notes (Unsigned)
 "  Office Visit Note  Patient: Hailey Ross             Date of Birth: Sep 02, 1970           MRN: [id number]             PCP: Colette Torrence GRADE, MD Referring: Colette Torrence GRADE, MD Visit Date: 05/09/2024 Occupation: Data Unavailable  Subjective:  Bilateral knee pain   History of Present Illness: Hailey Ross is a 54 y.o. female with history of sarcoidosis and osteoarthritis. Patient is currently taking Methotrexate  4 tablets p.o. weekly along with folic acid  1 mg p.o daily.    CBC and CMP updated on 02/01/24.  Orders for CBC and CMP released today.   Discussed the importance of holding methotrexate  if she develops signs or symptoms of an infection and to resume once the infection has completely cleared.    Activities of Daily Living:  Patient reports morning stiffness for *** {minute/hour:19697}.   Patient {ACTIONS;DENIES/REPORTS:21021675::Denies} nocturnal pain.  Difficulty dressing/grooming: {ACTIONS;DENIES/REPORTS:21021675::Denies} Difficulty climbing stairs: {ACTIONS;DENIES/REPORTS:21021675::Denies} Difficulty getting out of chair: {ACTIONS;DENIES/REPORTS:21021675::Denies} Difficulty using hands for taps, buttons, cutlery, and/or writing: {ACTIONS;DENIES/REPORTS:21021675::Denies}  No Rheumatology ROS completed.   PMFS History:  Patient Active Problem List   Diagnosis Date Noted   Asthmatic bronchitis , chronic (HCC) 06/12/2023   Abscess 05/26/2023   Prediabetes 02/20/2023   Multiple cysts of breast 02/17/2023   Overactive bladder 01/18/2023   Chronic anticoagulation 12/06/2022   Epistaxis 12/06/2022   Acute frontal sinusitis 12/06/2022   Acute respiratory failure with hypoxia (HCC) 12/05/2022   Gastroparesis 11/29/2022   Colitis 10/18/2022   Other constipation 10/04/2022   Upper abdominal pain 04/29/2022   Early satiety 04/29/2022   Acute bronchitis 03/21/2022   Immunization due 12/17/2021   Need for immunization against influenza 12/17/2021    Fatigue 12/17/2021   Encounter for annual general medical examination with abnormal findings in adult 08/30/2021   CAP (community acquired pneumonia) 06/16/2021   Acute midline low back pain without sciatica 04/22/2021   Left lower quadrant abdominal pain 04/20/2021   Atypical chest pain 01/26/2021   Synovitis of left knee 01/19/2021   GERD (gastroesophageal reflux disease) 12/29/2020   Abdominal pain, epigastric 12/28/2020   Primary osteoarthritis of left knee 10/27/2020   Grief at loss of child 06/09/2020   Insomnia 06/09/2020   Right ankle swelling 02/05/2020   Encounter for examination following treatment at hospital 01/28/2020   Palpitations 01/28/2020   Menorrhagia with regular cycle 10/15/2019   OSA (obstructive sleep apnea) 10/08/2019   Right knee pain 08/29/2019   Positive ANA (antinuclear antibody) 08/29/2019   Essential hypertension 07/04/2019   Pain and swelling of right knee 07/04/2019   Hyperlipidemia 07/04/2019   Stage 2 chronic kidney disease 07/04/2019   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   Encounter for support and coordination of transition of care 07/04/2019   Daytime hypersomnolence 07/02/2019   Pulmonary hypertension (HCC) 07/02/2019   CKD (chronic kidney disease)    Bilateral pulmonary embolism (HCC) 06/18/2019   Lightheadedness 05/10/2019   Vitamin D  deficiency 05/10/2019   Obesity (BMI 30-39.9) 04/16/2019   Sarcoidosis 05/04/2015   Bronchiectasis (HCC) 05/04/2015    Past Medical History:  Diagnosis Date   Acute cholecystitis 08/24/2016   Acute kidney injury superimposed on chronic kidney disease 06/20/2019   Acute respiratory failure with hypoxia (HCC) 06/19/2019   Arthritis    right knee   Bronchiectasis (HCC)    CKD (chronic kidney disease)    Iron deficiency  PE (pulmonary thromboembolism) (HCC) 06/2019   Pre-syncope 04/16/2019   Sarcoidosis    Sleep apnea    c pap   Smoker 02/12/2014    Family History  Problem Relation Age of Onset    Hypertension Mother    Cancer Mother    Colon polyps Mother    Healthy Sister    Healthy Sister    Healthy Sister    Healthy Sister    Healthy Daughter    Healthy Son    Healthy Son    Colon cancer Neg Hx    Esophageal cancer Neg Hx    Rectal cancer Neg Hx    Stomach cancer Neg Hx    Past Surgical History:  Procedure Laterality Date   CHOLECYSTECTOMY N/A 08/25/2016   Procedure: LAPAROSCOPIC CHOLECYSTECTOMY;  Surgeon: Vernetta Berg, MD;  Location: [facility] OR;  Service: General;  Laterality: N/A;   TUBAL LIGATION  02/1993   Social History[1] Social History   Social History Narrative   Live with partners [name]-19 years   3 children.    Daughter passed in 2022. She is raising her grandkids 60 and 69 y.o boy and girl.    Has 2 sons in [place] and 64. (As of 10/2023)      Enjoy: read, play games on tablet, grandkids      Diet: Veggies, does not eat a lot of fried foods, enjoys chicken and fruit   Caffeine: sodas and coffee (with sugar)   Water: Does not drink a lot at all      Wears seat beat   Does not wear sunscreen   Smoke and carbon monoxide detectors   Does not use phone while driving      Immunization History  Administered Date(s) Administered   Influenza Inj Mdck Quad Pf 12/25/2019   Influenza,inj,Quad PF,6+ Mos 12/21/2018, 02/22/2021, 12/17/2021   Influenza,inj,Quad PF,6-35 Mos 01/10/2019   Influenza-Unspecified 12/20/2022   PFIZER(Purple Top)SARS-COV-2 Vaccination 08/14/2019, 09/10/2019, 02/07/2020   PNEUMOCOCCAL CONJUGATE-20 10/26/2023   Pneumococcal Polysaccharide-23 02/12/2014   Tdap 02/12/2014   Zoster Recombinant(Shingrix ) 12/17/2021, 10/18/2022     Objective: Vital Signs: LMP 04/01/2021    Physical Exam Vitals and nursing note reviewed.  Constitutional:      Appearance: She is well-developed.  HENT:     Head: Normocephalic and atraumatic.  Eyes:     Conjunctiva/sclera: Conjunctivae normal.  Cardiovascular:     Rate and Rhythm: Normal rate  and regular rhythm.     Heart sounds: Normal heart sounds.  Pulmonary:     Effort: Pulmonary effort is normal.     Breath sounds: Normal breath sounds.  Abdominal:     General: Bowel sounds are normal.     Palpations: Abdomen is soft.  Musculoskeletal:     Cervical back: Normal range of motion.  Lymphadenopathy:     Cervical: No cervical adenopathy.  Skin:    General: Skin is warm and dry.     Capillary Refill: Capillary refill takes less than 2 seconds.  Neurological:     Mental Status: She is alert and oriented to person, place, and time.  Psychiatric:        Behavior: Behavior normal.      Musculoskeletal Exam: ***  CDAI Exam: CDAI Score: -- Patient Global: --; Provider Global: -- Swollen: --; Tender: -- Joint Exam 05/09/2024   No joint exam has been documented for this visit   There is currently no information documented on the homunculus. Go to the Rheumatology activity and complete the  homunculus joint exam.  Investigation: No additional findings.  Imaging: No results found.  Recent Labs: Lab Results  Component Value Date   WBC 9.7 02/01/2024   HGB 13.0 02/01/2024   PLT 317 02/01/2024   NA 141 02/01/2024   K 4.2 02/01/2024   CL 107 02/01/2024   CO2 27 02/01/2024   GLUCOSE 84 02/01/2024   BUN 18 02/01/2024   CREATININE 1.16 (H) 02/01/2024   BILITOT 0.5 02/01/2024   ALKPHOS 98 04/25/2023   AST 19 02/01/2024   ALT 17 02/01/2024   PROT 7.2 02/01/2024   ALBUMIN  4.1 04/25/2023   CALCIUM  9.6 02/01/2024   GFRAA 62 09/23/2020   QFTBGOLD Negative 05/01/2015   QFTBGOLDPLUS NEGATIVE 11/08/2019    Speciality Comments: No specialty comments available.  Procedures:  No procedures performed Allergies: Patient has no known allergies.   Assessment / Plan:     Visit Diagnoses: No diagnosis found.  Orders: No orders of the defined types were placed in this encounter.  No orders of the defined types were placed in this encounter.   Face-to-face time  spent with patient was *** minutes. Greater than 50% of time was spent in counseling and coordination of care.  Follow-Up Instructions: No follow-ups on file.   Shelba SHAUNNA Potters, RT  Note - This record has been created using [employer].  Chart creation errors have been sought, but may not always  have been located. Such creation errors do not reflect on  the standard of medical care.     [1]  Social History Tobacco Use   Smoking status: Former    Current packs/day: 0.00    Average packs/day: 1 pack/day for 20.0 years (20.0 ttl pk-yrs)    Types: Cigarettes    Start date: 03/21/1995    Quit date: 03/21/2015    Years since quitting: 9.1    Passive exposure: Past   Smokeless tobacco: Never  Vaping Use   Vaping status: Never Used  Substance Use Topics   Alcohol use: Not Currently    Comment: Rare   Drug use: Never   "

## 2024-05-01 ENCOUNTER — Ambulatory Visit: Admitting: Gastroenterology

## 2024-05-09 ENCOUNTER — Ambulatory Visit: Admitting: Physician Assistant

## 2024-05-09 DIAGNOSIS — D869 Sarcoidosis, unspecified: Secondary | ICD-10-CM

## 2024-05-09 DIAGNOSIS — Z79899 Other long term (current) drug therapy: Secondary | ICD-10-CM

## 2024-05-09 DIAGNOSIS — M19041 Primary osteoarthritis, right hand: Secondary | ICD-10-CM

## 2024-05-09 DIAGNOSIS — I2699 Other pulmonary embolism without acute cor pulmonale: Secondary | ICD-10-CM

## 2024-05-09 DIAGNOSIS — N2581 Secondary hyperparathyroidism of renal origin: Secondary | ICD-10-CM

## 2024-05-09 DIAGNOSIS — G4719 Other hypersomnia: Secondary | ICD-10-CM

## 2024-05-09 DIAGNOSIS — E559 Vitamin D deficiency, unspecified: Secondary | ICD-10-CM

## 2024-05-09 DIAGNOSIS — M25462 Effusion, left knee: Secondary | ICD-10-CM

## 2024-05-09 DIAGNOSIS — G8929 Other chronic pain: Secondary | ICD-10-CM

## 2024-05-09 DIAGNOSIS — R7689 Other specified abnormal immunological findings in serum: Secondary | ICD-10-CM

## 2024-05-09 DIAGNOSIS — J479 Bronchiectasis, uncomplicated: Secondary | ICD-10-CM

## 2024-05-09 DIAGNOSIS — I10 Essential (primary) hypertension: Secondary | ICD-10-CM

## 2024-05-09 DIAGNOSIS — M7061 Trochanteric bursitis, right hip: Secondary | ICD-10-CM

## 2024-05-09 DIAGNOSIS — N1831 Chronic kidney disease, stage 3a: Secondary | ICD-10-CM

## 2024-05-09 DIAGNOSIS — G4733 Obstructive sleep apnea (adult) (pediatric): Secondary | ICD-10-CM

## 2024-05-09 DIAGNOSIS — E782 Mixed hyperlipidemia: Secondary | ICD-10-CM

## 2024-05-09 DIAGNOSIS — I272 Pulmonary hypertension, unspecified: Secondary | ICD-10-CM

## 2024-05-11 ENCOUNTER — Other Ambulatory Visit: Payer: Self-pay

## 2024-05-11 ENCOUNTER — Encounter (HOSPITAL_BASED_OUTPATIENT_CLINIC_OR_DEPARTMENT_OTHER): Payer: Self-pay | Admitting: Emergency Medicine

## 2024-05-11 ENCOUNTER — Emergency Department (HOSPITAL_BASED_OUTPATIENT_CLINIC_OR_DEPARTMENT_OTHER)
Admission: EM | Admit: 2024-05-11 | Discharge: 2024-05-11 | Disposition: A | Discharge status: Home / Self Care | Attending: Emergency Medicine | Admitting: Emergency Medicine

## 2024-05-11 DIAGNOSIS — R040 Epistaxis: Secondary | ICD-10-CM | POA: Insufficient documentation

## 2024-05-11 DIAGNOSIS — Z7901 Long term (current) use of anticoagulants: Secondary | ICD-10-CM | POA: Diagnosis not present

## 2024-05-11 LAB — CBC WITH DIFFERENTIAL/PLATELET
Abs Immature Granulocytes: 0.02 10*3/uL (ref 0.00–0.07)
Basophils Absolute: 0 10*3/uL (ref 0.0–0.1)
Basophils Relative: 0 %
Eosinophils Absolute: 0.1 10*3/uL (ref 0.0–0.5)
Eosinophils Relative: 1 %
HCT: 41.2 % (ref 36.0–46.0)
Hemoglobin: 13 g/dL (ref 12.0–15.0)
Immature Granulocytes: 0 %
Lymphocytes Relative: 20 %
Lymphs Abs: 2.2 10*3/uL (ref 0.7–4.0)
MCH: 26.7 pg (ref 26.0–34.0)
MCHC: 31.6 g/dL (ref 30.0–36.0)
MCV: 84.6 fL (ref 80.0–100.0)
Monocytes Absolute: 0.6 10*3/uL (ref 0.1–1.0)
Monocytes Relative: 5 %
Neutro Abs: 7.9 10*3/uL — ABNORMAL HIGH (ref 1.7–7.7)
Neutrophils Relative %: 74 %
Platelets: 280 10*3/uL (ref 150–400)
RBC: 4.87 MIL/uL (ref 3.87–5.11)
RDW: 14 % (ref 11.5–15.5)
WBC: 10.9 10*3/uL — ABNORMAL HIGH (ref 4.0–10.5)
nRBC: 0 % (ref 0.0–0.2)

## 2024-05-11 LAB — PROTIME-INR
INR: 1.8 — ABNORMAL HIGH (ref 0.8–1.2)
Prothrombin Time: 22.2 s — ABNORMAL HIGH (ref 11.4–15.2)

## 2024-05-11 MED ORDER — AMOXICILLIN-POT CLAVULANATE 875-125 MG PO TABS: 1.0000 | ORAL_TABLET | Freq: Two times a day (BID) | ORAL | 0 refills | Status: DC

## 2024-05-11 MED ORDER — AMOXICILLIN-POT CLAVULANATE 875-125 MG PO TABS
1.0000 | ORAL_TABLET | Freq: Once | ORAL | Status: AC
  Administered 2024-05-11: 1 via ORAL
  Filled 2024-05-11: qty 1

## 2024-05-11 MED ORDER — OXYMETAZOLINE HCL 0.05 % NA SOLN
1.0000 | Freq: Once | NASAL | Status: AC
  Administered 2024-05-11: 1 via NASAL
  Filled 2024-05-11: qty 30

## 2024-05-11 NOTE — ED Provider Notes (Signed)
 Epistaxis Management  Date/Time: 05/11/2024 8:35 PM  Performed by: Armenta Canning, MD Authorized by: Armenta Canning, MD   Consent:    Consent obtained:  Verbal   Consent given by:  Patient Procedure details:    Treatment site:  R anterior   Treatment method:  Nasal balloon   Treatment complexity:  Extensive   Treatment episode: initial   Post-procedure details:    Assessment:  Bleeding decreased   Procedure completion:  Tolerated well, no immediate complications Comments:     Clot was evacuated by treatable with bayonet forceps and subsequently patient blowing.  Large clot was obstructing the entire nasal passage.  Once removed there was some persistent trickle of red active bleeding.  Nasal Neo-Synephrine instilled.  Nare confirmed to be clear.  I first tried passage of a 7.5 rapid Rhino balloon.  This met obstruction and was not amenable to full passage.  Patient reports that she has been told frequently that she looks like her nose is obstructed or has some type of septal deviation.  I changed to a 5.5 balloon pack and was able to pass this completely.  Balloon was instilled incrementally up to 5 mL air.  Bleeding was stopped with only occasional slight trickle anteriorly.  No posterior bleeding.  Patient was observed for an additional 20 to 30 minutes.  She was reassessed by PA-C and found to have facial pain.  Balloon was decreased to 4.5 mL which the patient found comfortable.  No rebleeding.  Discharged with bleeding controlled.     Armenta Canning, MD 05/11/24 2038

## 2024-05-11 NOTE — ED Provider Notes (Signed)
 " [place] EMERGENCY DEPARTMENT AT [facility] Provider Note   CSN: [id number] Arrival date & time: 05/11/24  1455     Patient presents with: Epistaxis   Hailey Ross is a 54 y.o. female.    Epistaxis 54 year old female presenting with epistasis.  Patient reports this has been going on and off for the past 2 to 3 days.  It normally last about 10 minutes however today she has had some nosebleeding for about an hour and a half.  She has not tried using any nasal sprays to help.  She reports she has had some congestion and also a dry nose.  Patient has been prescribed a medication by her doctor for dry nose.  She has never had nosebleeds like this.  She is on a blood thinner.  She does not complain of any dizziness chest pain or shortness of breath.     Prior to Admission medications  Medication Sig Start Date End Date Taking? Authorizing Provider  amoxicillin -clavulanate (AUGMENTIN ) 875-125 MG tablet Take 1 tablet by mouth every 12 (twelve) hours. 05/11/24  Yes Rosaline Almarie MATSU, PA-C  acetaminophen  (TYLENOL ) 500 MG tablet Take 500 mg by mouth every 4 (four) hours as needed for mild pain, moderate pain or headache.     [provider]  albuterol  (VENTOLIN  HFA) 108 (90 Base) MCG/ACT inhaler Inhale 2 puffs into the lungs every 6 (six) hours as needed for wheezing or shortness of breath. 08/11/21   Leath-Warren, Etta PARAS, NP  diltiazem  (CARDIZEM ) 30 MG tablet Take 1 tablet (30 mg total) by mouth daily as needed (For palpitations). May take an additional 30 mg tablet daily as needed for palpitations 10/05/22   Miriam Almarie, NP  doxycycline  (VIBRAMYCIN ) 100 MG capsule Take 1 capsule (100 mg total) by mouth 2 (two) times daily as needed with food and water (sun warning). 07/05/23     LORazepam (ATIVAN) 0.5 MG tablet Take 0.5 mg by mouth as needed for anxiety. 01/21/21   [provider]  methotrexate  (RHEUMATREX) 2.5 MG tablet Take 4 tablets (10 mg total) by mouth  once a week. Caution:Chemotherapy. Protect from light. 02/05/24   Cheryl Waddell HERO, PA-C  rivaroxaban  (XARELTO ) 20 MG TABS tablet Take 1 tablet (20 mg total) by mouth daily with supper. 01/30/24   Parrett, Madelin RAMAN, NP  rosuvastatin  (CRESTOR ) 5 MG tablet Take 1 tablet (5 mg total) by mouth at bedtime. 10/27/23   Colette Torrence GRADE, MD    Allergies: Patient has no known allergies.    Review of Systems  HENT:  Positive for nosebleeds.   All other systems reviewed and are negative.   Updated Vital Signs BP (!) 152/95 (BP Location: Right Arm)   Pulse 64   Temp 98.2 F (36.8 C) (Oral)   Resp 16   Ht 5' 5 (1.651 m)   Wt 104.5 kg   LMP 04/01/2021   SpO2 100%   BMI 38.34 kg/m   Physical Exam Vitals and nursing note reviewed.  Constitutional:      Appearance: Normal appearance.  HENT:     Nose: Congestion present. No nasal deformity or signs of injury.     Right Nostril: Epistaxis present. No foreign body, septal hematoma or occlusion.     Comments: Active bleeding noted from the right nare.  No blood noted in the left nare.  No foreign bodies noted. Neurological:     Mental Status: She is alert.     (all labs ordered are listed, but  only abnormal results are displayed) Labs Reviewed  CBC WITH DIFFERENTIAL/PLATELET - Abnormal; Notable for the following components:      Result Value   WBC 10.9 (*)    Neutro Abs 7.9 (*)    All other components within normal limits  PROTIME-INR - Abnormal; Notable for the following components:   Prothrombin Time 22.2 (*)    INR 1.8 (*)    All other components within normal limits    EKG: None  Radiology: No results found.   Procedures   Medications Ordered in the ED  oxymetazoline  (AFRIN) 0.05 % nasal spray 1 spray (1 spray Each Nare Given 05/11/24 1524)  amoxicillin -clavulanate (AUGMENTIN ) 875-125 MG per tablet 1 tablet (1 tablet Oral Given 05/11/24 1839)                                    Medical Decision Making Amount and/or  Complexity of Data Reviewed Labs: ordered.  Risk OTC drugs. Prescription drug management.   Impression: 54 year old female presenting with epistasis.  Differential diagnoses include high blood pressure, epistasis, dry naris, trauma  Additional History: Patient was able to provide history.  Also reviewed other outpatient notes  Labs: CBC showed no acute changes.  Pro time INR showed prothrombin time of 22.2 and INR of 1.8.  Imaging: None  ED Course/Meds: 54 year old female presenting with nosebleed.  Patient was well-appearing and in no acute distress.  It was noted on initial assessment patient had a nosebleed on the right nare.  She has had a few of these over the past 2 or 3 days.  Those only lasted about 10 minutes however this 1 has lasted for about an hour and a half.  Patient does not have a history of high blood pressure.  She denies any headaches.  Patient was given 1 squirt of Afrin.  On reassessment her nosebleed has stopped.  When I went into discharge her her nosebleed began again.  She was given another squirt of Afrin.  Family came out of the room and reported she was pouring out blood.  On reassessment it was noted that she was still bleeding from her right nare and a clot was not noticed.  With the assistance of Dr. Armenta the clot was removed and a balloon was placed in the nose.  The balloon was then inflated to 5 cc.  Patient reports that it was too much pressure and was getting really bad headache.  I decreased the inflation to 4.5 cc and she reports that is much better. On reassessment patient was no longer having any bleeding.  There was no bleeding noted in the back of the throat either.  Patient reports that the pressure is better however still there.  Patient was informed that she should follow-up with a ENT in the next 2 to 3 days.  If she is unable to get in with the ENT she should follow-up in the ER.  Also prescribed a antibiotic prophylactically due to the packing.   Patient denied any lightheadedness or dizziness.  Her CBC showed no signs of anemia.  Patient agreed to the plan stated above.  Patient was given strict return precautions.  Patient agreed to these return precautions.  All questions were answered.  Patient remained stable in the ER and at discharge     Final diagnoses:  Epistaxis    ED Discharge Orders  Ordered    amoxicillin -clavulanate (AUGMENTIN ) 875-125 MG tablet  Every 12 hours        05/11/24 1831               Rosaline Almarie KANDICE DEVONNA 05/11/24 1859    Armenta Canning, MD 05/11/24 2034  "

## 2024-05-11 NOTE — Discharge Instructions (Signed)
 You were seen today in the ER for nosebleeds.  We have placed a balloon in your nose to help with the bleeding.  This is to stay in place until you see the ENT.  I have provided a referral for you to call on Monday.  The balloon needs to be removed in 2 to 3 days.  If you are unable to get a ENT appointment on Monday Tuesday or Wednesday I recommend that you return to the ER for us  to remove it.  I also have prescribed you a antibiotic to begin taking when she pick it up.  You received your first dose here in the ER.  If you have any worsening symptoms such as increasing bleeding.  Bleeding down the back of your throat.  Chest pain, shortness of breath, dizziness please return to the ER.  I also recommend that you follow-up with your primary care if this continues.

## 2024-05-11 NOTE — ED Notes (Signed)
 Just rechecked by our P.A. Elizabeth. Pt. Continues to ooze a bit of blood from right nare. Right nare re-sprayed with Afrin at this time per V.O. P.A. Elizabeth.

## 2024-05-15 ENCOUNTER — Inpatient Hospital Stay: Admitting: Family Medicine

## 2024-05-16 NOTE — Progress Notes (Signed)
 Otolaryngology Office Note  HPI:    Hailey Ross is a 54 y.o. female who presents as a consult  Patient.   Referring Provider: No ref. provider found   Chief complaint: Epistaxis   HPI: she is on long-term anticoagulation therapy for Pulmonary embolus.  She had a bad right side epistaxis last weekend and had packing placed in the emergency department.  She does not normally use any nasal sprays.  PMH/Meds/All/SocHx/FamHx/ROS:   Medical History[1]  Surgical History[2]  No family history of bleeding disorders, wound healing problems or difficulty with anesthesia.   Social Connections: Unknown (08/24/2021)   Received from [facility]   Social Network    Social Network: Not on file    Current Medications[3]  A complete ROS was performed with pertinent positives/negatives noted in the HPI. The remainder of the ROS are negative.    Physical Exam:    BP (!) 136/91   Pulse 82   Temp 97.8 F (36.6 C) (Temporal)   Ht 1.651 m (5' 5)   Wt 102 kg (225 lb 3.2 oz)   BMI 37.48 kg/m    General:  Healthy and alert, in no distress, breathing easily. Normal affect. In a pleasant mood. Head: Normocephalic, atraumatic. No masses, or scars. Eyes: Pupils are equal, and reactive to light. Vision is grossly intact. No spontaneous or gaze nystagmus. Ears: Ear canals are clear. Tympanic membranes are intact, with normal landmarks and the middle ears are clear and healthy. Hearing: Grossly normal. Nose: Right side inflatable packing removed.  No active bleeding.  Nasal cavities are clear with healthy mucosa, no polyps or exudate. Airways are patent. Face: No masses or scars, facial nerve function is symmetric. Oral Cavity: No mucosal abnormalities are noted. Tongue with normal mobility. Dentition appears healthy. Oropharynx: Tonsils are symmetric. There are no mucosal masses identified. Tongue base appears normal and healthy. Larynx/Hypopharynx: deferred Chest: Deferred Neck: No  palpable masses, no cervical adenopathy, no thyroid  nodules or enlargement. Neuro: Cranial nerves II-XII with normal function. Balance: Normal gate. Other findings: none.   Independent Review of Additional Tests or Records:  none  Procedures:  none   Impression & Plans:  Epistaxis, no further bleeding.  She is on long-term anticoagulation.  Recommend frequent use of nasal saline spray to moisturize the nose.  Recommend Afrin on a cottonball to stop active bleeding.  Follow-up if she has additional problems.We discussed the importance of using nasal saline spray, multiple times daily to replace moisture to the nasal mucosa. Instructions were given on the proper way to administer the saline. A full handout was provided with additional information. All questions were answered.    Medical Decision Making: #/Compl Problems  2     Data Rev  1  Management 2  (1-Straightforward, 2-Low, 3- Moderate, 4-High)  Ida VEAR Loader, MD        [1] History reviewed. No pertinent past medical history. [2] History reviewed. No pertinent surgical history. [3]  Current Outpatient Medications:    acetaminophen  (TYLENOL ) 500 mg tablet, Take 500 mg by mouth every 4 (four) hours as needed., Disp: , Rfl:    albuterol  HFA (PROVENTIL  HFA;VENTOLIN  HFA;PROAIR  HFA) 90 mcg/actuation inhaler, Inhale 2 puffs every 6 (six) hours as needed for wheezing or shortness of breath., Disp: , Rfl:    budesonide -formoterol  (SYMBICORT ;BREYNA ) 80-4.5 mcg/actuation inhaler, Inhale 2 puffs in the morning and 2 puffs before bedtime., Disp: , Rfl:    dilTIAZem  (CARDIZEM ) 30 mg immediate release tablet, daily as needed. May take  an additional 30 mg tablet daily as needed for palpitations, Disp: , Rfl:    ergocalciferol  (VITAMIN D2) 1,250 mcg (50,000 unit) capsule, Take 50,000 Units by mouth once a week., Disp: , Rfl:    fluticasone  propionate (FLONASE ) 50 mcg/spray nasal spray, 1 spray nightly., Disp: , Rfl:    LORazepam  (ATIVAN) 0.5 mg tablet, daily as needed. take 1/2 to 1 tablet, Disp: , Rfl:    rosuvastatin  (CRESTOR ) 10 mg tablet, Take 10 mg by mouth nightly., Disp: , Rfl:    Xarelto  20 mg tablet, Take 20 mg by mouth daily with dinner., Disp: , Rfl:

## 2024-05-17 ENCOUNTER — Emergency Department (HOSPITAL_BASED_OUTPATIENT_CLINIC_OR_DEPARTMENT_OTHER)
Admission: EM | Admit: 2024-05-17 | Discharge: 2024-05-17 | Disposition: A | Discharge status: Home / Self Care | Source: Home / Self Care | Attending: Emergency Medicine | Admitting: Emergency Medicine

## 2024-05-17 ENCOUNTER — Emergency Department (HOSPITAL_BASED_OUTPATIENT_CLINIC_OR_DEPARTMENT_OTHER)

## 2024-05-17 ENCOUNTER — Telehealth: Payer: Self-pay | Admitting: Primary Care

## 2024-05-17 ENCOUNTER — Other Ambulatory Visit: Payer: Self-pay

## 2024-05-17 ENCOUNTER — Encounter (HOSPITAL_BASED_OUTPATIENT_CLINIC_OR_DEPARTMENT_OTHER): Payer: Self-pay | Admitting: Emergency Medicine

## 2024-05-17 DIAGNOSIS — J189 Pneumonia, unspecified organism: Secondary | ICD-10-CM

## 2024-05-17 DIAGNOSIS — D869 Sarcoidosis, unspecified: Secondary | ICD-10-CM

## 2024-05-17 LAB — CBC
HCT: 40.2 % (ref 36.0–46.0)
Hemoglobin: 12.8 g/dL (ref 12.0–15.0)
MCH: 26.6 pg (ref 26.0–34.0)
MCHC: 31.8 g/dL (ref 30.0–36.0)
MCV: 83.6 fL (ref 80.0–100.0)
Platelets: 321 10*3/uL (ref 150–400)
RBC: 4.81 MIL/uL (ref 3.87–5.11)
RDW: 14.2 % (ref 11.5–15.5)
WBC: 13 10*3/uL — ABNORMAL HIGH (ref 4.0–10.5)
nRBC: 0 % (ref 0.0–0.2)

## 2024-05-17 LAB — BASIC METABOLIC PANEL WITH GFR
Anion gap: 12 (ref 5–15)
BUN: 9 mg/dL (ref 6–20)
CO2: 25 mmol/L (ref 22–32)
Calcium: 9.9 mg/dL (ref 8.9–10.3)
Chloride: 102 mmol/L (ref 98–111)
Creatinine, Ser: 1.26 mg/dL — ABNORMAL HIGH (ref 0.44–1.00)
GFR, Estimated: 50 mL/min — ABNORMAL LOW
Glucose, Bld: 109 mg/dL — ABNORMAL HIGH (ref 70–99)
Potassium: 3.5 mmol/L (ref 3.5–5.1)
Sodium: 139 mmol/L (ref 135–145)

## 2024-05-17 MED ORDER — IOHEXOL 350 MG/ML SOLN
50.0000 mL | Freq: Once | INTRAVENOUS | Status: AC | PRN
  Administered 2024-05-17: 60 mL via INTRAVENOUS

## 2024-05-17 MED ORDER — SODIUM CHLORIDE 0.9 % IV SOLN
1.0000 g | Freq: Once | INTRAVENOUS | Status: AC
  Administered 2024-05-17: 1 g via INTRAVENOUS
  Filled 2024-05-17: qty 10

## 2024-05-17 MED ORDER — ALBUTEROL SULFATE (2.5 MG/3ML) 0.083% IN NEBU
2.5000 mg | INHALATION_SOLUTION | Freq: Once | RESPIRATORY_TRACT | Status: AC
  Administered 2024-05-17: 2.5 mg via RESPIRATORY_TRACT

## 2024-05-17 MED ORDER — AMOXICILLIN-POT CLAVULANATE 875-125 MG PO TABS: 1.0000 | ORAL_TABLET | Freq: Two times a day (BID) | ORAL | 0 refills | Status: AC

## 2024-05-17 MED ORDER — SODIUM CHLORIDE 0.9 % IV SOLN
100.0000 mg | Freq: Once | INTRAVENOUS | Status: AC
  Administered 2024-05-17: 100 mg via INTRAVENOUS
  Filled 2024-05-17: qty 100

## 2024-05-17 MED ORDER — AZITHROMYCIN 250 MG PO TABS: 250.0000 mg | ORAL_TABLET | Freq: Every day | ORAL | 0 refills | Status: AC

## 2024-05-17 MED ORDER — PREDNISONE 10 MG (21) PO TBPK: ORAL_TABLET | Freq: Every day | ORAL | 0 refills | Status: AC

## 2024-05-17 MED ORDER — ALBUTEROL SULFATE (2.5 MG/3ML) 0.083% IN NEBU
INHALATION_SOLUTION | RESPIRATORY_TRACT | Status: AC
  Filled 2024-05-17: qty 3

## 2024-05-17 NOTE — Telephone Encounter (Signed)
 LVM for patient to call and discuss rescheduling the Friday 05/31/24 9:00 am CT chest appointment at [facility]---[facility] is OON with her insurance

## 2024-05-17 NOTE — ED Provider Notes (Signed)
 " [place] [facility]ERGENCY DEPARTMENT AT [facility] Provider Note   CSN: [id number] Arrival date & time: 05/17/24  0906     Patient presents with: Shortness of Breath   Hailey Ross is a 54 y.o. female with past medical history significant for sarcoidosis, bronchiectasis, CKD, previous PE, hypertension, obstructive sleep apnea, history of tobacco use who presents concern for increased work of breathing starting yesterday, patient reports that she had a nosebleed around a week ago, had nasal packing removed yesterday.  She is taking Xarelto , reports she has not missed any doses even through the nosebleed.  She also endorses some left knee pain starting Wednesday without injury.  She reports it hurts all around the knee especially towards the back.  She was worried because last time she had a blood clot she had some significant leg pain for around a week beforehand.  She reports no missed doses of her Xarelto .  She denies any chest pain.  She endorses feeling as though some of the shortness of breath was secondary to her wearing a mask, she reports she was encouraged to wear a mask when outside to prevent cold air from drying out her nose.  She says that it is very difficult to breathe when she is wearing a mask however.    Shortness of Breath      Prior to Admission medications  Medication Sig Start Date End Date Taking? Authorizing Provider  amoxicillin -clavulanate (AUGMENTIN ) 875-125 MG tablet Take 1 tablet by mouth every 12 (twelve) hours. 05/17/24  Yes Feleica Fulmore H, PA-C  azithromycin  (ZITHROMAX ) 250 MG tablet Take 1 tablet (250 mg total) by mouth daily. Take first 2 tablets together, then 1 every day until finished. 05/17/24  Yes Gaylia Kassel H, PA-C  predniSONE  (STERAPRED UNI-PAK 21 TAB) 10 MG (21) TBPK tablet Take by mouth daily. Take 6 tabs by mouth daily  for 2 days, then 5 tabs for 2 days, then 4 tabs for 2 days, then 3 tabs for 2 days, 2 tabs for 2 days, then 1  tab by mouth daily for 2 days 05/17/24  Yes Jeanmarc Viernes H, PA-C  acetaminophen  (TYLENOL ) 500 MG tablet Take 500 mg by mouth every 4 (four) hours as needed for mild pain, moderate pain or headache.     [provider]  albuterol  (VENTOLIN  HFA) 108 (90 Base) MCG/ACT inhaler Inhale 2 puffs into the lungs every 6 (six) hours as needed for wheezing or shortness of breath. 08/11/21   Leath-Warren, Etta PARAS, NP  diltiazem  (CARDIZEM ) 30 MG tablet Take 1 tablet (30 mg total) by mouth daily as needed (For palpitations). May take an additional 30 mg tablet daily as needed for palpitations 10/05/22   Miriam Norris, NP  doxycycline  (VIBRAMYCIN ) 100 MG capsule Take 1 capsule (100 mg total) by mouth 2 (two) times daily as needed with food and water (sun warning). 07/05/23     LORazepam (ATIVAN) 0.5 MG tablet Take 0.5 mg by mouth as needed for anxiety. 01/21/21   [provider]  methotrexate  (RHEUMATREX) 2.5 MG tablet Take 4 tablets (10 mg total) by mouth once a week. Caution:Chemotherapy. Protect from light. 02/05/24   Cheryl Waddell HERO, PA-C  rivaroxaban  (XARELTO ) 20 MG TABS tablet Take 1 tablet (20 mg total) by mouth daily with supper. 01/30/24   Parrett, Madelin RAMAN, NP  rosuvastatin  (CRESTOR ) 5 MG tablet Take 1 tablet (5 mg total) by mouth at bedtime. 10/27/23   Colette Torrence GRADE, MD    Allergies: Patient  has no known allergies.    Review of Systems  Respiratory:  Positive for shortness of breath.   All other systems reviewed and are negative.   Updated Vital Signs BP 131/85   Pulse 81   Temp 98.2 F (36.8 C) (Oral)   Resp 17   LMP 04/01/2021   SpO2 92%   Physical Exam Vitals and nursing note reviewed.  Constitutional:      General: She is not in acute distress.    Appearance: Normal appearance.  HENT:     Head: Normocephalic and atraumatic.  Eyes:     General:        Right eye: No discharge.        Left eye: No discharge.  Cardiovascular:     Rate and Rhythm: Normal rate  and regular rhythm.     Heart sounds: No murmur heard.    No friction rub. No gallop.  Pulmonary:     Effort: Pulmonary effort is normal.     Breath sounds: Normal breath sounds.     Comments: No significant wheezing, rhonchi, stridor, rales, some mild fine crackles at lung bases. Abdominal:     General: Bowel sounds are normal.     Palpations: Abdomen is soft.  Skin:    General: Skin is warm and dry.     Capillary Refill: Capillary refill takes less than 2 seconds.  Neurological:     Mental Status: She is alert and oriented to person, place, and time.  Psychiatric:        Mood and Affect: Mood normal.        Behavior: Behavior normal.     (all labs ordered are listed, but only abnormal results are displayed) Labs Reviewed  CBC - Abnormal; Notable for the following components:      Result Value   WBC 13.0 (*)    All other components within normal limits  BASIC METABOLIC PANEL WITH GFR - Abnormal; Notable for the following components:   Glucose, Bld 109 (*)    Creatinine, Ser 1.26 (*)    GFR, Estimated 50 (*)    All other components within normal limits    EKG: EKG Interpretation Date/Time:  Friday May 17 2024 09:21:07 EST Ventricular Rate:  78 PR Interval:  143 QRS Duration:  101 QT Interval:  386 QTC Calculation: 440 R Axis:   7  Text Interpretation: Sinus rhythm RSR' in V1 or V2, right VCD or RVH since last tracing no significant change Confirmed by Lenor Hollering [phone]) on 05/17/2024 10:05:24 AM  Radiology: CT Angio Chest PE W and/or Wo Contrast Result Date: 05/17/2024 CLINICAL DATA:  One day history of increased work of breathing EXAM: CT ANGIOGRAPHY CHEST WITH CONTRAST TECHNIQUE: Multidetector CT imaging of the chest was performed using the standard protocol during bolus administration of intravenous contrast. Multiplanar CT image reconstructions and MIPs were obtained to evaluate the vascular anatomy. RADIATION DOSE REDUCTION: This exam was performed according  to the departmental dose-optimization program which includes automated exposure control, adjustment of the mA and/or kV according to patient size and/or use of iterative reconstruction technique. CONTRAST:  60mL OMNIPAQUE  IOHEXOL  350 MG/ML SOLN COMPARISON:  CTA chest dated 12/05/2022 FINDINGS: Cardiovascular: The study is high quality for the evaluation of pulmonary embolism. There are no filling defects in the central, lobar, segmental or subsegmental pulmonary artery branches to suggest acute pulmonary embolism. Unchanged ascending thoracic aorta measures 4.3 x 4.2 cm when remeasured. Unchanged dilated main pulmonary artery measures 3.8 cm. Normal  heart size. No significant pericardial fluid/thickening. Aortic atherosclerosis. Mediastinum/Nodes: Imaged thyroid  gland without nodules meeting criteria for imaging follow-up by size. Normal esophagus. Multi station mediastinal and hilar lymphadenopathy, unchanged, for example 11 mm right paratracheal (4:33) and 15 mm right hilar (4:53). Lungs/Pleura: The central airways are patent. Similar upper lobe predominant fibrotic changes with traction bronchiectasis and bronchiolectasis. No focal consolidation. No pneumothorax. No pleural effusion. Upper abdomen: Cholecystectomy. Musculoskeletal: No acute or abnormal lytic or blastic osseous lesions. Review of the MIP images confirms the above findings. IMPRESSION: 1. No evidence of pulmonary embolism. 2. Similar upper lobe predominant fibrotic changes with traction bronchiectasis and bronchiolectasis, likely related to reported history of sarcoidosis. 3. Unchanged multi station mediastinal and hilar lymphadenopathy, likely reactive. 4. Unchanged ascending thoracic aortic aneurysm measuring 4.3 x 4.2 cm when remeasured. Recommend annual imaging followup by CTA or MRA. This recommendation follows 2010 ACCF/AHA/AATS/ACR/ASA/SCA/SCAI/SIR/STS/SVM Guidelines for the Diagnosis and Management of Patients with Thoracic Aortic Disease.  Circulation. 2010; 121: Z733-z630. Aortic aneurysm NOS (ICD10-I71.9) 5. Unchanged dilated main pulmonary artery, which can be seen in the setting of pulmonary arterial hypertension. 6.  Aortic Atherosclerosis (ICD10-I70.0). Electronically Signed   By: Limin  Xu M.D.   On: 05/17/2024 12:08   US  Venous Img Lower  Left (DVT Study) Result Date: 05/17/2024 CLINICAL DATA:  Left leg pain for 2 days. History of previous pulmonary embolism in a patient currently on anticoagulation therapy with Xarelto . EXAM: Left LOWER EXTREMITY VENOUS DOPPLER ULTRASOUND TECHNIQUE: Gray-scale sonography with compression, as well as color and duplex ultrasound, were performed to evaluate the deep venous system(s) from the level of the common femoral vein through the popliteal and proximal calf veins. COMPARISON:  None Available. FINDINGS: VENOUS Normal compressibility of the common femoral, superficial femoral, and popliteal veins, as well as the visualized calf veins. Visualized portions of profunda femoral vein and great saphenous vein unremarkable. No filling defects to suggest DVT on grayscale or color Doppler imaging. Doppler waveforms show normal direction of venous flow, normal respiratory plasticity and response to augmentation. Limited views of the contralateral common femoral vein are unremarkable. OTHER None. Limitations: none IMPRESSION: Negative for left lower extremity DVT. Electronically Signed   By: Cordella Banner   On: 05/17/2024 10:27   DG Chest Portable 1 View Result Date: 05/17/2024 CLINICAL DATA:  Shortness of breath EXAM: PORTABLE CHEST 1 VIEW COMPARISON:  Chest radiograph dated 06/12/2023 FINDINGS: Mildly low lung volumes. Similar diffuse reticular opacities. Slightly increased patchy left retrocardiac opacity. No pleural effusion or pneumothorax. Enlarged cardiomediastinal silhouette. No acute osseous abnormality. IMPRESSION: 1. Slightly increased patchy left retrocardiac opacity, which may represent atelectasis,  aspiration, or pneumonia. 2. Similar diffuse reticular opacities, likely reflecting pulmonary fibrosis. 3. Cardiomegaly. Electronically Signed   By: Limin  Xu M.D.   On: 05/17/2024 10:26     Procedures   Medications Ordered in the ED  albuterol  (PROVENTIL ) (2.5 MG/3ML) 0.083% nebulizer solution (  Canceled Entry 05/17/24 0925)  doxycycline  (VIBRAMYCIN ) 100 mg in sodium chloride  0.9 % 250 mL IVPB (100 mg Intravenous New Bag/Given 05/17/24 1118)  albuterol  (PROVENTIL ) (2.5 MG/3ML) 0.083% nebulizer solution 2.5 mg (2.5 mg Nebulization Given 05/17/24 0917)  cefTRIAXone  (ROCEPHIN ) 1 g in sodium chloride  0.9 % 100 mL IVPB (0 g Intravenous Stopped 05/17/24 1120)  iohexol  (OMNIPAQUE ) 350 MG/ML injection 50 mL (60 mLs Intravenous Contrast Given 05/17/24 1058)  Medical Decision Making Amount and/or Complexity of Data Reviewed Labs: ordered. Radiology: ordered.  Risk Prescription drug management.   This patient is a 54 y.o. female  who presents to the ED for concern of shortness of breath.   Differential diagnoses prior to evaluation: The emergent differential diagnosis includes, but is not limited to,  asthma exacerbation, COPD exacerbation, acute upper respiratory infection, acute bronchitis, chronic bronchitis, interstitial lung disease, ARDS, PE, pneumonia, atypical ACS, carbon monoxide poisoning, spontaneous pneumothorax, new CHF vs CHF exacerbation, versus other . This is not an exhaustive differential.   Past Medical History / Co-morbidities / Social History: sarcoidosis, bronchiectasis, CKD, previous PE, hypertension, obstructive sleep apnea, history of tobacco use  Additional history: Chart reviewed. Pertinent results include: Reviewed previous lab work, imaging from emergency department visits, outpatient pulmonology visits.  Physical Exam: Physical exam performed. The pertinent findings include: No significant wheezing, rhonchi, stridor, rales, some mild  fine crackles at lung bases.  Initially with hypoxia, oxygen  saturation 83% on room air, improved with 3 L nasal cannula, during her stay after 1 breathing treatment patient was able to titrate off of oxygen , oxygen  saturation around 92% on room air.  Lab Tests/Imaging studies: I personally interpreted labs/imaging and the pertinent results include: CBC with mild leukocytosis, white blood cell 13.0, BMP with mildly elevated creatinine, 1.26, otherwise overall unremarkable.  Plain film chest x-ray shows  1. Slightly increased patchy left retrocardiac opacity, which may  represent atelectasis, aspiration, or pneumonia.  2. Similar diffuse reticular opacities, likely reflecting pulmonary  fibrosis.  3. Cardiomegaly.  DVT study negative. CT angio PE study shows 1. No evidence of pulmonary embolism.  2. Similar upper lobe predominant fibrotic changes with traction  bronchiectasis and bronchiolectasis, likely related to reported  history of sarcoidosis.  3. Unchanged multi station mediastinal and hilar lymphadenopathy,  likely reactive.  4. Unchanged ascending thoracic aortic aneurysm measuring 4.3 x 4.2  cm when remeasured. Recommend annual imaging followup by CTA or MRA.  This recommendation follows 2010  ACCF/AHA/AATS/ACR/ASA/SCA/SCAI/SIR/STS/SVM Guidelines for the  Diagnosis and Management of Patients with Thoracic Aortic Disease.  Circulation. 2010; 121: [id number]. Aortic aneurysm NOS (ICD10-I71.9)  5. Unchanged dilated main pulmonary artery, which can be seen in the  setting of pulmonary arterial hypertension.  6.  Aortic Atherosclerosis (ICD10-I70.0).  I agree with the radiologist interpretation.  Cardiac monitoring: EKG obtained and interpreted by myself and attending physician which shows: Normal sinus rhythm, no acute ST-T changes, no significant change from last tracing   Medications: I ordered medication including albuterol  x 1, Rocephin , doxycycline  for questionable pneumonia  on initial chest x-ray.  Since patient is able to tolerate return to room air, and passed ambulatory pulse ox with normal oxygen  saturation, feel that she is likely stable for discharge, plan to discharge with antibiotics, steroids.  I have reviewed the patients home medicines and have made adjustments as needed.   Disposition: After consideration of the diagnostic results and the patients response to treatment, I feel that patient with flare of sarcoidosis, with possible overlying pneumonia, will plan to treat with antibiotics, steroids, and close follow-up with her pulmonologist.   emergency department workup does not suggest an emergent condition requiring admission or immediate intervention beyond what has been performed at this time. The plan is: as above. The patient is safe for discharge and has been instructed to return immediately for worsening symptoms, change in symptoms or any other concerns.    Final diagnoses:  Sarcoidosis  Pneumonia due to infectious organism,  unspecified laterality, unspecified part of lung    ED Discharge Orders          Ordered    predniSONE  (STERAPRED UNI-PAK 21 TAB) 10 MG (21) TBPK tablet  Daily        05/17/24 1237    amoxicillin -clavulanate (AUGMENTIN ) 875-125 MG tablet  Every 12 hours        05/17/24 1237    azithromycin  (ZITHROMAX ) 250 MG tablet  Daily        05/17/24 1237               Zahria Ding, Piffard H, PA-C 05/17/24 1237    Lenor Hollering, MD 05/17/24 1422  "

## 2024-05-17 NOTE — ED Notes (Signed)

## 2024-05-17 NOTE — ED Notes (Signed)
 Pt ambulated from room to down the hallway and back to room. Pt's sp02 dropped to 93%

## 2024-05-17 NOTE — ED Triage Notes (Signed)
 Reports increased work of breathing starting yesterday. Hx of sarcoidosis.   Had nasal packing removed yesterday for nose bleed, Taking Xarelto .   Also c/o R knee pain starting Wednesday, Denies injury.   83% on RA

## 2024-05-17 NOTE — Discharge Instructions (Addendum)
 Please take the entire course of antibiotics, steroids that we have prescribed, follow-up closely with your pulmonologist, primary care doctor.  It was a pleasure taking care of you today.  These return if you have worsening shortness of breath, develop chest pain, fever, chills despite taking medications as above.

## 2024-05-21 ENCOUNTER — Inpatient Hospital Stay: Admitting: Family Medicine

## 2024-05-27 ENCOUNTER — Ambulatory Visit: Admitting: Gastroenterology

## 2024-05-31 ENCOUNTER — Ambulatory Visit (HOSPITAL_BASED_OUTPATIENT_CLINIC_OR_DEPARTMENT_OTHER)

## 2024-06-24 ENCOUNTER — Ambulatory Visit: Admitting: Primary Care

## 2024-07-01 ENCOUNTER — Ambulatory Visit: Admitting: Physician Assistant

## 2024-07-02 ENCOUNTER — Ambulatory Visit: Admitting: Physician Assistant
